# Patient Record
Sex: Male | Born: 1945 | Race: White | Hispanic: No | Smoking: Former smoker
Health system: Southern US, Community
[De-identification: ages and names within clinical notes are randomized; demographics above are authoritative.]

## PROBLEM LIST (undated history)

## (undated) DIAGNOSIS — F419 Anxiety disorder, unspecified: Secondary | ICD-10-CM

## (undated) DIAGNOSIS — E785 Hyperlipidemia, unspecified: Secondary | ICD-10-CM

## (undated) DIAGNOSIS — I739 Peripheral vascular disease, unspecified: Secondary | ICD-10-CM

## (undated) DIAGNOSIS — G629 Polyneuropathy, unspecified: Secondary | ICD-10-CM

## (undated) DIAGNOSIS — F1910 Other psychoactive substance abuse, uncomplicated: Secondary | ICD-10-CM

## (undated) DIAGNOSIS — I10 Essential (primary) hypertension: Secondary | ICD-10-CM

## (undated) DIAGNOSIS — L03115 Cellulitis of right lower limb: Secondary | ICD-10-CM

## (undated) DIAGNOSIS — J449 Chronic obstructive pulmonary disease, unspecified: Secondary | ICD-10-CM

## (undated) DIAGNOSIS — M545 Low back pain: Secondary | ICD-10-CM

## (undated) DIAGNOSIS — M109 Gout, unspecified: Secondary | ICD-10-CM

## (undated) DIAGNOSIS — D492 Neoplasm of unspecified behavior of bone, soft tissue, and skin: Secondary | ICD-10-CM

## (undated) DIAGNOSIS — IMO0001 Reserved for inherently not codable concepts without codable children: Secondary | ICD-10-CM

## (undated) DIAGNOSIS — K219 Gastro-esophageal reflux disease without esophagitis: Secondary | ICD-10-CM

## (undated) DIAGNOSIS — K759 Inflammatory liver disease, unspecified: Secondary | ICD-10-CM

## (undated) DIAGNOSIS — L03116 Cellulitis of left lower limb: Secondary | ICD-10-CM

## (undated) DIAGNOSIS — G8929 Other chronic pain: Secondary | ICD-10-CM

## (undated) DIAGNOSIS — J302 Other seasonal allergic rhinitis: Secondary | ICD-10-CM

## (undated) DIAGNOSIS — E119 Type 2 diabetes mellitus without complications: Secondary | ICD-10-CM

## (undated) DIAGNOSIS — R911 Solitary pulmonary nodule: Secondary | ICD-10-CM

## (undated) HISTORY — DX: Chronic obstructive pulmonary disease, unspecified: J44.9

## (undated) HISTORY — DX: Gastro-esophageal reflux disease without esophagitis: K21.9

## (undated) HISTORY — DX: Cellulitis of right lower limb: L03.115

## (undated) HISTORY — DX: Hyperlipidemia, unspecified: E78.5

## (undated) HISTORY — DX: Essential (primary) hypertension: I10

## (undated) HISTORY — DX: Other psychoactive substance abuse, uncomplicated: F19.10

## (undated) HISTORY — DX: Peripheral vascular disease, unspecified: I73.9

---

## 1965-10-27 DIAGNOSIS — K759 Inflammatory liver disease, unspecified: Secondary | ICD-10-CM

## 1965-10-27 HISTORY — DX: Inflammatory liver disease, unspecified: K75.9

## 1999-11-06 ENCOUNTER — Ambulatory Visit (HOSPITAL_COMMUNITY): Admission: RE | Admit: 1999-11-06 | Discharge: 1999-11-06 | Payer: Self-pay | Admitting: Internal Medicine

## 2000-01-06 ENCOUNTER — Emergency Department (HOSPITAL_COMMUNITY): Admission: EM | Admit: 2000-01-06 | Discharge: 2000-01-06 | Payer: Self-pay | Admitting: Emergency Medicine

## 2000-01-06 ENCOUNTER — Encounter: Payer: Self-pay | Admitting: Emergency Medicine

## 2000-10-27 DIAGNOSIS — E119 Type 2 diabetes mellitus without complications: Secondary | ICD-10-CM

## 2000-10-27 HISTORY — DX: Type 2 diabetes mellitus without complications: E11.9

## 2000-12-16 ENCOUNTER — Encounter: Admission: RE | Admit: 2000-12-16 | Discharge: 2001-01-29 | Payer: Self-pay | Admitting: Family Medicine

## 2001-06-23 ENCOUNTER — Ambulatory Visit (HOSPITAL_COMMUNITY): Admission: RE | Admit: 2001-06-23 | Discharge: 2001-06-23 | Payer: Self-pay | Admitting: Family Medicine

## 2001-09-07 ENCOUNTER — Ambulatory Visit (HOSPITAL_COMMUNITY): Admission: RE | Admit: 2001-09-07 | Discharge: 2001-09-07 | Payer: Self-pay | Admitting: *Deleted

## 2001-09-07 ENCOUNTER — Encounter: Payer: Self-pay | Admitting: *Deleted

## 2001-09-26 ENCOUNTER — Encounter: Payer: Self-pay | Admitting: *Deleted

## 2001-09-26 ENCOUNTER — Ambulatory Visit (HOSPITAL_COMMUNITY): Admission: RE | Admit: 2001-09-26 | Discharge: 2001-09-26 | Payer: Self-pay | Admitting: *Deleted

## 2002-03-22 DIAGNOSIS — E785 Hyperlipidemia, unspecified: Secondary | ICD-10-CM

## 2004-04-09 ENCOUNTER — Ambulatory Visit (HOSPITAL_COMMUNITY): Admission: RE | Admit: 2004-04-09 | Discharge: 2004-04-09 | Payer: Self-pay | Admitting: Internal Medicine

## 2004-07-29 ENCOUNTER — Ambulatory Visit: Payer: Self-pay | Admitting: *Deleted

## 2004-08-27 ENCOUNTER — Ambulatory Visit: Payer: Self-pay | Admitting: Family Medicine

## 2004-09-03 ENCOUNTER — Ambulatory Visit: Payer: Self-pay | Admitting: Family Medicine

## 2004-12-25 ENCOUNTER — Ambulatory Visit: Payer: Self-pay | Admitting: Family Medicine

## 2005-01-01 ENCOUNTER — Ambulatory Visit: Payer: Self-pay | Admitting: Family Medicine

## 2005-01-07 ENCOUNTER — Ambulatory Visit: Payer: Self-pay | Admitting: Family Medicine

## 2005-01-21 ENCOUNTER — Ambulatory Visit: Payer: Self-pay | Admitting: Family Medicine

## 2005-02-10 ENCOUNTER — Ambulatory Visit: Payer: Self-pay | Admitting: Family Medicine

## 2005-06-16 ENCOUNTER — Ambulatory Visit: Payer: Self-pay | Admitting: Family Medicine

## 2005-06-16 DIAGNOSIS — M545 Low back pain: Secondary | ICD-10-CM

## 2005-06-16 DIAGNOSIS — G8929 Other chronic pain: Secondary | ICD-10-CM | POA: Insufficient documentation

## 2005-09-04 ENCOUNTER — Ambulatory Visit: Payer: Self-pay | Admitting: Family Medicine

## 2005-11-05 ENCOUNTER — Ambulatory Visit: Payer: Self-pay | Admitting: Family Medicine

## 2006-02-09 ENCOUNTER — Ambulatory Visit: Payer: Self-pay | Admitting: Family Medicine

## 2006-03-02 ENCOUNTER — Ambulatory Visit: Payer: Self-pay | Admitting: Family Medicine

## 2006-05-20 ENCOUNTER — Ambulatory Visit: Payer: Self-pay | Admitting: Internal Medicine

## 2006-07-09 ENCOUNTER — Ambulatory Visit: Payer: Self-pay | Admitting: Family Medicine

## 2006-07-23 ENCOUNTER — Ambulatory Visit: Payer: Self-pay | Admitting: Family Medicine

## 2006-12-17 ENCOUNTER — Ambulatory Visit: Payer: Self-pay | Admitting: Family Medicine

## 2007-03-15 ENCOUNTER — Ambulatory Visit: Payer: Self-pay | Admitting: Family Medicine

## 2007-05-08 DIAGNOSIS — E118 Type 2 diabetes mellitus with unspecified complications: Secondary | ICD-10-CM

## 2007-05-08 DIAGNOSIS — I10 Essential (primary) hypertension: Secondary | ICD-10-CM

## 2007-05-08 DIAGNOSIS — M48061 Spinal stenosis, lumbar region without neurogenic claudication: Secondary | ICD-10-CM

## 2007-05-08 DIAGNOSIS — Z77090 Contact with and (suspected) exposure to asbestos: Secondary | ICD-10-CM | POA: Insufficient documentation

## 2007-05-08 DIAGNOSIS — E119 Type 2 diabetes mellitus without complications: Secondary | ICD-10-CM | POA: Insufficient documentation

## 2007-05-08 DIAGNOSIS — IMO0002 Reserved for concepts with insufficient information to code with codable children: Secondary | ICD-10-CM | POA: Insufficient documentation

## 2007-05-08 DIAGNOSIS — E669 Obesity, unspecified: Secondary | ICD-10-CM | POA: Insufficient documentation

## 2007-05-18 DIAGNOSIS — M109 Gout, unspecified: Secondary | ICD-10-CM

## 2007-06-03 ENCOUNTER — Ambulatory Visit: Payer: Self-pay | Admitting: Family Medicine

## 2007-08-03 ENCOUNTER — Ambulatory Visit: Payer: Self-pay | Admitting: Nurse Practitioner

## 2007-08-03 DIAGNOSIS — J449 Chronic obstructive pulmonary disease, unspecified: Secondary | ICD-10-CM | POA: Insufficient documentation

## 2007-08-03 LAB — CONVERTED CEMR LAB
Blood Glucose, Fingerstick: 155
Hgb A1c MFr Bld: 5.8 %

## 2007-10-11 ENCOUNTER — Telehealth (INDEPENDENT_AMBULATORY_CARE_PROVIDER_SITE_OTHER): Payer: Self-pay | Admitting: Family Medicine

## 2007-10-13 ENCOUNTER — Telehealth (INDEPENDENT_AMBULATORY_CARE_PROVIDER_SITE_OTHER): Payer: Self-pay | Admitting: Family Medicine

## 2007-10-19 ENCOUNTER — Telehealth (INDEPENDENT_AMBULATORY_CARE_PROVIDER_SITE_OTHER): Payer: Self-pay | Admitting: Family Medicine

## 2008-01-20 ENCOUNTER — Ambulatory Visit: Payer: Self-pay | Admitting: Family Medicine

## 2008-01-20 LAB — CONVERTED CEMR LAB
ALT: 37 units/L (ref 0–53)
AST: 33 units/L (ref 0–37)
BUN: 13 mg/dL (ref 6–23)
Basophils Absolute: 0 10*3/uL (ref 0.0–0.1)
Basophils Relative: 0 % (ref 0–1)
CO2: 29 meq/L (ref 19–32)
Cholesterol: 133 mg/dL (ref 0–200)
Creatinine, Ser: 0.85 mg/dL (ref 0.40–1.50)
Eosinophils Relative: 1 % (ref 0–5)
HCT: 43.3 % (ref 39.0–52.0)
HDL: 43 mg/dL (ref >39)
Hemoglobin: 14.6 g/dL (ref 13.0–17.0)
MCHC: 33.7 g/dL (ref 30.0–36.0)
Monocytes Absolute: 0.9 10*3/uL (ref 0.1–1.0)
RDW: 13.8 % (ref 11.5–15.5)
Total Bilirubin: 0.8 mg/dL (ref 0.3–1.2)
Total CHOL/HDL Ratio: 3.1
VLDL: 19 mg/dL (ref 0–40)

## 2008-01-24 ENCOUNTER — Encounter (INDEPENDENT_AMBULATORY_CARE_PROVIDER_SITE_OTHER): Payer: Self-pay | Admitting: Family Medicine

## 2008-01-31 ENCOUNTER — Ambulatory Visit: Payer: Self-pay | Admitting: Family Medicine

## 2008-01-31 DIAGNOSIS — K219 Gastro-esophageal reflux disease without esophagitis: Secondary | ICD-10-CM

## 2008-01-31 LAB — CONVERTED CEMR LAB: Blood Glucose, Fingerstick: 183

## 2008-03-31 ENCOUNTER — Ambulatory Visit: Payer: Self-pay | Admitting: Family Medicine

## 2008-03-31 DIAGNOSIS — L719 Rosacea, unspecified: Secondary | ICD-10-CM

## 2008-03-31 DIAGNOSIS — E1140 Type 2 diabetes mellitus with diabetic neuropathy, unspecified: Secondary | ICD-10-CM | POA: Insufficient documentation

## 2008-03-31 DIAGNOSIS — F1020 Alcohol dependence, uncomplicated: Secondary | ICD-10-CM | POA: Insufficient documentation

## 2008-04-10 ENCOUNTER — Encounter: Payer: Self-pay | Admitting: Family Medicine

## 2008-05-05 ENCOUNTER — Encounter: Payer: Self-pay | Admitting: Family Medicine

## 2008-05-12 ENCOUNTER — Ambulatory Visit: Payer: Self-pay | Admitting: Family Medicine

## 2008-05-12 ENCOUNTER — Encounter: Payer: Self-pay | Admitting: *Deleted

## 2008-05-15 ENCOUNTER — Ambulatory Visit: Payer: Self-pay | Admitting: Family Medicine

## 2008-07-18 ENCOUNTER — Telehealth: Payer: Self-pay | Admitting: *Deleted

## 2008-07-31 ENCOUNTER — Encounter (INDEPENDENT_AMBULATORY_CARE_PROVIDER_SITE_OTHER): Payer: Self-pay | Admitting: *Deleted

## 2008-07-31 ENCOUNTER — Ambulatory Visit: Payer: Self-pay | Admitting: Family Medicine

## 2008-08-14 ENCOUNTER — Ambulatory Visit: Payer: Self-pay | Admitting: Family Medicine

## 2008-09-12 ENCOUNTER — Telehealth: Payer: Self-pay | Admitting: Family Medicine

## 2008-09-14 ENCOUNTER — Telehealth (INDEPENDENT_AMBULATORY_CARE_PROVIDER_SITE_OTHER): Payer: Self-pay | Admitting: *Deleted

## 2008-10-17 ENCOUNTER — Telehealth: Payer: Self-pay | Admitting: *Deleted

## 2009-01-09 ENCOUNTER — Telehealth: Payer: Self-pay | Admitting: Family Medicine

## 2009-03-05 ENCOUNTER — Encounter: Payer: Self-pay | Admitting: Family Medicine

## 2009-03-29 ENCOUNTER — Ambulatory Visit: Payer: Self-pay | Admitting: Family Medicine

## 2009-05-30 ENCOUNTER — Ambulatory Visit: Payer: Self-pay | Admitting: Family Medicine

## 2009-05-30 ENCOUNTER — Encounter: Payer: Self-pay | Admitting: Family Medicine

## 2009-05-30 LAB — CONVERTED CEMR LAB
ALT: 33 units/L (ref 0–53)
AST: 37 units/L (ref 0–37)
Albumin: 4.5 g/dL (ref 3.5–5.2)
Alkaline Phosphatase: 58 units/L (ref 39–117)
BUN: 7 mg/dL (ref 6–23)
CO2: 24 meq/L (ref 19–32)
Calcium: 9 mg/dL (ref 8.4–10.5)
Chloride: 99 meq/L (ref 96–112)
Cholesterol: 155 mg/dL (ref 0–200)
Creatinine, Ser: 0.74 mg/dL (ref 0.40–1.50)
Glucose, Bld: 154 mg/dL — ABNORMAL HIGH (ref 70–99)
HDL: 41 mg/dL (ref >39)
LDL Cholesterol: 91 mg/dL (ref 0–99)
Potassium: 4.5 meq/L (ref 3.5–5.3)
Sodium: 140 meq/L (ref 135–145)
Total Bilirubin: 0.5 mg/dL (ref 0.3–1.2)
Total CHOL/HDL Ratio: 3.8
Total Protein: 7.1 g/dL (ref 6.0–8.3)
Triglycerides: 115 mg/dL (ref <150)
VLDL: 23 mg/dL (ref 0–40)

## 2009-05-31 ENCOUNTER — Encounter: Payer: Self-pay | Admitting: Family Medicine

## 2009-06-06 ENCOUNTER — Telehealth: Payer: Self-pay | Admitting: Family Medicine

## 2009-07-16 ENCOUNTER — Encounter: Payer: Self-pay | Admitting: Family Medicine

## 2009-09-13 ENCOUNTER — Encounter: Payer: Self-pay | Admitting: Family Medicine

## 2009-09-14 ENCOUNTER — Ambulatory Visit: Payer: Self-pay | Admitting: Family Medicine

## 2009-09-14 ENCOUNTER — Encounter: Payer: Self-pay | Admitting: Family Medicine

## 2009-09-14 LAB — CONVERTED CEMR LAB: PSA: 1.38 ng/mL (ref 0.10–4.00)

## 2009-09-16 ENCOUNTER — Encounter: Payer: Self-pay | Admitting: Family Medicine

## 2009-12-25 ENCOUNTER — Encounter: Payer: Self-pay | Admitting: Family Medicine

## 2010-01-16 ENCOUNTER — Encounter: Payer: Self-pay | Admitting: Family Medicine

## 2010-01-16 ENCOUNTER — Ambulatory Visit: Payer: Self-pay | Admitting: Family Medicine

## 2010-03-18 ENCOUNTER — Telehealth: Payer: Self-pay | Admitting: Family Medicine

## 2010-05-14 ENCOUNTER — Encounter: Payer: Self-pay | Admitting: Family Medicine

## 2010-06-27 ENCOUNTER — Ambulatory Visit: Payer: Self-pay | Admitting: Family Medicine

## 2010-06-27 LAB — CONVERTED CEMR LAB: Hgb A1c MFr Bld: 7.7 %

## 2010-08-19 ENCOUNTER — Telehealth: Payer: Self-pay | Admitting: Family Medicine

## 2010-09-17 ENCOUNTER — Ambulatory Visit: Payer: Self-pay | Admitting: Family Medicine

## 2010-09-17 ENCOUNTER — Encounter: Payer: Self-pay | Admitting: Family Medicine

## 2010-09-17 LAB — CONVERTED CEMR LAB
Albumin/Creatinine Ratio, Urine, POC: ABNORMAL
Creatinine,U: 50 mg/dL
Hgb A1c MFr Bld: 8.4 %
Microalbumin U total vol: 10 mg/L

## 2010-11-21 ENCOUNTER — Encounter (INDEPENDENT_AMBULATORY_CARE_PROVIDER_SITE_OTHER): Payer: Self-pay | Admitting: *Deleted

## 2010-11-26 NOTE — Assessment & Plan Note (Signed)
Summary: f/u,df   Vital Signs:  Patient profile:   65 year old male Height:      65 inches Weight:      229 pounds BMI:     38.25 Temp:     98.0 degrees F oral Pulse rate:   69 / minute BP sitting:   145 / 79  (left arm) Cuff size:   regular  Vitals Entered By: Levert Feinstein LPN (November 22, 624THL 1:42 PM) CC: f/u dm Is Patient Diabetic? Yes Did you bring your meter with you today? No Pain Assessment Patient in pain? no        Primary Couper [place]:  Boykin Nearing MD  CC:  f/u dm.  History of Present Illness: Here to f/u diabetes. Does not check CBGs, has not had a meter for last 2-3 years.  Taking all medications as prescribed.   DM- Taking metformin and glipizide as prescribed.  ROS: polyuria 2/2 diuretic use. Admits to chest pain yesterday, L side under L breast, lasting 20 minutes, sharp, no N/V, dizziness or feeling lightheaded, no sweating.  Denies polydipsia, fatigue, blurred vision, tingling or numbess in hand and feet.   HTN- BPs elevated today. Pt taking medications as prescribed. No pain currently. No CP, HA, SOB, blurred vision.   Obesity- 229 lbs. Weight trending up. Pt rather sedentary. He spends most of his time at the bar. He drinls 8 (12 oz.) beers/day, 5 days/wk. He reports drinking light beer. His only exercise is walking to and from the parking lot. He often skips dinner b/c of lack of appetite. He has been eating cookies quite often lately.    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 1 Two Times A Day 2)  Glipizide 10 Mg  Tabs (Glipizide) .... Take 1 Tablet By Mouth Every Morning 3)  Allopurinol 300 Mg  Tabs (Allopurinol) .... Once Daily 4)  Furosemide 20 Mg  Tabs (Furosemide) .... Take 1 Tablet By Mouth Once A Day 5)  Simvastatin 80 Mg Tabs (Simvastatin) .Marland Kitchen.. 1 Tab By Mouth Daily For Cholesterol 6)  Norvasc 10 Mg Tabs (Amlodipine Besylate) .... One Tab Daily. 7)  Hydrocodone-Acetaminophen 10-500 Mg  Tabs (Hydrocodone-Acetaminophen) .Marland Kitchen.. 1 Tab By Mouth Three Times A Day As Needed For Back Pain. 8)  Ventolin Hfa 108 (90 Base) Mcg/act Aers (Albuterol Sulfate) .... 2 Puffs Every 4 Hours As Needed 9)  Advair Diskus 250-50 Mcg/dose  Misc (Fluticasone-Salmeterol) .Marland Kitchen.. 1 Puff Two Times A Day 10)  Metoprolol Tartrate 100 Mg Tabs (Metoprolol Tartrate) .... Take 2 Tablets By Mouth Two Times A Day 11)  Nexium 40 Mg Cpdr (Esomeprazole Magnesium) .Marland Kitchen.. 1 Tab By Mouth Daily 12)  Losartan Potassium 50 Mg Tabs (Losartan Potassium) .Marland Kitchen.. 1 By Mouth Daily For Blood Pressure.  Take Instead of The Avapro  Allergies: No Known Drug Allergies  Social History: Customer service manager. Widowed. 5-8 years education Rents home, owns a car  >6 beers/day Likes to fish Pets:  4 cockatiels and 2 lovebirds  Review of Systems       As per HPI  Physical Exam  General:  A&Ox3, NAD, obese, pleasant Lungs:  decreased air movement, wheezes throughout.  Heart:  normal rate and regular rhythm.    Diabetes Management Exam:    Foot Exam (with socks and/or shoes not present):       Sensory-Pinprick/Light touch:          Left medial foot (L-4): diminished           Left dorsal foot (L-5): normal          Left lateral foot (S-1): normal          Right medial foot (L-4): diminished          Right dorsal foot (L-5): normal          Right lateral foot (S-1): normal       Sensory-Monofilament:          Left foot: diminished          Right foot: diminished       Inspection:          Left foot: normal          Right foot: normal       Nails:          Left foot: thickened          Right foot: thickened    Impression & Recommendations:  Problem # 1:  DM W/COMPLICATION NOS, TYPE II (ICD-250.90) Assessment Deteriorated A1c elevated and trending up. Discussed the need to start insulin if trend continues. Pt strongly wants to avoid insulin. See pt instructions for plan. Referral to Ophthalmology and Podiatry. His updated medication list for this problem includes:    Metformin Hcl 1000 Mg Tabs (Metformin hcl) .Marland Kitchen... 1 two times a day    Glipizide 10 Mg Tabs (Glipizide) .Marland Kitchen... Take 1 tablet by mouth every morning    Losartan Potassium 50 Mg Tabs (Losartan potassium) .Marland Kitchen... 1 by mouth daily for blood pressure.  take instead of the avapro    Aspirin 81 Mg Tbec (Aspirin) ..... One tab q day  Orders: A1C-[facility] [id number]) UA Microalbumin-[facility] [id number]) Creatinine-[facility] [id number]) TSH-[facility] [id number]) [place]- Est  Level 4 [id number]) Podiatry Referral (Podiatry) Ophthalmology Referral (Ophthalmology)Future Orders: Lipid-[facility] [id number]) ... 08/28/2011  Problem # 2:  HYPERTENSION (ICD-401.9) Assessment: Deteriorated Elevated today compared to previous recent visits.  Do not plan to change medications today. See pt instructions for plan, lifestyle modification with diet and exercise.  His updated medication list for this problem includes:    Furosemide 20 Mg Tabs (Furosemide) .Marland Kitchen... Take 1 tablet by mouth once a day    Norvasc 10 Mg Tabs (Amlodipine besylate) ..... One tab daily.    Metoprolol Tartrate 100 Mg Tabs (Metoprolol tartrate) .Marland Kitchen... Take 2 tablets by mouth  two times a day    Losartan Potassium 50 Mg Tabs (Losartan potassium) .Marland Kitchen... 1 by mouth daily for blood pressure.  take instead of the avapro  Orders: [place]- Est  Level 4 [id number])  Problem # 3:  OBESITY (ICD-278.00) See pt  instructions for plan. Lifestyle modifications (diet changes, increase activity). Also encouraged pt to schedule an appointment with Dr. Jenne Campus.  Orders: [place]- Est  Level 4 [id number])  Complete Medication List: 1)  Metformin Hcl 1000 Mg Tabs (Metformin hcl) .Marland Kitchen.. 1 two times a day 2)  Glipizide 10 Mg Tabs (Glipizide) .... Take 1 tablet by mouth every morning 3)  Allopurinol 300 Mg Tabs (Allopurinol) .... Once daily 4)  Furosemide 20 Mg Tabs (Furosemide) .... Take 1 tablet by mouth once a day 5)  Simvastatin 80 Mg Tabs (Simvastatin) .Marland Kitchen.. 1 tab by mouth daily for cholesterol 6)  Norvasc 10 Mg Tabs (Amlodipine besylate) .... One tab daily. 7)  Hydrocodone-acetaminophen 10-500 Mg Tabs (Hydrocodone-acetaminophen) .Marland Kitchen.. 1 tab by mouth three times a day as needed for back pain. 8)  Ventolin Hfa 108 (90 Base) Mcg/act Aers (Albuterol sulfate) .... 2 puffs every 4 hours as needed 9)  Advair Diskus 250-50 Mcg/dose Misc (Fluticasone-salmeterol) .Marland Kitchen.. 1 puff two times a day 10)  Metoprolol Tartrate 100 Mg Tabs (Metoprolol tartrate) .... Take 2 tablets by mouth two times a day 11)  Nexium 40 Mg Cpdr (Esomeprazole magnesium) .Marland Kitchen.. 1 tab by mouth daily 12)  Losartan Potassium 50 Mg Tabs (Losartan potassium) .Marland Kitchen.. 1 by mouth daily for blood pressure.  take instead of the avapro 13)  Aspirin 81 Mg Tbec (Aspirin) .... One tab q day  Other Orders: Flu Vaccine 89yrs + [id number]) Admin 1st Vaccine [id number])  Patient Instructions: 1)  Mr. Torell. 2)  Thank you for coming in today. 3)  It was good to see you again. 4)  I will refill your medications today. 5)  For yout diabetes control: 6)  1. cut out cookies: 7)  2. Increase activity:  8)  I know you want to avoid insulin. To do that you have to  continue to take your medications as prescribed, eat regular (3-4 meals), increase activity. 9)  I believe you will benefit from seeing the excellent nutritionist here, Freddrick March please schedule an appointment with her when you leave. 10)  Referrals: Ophthalmology to check your eyes. Podiatry to check your feet. You will be called with appointment times.  11)  Please schedule a follow-up appointment in 2 months. 12)  Have a happy Holiday, 13)  Dr. [name] Blackwater    Orders Added: 1)  A1C-[facility] [83036] 2)  Lipid-[facility] [80061-22930] 3)  UA Microalbumin-[facility] [82044] 4)  Creatinine-[facility] [id number] 5)  TSH-[facility] [id number] 6)  Flu Vaccine 68yrs + [id number] 7)  Admin 1st Vaccine [90471] 8)  [place]- Est  Level 4 [id number] 9)  Podiatry Referral [Podiatry] 10)  Ophthalmology Referral [Ophthalmology]   Immunizations Administered:  Influenza Vaccine # 1:    Vaccine Type: Fluvax 3+    Site: left deltoid    Mfr: GlaxoSmithKline    Dose: 0.5 ml    Route: IM    Given by: Levert Feinstein LPN    Exp. Date: 04/23/2011    Lot #: [id number]    VIS given: 05/21/10 version given September 17, 2010.  Flu Vaccine Consent Questions:    Do you have a history of severe allergic reactions to this vaccine? no    Any prior history of allergic reactions to egg and/or gelatin? no    Do you have a sensitivity to the preservative Thimersol? no    Do you have a past history of Guillan-Barre Syndrome? no    Do you currently have an acute febrile illness? no    Have you ever had a  severe reaction to latex? no    Vaccine information given and explained to patient? yes   Immunizations Administered:  Influenza Vaccine # 1:    Vaccine Type: Fluvax 3+    Site: left deltoid    Mfr: GlaxoSmithKline    Dose: 0.5 ml    Route: IM    Given by: Levert Feinstein LPN    Exp. Date: 04/23/2011    Lot #: [id number]    VIS given: 05/21/10 version given September 17, 2010.  Laboratory Results   Urine Tests  Date/Time Received: September 17, 2010 2:46 PM  Date/Time Reported: September 17, 2010 3:22 PM   Microalbumin (urine): 10 mg/L Creatinine: 50mg /dL  A:C Ratio 30-300 Abnormal Comments: ...........test performed by...........Marland KitchenHedy Camara, CMA   Blood Tests   Date/Time Received: September 17, 2010 1:34 PM  Date/Time Reported: September 17, 2010 1:54 PM   HGBA1C: 8.4%   (Normal Range: Non-Diabetic - 3-6%   Control Diabetic - 6-8%)  Comments: ...........test performed by...........Marland KitchenLevert Feinstein, LPN entered by Hedy Camara, CMA

## 2010-11-26 NOTE — Progress Notes (Signed)
Summary: refill  Phone Note Refill Request Call back at Home Phone [phone] Message from:  Patient  Refills Requested: Medication #1:  HYDROCODONE-ACETAMINOPHEN 10-500 MG  TABS 1 tab by mouth three times a day as needed for back pain. Initial call taken by: Audie Clear,  August 19, 2010 10:33 AM    Prescriptions: HYDROCODONE-ACETAMINOPHEN 10-500 MG  TABS (HYDROCODONE-ACETAMINOPHEN) 1 tab by mouth three times a day as needed for back pain.  #90 x 3   Entered by:   Marcell Barlow RN   Authorized by:   Boykin Nearing MD   Signed by:   Marcell Barlow RN on 08/21/2010   Method used:   Telephoned to ...       [facility] (retail)       [place], [place]  [id number]       Ph: [phone]       Fax: [phone]   [id number]  paged Dr. [name] Blackwater and she gave ok to fill this medication for patient . he came to office today to ask about refill. RX called to pharmacy. patient has appointment scheduled 09/17/2010 with Dr. [name] Blackwater. Marcell Barlow RN  August 21, 2010 12:30 PM

## 2010-11-26 NOTE — Assessment & Plan Note (Signed)
Summary: f/u meds/eo   Vital Signs:  Patient profile:   65 year old male Height:      65 inches Weight:      227 pounds BMI:     37.91 BSA:     2.09 Temp:     98.3 degrees F Pulse rate:   69 / minute BP sitting:   115 / 70  Vitals Entered By: Christen Bame CMA (January 16, 2010 8:35 AM) CC: f/u meds Is Patient Diabetic? Yes Did you bring your meter with you today? No Pain Assessment Patient in pain? no        Primary Care Provider:  Graciella Belton MD  CC:  f/u meds.  History of Present Illness: 65 yo male here for f/u of pain management and:  PAIN MANAGEMENT.  Takes Vicodin three times a day chronically.  Pain is well controlled on this regimen, though he does assert that pain is worse in the morning when he wakes up.  Currently 0/10.  DM.  Taking meds as prescribed.  No polydipsia, some polyuria.  Historically well-controlled.  HTN / HLD.  No chest pain, dyspnea, LE edema.  Taking meds as prescribed.  GOUT.  No c/o.  COPD.  Wheezes a little in the morning, otherwise well.  Takes Advair as prescribed.  Stopped using Spiriva.  Habits & Providers  Alcohol-Tobacco-Diet     Tobacco Status: never  Allergies (verified): No Known Drug Allergies  Review of Systems       Per HPI.  Physical Exam  Additional Exam:  VITALS:  Reviewed, normal GEN: Alert & oriented, no acute distress, obese CARDIO: Regular rate and rhythm, no murmurs/rubs/gallops, 2+ bilateral radial pulses RESP: Mild bilateral wheeze, normal work of breathing, no retractions/accessory muscle use EXT: Nontender, no edema    Impression & Recommendations:  Problem # 1:  LOW BACK PAIN (ICD-724.2) Assessment Improved  Meds refilled today.  Pain contract signed today. His updated medication list for this problem includes:    Hydrocodone-acetaminophen 10-500 Mg Tabs (Hydrocodone-acetaminophen) .Marland Kitchen... 1 tab by mouth three times a day as needed for back pain.  Orders: [place]- Est  Level 4  [id number])  Problem # 2:  DM W/COMPLICATION NOS, TYPE II (ICD-250.90) Assessment: Improved Check A1c today. His updated medication list for this problem includes:    Metformin Hcl 1000 Mg Tabs (Metformin hcl) .Marland Kitchen... 1 two times a day    Glipizide 10 Mg Tabs (Glipizide) .Marland Kitchen... Take 1 tablet by mouth every morning    Avapro 150 Mg Tabs (Irbesartan) .Marland Kitchen... Take 1 tablet by mouth every morning  Orders: A1C-[facility] [id number]) [place]- Est  Level 4 [id number])  Labs Reviewed: Creat: 0.74 (05/30/2009)   Microalbumin: 1+ (08/14/2008)  Last Eye Exam: normal (04/10/2008) Reviewed HgBA1c results: 6.6 (09/14/2009)  6.2 (03/29/2009)  Problem # 3:  HYPERLIPIDEMIA (ICD-272.4) Assessment: Unchanged  His updated medication list for this problem includes:    Simvastatin 80 Mg Tabs (Simvastatin) .Marland Kitchen... 1 tab by mouth daily for cholesterol  Problem # 4:  COPD (ICD-496) Assessment: Improved  The following medications were removed from the medication list:    Spiriva Handihaler 18 Mcg Caps (Tiotropium bromide monohydrate) .Marland Kitchen... 1 capsule  once a day His updated medication list for this problem includes:    Ventolin Hfa 108 (90 Base) Mcg/act Aers (Albuterol sulfate) .Marland Kitchen... 2 puffs every 4 hours as needed    Advair Diskus 250-50 Mcg/dose Misc (Fluticasone-salmeterol) .Marland Kitchen... 1 puff two times a day  Problem # 5:  GOUT (ICD-274.9) Assessment:  Improved  No symptoms. His updated medication list for this problem includes:    Allopurinol 300 Mg Tabs (Allopurinol) ..... Once daily  Orders: [place]- Est  Level 4 [id number])  Complete Medication List: 1)  Metformin Hcl 1000 Mg Tabs (Metformin hcl) .Marland Kitchen.. 1 two times a day 2)  Glipizide 10 Mg Tabs (Glipizide) .... Take 1 tablet by mouth every morning 3)  Allopurinol 300 Mg Tabs (Allopurinol) .... Once daily 4)  Furosemide 20 Mg Tabs (Furosemide) .... Take 1 tablet by mouth once a day 5)  Avapro 150 Mg Tabs (Irbesartan) .... Take 1 tablet by mouth every morning 6)  Simvastatin 80 Mg  Tabs (Simvastatin) .Marland Kitchen.. 1 tab by mouth daily for cholesterol 7)  Norvasc 5 Mg Tabs (Amlodipine besylate) .... Take 1 tablet by mouth once a day 8)  Hydrocodone-acetaminophen 10-500 Mg Tabs (Hydrocodone-acetaminophen) .Marland Kitchen.. 1 tab by mouth three times a day as needed for back pain. 9)  Ventolin Hfa 108 (90 Base) Mcg/act Aers (Albuterol sulfate) .... 2 puffs every 4 hours as needed 10)  Advair Diskus 250-50 Mcg/dose Misc (Fluticasone-salmeterol) .Marland Kitchen.. 1 puff two times a day 11)  Metoprolol Tartrate 100 Mg Tabs (Metoprolol tartrate) .... Take 2 tablets by mouth two times a day 12)  Nexium 40 Mg Cpdr (Esomeprazole magnesium) .Marland Kitchen.. 1 tab by mouth daily  Patient Instructions: 1)  Pleasure to see you today. 2)  I've refilled your pain medications today. 3)  Please schedule a follow-up appointment in 3 months .  Prescriptions: HYDROCODONE-ACETAMINOPHEN 10-500 MG  TABS (HYDROCODONE-ACETAMINOPHEN) 1 tab by mouth three times a day as needed for back pain.  #90 x 3   Entered and Authorized by:   Graciella Belton MD   Signed by:   Graciella Belton MD on 01/16/2010   Method used:   Print then Give to Patient   RxID:   [id number] ADVAIR DISKUS 250-50 MCG/DOSE  MISC (FLUTICASONE-SALMETEROL) 1 puff two times a day  #60 x 3   Entered and Authorized by:   Graciella Belton MD   Signed by:   Graciella Belton MD on 01/16/2010   Method used:   Electronically to        [facility] (retail)       [place], [place]  [id number]       Ph: [phone]       Fax: [phone]   [id number] VENTOLIN HFA 108 (90 BASE) MCG/ACT AERS (ALBUTEROL SULFATE) 2 puffs every 4 hours as needed  #1 x 3   Entered and Authorized by:   Graciella Belton MD   Signed by:   Graciella Belton MD on 01/16/2010   Method used:   Electronically to        [facility] (retail)       [address]       [place], [place]  [id number]       Ph: [phone]       Fax:  [phone]   RxID:   [id number]   Appended Document: A1c  7.2%    Lab Visit  Laboratory Results   Blood Tests   Date/Time Received: January 16, 2010 8:31 AM  Date/Time Reported: January 16, 2010 9:17 AM   HGBA1C: 7.2%   (Normal Range: Non-Diabetic - 3-6%   Control Diabetic - 6-8%)  Comments: ............test performed by...........Marland Kitchen Hedy Camara, CMA .............entered by...........Marland KitchenBonnie A. Martinique, MLS (ASCP)cm     Orders  Today:   Appended Document: f/u meds/eo    Prescriptions: METFORMIN HCL 1000 MG  TABS (METFORMIN HCL) 1 two times a day  #60 x 0   Entered by:   Elige Radon RN   Authorized by:   Boykin Nearing MD   Signed by:   Elige Radon RN on 06/12/2010   Method used:   Electronically to        [facility] (retail)       [place], [place]  [id number]       Ph: [phone]       Fax: [phone]   RxID:   [id number] GLIPIZIDE 10 MG  TABS (GLIPIZIDE) Take 1 tablet by mouth every morning  #30 Tablet x 0   Entered by:   Elige Radon RN   Authorized by:   Boykin Nearing MD   Signed by:   Elige Radon RN on 06/12/2010   Method used:   Electronically to        [facility] (retail)       [place], [place]  [id number]       Ph: [phone]       Fax: [phone]   RxID:   [id number] ALLOPURINOL 300 MG  TABS (ALLOPURINOL) once daily  #30 Tablet x 0   Entered by:   Elige Radon RN   Authorized by:   Boykin Nearing MD   Signed by:   Elige Radon RN on 06/12/2010   Method used:   Electronically to        [facility] (retail)       [place], [place]  [id number]       Ph: [phone]       Fax: [phone]   RxID:   [id number]

## 2010-11-26 NOTE — Miscellaneous (Signed)
Summary: [place] Controlled Substance Contract  [place] Controlled Substance Contract   Imported By: Raymond Gurney 01/17/2010 11:08:56  _____________________________________________________________________  External Attachment:    Type:   Image     Comment:   External Document

## 2010-11-26 NOTE — Progress Notes (Signed)
Summary: refill  Phone Note Refill Request Call back at Home Phone [phone] Message from:  Patient  Refills Requested: Medication #1:  NEXIUM 40 MG CPDR 1 tab by mouth daily. Initial call taken by: Audie Clear,  Mar 18, 2010 9:05 AM    Prescriptions: NEXIUM 40 MG CPDR (ESOMEPRAZOLE MAGNESIUM) 1 tab by mouth daily  #30 x 1   Entered and Authorized by:   Graciella Belton MD   Signed by:   Graciella Belton MD on 03/19/2010   Method used:   Electronically to        [facility] (retail)       [place], [place]  [id number]       Ph: [phone]       Fax: [phone]   RxID:   [id number]

## 2010-11-26 NOTE — Letter (Signed)
Summary: Pain Contract Letter  [facility]  [address]   [place], [place] [address]   Phone: [phone]  Fax: [phone]    12/25/2009  Leslie [name] [address] [place], [place]  [address]  Dear Mr. Fauth,  I will need to see you in clinic again before I refill your pain medicine.  I have a policy of having all of my patients who are on chronic narcotic pain medicines to sign a pain contract.  We will need to do this at your next visit.  No more refills until seen in clinic and we sign a pain contract.  Sincerely, Graciella Belton MD  Appended Document: Pain Contract Letter mailed.

## 2010-11-26 NOTE — Miscellaneous (Signed)
Summary: irbesartan changed to losartan  Clinical Lists Changes Changed med from irbesartan to losartan for generic availability.  Sarah Martinique MD  May 14, 2010 12:33 PM  Medications: Removed medication of AVAPRO 150 MG  TABS (IRBESARTAN) Take 1 tablet by mouth every morning Added new medication of LOSARTAN POTASSIUM 50 MG TABS (LOSARTAN POTASSIUM) 1 by mouth daily for blood pressure.  Take instead of the avapro - Signed Rx of LOSARTAN POTASSIUM 50 MG TABS (LOSARTAN POTASSIUM) 1 by mouth daily for blood pressure.  Take instead of the avapro;  #90 x 0;  Signed;  Entered by: Sarah Martinique MD;  Authorized by: Sarah Martinique MD;  Method used: Electronically to [facility] [place], [place], [place]  [id number], Ph: [phone], Fax: [phone]    Prescriptions: LOSARTAN POTASSIUM 50 MG TABS (LOSARTAN POTASSIUM) 1 by mouth daily for blood pressure.  Take instead of the avapro  #90 x 0   Entered and Authorized by:   Sarah Martinique MD   Signed by:   Sarah Martinique MD on 05/14/2010   Method used:   Electronically to        [facility] (retail)       [address]       [place], [place]  [id number]       Ph: [phone]       Fax: [phone]   RxID:   [id number]

## 2010-11-26 NOTE — Assessment & Plan Note (Signed)
Summary: follow up/ls   Vital Signs:  Patient profile:   65 year old male Height:      65 inches Weight:      224 pounds BMI:     37.41 BSA:     2.08 Temp:     98.7 degrees F Pulse rate:   71 / minute BP sitting:   149 / 75  Vitals Entered By: Christen Bame CMA (June 27, 2010 1:37 PM)  Serial Vital Signs/Assessments:  Time      Position  BP       Pulse  Resp  Temp     By                     159/90                         Boykin Nearing MD  CC: Meet NEw MD Is Patient Diabetic? Yes Did you bring your meter with you today? No Pain Assessment Patient in pain? no        Primary Provider:  Graciella Belton MD  CC:  Meet NEw MD.  History of Present Illness: Mr. Hanko is here for his prescription refills and to discuss his diabetes. He has no complaints.   We reviewed his prescriptions. He takes them all as presribed.  Regarding his diabetes, he does not monitor his blood sugar at home. He tries to maintain a well- balance diet. He does not eat fried foods. 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 He walks for exercise, but is limited by the pain in his back and hips. We discussed exercises he can do while sitting. He has neuropathy in his lower extremities. He has decreased sensation in his feet b/l. He trims his toe nails. We discussed him checking his feet over a mirror to look for sores. He denies change in vision. He also denies N/V,  and GI pain.   Regarding his BP. He took his BP medication this AM. He denis HA,  chest pain, dizziness, SOB. He does not monitor BPs at home.   Regarding hyprlipidemia. As previously stated, he has cut out fried foods. We discussed the benefits of exercising and maintaining a low fat diet. He denies visual disturbances and chest pain.  Regarding his chronic pain. He takes Percocet one tab 3x/day. His back and hip pain limits his mobility. He utilizes the Financial trader when shopping at [employer]. We discussed the importance of hip girdle  exercises. I demonstrated some exercises I would like him to do while holding on to a chair.    Habits & Providers  Alcohol-Tobacco-Diet     Alcohol drinks/day: 4+     Alcohol type: beer     Tobacco Status: quit     Year Quit: 2003     Passive Smoke Exposure: no  Exercise-Depression-Behavior     Exercise Counseling: to improve exercise regimen  Allergies: No Known Drug Allergies  Social History: Smoking Status:  quit  Review of Systems       As per HPI.  Physical Exam  General:  Well-developed,well-nourished,in no acute distress; alert,appropriate and cooperative throughout examination Nose:  external deformity.  bulbous lesion on hanging from R nares.  Lungs:  decreased air movement, wheezes throughout.  Heart:  normal rate and regular rhythm.   Abdomen:  abdomen soft and non-tender without masses, organomegaly or hernias noted. Extremities:  small  38mm warts on dorsal surface of b/l  feet. No redness or swelling noted. Nontender. All toes b/l feet with  onychomycosis.  No macertation between toes. No ulcers or sores on soles of feet. 2+ dp pulses.  No edema of foot.  No streaking up foot.     Impression & Recommendations:  Problem # 1:  DM W/COMPLICATION NOS, TYPE II (ICD-250.90)  His updated medication list for this problem includes:    Metformin Hcl 1000 Mg Tabs (Metformin hcl) .Marland Kitchen... 1 two times a day    Glipizide 10 Mg Tabs (Glipizide) .Marland Kitchen... Take 1 tablet by mouth every morning    Losartan Potassium 50 Mg Tabs (Losartan potassium) .Marland Kitchen... 1 by mouth daily for blood pressure.  take instead of the avapro  Orders: A1C-[facility] (83036)Future Orders: Lipid-[facility] [id number]) ... 06/28/2011  Problem # 2:  HYPERLIPIDEMIA (ICD-272.4)  His updated medication list for this problem includes:    Simvastatin 80 Mg Tabs (Simvastatin) .Marland Kitchen... 1 tab by mouth daily for cholesterol  Orders: [place]- Est  Level 4 (99214)Future Orders: Lipid-[facility] [id number]) ... 06/28/2011 Comp Met-[facility]  [id number]) ... 06/28/2011  Problem # 3:  HYPERTENSION (ICD-401.9)  His updated medication list for this problem includes:    Furosemide 20 Mg Tabs (Furosemide) .Marland Kitchen... Take 1 tablet by mouth once a day    Norvasc 5 Mg Tabs (Amlodipine besylate) .Marland Kitchen... Take 1 tablet by mouth once a day    Metoprolol Tartrate 100 Mg Tabs (Metoprolol tartrate) .Marland Kitchen... Take 2 tablets by mouth two times a day    Losartan Potassium 50 Mg Tabs (Losartan potassium) .Marland Kitchen... 1 by mouth daily for blood pressure.  take instead of the avapro  Orders: [facility]- Est  Level 4 (99214)Future Orders: Lipid-[facility] [id number]) ... 06/28/2011  Problem # 4:  SMOKELESS TOBACCO ABUSE (ICD-305.1) Assessment: Improved Pt quit smokeless tobacco 6 mos ago.   Problem # 5:  LOW BACK PAIN (ICD-724.2)  His updated medication list for this problem includes:    Hydrocodone-acetaminophen 10-500 Mg Tabs (Hydrocodone-acetaminophen) .Marland Kitchen... 1 tab by mouth three times a day as needed for back pain.  We will discuss his low back pain in more detail at his next appointment. At that time we will explore alternative pain control options.   Orders: [place]- Est  Level 4 [id number])  Complete Medication List: 1)  Metformin Hcl 1000 Mg Tabs (Metformin hcl) .Marland Kitchen.. 1 two times a day 2)  Glipizide 10 Mg Tabs (Glipizide) .... Take 1 tablet by mouth every morning 3)  Allopurinol 300 Mg Tabs (Allopurinol) .... Once daily 4)  Furosemide 20 Mg Tabs (Furosemide) .... Take 1 tablet by mouth once a day 5)  Simvastatin 80 Mg Tabs (Simvastatin) .Marland Kitchen.. 1 tab by mouth daily for cholesterol 6)  Norvasc 5 Mg Tabs (Amlodipine besylate) .... Take 1 tablet by mouth once a day 7)  Hydrocodone-acetaminophen 10-500 Mg Tabs (Hydrocodone-acetaminophen) .Marland Kitchen.. 1 tab by mouth three times a day as needed for back pain. 8)  Ventolin Hfa 108 (90 Base) Mcg/act Aers (Albuterol sulfate) .... 2 puffs every 4 hours as needed 9)  Advair Diskus 250-50 Mcg/dose Misc (Fluticasone-salmeterol) .Marland Kitchen.. 1 puff  two times a day 10)  Metoprolol Tartrate 100 Mg Tabs (Metoprolol tartrate) .... Take 2 tablets by mouth two times a day 11)  Nexium 40 Mg Cpdr (Esomeprazole magnesium) .Marland Kitchen.. 1 tab by mouth daily 12)  Losartan Potassium 50 Mg Tabs (Losartan potassium) .Marland Kitchen.. 1 by mouth daily for blood pressure.  take instead of the avapro  Patient Instructions: 1)  Leslie [name]. It was a pleasure meeting  you today. 2)  Thank you for coming in. 3)  I will refill your medications today. 4)  Please schedule a follow-up appointment in 2 months. 5)  Please stop the 5 mg Amlodipine and take the 10 mg Amlodipine.  6)  Please return for a FASTING Lipid Profile and CMP in 8 weeks. 7)  Check your Blood Pressure regularly. If it is above:140/90 you should make an appointment. 8)  Check your feet each night for sore areas, calluses or signs of infection. 9)  It is important that you exercise regularly at least 20 minutes 5 times a week. If you develop chest pain, have severe difficulty breathing, or feel very tired , stop exercising immediately and seek medical attention. Prescriptions: LOSARTAN POTASSIUM 50 MG TABS (LOSARTAN POTASSIUM) 1 by mouth daily for blood pressure.  Take instead of the avapro  #90 x 3   Entered and Authorized by:   Boykin Nearing MD   Signed by:   Boykin Nearing MD on 06/27/2010   Method used:   Electronically to        [facility] (retail)       [place], [place]  [id number]       Ph: [phone]       Fax: [phone]   [id number] NEXIUM 40 MG CPDR (ESOMEPRAZOLE MAGNESIUM) 1 tab by mouth daily  #30 Capsule x 3   Entered and Authorized by:   Boykin Nearing MD   Signed by:   Boykin Nearing MD on 06/27/2010   Method used:   Electronically to        [facility] (retail)       [place], [place]  [id number]       Ph: [phone]       Fax: [phone]   [id number] METOPROLOL  TARTRATE 100 MG TABS (METOPROLOL TARTRATE) Take 2 tablets by mouth two times a day  #120 Tablet x 3   Entered and Authorized by:   Boykin Nearing MD   Signed by:   Boykin Nearing MD on 06/27/2010   Method used:   Electronically to        [facility] (retail)       [place], [place]  [id number]       Ph: [phone]       Fax: [phone]   [id number] SIMVASTATIN 80 MG TABS (SIMVASTATIN) 1 tab by mouth daily for cholesterol  #30 x 3   Entered and Authorized by:   Boykin Nearing MD   Signed by:   Boykin Nearing MD on 06/27/2010   Method used:   Electronically to        [facility] (retail)       [place], [place]  [id number]       Ph: [phone]       Fax: [phone]   [id number] FUROSEMIDE 20 MG  TABS (FUROSEMIDE) Take 1 tablet by mouth once a day  #34 Tablet x 3   Entered and Authorized by:   Boykin Nearing MD   Signed by:   Boykin Nearing MD on 06/27/2010   Method used:   Electronically to  [facility] (retail)       [place]       [place], [place]  [id number]       Ph: [phone]       Fax: [phone]   RxID:   [id number] ALLOPURINOL 300 MG  TABS (ALLOPURINOL) once daily  #30 Tablet x 3   Entered and Authorized by:   Boykin Nearing MD   Signed by:   Boykin Nearing MD on 06/27/2010   Method used:   Electronically to        [facility] (retail)       [place], [place]  [id number]       Ph: [phone]       Fax: [phone]   [id number] GLIPIZIDE 10 MG  TABS (GLIPIZIDE) Take 1 tablet by mouth every morning  #30 Tablet x 3   Entered and Authorized by:   Boykin Nearing MD   Signed by:   Boykin Nearing MD on 06/27/2010   Method used:   Electronically to        [facility] (retail)       [place],  [place]  [id number]       Ph: [phone]       Fax: [phone]   [id number] METFORMIN HCL 1000 MG  TABS (METFORMIN HCL) 1 two times a day  #60 x 3   Entered and Authorized by:   Boykin Nearing MD   Signed by:   Boykin Nearing MD on 06/27/2010   Method used:   Electronically to        [facility] (retail)       [place], [place]  [id number]       Ph: [phone]       Fax: [phone]   RxID:   [id number]   Prevention & Chronic Care Immunizations   Influenza vaccine: Fluvax 3+  (09/14/2009)   Influenza vaccine due: 09/04/2006    Tetanus booster: 07/09/2006: Historical   Tetanus booster due: 07/09/2016    Pneumococcal vaccine: Historical  (07/09/2006)   Pneumococcal vaccine due: None    H. zoster vaccine: Not documented  Colorectal Screening   Hemoccult: normal  (08/25/2008)   Hemoccult action/deferral: Not indicated  (06/27/2010)   Hemoccult due: 08/25/2009    Colonoscopy: hemoccult  (08/25/2008)   Colonoscopy action/deferral: Refused  (09/14/2009)   Colonoscopy due: Not Indicated  Other Screening   PSA: 1.38  (09/14/2009)   Smoking status: quit  (06/27/2010)  Diabetes Mellitus   HgbA1C: 7.7  (06/27/2010)   Hemoglobin A1C due: 11/14/2008    Eye exam: normal  (04/10/2008)   Eye exam due: 04/10/2009    Foot exam: Not documented   High risk foot: Not documented   Foot care education: Not documented    Urine microalbumin/creatinine ratio: Not documented   Urine microalbumin/cr due: 08/14/2009  Lipids   Total Cholesterol: 155  (05/30/2009)   LDL: 91  (05/30/2009)   LDL Direct: Not documented   HDL: 41  (05/30/2009)   Triglycerides: 115  (05/30/2009)    SGOT (AST): 37  (05/30/2009)   SGPT (ALT): 33  (05/30/2009) CMP ordered    Alkaline phosphatase: 58  (05/30/2009)   Total bilirubin: 0.5  (05/30/2009)  Hypertension   Last  Blood Pressure: 149 / 75  (06/27/2010)   Serum creatinine: 0.74   (05/30/2009)   Serum potassium 4.5  (05/30/2009) CMP ordered   Self-Management Support :   Personal Goals (by the next clinic visit) :     Personal A1C goal: 7  (09/14/2009)     Personal blood pressure goal: 140/90  (09/14/2009)     Personal LDL goal: 70  (09/14/2009)    Patient will work on the following items until the next clinic visit to reach self-care goals:     Medications and monitoring: take my medicines every day, check my blood pressure, bring all of my medications to every visit, examine my feet every day  (06/27/2010)     Eating: drink diet soda or water instead of juice or soda, eat fruit for snacks and desserts, limit or avoid alcohol  (06/27/2010)     Activity: take a 30 minute walk every day  (06/27/2010)    Diabetes self-management support: Not documented    Hypertension self-management support: Not documented    Lipid self-management support: Not documented     Lipid self-management support not done because: Good outcomes  (09/14/2009)   Nursing Instructions: Diabetic foot exam today    Laboratory Results   Blood Tests   Date/Time Received: June 27, 2010 1:48 PM  Date/Time Reported: June 27, 2010 2:30 PM   HGBA1C: 7.7%   (Normal Range: Non-Diabetic - 3-6%   Control Diabetic - 6-8%)  Comments: ...........test performed by...........Marland KitchenHedy Camara, CMA

## 2010-11-28 NOTE — Letter (Signed)
Summary: Generic Letter  [facility]  [address]   [place], [place] [address]   Phone: [phone]  Fax: [phone]    11/21/2010  [address] [place], [place]  [address]  Dear Mr. Kluttz,  We are happy to let you know that since you are covered under Medicare you are able to have a FREE Welcome to Medicare visit at the [facility] to discuss your HEALTH. There will be no co-payment.  At this visit you will meet with your doctor and Lamont Dowdy an expert in wellness and the health coach at our clinic.  At this visit we will discuss ways to keep you healthy and feeling well.  This visit will not replace your regular doctor visit and we cannot refill medications.     You will need to plan to be here at least one hour to talk about your medical history, your current status, review all of your medications, and discuss your future plans for your health.  This information will be entered into your record for your doctor to have and review.  If you are interested in staying healthy, this type of visit can help.  Please call the office at: [phone], to schedule a "Medicare Wellness Visit".  The day of the visit you should bring in all of your medications, including any vitamins, herbs, over the counter products you take.  Make a list of all the other doctors that you see, so we know who they are. If you have any other health documents please bring them.  We look forward to helping you stay healthy.  Sincerely,   Suzanne Lineberry Tina

## 2010-12-13 ENCOUNTER — Telehealth: Payer: Self-pay | Admitting: Family Medicine

## 2010-12-13 MED ORDER — HYDROCODONE-ACETAMINOPHEN 10-500 MG PO TABS: 1.0000 | ORAL_TABLET | Freq: Three times a day (TID) | ORAL | Status: DC | PRN

## 2010-12-13 NOTE — Telephone Encounter (Signed)
Spoke with pt. States meds are now being mailed to him every 3 months. He got 7 rx. Metoprolol is for 90 days. All the rest are for 60 days. Did not get the

## 2010-12-13 NOTE — Telephone Encounter (Signed)
Simvastatin, losartan  & needs refill on hydrocodone. Wants sent locally to [facility] on [address]. He will call the pharmacy & tell them what he needs. Will ask preceptor about refill on the pain med.

## 2010-12-13 NOTE — Telephone Encounter (Signed)
Per old EMR received 90 tabs with 3 refills 12/2009.  OK to refill.

## 2010-12-17 ENCOUNTER — Encounter: Payer: Self-pay | Admitting: Family Medicine

## 2010-12-17 ENCOUNTER — Ambulatory Visit (INDEPENDENT_AMBULATORY_CARE_PROVIDER_SITE_OTHER): Payer: Medicare HMO | Admitting: Family Medicine

## 2010-12-17 VITALS — BP 128/75 | HR 74 | Temp 98.3°F | Ht 64.0 in | Wt 223.9 lb

## 2010-12-17 DIAGNOSIS — M545 Low back pain: Secondary | ICD-10-CM

## 2010-12-17 DIAGNOSIS — E118 Type 2 diabetes mellitus with unspecified complications: Secondary | ICD-10-CM

## 2010-12-17 DIAGNOSIS — E785 Hyperlipidemia, unspecified: Secondary | ICD-10-CM

## 2010-12-17 LAB — POCT GLYCOSYLATED HEMOGLOBIN (HGB A1C): Hemoglobin A1C: 6.9

## 2010-12-17 MED ORDER — SIMVASTATIN 80 MG PO TABS: 80.0000 mg | ORAL_TABLET | Freq: Every day | ORAL | Status: DC

## 2010-12-17 MED ORDER — HYDROCODONE-ACETAMINOPHEN 10-500 MG PO TABS: 1.0000 | ORAL_TABLET | Freq: Three times a day (TID) | ORAL | Status: DC | PRN

## 2010-12-17 NOTE — Progress Notes (Signed)
  Subjective:    Patient ID: Leslie [name], male    DOB: Mar 13, 1946, 65 y.o.   MRN: [id number]  Hyperlipidemia This is a chronic problem. The current episode started more than 1 year ago. The problem is controlled. Recent lipid tests were reviewed and are normal. Pertinent negatives include no chest pain, focal weakness, leg pain, myalgias or shortness of breath. Current antihyperlipidemic treatment includes statins. There are no compliance problems.   Back Pain This is a chronic problem. The current episode started more than 1 year ago. The problem is unchanged. The pain is moderate. Pertinent negatives include no chest pain or leg pain. He has tried analgesics for the symptoms. The treatment provided moderate relief.      Review of Systems  Respiratory: Negative for shortness of breath.   Cardiovascular: Negative for chest pain.  Musculoskeletal: Positive for back pain. Negative for myalgias.  Neurological: Negative for focal weakness.       Objective:   Physical Exam  Constitutional:       Obese   Cardiovascular: Normal rate and regular rhythm.   Pulmonary/Chest: Effort normal and breath sounds normal.  Musculoskeletal:       Low back: limited ROM 2/2 pain          Assessment & Plan:

## 2010-12-17 NOTE — Assessment & Plan Note (Signed)
Unchanged.  Controlled with current medications.  Continue Lortab.

## 2010-12-17 NOTE — Assessment & Plan Note (Signed)
LDL at goal.  Pt may be able to decrease Simvastatin dose or switch to Lipitor.  Will defer to PCP.

## 2011-01-23 ENCOUNTER — Other Ambulatory Visit: Payer: Self-pay | Admitting: Family Medicine

## 2011-01-23 ENCOUNTER — Other Ambulatory Visit: Payer: Self-pay | Admitting: *Deleted

## 2011-01-23 DIAGNOSIS — E785 Hyperlipidemia, unspecified: Secondary | ICD-10-CM

## 2011-01-23 MED ORDER — ESOMEPRAZOLE MAGNESIUM 40 MG PO CPDR: 40.0000 mg | DELAYED_RELEASE_CAPSULE | Freq: Every day | ORAL | Status: DC

## 2011-01-23 MED ORDER — LOSARTAN POTASSIUM 50 MG PO TABS: 50.0000 mg | ORAL_TABLET | Freq: Every day | ORAL | Status: DC

## 2011-01-23 MED ORDER — SIMVASTATIN 80 MG PO TABS: 80.0000 mg | ORAL_TABLET | Freq: Every day | ORAL | Status: DC

## 2011-01-23 MED ORDER — FUROSEMIDE 20 MG PO TABS: 20.0000 mg | ORAL_TABLET | Freq: Every day | ORAL | Status: DC

## 2011-01-23 MED ORDER — GLIPIZIDE 10 MG PO TABS: 10.0000 mg | ORAL_TABLET | ORAL | Status: DC

## 2011-01-23 MED ORDER — AMLODIPINE BESYLATE 10 MG PO TABS: 10.0000 mg | ORAL_TABLET | Freq: Every day | ORAL | Status: DC

## 2011-01-23 MED ORDER — ALBUTEROL SULFATE HFA 108 (90 BASE) MCG/ACT IN AERS: 2.0000 | INHALATION_SPRAY | RESPIRATORY_TRACT | Status: DC | PRN

## 2011-01-23 MED ORDER — METFORMIN HCL 1000 MG PO TABS: 1000.0000 mg | ORAL_TABLET | Freq: Two times a day (BID) | ORAL | Status: DC

## 2011-01-23 MED ORDER — METOPROLOL TARTRATE 100 MG PO TABS: 200.0000 mg | ORAL_TABLET | Freq: Two times a day (BID) | ORAL | Status: DC

## 2011-01-23 MED ORDER — ALLOPURINOL 300 MG PO TABS: 300.0000 mg | ORAL_TABLET | Freq: Every day | ORAL | Status: DC

## 2011-01-23 NOTE — Telephone Encounter (Signed)
Patient calling because he has not received his mail order rx yet.  Paged Dr. [name] Blackwater and she has refill request in her bag and will send in today. Patient needs meds for a least a week to get him thru until mail order received. rx called to  [facility] for amlodipine, simvastatin, glipizide, furosemide and nexium

## 2011-01-23 NOTE — Telephone Encounter (Signed)
Addended by: Marcell Barlow on: 01/23/2011 12:24 PM   Modules accepted: Orders

## 2011-02-03 ENCOUNTER — Ambulatory Visit (INDEPENDENT_AMBULATORY_CARE_PROVIDER_SITE_OTHER): Payer: Medicare HMO | Admitting: Family Medicine

## 2011-02-03 ENCOUNTER — Encounter: Payer: Self-pay | Admitting: Family Medicine

## 2011-02-03 VITALS — BP 140/80 | HR 68 | Wt 225.3 lb

## 2011-02-03 DIAGNOSIS — M545 Low back pain, unspecified: Secondary | ICD-10-CM

## 2011-02-03 DIAGNOSIS — E118 Type 2 diabetes mellitus with unspecified complications: Secondary | ICD-10-CM

## 2011-02-03 DIAGNOSIS — E785 Hyperlipidemia, unspecified: Secondary | ICD-10-CM

## 2011-02-03 DIAGNOSIS — I10 Essential (primary) hypertension: Secondary | ICD-10-CM

## 2011-02-03 MED ORDER — HYDROCODONE-ACETAMINOPHEN 10-500 MG PO TABS: 1.0000 | ORAL_TABLET | Freq: Three times a day (TID) | ORAL | Status: DC | PRN

## 2011-02-03 NOTE — Assessment & Plan Note (Addendum)
Pt compliant with medications. No myositis, Will check FLP next visit. If pt doing well will decrease simvastatin to 40.

## 2011-02-03 NOTE — Assessment & Plan Note (Signed)
Pt compliant with medications. A A1c trending down at last assessment. Will check in Early may.  Continue current regimen

## 2011-02-03 NOTE — Assessment & Plan Note (Signed)
Well controlled.  Continue to encourage weight loss.

## 2011-02-03 NOTE — Progress Notes (Signed)
  Subjective:    Patient ID: Leslie [name], male    DOB: 1945/10/29, 65 y.o.   MRN: [id number]  HPI 65 yo M here to discuss medications. Needs meds refilled. Ran out of simvistatin last week. All meds except lortab [phone] with a 3 mos supply. Meds have been called in. Pt aware. He will receive his meds this Wednesday.   Regarding obesity- pt walking. Down 3 lbs.  Regarding DM. Pt taking meds. Denies N/V/D, plydipsia and polyuria. He does not check his blood sugars.  Regarding- HTN- pt denies CP, SOB, HA, dizziness. Take his medications.  Regarding chronic back pain-pt has daily low back pain 2/2 to . Does limit mobility at times. Controlled with lortab.    Review of Systems  Constitutional: Negative.   HENT: Negative.   Eyes: Positive for itching.  Respiratory: Negative.   Cardiovascular: Negative.   Gastrointestinal: Negative.   Musculoskeletal: Positive for back pain.       Objective:   Physical Exam  Constitutional: He is oriented to person, place, and time. He appears well-developed and well-nourished.  HENT:  Head: Normocephalic.  Eyes: EOM are normal.  Pulmonary/Chest: Effort normal.  Musculoskeletal: Normal range of motion.  Neurological: He is alert and oriented to person, place, and time.          Assessment & Plan:

## 2011-02-03 NOTE — Assessment & Plan Note (Signed)
Chronic, > 5 yrs. 2/2 to hernaited disc. Controlled. Pt still able to perform ADLs. Plan: analgesia, weight loss and core strengthening.

## 2011-05-01 ENCOUNTER — Inpatient Hospital Stay (HOSPITAL_COMMUNITY)
Admission: AD | Admit: 2011-05-01 | Discharge: 2011-05-06 | DRG: 603 | Disposition: A | Discharge status: Home / Self Care | Payer: Medicare HMO | Source: Ambulatory Visit | Attending: Family Medicine | Admitting: Family Medicine

## 2011-05-01 ENCOUNTER — Ambulatory Visit (INDEPENDENT_AMBULATORY_CARE_PROVIDER_SITE_OTHER): Payer: Medicare HMO | Admitting: Family Medicine

## 2011-05-01 ENCOUNTER — Inpatient Hospital Stay (HOSPITAL_COMMUNITY): Payer: Medicare HMO

## 2011-05-01 ENCOUNTER — Encounter: Payer: Self-pay | Admitting: Family Medicine

## 2011-05-01 VITALS — BP 129/65 | HR 75 | Temp 99.4°F | Wt 225.0 lb

## 2011-05-01 DIAGNOSIS — E1169 Type 2 diabetes mellitus with other specified complication: Secondary | ICD-10-CM

## 2011-05-01 DIAGNOSIS — Z87891 Personal history of nicotine dependence: Secondary | ICD-10-CM

## 2011-05-01 DIAGNOSIS — L02619 Cutaneous abscess of unspecified foot: Secondary | ICD-10-CM

## 2011-05-01 DIAGNOSIS — Z7982 Long term (current) use of aspirin: Secondary | ICD-10-CM

## 2011-05-01 DIAGNOSIS — L03119 Cellulitis of unspecified part of limb: Secondary | ICD-10-CM

## 2011-05-01 DIAGNOSIS — E1149 Type 2 diabetes mellitus with other diabetic neurological complication: Secondary | ICD-10-CM | POA: Diagnosis present

## 2011-05-01 DIAGNOSIS — E118 Type 2 diabetes mellitus with unspecified complications: Secondary | ICD-10-CM

## 2011-05-01 DIAGNOSIS — E1142 Type 2 diabetes mellitus with diabetic polyneuropathy: Secondary | ICD-10-CM | POA: Diagnosis present

## 2011-05-01 DIAGNOSIS — F1020 Alcohol dependence, uncomplicated: Secondary | ICD-10-CM

## 2011-05-01 DIAGNOSIS — I70209 Unspecified atherosclerosis of native arteries of extremities, unspecified extremity: Secondary | ICD-10-CM | POA: Diagnosis present

## 2011-05-01 DIAGNOSIS — E11628 Type 2 diabetes mellitus with other skin complications: Secondary | ICD-10-CM | POA: Insufficient documentation

## 2011-05-01 DIAGNOSIS — E7800 Pure hypercholesterolemia, unspecified: Secondary | ICD-10-CM | POA: Diagnosis present

## 2011-05-01 DIAGNOSIS — M109 Gout, unspecified: Secondary | ICD-10-CM | POA: Insufficient documentation

## 2011-05-01 DIAGNOSIS — I10 Essential (primary) hypertension: Secondary | ICD-10-CM | POA: Diagnosis present

## 2011-05-01 DIAGNOSIS — L02419 Cutaneous abscess of limb, unspecified: Secondary | ICD-10-CM | POA: Diagnosis present

## 2011-05-01 DIAGNOSIS — Z79899 Other long term (current) drug therapy: Secondary | ICD-10-CM

## 2011-05-01 LAB — GLUCOSE, CAPILLARY

## 2011-05-01 LAB — BASIC METABOLIC PANEL
CO2: 26 mEq/L (ref 19–32)
Chloride: 90 mEq/L — ABNORMAL LOW (ref 96–112)
GFR calc non Af Amer: >60 mL/min (ref >60)
Glucose, Bld: 89 mg/dL (ref 70–99)
Potassium: 3.8 mEq/L (ref 3.5–5.1)
Sodium: 127 mEq/L — ABNORMAL LOW (ref 135–145)

## 2011-05-01 LAB — DIFFERENTIAL
Basophils Relative: 0 % (ref 0–1)
Eosinophils Absolute: 0 10*3/uL (ref 0.0–0.7)
Eosinophils Relative: 0 % (ref 0–5)
Lymphs Abs: 1.6 10*3/uL (ref 0.7–4.0)
Monocytes Absolute: 1.8 10*3/uL — ABNORMAL HIGH (ref 0.1–1.0)
Monocytes Relative: 13 % — ABNORMAL HIGH (ref 3–12)
Neutrophils Relative %: 75 % (ref 43–77)

## 2011-05-01 LAB — CBC
HCT: 33 % — ABNORMAL LOW (ref 39.0–52.0)
Hemoglobin: 12 g/dL — ABNORMAL LOW (ref 13.0–17.0)
MCH: 31.7 pg (ref 26.0–34.0)
MCHC: 36.4 g/dL — ABNORMAL HIGH (ref 30.0–36.0)
MCV: 87.1 fL (ref 78.0–100.0)

## 2011-05-01 MED ORDER — CEFTRIAXONE SODIUM 1 G IJ SOLR
1.0000 g | Freq: Once | INTRAMUSCULAR | Status: AC
  Administered 2011-05-01: 1 g via INTRAMUSCULAR

## 2011-05-01 NOTE — Assessment & Plan Note (Addendum)
A: R foot cellulitis in a diabetic. Given pt's long history of diabetes and peripheral neuropathy as well as likely peripheral vascular disease, aggressive treatment with IV antibiotics will be the best course of action to prevent loss of limb. P: Admit to [facility].  Studies: R foot  x-ray x 3 views, LE dopplers, ABI (needs to be added to admit orders), Blood Cx x 2, CBC with Diff. Add on ESR to admit orders.  Meds: IV Vanc and Zosyn. Pt give 1 gm of Rocephin in office.

## 2011-05-01 NOTE — Progress Notes (Signed)
Subjective:    Patient ID: Leslie [name], male    DOB: 17-Aug-1946, 65 y.o.   MRN: [id number]  HPI Pt reports for evaluation of R foot pain, redness and swelling.  R foot redness and pain starting last night at 7:30 PM . The majority of the pain is in the medial plantar surface of the foot.Pt denies trauma to the area.  He is barefoot at night when he goes to bed but denies stepping on a foreign body. There is a possibility that he as bitten on his foot. He has not noticed spiders in his apartment. He reports checking his feet nightly. At baseline he had diabetic neuropathy characterized by poor sensation in both feet. He does report increased activity since the beginning of June. He either swims or walks at the [facility] daily. He denies similar symptoms before. He denies fever. He denies calf pain, recent long car travel or plane travel.    Past Medical History  Diagnosis Date  . Gout   . Asthma   . COPD (chronic obstructive pulmonary disease)   . Diabetes mellitus     Diagnosed in 2002  . GERD (gastroesophageal reflux disease)   . Hyperlipidemia   . Hypertension   . Substance abuse     History reviewed. No pertinent past surgical history. History reviewed. No pertinent family history. History   Social History  . Marital Status: Widowed    Spouse Name: N/A    Number of Children: N/A  . Years of Education: N/A   Occupational History  . disabled    Social History Main Topics  . Smoking status: Former Smoker    Types: Cigarettes    Quit date: 01/13/2002  . Smokeless tobacco: Never Used  . Alcohol Use: 14.0 oz/week    28 drink(s) per week     Drinks 4+ beers per night.   . Drug Use: Not on file  . Sexually Active: Not on file   Other Topics Concern  . Not on file   Social History Narrative   Disabled--former Architectural technologist.Widowed.5-8 years educationRents home, owns a car >4 beers/dayLikes to fishPets:  4 cockatiels and 2 lovebirds     Review of Systems    Constitutional: Negative.   Musculoskeletal: Positive for back pain, arthralgias and gait problem. Negative for myalgias and joint swelling.  Skin: Positive for color change. Negative for pallor, rash and wound.       Objective:   Physical Exam  Constitutional: He appears well-developed and well-nourished. No distress.  Skin: Skin is warm and dry. No erythema.       R lower extremity: Purpura R toe (medial) and overlying medial aspect of proximal metatarsal. No ulceration or puncture wounds. Swelling and surrounding erythema. Erythema up to R knee. Diminished DP pulse (1+). No streaking. Middle on medial dorsal surface. No joint tenderness.           Assessment & Plan:  65 yo M with R foot pain, redness and swelling.  1. Cellulitis in a diabetic (10 + years): A: R foot cellulitis in a diabetic. Given pt's long history of diabetes and peripheral neuropathy as well as likely peripheral vascular disease, aggressive treatment with IV antibiotics will be the best course of action to prevent loss of limb. P: Admit to [facility]. Pt has to go home first to pay rent and arrange care for his birds. Agrees to arrive between 4-5 PM.  Studies: R foot  x-ray x 3 views, LE  dopplers, ABI (needs to be added to admit orders). Labs: Blood Cx x 2, CBC with Diff. ESR (please add to admit orders).  Meds: IV Vanc and Zosyn. Pt give 1 gm of Rocephin in office.   2. Alcohol abuse: Significant alcohol history (4-6 beers per night). A: Stable. Pt at risk for withdrawals. P: CMET (assess LFTs), place on CIWA protocol.  (please add to admit orders)   3. Diabetes: Check A1c (last 6.9 on 12/08/10). Continue oral meds per med rec. May start low dose SSI if CBG elevated.   4. HTN: Stable. Continue meds per med rec.  5. FEN/GI: Diabetic diet.   6. DVT prophylaxis: Lovenox 40 mg [place]  q day.   7. Dispo: pending completion of work-up and response to IV antibiotic therapy.

## 2011-05-01 NOTE — Assessment & Plan Note (Signed)
2. Alcohol abuse: Significant alcohol history (4-6 beers per night). A: Stable. Pt at risk for withdrawals. P: CMET (assess LFTs), place on CIWA protocol.

## 2011-05-01 NOTE — Patient Instructions (Signed)
Leslie [name],  We will admit you to the hospital to treat your R foot cellulitis with IV antibiotics and to get studies. Please return to the hospital after you make arrangements, return to admitting. In the meantime stay off of your foot as much as possible and keep your foot elevated.   -Dr. [name] Blackwater

## 2011-05-02 DIAGNOSIS — I739 Peripheral vascular disease, unspecified: Secondary | ICD-10-CM

## 2011-05-02 DIAGNOSIS — L03119 Cellulitis of unspecified part of limb: Secondary | ICD-10-CM

## 2011-05-02 DIAGNOSIS — L02419 Cutaneous abscess of limb, unspecified: Secondary | ICD-10-CM

## 2011-05-02 DIAGNOSIS — M7989 Other specified soft tissue disorders: Secondary | ICD-10-CM

## 2011-05-02 LAB — HEMOGLOBIN A1C: Hgb A1c MFr Bld: 7.1 % — ABNORMAL HIGH (ref <5.7)

## 2011-05-02 LAB — GLUCOSE, CAPILLARY

## 2011-05-03 DIAGNOSIS — M7989 Other specified soft tissue disorders: Secondary | ICD-10-CM

## 2011-05-03 DIAGNOSIS — L03119 Cellulitis of unspecified part of limb: Secondary | ICD-10-CM

## 2011-05-03 DIAGNOSIS — R609 Edema, unspecified: Secondary | ICD-10-CM

## 2011-05-03 DIAGNOSIS — L02419 Cutaneous abscess of limb, unspecified: Secondary | ICD-10-CM

## 2011-05-03 LAB — CBC
Hemoglobin: 11.3 g/dL — ABNORMAL LOW (ref 13.0–17.0)
MCH: 31.2 pg (ref 26.0–34.0)
RBC: 3.62 MIL/uL — ABNORMAL LOW (ref 4.22–5.81)

## 2011-05-03 LAB — GLUCOSE, CAPILLARY
Glucose-Capillary: 152 mg/dL — ABNORMAL HIGH (ref 70–99)
Glucose-Capillary: 138 mg/dL — ABNORMAL HIGH (ref 70–99)
Glucose-Capillary: 135 mg/dL — ABNORMAL HIGH (ref 70–99)

## 2011-05-04 LAB — GLUCOSE, CAPILLARY
Glucose-Capillary: 137 mg/dL — ABNORMAL HIGH (ref 70–99)
Glucose-Capillary: 232 mg/dL — ABNORMAL HIGH (ref 70–99)

## 2011-05-05 DIAGNOSIS — L02419 Cutaneous abscess of limb, unspecified: Secondary | ICD-10-CM

## 2011-05-05 DIAGNOSIS — L03119 Cellulitis of unspecified part of limb: Secondary | ICD-10-CM

## 2011-05-05 DIAGNOSIS — M7989 Other specified soft tissue disorders: Secondary | ICD-10-CM

## 2011-05-05 LAB — CBC
HCT: 35 % — ABNORMAL LOW (ref 39.0–52.0)
Hemoglobin: 12.9 g/dL — ABNORMAL LOW (ref 13.0–17.0)
MCH: 31.9 pg (ref 26.0–34.0)
MCV: 86.6 fL (ref 78.0–100.0)
RBC: 4.04 MIL/uL — ABNORMAL LOW (ref 4.22–5.81)

## 2011-05-05 LAB — GLUCOSE, CAPILLARY: Glucose-Capillary: 267 mg/dL — ABNORMAL HIGH (ref 70–99)

## 2011-05-06 ENCOUNTER — Inpatient Hospital Stay: Payer: Medicare HMO | Admitting: Family Medicine

## 2011-05-06 LAB — BASIC METABOLIC PANEL
BUN: 11 mg/dL (ref 6–23)
GFR calc Af Amer: >60 mL/min (ref >60)
GFR calc non Af Amer: >60 mL/min (ref >60)
Potassium: 4.4 mEq/L (ref 3.5–5.1)
Sodium: 135 mEq/L (ref 135–145)

## 2011-05-06 LAB — GLUCOSE, CAPILLARY
Glucose-Capillary: 142 mg/dL — ABNORMAL HIGH (ref 70–99)
Glucose-Capillary: 145 mg/dL — ABNORMAL HIGH (ref 70–99)

## 2011-05-06 LAB — CBC
Hemoglobin: 12.2 g/dL — ABNORMAL LOW (ref 13.0–17.0)
MCHC: 36.1 g/dL — ABNORMAL HIGH (ref 30.0–36.0)
RBC: 3.86 MIL/uL — ABNORMAL LOW (ref 4.22–5.81)

## 2011-05-06 NOTE — Consult Note (Signed)
NAMEJEVAN, BERTELSEN NO.:  [id number]  MEDICAL RECORD NO.:  [id number]  LOCATION:  3031                         FACILITY:  [facility]  PHYSICIAN:  Judeth Cornfield. Scot Dock, M.D.DATE OF BIRTH:  December 12, 1945  DATE OF CONSULTATION:  05/02/2011 DATE OF DISCHARGE:                                CONSULTATION   REASON FOR CONSULTATION:  Cellulitis of the right leg with peripheral vascular disease.  HISTORY:  This is a pleasant 65 year old gentleman, who states that he developed the sudden onset of swelling and redness in his right leg less than a week ago.  He was admitted this morning by the [facility] Medicine Teaching Service with cellulitis of the right leg.  He states over the last few days, the swelling has improved significantly and also the redness in the right leg.  Of note, he states that he is ambulatory and does not describe any specific claudication.  In addition, he denies rest pain or previous history of nonhealing ulcers.  He has been trying to lose weight and has been going to the [facility] and walking a tenth of a mile.  He states that he has back pain which limits his ambulation, but no leg pain.  He is unaware of any previous history of DVT or phlebitis.  PAST MEDICAL HISTORY:  Significant for non-insulin-dependent diabetes, hypertension, and hypercholesterolemia.  He denies any history of previous myocardial infarction, history of congestive heart failure, or history of COPD.  FAMILY HISTORY:  There is no history of premature cardiovascular disease.  SOCIAL HISTORY:  He is single.  He does not have any children.  He quit tobacco 10 years ago.  He had smoked two packs per day for 45 years.  REVIEW OF SYSTEMS:  GENERAL:  He has had no weight loss, weight gain, or problems with his appetite.  CARDIOVASCULAR:  He has had no chest pain, chest pressure, palpitations, or arrhythmias.  He has had no history of stroke or TIA.  He has had no history of DVT or  phlebitis.  PULMONARY: He does admit to occasional episodes of bronchitis.  GI, GU, neurologic, psychiatric, ENT, hematologic, and integumentary review of systems are unremarkable.  Musculoskeletal review of systems is significant for gout.  PHYSICAL EXAMINATION:  GENERAL:  This is a pleasant 65 year old gentleman, who appears his stated age. VITAL SIGNS:  His temperature is 98.6, heart rate 74, respiratory rate 18, blood pressure 153/73, saturation 94%. HEENT:  He has a large mass adjacent to his right nares.  Exam is otherwise unremarkable. LUNGS:  Clear bilaterally to auscultation without rales, rhonchi, or wheezing. CARDIOVASCULAR:  I do not detect any carotid bruits.  He has a regular rate and rhythm. EXTREMITIES:  He has a palpable right femoral pulse, somewhat difficult to palpate the left femoral pulse, although, because of his size, it is difficult to examine his femoral pulses.  I cannot palpate popliteal or pedal pulses on either side.  He has patchy areas of cellulitis in the right leg, but no open ulcers that I can detect with moderate right lower extremity swelling. ABDOMEN:  Soft and nontender with normal pitched bowel sounds.  He is obese and it is difficult  to assess. MUSCULOSKELETAL:  There is no major deformities or cyanosis. NEUROLOGIC:  He has no focal weakness or paresthesias. SKIN:  The cellulitis involves the right leg from the knee down and patchy areas. LYMPHATIC:  There is no significant cervical, axillary, or inguinal lymphadenopathy.  I have reviewed his lab studies which show a hemoglobin A1c of 7.1.  His creatinine is 0.81.  His white count is 14 with an H and H of 12 and 33, platelets 125,000.  I have also reviewed his arterial Doppler study, which I have independently interpreted and shows an ABI 57% on the right and 88% on the left.  I have also reviewed his x-rays which x-rays of the right leg show an old healed fifth metatarsal fracture, but  no evidence of acute bony abnormalities and no evidence of osteomyelitis.  IMPRESSION:  This patient presents with cellulitis of the right leg, which could potentially be related to lymphedema and lymphangitis.  He is currently being treated with IV vancomycin and Zosyn, which I think is appropriate and he seems to be responding to this.  Given that he had significant swelling in the right leg, I have ordered a venous duplex scan to rule out a DVT, although, I think this is unlikely.  He does have evidence of infrainguinal arterial occlusive disease; however, this appears to be chronic and I do not think currently has a acute arterial problem and I think he should have adequate circulation to heal his infection with antibiotics.  We will follow closely however.     Judeth Cornfield. Scot Dock, M.D.     CSD/MEDQ  D:  05/02/2011  T:  05/03/2011  Job:  [id number]  cc:   Ashby Dawes. Polite, M.D.  Electronically Signed by Deitra Mayo M.D. on 05/06/2011 02:14:43 PM

## 2011-05-08 LAB — CULTURE, BLOOD (ROUTINE X 2): Culture: NO GROWTH

## 2011-05-13 ENCOUNTER — Ambulatory Visit (INDEPENDENT_AMBULATORY_CARE_PROVIDER_SITE_OTHER): Payer: Medicare HMO | Admitting: Family Medicine

## 2011-05-13 ENCOUNTER — Encounter: Payer: Self-pay | Admitting: Family Medicine

## 2011-05-13 DIAGNOSIS — L02619 Cutaneous abscess of unspecified foot: Secondary | ICD-10-CM

## 2011-05-13 DIAGNOSIS — M545 Low back pain: Secondary | ICD-10-CM

## 2011-05-13 DIAGNOSIS — E118 Type 2 diabetes mellitus with unspecified complications: Secondary | ICD-10-CM

## 2011-05-13 DIAGNOSIS — I10 Essential (primary) hypertension: Secondary | ICD-10-CM

## 2011-05-13 DIAGNOSIS — E11628 Type 2 diabetes mellitus with other skin complications: Secondary | ICD-10-CM

## 2011-05-13 DIAGNOSIS — E785 Hyperlipidemia, unspecified: Secondary | ICD-10-CM

## 2011-05-13 DIAGNOSIS — E1169 Type 2 diabetes mellitus with other specified complication: Secondary | ICD-10-CM

## 2011-05-13 MED ORDER — FUROSEMIDE 20 MG PO TABS: 20.0000 mg | ORAL_TABLET | Freq: Every day | ORAL | Status: DC

## 2011-05-13 MED ORDER — ALLOPURINOL 300 MG PO TABS: 300.0000 mg | ORAL_TABLET | Freq: Every day | ORAL | Status: DC

## 2011-05-13 MED ORDER — HYDROCODONE-ACETAMINOPHEN 10-500 MG PO TABS: 1.0000 | ORAL_TABLET | Freq: Every day | ORAL | Status: DC | PRN

## 2011-05-13 MED ORDER — AMLODIPINE BESYLATE 10 MG PO TABS: 10.0000 mg | ORAL_TABLET | Freq: Every day | ORAL | Status: DC

## 2011-05-13 MED ORDER — GLIPIZIDE 10 MG PO TABS: 10.0000 mg | ORAL_TABLET | ORAL | Status: DC

## 2011-05-13 MED ORDER — METFORMIN HCL 1000 MG PO TABS: 1000.0000 mg | ORAL_TABLET | Freq: Two times a day (BID) | ORAL | Status: DC

## 2011-05-13 MED ORDER — ESOMEPRAZOLE MAGNESIUM 40 MG PO CPDR: 40.0000 mg | DELAYED_RELEASE_CAPSULE | Freq: Every day | ORAL | Status: DC

## 2011-05-13 MED ORDER — METOPROLOL TARTRATE 100 MG PO TABS: 200.0000 mg | ORAL_TABLET | Freq: Two times a day (BID) | ORAL | Status: DC

## 2011-05-13 MED ORDER — LOSARTAN POTASSIUM 50 MG PO TABS: 50.0000 mg | ORAL_TABLET | Freq: Every day | ORAL | Status: DC

## 2011-05-13 MED ORDER — PRAVASTATIN SODIUM 80 MG PO TABS: 80.0000 mg | ORAL_TABLET | Freq: Every evening | ORAL | Status: DC

## 2011-05-13 NOTE — Assessment & Plan Note (Signed)
Assessment: resolving. Plan: Complete course of Doxycycline. Instructions to keep foot elevated. Referral to Podiatry. Instructions to wear shoes at all times.

## 2011-05-13 NOTE — Assessment & Plan Note (Signed)
Changed Simvastatin to Pravastatin to avoid interaction with amlodipine.

## 2011-05-13 NOTE — Progress Notes (Signed)
  Subjective:    Patient ID: Leslie [name], male    DOB: [birth year], 65 y.o.   MRN: [id number]  HPI 1. Here for hosp f/u for R foot cellulitis. Pt taking course of Doxycycline. Still has some soreness that is slowly improving. R toes ulcer is healing well. Has some scaling since he went swimming. Afebrile. Redness slowly improving. Reviewed discharge summary.  2.  Diabetes: some elevated CBGs in hospital. Taking medication. Denies side effects. Exercising and losing weight. Wears diabetic shoes, paid $50-60 for pair, due for another pair. Has not been to the podiatrist.  3. HTN: Well controlled. Denies CP, SOB. Taking medication.   Review of Systems    As per HPI Objective:   Physical Exam  Nursing note and vitals reviewed. Constitutional: He appears well-developed and well-nourished.  Pulmonary/Chest: Effort normal.  Skin: Skin is warm and dry.       R foot with improving redness up to mid calf. Diminished DP pulse, barely palpable. Non tender.           Assessment & Plan:

## 2011-05-13 NOTE — Patient Instructions (Addendum)
Mr. Hierholzer,  Thank yo for coming in today. F/u in 2.5 months. I have changes your simvistatin to pravastatin to avoid interaction with norvasc. Please come fasting to your f/u visit so that I can check your cholesterol.  Regarding diabetes: continue meds, referral to podiatry, diabetic shoes. Great, great job, with the exercise! Keep up the excellent work.   -Dr. [name] Blackwater

## 2011-05-13 NOTE — Assessment & Plan Note (Addendum)
Assessment: Stable. HgA1c 7.1. Plan: Continue current medication regimen. F/u with repeat A1c in 3 mos.  Referral to podiatry. Order diabetic shoes size 9.5 regular without laces.

## 2011-05-13 NOTE — Assessment & Plan Note (Signed)
A: Well controlled. At goal for diabetic. Plan: Continue current anti-HTN regimen. Encourage continued exercise for weight loss.

## 2011-05-16 MED ORDER — METFORMIN HCL 1000 MG PO TABS: 1000.0000 mg | ORAL_TABLET | Freq: Two times a day (BID) | ORAL | Status: DC

## 2011-05-16 NOTE — Discharge Summary (Signed)
NAMETARVIS, MANGELS NO.:  [id number]  MEDICAL RECORD NO.:  [id number]  LOCATION:  3031                         FACILITY:  [facility]  PHYSICIAN:  Blane Ohara Rolland Steinert, M.D.DATE OF BIRTH:  Nov 20, 1945  DATE OF ADMISSION:  05/01/2011 DATE OF DISCHARGE:  05/05/2011                              DISCHARGE SUMMARY   DISCHARGE DIAGNOSES: 1. Cellulitis of right lower extremity. 2. Diabetes mellitus. 3. Hypertension. 4. Hyperlipoproteinemia. 5. Chronic obstructive pulmonary disease. 6. Obesity. 7. Gout.  DISCHARGE MEDICATIONS: 1. Ciprofloxacin 500 mg p.o. b.i.d. 2. Doxycycline 100 mg p.o. b.i.d.; these two medications for 10 days. 3. Acetaminophen 2 tablets p.o. daily p.r.n. 4. Metformin 1000 mg 1 tablet p.o. b.i.d. with meals. 5. Glipizide 10 mg 1 tablet p.o. daily in the morning. 6. Losartan 50 mg 1 tablet p.o. daily. 7. Furosemide 20 mg 1 tablet p.o. daily. 8. Amlodipine 10 mg 1 tablet p.o. daily. 9. Metoprolol 100 mg 2 tablets p.o. b.i.d. 10.Advair 500/5 two puffs daily p.r.n. 11.Albuterol inhaler 90 mcg 2 puffs nightly. 12.Allopurinol 300 mg 1 tablet p.o. daily. 13.Simvastatin 80 mg p.o. nightly.  CONSULTS:  With Vascular Surgery.  LABORATORY DATA:  White blood count 14 on admission, on discharge 7.8. Blood cultures negative.  A1c is 7.1.  PERTINENT STUDIES: 1. ABI on the right 0.57, on the left 0.88. 2. Doppler ultrasound, no DVT.  BRIEF HOSPITAL COURSE: 1. This is a 65 year old male with history of diabetes mellitus,     hypertension, and hyperlipoproteinemia that is admitted for right     lower extremity cellulitis.  The patient with poor vascular lower     extremity condition that had 3 days of IV antibiotic treatment with     vancomycin and Zosyn.  White count was trending down to normal at     discharge.  Low ankle brachial index on the right evaluated by     Vascular Team that wanted to rule out DVT.  No vascular thrombosis     present at this  time and they will like to follow up as outpatient.     Cellulitis improved and antibiotics were changed to oral with     similar spectrum for 10 days' course. 2. Diabetes mellitus.  Initially, blood glucose was high, suspected to     be caused by the infectious process that went to his baseline on     the second hospital day. 3. Hypertension, controlled during this time. 4. COPD.  No shortness of breath.  No O2 requirements needed at this     time. 5. Rest of his medical conditions were stable during this hospitalization.  DISCHARGE INSTRUCTIONS: 1. Return to his daily activities slowly. 2. Diet:  Diabetic, heart-healthy diet. 3. Wound:  Keep area of cellulitis clean. 4. Appointment with his PCP, Dr. [name] Blackwater on Tuesday, 17th at 9 a.m. 5. The patient is discharged home with home health and rolling walker     after being evaluated by PT/OT.    ______________________________ Westley Hummer, MD   ______________________________ Blane Ohara Zuhair Lariccia, M.D.    DP/MEDQ  D:  05/10/2011  T:  05/10/2011  Job:  [id number]  Electronically Signed by Gwendolyn Fill  DE LA PAZ  on 05/13/2011 11:22:06 PM Electronically Signed by Lissa Morales M.D. on 05/16/2011 04:43:38 PM

## 2011-05-16 NOTE — Progress Notes (Signed)
Addended by: Boykin Nearing on: 05/16/2011 01:47 PM   Modules accepted: Orders

## 2011-06-02 ENCOUNTER — Other Ambulatory Visit: Payer: Self-pay | Admitting: Family Medicine

## 2011-06-02 DIAGNOSIS — E118 Type 2 diabetes mellitus with unspecified complications: Secondary | ICD-10-CM

## 2011-06-02 MED ORDER — METFORMIN HCL 1000 MG PO TABS: 1000.0000 mg | ORAL_TABLET | Freq: Two times a day (BID) | ORAL | Status: DC

## 2011-06-21 ENCOUNTER — Other Ambulatory Visit: Payer: Self-pay | Admitting: Family Medicine

## 2011-06-21 NOTE — Telephone Encounter (Signed)
Refill request

## 2011-06-23 ENCOUNTER — Telehealth: Payer: Self-pay | Admitting: *Deleted

## 2011-06-23 NOTE — Telephone Encounter (Signed)
Patient comes to office needing refill on Hydrocodone/ Acetaminophin 10/500. Will forward to MD. Patient is out and needs as soon as possible. Rx can be faxed or called into pharmacy.

## 2011-06-24 NOTE — Telephone Encounter (Signed)
Approved and called in. Attempted to call pt with message, but mailbox for listed telephone number was full.

## 2011-06-24 NOTE — Telephone Encounter (Signed)
Called in script and left voicemail on pharmacy line. Unable to complete phone call to patient. Script should be available later this morning.

## 2011-06-27 ENCOUNTER — Encounter: Payer: Self-pay | Admitting: Family Medicine

## 2011-06-27 ENCOUNTER — Ambulatory Visit (INDEPENDENT_AMBULATORY_CARE_PROVIDER_SITE_OTHER): Payer: Medicare HMO | Admitting: Family Medicine

## 2011-06-27 VITALS — BP 107/69 | HR 70 | Temp 98.5°F | Wt 216.0 lb

## 2011-06-27 DIAGNOSIS — E11628 Type 2 diabetes mellitus with other skin complications: Secondary | ICD-10-CM

## 2011-06-27 DIAGNOSIS — L02619 Cutaneous abscess of unspecified foot: Secondary | ICD-10-CM

## 2011-06-27 DIAGNOSIS — E1169 Type 2 diabetes mellitus with other specified complication: Secondary | ICD-10-CM

## 2011-06-27 MED ORDER — SULFAMETHOXAZOLE-TMP DS 800-160 MG PO TABS: ORAL_TABLET | ORAL | Status: DC

## 2011-06-27 NOTE — Assessment & Plan Note (Signed)
Will try to treat at home with double dose, double strength trim/sulfa. He is to start right away, give it until tomorrow and is getting worse, if developing fever, or not improving will need to go to ER for admission for IV antibiotics.

## 2011-06-27 NOTE — Progress Notes (Signed)
  Subjective:    Patient ID: Leslie [name], male    DOB: [birth year], 65 y.o.   MRN: [id number]  HPI Right leg infection again, was in the hospital in July for the same problem.  Was discharged on doxycycline and improved.  This reddness started early in the week and has progressed.  He thinks he had a fever, but not evident today.  He has not idea when the infection is coming from.  [facility].  Leg is painful, back is painful, he sees Dr. [name] Blackwater for chronic issues.  He has decent glycemic control.   Review of Systems  Constitutional: Positive for fever, chills, activity change and appetite change.  Respiratory: Negative for shortness of breath.   Cardiovascular: Positive for leg swelling.  Musculoskeletal: Positive for back pain.       Objective:   Physical Exam  Constitutional:       Alert, advanced rosacea of nose  Cardiovascular: Normal rate and regular rhythm.   Pulmonary/Chest: Effort normal and breath sounds normal.  Musculoskeletal: He exhibits edema.  Skin:       Right lower extremity with 4+ edema, red and hot, consistent with cellulitis.          Assessment & Plan:

## 2011-06-27 NOTE — Patient Instructions (Addendum)
Begin the Antibiotic take at high doses with 2 tabs twice daily for 2 weeks, start asa[ Drink a ton of water If you do not see your foot getting better in 24 hours or your fever gets high you will need to go to the ER for admission If you develop a rash or sores in your mouth from the antibiotics, stop them immediately and call the doctor on call at [phone] and ask for the family doctor on call

## 2011-07-22 ENCOUNTER — Other Ambulatory Visit: Payer: Self-pay | Admitting: Family Medicine

## 2011-07-22 NOTE — Telephone Encounter (Signed)
Refill request

## 2011-07-23 NOTE — Telephone Encounter (Signed)
Patient given Rx for 90 tablets for Lortab 1 month ago.  Used all 90 tablets in a month. Patient needs appointment with Dr. [name] Blackwater for this. Last seen July 2012

## 2011-07-23 NOTE — Telephone Encounter (Signed)
And asked to follow-up in 2-3 months.  Please ask patient to follow-up in next 2 weeks to re-evaluate his pain and to get approved for getting this medication on regular basis.   Update: patient has known herniated disc. Will give 1 month's worth of Lortab.  Please ask patient follow-up with Dr. [name] Blackwater in 1 month.

## 2011-08-14 ENCOUNTER — Encounter: Payer: Self-pay | Admitting: Family Medicine

## 2011-08-14 ENCOUNTER — Ambulatory Visit (INDEPENDENT_AMBULATORY_CARE_PROVIDER_SITE_OTHER): Payer: Medicare HMO | Admitting: Family Medicine

## 2011-08-14 VITALS — BP 180/80 | HR 71 | Temp 98.0°F | Ht 64.0 in | Wt 214.0 lb

## 2011-08-14 DIAGNOSIS — E11628 Type 2 diabetes mellitus with other skin complications: Secondary | ICD-10-CM

## 2011-08-14 DIAGNOSIS — Z23 Encounter for immunization: Secondary | ICD-10-CM

## 2011-08-14 DIAGNOSIS — F1020 Alcohol dependence, uncomplicated: Secondary | ICD-10-CM

## 2011-08-14 DIAGNOSIS — E1169 Type 2 diabetes mellitus with other specified complication: Secondary | ICD-10-CM

## 2011-08-14 DIAGNOSIS — I10 Essential (primary) hypertension: Secondary | ICD-10-CM

## 2011-08-14 DIAGNOSIS — M25511 Pain in right shoulder: Secondary | ICD-10-CM | POA: Insufficient documentation

## 2011-08-14 DIAGNOSIS — L02619 Cutaneous abscess of unspecified foot: Secondary | ICD-10-CM

## 2011-08-14 DIAGNOSIS — M25519 Pain in unspecified shoulder: Secondary | ICD-10-CM

## 2011-08-14 DIAGNOSIS — M545 Low back pain: Secondary | ICD-10-CM

## 2011-08-14 DIAGNOSIS — L03119 Cellulitis of unspecified part of limb: Secondary | ICD-10-CM

## 2011-08-14 DIAGNOSIS — E118 Type 2 diabetes mellitus with unspecified complications: Secondary | ICD-10-CM

## 2011-08-14 MED ORDER — HYDROCODONE-ACETAMINOPHEN 10-500 MG PO TABS: 1.0000 | ORAL_TABLET | Freq: Three times a day (TID) | ORAL | Status: DC | PRN

## 2011-08-14 MED ORDER — KETOROLAC TROMETHAMINE 60 MG/2ML IM SOLN
60.0000 mg | Freq: Once | INTRAMUSCULAR | Status: DC
  Administered 2011-08-14 (×2): 60 mg via INTRAMUSCULAR

## 2011-08-14 MED ORDER — METFORMIN HCL 1000 MG PO TABS: 500.0000 mg | ORAL_TABLET | Freq: Two times a day (BID) | ORAL | Status: DC

## 2011-08-14 NOTE — Progress Notes (Signed)
  Subjective:    Patient ID: Leslie [name], male    DOB: 1946-10-25, 65 y.o.   MRN: [id number]  HPI 65 yo M presents for f/u to discuss the following:  1. R shoulder and R arm pain x 4 days: acute onset, has occurred before (3x in life), severe, constant. Throbbing pain ("like toothache"). Associated with R 1st-3rd finger numbness. No weakness. No CP/SOB/cough/MSK tenderness. Has not taking anything for the pain. Pain keeps him from sleeping well. This is unrelated to his chronic low back pain which he states is well controlled with his current regimen.   Chronic low back pain: taking his medications as prescribed. Swimming for weight loss, but not recently due to R shoulder pain and recent move to new apt. Able to get around just fine.   2. Diabetes Mellitus Patient presents for follow up of diabetes. Current symptoms include: none. Symptoms have stabilized. Patient denies foot ulcerations, hyperglycemia, nausea, polydipsia, polyuria, visual disturbances and vomiting. Evaluation to date has included: fasting blood sugar, fasting lipid panel and hemoglobin A1C.  Home sugars: patient does not check sugars. Current treatment: increased aerobic exercise which has been effective.  3. Hypertension Patient is here for follow-up of elevated blood pressure. He is exercising and is not adherent to a low-salt diet. Blood pressure unclear if  well controlled at home. Cardiac symptoms: none. Patient denies chest pain and dyspnea. Cardiovascular risk factors: advanced age (older than 33 for men, 36 for women), diabetes mellitus, dyslipidemia, hypertension, male gender, obesity (BMI >= 30 kg/m2) and smoking/ tobacco exposure. Use of agents associated with hypertension: none. History of target organ damage: none.  4. RLE Cellulitis: completed abx. No pain, no redness. Mild edema.   5. ETOH: down to 6 beers/day. Feels good about current drinking. No smoking. No IDU.   Reviewed and updated pertinent past medical  history, medications and social history.   Review of Systems   13 system ROS negative except as per HPI Objective:   Physical Exam  Nursing note and vitals reviewed. Constitutional: He is oriented to person, place, and time. He appears well-developed and well-nourished. No distress.       Obese  Cardiovascular: Normal rate, regular rhythm and normal heart sounds.   Pulmonary/Chest: Effort normal and breath sounds normal.  Musculoskeletal: Normal range of motion.  Neurological: He is alert and oriented to person, place, and time. He has normal strength.       RUE: full ROM in shoulder, 5/5 strength. Nontender to palpation. 2+ radial pulses. 5//5 strength in hand .Sensation intact.   Skin: Skin is warm and dry.          Assessment & Plan:

## 2011-08-14 NOTE — Assessment & Plan Note (Signed)
A: completed abx. Cellulitis resolved.  P: resolved.

## 2011-08-14 NOTE — Assessment & Plan Note (Signed)
A: Nerve impingement in R neck. Considered MI/pulm dz but no other signs or symptoms to support this. No focal neuro deficits except for subjective numbness. P: NSAIDs, heat, exercise.

## 2011-08-14 NOTE — Progress Notes (Signed)
Addended by: Levert Feinstein F on: 08/14/2011 05:07 PM   Modules accepted: Orders

## 2011-08-14 NOTE — Patient Instructions (Signed)
[name],   Thank you for coming in today. For you shoulder: and take Motrin 600 mg  Every 6 hrs. Apply heat to your arm and chest for about 20 minutes 2-3x/day.  Get back to exercise when able.   Decrease metformin to 500 mg (1/2 tabs) twice daily since your A1c is 5.8!   See me in 4 months or sooner if needed. Call me if your pain does not improve.  -Dr. [name] Blackwater

## 2011-08-14 NOTE — Assessment & Plan Note (Signed)
Chronic, > 5 yrs. 2/2 to hernaited disc. Controlled. Pt still able to perform ADLs. Plan: analgesia, weight loss and core strengthening.

## 2011-08-14 NOTE — Assessment & Plan Note (Signed)
A: unchanged. 6 beers/night. No plan to decrease. P: address q visit. Encourage cut back.

## 2011-08-14 NOTE — Assessment & Plan Note (Signed)
A: BP elevated. Lower when rechecked. ? Pain response vs. chornically elevated. Meds: compliant with meds. No side effects. P: continue current regimen. Address pain. Encourage back to exercise. RTC for BP check in 1-2 weeks.

## 2011-08-14 NOTE — Assessment & Plan Note (Signed)
A: Well controlled. A1c low. Meds: compliant with medications and tolerating.  P: 1/2 dose of metformin. Encourage continued exercise. Check A1c in 3 months.

## 2011-09-03 ENCOUNTER — Encounter: Payer: Self-pay | Admitting: Home Health Services

## 2011-11-12 ENCOUNTER — Ambulatory Visit (INDEPENDENT_AMBULATORY_CARE_PROVIDER_SITE_OTHER): Payer: Medicare HMO | Admitting: Family Medicine

## 2011-11-12 ENCOUNTER — Ambulatory Visit (HOSPITAL_COMMUNITY)
Admission: RE | Admit: 2011-11-12 | Discharge: 2011-11-12 | Disposition: A | Discharge status: Home / Self Care | Payer: Medicare HMO | Source: Ambulatory Visit | Attending: Cardiology | Admitting: Cardiology

## 2011-11-12 ENCOUNTER — Other Ambulatory Visit: Payer: Self-pay

## 2011-11-12 DIAGNOSIS — M545 Low back pain: Secondary | ICD-10-CM

## 2011-11-12 DIAGNOSIS — R079 Chest pain, unspecified: Secondary | ICD-10-CM

## 2011-11-12 DIAGNOSIS — E118 Type 2 diabetes mellitus with unspecified complications: Secondary | ICD-10-CM

## 2011-11-12 DIAGNOSIS — E119 Type 2 diabetes mellitus without complications: Secondary | ICD-10-CM

## 2011-11-12 MED ORDER — FLUTICASONE-SALMETEROL 250-50 MCG/DOSE IN AEPB: 1.0000 | INHALATION_SPRAY | Freq: Two times a day (BID) | RESPIRATORY_TRACT | Status: DC

## 2011-11-12 MED ORDER — HYDROCODONE-ACETAMINOPHEN 10-500 MG PO TABS: 1.0000 | ORAL_TABLET | Freq: Three times a day (TID) | ORAL | Status: DC | PRN

## 2011-11-12 MED ORDER — ALBUTEROL SULFATE HFA 108 (90 BASE) MCG/ACT IN AERS: 2.0000 | INHALATION_SPRAY | RESPIRATORY_TRACT | Status: DC | PRN

## 2011-11-12 MED ORDER — NITROGLYCERIN 0.4 MG SL SUBL: 0.4000 mg | SUBLINGUAL_TABLET | SUBLINGUAL | Status: DC | PRN

## 2011-11-12 NOTE — Patient Instructions (Signed)
Mr. Conwill,  Your chest pain does not sound like your heart but to be sure, I will refer you back to the cardiologist. Dr. Jamal Collin has retired, so it will be a different one. Expect a call from my nurses about the referral.  Please pick up and take the new nitro as needed for chest pain up to 3 doses, if the pain does not resolve, call EMS.   See me in 2 months Dr. [name] Blackwater

## 2011-11-13 ENCOUNTER — Telehealth: Payer: Self-pay | Admitting: Family Medicine

## 2011-11-13 DIAGNOSIS — E118 Type 2 diabetes mellitus with unspecified complications: Secondary | ICD-10-CM

## 2011-11-13 LAB — COMPREHENSIVE METABOLIC PANEL
Albumin: 4.5 g/dL (ref 3.5–5.2)
CO2: 22 mEq/L (ref 19–32)
Calcium: 9.4 mg/dL (ref 8.4–10.5)
Glucose, Bld: 167 mg/dL — ABNORMAL HIGH (ref 70–99)
Sodium: 135 mEq/L (ref 135–145)
Total Bilirubin: 0.3 mg/dL (ref 0.3–1.2)
Total Protein: 7.2 g/dL (ref 6.0–8.3)

## 2011-11-13 NOTE — Telephone Encounter (Signed)
Pt calling to say that his rx for the hydrocodone was prescribed incorrectly.  He has been taking med three times daily and the rx he just filled said once daily.  He would like for Dr. [name] Blackwater to correct this so he can take his med the way he use to.

## 2011-11-14 MED ORDER — METFORMIN HCL 1000 MG PO TABS: 1000.0000 mg | ORAL_TABLET | Freq: Two times a day (BID) | ORAL | Status: DC

## 2011-11-14 NOTE — Telephone Encounter (Signed)
Called pt at home. Script was correct. It was for a one month supply. 1 tab q 8, disp 90. He thought it was for a 3 mos supply, but I did not give him a 3 mos supply b/c he had trouble with the prescriptions last time, pharmacy not willing to hold scripts and fill later.  Also told pt about labs. A1c up to 6.8. Asked pt to increase metformin to 1000 mg ( 1 tab) PO BID.

## 2011-11-17 ENCOUNTER — Encounter: Payer: Self-pay | Admitting: Family Medicine

## 2011-11-17 DIAGNOSIS — R079 Chest pain, unspecified: Secondary | ICD-10-CM | POA: Insufficient documentation

## 2011-11-17 NOTE — Assessment & Plan Note (Signed)
A: Chest pain, suspected etiology: GERD but he has many cardiac risk factors including a history of chest pain.  Patient history and exam consistent with non-cardiac cause of chest pain.Precepted with Dr. Dorcas Mcmurray.  P: -Conservative measures indicated. -Cardiology consultation. Nitroglycernin script provided. Continue daily aspirin.

## 2011-11-17 NOTE — Progress Notes (Signed)
Patient ID: Leslie [name], male   DOB: 01-06-1946, 66 y.o.   MRN: [id number] Subjective:    Leslie [name] is a 66 y.o. male who presents for evaluation of chest pain. Onset was 12 hours ago. The pain woke him up from his sleep. Symptoms have resolved since that time. The patient describes the pain as sharp and does not radiate. Patient rates pain as a 5/10 in intensity. Associated symptoms are: none. Aggravating factors are: none. Alleviating factors are: nitroglycerin 20 (66 year old) tablets. Patient's cardiac risk factors are: advanced age (older than 27 for men, 64 for women), diabetes mellitus, dyslipidemia, hypertension, male gender, obesity (BMI >= 30 kg/m2), sedentary lifestyle and previous smoker quit 10 yrs ago.. Patient's risk factors for DVT/PE: none. 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   Review of Systems Pertinent items are noted in HPI.    Objective:   Filed Vitals:   11/12/11 1500  BP: 134/77  Pulse: 80  Temp: 97.8 F (36.6 C)  Resp: 20     General appearance: alert, cooperative and no distress Neck: no adenopathy, no JVD and supple, symmetrical, trachea midline Lungs: clear to auscultation bilaterally Chest wall: no tenderness. Thick well circumscribed plaque along R chest. Erythema over sternum no discrete rash no ulcer, vesicles or other lesions.  Heart: regular rate and rhythm, S1, S2 normal, no murmur, click, rub or gallop Extremities: extremities normal, atraumatic, no cyanosis or edema  Cardiographics ECG: poor baseline (wavy)m NSR with HR 75, normal axis, no ST elevataion or depression. QTc 439.    Assessment:    Chest pain, suspected etiology: GERD but he has many cardiac risk factors including a history of chest pain. Precepted with Dr. Dorcas Mcmurray.    Plan:    Patient history and exam consistent with non-cardiac cause of chest  pain. Conservative measures indicated. Cardiology consultation. Nitroglycernin script provided. Continue daily aspirin.

## 2011-12-02 ENCOUNTER — Ambulatory Visit (INDEPENDENT_AMBULATORY_CARE_PROVIDER_SITE_OTHER): Payer: Medicare HMO | Admitting: Cardiovascular Disease

## 2011-12-02 ENCOUNTER — Encounter: Payer: Self-pay | Admitting: Cardiovascular Disease

## 2011-12-02 DIAGNOSIS — E785 Hyperlipidemia, unspecified: Secondary | ICD-10-CM

## 2011-12-02 DIAGNOSIS — F1020 Alcohol dependence, uncomplicated: Secondary | ICD-10-CM

## 2011-12-02 DIAGNOSIS — R079 Chest pain, unspecified: Secondary | ICD-10-CM

## 2011-12-02 NOTE — Progress Notes (Signed)
HPI:  This is a 66 year old gentleman presenting for evaluation of chest pain.  The patient had an episode of left-sided sharp pain in his chest about 3 weeks ago. The episode was unrelated to physical exertion. It was nonradiating. He specifically denied pain in the substernal region, neck pain, shoulder or arm pain. There was no pressure like sensation present. There is no pain with exertion. He denies dyspnea, edema, orthopnea, or PND. The patient has long-standing diabetes. He had a stress test over several years ago that was reportedly normal. He feels back to his baseline health with no recurrent chest pain and has no complaints at this point.  Outpatient Encounter Prescriptions as of 12/02/2011  Medication Sig Dispense Refill  . albuterol (VENTOLIN HFA) 108 (90 BASE) MCG/ACT inhaler Inhale 2 puffs into the lungs every 4 (four) hours as needed.  2 Inhaler  11  . allopurinol (ZYLOPRIM) 300 MG tablet Take 1 tablet (300 mg total) by mouth daily.  90 tablet  2  . amLODipine (NORVASC) 10 MG tablet Take 1 tablet (10 mg total) by mouth daily.  90 tablet  2  . aspirin 81 MG EC tablet Take 81 mg by mouth daily.        Marland Kitchen esomeprazole (NEXIUM) 40 MG capsule Take 1 capsule (40 mg total) by mouth daily.  90 capsule  2  . Fluticasone-Salmeterol (ADVAIR DISKUS) 250-50 MCG/DOSE AEPB Inhale 1 puff into the lungs 2 (two) times daily.  60 each  11  . furosemide (LASIX) 20 MG tablet Take 1 tablet (20 mg total) by mouth daily.  90 tablet  2  . glipiZIDE (GLUCOTROL) 10 MG tablet Take 1 tablet (10 mg total) by mouth every morning.  90 tablet  2  . HYDROcodone-acetaminophen (LORTAB) 10-500 MG per tablet Take 1 tablet by mouth every 8 (eight) hours as needed for pain.  90 tablet  0  . losartan (COZAAR) 50 MG tablet Take 1 tablet (50 mg total) by mouth daily. Take instead of the avapro.  90 tablet  2  . metFORMIN (GLUCOPHAGE) 1000 MG tablet Take 1 tablet (1,000 mg total) by mouth 2 (two) times daily with a meal.  180 tablet   2  . metoprolol (LOPRESSOR) 100 MG tablet Take 2 tablets (200 mg total) by mouth 2 (two) times daily. Take 2 tabs twice a day  360 tablet  2  . nitroGLYCERIN (NITROSTAT) 0.4 MG SL tablet Place 1 tablet (0.4 mg total) under the tongue every 5 (five) minutes as needed for chest pain.  50 tablet  3  . pravastatin (PRAVACHOL) 80 MG tablet Take 1 tablet (80 mg total) by mouth every evening.  90 tablet  2    Review of patient's allergies indicates no known allergies.  Past Medical History  Diagnosis Date  . Gout   . Asthma   . COPD (chronic obstructive pulmonary disease)   . Diabetes mellitus     Diagnosed in 2002  . GERD (gastroesophageal reflux disease)   . Hyperlipidemia   . Hypertension   . Substance abuse     No past surgical history on file.  History   Social History  . Marital Status: Widowed    Spouse Name: N/A    Number of Children: N/A  . Years of Education: N/A   Occupational History  . disabled    Social History Main Topics  . Smoking status: Former Smoker    Types: Cigarettes    Quit date: 01/13/2002  . Smokeless tobacco:  Never Used  . Alcohol Use: 14.0 oz/week    28 drink(s) per week     Drinks 4+ beers per night.   . Drug Use: Not on file  . Sexually Active: Not on file   Other Topics Concern  . Not on file   Social History Narrative   Disabled--former Architectural technologist.Widowed.5-8 years educationRents home, owns a car >4 beers/dayLikes to fishPets:  4 cockatiels and 2 lovebirds    Family history: There is no premature coronary artery disease or history of myocardial infarction in the family.  ROS: General: no fevers/chills/night sweats Eyes: no blurry vision, diplopia, or amaurosis ENT: no sore throat or hearing loss Resp: no cough, wheezing, or hemoptysis CV: no edema or palpitations GI: no abdominal pain, nausea, vomiting, diarrhea, or constipation GU: no dysuria, frequency, or hematuria Skin: no rash Neuro: no headache, numbness, tingling,  or weakness of extremities Musculoskeletal: no joint pain or swelling. The patient is limited by chronic back pain. Heme: no bleeding, DVT, or easy bruising Endo: no polydipsia or polyuria  BP 128/70  Pulse 68  Ht 5\' 6"  (1.676 m)  Wt 101.152 kg (223 lb)  BMI 35.99 kg/m2  PHYSICAL EXAM: Pt is alert and oriented, WD, WN, in no distress. HEENT: advanced rosacea of the nose Neck: JVP normal. Carotid upstrokes normal without bruits. No thyromegaly. Lungs: equal expansion, clear bilaterally CV: Apex is discrete and nondisplaced, RRR without murmur or gallop Abd: soft, NT, +BS, no bruit, obese Back: no CVA tenderness Ext: no C/C/E        Femoral pulses 2+= without bruits Skin: warm and dry without rash Neuro: CNII-XII intact             Strength intact = bilaterally  EKG:  Reviewed from January 16. This is within normal limits and shows normal sinus rhythm.  ASSESSMENT AND PLAN:

## 2011-12-02 NOTE — Patient Instructions (Signed)
Your physician recommends that you schedule a follow-up appointment as needed.   Your physician recommends that you continue on your current medications as directed. Please refer to the Current Medication list given to you today.  

## 2011-12-02 NOTE — Assessment & Plan Note (Signed)
The patient had an episode of atypical chest pain. While he has multiple cardiovascular risk factors including diabetes, this episode seemed fairly limited and he has had no recurrence. We discussed stress testing but the patient is reluctant to move forward with this because of financial concerns. I recommended continued medical therapy and the patient will contact our office if his chest pain recurs. If he has recurrent chest pain I would be inclined to perform stress testing to rule out ischemic heart disease. His normal twelve-lead EKG is reassuring.

## 2011-12-02 NOTE — Assessment & Plan Note (Signed)
He is on a statin drug and his LDL is less than 100.

## 2011-12-02 NOTE — Assessment & Plan Note (Signed)
The patient drinks alcohol heavily. He reports about 8 beers per day to me. We discussed the importance of reducing his alcohol intake.

## 2011-12-22 ENCOUNTER — Other Ambulatory Visit: Payer: Self-pay | Admitting: Family Medicine

## 2011-12-22 DIAGNOSIS — M545 Low back pain: Secondary | ICD-10-CM

## 2011-12-22 MED ORDER — HYDROCODONE-ACETAMINOPHEN 10-500 MG PO TABS: 1.0000 | ORAL_TABLET | Freq: Three times a day (TID) | ORAL | Status: DC | PRN

## 2011-12-22 NOTE — Telephone Encounter (Signed)
Pt informed. Marks Scalera Dawn  

## 2011-12-22 NOTE — Telephone Encounter (Signed)
Script placed up front and ready for pick up. Please let patient know.

## 2011-12-22 NOTE — Telephone Encounter (Signed)
Patient is calling for a refill on his Hydrocodone.  Please call him when it is ready to be picked up.

## 2012-01-08 ENCOUNTER — Ambulatory Visit (INDEPENDENT_AMBULATORY_CARE_PROVIDER_SITE_OTHER): Payer: Medicare HMO | Admitting: Family Medicine

## 2012-01-08 ENCOUNTER — Encounter: Payer: Self-pay | Admitting: Family Medicine

## 2012-01-08 VITALS — BP 160/80 | HR 68 | Temp 98.0°F | Ht 66.0 in | Wt 227.0 lb

## 2012-01-08 DIAGNOSIS — L089 Local infection of the skin and subcutaneous tissue, unspecified: Secondary | ICD-10-CM

## 2012-01-08 MED ORDER — MUPIROCIN CALCIUM 2 % EX CREA: TOPICAL_CREAM | Freq: Three times a day (TID) | CUTANEOUS | Status: DC

## 2012-01-08 MED ORDER — MINOCYCLINE HCL 100 MG PO TABS: 100.0000 mg | ORAL_TABLET | Freq: Two times a day (BID) | ORAL | Status: AC

## 2012-01-08 NOTE — Patient Instructions (Signed)
Thank you for coming in today. Take the minocycline twice a day. Use and Epsom salt soak on your foot 2-3 times a day Apply antibiotic cream after the soaks. Keep your foot clean. Followup with Dr. [name] Blackwater on Monday. Call if your foot gets worse or you have a high fever

## 2012-01-09 ENCOUNTER — Encounter: Payer: Self-pay | Admitting: Family Medicine

## 2012-01-09 NOTE — Progress Notes (Signed)
Leslie [name] is a 66 y.o. male who presents to [facility] today for left big toe infection.  Patient noted that he has had swelling and some bleeding of the base of the toenail of his left big toe started a few days ago.  He does not feel his toes well so it does not hurt.  He would like it checked out today.  He denies any fevers or chills.   PMH reviewed. Significant for multiple medical problems including diabetes Medications reviewed. Current Outpatient Prescriptions  Medication Sig Dispense Refill  . albuterol (VENTOLIN HFA) 108 (90 BASE) MCG/ACT inhaler Inhale 2 puffs into the lungs every 4 (four) hours as needed.  2 Inhaler  11  . allopurinol (ZYLOPRIM) 300 MG tablet Take 1 tablet (300 mg total) by mouth daily.  90 tablet  2  . amLODipine (NORVASC) 10 MG tablet Take 1 tablet (10 mg total) by mouth daily.  90 tablet  2  . aspirin 81 MG EC tablet Take 81 mg by mouth daily.        Marland Kitchen esomeprazole (NEXIUM) 40 MG capsule Take 1 capsule (40 mg total) by mouth daily.  90 capsule  2  . Fluticasone-Salmeterol (ADVAIR DISKUS) 250-50 MCG/DOSE AEPB Inhale 1 puff into the lungs 2 (two) times daily.  60 each  11  . furosemide (LASIX) 20 MG tablet Take 1 tablet (20 mg total) by mouth daily.  90 tablet  2  . glipiZIDE (GLUCOTROL) 10 MG tablet Take 1 tablet (10 mg total) by mouth every morning.  90 tablet  2  . HYDROcodone-acetaminophen (LORTAB) 10-500 MG per tablet Take 1 tablet by mouth every 8 (eight) hours as needed for pain.  90 tablet  0  . losartan (COZAAR) 50 MG tablet Take 1 tablet (50 mg total) by mouth daily. Take instead of the avapro.  90 tablet  2  . metFORMIN (GLUCOPHAGE) 1000 MG tablet Take 1 tablet (1,000 mg total) by mouth 2 (two) times daily with a meal.  180 tablet  2  . metoprolol (LOPRESSOR) 100 MG tablet Take 2 tablets (200 mg total) by mouth 2 (two) times daily. Take 2 tabs twice a day  360 tablet  2  . mupirocin cream (BACTROBAN) 2 % Apply topically 3 (three) times daily.  15 g  0  .  nitroGLYCERIN (NITROSTAT) 0.4 MG SL tablet Place 1 tablet (0.4 mg total) under the tongue every 5 (five) minutes as needed for chest pain.  50 tablet  3  . pravastatin (PRAVACHOL) 80 MG tablet Take 1 tablet (80 mg total) by mouth every evening.  90 tablet  2  . minocycline (DYNACIN) 100 MG tablet Take 1 tablet (100 mg total) by mouth 2 (two) times daily.  14 tablet  0    Exam:  BP 160/80  Pulse 68  Temp(Src) 98 F (36.7 C) (Oral)  Ht 5\' 6"  (1.676 m)  Wt 227 lb (102.967 kg)  BMI 36.64 kg/m2 Gen: Well NAD Toe: Nail hypertrophy on left foot.  Left great toe base with redness and expressible pus.  Erythema does not extend beyond the interphalangeal joint.  Toe is nontender.

## 2012-01-09 NOTE — Assessment & Plan Note (Signed)
Mild cellulitis of the base of the toe.  His onychomycosis as a risk factor for this, as is his overall nail hypertrophy and poor health.   Plan to prescribe minocycline Orally and topical Bactroban.  Additionally will recommend Epsom salt soaks to allow for drainage. The toe is mildly passively draining therefore I feel that incision and drainage of the small potential collection is not ideal. Additionally patient would have very hard time healing any wounds I make on his feet. Plan to followup Monday or Tuesday with primary care doctor

## 2012-01-16 ENCOUNTER — Encounter: Payer: Self-pay | Admitting: Family Medicine

## 2012-01-16 ENCOUNTER — Ambulatory Visit (INDEPENDENT_AMBULATORY_CARE_PROVIDER_SITE_OTHER): Payer: Medicare HMO | Admitting: Family Medicine

## 2012-01-16 VITALS — BP 133/84 | HR 73 | Temp 97.6°F | Ht 66.0 in | Wt 221.0 lb

## 2012-01-16 DIAGNOSIS — E1149 Type 2 diabetes mellitus with other diabetic neurological complication: Secondary | ICD-10-CM

## 2012-01-16 DIAGNOSIS — L608 Other nail disorders: Secondary | ICD-10-CM | POA: Insufficient documentation

## 2012-01-16 DIAGNOSIS — L609 Nail disorder, unspecified: Secondary | ICD-10-CM

## 2012-01-16 DIAGNOSIS — L089 Local infection of the skin and subcutaneous tissue, unspecified: Secondary | ICD-10-CM

## 2012-01-16 DIAGNOSIS — E1142 Type 2 diabetes mellitus with diabetic polyneuropathy: Secondary | ICD-10-CM

## 2012-01-16 NOTE — Progress Notes (Signed)
Leslie [name] is a 66 y.o. male who presents to [facility] today for followup of his left great toe cellulitis.  Was seen on March 15 for cellulitis of the proximal nail fold of the left great toe.  Was prescribed minocycline, Bactroban cream and foot soaks.  He has been compliant with the prescribed therapy and feels like his foot is improved.  He denies any pain fevers chills worsening swelling or redness.  He is just about to finish his minocycline.    PMH reviewed. Significant for multiple medical problems including diabetes with significant peripheral vascular disease and onychomycosis of the toes ROS as above otherwise neg Medications reviewed. Current Outpatient Prescriptions  Medication Sig Dispense Refill  . albuterol (VENTOLIN HFA) 108 (90 BASE) MCG/ACT inhaler Inhale 2 puffs into the lungs every 4 (four) hours as needed.  2 Inhaler  11  . allopurinol (ZYLOPRIM) 300 MG tablet Take 1 tablet (300 mg total) by mouth daily.  90 tablet  2  . amLODipine (NORVASC) 10 MG tablet Take 1 tablet (10 mg total) by mouth daily.  90 tablet  2  . aspirin 81 MG EC tablet Take 81 mg by mouth daily.        Marland Kitchen esomeprazole (NEXIUM) 40 MG capsule Take 1 capsule (40 mg total) by mouth daily.  90 capsule  2  . Fluticasone-Salmeterol (ADVAIR DISKUS) 250-50 MCG/DOSE AEPB Inhale 1 puff into the lungs 2 (two) times daily.  60 each  11  . furosemide (LASIX) 20 MG tablet Take 1 tablet (20 mg total) by mouth daily.  90 tablet  2  . glipiZIDE (GLUCOTROL) 10 MG tablet Take 1 tablet (10 mg total) by mouth every morning.  90 tablet  2  . HYDROcodone-acetaminophen (LORTAB) 10-500 MG per tablet Take 1 tablet by mouth every 8 (eight) hours as needed for pain.  90 tablet  0  . losartan (COZAAR) 50 MG tablet Take 1 tablet (50 mg total) by mouth daily. Take instead of the avapro.  90 tablet  2  . metFORMIN (GLUCOPHAGE) 1000 MG tablet Take 1 tablet (1,000 mg total) by mouth 2 (two) times daily with a meal.  180 tablet  2  . metoprolol  (LOPRESSOR) 100 MG tablet Take 2 tablets (200 mg total) by mouth 2 (two) times daily. Take 2 tabs twice a day  360 tablet  2  . minocycline (DYNACIN) 100 MG tablet Take 1 tablet (100 mg total) by mouth 2 (two) times daily.  14 tablet  0  . mupirocin cream (BACTROBAN) 2 % Apply topically 3 (three) times daily.      . nitroGLYCERIN (NITROSTAT) 0.4 MG SL tablet Place 1 tablet (0.4 mg total) under the tongue every 5 (five) minutes as needed for chest pain.  50 tablet  3  . pravastatin (PRAVACHOL) 80 MG tablet Take 1 tablet (80 mg total) by mouth every evening.  90 tablet  2    Exam:  BP 133/84  Pulse 73  Temp(Src) 97.6 F (36.4 C) (Oral)  Ht 5\' 6"  (1.676 m)  Wt 221 lb (100.245 kg)  BMI 35.67 kg/m2 Gen: Well NAD Feet: Reduced swelling of the proximal nail fold of the left great toe but some residual erythema.  No exudate expressible.  However with exam dead skin is present in the nail fold.  It is foul-smelling.  He has overgrowth of all nails of the left foot with thickening and curling.   Nail trimming: Consent obtained and timeout performed.  Using a  large nail scissors I debrided the second portion of the nails on the left foot.  Some bleeding of the fourth third and first nail.  The great toe had significant dead nail and most of the nail was removed easily without any blood loss.  Small amount of bleeding that was easily controlled.  The underlying skin was intact under the great toe.  Patient did well

## 2012-01-16 NOTE — Assessment & Plan Note (Signed)
This is likely a toe infection due to nail overgrowth and onychomycosis.  I debrided the nails on the left foot.  I felt the need to do this because the patient has not been able to care for his own feet, nor willing to go to a podiatrist.  I feel that trimming the nails reduces the chance of further nail infection and we'll allow this infection to resolve quicker.  We discussed the risks and benefits, including infection and he expresses a willingness to proceed.    For the infection I encouraged him to followup with his primary care doctor on Monday or Tuesday and continue the Bactroban cream and nail soaks.  Additionally I discussed warning signs or symptoms.  He expresses understanding.

## 2012-01-16 NOTE — Assessment & Plan Note (Signed)
Patient's toenail infection is likely secondary to his significant diabetic neuropathy.

## 2012-01-16 NOTE — Patient Instructions (Signed)
Thank you for coming in today. Continue the soaks and applying the antibiotic cream.  See a doctor on Monday or Tuesday, either Dr. [name] Blackwater or Georgina Snell.  Let us know if you feet worsen or you have a fever or chills.

## 2012-01-16 NOTE — Assessment & Plan Note (Signed)
Overgrowth and onychomycosis. Nails trimmed today

## 2012-01-21 ENCOUNTER — Encounter: Payer: Self-pay | Admitting: Family Medicine

## 2012-01-21 ENCOUNTER — Ambulatory Visit (INDEPENDENT_AMBULATORY_CARE_PROVIDER_SITE_OTHER): Payer: Medicare HMO | Admitting: Family Medicine

## 2012-01-21 VITALS — BP 164/88 | HR 71 | Temp 98.1°F | Ht 66.0 in | Wt 221.0 lb

## 2012-01-21 DIAGNOSIS — L089 Local infection of the skin and subcutaneous tissue, unspecified: Secondary | ICD-10-CM

## 2012-01-21 DIAGNOSIS — M545 Low back pain: Secondary | ICD-10-CM

## 2012-01-21 MED ORDER — HYDROCODONE-ACETAMINOPHEN 10-500 MG PO TABS: 1.0000 | ORAL_TABLET | Freq: Three times a day (TID) | ORAL | Status: DC | PRN

## 2012-01-21 NOTE — Assessment & Plan Note (Signed)
Chronic low back pain managed with hydrocodone. His pain medicines are due for a refill today. As he has been unable to schedule with his primary care doctor I will do a two-week one time only refill of his Vicodin and he can followup with Dr. [name] Blackwater.  He is disappointed but expresses understanding.

## 2012-01-21 NOTE — Patient Instructions (Signed)
Thank you for coming in today. Please follow up with Dr. [name] Blackwater some time in a month or so.  I will give you a refill of your hydrocodone for 2 weeks. You need to make an appointment with your PCP. She is in town and in the clinic this afternoon.  Let me or your doctor know if you get worse.

## 2012-01-21 NOTE — Assessment & Plan Note (Signed)
Much improved to resolved. Plan to continue the antibiotic cream and told runs out and foot soaks as needed. I recommended he followup with his primary care doctor for further surveillance.

## 2012-01-21 NOTE — Progress Notes (Signed)
Leslie [name] is a 66 y.o. male who presents to [facility] today for followup of his left great toe cellulitis.  Was seen on March 22 for cellulitis of the proximal nail fold of the left great toe. His toenails were debrided that time, and Bactroban cream and foot soaks were continued.  He has been compliant with the prescribed therapy and feels like his foot is improved.  He denies any pain fevers chills worsening swelling or redness.    He has an empty bottle of Vicodin and would like that refilled today. He says he has been unable to schedule with Dr. [name] Blackwater anytime recently.  His pain is reasonably well-controlled but he is relying on Vicodin for low back pain. This is a monthly medicine refill.   PMH reviewed. Significant for multiple medical problems including diabetes with significant peripheral vascular disease and onychomycosis of the toes ROS as above otherwise neg Medications reviewed. Current Outpatient Prescriptions  Medication Sig Dispense Refill  . albuterol (VENTOLIN HFA) 108 (90 BASE) MCG/ACT inhaler Inhale 2 puffs into the lungs every 4 (four) hours as needed.  2 Inhaler  11  . allopurinol (ZYLOPRIM) 300 MG tablet Take 1 tablet (300 mg total) by mouth daily.  90 tablet  2  . amLODipine (NORVASC) 10 MG tablet Take 1 tablet (10 mg total) by mouth daily.  90 tablet  2  . aspirin 81 MG EC tablet Take 81 mg by mouth daily.        Marland Kitchen esomeprazole (NEXIUM) 40 MG capsule Take 1 capsule (40 mg total) by mouth daily.  90 capsule  2  . Fluticasone-Salmeterol (ADVAIR DISKUS) 250-50 MCG/DOSE AEPB Inhale 1 puff into the lungs 2 (two) times daily.  60 each  11  . furosemide (LASIX) 20 MG tablet Take 1 tablet (20 mg total) by mouth daily.  90 tablet  2  . glipiZIDE (GLUCOTROL) 10 MG tablet Take 1 tablet (10 mg total) by mouth every morning.  90 tablet  2  . HYDROcodone-acetaminophen (LORTAB) 10-500 MG per tablet Take 1 tablet by mouth every 8 (eight) hours as needed for pain.  45 tablet  0  .  losartan (COZAAR) 50 MG tablet Take 1 tablet (50 mg total) by mouth daily. Take instead of the avapro.  90 tablet  2  . metFORMIN (GLUCOPHAGE) 1000 MG tablet Take 1 tablet (1,000 mg total) by mouth 2 (two) times daily with a meal.  180 tablet  2  . metoprolol (LOPRESSOR) 100 MG tablet Take 2 tablets (200 mg total) by mouth 2 (two) times daily. Take 2 tabs twice a day  360 tablet  2  . minocycline (DYNACIN) 100 MG tablet Take 1 tablet (100 mg total) by mouth 2 (two) times daily.  14 tablet  0  . mupirocin cream (BACTROBAN) 2 % Apply topically 3 (three) times daily.      . nitroGLYCERIN (NITROSTAT) 0.4 MG SL tablet Place 1 tablet (0.4 mg total) under the tongue every 5 (five) minutes as needed for chest pain.  50 tablet  3  . pravastatin (PRAVACHOL) 80 MG tablet Take 1 tablet (80 mg total) by mouth every evening.  90 tablet  2  . DISCONTD: HYDROcodone-acetaminophen (LORTAB) 10-500 MG per tablet Take 1 tablet by mouth every 8 (eight) hours as needed for pain.  90 tablet  0    Exam:  BP 164/88  Pulse 71  Temp(Src) 98.1 F (36.7 C) (Oral)  Ht 5\' 6"  (1.676 m)  Wt 221 lb (  100.245 kg)  BMI 35.67 kg/m2 Gen: Well NAD Feet: Reduced swelling of the proximal nail fold of the left great toe with no residual erythema.  No exudate expressible.   No bleeding.   Gait: Able to get up and down from chair normally and walk with a cane. Spinal midline is nontender.

## 2012-02-02 ENCOUNTER — Other Ambulatory Visit: Payer: Self-pay | Admitting: Family Medicine

## 2012-02-02 DIAGNOSIS — M545 Low back pain: Secondary | ICD-10-CM

## 2012-02-02 MED ORDER — HYDROCODONE-ACETAMINOPHEN 10-500 MG PO TABS: 1.0000 | ORAL_TABLET | Freq: Four times a day (QID) | ORAL | Status: DC | PRN

## 2012-02-02 MED ORDER — HYDROCODONE-ACETAMINOPHEN 10-500 MG PO TABS: 1.0000 | ORAL_TABLET | Freq: Three times a day (TID) | ORAL | Status: DC | PRN

## 2012-02-02 NOTE — Telephone Encounter (Signed)
Called patient at home. Vicodin was filled on 01/21/12 he received a half dose. He has about 20 pills left (enough for one week).  Plan to refill on 02/09/12 with 90 (30 day supply) and have additional refills for q 15th.   Will print scripts with start date for 02/09/12 and place up front to be picked up.

## 2012-02-02 NOTE — Telephone Encounter (Signed)
Patient is calling for a refill on his Hydrocodone to go to [facility] on [address].

## 2012-02-05 ENCOUNTER — Other Ambulatory Visit: Payer: Self-pay | Admitting: Family Medicine

## 2012-05-07 ENCOUNTER — Telehealth: Payer: Self-pay | Admitting: *Deleted

## 2012-05-07 ENCOUNTER — Ambulatory Visit (INDEPENDENT_AMBULATORY_CARE_PROVIDER_SITE_OTHER): Payer: Medicare HMO | Admitting: Family Medicine

## 2012-05-07 ENCOUNTER — Encounter: Payer: Self-pay | Admitting: Family Medicine

## 2012-05-07 VITALS — BP 134/77 | HR 70 | Temp 97.9°F | Ht 66.0 in | Wt 227.8 lb

## 2012-05-07 DIAGNOSIS — B354 Tinea corporis: Secondary | ICD-10-CM

## 2012-05-07 DIAGNOSIS — H6120 Impacted cerumen, unspecified ear: Secondary | ICD-10-CM

## 2012-05-07 DIAGNOSIS — E118 Type 2 diabetes mellitus with unspecified complications: Secondary | ICD-10-CM

## 2012-05-07 DIAGNOSIS — Z79899 Other long term (current) drug therapy: Secondary | ICD-10-CM

## 2012-05-07 DIAGNOSIS — IMO0002 Reserved for concepts with insufficient information to code with codable children: Secondary | ICD-10-CM

## 2012-05-07 DIAGNOSIS — M545 Low back pain: Secondary | ICD-10-CM

## 2012-05-07 LAB — POCT GLYCOSYLATED HEMOGLOBIN (HGB A1C): Hemoglobin A1C: 6.5

## 2012-05-07 MED ORDER — HYDROCODONE-ACETAMINOPHEN 10-500 MG PO TABS: 1.0000 | ORAL_TABLET | Freq: Three times a day (TID) | ORAL | Status: DC | PRN

## 2012-05-07 MED ORDER — KETOCONAZOLE 2 % EX CREA: TOPICAL_CREAM | Freq: Two times a day (BID) | CUTANEOUS | Status: DC

## 2012-05-07 MED ORDER — HYDROCODONE-ACETAMINOPHEN 5-500 MG PO TABS: 1.0000 | ORAL_TABLET | Freq: Three times a day (TID) | ORAL | Status: DC | PRN

## 2012-05-07 NOTE — Patient Instructions (Signed)
[name],  Thank you for coming in today.  Your DM is well controlled. F/u in 3 months for this.  For your rash: nizoral cream twice daily.  For your weight gain, I have scheduled for you to see me next week July 18 at 10 AM in nutrition clinic there will be no co-pay for this day.   See you soon.  Dr. [name] Blackwater

## 2012-05-07 NOTE — Assessment & Plan Note (Signed)
A: improved. A1c down trending. Trouble with weight loss. P: Meds: compliant continue with current. Referral to nutrition clinic with me and Iver Nestle to address diet and weight loss strategies.

## 2012-05-07 NOTE — Assessment & Plan Note (Signed)
nizoral cream 

## 2012-05-07 NOTE — Assessment & Plan Note (Signed)
Bilateral ear irrigation

## 2012-05-07 NOTE — Progress Notes (Signed)
Subjective:     Patient ID: Leslie [name], male   DOB: 12-20-1945, 66 y.o.   MRN: [id number]  HPI 66 yo M presents to discuss the following:  1. Chronic pain in low back: controlled with Vicodin. Not taking more than prescribed. Swimming MWF. Not able to lose weight. No falls. No pain down legs today. No fecal or urinary incontinence. No groin numbness 2. DM: taking meds. Eating 3x per day. No CBG monitoring at home. No symptomatic low CBG.  3. Wax in both ears: x few weeks, tried home rinses without success. No pain. No fever, no drainage. Decreased hearing.  4. Rash on L forearm: pruritic. Worse after swim. Improved with bacitracin ointment.   Review of Systems As per HPI   Objective:   Physical Exam BP 134/77  Pulse 70  Temp 97.9 F (36.6 C) (Oral)  Ht 5\' 6"  (1.676 m)  Wt 227 lb 12.8 oz (103.329 kg)  BMI 36.77 kg/m2 General appearance: alert, cooperative and no distress Ears: abnormal external canal right ear - cerumen removed by irrigation and abnormal external canal left ear - cerumen removed by irrigation Throat: lips, mucosa, and tongue normal; edentulous and  gums normal Lungs: clear to auscultation bilaterally Heart: regular rate and rhythm, S1, S2 normal, no murmur, click, rub or gallop Extremities: extremities normal, atraumatic, no cyanosis or edema Pulses: 2+ and symmetric Neurologic: Grossly normal Skin: papule erythematous rash on L forearm.  Back Exam: Inspection: normal  Motion: full  SLR seated:     -                     SLR lying: - XSLR seated:         -               XSLR lying:- Seated HS Flexibility: full  Palpable tenderness: none  FABER: neg  Sensory change: neg  Reflex change: 1+ patellar   Strength at foot Plantar-flexion: 5 / 5    Dorsi-flexion: 5/ 5    Eversion:  5/ 5   Inversion: 5 / 5 Leg strength Quad: 5 / 5   Hamstring:  5/ 5   Hip flexor: 5 / 5   Hip abductors: 5 / 5 Gait Walking: normal     Assessment and Plan:

## 2012-05-07 NOTE — Assessment & Plan Note (Signed)
A: source of chronic low back pain. Better today than usual. Under pain contract. Not taking more Vicodin than prescribed and working out.  P: Refill vicodin x 3 months. Goal of weight loss.

## 2012-05-07 NOTE — Telephone Encounter (Signed)
Pt calling for refill of pain meds. I looked back to see the last time that he came into the office and it was in January so I offered an appt for him to be seen today with his pcp @ 145 pm pt agreed to this.Maryruth Eve, Lahoma Crocker

## 2012-05-10 ENCOUNTER — Other Ambulatory Visit: Payer: Self-pay | Admitting: Family Medicine

## 2012-05-10 DIAGNOSIS — M545 Low back pain: Secondary | ICD-10-CM

## 2012-05-10 MED ORDER — HYDROCODONE-ACETAMINOPHEN 10-500 MG PO TABS: 1.0000 | ORAL_TABLET | Freq: Three times a day (TID) | ORAL | Status: DC | PRN

## 2012-05-10 NOTE — Telephone Encounter (Signed)
Corrected # of tabs and dose on percocet refills. Placed up front for pick up. Patient called.

## 2012-05-13 ENCOUNTER — Encounter: Payer: Self-pay | Admitting: Family Medicine

## 2012-05-13 ENCOUNTER — Ambulatory Visit: Payer: Medicare HMO | Admitting: Family Medicine

## 2012-05-13 NOTE — Patient Instructions (Addendum)
Leslie [name],  Thank you for coming in to see me today.   Recommendations:  1.  Add at least 1 serving of vegetables to every dinner and supper time (preferably fresh or frozen).  2.  Limit high fat foods by decreasing eating out for breakfast to every other day: Home breakfast ideas hard-boiled egg and english muffin, bowel of cereal and bowel of fruit.  3. Go back to swimming 5 days per week.   F/u in 1 month.    Dr. [name] Blackwater

## 2012-05-13 NOTE — Progress Notes (Signed)
Patient ID: CORDARO RENSBERGER, male   DOB: 1946/06/18, 66 y.o.   MRN: [id number] Medical History:  1. DM2 since 2001. Never had to take insulin.  2. HTN: well controlled on multiple medications.  3. Obesity: weight increasing despite regular exercise and good chronic disease control.   Diet History:  Swim/pedal around in the pool 3x per week.  Eating Pattern: Meals:  3 meals per day  Snacks: Usually none, but does drink 48 oz of beer 5 days per week.  Nighttime Snacks: none   24 hr Recall:  Breakfast (9 AM): Pepsi calorie free (small senior cup). Sausage and egg biscuit.  Lunch/Dinner ( 12: 30 PM): Spaghetti 1.5 cups, mixed in tomato sauce, hamburger. 1 piece of garlic of bread (1/2 of hotdog bun with butter and garlic), water.  Dinner/Supper (7 PM): Raisin bran 2 cups, 2% milk 1 cup.  Snacks: none    $16/ month on food stamps. Other source of income is section [employer].  Everyday foods/drinks:  5 days a week 48 oz of beer, natural light = 440 calories. One drink of whiskey per week Jeanella Cara).  Eat out pattern: eat out for breakfast most days of the week.  Fruits: fruit cocktail, grapes, eat vegetable everyday of some kind (mixed greens, green peas, cabbage, corn)-canned. Pinto beans.  [employer]: fresh and frozen vegetables sometimes  Recommendations:  1.  Add at least 1 serving of vegetables to every dinner and supper time (preferably fresh or frozen).  2.  Limit high fat foods by decreasing eating out for breakfast to every other day: Home breakfast ideas hard-boiled egg and english muffin, bowel of cereal and bowel of fruit.  3. Go back to swimming 5 days per week.

## 2012-06-30 ENCOUNTER — Telehealth: Payer: Self-pay | Admitting: Family Medicine

## 2012-06-30 ENCOUNTER — Encounter: Payer: Self-pay | Admitting: Family Medicine

## 2012-06-30 ENCOUNTER — Ambulatory Visit (INDEPENDENT_AMBULATORY_CARE_PROVIDER_SITE_OTHER): Payer: Medicare HMO | Admitting: Family Medicine

## 2012-06-30 ENCOUNTER — Emergency Department (HOSPITAL_COMMUNITY)
Admission: EM | Admit: 2012-06-30 | Discharge: 2012-06-30 | Disposition: A | Discharge status: Home / Self Care | Payer: Medicare HMO | Attending: Emergency Medicine | Admitting: Emergency Medicine

## 2012-06-30 ENCOUNTER — Inpatient Hospital Stay (HOSPITAL_COMMUNITY)
Admission: AD | Admit: 2012-06-30 | Discharge: 2012-07-04 | DRG: 603 | Disposition: A | Discharge status: Home Health | Payer: Medicare HMO | Source: Ambulatory Visit | Attending: Family Medicine | Admitting: Family Medicine

## 2012-06-30 VITALS — BP 136/60 | HR 83 | Temp 99.7°F | Ht 66.0 in | Wt 221.9 lb

## 2012-06-30 VITALS — BP 136/60 | HR 83 | Temp 99.7°F | Ht 66.0 in | Wt 222.0 lb

## 2012-06-30 DIAGNOSIS — L03119 Cellulitis of unspecified part of limb: Principal | ICD-10-CM | POA: Diagnosis present

## 2012-06-30 DIAGNOSIS — F17200 Nicotine dependence, unspecified, uncomplicated: Secondary | ICD-10-CM | POA: Diagnosis present

## 2012-06-30 DIAGNOSIS — L03115 Cellulitis of right lower limb: Secondary | ICD-10-CM

## 2012-06-30 DIAGNOSIS — Z0389 Encounter for observation for other suspected diseases and conditions ruled out: Secondary | ICD-10-CM | POA: Insufficient documentation

## 2012-06-30 DIAGNOSIS — I10 Essential (primary) hypertension: Secondary | ICD-10-CM | POA: Diagnosis present

## 2012-06-30 DIAGNOSIS — Z6835 Body mass index (BMI) 35.0-35.9, adult: Secondary | ICD-10-CM

## 2012-06-30 DIAGNOSIS — Z792 Long term (current) use of antibiotics: Secondary | ICD-10-CM

## 2012-06-30 DIAGNOSIS — L0291 Cutaneous abscess, unspecified: Secondary | ICD-10-CM

## 2012-06-30 DIAGNOSIS — J4489 Other specified chronic obstructive pulmonary disease: Secondary | ICD-10-CM | POA: Diagnosis present

## 2012-06-30 DIAGNOSIS — F1020 Alcohol dependence, uncomplicated: Secondary | ICD-10-CM | POA: Diagnosis present

## 2012-06-30 DIAGNOSIS — E118 Type 2 diabetes mellitus with unspecified complications: Secondary | ICD-10-CM | POA: Diagnosis present

## 2012-06-30 DIAGNOSIS — E871 Hypo-osmolality and hyponatremia: Secondary | ICD-10-CM | POA: Diagnosis present

## 2012-06-30 DIAGNOSIS — K709 Alcoholic liver disease, unspecified: Secondary | ICD-10-CM | POA: Diagnosis present

## 2012-06-30 DIAGNOSIS — J449 Chronic obstructive pulmonary disease, unspecified: Secondary | ICD-10-CM | POA: Diagnosis present

## 2012-06-30 DIAGNOSIS — L02419 Cutaneous abscess of limb, unspecified: Secondary | ICD-10-CM

## 2012-06-30 DIAGNOSIS — E785 Hyperlipidemia, unspecified: Secondary | ICD-10-CM

## 2012-06-30 DIAGNOSIS — E119 Type 2 diabetes mellitus without complications: Secondary | ICD-10-CM | POA: Diagnosis present

## 2012-06-30 DIAGNOSIS — L988 Other specified disorders of the skin and subcutaneous tissue: Secondary | ICD-10-CM | POA: Diagnosis present

## 2012-06-30 DIAGNOSIS — Z79899 Other long term (current) drug therapy: Secondary | ICD-10-CM

## 2012-06-30 DIAGNOSIS — L02619 Cutaneous abscess of unspecified foot: Secondary | ICD-10-CM

## 2012-06-30 DIAGNOSIS — E669 Obesity, unspecified: Secondary | ICD-10-CM | POA: Diagnosis present

## 2012-06-30 DIAGNOSIS — E878 Other disorders of electrolyte and fluid balance, not elsewhere classified: Secondary | ICD-10-CM | POA: Diagnosis present

## 2012-06-30 DIAGNOSIS — L989 Disorder of the skin and subcutaneous tissue, unspecified: Secondary | ICD-10-CM | POA: Diagnosis present

## 2012-06-30 HISTORY — DX: Inflammatory liver disease, unspecified: K75.9

## 2012-06-30 HISTORY — DX: Low back pain: M54.5

## 2012-06-30 HISTORY — DX: Cellulitis of right lower limb: L03.115

## 2012-06-30 HISTORY — DX: Other chronic pain: G89.29

## 2012-06-30 HISTORY — DX: Polyneuropathy, unspecified: G62.9

## 2012-06-30 HISTORY — DX: Type 2 diabetes mellitus without complications: E11.9

## 2012-06-30 HISTORY — DX: Neoplasm of unspecified behavior of bone, soft tissue, and skin: D49.2

## 2012-06-30 HISTORY — DX: Gout, unspecified: M10.9

## 2012-06-30 LAB — COMPREHENSIVE METABOLIC PANEL
BUN: 15 mg/dL (ref 6–23)
CO2: 25 mEq/L (ref 19–32)
Calcium: 8.8 mg/dL (ref 8.4–10.5)
Creatinine, Ser: 0.99 mg/dL (ref 0.50–1.35)
GFR calc Af Amer: >90 mL/min (ref >90)
GFR calc non Af Amer: 83 mL/min — ABNORMAL LOW (ref >90)
Glucose, Bld: 80 mg/dL (ref 70–99)
Sodium: 127 mEq/L — ABNORMAL LOW (ref 135–145)
Total Protein: 7.4 g/dL (ref 6.0–8.3)

## 2012-06-30 LAB — DIFFERENTIAL
Basophils Absolute: 0 10*3/uL (ref 0.0–0.1)
Eosinophils Relative: 0 % (ref 0–5)
Lymphocytes Relative: 7 % — ABNORMAL LOW (ref 12–46)
Lymphs Abs: 1 10*3/uL (ref 0.7–4.0)
Neutro Abs: 11.4 10*3/uL — ABNORMAL HIGH (ref 1.7–7.7)
Neutrophils Relative %: 82 % — ABNORMAL HIGH (ref 43–77)

## 2012-06-30 LAB — CBC
Hemoglobin: 12.1 g/dL — ABNORMAL LOW (ref 13.0–17.0)
MCH: 32.6 pg (ref 26.0–34.0)
MCHC: 36.8 g/dL — ABNORMAL HIGH (ref 30.0–36.0)
MCV: 88.7 fL (ref 78.0–100.0)
RBC: 3.71 MIL/uL — ABNORMAL LOW (ref 4.22–5.81)

## 2012-06-30 LAB — GLUCOSE, CAPILLARY

## 2012-06-30 MED ORDER — ASPIRIN EC 81 MG PO TBEC
81.0000 mg | DELAYED_RELEASE_TABLET | Freq: Every day | ORAL | Status: DC
  Administered 2012-07-01 – 2012-07-04 (×4): 81 mg via ORAL
  Filled 2012-06-30 (×4): qty 1

## 2012-06-30 MED ORDER — FOLIC ACID 1 MG PO TABS
1.0000 mg | ORAL_TABLET | Freq: Every day | ORAL | Status: DC
  Administered 2012-06-30 – 2012-07-04 (×5): 1 mg via ORAL
  Filled 2012-06-30 (×5): qty 1

## 2012-06-30 MED ORDER — METOPROLOL TARTRATE 100 MG PO TABS
200.0000 mg | ORAL_TABLET | Freq: Two times a day (BID) | ORAL | Status: DC
  Administered 2012-07-01 – 2012-07-04 (×8): 200 mg via ORAL
  Filled 2012-06-30 (×9): qty 2

## 2012-06-30 MED ORDER — PIPERACILLIN-TAZOBACTAM 3.375 G IVPB
3.3750 g | Freq: Three times a day (TID) | INTRAVENOUS | Status: DC
  Administered 2012-07-01 – 2012-07-02 (×4): 3.375 g via INTRAVENOUS
  Filled 2012-06-30 (×6): qty 50

## 2012-06-30 MED ORDER — INSULIN ASPART 100 UNIT/ML ~~LOC~~ SOLN
0.0000 [IU] | Freq: Three times a day (TID) | SUBCUTANEOUS | Status: DC
  Administered 2012-06-30: 2 [IU] via SUBCUTANEOUS
  Administered 2012-07-01: 3 [IU] via SUBCUTANEOUS
  Administered 2012-07-02 (×2): 2 [IU] via SUBCUTANEOUS
  Administered 2012-07-02 – 2012-07-03 (×3): 3 [IU] via SUBCUTANEOUS
  Administered 2012-07-03: 2 [IU] via SUBCUTANEOUS
  Administered 2012-07-04 (×2): 3 [IU] via SUBCUTANEOUS

## 2012-06-30 MED ORDER — HEPARIN SODIUM (PORCINE) 5000 UNIT/ML IJ SOLN
5000.0000 [IU] | Freq: Three times a day (TID) | INTRAMUSCULAR | Status: DC
  Administered 2012-06-30 – 2012-07-04 (×12): 5000 [IU] via SUBCUTANEOUS
  Filled 2012-06-30 (×15): qty 1

## 2012-06-30 MED ORDER — VANCOMYCIN HCL 1000 MG IV SOLR
1750.0000 mg | Freq: Once | INTRAVENOUS | Status: AC
  Administered 2012-06-30: 1750 mg via INTRAVENOUS
  Filled 2012-06-30: qty 1750

## 2012-06-30 MED ORDER — MORPHINE SULFATE 2 MG/ML IJ SOLN: 2.0000 mg | INTRAMUSCULAR | Status: DC | PRN

## 2012-06-30 MED ORDER — LORAZEPAM 2 MG/ML IJ SOLN: 1.0000 mg | Freq: Four times a day (QID) | INTRAMUSCULAR | Status: DC | PRN

## 2012-06-30 MED ORDER — FUROSEMIDE 20 MG PO TABS
20.0000 mg | ORAL_TABLET | Freq: Every day | ORAL | Status: DC
  Administered 2012-07-01 – 2012-07-03 (×3): 20 mg via ORAL
  Filled 2012-06-30 (×3): qty 1

## 2012-06-30 MED ORDER — LORAZEPAM 1 MG PO TABS: 1.0000 mg | ORAL_TABLET | Freq: Four times a day (QID) | ORAL | Status: DC | PRN

## 2012-06-30 MED ORDER — ACETAMINOPHEN 325 MG PO TABS
650.0000 mg | ORAL_TABLET | Freq: Four times a day (QID) | ORAL | Status: DC | PRN
  Administered 2012-06-30 – 2012-07-02 (×3): 650 mg via ORAL
  Filled 2012-06-30 (×3): qty 2

## 2012-06-30 MED ORDER — VITAMIN B-1 100 MG PO TABS
100.0000 mg | ORAL_TABLET | Freq: Every day | ORAL | Status: DC
  Administered 2012-06-30 – 2012-07-04 (×5): 100 mg via ORAL
  Filled 2012-06-30 (×5): qty 1

## 2012-06-30 MED ORDER — ONDANSETRON HCL 4 MG PO TABS: 4.0000 mg | ORAL_TABLET | Freq: Four times a day (QID) | ORAL | Status: DC | PRN

## 2012-06-30 MED ORDER — FLUTICASONE-SALMETEROL 250-50 MCG/DOSE IN AEPB
1.0000 | INHALATION_SPRAY | Freq: Two times a day (BID) | RESPIRATORY_TRACT | Status: DC
  Administered 2012-06-30 – 2012-07-03 (×7): 1 via RESPIRATORY_TRACT
  Filled 2012-06-30: qty 14

## 2012-06-30 MED ORDER — HYDROCODONE-ACETAMINOPHEN 5-325 MG PO TABS
1.0000 | ORAL_TABLET | ORAL | Status: DC | PRN
  Administered 2012-07-01 – 2012-07-02 (×4): 1 via ORAL
  Administered 2012-07-03 – 2012-07-04 (×2): 2 via ORAL
  Filled 2012-06-30 (×3): qty 1
  Filled 2012-06-30 (×2): qty 2
  Filled 2012-06-30: qty 1

## 2012-06-30 MED ORDER — ALBUTEROL SULFATE HFA 108 (90 BASE) MCG/ACT IN AERS: 2.0000 | INHALATION_SPRAY | RESPIRATORY_TRACT | Status: DC | PRN

## 2012-06-30 MED ORDER — ADULT MULTIVITAMIN W/MINERALS CH
1.0000 | ORAL_TABLET | Freq: Every day | ORAL | Status: DC
  Administered 2012-06-30 – 2012-07-04 (×5): 1 via ORAL
  Filled 2012-06-30 (×5): qty 1

## 2012-06-30 MED ORDER — ALLOPURINOL 300 MG PO TABS
300.0000 mg | ORAL_TABLET | Freq: Every day | ORAL | Status: DC
  Administered 2012-07-01 – 2012-07-04 (×4): 300 mg via ORAL
  Filled 2012-06-30 (×4): qty 1

## 2012-06-30 MED ORDER — PIPERACILLIN-TAZOBACTAM 3.375 G IVPB
3.3750 g | Freq: Once | INTRAVENOUS | Status: AC
  Administered 2012-06-30: 3.375 g via INTRAVENOUS
  Filled 2012-06-30: qty 50

## 2012-06-30 MED ORDER — ONDANSETRON HCL 4 MG/2ML IJ SOLN: 4.0000 mg | Freq: Four times a day (QID) | INTRAMUSCULAR | Status: DC | PRN

## 2012-06-30 MED ORDER — SIMVASTATIN 40 MG PO TABS
40.0000 mg | ORAL_TABLET | Freq: Every day | ORAL | Status: DC
  Filled 2012-06-30: qty 1

## 2012-06-30 MED ORDER — SODIUM CHLORIDE 0.9 % IV SOLN
INTRAVENOUS | Status: DC
  Administered 2012-06-30 – 2012-07-02 (×2): 20 mL/h via INTRAVENOUS
  Administered 2012-07-03: 20 mL via INTRAVENOUS

## 2012-06-30 MED ORDER — VANCOMYCIN HCL IN DEXTROSE 1-5 GM/200ML-% IV SOLN
1000.0000 mg | Freq: Two times a day (BID) | INTRAVENOUS | Status: DC
  Administered 2012-07-01 – 2012-07-02 (×3): 1000 mg via INTRAVENOUS
  Filled 2012-06-30 (×4): qty 200

## 2012-06-30 MED ORDER — PANTOPRAZOLE SODIUM 40 MG PO TBEC
40.0000 mg | DELAYED_RELEASE_TABLET | Freq: Every day | ORAL | Status: DC
  Administered 2012-07-01 – 2012-07-03 (×3): 40 mg via ORAL
  Filled 2012-06-30 (×2): qty 1

## 2012-06-30 MED ORDER — LOSARTAN POTASSIUM 50 MG PO TABS
50.0000 mg | ORAL_TABLET | Freq: Every day | ORAL | Status: DC
  Administered 2012-07-01 – 2012-07-04 (×4): 50 mg via ORAL
  Filled 2012-06-30 (×4): qty 1

## 2012-06-30 MED ORDER — THIAMINE HCL 100 MG/ML IJ SOLN
100.0000 mg | Freq: Every day | INTRAMUSCULAR | Status: DC
  Filled 2012-06-30 (×4): qty 1

## 2012-06-30 MED ORDER — AMLODIPINE BESYLATE 10 MG PO TABS
10.0000 mg | ORAL_TABLET | Freq: Every day | ORAL | Status: DC
  Administered 2012-07-01 – 2012-07-04 (×4): 10 mg via ORAL
  Filled 2012-06-30 (×4): qty 1

## 2012-06-30 NOTE — Progress Notes (Signed)
ANTIBIOTIC CONSULT NOTE - INITIAL  Pharmacy Consult for Vancomycin + Zosyn Indication: RLE cellulitis  No Known Allergies  Patient Measurements: Height: 5\' 6"  (167.6 cm) Weight: 222 lb (100.699 kg) IBW/kg (Calculated) : 63.8   Vital Signs: Temp: 101.4 F (38.6 C) (09/04 1640) Temp src: Oral (09/04 1608) BP: 152/64 mmHg (09/04 1640) Pulse Rate: 85  (09/04 1640) Intake/Output from previous day:   Intake/Output from this shift:    Labs: No results found for this basename: WBC:3,HGB:3,PLT:3,LABCREA:3,CREATININE:3 in the last 72 hours Estimated Creatinine Clearance: 80 ml/min (by C-G formula based on Cr of 1.01). No results found for this basename: VANCOTROUGH:2,VANCOPEAK:2,VANCORANDOM:2,GENTTROUGH:2,GENTPEAK:2,GENTRANDOM:2,TOBRATROUGH:2,TOBRAPEAK:2,TOBRARND:2,AMIKACINPEAK:2,AMIKACINTROU:2,AMIKACIN:2, in the last 72 hours   Microbiology: No results found for this or any previous visit (from the past 720 hour(s)).  Medical History: Past Medical History  Diagnosis Date  . Gout   . Asthma   . COPD (chronic obstructive pulmonary disease)   . Diabetes mellitus     Diagnosed in 2002  . GERD (gastroesophageal reflux disease)   . Hyperlipidemia   . Hypertension   . Substance abuse     Assessment: 66 y.o. M admitted due to RLE swelling/cellultis to start IV antibiotics. Pharmacy was consulted to dose Vancomycin + Zosyn for empiric coverage given the patient's history of diabetes. Wt 100.7 kg, SCr 0.99, CrCl~70-80 ml/min.   Goal of Therapy:  Vancomycin trough level 10-15 mcg/ml  Plan:  1. Vancomycin 1750 mg IV x 1 dose to load followed by 1g IV every 12 hours 2. Zosyn 3.375g IV every 8 hours 3. Will continue to follow renal function, culture results, LOT, and antibiotic de-escalation plans   Alycia Rossetti, PharmD, BCPS Clinical Pharmacist Pager: [phone] 06/30/2012 6:34 PM

## 2012-06-30 NOTE — H&P (Signed)
Leslie [name] is an 66 y.o. male.   Chief Complaint: Foot swelling  HPI: 66 yo male with history of diabetes, HTN, COPD and EtOH abuse here with complaint of foot swelling, pain and redness.  States that noticed foot was becoming more swollen and painful on Sunday 06/27/12 and has progressively gotten worse since that time.  He now has pain and redness all the way up to his knee.  He states that this is the third time this has occurred.  He does report having shaking chills on Monday, however he did not check his temperature.  He does have decreased appetite.  He denies any wound on the foot or leg, nausea, chest pain, headache, swelling of other extremities.    Past Medical History  Diagnosis Date  . Gout   . Asthma   . COPD (chronic obstructive pulmonary disease)   . Diabetes mellitus     Diagnosed in 2002  . GERD (gastroesophageal reflux disease)   . Hyperlipidemia   . Hypertension   . Substance abuse     No past surgical history on file.  No family history on file. Social History:  reports that he quit smoking about 10 years ago. His smoking use included Cigarettes. He has never used smokeless tobacco. He reports that he drinks about 14 ounces of alcohol per week. His drug history not on file.  Allergies: No Known Allergies   (Not in a hospital admission)  No results found for this or any previous visit (from the past 48 hour(s)). No results found.  Review of Systems  Constitutional: Positive for chills. Negative for malaise/fatigue.  Respiratory: Negative for shortness of breath.   Cardiovascular: Negative for chest pain.  Gastrointestinal: Negative for nausea.  Neurological: Negative for headaches.    Blood pressure 136/60, pulse 83, temperature 99.7 F (37.6 C), temperature source Oral, height 5\' 6"  (1.676 m), weight 222 lb (100.699 kg). Physical Exam  Constitutional: No distress.       Obese male, nad   HENT:  Head: Normocephalic and atraumatic.       Skin  growth noted on nose.    Eyes: EOM are normal. No scleral icterus.  Neck: Neck supple.  Cardiovascular: Normal rate and regular rhythm.   Respiratory: Effort normal and breath sounds normal. No respiratory distress. He has no wheezes.  GI: Bowel sounds are normal. He exhibits no distension. There is no tenderness.  Musculoskeletal:       RLE with 2+ edema.  There is erythema along the lower leg that extends to just below the knee anteriorly and halfway up the calf posteriorly.  Interdigital spaces are macerated but no obvious sites of infection.  Pulses are 1+ bilaterally.   Neurological: He is alert.     Assessment/Plan 66 yo male with history of DM, HTN, COPD and EtOH abuse here with complaint of foot pain and swelling.   1.  Cellulitis:  Cellulitis of the RLE.  Given his history of diabetes and the extent of the cellulitis, I believe that he needs to be admitted for IV antibiotics.  He has good pulses to the extremity.  No obvious site of infection.  Will start vancomycin and zosyn.  Blood cultures ordered given report of shaking chills.  Area marked and dated prior to sending to hospital, recheck tonight.  Hydrocodone and morphine as needed for pain control.  2. DM:  Will place on SSI while in hospital.  Hold metformin while in hospital in case he were  to need any imaging requiring contrast.  3.  HTN:  Continue home medications, adjust as needed.   4. FEN/GI:  NS @ KVO.  Carb mod medium diet  5.  PPX:  Heparin  6.  Dispo:  Pending improvement.   Leslie [name] 06/30/2012, 5:14 PM

## 2012-06-30 NOTE — Progress Notes (Signed)
Patient ID: Leslie [name], male   DOB: 08-15-1946, 66 y.o.   MRN: [id number] [name] is an 66 y.o. male.   Chief Complaint: Foot swelling  HPI: 66 yo male with history of diabetes, HTN, COPD and EtOH abuse here with complaint of foot swelling, pain and redness.  States that noticed foot was becoming more swollen and painful on Sunday 06/27/12 and has progressively gotten worse since that time.  He now has pain and redness all the way up to his knee.  He states that this is the third time this has occurred.  He does report having shaking chills on Monday, however he did not check his temperature.  He does have decreased appetite.  He denies any wound on the foot or leg, nausea, chest pain, headache, swelling of other extremities.    Past Medical History Diagnosis Date . Gout  . Asthma  . COPD (chronic obstructive pulmonary disease)  . Diabetes mellitus    Diagnosed in 2002 . GERD (gastroesophageal reflux disease)  . Hyperlipidemia  . Hypertension  . Substance abuse    No past surgical history on file.  No family history on file. Social History:  reports that he quit smoking about 10 years ago. His smoking use included Cigarettes. He has never used smokeless tobacco. He reports that he drinks about 14 ounces of alcohol per week. His drug history not on file.  Allergies: No Known Allergies   (Not in a hospital admission)  No results found for this or any previous visit (from the past 48 hour(s)). No results found.  Review of Systems  Constitutional: Positive for chills. Negative for malaise/fatigue.  Respiratory: Negative for shortness of breath.   Cardiovascular: Negative for chest pain.  Gastrointestinal: Negative for nausea.  Neurological: Negative for headaches.   Subjective  Blood pressure 136/60, pulse 83, temperature 99.7 F (37.6 C), temperature source Oral, height 5\' 6"  (1.676 m), weight 222 lb (100.699 kg). Physical Exam  Constitutional: No distress.   Obese male, nad   HENT:  Head: Normocephalic and atraumatic.       Skin growth noted on nose.    Eyes: EOM are normal. No scleral icterus.  Neck: Neck supple.  Cardiovascular: Normal rate and regular rhythm.   Respiratory: Effort normal and breath sounds normal. No respiratory distress. He has no wheezes.  GI: Bowel sounds are normal. He exhibits no distension. There is no tenderness.  Musculoskeletal:       RLE with 2+ edema.  There is erythema along the lower leg that extends to just below the knee anteriorly and halfway up the calf posteriorly.  Interdigital spaces are macerated but no obvious sites of infection.  Pulses are 1+ bilaterally.   Neurological: He is alert.

## 2012-06-30 NOTE — Telephone Encounter (Signed)
Patient is calling wishing to be seen but there is nothing left.  He is Diabetic and his feet are swelling to the point where he can barely walk.

## 2012-06-30 NOTE — Telephone Encounter (Signed)
Returned call to patient and scheduled appt for today on overflow clinic at 1:30pm.  Nolene Ebbs, RN '

## 2012-06-30 NOTE — Assessment & Plan Note (Signed)
Cellulitis: Cellulitis of the RLE. Given his history of diabetes and the extent of the cellulitis, I believe that he needs to be admitted for IV antibiotics. He has good pulses to the extremity. No obvious site of infection. Will start vancomycin and zosyn. Blood cultures ordered given report of shaking chills. Area marked and dated prior to sending to hospital, recheck tonight. Hydrocodone and morphine as needed for pain control.

## 2012-07-01 ENCOUNTER — Encounter (HOSPITAL_COMMUNITY): Payer: Self-pay | Admitting: General Practice

## 2012-07-01 DIAGNOSIS — G629 Polyneuropathy, unspecified: Secondary | ICD-10-CM

## 2012-07-01 DIAGNOSIS — M109 Gout, unspecified: Secondary | ICD-10-CM

## 2012-07-01 DIAGNOSIS — G8929 Other chronic pain: Secondary | ICD-10-CM

## 2012-07-01 DIAGNOSIS — M545 Low back pain, unspecified: Secondary | ICD-10-CM

## 2012-07-01 DIAGNOSIS — D492 Neoplasm of unspecified behavior of bone, soft tissue, and skin: Secondary | ICD-10-CM

## 2012-07-01 HISTORY — DX: Low back pain, unspecified: M54.50

## 2012-07-01 HISTORY — DX: Neoplasm of unspecified behavior of bone, soft tissue, and skin: D49.2

## 2012-07-01 HISTORY — DX: Polyneuropathy, unspecified: G62.9

## 2012-07-01 HISTORY — DX: Other chronic pain: G89.29

## 2012-07-01 HISTORY — DX: Gout, unspecified: M10.9

## 2012-07-01 LAB — BASIC METABOLIC PANEL
BUN: 12 mg/dL (ref 6–23)
CO2: 26 mEq/L (ref 19–32)
Calcium: 8.6 mg/dL (ref 8.4–10.5)
Creatinine, Ser: 0.92 mg/dL (ref 0.50–1.35)
GFR calc non Af Amer: 86 mL/min — ABNORMAL LOW (ref >90)
Glucose, Bld: 112 mg/dL — ABNORMAL HIGH (ref 70–99)
Sodium: 128 mEq/L — ABNORMAL LOW (ref 135–145)

## 2012-07-01 LAB — GLUCOSE, CAPILLARY
Glucose-Capillary: 155 mg/dL — ABNORMAL HIGH (ref 70–99)
Glucose-Capillary: 91 mg/dL (ref 70–99)

## 2012-07-01 MED ORDER — ATORVASTATIN CALCIUM 20 MG PO TABS
20.0000 mg | ORAL_TABLET | Freq: Every day | ORAL | Status: DC
  Administered 2012-07-01 – 2012-07-03 (×3): 20 mg via ORAL
  Filled 2012-07-01 (×4): qty 1

## 2012-07-01 NOTE — Progress Notes (Signed)
Patient ID: Leslie [name], male   DOB: May 12, 1946, 66 y.o.   MRN: [id number] Opened in error

## 2012-07-01 NOTE — Progress Notes (Signed)
I discussed with  Dr Bradshaw.  I agree with their plans documented in their progress note for today.  

## 2012-07-01 NOTE — Progress Notes (Deleted)
And it will not let me close it.

## 2012-07-01 NOTE — Progress Notes (Deleted)
I never saw this patient. How could I have an open chart for him? It even states office appt with me at the top of screen. Einar Pheasant saw him not me.  I am not sure what this means? Is he my patient, [facility] or [facility]? Brysin Towery

## 2012-07-01 NOTE — Progress Notes (Signed)
Utilization review completed. Jon Lall, RN, BSN. 

## 2012-07-01 NOTE — H&P (Signed)
FMTS Attending Admission Note: Leslie Sabal MD Personal pager:  [phone] FPTS Service Pager:  [phone]  I have reviewed this patient and his chart and have discussed with attending who examined patient and admitted to hospital service.  I agree with their findings, assessment, and plan.

## 2012-07-01 NOTE — Progress Notes (Signed)
Family Medicine Teaching Service Daily Progress Note Service Page: [phone]  Subjective:  No acute events overnight. His foot is hurting less today and he can move  It further than he could yesterday. He can wiggle his toes today. States it feels hot and tight but has gotten better. He tells me this is the third time he has had cellulitis on his RLE. Denies Fever, chills, sweats, and nausea. Good appetite and slept well.   Objective: Temp:  [98.2 F (36.8 C)-101.4 F (38.6 C)] 98.2 F (36.8 C) (09/05 0634) Pulse Rate:  [74-85] 74  (09/05 0634) Resp:  [16-18] 18  (09/05 0634) BP: (126-168)/(60-79) 149/67 mmHg (09/05 0634) SpO2:  [95 %-100 %] 100 % (09/05 0634) Weight:  [221 lb 14.4 oz (100.653 kg)-222 lb (100.699 kg)] 222 lb (100.699 kg) (09/04 1640) Exam: Constitutional: No distress. Obese male, nad HEENT: NCAT, Skin growth noted on nose., EOMI No scleral icterus.  Neck: Neck supple.  Cardiovascular: Normal rate and regular rhythm.  Respiratory: CTAB, non labored,   GI: + BS. NT/ND  Musculoskeletal: RLE with 2+ edema. Erythema along the lower leg that extends to just below the knee anteriorly and halfway up the calf posteriorly, Retracting from the line drawn in clinic. Pulses 1 + BL.  Neurological: A&O x4, no gross deficits  I have reviewed the patient's medications, labs, imaging, and diagnostic testing.  Notable results are summarized below.  CBC BMET   Lab 06/30/12 1727  WBC 13.9*  HGB 12.1*  HCT 32.9*  PLT 103*    Lab 07/01/12 0710 06/30/12 1727  NA 128* 127*  K 3.5 3.4*  CL 92* 89*  CO2 26 25  BUN 12 15  CREATININE 0.92 0.99  GLUCOSE 112* 80  CALCIUM 8.6 8.8        . allopurinol  300 mg Oral Daily  . amLODipine  10 mg Oral Daily  . aspirin EC  81 mg Oral Daily  . Fluticasone-Salmeterol  1 puff Inhalation BID  . folic acid  1 mg Oral Daily  . furosemide  20 mg Oral Daily  . heparin  5,000 Units Subcutaneous Q8H  . insulin aspart  0-15 Units Subcutaneous  TID WC  . losartan  50 mg Oral Daily  . metoprolol  200 mg Oral BID  . multivitamin with minerals  1 tablet Oral Daily  . pantoprazole  40 mg Oral Q1200  . piperacillin-tazobactam (ZOSYN)  IV  3.375 g Intravenous Once  . piperacillin-tazobactam (ZOSYN)  IV  3.375 g Intravenous Q8H  . simvastatin  40 mg Oral q1800  . thiamine  100 mg Oral Daily   Or  . thiamine  100 mg Intravenous Daily  . vancomycin  1,750 mg Intravenous Once  . vancomycin  1,000 mg Intravenous Q12H   PRN meds include:  acetaminophen, albuterol, HYDROcodone-acetaminophen, LORazepam, LORazepam, morphine injection, ondansetron (ZOFRAN) IV, ondansetron   Plan: 66 yo male with history of DM, HTN, COPD and EtOH abuse here RLE extremity cellulitis.   1. Cellulitis: Cellulitis of the RLE now improving  - febrile X 1 to 101.4, PRN tylenol for fever - Blood cultures pending.  - Continue IV vanc and zosyn, plan to de-escalate to PO clinda when possible.  - Area marked and dated prior to sending to hospital, has not extended past lines.   - Hydrocodone and morphine PRN for pain control.   2. DM:  - SSI - Hold metformin while in hospital in case he were to need any imaging requiring  contrast.   3. EtOH abuse - CIWA protocol with ativan  4. HTN: 126-68/68-79, mostly [id number] systolic often,  - watch for now, will consider increasing meds if it continues to be high.   5. FEN/GI: NS @ KVO. Carb mod medium diet   6. PPX: Heparin   7. Dispo: Pending improvement.   Kenn File, MD 07/01/2012, 9:51 AM

## 2012-07-02 DIAGNOSIS — M7989 Other specified soft tissue disorders: Secondary | ICD-10-CM

## 2012-07-02 DIAGNOSIS — M79609 Pain in unspecified limb: Secondary | ICD-10-CM

## 2012-07-02 LAB — GLUCOSE, CAPILLARY
Glucose-Capillary: 157 mg/dL — ABNORMAL HIGH (ref 70–99)
Glucose-Capillary: 354 mg/dL — ABNORMAL HIGH (ref 70–99)

## 2012-07-02 LAB — BASIC METABOLIC PANEL
Calcium: 8.2 mg/dL — ABNORMAL LOW (ref 8.4–10.5)
Chloride: 89 mEq/L — ABNORMAL LOW (ref 96–112)
Creatinine, Ser: 0.85 mg/dL (ref 0.50–1.35)
GFR calc Af Amer: >90 mL/min (ref >90)
Sodium: 126 mEq/L — ABNORMAL LOW (ref 135–145)

## 2012-07-02 LAB — CBC
MCV: 88.8 fL (ref 78.0–100.0)
Platelets: 111 10*3/uL — ABNORMAL LOW (ref 150–400)
RBC: 3.47 MIL/uL — ABNORMAL LOW (ref 4.22–5.81)
RDW: 13.4 % (ref 11.5–15.5)
WBC: 8.9 10*3/uL (ref 4.0–10.5)

## 2012-07-02 MED ORDER — CLINDAMYCIN HCL 300 MG PO CAPS
600.0000 mg | ORAL_CAPSULE | Freq: Three times a day (TID) | ORAL | Status: DC
  Administered 2012-07-02 – 2012-07-04 (×5): 600 mg via ORAL
  Filled 2012-07-02 (×9): qty 2

## 2012-07-02 MED ORDER — POTASSIUM CHLORIDE CRYS ER 20 MEQ PO TBCR
40.0000 meq | EXTENDED_RELEASE_TABLET | Freq: Once | ORAL | Status: AC
  Administered 2012-07-02: 40 meq via ORAL
  Filled 2012-07-02: qty 2

## 2012-07-02 MED ORDER — CLINDAMYCIN HCL 300 MG PO CAPS
300.0000 mg | ORAL_CAPSULE | Freq: Three times a day (TID) | ORAL | Status: DC
  Administered 2012-07-02: 300 mg via ORAL
  Filled 2012-07-02 (×3): qty 1

## 2012-07-02 NOTE — Progress Notes (Signed)
VASCULAR LAB PRELIMINARY  PRELIMINARY  PRELIMINARY  PRELIMINARY  Right lower extremity venous duplex completed.    Preliminary report:  Right:  No evidence of DVT, superficial thrombosis, or Baker's cyst.  Willey Due, RVt 07/02/2012, 10:22 AM

## 2012-07-02 NOTE — Progress Notes (Signed)
Family Medicine Teaching Service Daily Progress Note Service Page: [phone]  Subjective:  No acute events overnight. Pain well controlled on current pain regimen but still c/o calf pain on the R. Tightness and ROM improving. Has a very good appetite, and he slept well. Denies  Fever, chills, sweats, and nausea.  and slept well.   Objective: Temp:  [97.7 F (36.5 C)-99.6 F (37.6 C)] 98.8 F (37.1 C) (09/06 0517) Pulse Rate:  [73-74] 73  (09/06 0517) Resp:  [18] 18  (09/06 0517) BP: (131-148)/(60-65) 148/60 mmHg (09/06 0517) SpO2:  [95 %-97 %] 97 % (09/06 0517) Exam: Constitutional: No distress. Obese male, nad HEENT: NCAT, Skin growth noted on nose., EOMI No scleral icterus.  Neck: Neck supple.  Cardiovascular: Normal rate and regular rhythm.  Respiratory: CTAB, non labored,   GI: + BS. NT/ND  Musculoskeletal: RLE with 2+ edema. Erythema Continued Retracting from the line drawn in clinic. Pulses 1 + BL.  Neurological: A&O x4, no gross deficits  I have reviewed the patient's medications, labs, imaging, and diagnostic testing.  Notable results are summarized below.  CBC BMET   Lab 07/02/12 0630 06/30/12 1727  WBC 8.9 13.9*  HGB 11.2* 12.1*  HCT 30.8* 32.9*  PLT 111* 103*    Lab 07/02/12 0630 07/01/12 0710 06/30/12 1727  NA 126* 128* 127*  K 3.1* 3.5 3.4*  CL 89* 92* 89*  CO2 26 26 25   BUN 10 12 15   CREATININE 0.85 0.92 0.99  GLUCOSE 144* 112* 80  CALCIUM 8.2* 8.6 8.8        . allopurinol  300 mg Oral Daily  . amLODipine  10 mg Oral Daily  . aspirin EC  81 mg Oral Daily  . atorvastatin  20 mg Oral q1800  . Fluticasone-Salmeterol  1 puff Inhalation BID  . folic acid  1 mg Oral Daily  . furosemide  20 mg Oral Daily  . heparin  5,000 Units Subcutaneous Q8H  . insulin aspart  0-15 Units Subcutaneous TID WC  . losartan  50 mg Oral Daily  . metoprolol  200 mg Oral BID  . multivitamin with minerals  1 tablet Oral Daily  . pantoprazole  40 mg Oral Q1200  .  piperacillin-tazobactam (ZOSYN)  IV  3.375 g Intravenous Q8H  . thiamine  100 mg Oral Daily   Or  . thiamine  100 mg Intravenous Daily  . vancomycin  1,000 mg Intravenous Q12H  . DISCONTD: simvastatin  40 mg Oral q1800   PRN meds include:  acetaminophen, albuterol, HYDROcodone-acetaminophen, LORazepam, LORazepam, morphine injection, ondansetron (ZOFRAN) IV, ondansetron   Plan: 66 yo male with history of DM, HTN, COPD and EtOH abuse here RLE extremity cellulitis.   1. Cellulitis: Cellulitis of the RLE now improving  - Afebrile, WBC 8.9 from 13.9 on admission - Change to PO clinda to complete 14 days treatment.  - Area marked and dated prior to sending to hospital,    - Hydrocodone and morphine PRN for pain control.   2. Calf pain- rule out DVT with venous U/S  3. Hyponatremia-  - 127 on admission, 126 today - plan to follow up outpatient - daily BMP  4. DM: CBG 76-146 - SSI - Metformin held.   5. EtOH abuse - CIWA protocol with ativan - Scores have been no more than 1  6. HTN: 131-148/60-63,  - watch for now, will consider increasing meds if it continues to be high.   7. FEN/GI: NS @ KVO. Carb  mod medium diet   8. PPX: Heparin   9. Dispo: home today with close follow up Monday or Tuesday pending negative venous duplex, will make appointment.  Kenn File, MD 07/02/2012, 9:29 AM

## 2012-07-02 NOTE — Progress Notes (Signed)
I have seen and examined this patient. I have discussed with Dr Wendi Snipes.  I agree with their findings and plans as documented in their progress note.  Acute Issues 1. Leg Cellulitis, acute, recurrent - Patient complaining of significant pain in right leg with standing to the point that he reports that he cannot ambulate. - [place] RLE 07/02/12 (Preliminary): No evidence of DVT.  - Lab and vitals improved. - Changed to oral Clindamycin 600 mg three times a day to cover Strep and MRSA - opiate analgesia.  - disposition home when able to ambulate sufficiently to care for ADLs with short-term [facility] assistance

## 2012-07-03 LAB — GLUCOSE, CAPILLARY
Glucose-Capillary: 149 mg/dL — ABNORMAL HIGH (ref 70–99)
Glucose-Capillary: 157 mg/dL — ABNORMAL HIGH (ref 70–99)

## 2012-07-03 LAB — BASIC METABOLIC PANEL
GFR calc Af Amer: >90 mL/min (ref >90)
GFR calc non Af Amer: >90 mL/min (ref >90)
Glucose, Bld: 151 mg/dL — ABNORMAL HIGH (ref 70–99)
Potassium: 3.6 mEq/L (ref 3.5–5.1)
Sodium: 129 mEq/L — ABNORMAL LOW (ref 135–145)

## 2012-07-03 LAB — CBC
Hemoglobin: 11.7 g/dL — ABNORMAL LOW (ref 13.0–17.0)
RBC: 3.69 MIL/uL — ABNORMAL LOW (ref 4.22–5.81)

## 2012-07-03 MED ORDER — SODIUM CHLORIDE 0.9 % IV BOLUS (SEPSIS)
500.0000 mL | Freq: Once | INTRAVENOUS | Status: AC
  Administered 2012-07-03: 500 mL via INTRAVENOUS

## 2012-07-03 NOTE — Progress Notes (Signed)
Assisted with ambulation with front wheel walker about 23ft in room, no acute distress. Will continue to monitor and provide nursing care as required.

## 2012-07-03 NOTE — Progress Notes (Addendum)
Subjective: States the foot still hurts and he is unable to walk on it still.  States the swelling is not much better.  Appetite is still down.  Episode of fever yesterday about noon.    Objective: Vital signs in last 24 hours: Filed Vitals:   07/02/12 1100 07/02/12 1300 07/02/12 2113 07/03/12 0719  BP:  144/64 148/63 140/62  Pulse:  72 75 56  Temp:  100.5 F (38.1 C) 98.6 F (37 C) 97.6 F (36.4 C)  TempSrc:      Resp:  20 18 18   Height:      Weight:      SpO2: 96% 95% 96% 99%   Weight change:   Intake/Output Summary (Last 24 hours) at 07/03/12 1211 Last data filed at 07/03/12 0800  Gross per 24 hour  Intake   1138 ml  Output   1000 ml  Net    138 ml   Vitals reviewed. General: resting in bed, NAD HEENT: large skin overgrowth noted on the right side of the nose, PERRL, EOMI, no scleral icterus Cardiac: RRR, no rubs, murmurs or gallops Pulm: clear to auscultation bilaterally, no wheezes, rales, or rhonchi Abd: soft, nontender, nondistended, BS present Ext: right lower leg with 2+ pitting edema to the knee.  There is a large erythematous area over the medial malleolus with petechiae and spreading out.  No annular crusting or flaking noted.  Toe nails are hyperkeratotic with crusting between the toes.   Neuro: alert and oriented X3, cranial nerves II-XII grossly intact, strength and sensation to light touch equal in bilateral upper and lower extremities  Lab Results: Basic Metabolic Panel:  Lab [id number] 07/02/12 0630  NA 129* 126*  K 3.6 3.1*  CL 93* 89*  CO2 28 26  GLUCOSE 151* 144*  BUN 7 10  CREATININE 0.76 0.85  CALCIUM 8.8 8.2*  MG -- --  PHOS -- --   Liver Function Tests:  Lab 06/30/12 1727  AST 31  ALT 23  ALKPHOS 61  BILITOT 1.4*  PROT 7.4  ALBUMIN 3.4*   CBC:  Lab 07/03/12 0620 07/02/12 0630 06/30/12 1727  WBC 7.8 8.9 --  NEUTROABS -- -- 11.4*  HGB 11.7* 11.2* --  HCT 32.5* 30.8* --  MCV 88.1 88.8 --  PLT 140* 111* --   CBG:  Lab  07/03/12 1133 07/03/12 0644 07/02/12 2132 07/02/12 1638 07/02/12 1102 07/02/12 0700  GLUCAP 157* 149* 354* 157* 146* 144*   Micro Results: No results found for this or any previous visit (from the past 240 hour(s)). Studies/Results: No results found. Medications: I have reviewed the patient's current medications. Scheduled Meds:   . allopurinol  300 mg Oral Daily  . amLODipine  10 mg Oral Daily  . aspirin EC  81 mg Oral Daily  . atorvastatin  20 mg Oral q1800  . clindamycin  600 mg Oral Q8H  . Fluticasone-Salmeterol  1 puff Inhalation BID  . folic acid  1 mg Oral Daily  . furosemide  20 mg Oral Daily  . heparin  5,000 Units Subcutaneous Q8H  . insulin aspart  0-15 Units Subcutaneous TID WC  . losartan  50 mg Oral Daily  . metoprolol  200 mg Oral BID  . multivitamin with minerals  1 tablet Oral Daily  . pantoprazole  40 mg Oral Q1200  . thiamine  100 mg Oral Daily   Or  . thiamine  100 mg Intravenous Daily  . DISCONTD: clindamycin  300 mg Oral  Q8H  . DISCONTD: piperacillin-tazobactam (ZOSYN)  IV  3.375 g Intravenous Q8H  . DISCONTD: vancomycin  1,000 mg Intravenous Q12H   Continuous Infusions:   . sodium chloride 20 mL (07/03/12 0844)   PRN Meds:.acetaminophen, albuterol, HYDROcodone-acetaminophen, LORazepam, LORazepam, morphine injection, ondansetron (ZOFRAN) IV, ondansetron Assessment/Plan: 1. Right lower extremity Cellulitis: Cellulitis improving but very slowly.  He had another episode of fever yesterday but this has not recurred today yet.  WBCs are stable and normal at this point.  This is his third episode of cellulitis.  He states that it is still painful and he is unable to walk on it still.  His leg is also significantly swollen.  He has been using his Vicodin for the pain occasionally.  He is on PO clindamycin, today is day 3 of the currently treatment course.  He is improving but slowly.  Pictures were taken with the patients permission today and showed to Dr. 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  This can be related to his baseline alcohol use as well as venous hypertension and localized infection.  DVT was ruled out with venous doppler.   - Continue Clindamycin 600 mg q8   - Consider Terbinafine for treatment of his tinea pedis to help decrease the chance of recurrence of his cellulitis  - Anticipate discharge in a day or so after 24 hours afebrile.  2.  Hyponatremia: Also hypochloremic.  Creatinine is baseline.  He is on Lasix for swelling in his legs which can contribute to hyponatremia.  He also is not eating or drinking very well.  He appears euvolemic on exam with no JVD and no dry mucus membranes.  We will plan on stopping the Lasix today and check urine lytes tomorrow to work up his hyponatremia.  It may be secondary to his liver diease vs volume depletion vs SIADH.  He has a significant smoking and alcohol history.    - D/C lasix  - 500 ml NS bolus  - Check urine Lytes tomorrow  3.  Diabetes mellitus:  Well controlled on SSI  4.  HTN:  Better after the adjustment of his Metoprolol.  Continue to monitor.  5.  ETOH: On CIWA no signs of withdrawal.  6.  Dispo: Likely home tomorrow.    LOS: 3 days   PRIBULA,Leslie [name] 07/03/2012, 12:11 PM   10:33 PM I agree with HPI/GPe and A/P per Dr. 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

## 2012-07-04 LAB — COMPREHENSIVE METABOLIC PANEL
ALT: 56 U/L — ABNORMAL HIGH (ref 0–53)
AST: 50 U/L — ABNORMAL HIGH (ref 0–37)
Calcium: 8.9 mg/dL (ref 8.4–10.5)
GFR calc Af Amer: >90 mL/min (ref >90)
Sodium: 132 mEq/L — ABNORMAL LOW (ref 135–145)
Total Protein: 7 g/dL (ref 6.0–8.3)

## 2012-07-04 LAB — CBC
MCH: 32 pg (ref 26.0–34.0)
MCHC: 36.1 g/dL — ABNORMAL HIGH (ref 30.0–36.0)
Platelets: 172 10*3/uL (ref 150–400)

## 2012-07-04 LAB — GLUCOSE, CAPILLARY
Glucose-Capillary: 161 mg/dL — ABNORMAL HIGH (ref 70–99)
Glucose-Capillary: 160 mg/dL — ABNORMAL HIGH (ref 70–99)

## 2012-07-04 MED ORDER — OXYCODONE HCL 5 MG PO TABS: 5.0000 mg | ORAL_TABLET | ORAL | Status: DC | PRN

## 2012-07-04 MED ORDER — KETOCONAZOLE 2 % EX CREA: TOPICAL_CREAM | Freq: Two times a day (BID) | CUTANEOUS | Status: AC

## 2012-07-04 MED ORDER — CLINDAMYCIN HCL 300 MG PO CAPS: 600.0000 mg | ORAL_CAPSULE | Freq: Three times a day (TID) | ORAL | Status: AC

## 2012-07-04 MED ORDER — WHITE PETROLATUM GEL
Status: AC
  Administered 2012-07-04: 09:00:00
  Filled 2012-07-04: qty 5

## 2012-07-04 MED ORDER — OXYCODONE HCL 5 MG PO TABS
5.0000 mg | ORAL_TABLET | ORAL | Status: DC | PRN
Start: 2012-07-04 — End: 2012-07-04

## 2012-07-04 MED ORDER — OXYCODONE HCL 5 MG PO TABS: 5.0000 mg | ORAL_TABLET | Freq: Three times a day (TID) | ORAL | Status: AC | PRN

## 2012-07-04 MED ORDER — CLINDAMYCIN HCL 300 MG PO CAPS: 600.0000 mg | ORAL_CAPSULE | Freq: Three times a day (TID) | ORAL | Status: DC

## 2012-07-04 NOTE — Discharge Summary (Signed)
Internal [facility] Discharge Note  Name: Leslie [name] MRN: [id number] DOB: 1946/09/14 66 y.o.  Date of Admission: 06/30/2012  4:17 PM Date of Discharge: 07/04/2012 Attending Physician: Blane Ohara McDiarmid, MD  Discharge Diagnosis: 1.  Right lower extremity Cellulitis 2. Hyponatremia 3. Diabetes Mellitus 4. Hypertension 5. Alcohol abuse  Discharge Medications: Medication List  As of 07/04/2012  1:48 PM   STOP taking these medications         furosemide 20 MG tablet      HYDROcodone-acetaminophen 10-500 MG per tablet         TAKE these medications         albuterol 108 (90 BASE) MCG/ACT inhaler   Commonly known as: PROVENTIL HFA;VENTOLIN HFA   Inhale 2 puffs into the lungs every 4 (four) hours as needed.      allopurinol 300 MG tablet   Commonly known as: ZYLOPRIM   Take 300 mg by mouth daily.      amLODipine 10 MG tablet   Commonly known as: NORVASC   Take 10 mg by mouth daily.      aspirin 81 MG EC tablet   Take 81 mg by mouth daily.      clindamycin 300 MG capsule   Commonly known as: CLEOCIN   Take 2 capsules (600 mg total) by mouth every 8 (eight) hours.      esomeprazole 40 MG capsule   Commonly known as: NEXIUM   Take 40 mg by mouth daily before breakfast.      Fluticasone-Salmeterol 250-50 MCG/DOSE Aepb   Commonly known as: ADVAIR   Inhale 1 puff into the lungs 2 (two) times daily.      glipiZIDE 10 MG tablet   Commonly known as: GLUCOTROL   Take 10 mg by mouth every morning.      ketoconazole 2 % cream   Commonly known as: NIZORAL   Apply topically 2 (two) times daily. To bilateral bottom of the feet and toenails.      losartan 50 MG tablet   Commonly known as: COZAAR   Take 50 mg by mouth daily.      metFORMIN 1000 MG tablet   Commonly known as: GLUCOPHAGE   Take 1,000 mg by mouth 2 (two) times daily with a meal.      metoprolol 100 MG tablet   Commonly known as: LOPRESSOR   Take 200 mg by mouth 2 (two) times daily.      nitroGLYCERIN 0.4 MG SL tablet   Commonly known as: NITROSTAT   Place 0.4 mg under the tongue every 5 (five) minutes as needed.      oxyCODONE 5 MG immediate release tablet   Commonly known as: Oxy IR/ROXICODONE   Take 1-2 tablets (5-10 mg total) by mouth every 8 (eight) hours as needed.      pravastatin 80 MG tablet   Commonly known as: PRAVACHOL   Take 80 mg by mouth every evening.           Disposition and follow-up:   [name] was discharged from [facility] -  in Stable condition.  At the hospital follow up visit please address   1. [place]:  Please assess his compliance with his antibiotics.  He may benefit from a dermatology referral to check for the possibility of pigmented purpuric dermatoses.  The pictures of his rash was shown to Dr. Margot Ables who is IM/Derm and she recommended skin biopsy as well as SPEP and UPEP to  work this up.  - Please also recheck a Cmet to follow his liver enzymes as well as his hyponatremia.  We changed his pain medication to avoid tylenol given his alcohol abuse history and mildly increased transaminase levels during this hospitalization.   Follow-up Appointments: Follow-up Information    Follow up with [facility], Levada Dy, MD on 07/06/2012. (10 am)    Contact information:   [place] [phone]         Discharge Orders    Future Appointments: Provider: Department: Dept Phone: Center:   07/06/2012 10:15 AM Carolin Guernsey, MD [facility] [phone] [facility]     Future Orders Please Complete By Expires   Diet - low sodium heart healthy      Increase activity slowly      Discharge instructions      Comments:   1.  Start Clindamycin 300 mg tablets.  Take 2 tablets twice daily until gone for your cellulitis  2.  Stop Hydrocodone/Acetaminophen. You need to limit your intake of Tylenol.       - Start Oxycodone 5 mg tablets.  Take 1-2 tablets every 8 hours for your pain.     3.  Start Ketoconazole cream.  Please put this on the bottom of your feet and over the toenails on both feet.     Consultations: None  Procedures Performed:  No results found.  Lower extremity Venous Doppler: Summary: No evidence of deep vein thrombosis involving the right lower extremity.  Admission HPI: 66 yo male with history of diabetes, HTN, COPD and EtOH abuse here with complaint of foot swelling, pain and redness. States that noticed foot was becoming more swollen and painful on Sunday 06/27/12 and has progressively gotten worse since that time. He now has pain and redness all the way up to his knee. He states that this is the third time this has occurred. He does report having shaking chills on Monday, however he did not check his temperature. He does have decreased appetite. He denies any wound on the foot or leg, nausea, chest pain, headache, swelling of other extremities.   Hospital Course by problem list: 1.  Right lower extremity Cellulitis: The patient was admitted with his 3rd episode of cellulitis in his right lower extremity.  He was initially febrile with an increased WBC count.  He defervesced and his WBCs count decreased to normal. It was outlined in clinic initially and has been retreating daily. His leg is also significantly swollen. He has been using his Vicodin for the pain occasionally. He was initially started on Vanc and Zosyn but transitioned to PO clindamycin, today is day 4 of treatment with a plan to treat for 14 days. Pictures were taken with the patients permission 9/7 and showed to Dr. 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 This can be related to his baseline alcohol use as well as venous hypertension and localized infection. DVT was ruled out with venous doppler.  He is significantly improved on the date of discharge and is able to walk with a walker. With his history of alcohol abuse and liver disease we will need to limit his intake of tylenol so we will switch him to oral oxycodone 5 mg 1-2 tablets for his pain control instead of Vicodin.  He will be continued on Clindamycin 600 mg 3 times daily.    2. Hyponatremia: On admission the patient had hyponatremia as well as hypochloremic. Creatinine is baseline. He is on Lasix for swelling in his legs which can contribute to hyponatremia. He states that he had not been  eating or drinking very well up until admission.  He appears euvolemic on exam with no JVD and no dry mucus membranes. Lasix was stopped on 9/7 and he was given a 500 cc bolus of saline. Today his sodium is improved to 132.  It may be secondary to his liver diease vs volume depletion vs SIADH. He has a significant smoking and alcohol history.  He will need continued monitoring as an outpatient and consider work up for more concerning causes of his hyponatremia.  His lasix was held on discharge.  3. Diabetes Mellitus: his last A1C was 6.5 this July.  As an outpatient he is controlled on metformin and Glipizide.  He was watched on SSI during his stay and had well controlled blood sugars.  We will restart his Metformin and glipizide as an outpatient.   4. Hypertension:  On admission his blood pressure medications had initially been held and his SBP was running high.  He was restarted on his home medications and this helped control his blood pressure relatively well.  He will need continued monitoring.  5. Alcohol abuse: The patient continues to drink at least a 6 pack of beer daily and is not interested in quitting.  He was advised that his continued alcohol abuse is contributing to his continued medical problems.  He will need to continue to be encouraged to cut back his alcohol abuse.  7.  Skin lesion on right nostril: Patient states that this has been  present for many years.  If he is referred to Dermatology They should be asked to evaluate this lesion as well.   Discharge Vitals:  BP 157/62  Pulse 74  Temp 98.4 F (36.9 C) (Oral)  Resp 18  Ht 5\' 6"  (1.676 m)  Wt 222 lb (100.699 kg)  BMI 35.83 kg/m2  SpO2 99%  Discharge Labs:  Results for orders placed during the hospital encounter of 06/30/12 (from the past 24 hour(s))  GLUCOSE, CAPILLARY     Status: Abnormal   Collection Time   07/03/12  4:06 PM      Component Value Range   Glucose-Capillary 182 (*) 70 - 99 mg/dL  GLUCOSE, CAPILLARY     Status: Abnormal   Collection Time   07/03/12  9:43 PM      Component Value Range   Glucose-Capillary 206 (*) 70 - 99 mg/dL  CBC     Status: Abnormal   Collection Time   07/04/12  5:25 AM      Component Value Range   WBC 7.1  4.0 - 10.5 K/uL   RBC 3.72 (*) 4.22 - 5.81 MIL/uL   Hemoglobin 11.9 (*) 13.0 - 17.0 g/dL   HCT 33.0 (*) 39.0 - 52.0 %   MCV 88.7  78.0 - 100.0 fL   MCH 32.0  26.0 - 34.0 pg   MCHC 36.1 (*) 30.0 - 36.0 g/dL   RDW 13.2  11.5 - 15.5 %   Platelets 172  150 - 400 K/uL  COMPREHENSIVE METABOLIC PANEL     Status: Abnormal   Collection Time   07/04/12  5:25 AM      Component Value Range   Sodium 132 (*) 135 - 145 mEq/L  Potassium 3.7  3.5 - 5.1 mEq/L   Chloride 94 (*) 96 - 112 mEq/L   CO2 29  19 - 32 mEq/L   Glucose, Bld 175 (*) 70 - 99 mg/dL   BUN 9  6 - 23 mg/dL   Creatinine, Ser 0.81  0.50 - 1.35 mg/dL   Calcium 8.9  8.4 - 10.5 mg/dL   Total Protein 7.0  6.0 - 8.3 g/dL   Albumin 2.7 (*) 3.5 - 5.2 g/dL   AST 50 (*) 0 - 37 U/L   ALT 56 (*) 0 - 53 U/L   Alkaline Phosphatase 132 (*) 39 - 117 U/L   Total Bilirubin 1.5 (*) 0.3 - 1.2 mg/dL   GFR calc non Af Amer >90  >90 mL/min   GFR calc Af Amer >90  >90 mL/min  GLUCOSE, CAPILLARY     Status: Abnormal   Collection Time   07/04/12  7:29 AM      Component Value Range   Glucose-Capillary 161 (*) 70 - 99 mg/dL  GLUCOSE, CAPILLARY     Status: Abnormal   Collection Time     07/04/12 11:46 AM      Component Value Range   Glucose-Capillary 160 (*) 70 - 99 mg/dL   Signed: Janecia Palau 07/04/2012, 1:48 PM   Time Spent on Discharge: 35 min

## 2012-07-04 NOTE — Progress Notes (Signed)
Subjective: Up in the chair today.  Ambulating with a rear wheel walker.  No fever or chills, eating well.  States his redness is much better and the pain is well controlled on the medications.   Objective: Vital signs in last 24 hours: Filed Vitals:   07/03/12 1400 07/03/12 2051 07/03/12 2141 07/04/12 0657  BP: 179/75  155/62 157/62  Pulse: 75  78 74  Temp: 98.5 F (36.9 C)  98.6 F (37 C) 98.4 F (36.9 C)  TempSrc: Oral     Resp: 18  18 18   Height:      Weight:      SpO2: 95% 93% 99% 99%   Weight change:   Intake/Output Summary (Last 24 hours) at 07/04/12 1305 Last data filed at 07/04/12 1200  Gross per 24 hour  Intake   1260 ml  Output   3650 ml  Net  -2390 ml   Vitals reviewed. General: resting in bed, NAD, skin growth with no breakdown noted on the right nostril.  HEENT: PERRL, EOMI, no scleral icterus Cardiac: RRR, no rubs, murmurs or gallops Pulm: clear to auscultation bilaterally, no wheezes, rales, or rhonchi Abd: soft, nontender, nondistended, BS present Ext: right lower leg with 2+ pitting edema to the knee. The erythematous area has retreated today and is now mostly centered around his medial malleolus.  No annular crusting or flaking noted. Toe nails are hyperkeratotic with crusting between the toes.  Neuro: alert and oriented X3, cranial nerves II-XII grossly intact, strength and sensation to light touch equal in bilateral upper and lower extremities  Lab Results: Basic Metabolic Panel:  Lab [id number] 07/03/12 0620  NA 132* 129*  K 3.7 3.6  CL 94* 93*  CO2 29 28  GLUCOSE 175* 151*  BUN 9 7  CREATININE 0.81 0.76  CALCIUM 8.9 8.8  MG -- --  PHOS -- --   Liver Function Tests:  Lab 07/04/12 0525 06/30/12 1727  AST 50* 31  ALT 56* 23  ALKPHOS 132* 61  BILITOT 1.5* 1.4*  PROT 7.0 7.4  ALBUMIN 2.7* 3.4*   CBC:  Lab 07/04/12 0525 07/03/12 0620 06/30/12 1727  WBC 7.1 7.8 --  NEUTROABS -- -- 11.4*  HGB 11.9* 11.7* --  HCT 33.0* 32.5* --    MCV 88.7 88.1 --  PLT 172 140* --   CBG:  Lab 07/04/12 1146 07/04/12 0729 07/03/12 2143 07/03/12 1606 07/03/12 1133 07/03/12 0644  GLUCAP 160* 161* 206* 182* 157* 149*   Micro Results: No results found for this or any previous visit (from the past 240 hour(s)). Studies/Results: No results found. Medications: I have reviewed the patient's current medications. Scheduled Meds:   . allopurinol  300 mg Oral Daily  . amLODipine  10 mg Oral Daily  . aspirin EC  81 mg Oral Daily  . atorvastatin  20 mg Oral q1800  . clindamycin  600 mg Oral Q8H  . Fluticasone-Salmeterol  1 puff Inhalation BID  . folic acid  1 mg Oral Daily  . heparin  5,000 Units Subcutaneous Q8H  . insulin aspart  0-15 Units Subcutaneous TID WC  . losartan  50 mg Oral Daily  . metoprolol  200 mg Oral BID  . multivitamin with minerals  1 tablet Oral Daily  . sodium chloride  500 mL Intravenous Once  . thiamine  100 mg Oral Daily  . white petrolatum      . DISCONTD: furosemide  20 mg Oral Daily  . DISCONTD: pantoprazole  40 mg Oral Q1200  . DISCONTD: thiamine  100 mg Intravenous Daily   Continuous Infusions:   . sodium chloride 20 mL (07/03/12 0844)   PRN Meds:.albuterol, morphine injection, oxyCODONE, DISCONTD: acetaminophen, DISCONTD: HYDROcodone-acetaminophen, DISCONTD: LORazepam, DISCONTD: LORazepam, DISCONTD: ondansetron (ZOFRAN) IV, DISCONTD: ondansetron, DISCONTD: oxyCODONE  Assessment/Plan: 1. Right lower extremity Cellulitis: The patient was admitted with his 3rd episode of cellulitis in his right lower extremity. He was initially febrile with an increased WBC count. He defervesced and his WBCs count decreased to normal. It was outlined in clinic initially and has been retreating daily. His leg is also significantly swollen. He has been using his Vicodin for the pain occasionally. He was initially started on Vanc and Zosyn but transitioned to PO clindamycin, today is day 4 of treatment with a plan to treat  for 14 days. Pictures were taken with the patients permission 9/7 and showed to Dr. 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 This can be related to his baseline alcohol use as well as venous hypertension and localized infection. DVT was ruled out with venous doppler. He is significantly improved on the date of discharge and is able to walk with a walker. With his history of alcohol abuse and liver disease we will need to limit his intake of tylenol so we will switch him to oral oxycodone 5 mg 1-2 tablets for his pain control instead of Vicodin.     - Continue Clindamycin 600 mg q8   - Consider Terbinafine for treatment of his tinea pedis to help decrease the chance of   recurrence of his cellulitis   - Discharge today.   2. Hyponatremia: Also hypochloremic. Creatinine is baseline. He is on Lasix for swelling in his legs which can contribute to hyponatremia. He also is not eating or drinking very well. He appears euvolemic on exam with no JVD and no dry mucus membranes. Lasix was stopped on 9/7 and he was given a 500 cc bolus of saline.  Today his sodium is improved to 132.  It may be secondary to his liver diease vs volume depletion vs SIADH. He has a significant smoking and alcohol history.   - Consider continued work up as an outpatient.  - Hold his Lasix until his follow up.   3. Diabetes mellitus: Well controlled on SSI.  Will restart his metformin on discharge.   4. HTN: Better after the adjustment of his Metoprolol. Continue to monitor.   5. ETOH: On CIWA no signs of withdrawal.   6. Dispo: Home today to finish off a 14 day course of Clindamycin.  Encouraged him to stop alcohol intake but he is not receptive.    LOS: 4 days   Tymel Conely 07/04/2012, 1:05 PM

## 2012-07-04 NOTE — Evaluation (Signed)
Physical Therapy Evaluation Patient Details Name: Leslie [name] MRN: [id number] DOB: Mar 09, 1946 Today's Date: 07/04/2012 Time: [id number] PT Time Calculation (min): 11 min  PT Assessment / Plan / Recommendation Clinical Impression  Pt currently with cellulitis R foot resulting in painful ambulation.  Pt demo Mod I gait with RW.  He reports his home is ILF, RW accessible and one level.  He is declining any HHPT, stating he can do his own exercising and strengthening.  He has a scooter for mobility outside of the home.  No balance or safety deficits noted.    PT Assessment  Patent does not need any further PT services    Follow Up Recommendations  No PT follow up    Barriers to Discharge        Equipment Recommendations  None recommended by PT    Recommendations for Other Services     Frequency      Precautions / Restrictions Precautions Precautions: None   Pertinent Vitals/Pain 5/10      Mobility  Bed Mobility Bed Mobility: Supine to Sit;Sit to Supine Supine to Sit: 6: Modified independent (Device/Increase time);With rails;HOB flat Sit to Supine: 6: Modified independent (Device/Increase time);With rail;HOB flat Transfers Transfers: Sit to Stand;Stand to Sit Sit to Stand: 6: Modified independent (Device/Increase time);From bed;With upper extremity assist Stand to Sit: 6: Modified independent (Device/Increase time);With armrests;To chair/3-in-1 Ambulation/Gait Ambulation/Gait Assistance: 5: Supervision Ambulation Distance (Feet): 150 Feet Assistive device: Rolling walker Gait Pattern: Antalgic Gait velocity: decreased    Exercises     PT Diagnosis:    PT Problem List:   PT Treatment Interventions:     PT Goals    Visit Information  Last PT Received On: 07/04/12 Assistance Needed: +1    Subjective Data  Subjective: "I feel stronger today." Patient Stated Goal: home   Prior [place] Lives With: Alone Available Help at Discharge: Available  PRN/intermittently;Friend(s) Type of Home: Independent living facility Home Access: Level entry Home Layout: One level Bathroom Accessibility: Yes How Accessible: Accessible via walker Home Adaptive Equipment: Walker - rolling Additional Comments: scooter used for transportation out of apartment Prior Function Level of Independence: Independent Driving: No Communication Communication: No difficulties    Cognition  Overall Cognitive Status: Appears within functional limits for tasks assessed/performed Arousal/Alertness: Awake/alert Orientation Level: Appears intact for tasks assessed Behavior During Session: [facility] for tasks performed    Extremity/Trunk Assessment     Balance    End of Session PT - End of Session Equipment Utilized During Treatment: Gait belt Activity Tolerance: Patient tolerated treatment well Patient left: in chair;with call bell/phone within reach Nurse Communication: Mobility status  GP     Lorriane Shire 07/04/2012, 10:40 AM  Lorrin Goodell, PT  Office # [phone] Pager [phone]

## 2012-07-05 NOTE — Discharge Summary (Signed)
[facility] Discharge Summary  Patient name: Leslie [name] Medical record number: [id number] Date of birth: [birth year] Age: 66 y.o. Gender: male Date of Admission: (Not on file)  Date of Discharge: 05/03/2012 Admitting Physician: No admitting provider for patient encounter.  Primary Care Provider: Boykin Nearing, MD  Indication for Hospitalization: Cellulitis of Right foot Discharge Diagnoses: Cellulitis of Right foot  Consultations: None  Significant Labs and Imaging:  Na 127 (admission), 132 (discharge) K 3.4 (admission), 3.7(discharge CBGs: 76-354 WBC 13.9 (admission), 82% N  7.1 (discharge)  R lower extremety venous duplex 07/02/2012 No evidence of deep vein thrombosis involving the right lower extremity.  Procedures: None  Brief Hospital Course:   1. Right lower extremity Cellulitis: The patient was admitted with his 3rd episode of cellulitis in his right lower extremity. He was initially febrile and had an increased WBC count which both improved with IV vancomycin and zosyn. The area of concern was outlined in clinic initially and began retreating noticeably after the first night of antibiotics. He was pain controlled with Vicodin.  After 2 days of IV vancomycin and zosyn he was transitioned to PO clindamycin. By the day of discharge he was able to ambulate with a walker and the swelling and pain had improved and he was sent home on PO clindamycin to complete a 14 day course.    2. Hyponatremia: He is noted to have a poor diet as well as chronic alcoholism, but It may be secondary to volume depletion vs SIADH vs thyroid dysfunction. He appeared euvolemic on exam with no JVD and no dry mucus membranes. He was given a 500 cc bolus of saline on 9/7 and his sodium improved to 132.   His sodium was as low as 126 during the admission.   3. Diabetes mellitus: Controlled on SSI throughout the admission, metformin was restarted at discharge.   4. HTN:  Treated throughout hospitalization with his home medications.    5. ETOH: He was monitored with CIWA with no signs of withdrawal.    Discharge Medications:  Medication List  As of 07/05/2012  2:23 PM   ASK your doctor about these medications         albuterol 108 (90 BASE) MCG/ACT inhaler   Commonly known as: PROVENTIL HFA;VENTOLIN HFA   Inhale 2 puffs into the lungs every 4 (four) hours as needed.      aspirin 81 MG EC tablet   Take 81 mg by mouth daily.      Fluticasone-Salmeterol 250-50 MCG/DOSE Aepb   Commonly known as: ADVAIR   Inhale 1 puff into the lungs 2 (two) times daily.      HYDROcodone-acetaminophen 10-500 MG per tablet   Commonly known as: LORTAB   Take 1 tablet by mouth every 8 (eight) hours as needed for pain.      ketoconazole 2 % cream   Commonly known as: NIZORAL   Apply topically 2 (two) times daily. To affected are of left arm.   Stop taking on: 05/07/2013      metFORMIN 1000 MG tablet   Commonly known as: GLUCOPHAGE   Take 1 tablet (1,000 mg total) by mouth 2 (two) times daily with a meal.      nitroGLYCERIN 0.4 MG SL tablet   Commonly known as: NITROSTAT   Place 1 tablet (0.4 mg total) under the tongue every 5 (five) minutes as needed for chest pain.   Stop taking on: 11/11/2012  Issues for Follow Up: Hyponatremia  Outstanding Results: None  Discharge Instructions: Please refer to Patient Instructions section of EMR for full details.  Patient was counseled important signs and symptoms that should prompt return to medical care, changes in medications, dietary instructions, activity restrictions, and follow up appointments.  Significant instructions noted below:  Discharge Condition: Stable  Kenn File, MD 07/05/2012, 2:23 PM

## 2012-07-06 ENCOUNTER — Ambulatory Visit (INDEPENDENT_AMBULATORY_CARE_PROVIDER_SITE_OTHER): Payer: Medicare HMO | Admitting: Family Medicine

## 2012-07-06 ENCOUNTER — Encounter: Payer: Self-pay | Admitting: Family Medicine

## 2012-07-06 VITALS — BP 149/79 | HR 87 | Temp 98.1°F

## 2012-07-06 DIAGNOSIS — Z77090 Contact with and (suspected) exposure to asbestos: Secondary | ICD-10-CM

## 2012-07-06 DIAGNOSIS — L03115 Cellulitis of right lower limb: Secondary | ICD-10-CM

## 2012-07-06 DIAGNOSIS — L02619 Cutaneous abscess of unspecified foot: Secondary | ICD-10-CM

## 2012-07-06 DIAGNOSIS — E871 Hypo-osmolality and hyponatremia: Secondary | ICD-10-CM

## 2012-07-06 LAB — COMPREHENSIVE METABOLIC PANEL
ALT: 40 U/L (ref 0–53)
Alkaline Phosphatase: 107 U/L (ref 39–117)
Creat: 0.85 mg/dL (ref 0.50–1.35)
Sodium: 132 mEq/L — ABNORMAL LOW (ref 135–145)
Total Bilirubin: 1.1 mg/dL (ref 0.3–1.2)
Total Protein: 6.9 g/dL (ref 6.0–8.3)

## 2012-07-06 LAB — CBC WITH DIFFERENTIAL/PLATELET
Basophils Absolute: 0 10*3/uL (ref 0.0–0.1)
Eosinophils Absolute: 0.1 10*3/uL (ref 0.0–0.7)
Eosinophils Relative: 1 % (ref 0–5)
HCT: 34.1 % — ABNORMAL LOW (ref 39.0–52.0)
Lymphs Abs: 1.6 10*3/uL (ref 0.7–4.0)
MCH: 32.2 pg (ref 26.0–34.0)
MCV: 91.4 fL (ref 78.0–100.0)
Monocytes Absolute: 1 10*3/uL (ref 0.1–1.0)
Platelets: 317 10*3/uL (ref 150–400)
RDW: 14.2 % (ref 11.5–15.5)

## 2012-07-06 MED ORDER — HYDROCODONE-ACETAMINOPHEN 10-500 MG PO TABS: 1.0000 | ORAL_TABLET | Freq: Three times a day (TID) | ORAL | Status: DC | PRN

## 2012-07-06 NOTE — Assessment & Plan Note (Addendum)
Patient reports persistent right lower extremity pain and worsening of redness on his right foot.  The area of erythema is markedly below the demarcated area made during his hospitalization, although his foot is erythematous and swollen (but non-warmth). He does not show signs of systemic infection.  Patient was precepted with Dr. McDiarmid who saw the patient during his hospitalization.  -Will check CBC and follow-up on CMET (history of alcoholism and mildly LFTs during hospitalization)>>>WBC normal, LFT normal  -Right lower extremity wrapped in Unna boot today -Referred for Home Health SN to check on patient daily for the next few days since he lives alone. PT evaluation during hospitalization did not recommend HH PT, however, patient would probably benefit since he is not ambulating  -Continue Clindamycin -Will increase narcotics back to pre-hospitalization dose: Vicodin 10/500 tid prn (he was on 5/500 qd-bid prn after hospitalization) -Follow-up in 3 days

## 2012-07-06 NOTE — Progress Notes (Signed)
  Subjective:    Patient ID: Leslie [name], male    DOB: Apr 09, 1946, 66 y.o.   MRN: [id number]  HPI # Hospital follow-up for right leg cellulitis The patient was admitted 09/04-09/08 for what he reports to be his third episode of cellulitis in that extremity.  He was discharged to home with clindamycin, which he reports he has been taking.  He lives alone in an assisted living facility. He is accompanied by his niece today. He has not been ambulating since being discharged to home and has had a difficulty time moving around due to the pain. Prior to the infection, he was able to ambulate and would go swimming for exercise.  He denies fevers/chills or nausea.  He complains of worsening rash and swelling on his foot.   Review of Systems Per HPI  Allergies, medication, past medical history reviewed.  Significant for: -Chronic hyponatremia -Alcohol abuse, mildly elevated LFTs during hospitalization--he denies drinking alcohol since has been home -COPD, history of tobacco abuse--last smoked 13 years ago  -Chronic low back pain  -Gout -History of chest pain at rest   Lower extremity Dopplers negative during hospitalization    Objective:   Physical Exam GEN: appears uncomfortable CV: RRR PULM: NI WOB; coarse breath sounds bilaterally without obvious wheezing or rales; no ronchi EXT: both covered with compression stockings   RIGHT LEG: area of cellulitis during hospitalization demarcated with ink; area of redness is significantly below that line superiorily; foot is swollen (1-2+ pitting edema) and erythematous and flaky but non-warm; 2+ pedal pulse; significant tenderness throughout lower extremity especially in popliteal fossa where no nodules were palpated; no cords; calves symmetric    Assessment & Plan:

## 2012-07-06 NOTE — Discharge Summary (Signed)
Reviewed and agree with Dr. Alen Bleacher management and documentation.

## 2012-07-06 NOTE — Patient Instructions (Addendum)
We will try to get a nurse to visit you daily for the next few days ([place])  We will wrap your foot in a Unna boot  You may increase your pain medication. Take hydrocodone 10/500 up to every 8 hours as needed  Follow-up with Dr. Verdie Drown on Friday

## 2012-07-07 ENCOUNTER — Ambulatory Visit: Payer: Medicare HMO | Admitting: Family Medicine

## 2012-07-07 DIAGNOSIS — E871 Hypo-osmolality and hyponatremia: Secondary | ICD-10-CM | POA: Insufficient documentation

## 2012-07-07 NOTE — Assessment & Plan Note (Signed)
Corrected Na (for Glc 215) 134, which is improved

## 2012-07-08 ENCOUNTER — Telehealth: Payer: Self-pay | Admitting: *Deleted

## 2012-07-08 NOTE — Telephone Encounter (Signed)
[facility] had orders to go out and check on his cellulitis.  She states that since he has the unna boot on and is to come back in tomorrow there is really nothing for her to do.  Advised to not go out today and wait until tomorrow when she hears from [place] on wether or not services are needed.  Will forward to MD for orders to go to [facility].  ANGELA, Please reevaluate and see if home nursing is needed and we will call with verbal orders if they are. Leslie [name], Salome Spotted

## 2012-07-09 ENCOUNTER — Ambulatory Visit (INDEPENDENT_AMBULATORY_CARE_PROVIDER_SITE_OTHER): Payer: Medicare HMO | Admitting: Family Medicine

## 2012-07-09 VITALS — BP 148/78 | HR 66 | Temp 98.9°F

## 2012-07-09 DIAGNOSIS — L03115 Cellulitis of right lower limb: Secondary | ICD-10-CM

## 2012-07-09 DIAGNOSIS — L03119 Cellulitis of unspecified part of limb: Secondary | ICD-10-CM

## 2012-07-09 DIAGNOSIS — L02619 Cutaneous abscess of unspecified foot: Secondary | ICD-10-CM

## 2012-07-09 NOTE — Progress Notes (Signed)
  Subjective:    Patient ID: Leslie [name], male    DOB: 24-Jan-1946, 66 y.o.   MRN: [id number]  HPI # Follow-up right leg cellulitis He has been wearing the Unna boot He does not report improvement in pain but his rash appears better His pharmacy was not refilling his new Rx for his narcotics because he was told it was too soon to have it filled ROS: denies difficulty breathing  Review of Systems Per HPI Denies fevers/chills/nausea He is still not ambulating well at home  Allergies, medication, past medical history reviewed.     Objective:   Physical Exam GEN: NAD PSYCH: irritable PULM: NI WOB SKIN: rash on tibia appears improved; erythema on foot appears the same but the swelling is better and a lot of his skin is flaking off EXT: 2+ pedal pulses; calf tenderness on right without palpable cords or swelling NEURO: moves legs equally  Duplex, right leg on 09/06: negative for DVT    Assessment & Plan:

## 2012-07-09 NOTE — Patient Instructions (Addendum)
Your prescription for your medicine is ready at the pharmacy.  You can take 1 tablet of the 10/500 every 8 hours as needed.   Follow-up in 1 week with Dr. [name] Blackwater to re-evaluate your leg infection  We will have a nurse see at home to monitor the skin infection   If you have fevers or worsening rash, please come to the clinic sooner

## 2012-07-09 NOTE — Assessment & Plan Note (Addendum)
Cellulitis appears to be improving, and he continues to be afebrile.  Unna boot removed today. -Will re-start Home Health SN and request PT since he is not ambulating well at home -I called the pharmacy and approved Rx for Vicodin/Norco 10/500 -Follow-up in 1 week

## 2012-07-12 NOTE — Progress Notes (Signed)
Seen and agree with content of Dr. Jill Alexanders note  Verneita Griffes, MD (P) [phone]

## 2012-07-12 NOTE — Discharge Summary (Signed)
I have independently seen and examined this patient and have discussed the POC with Dr. Obie Dredge.  Verneita Griffes, MD (P) [phone]

## 2012-07-14 ENCOUNTER — Telehealth: Payer: Self-pay | Admitting: Family Medicine

## 2012-07-14 MED ORDER — CYCLOBENZAPRINE HCL 10 MG PO TABS: 10.0000 mg | ORAL_TABLET | Freq: Two times a day (BID) | ORAL | Status: DC | PRN

## 2012-07-14 NOTE — Telephone Encounter (Signed)
Called patient back. He has soreness in his leg and trouble sleeping. He states that the swelling and redness continues to improve. He is due to finish antibiotics on 07/16/12.   P:  send in flexeril BID x 15 days  Patient to f/u with me in clinic in 1 weeks per Dr. Candace [place]'s instructions

## 2012-07-14 NOTE — Telephone Encounter (Signed)
Having muscle spasms and is requesting a muscle relaxer.  Please call patient back to inform when rx sent.

## 2012-07-23 ENCOUNTER — Ambulatory Visit (INDEPENDENT_AMBULATORY_CARE_PROVIDER_SITE_OTHER): Payer: Medicare HMO | Admitting: Family Medicine

## 2012-07-23 ENCOUNTER — Encounter: Payer: Self-pay | Admitting: Family Medicine

## 2012-07-23 VITALS — BP 119/75 | HR 77 | Temp 98.1°F | Ht 66.0 in | Wt 204.0 lb

## 2012-07-23 DIAGNOSIS — L02619 Cutaneous abscess of unspecified foot: Secondary | ICD-10-CM

## 2012-07-23 DIAGNOSIS — L03115 Cellulitis of right lower limb: Secondary | ICD-10-CM

## 2012-07-23 DIAGNOSIS — L608 Other nail disorders: Secondary | ICD-10-CM

## 2012-07-23 DIAGNOSIS — L609 Nail disorder, unspecified: Secondary | ICD-10-CM

## 2012-07-23 MED ORDER — TRAMADOL HCL 50 MG PO TABS: 50.0000 mg | ORAL_TABLET | Freq: Three times a day (TID) | ORAL | Status: DC | PRN

## 2012-07-23 NOTE — Assessment & Plan Note (Signed)
Nails clipped today. Nystatin cream to nails BID.

## 2012-07-23 NOTE — Progress Notes (Signed)
Subjective:     Patient ID: Leslie [name], male   DOB: Jul 12, 1946, 66 y.o.   MRN: [id number]  HPI 66 yo M with history significant for DM2 presents for f/u visit to discuss the following:  1. RLE cellulitis: improving. Patient has completed antibiotics, clindamycin x 2 week course. Redness and swelling improving. Still with pain. Keeping leg elevated at rest. Walking with a walker.  Has not returned to swimming or riding scooter. Tried flexeril but it gave him dry mouth and hallucinations. Third episode of LE cellulitis in 2 years. This episode was the most severe.  Review of Systems As per HPI    Objective:   Physical Exam BP 119/75  Pulse 77  Temp 98.1 F (36.7 C) (Oral)  Ht 5\' 6"  (1.676 m)  Wt 204 lb (92.534 kg)  BMI 32.93 kg/m2 General appearance: alert, cooperative and no distress Extremities: RLE swollen with blanching erythema. No skin breakdown.  Pulses: DP and PT pulses 1+ bilaterally.  Skin: Skin in feet thick and scaly callous bilaterally especially on plantar surface. Toenials thick, yellow and long.   Diabetic foot exam and toenail clipping done in office today.  Lab Results  Component Value Date   HGBA1C 6.5 05/07/2012       Assessment and Plan:

## 2012-07-23 NOTE — Assessment & Plan Note (Signed)
A: improving. COmpleted antibiotics. P:  Keep leg elevated Assess blood flow with ABI first. Made need arteriogram.  Patient will to undergo revascularization if indicated based on work up. Goal is preventing/reducing severity of LE cellulitis.

## 2012-07-23 NOTE — Patient Instructions (Addendum)
Leslie [name],  Thank you for coming in today.  For nails; Continue Nizoral cream twice daily.  For healing cellulitis: Take tramadol for pain Keep legs elevated Call if pain or swelling worsens.  I have ordered ABIs to be performed across the street at the cardiologist office.   Dr. [name] Blackwater

## 2012-08-04 ENCOUNTER — Other Ambulatory Visit: Payer: Self-pay | Admitting: Family Medicine

## 2012-08-09 ENCOUNTER — Ambulatory Visit (INDEPENDENT_AMBULATORY_CARE_PROVIDER_SITE_OTHER): Payer: Medicare HMO | Admitting: Family Medicine

## 2012-08-09 ENCOUNTER — Encounter: Payer: Self-pay | Admitting: Family Medicine

## 2012-08-09 VITALS — BP 161/81 | HR 75 | Temp 98.5°F | Ht 66.0 in | Wt 195.0 lb

## 2012-08-09 DIAGNOSIS — I10 Essential (primary) hypertension: Secondary | ICD-10-CM

## 2012-08-09 DIAGNOSIS — M109 Gout, unspecified: Secondary | ICD-10-CM

## 2012-08-09 DIAGNOSIS — L03115 Cellulitis of right lower limb: Secondary | ICD-10-CM

## 2012-08-09 DIAGNOSIS — Z79899 Other long term (current) drug therapy: Secondary | ICD-10-CM

## 2012-08-09 DIAGNOSIS — E871 Hypo-osmolality and hyponatremia: Secondary | ICD-10-CM

## 2012-08-09 DIAGNOSIS — L03119 Cellulitis of unspecified part of limb: Secondary | ICD-10-CM

## 2012-08-09 DIAGNOSIS — IMO0002 Reserved for concepts with insufficient information to code with codable children: Secondary | ICD-10-CM

## 2012-08-09 DIAGNOSIS — L02619 Cutaneous abscess of unspecified foot: Secondary | ICD-10-CM

## 2012-08-09 DIAGNOSIS — E118 Type 2 diabetes mellitus with unspecified complications: Secondary | ICD-10-CM

## 2012-08-09 LAB — BASIC METABOLIC PANEL
BUN: 12 mg/dL (ref 6–23)
Chloride: 98 mEq/L (ref 96–112)
Creat: 0.73 mg/dL (ref 0.50–1.35)
Glucose, Bld: 118 mg/dL — ABNORMAL HIGH (ref 70–99)
Potassium: 4.3 mEq/L (ref 3.5–5.3)

## 2012-08-09 MED ORDER — GLIPIZIDE 10 MG PO TABS: 10.0000 mg | ORAL_TABLET | Freq: Every morning | ORAL | Status: DC

## 2012-08-09 MED ORDER — AMLODIPINE BESYLATE 10 MG PO TABS: 10.0000 mg | ORAL_TABLET | Freq: Every day | ORAL | Status: AC

## 2012-08-09 MED ORDER — METFORMIN HCL 1000 MG PO TABS: 1000.0000 mg | ORAL_TABLET | Freq: Two times a day (BID) | ORAL | Status: DC

## 2012-08-09 MED ORDER — HYDROCODONE-ACETAMINOPHEN 5-500 MG PO TABS: 1.0000 | ORAL_TABLET | Freq: Three times a day (TID) | ORAL | Status: DC | PRN

## 2012-08-09 MED ORDER — PRAVASTATIN SODIUM 80 MG PO TABS: 80.0000 mg | ORAL_TABLET | Freq: Every evening | ORAL | Status: DC

## 2012-08-09 MED ORDER — HYDROCODONE-ACETAMINOPHEN 10-500 MG PO TABS: 1.0000 | ORAL_TABLET | Freq: Three times a day (TID) | ORAL | Status: DC | PRN

## 2012-08-09 MED ORDER — LOSARTAN POTASSIUM 50 MG PO TABS: 50.0000 mg | ORAL_TABLET | Freq: Every day | ORAL | Status: DC

## 2012-08-09 MED ORDER — METOPROLOL TARTRATE 100 MG PO TABS: 200.0000 mg | ORAL_TABLET | Freq: Two times a day (BID) | ORAL | Status: DC

## 2012-08-09 NOTE — Assessment & Plan Note (Signed)
A: greatly improved. Residual medial knee pain.  P: r knee x ray ABI in 2 days

## 2012-08-09 NOTE — Assessment & Plan Note (Signed)
A: improved. A1c in prediabetes range.  Meds: compliant with metformin and glipizide and tolerating well. P: Refill medications.  Continue current management. Encourage exercise as tolerated.

## 2012-08-09 NOTE — Progress Notes (Signed)
Subjective:     Patient ID: Leslie [name], male   DOB: [birth year], 66 y.o.   MRN: [id number]  HPI 66 yo M presents for f/u of the following:  1. RLE cellulitis: improved. Still with small amount of pain mostly in his knee. Taking vicodin for chronic back pain that helps with residual leg pain. Has ABIs scheduled for this Wednesday at [facility] cardiology.   2. Chronic back pain: persistent. Compliant with Vicodin but now taking it every 4 hours. Has not been back to swimming/hot tub which helped his pain due to his recent cellulitis.   3. DM: taking metformin. Denies HA, CP, SOB, GI upset, polyuria and polydipsia.   Review of Systems As per HPI     Objective:   Physical Exam BP 161/81  Pulse 75  Temp 98.5 F (36.9 C) (Oral)  Ht 5\' 6"  (1.676 m)  Wt 195 lb (88.451 kg)  BMI 31.47 kg/m2 General appearance: alert, cooperative and no distress Extremities: RLE with trace edema. decreased DP pulse. Brisk capillary refill. R medial knee pain on palpation.  Full range of motion.  No posterior knee pain on palpation. No joint effusion or erythema.      Assessment and Plan:

## 2012-08-09 NOTE — Patient Instructions (Addendum)
Leslie [name],   Thank you for coming in today. Please get your R knee x-ray at [facility] at your earliest convenience.   Continue to elevate your knee as tolerated.  Keep your appt with vascular.   Dr. [name] Blackwater

## 2012-08-09 NOTE — Assessment & Plan Note (Signed)
A: chronic low back pain from herniated disk.  P: refill vicodin, advised TID.  Pt an alcoholic and resistant to extra tylenol.  Advised patient to take  vicodin as prescribed.  gave 3 month refill.  Will not refill again until 11/09/12.

## 2012-08-10 ENCOUNTER — Encounter: Payer: Self-pay | Admitting: Family Medicine

## 2012-08-11 ENCOUNTER — Encounter (INDEPENDENT_AMBULATORY_CARE_PROVIDER_SITE_OTHER): Payer: Medicare HMO

## 2012-08-11 ENCOUNTER — Other Ambulatory Visit: Payer: Self-pay | Admitting: Cardiology

## 2012-08-11 DIAGNOSIS — I739 Peripheral vascular disease, unspecified: Secondary | ICD-10-CM

## 2012-08-11 DIAGNOSIS — L03115 Cellulitis of right lower limb: Secondary | ICD-10-CM

## 2012-09-03 ENCOUNTER — Telehealth: Payer: Self-pay | Admitting: Cardiovascular Disease

## 2012-09-03 NOTE — Telephone Encounter (Signed)
No answer and no way to leave message, will continue to call.

## 2012-09-03 NOTE — Telephone Encounter (Signed)
New problem:  Test results.  

## 2012-09-03 NOTE — Telephone Encounter (Signed)
Called patient back. He wanted results of leg dopplers. Gave prelim results. Advised him that results were not sent to Dr.Cooper, therefore he did not receive a call with results. He is aware that he will get a call back next week with MD recommendations.

## 2012-09-05 NOTE — Telephone Encounter (Signed)
Sent a note to Lela asking her to set him up with an OV so that he can be evaluated - probably will need an angiogram.

## 2012-09-06 NOTE — Telephone Encounter (Signed)
I spoke with the pt and made him aware of appointment with Dr Burt Knack.

## 2012-09-06 NOTE — Telephone Encounter (Signed)
Pt scheduled to see Dr Burt Knack on 09/08/12.  The pt's voicemail is I was able to enter our office number for a return call.

## 2012-09-08 ENCOUNTER — Ambulatory Visit: Payer: Medicare HMO | Admitting: Cardiovascular Disease

## 2012-09-09 ENCOUNTER — Encounter: Payer: Self-pay | Admitting: Cardiovascular Disease

## 2012-09-09 ENCOUNTER — Ambulatory Visit (INDEPENDENT_AMBULATORY_CARE_PROVIDER_SITE_OTHER): Payer: Medicare HMO | Admitting: Cardiovascular Disease

## 2012-09-09 VITALS — BP 155/77 | HR 87 | Ht 66.0 in | Wt 198.8 lb

## 2012-09-09 DIAGNOSIS — I70219 Atherosclerosis of native arteries of extremities with intermittent claudication, unspecified extremity: Secondary | ICD-10-CM

## 2012-09-09 NOTE — Patient Instructions (Addendum)
Please call the office at [phone] if you would like to schedule an angiogram.  Your physician recommends that you continue on your current medications as directed. Please refer to the Current Medication list given to you today.  Your physician wants you to follow-up in: 1 YEAR.  You will receive a reminder letter in the mail two months in advance. If you don't receive a letter, please call our office to schedule the follow-up appointment.

## 2012-09-09 NOTE — Progress Notes (Signed)
HPI:  66 year old gentleman presenting for followup evaluation. The patient was initially seen in February 2013 for chest pain. His pain was felt to be atypical and no further testing was done. He has developed problems with recurrent cellulitic infections and there was a suspicion for peripheral arterial disease. He underwent ABIs that were markedly abnormal. His ABI on the right is 0.4 to and on the left it is 0.89. There is a suggestion of significant inflow disease on the right.  The patient reports long-standing difficulty walking. He has right leg tiredness and aching in the calf area when going for long distances or walking up hills. This year he has developed recurrent infections of the right leg. He continues to have some swelling and redness in the right lower leg, but tells me this is greatly improved. Right now his most problematic areas the right knee. He feels like he has gallop in the knee and he is working with his primary care physician to get this investigated and treated. He has no complaints related to the left leg. He denies chest pain or dyspnea at present.  Outpatient Encounter Prescriptions as of 09/09/2012  Medication Sig Dispense Refill  . albuterol (PROVENTIL HFA;VENTOLIN HFA) 108 (90 BASE) MCG/ACT inhaler Inhale 2 puffs into the lungs every 4 (four) hours as needed.      Marland Kitchen allopurinol (ZYLOPRIM) 300 MG tablet Take 300 mg by mouth daily.      Marland Kitchen amLODipine (NORVASC) 10 MG tablet Take 1 tablet (10 mg total) by mouth daily.  90 tablet  3  . aspirin 81 MG EC tablet Take 81 mg by mouth daily.        Marland Kitchen esomeprazole (NEXIUM) 40 MG capsule Take 40 mg by mouth daily before breakfast.      . Fluticasone-Salmeterol (ADVAIR) 250-50 MCG/DOSE AEPB Inhale 1 puff into the lungs 2 (two) times daily.      Marland Kitchen glipiZIDE (GLUCOTROL) 10 MG tablet Take 1 tablet (10 mg total) by mouth every morning.  90 tablet  3  . HYDROcodone-acetaminophen (VICODIN) 5-500 MG per tablet Take 1 tablet by mouth  every 8 (eight) hours as needed for pain. Fill 30 days later  90 tablet  0  . ketoconazole (NIZORAL) 2 % cream Apply topically 2 (two) times daily. To bilateral bottom of the feet and toenails.  15 g  0  . losartan (COZAAR) 50 MG tablet Take 1 tablet (50 mg total) by mouth daily.  90 tablet  3  . metFORMIN (GLUCOPHAGE) 1000 MG tablet Take 1 tablet (1,000 mg total) by mouth 2 (two) times daily with a meal.  180 tablet  3  . metoprolol (LOPRESSOR) 100 MG tablet Take 2 tablets (200 mg total) by mouth 2 (two) times daily.  180 tablet  3  . nitroGLYCERIN (NITROSTAT) 0.4 MG SL tablet Place 0.4 mg under the tongue every 5 (five) minutes as needed.      . pravastatin (PRAVACHOL) 80 MG tablet Take 1 tablet (80 mg total) by mouth every evening.  90 tablet  3  . furosemide (LASIX) 20 MG tablet       . [DISCONTINUED] HYDROcodone-acetaminophen (LORTAB) 10-500 MG per tablet Take 1 tablet by mouth every 8 (eight) hours as needed for pain.  90 tablet  0  . [DISCONTINUED] HYDROcodone-acetaminophen (VICODIN) 5-500 MG per tablet Take 1 tablet by mouth every 8 (eight) hours as needed for pain. Fill 60 days later.  90 tablet  0  . [DISCONTINUED] traMADol (ULTRAM) 50  MG tablet Take 1 tablet (50 mg total) by mouth every 8 (eight) hours as needed for pain.  30 tablet  1    Allergies  Allergen Reactions  . Flexeril (Cyclobenzaprine) Other (See Comments)    Dry mouth and hallucinations.     Past Medical History  Diagnosis Date  . Asthma   . COPD (chronic obstructive pulmonary disease)   . GERD (gastroesophageal reflux disease)   . Hyperlipidemia   . Hypertension   . Substance abuse   . Peripheral neuropathy 07/01/2012  . History of bronchitis 07/01/2012    "I've had it all my life"  . Type II diabetes mellitus 2002    Diagnosed in 2002  . Gouty arthritis 07/01/2012  . Chronic lower back pain 07/01/2012  . Skin growth 07/01/2012    right side of nares; "been on there 20 years"  . Hepatitis 1967    "in jail when I  got it; dr said it was pretty bad; quaranteened X 30d"   ROS: Negative except as per HPI  BP 155/77  Pulse 87  Ht 5\' 6"  (1.676 m)  Wt 90.175 kg (198 lb 12.8 oz)  BMI 32.09 kg/m2  PHYSICAL EXAM: Pt is alert and oriented, NAD HEENT: Large cauliflower like growth over the right nares Neck: JVP - normal, carotids 2+= without bruits Lungs: CTA bilaterally CV: RRR without murmur or gallop Abd: soft, NT, Positive BS, no hepatomegaly Ext: 1+ edema right pretibial and ankle areas with mild redness, no edema on the left. Left DP and PT pulses are 2+, right DP and PT pulses are nonpalpable. The right femoral pulse is palpable and 1+. The left femoral pulse is normal. Skin: warm/dry no rash  EKG:  Normal sinus rhythm 84 beats per minute, within normal limits the  ASSESSMENT AND PLAN: Lower extremity peripheral arterial disease with intermittent claudication and recurrent cellulitis. Think the patient has significant arterial disease contributing to his right leg problems. He does have multiple issues and certainly has right knee pain may be related to gout. However, he clearly has symptoms of intermittent claudication. His ABI is approaching the severe range at 0.4. I think an angiogram is indicated and we discussed the risks, indications, and alternatives to angiography and possible PTA and stenting. The patient would like to delay this for now as he prefers to continue workup and treatment of his right knee pain. He will contact me when he decides to pursue an arteriogram. He remains on aspirin and appropriate medical therapy.  Sherren Mocha 09/09/2012 4:05 PM

## 2012-09-10 ENCOUNTER — Telehealth: Payer: Self-pay | Admitting: Family Medicine

## 2012-09-10 MED ORDER — MELOXICAM 15 MG PO TABS: 15.0000 mg | ORAL_TABLET | Freq: Every day | ORAL | Status: DC

## 2012-09-10 NOTE — Telephone Encounter (Signed)
Called patient.  Normal uric acid. He has not yet gotten R knee x-ray done but will next week. Reports that he is still having joint pain with mild swelling. No redness, warmth or fever.  Suspect DJD vs pain related to peripheral arterial disease. Plan short course of mobic. Will look out for and  f/u x-ray.

## 2012-10-07 ENCOUNTER — Other Ambulatory Visit: Payer: Self-pay | Admitting: Family Medicine

## 2012-10-11 ENCOUNTER — Telehealth: Payer: Self-pay | Admitting: Family Medicine

## 2012-10-11 NOTE — Telephone Encounter (Signed)
LMOM advising pt that pharmacy issue has been resolved.

## 2012-10-11 NOTE — Telephone Encounter (Signed)
Will forward message to Dr. [name] [place] .

## 2012-10-11 NOTE — Telephone Encounter (Signed)
Pt has been calling pharmacy for a week for refill on his Mobic.  They still haven't heard from doctor yet.  [facility] [address] [facility]

## 2012-10-11 NOTE — Telephone Encounter (Signed)
Refill sent. Please advise.

## 2012-10-15 ENCOUNTER — Encounter: Payer: Self-pay | Admitting: Family Medicine

## 2012-10-15 ENCOUNTER — Ambulatory Visit (INDEPENDENT_AMBULATORY_CARE_PROVIDER_SITE_OTHER): Payer: Medicare HMO | Admitting: Family Medicine

## 2012-10-15 VITALS — BP 130/63 | HR 66 | Temp 97.9°F | Ht 66.0 in | Wt 208.1 lb

## 2012-10-15 DIAGNOSIS — H9201 Otalgia, right ear: Secondary | ICD-10-CM | POA: Insufficient documentation

## 2012-10-15 DIAGNOSIS — H9209 Otalgia, unspecified ear: Secondary | ICD-10-CM

## 2012-10-15 NOTE — Progress Notes (Signed)
S: Pt comes in today for SDA for R ear pain.  Patient reports soreness for a few days.  Has been taking Advil for it, which helps some with the pain.  Used a wax removal kit but it didn't help.  Had it cleaned out 6-8 months ago because he had a lot of wax in his ear for decreased hearing.  No runny nose, congestion, or sore throat, but + hoarseness for the past few days. No N/V/D.  No fevers/chills.  Right now isn't having much pain, but was 5-6/10 last night- was hurting off and on all night waking him up.  No pain with moving ear.    ROS: Per HPI  History  Smoking status  . Former Smoker -- 2.0 packs/day for 40 years  . Types: Cigarettes  . Quit date: 01/13/2002  Smokeless tobacco  . Current User  . Types: Chew    O:  Filed Vitals:   10/15/12 1010  BP: 130/63  Pulse: 66  Temp: 97.9 F (36.6 C)    Gen: NAD HEENT: MMM, no pharyngeal erythema or exudate, nose with Rhinophyma and large 4cm mass on right nostril, no cervical LAD, L TM normal, R TM initially 100% occluded by cerumen, after cerumen removal, unchanged (umable to get any wax out with irrigation or curette)  CV: RRR, no murmur Pulm: CTA bilat, no wheezes or crackles   A/P: 66 y.o. male p/w R ear pain, likely from cerumen impaction -See problem list -f/u in 5 days

## 2012-10-15 NOTE — Assessment & Plan Note (Signed)
Likely due to cerumen impaction, although unable to clean ears today.  Recommended mineral oil and hydrogen peroxide to soften cerumen and then f/u in 5-7 days to repeat irrigation.  Without fever, unlikely to be infection, although would have low threshold to treat for infection if he has fever and worsening pain since TM could not be visualized.

## 2012-10-15 NOTE — Patient Instructions (Addendum)
It was nice to meet you today.  Unfortunately, we weren't able to get the wax out of your ear.  Use peroxide at home to help soften the ear wax-- use 1/2 peroxide, 1/2 water and put 4-5 drops in your ear 2 times per day for the next 4-5 days.  Then, come back so we can try to get the wax out if you are still having pain.   Cerumen Plug A cerumen plug is having too much wax in your ear canal. The outer ear canal is lined with hairs and glands that secrete wax. This wax is called cerumen. This protects the ear canal. It also helps prevent material from entering the ear. Too much wax can cause a feeling of fullness in the ears, decreased hearing, ringing in the ears, or an earache. Sometimes your caregiver will remove a cerumen plug with an instrument called a curette. Or he/she may flush the ear canal with warm water from a syringe to remove the wax. You may simply be sent home to follow the home care instructions below for wax removal. Generally ear wax does not have to be removed unless it is causing a problem such as one of those listed above. When too much wax is causing a problem, the following are a few home remedies which can be used to help this problem. HOME CARE INSTRUCTIONS   Put a couple drops of glycerin, baby oil, or mineral oil in the ear a couple times of day. Do this every day for several days. After putting the drops in, you will need to lay with the affected ear pointing up for a couple minutes. This allows the drops to remain in the canal and run down to the area of wax blockage. This will soften the wax plug. It may also make your hearing worse as the wax softens and blocks the canal even more.  After a couple days, you may gently flush the ear canal with warm water from a syringe. Do this by pulling your ear up and back with your head tilted slightly forward and towards a pan to catch the water. This is most easily done with a helper. You can also accomplish the same thing by  letting the shower beat into your ear canal to wash the wax out. Sometimes this will not be immediately successful. You will have to return to the first step of using the oil to further soften the wax. Then resume washing the ear canal out with a syringe or shower.  Following removal of the wax, put ten to twenty drops of rubbing alcohol into the outer ears. This will dry the canal and prevent an infection.  Do not irrigate or wash out your ears if you have had a perforated ear drum or mastoid surgery. SEEK IMMEDIATE MEDICAL CARE IF:   You are unsuccessful with the above instructions for home care.  You develop ear pain or drainage from the ear. MAKE SURE YOU:   Understand these instructions.  Will watch your condition.  Will get help right away if you are not doing well or get worse. Document Released: 07/08/2001 Document Revised: 01/05/2012 Document Reviewed: 10/04/2008 [facility] Patient Information 2013 [place].

## 2012-11-02 ENCOUNTER — Other Ambulatory Visit: Payer: Self-pay | Admitting: Family Medicine

## 2012-11-19 ENCOUNTER — Encounter: Payer: Self-pay | Admitting: Family Medicine

## 2012-11-19 ENCOUNTER — Ambulatory Visit (INDEPENDENT_AMBULATORY_CARE_PROVIDER_SITE_OTHER): Payer: Medicare HMO | Admitting: Family Medicine

## 2012-11-19 VITALS — BP 148/76 | HR 69 | Temp 98.7°F | Ht 66.0 in | Wt 212.0 lb

## 2012-11-19 DIAGNOSIS — L711 Rhinophyma: Secondary | ICD-10-CM | POA: Insufficient documentation

## 2012-11-19 DIAGNOSIS — M25569 Pain in unspecified knee: Secondary | ICD-10-CM

## 2012-11-19 DIAGNOSIS — M25561 Pain in right knee: Secondary | ICD-10-CM | POA: Insufficient documentation

## 2012-11-19 DIAGNOSIS — E119 Type 2 diabetes mellitus without complications: Secondary | ICD-10-CM

## 2012-11-19 DIAGNOSIS — L719 Rosacea, unspecified: Secondary | ICD-10-CM

## 2012-11-19 DIAGNOSIS — M545 Low back pain: Secondary | ICD-10-CM

## 2012-11-19 DIAGNOSIS — Z23 Encounter for immunization: Secondary | ICD-10-CM

## 2012-11-19 DIAGNOSIS — IMO0002 Reserved for concepts with insufficient information to code with codable children: Secondary | ICD-10-CM

## 2012-11-19 DIAGNOSIS — E118 Type 2 diabetes mellitus with unspecified complications: Secondary | ICD-10-CM

## 2012-11-19 MED ORDER — HYDROCODONE-ACETAMINOPHEN 10-325 MG PO TABS: 1.0000 | ORAL_TABLET | Freq: Three times a day (TID) | ORAL | Status: DC | PRN

## 2012-11-19 NOTE — Patient Instructions (Addendum)
Leslie [name],  Thank you for coming in to see me today.  1. 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 Dermatology referral placed.   2. For  R knee pain: Stop mobic.  3. Chronic pain: vicodin 10/325 three times daily, 90 day supply.  F/u in 3 months.

## 2012-11-19 NOTE — Assessment & Plan Note (Signed)
A: R knee pain. Improving. Patient never went for x-ray. He declines corticosteroid injection P: Stop mobic-not for chronic use in the setting of HTN, DM.  Increase activity as tolerated.  Back to swimming.

## 2012-11-19 NOTE — Assessment & Plan Note (Signed)
Chronic low back pain managed on vicodin. Refilled vicodin 10/325 TID 90 tab, 3 scripts for 90 day supply.

## 2012-11-19 NOTE — Progress Notes (Signed)
Subjective:     Patient ID: Leslie [name], male   DOB: [birth year], 67 y.o.   MRN: [id number]  HPI 67 yo M presents for f/u visit to discuss the following:  1. Medication issue: vicodin is off. He takes 10/500 TID for chronic low back pain. He got 5/500 with only 30 pills. He has 12 pills left.   2. R nose lesion: present x 20 years. Growing and now has areas of necrosis/open areas. Treating with triple antibiotic ointment. Interested in having lesion removed.   3. DM2: compliant with medications. No LE wounds or redness. Continues to drink alcohol.   4. R knee pain: anterior knee pain. Improving. Minimal swelling. Taking mobic. Has not returned to swimming or riding scooter. Pain with knee flexion. No locking.   Review of Systems As per HPI    Objective:   Physical Exam BP 148/76  Pulse 69  Temp 98.7 F (37.1 C) (Oral)  Ht 5\' 6"  (1.676 m)  Wt 212 lb (96.163 kg)  BMI 34.22 kg/m2 General appearance: alert, cooperative, no distress and moderately obese Extremities:  R knee: able to extend fully. Non TTP. Minimal swelling. No erythema. No joint line tenderness.  Skin: diffuse skin thickening on face with large bulbous mass on L nares. Areas of necrosis superiroly.      Assessment and Plan:

## 2012-11-19 NOTE — Assessment & Plan Note (Signed)
A: worsening rhinophyma x 20 years. Now with new growths and areas of necrosis. P: Derm referral for removal.

## 2012-11-19 NOTE — Assessment & Plan Note (Signed)
A: A1c still at goal of about 7. Meds: compliant P: continue current regimen

## 2013-03-01 ENCOUNTER — Other Ambulatory Visit: Payer: Self-pay | Admitting: Family Medicine

## 2013-03-01 DIAGNOSIS — R0989 Other specified symptoms and signs involving the circulatory and respiratory systems: Secondary | ICD-10-CM

## 2013-03-07 ENCOUNTER — Ambulatory Visit
Admission: RE | Admit: 2013-03-07 | Discharge: 2013-03-07 | Disposition: A | Discharge status: Home / Self Care | Payer: No Typology Code available for payment source | Source: Ambulatory Visit | Attending: Family Medicine | Admitting: Family Medicine

## 2013-03-07 DIAGNOSIS — R0989 Other specified symptoms and signs involving the circulatory and respiratory systems: Secondary | ICD-10-CM

## 2013-03-15 ENCOUNTER — Other Ambulatory Visit: Payer: Self-pay | Admitting: Otolaryngology

## 2013-04-05 ENCOUNTER — Encounter: Payer: Self-pay | Admitting: Physician Assistant

## 2013-04-05 ENCOUNTER — Ambulatory Visit (INDEPENDENT_AMBULATORY_CARE_PROVIDER_SITE_OTHER): Payer: Medicare Other | Admitting: Physician Assistant

## 2013-04-05 ENCOUNTER — Telehealth: Payer: Self-pay | Admitting: *Deleted

## 2013-04-05 ENCOUNTER — Encounter: Payer: Self-pay | Admitting: *Deleted

## 2013-04-05 VITALS — BP 140/58 | HR 69 | Ht 66.0 in | Wt 223.8 lb

## 2013-04-05 DIAGNOSIS — R05 Cough: Secondary | ICD-10-CM

## 2013-04-05 DIAGNOSIS — I10 Essential (primary) hypertension: Secondary | ICD-10-CM

## 2013-04-05 DIAGNOSIS — Z01812 Encounter for preprocedural laboratory examination: Secondary | ICD-10-CM

## 2013-04-05 DIAGNOSIS — I739 Peripheral vascular disease, unspecified: Secondary | ICD-10-CM

## 2013-04-05 DIAGNOSIS — E785 Hyperlipidemia, unspecified: Secondary | ICD-10-CM

## 2013-04-05 LAB — BASIC METABOLIC PANEL
BUN: 20 mg/dL (ref 6–23)
Creatinine, Ser: 1.1 mg/dL (ref 0.4–1.5)
GFR: 73.15 mL/min (ref >60.00)
Glucose, Bld: 166 mg/dL — ABNORMAL HIGH (ref 70–99)
Potassium: 4.3 mEq/L (ref 3.5–5.1)

## 2013-04-05 LAB — CBC WITH DIFFERENTIAL/PLATELET
Basophils Absolute: 0.1 10*3/uL (ref 0.0–0.1)
HCT: 37.8 % — ABNORMAL LOW (ref 39.0–52.0)
Lymphs Abs: 4.7 10*3/uL — ABNORMAL HIGH (ref 0.7–4.0)
MCV: 91.2 fl (ref 78.0–100.0)
Monocytes Absolute: 1 10*3/uL (ref 0.1–1.0)
Monocytes Relative: 7.9 % (ref 3.0–12.0)
Neutrophils Relative %: 52.5 % (ref 43.0–77.0)
Platelets: 162 10*3/uL (ref 150.0–400.0)
RDW: 13.9 % (ref 11.5–14.6)
WBC: 12.5 10*3/uL — ABNORMAL HIGH (ref 4.5–10.5)

## 2013-04-05 LAB — PROTIME-INR
INR: 1 ratio (ref 0.8–1.0)
Prothrombin Time: 10.8 s (ref 10.2–12.4)

## 2013-04-05 NOTE — Telephone Encounter (Signed)
pt notified about lab results and will go tomorrow 04/06/13  to [facility] for CXR dx cough, eleavted WBC. pt denies dysuria, fever, does state he has cough and a chest cold. pt advised to f/u PCP about DM, pt verbalized understanding to all instructions

## 2013-04-05 NOTE — Patient Instructions (Addendum)
YOU HAVE BEEN SCHEDULED FOR A LOWER EXTREMITY PERIPHERAL ANGIOGRAM WITH DR. Burt Knack ON 04/18/13 @ 9 AM; DX PAD, CLAUDICATION  SEE YOUR PROCEDURE LETTER INSTRUCTIONS   NO MEDICATION CHANGES WERE MADE TODAY

## 2013-04-05 NOTE — Progress Notes (Signed)
Leslie [name]. [address], [address] [place], [place]  [address] Phone: [phone] Fax:  [phone]  Date:  04/05/2013   ID:  Leslie [name], DOB 1946/08/27, MRN [id number]  PCP:  Dr. Marvel Plan at [facility] Cardiologist:  Dr. Sherren Mocha     History of Present Illness: Leslie [name] is a 67 y.o. male who returns for f/u on PAD.  He has a hx of peripheral arterial disease, HTN, HL, DM2, COPD, GERD. He was initially seen by Dr. Burt Knack in 11/2011 for chest pain. This was felt to be atypical and no further testing was performed. He's had problems in the past with recurrent cellulitic infections. ABIs last year were abnormal with 0.4 on the right and 0.89 on the left and there was suggestion of significant inflow disease on the right. Last seen by Dr. Burt Knack 08/2012. He was felt to have symptoms of intermittent claudication. Peripheral angiogram with possible PTA and stenting was recommended.  It does not appear the patient ever had this procedure performed.  He was apparently waiting for knee pain to resolve.  Patient returns to the office today and states he thought he was getting his angiogram today.  He continues to have LE pain with ambulation.  He also has significant numbness from his knees down.  He has significant neuropathy.  He develops pain with walking short distances (~ 20 yards).  This is worse going up hill.  No rest pain.  No ulcers or non-healing wounds.  He denies chest pain, dyspnea, syncope, orthopnea, PND.  He has chronic LE edema.   Recent ABIs 02/2013:  R 0.73, R TBI 0.47, L 0.91, L TBI 0.46 (done at [facility]).  Labs (9/13):    Hgb 12  Labs (10/13):  K 4.3, Cr 0.73   Wt Readings from Last 3 Encounters:  04/05/13 223 lb 12.8 oz (101.515 kg)  11/19/12 212 lb (96.163 kg)  10/15/12 208 lb 1.6 oz (94.394 kg)     Past Medical History  Diagnosis Date  . Asthma   . COPD (chronic obstructive pulmonary disease)   . GERD (gastroesophageal reflux disease)   .  Hyperlipidemia   . Hypertension   . Substance abuse   . Peripheral neuropathy 07/01/2012  . History of bronchitis 07/01/2012    "I've had it all my life"  . Type II diabetes mellitus 2002    Diagnosed in 2002  . Gouty arthritis 07/01/2012  . Chronic lower back pain 07/01/2012  . Skin growth 07/01/2012    right side of nares; "been on there 20 years"  . Hepatitis 1967    "in jail when I got it; dr said it was pretty bad; quaranteened X 30d"  . Cellulitis of right foot 06/30/2012    Current Outpatient Prescriptions  Medication Sig Dispense Refill  . albuterol (PROVENTIL HFA;VENTOLIN HFA) 108 (90 BASE) MCG/ACT inhaler Inhale 2 puffs into the lungs every 4 (four) hours as needed.      Marland Kitchen allopurinol (ZYLOPRIM) 300 MG tablet take 1 tablet by mouth once daily  30 tablet  0  . amLODipine (NORVASC) 10 MG tablet Take 1 tablet (10 mg total) by mouth daily.  90 tablet  3  . aspirin 81 MG EC tablet Take 81 mg by mouth daily.        . Fluticasone-Salmeterol (ADVAIR) 250-50 MCG/DOSE AEPB Inhale 1 puff into the lungs 2 (two) times daily.      . furosemide (LASIX) 20 MG tablet take 1 tablet  by mouth once daily  30 tablet  2  . glipiZIDE (GLUCOTROL) 10 MG tablet Take 1 tablet (10 mg total) by mouth every morning.  90 tablet  3  . HYDROcodone-acetaminophen (NORCO) 10-325 MG per tablet Take 1 tablet by mouth every 8 (eight) hours as needed for pain.  90 tablet  0  . HYDROcodone-acetaminophen (NORCO) 10-325 MG per tablet Take 1 tablet by mouth every 8 (eight) hours as needed for pain.  90 tablet  0  . HYDROcodone-acetaminophen (NORCO) 10-325 MG per tablet Take 1 tablet by mouth every 8 (eight) hours as needed for pain.  90 tablet  0  . ketoconazole (NIZORAL) 2 % cream Apply topically 2 (two) times daily. To bilateral bottom of the feet and toenails.  15 g  0  . losartan (COZAAR) 50 MG tablet Take 1 tablet (50 mg total) by mouth daily.  90 tablet  3  . metFORMIN (GLUCOPHAGE) 1000 MG tablet Take 1 tablet (1,000 mg  total) by mouth 2 (two) times daily with a meal.  180 tablet  3  . metoprolol (LOPRESSOR) 100 MG tablet Take 2 tablets (200 mg total) by mouth 2 (two) times daily.  180 tablet  3  . NEXIUM 40 MG capsule take 1 capsule by mouth once daily  30 each  2  . nitroGLYCERIN (NITROSTAT) 0.4 MG SL tablet Place 0.4 mg under the tongue every 5 (five) minutes as needed.      . pravastatin (PRAVACHOL) 80 MG tablet Take 1 tablet (80 mg total) by mouth every evening.  90 tablet  3   No current facility-administered medications for this visit.    Allergies:    Allergies  Allergen Reactions  . Flexeril (Cyclobenzaprine) Other (See Comments)    Dry mouth and hallucinations.     Social History:  The patient  reports that he quit smoking about 11 years ago. His smoking use included Cigarettes. He has a 80 pack-year smoking history. His smokeless tobacco use includes Chew. He reports that he drinks about 18.0 ounces of alcohol per week. He reports that he uses illicit drugs (Marijuana).    ROS:  Please see the history of present illness.   He has had sigificant URI symptoms recently.   He underwent recent nasal skin growth removal.  All other systems reviewed and negative.   PHYSICAL EXAM: VS:  BP 140/58  Pulse 69  Ht 5\' 6"  (1.676 m)  Wt 223 lb 12.8 oz (101.515 kg)  BMI 36.14 kg/m2  SpO2 95% Well nourished, well developed, in no acute distress HEENT: normal Neck: no JVD Cardiac:  normal S1, S2; RRR; no murmur Lungs:  clear to auscultation bilaterally, no wheezing, rhonchi or rales Abd: soft, nontender, no hepatomegaly Ext: trace to 1+ bilateral LE edema Vascular:  DP/PT pulses not palpable bilat Skin: warm and dry Neuro:  CNs 2-12 intact, no focal abnormalities noted  EKG:  NSR, HR 67, no acute changes     ASSESSMENT AND PLAN:  1. PAD:  Patient continues to have symptoms of intermittent claudication with walking short distances that is lifestyle limiting.  He also has significant limitations  from his neuropathy.  In any event, his ABIs have been abnormal (worse on the R).  Recent ABIs with some improvement (? If this is related to different technique).  Given his continued symptoms and abnormal ABIs, I have offered him the option of proceeding with a distal angiogram with an eye towards PTA/stenting.  Risk and benefits have been  reviewed with the patient. Risks include, but are not limited to, bleeding, infection, renal failure and distal embolization.  He understands the risks and would like to proceed.  D/w Dr. Sherren Mocha who agreed.  He will be set up with Dr. Burt Knack in the next few weeks. 2. Hypertension:  Controlled.  Continue current therapy.  3. Hyperlipidemia:  Continue statin. 4. Diabetes:  Managed by PCP. 5. URI:  He will follow up with PCP if no better. 6. Disposition:  Proceed with angiogram with Dr. Burt Knack in the next few weeks.   Signed, Richardson Dopp, PA-C  04/05/2013 11:38 AM

## 2013-04-05 NOTE — Telephone Encounter (Signed)
Message copied by Michae Kava on Tue Apr 05, 2013  5:06 PM ------      Message from: [place], [place] T      Created: Tue Apr 05, 2013  4:55 PM       Potassium and creatinine okay for procedure      Glucose elevated-followup with PCP for diabetes      Hemoglobin stable      White count elevated - as the patient if he is having fevers, cough, dysuria or taking prednisone      Richardson Dopp, PA-C        04/05/2013 4:55 PM ------

## 2013-04-06 ENCOUNTER — Other Ambulatory Visit: Payer: Medicare Other

## 2013-04-08 ENCOUNTER — Telehealth: Payer: Self-pay | Admitting: *Deleted

## 2013-04-08 NOTE — Telephone Encounter (Signed)
lmptcb to find out why he did not get his CXR done as stated in previous conversation the other day I had w/pt.

## 2013-04-08 NOTE — Telephone Encounter (Signed)
Message copied by Michae Kava on Fri Apr 08, 2013  2:18 PM ------      Message from: [place], [place] T      Created: Fri Apr 08, 2013  1:31 PM      Regarding: CXR         Has he done CXR?      Richardson Dopp, PA-C        04/08/2013 1:31 PM             ----- Message -----         From: [facility] Interface         Sent: 04/05/2013   3:35 PM           To: Liliane Shi, PA-C                   ------

## 2013-04-11 NOTE — Telephone Encounter (Signed)
lmptcb x 2 about CXR, looks like pt did not have CXR done @ [address] as instructed at appt with Brynda Rim. PA.

## 2013-04-15 ENCOUNTER — Telehealth: Payer: Self-pay | Admitting: *Deleted

## 2013-04-15 ENCOUNTER — Encounter: Payer: Self-pay | Admitting: *Deleted

## 2013-04-15 NOTE — Telephone Encounter (Signed)
Message copied by Michae Kava on Fri Apr 15, 2013  2:36 PM ------      Message from: [place], [place] T      Created: Fri Apr 08, 2013  1:31 PM      Regarding: CXR         Has he done CXR?      Richardson Dopp, PA-C        04/08/2013 1:31 PM             ----- Message -----         From: [facility] Interface         Sent: 04/05/2013   3:35 PM           To: Liliane Shi, PA-C                   ------

## 2013-04-15 NOTE — Telephone Encounter (Signed)
lmptcb x 3 to see why he did not have his CXR done. I will send out a letter today

## 2013-04-15 NOTE — Telephone Encounter (Signed)
lmptcb x 3 to se why pt did not have cxr. I sent out a letter today to ptcb

## 2013-04-18 ENCOUNTER — Encounter (HOSPITAL_COMMUNITY)
Admission: RE | Disposition: A | Discharge status: Home / Self Care | Payer: Self-pay | Source: Ambulatory Visit | Attending: Cardiovascular Disease

## 2013-04-18 ENCOUNTER — Ambulatory Visit (HOSPITAL_COMMUNITY)
Admission: RE | Admit: 2013-04-18 | Discharge: 2013-04-18 | Disposition: A | Discharge status: Home / Self Care | Payer: Medicare (Managed Care) | Source: Ambulatory Visit | Attending: Cardiovascular Disease | Admitting: Cardiovascular Disease

## 2013-04-18 DIAGNOSIS — E119 Type 2 diabetes mellitus without complications: Secondary | ICD-10-CM | POA: Insufficient documentation

## 2013-04-18 DIAGNOSIS — M109 Gout, unspecified: Secondary | ICD-10-CM | POA: Insufficient documentation

## 2013-04-18 DIAGNOSIS — I10 Essential (primary) hypertension: Secondary | ICD-10-CM | POA: Insufficient documentation

## 2013-04-18 DIAGNOSIS — J449 Chronic obstructive pulmonary disease, unspecified: Secondary | ICD-10-CM | POA: Insufficient documentation

## 2013-04-18 DIAGNOSIS — Z7982 Long term (current) use of aspirin: Secondary | ICD-10-CM | POA: Insufficient documentation

## 2013-04-18 DIAGNOSIS — Z79899 Other long term (current) drug therapy: Secondary | ICD-10-CM | POA: Insufficient documentation

## 2013-04-18 DIAGNOSIS — I739 Peripheral vascular disease, unspecified: Secondary | ICD-10-CM

## 2013-04-18 DIAGNOSIS — K219 Gastro-esophageal reflux disease without esophagitis: Secondary | ICD-10-CM | POA: Insufficient documentation

## 2013-04-18 DIAGNOSIS — G609 Hereditary and idiopathic neuropathy, unspecified: Secondary | ICD-10-CM | POA: Insufficient documentation

## 2013-04-18 DIAGNOSIS — J4489 Other specified chronic obstructive pulmonary disease: Secondary | ICD-10-CM | POA: Insufficient documentation

## 2013-04-18 DIAGNOSIS — I70219 Atherosclerosis of native arteries of extremities with intermittent claudication, unspecified extremity: Secondary | ICD-10-CM

## 2013-04-18 DIAGNOSIS — Z87891 Personal history of nicotine dependence: Secondary | ICD-10-CM | POA: Insufficient documentation

## 2013-04-18 DIAGNOSIS — E785 Hyperlipidemia, unspecified: Secondary | ICD-10-CM | POA: Insufficient documentation

## 2013-04-18 DIAGNOSIS — F1210 Cannabis abuse, uncomplicated: Secondary | ICD-10-CM | POA: Insufficient documentation

## 2013-04-18 DIAGNOSIS — Z8619 Personal history of other infectious and parasitic diseases: Secondary | ICD-10-CM | POA: Insufficient documentation

## 2013-04-18 DIAGNOSIS — IMO0002 Reserved for concepts with insufficient information to code with codable children: Secondary | ICD-10-CM | POA: Insufficient documentation

## 2013-04-18 HISTORY — PX: LOWER EXTREMITY ANGIOGRAM: SHX5508

## 2013-04-18 LAB — GLUCOSE, CAPILLARY
Glucose-Capillary: 195 mg/dL — ABNORMAL HIGH (ref 70–99)
Glucose-Capillary: 146 mg/dL — ABNORMAL HIGH (ref 70–99)

## 2013-04-18 SURGERY — ANGIOGRAM, LOWER EXTREMITY
Anesthesia: LOCAL

## 2013-04-18 MED ORDER — ONDANSETRON HCL 4 MG/2ML IJ SOLN: 4.0000 mg | Freq: Four times a day (QID) | INTRAMUSCULAR | Status: DC | PRN

## 2013-04-18 MED ORDER — SODIUM CHLORIDE 0.9 % IV SOLN: 250.0000 mL | INTRAVENOUS | Status: DC | PRN

## 2013-04-18 MED ORDER — ASPIRIN 81 MG PO CHEW
324.0000 mg | CHEWABLE_TABLET | ORAL | Status: AC
  Administered 2013-04-18: 324 mg via ORAL

## 2013-04-18 MED ORDER — MIDAZOLAM HCL 2 MG/2ML IJ SOLN
INTRAMUSCULAR | Status: AC
  Filled 2013-04-18: qty 2

## 2013-04-18 MED ORDER — ASPIRIN 81 MG PO CHEW
CHEWABLE_TABLET | ORAL | Status: AC
  Filled 2013-04-18: qty 4

## 2013-04-18 MED ORDER — SODIUM CHLORIDE 0.9 % IV SOLN
INTRAVENOUS | Status: DC
  Administered 2013-04-18: 07:00:00 via INTRAVENOUS

## 2013-04-18 MED ORDER — LIDOCAINE HCL (PF) 1 % IJ SOLN
INTRAMUSCULAR | Status: AC
  Filled 2013-04-18: qty 30

## 2013-04-18 MED ORDER — SODIUM CHLORIDE 0.9 % IJ SOLN: 3.0000 mL | Freq: Two times a day (BID) | INTRAMUSCULAR | Status: DC

## 2013-04-18 MED ORDER — SODIUM CHLORIDE 0.9 % IJ SOLN: 3.0000 mL | INTRAMUSCULAR | Status: DC | PRN

## 2013-04-18 MED ORDER — FENTANYL CITRATE 0.05 MG/ML IJ SOLN
INTRAMUSCULAR | Status: AC
  Filled 2013-04-18: qty 2

## 2013-04-18 MED ORDER — SODIUM CHLORIDE 0.9 % IV SOLN: 1.0000 mL/kg/h | INTRAVENOUS | Status: DC

## 2013-04-18 MED ORDER — HEPARIN (PORCINE) IN NACL 2-0.9 UNIT/ML-% IJ SOLN
INTRAMUSCULAR | Status: AC
  Filled 2013-04-18: qty 1000

## 2013-04-18 MED ORDER — ACETAMINOPHEN 325 MG PO TABS: 650.0000 mg | ORAL_TABLET | ORAL | Status: DC | PRN

## 2013-04-18 NOTE — CV Procedure (Signed)
   Cardiac Catheterization Procedure Note  Name: Leslie [name] MRN: [id number] DOB: [birth year]  Procedure: Abdominal aortic angiogram, right external iliac artery angiogram with runoff (contralateral), left external iliac angiogram with runoff (ipsilateral)  Indication: Intermittent claudication, recurrent infections of the right leg, abnormal ABI of 0.4 on the right   Procedural details: Both groins were prepped, draped, and anesthetized with 1% lidocaine. Using modified Seldinger technique, a 5 French sheath was introduced into the left femoral artery. I attempted to use ultrasound but was unable to visualize the artery. I used fluoroscopy for guidance as the patient's femoral artery was heavily calcified. The artery was punctured with the front wall stick. A pigtail catheter was advanced into the suprarenal abdominal aorta for digital subtraction angiography was performed. The tail catheter was pulled back into the distal aorta just above the iliac bifurcation and another subtracted image was obtained. Using an angled Glidewire and a crossover catheter, the right external iliac artery was accessed. Angiography was performed with a runoff to the right foot. The catheter was pulled back and left external iliac angiography was performed with a runoff to the left foot. The patient tolerated the procedure well. There were no immediate procedural complications. The patient was transferred to the post catheterization recovery area for further monitoring.  Procedural Findings: Hemodynamics:  AO 164/75 with a mean of 107   Abdominal aorta: The abdominal aorta is irregular but widely patent. There are single renal arteries bilaterally without significant stenoses.  Right leg: The common iliac artery is moderately diseased with a 50% stenosis. Of note, there was no significant pullback gradient across this lesion. There is moderate associated calcification. The external iliac and common femoral arteries  are patent. The internal iliac artery fills late. The deep femoral artery is patent. The SFA is patent with diffuse disease throughout. There is heavy calcification. There is extensive disease but no areas of high-grade stenosis are noted. The anterior tibial has a 75% ostial stenosis. The tibioperoneal trunk is long and divides into the peroneal and posterior tibial arteries without significant disease in either vessel. There is three-vessel runoff to the right foot.  Left leg: The common, internal, and external iliac arteries are all patent. There is moderate to heavy calcification noted. There is diffuse irregularity but no significant stenoses are noted. The common femoral artery has a mild eccentric plaque. The deep and superficial femoral arteries are patent. The SFA is diffuse marked irregularity and calcification but there are no areas of high-grade stenosis noted. The posterior tibial is occluded and reconstitutes just above the ankle. The anterior tibial and peroneal arteries are patent with diffuse disease noted. There is 2 vessel runoff to the foot. The dorsalis pedis artery is severely diseased.   Final Conclusions:   1. 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  Recommendations: Medical therapy is indicated.  Sherren Mocha 04/18/2013, 9:36 AM

## 2013-04-18 NOTE — Interval H&P Note (Signed)
History and Physical Interval Note:  04/18/2013 8:32 AM  Leslie [name]  has presented today for surgery, with the diagnosis of pad/claudication  The various methods of treatment have been discussed with the patient and family. After consideration of risks, benefits and other options for treatment, the patient has consented to  Procedure(s): LOWER EXTREMITY ANGIOGRAM (N/A) as a surgical intervention .  The patient's history has been reviewed, patient examined, no change in status, stable for surgery.  I have reviewed the patient's chart and labs.  Questions were answered to the patient's satisfaction.    Pt seen and evaluated. He has bilateral leg pain, but ABI's suggest severe disease on the right. Has had numerous infections on the right leg. Plan for angiography via left femoral approach. Risks, indications, and alternatives reviewed with patient who agrees to proceed.  Leslie [name]

## 2013-04-18 NOTE — H&P (View-Only) (Signed)
[facility] [address], [place]  [address] Phone: [phone] Fax:  [phone]  Date:  04/05/2013   ID:  Leslie [name], DOB 1946/01/07, MRN [id number]  PCP:  Dr. Marvel Plan at [facility] Cardiologist:  Dr. Sherren Mocha     History of Present Illness: Leslie [name] is a 67 y.o. male who returns for f/u on PAD.  He has a hx of peripheral arterial disease, HTN, HL, DM2, COPD, GERD. He was initially seen by Dr. Burt Knack in 11/2011 for chest pain. This was felt to be atypical and no further testing was performed. He's had problems in the past with recurrent cellulitic infections. ABIs last year were abnormal with 0.4 on the right and 0.89 on the left and there was suggestion of significant inflow disease on the right. Last seen by Dr. Burt Knack 08/2012. He was felt to have symptoms of intermittent claudication. Peripheral angiogram with possible PTA and stenting was recommended.  It does not appear the patient ever had this procedure performed.  He was apparently waiting for knee pain to resolve.  Patient returns to the office today and states he thought he was getting his angiogram today.  He continues to have LE pain with ambulation.  He also has significant numbness from his knees down.  He has significant neuropathy.  He develops pain with walking short distances (~ 20 yards).  This is worse going up hill.  No rest pain.  No ulcers or non-healing wounds.  He denies chest pain, dyspnea, syncope, orthopnea, PND.  He has chronic LE edema.   Recent ABIs 02/2013:  R 0.73, R TBI 0.47, L 0.91, L TBI 0.46 (done at [facility]).  Labs (9/13):    Hgb 12  Labs (10/13):  K 4.3, Cr 0.73   Wt Readings from Last 3 Encounters:  04/05/13 223 lb 12.8 oz (101.515 kg)  11/19/12 212 lb (96.163 kg)  10/15/12 208 lb 1.6 oz (94.394 kg)     Past Medical History  Diagnosis Date  . Asthma   . COPD (chronic obstructive pulmonary disease)   . GERD (gastroesophageal reflux disease)   .  Hyperlipidemia   . Hypertension   . Substance abuse   . Peripheral neuropathy 07/01/2012  . History of bronchitis 07/01/2012    "I've had it all my life"  . Type II diabetes mellitus 2002    Diagnosed in 2002  . Gouty arthritis 07/01/2012  . Chronic lower back pain 07/01/2012  . Skin growth 07/01/2012    right side of nares; "been on there 20 years"  . Hepatitis 1967    "in jail when I got it; dr said it was pretty bad; quaranteened X 30d"  . Cellulitis of right foot 06/30/2012    Current Outpatient Prescriptions  Medication Sig Dispense Refill  . albuterol (PROVENTIL HFA;VENTOLIN HFA) 108 (90 BASE) MCG/ACT inhaler Inhale 2 puffs into the lungs every 4 (four) hours as needed.      Marland Kitchen allopurinol (ZYLOPRIM) 300 MG tablet take 1 tablet by mouth once daily  30 tablet  0  . amLODipine (NORVASC) 10 MG tablet Take 1 tablet (10 mg total) by mouth daily.  90 tablet  3  . aspirin 81 MG EC tablet Take 81 mg by mouth daily.        . Fluticasone-Salmeterol (ADVAIR) 250-50 MCG/DOSE AEPB Inhale 1 puff into the lungs 2 (two) times daily.      . furosemide (LASIX) 20 MG tablet take 1 tablet  by mouth once daily  30 tablet  2  . glipiZIDE (GLUCOTROL) 10 MG tablet Take 1 tablet (10 mg total) by mouth every morning.  90 tablet  3  . HYDROcodone-acetaminophen (NORCO) 10-325 MG per tablet Take 1 tablet by mouth every 8 (eight) hours as needed for pain.  90 tablet  0  . HYDROcodone-acetaminophen (NORCO) 10-325 MG per tablet Take 1 tablet by mouth every 8 (eight) hours as needed for pain.  90 tablet  0  . HYDROcodone-acetaminophen (NORCO) 10-325 MG per tablet Take 1 tablet by mouth every 8 (eight) hours as needed for pain.  90 tablet  0  . ketoconazole (NIZORAL) 2 % cream Apply topically 2 (two) times daily. To bilateral bottom of the feet and toenails.  15 g  0  . losartan (COZAAR) 50 MG tablet Take 1 tablet (50 mg total) by mouth daily.  90 tablet  3  . metFORMIN (GLUCOPHAGE) 1000 MG tablet Take 1 tablet (1,000 mg  total) by mouth 2 (two) times daily with a meal.  180 tablet  3  . metoprolol (LOPRESSOR) 100 MG tablet Take 2 tablets (200 mg total) by mouth 2 (two) times daily.  180 tablet  3  . NEXIUM 40 MG capsule take 1 capsule by mouth once daily  30 each  2  . nitroGLYCERIN (NITROSTAT) 0.4 MG SL tablet Place 0.4 mg under the tongue every 5 (five) minutes as needed.      . pravastatin (PRAVACHOL) 80 MG tablet Take 1 tablet (80 mg total) by mouth every evening.  90 tablet  3   No current facility-administered medications for this visit.    Allergies:    Allergies  Allergen Reactions  . Flexeril (Cyclobenzaprine) Other (See Comments)    Dry mouth and hallucinations.     Social History:  The patient  reports that he quit smoking about 11 years ago. His smoking use included Cigarettes. He has a 80 pack-year smoking history. His smokeless tobacco use includes Chew. He reports that he drinks about 18.0 ounces of alcohol per week. He reports that he uses illicit drugs (Marijuana).    ROS:  Please see the history of present illness.   He has had sigificant URI symptoms recently.   He underwent recent nasal skin growth removal.  All other systems reviewed and negative.   PHYSICAL EXAM: VS:  BP 140/58  Pulse 69  Ht 5\' 6"  (1.676 m)  Wt 223 lb 12.8 oz (101.515 kg)  BMI 36.14 kg/m2  SpO2 95% Well nourished, well developed, in no acute distress HEENT: normal Neck: no JVD Cardiac:  normal S1, S2; RRR; no murmur Lungs:  clear to auscultation bilaterally, no wheezing, rhonchi or rales Abd: soft, nontender, no hepatomegaly Ext: trace to 1+ bilateral LE edema Vascular:  DP/PT pulses not palpable bilat Skin: warm and dry Neuro:  CNs 2-12 intact, no focal abnormalities noted  EKG:  NSR, HR 67, no acute changes     ASSESSMENT AND PLAN:  1. PAD:  Patient continues to have symptoms of intermittent claudication with walking short distances that is lifestyle limiting.  He also has significant limitations  from his neuropathy.  In any event, his ABIs have been abnormal (worse on the R).  Recent ABIs with some improvement (? If this is related to different technique).  Given his continued symptoms and abnormal ABIs, I have offered him the option of proceeding with a distal angiogram with an eye towards PTA/stenting.  Risk and benefits have been  reviewed with the patient. Risks include, but are not limited to, bleeding, infection, renal failure and distal embolization.  He understands the risks and would like to proceed.  D/w Dr. Sherren Mocha who agreed.  He will be set up with Dr. Burt Knack in the next few weeks. 2. Hypertension:  Controlled.  Continue current therapy.  3. Hyperlipidemia:  Continue statin. 4. Diabetes:  Managed by PCP. 5. URI:  He will follow up with PCP if no better. 6. Disposition:  Proceed with angiogram with Dr. Burt Knack in the next few weeks.   Signed, Richardson Dopp, PA-C  04/05/2013 11:38 AM

## 2013-04-25 ENCOUNTER — Telehealth: Payer: Self-pay | Admitting: Physician Assistant

## 2013-04-25 NOTE — Telephone Encounter (Signed)
s/w pt about CXR that was supposed to be done earlier this month, see 6/10 lab results where I gave pt his results but due to WBC elevated pt agreed to have CXR next day 04/06/13 at [facility]., pt never went for cxr and wanted to know why he got letter about cxr. I re-inerated about cxr for 04/06/13. Asked pt how he was feeling now and states he is feeling better and not coughing much at all since he was put on a" breathing machine " (nebulizer). Pt just had LOWER EXTREMITY ANGIOGRAM with Dr. Burt Knack on 04/18/13. I stated that I will d/w Brynda Rim. PAC to let him know that he is feeling better.

## 2013-04-25 NOTE — Telephone Encounter (Signed)
New Problem:    Patient called in response to the letter he received regarding his missed CXR.  Please call back.

## 2013-04-25 NOTE — Telephone Encounter (Signed)
F/u with PCP Richardson Dopp, PA-C   04/25/2013 5:29 PM

## 2013-04-26 ENCOUNTER — Telehealth: Payer: Self-pay | Admitting: *Deleted

## 2013-04-26 NOTE — Telephone Encounter (Signed)
pt notified to f/u w/PCP if cough comes back or gets worse per Brynda Rim. PAC. pt verbalized understanding to this instruction

## 2013-05-05 ENCOUNTER — Other Ambulatory Visit: Payer: Self-pay

## 2013-07-13 ENCOUNTER — Other Ambulatory Visit: Payer: Self-pay | Admitting: Dermatology

## 2014-02-13 ENCOUNTER — Ambulatory Visit
Admission: RE | Admit: 2014-02-13 | Discharge: 2014-02-13 | Disposition: A | Discharge status: Home / Self Care | Payer: PRIVATE HEALTH INSURANCE | Source: Ambulatory Visit | Attending: *Deleted | Admitting: *Deleted

## 2014-02-13 ENCOUNTER — Other Ambulatory Visit: Payer: Self-pay | Admitting: *Deleted

## 2014-02-13 DIAGNOSIS — M549 Dorsalgia, unspecified: Secondary | ICD-10-CM

## 2014-10-05 ENCOUNTER — Encounter (HOSPITAL_COMMUNITY): Payer: Self-pay | Admitting: Cardiovascular Disease

## 2014-10-27 DIAGNOSIS — L03116 Cellulitis of left lower limb: Secondary | ICD-10-CM

## 2014-10-27 HISTORY — DX: Cellulitis of left lower limb: L03.116

## 2015-02-02 ENCOUNTER — Other Ambulatory Visit: Payer: Self-pay | Admitting: Family Medicine

## 2015-02-02 ENCOUNTER — Ambulatory Visit
Admission: RE | Admit: 2015-02-02 | Discharge: 2015-02-02 | Disposition: A | Discharge status: Home / Self Care | Payer: No Typology Code available for payment source | Source: Ambulatory Visit | Attending: Family Medicine | Admitting: Family Medicine

## 2015-02-02 DIAGNOSIS — E13621 Other specified diabetes mellitus with foot ulcer: Secondary | ICD-10-CM

## 2015-02-02 DIAGNOSIS — L97529 Non-pressure chronic ulcer of other part of left foot with unspecified severity: Principal | ICD-10-CM

## 2015-03-21 ENCOUNTER — Encounter (HOSPITAL_BASED_OUTPATIENT_CLINIC_OR_DEPARTMENT_OTHER): Payer: Medicare (Managed Care) | Attending: Surgery

## 2015-03-21 DIAGNOSIS — K7030 Alcoholic cirrhosis of liver without ascites: Secondary | ICD-10-CM | POA: Diagnosis not present

## 2015-03-21 DIAGNOSIS — L98491 Non-pressure chronic ulcer of skin of other sites limited to breakdown of skin: Secondary | ICD-10-CM | POA: Diagnosis not present

## 2015-03-21 DIAGNOSIS — Z833 Family history of diabetes mellitus: Secondary | ICD-10-CM | POA: Diagnosis not present

## 2015-03-21 DIAGNOSIS — I10 Essential (primary) hypertension: Secondary | ICD-10-CM | POA: Diagnosis not present

## 2015-03-21 DIAGNOSIS — Z85828 Personal history of other malignant neoplasm of skin: Secondary | ICD-10-CM | POA: Diagnosis not present

## 2015-03-21 DIAGNOSIS — J449 Chronic obstructive pulmonary disease, unspecified: Secondary | ICD-10-CM | POA: Diagnosis not present

## 2015-03-21 DIAGNOSIS — L97521 Non-pressure chronic ulcer of other part of left foot limited to breakdown of skin: Secondary | ICD-10-CM | POA: Insufficient documentation

## 2015-03-21 DIAGNOSIS — E11621 Type 2 diabetes mellitus with foot ulcer: Secondary | ICD-10-CM | POA: Insufficient documentation

## 2015-03-21 DIAGNOSIS — I739 Peripheral vascular disease, unspecified: Secondary | ICD-10-CM | POA: Diagnosis not present

## 2015-03-21 DIAGNOSIS — Z8639 Personal history of other endocrine, nutritional and metabolic disease: Secondary | ICD-10-CM | POA: Diagnosis not present

## 2015-03-21 DIAGNOSIS — L84 Corns and callosities: Secondary | ICD-10-CM | POA: Diagnosis not present

## 2015-03-21 DIAGNOSIS — I2510 Atherosclerotic heart disease of native coronary artery without angina pectoris: Secondary | ICD-10-CM | POA: Diagnosis not present

## 2015-03-21 DIAGNOSIS — E1140 Type 2 diabetes mellitus with diabetic neuropathy, unspecified: Secondary | ICD-10-CM | POA: Diagnosis not present

## 2015-03-21 DIAGNOSIS — Z87891 Personal history of nicotine dependence: Secondary | ICD-10-CM | POA: Insufficient documentation

## 2015-03-21 LAB — GLUCOSE, CAPILLARY: GLUCOSE-CAPILLARY: 180 mg/dL — AB (ref 65–99)

## 2015-03-23 ENCOUNTER — Other Ambulatory Visit (HOSPITAL_COMMUNITY): Payer: Self-pay | Admitting: Family Medicine

## 2015-03-23 DIAGNOSIS — I739 Peripheral vascular disease, unspecified: Secondary | ICD-10-CM

## 2015-03-27 ENCOUNTER — Ambulatory Visit (HOSPITAL_COMMUNITY)
Admission: RE | Admit: 2015-03-27 | Discharge: 2015-03-27 | Disposition: A | Discharge status: Home / Self Care | Payer: Medicare (Managed Care) | Source: Ambulatory Visit | Attending: Cardiology | Admitting: Cardiology

## 2015-03-27 DIAGNOSIS — I739 Peripheral vascular disease, unspecified: Secondary | ICD-10-CM | POA: Diagnosis not present

## 2015-03-28 ENCOUNTER — Encounter (HOSPITAL_BASED_OUTPATIENT_CLINIC_OR_DEPARTMENT_OTHER): Payer: Medicare (Managed Care) | Attending: Surgery

## 2015-03-28 DIAGNOSIS — Z85828 Personal history of other malignant neoplasm of skin: Secondary | ICD-10-CM | POA: Insufficient documentation

## 2015-03-28 DIAGNOSIS — I2510 Atherosclerotic heart disease of native coronary artery without angina pectoris: Secondary | ICD-10-CM | POA: Insufficient documentation

## 2015-03-28 DIAGNOSIS — E1151 Type 2 diabetes mellitus with diabetic peripheral angiopathy without gangrene: Secondary | ICD-10-CM | POA: Diagnosis not present

## 2015-03-28 DIAGNOSIS — I10 Essential (primary) hypertension: Secondary | ICD-10-CM | POA: Diagnosis not present

## 2015-03-28 DIAGNOSIS — K219 Gastro-esophageal reflux disease without esophagitis: Secondary | ICD-10-CM | POA: Diagnosis not present

## 2015-03-28 DIAGNOSIS — Z79899 Other long term (current) drug therapy: Secondary | ICD-10-CM | POA: Insufficient documentation

## 2015-03-28 DIAGNOSIS — J449 Chronic obstructive pulmonary disease, unspecified: Secondary | ICD-10-CM | POA: Insufficient documentation

## 2015-03-28 DIAGNOSIS — L97521 Non-pressure chronic ulcer of other part of left foot limited to breakdown of skin: Secondary | ICD-10-CM | POA: Diagnosis not present

## 2015-03-28 DIAGNOSIS — E1140 Type 2 diabetes mellitus with diabetic neuropathy, unspecified: Secondary | ICD-10-CM | POA: Insufficient documentation

## 2015-03-28 DIAGNOSIS — E11621 Type 2 diabetes mellitus with foot ulcer: Secondary | ICD-10-CM | POA: Diagnosis not present

## 2015-03-28 DIAGNOSIS — L84 Corns and callosities: Secondary | ICD-10-CM | POA: Diagnosis not present

## 2015-04-04 DIAGNOSIS — E11621 Type 2 diabetes mellitus with foot ulcer: Secondary | ICD-10-CM | POA: Diagnosis not present

## 2015-04-05 ENCOUNTER — Telehealth: Payer: Self-pay | Admitting: Cardiovascular Disease

## 2015-04-05 NOTE — Telephone Encounter (Signed)
Received records from [facility] for appointment on 04/18/15 with Dr Gwenlyn Found.  Records given to [facility] (medical records) for Dr Kennon Holter schedule on 04/18/15. lp

## 2015-04-11 DIAGNOSIS — E11621 Type 2 diabetes mellitus with foot ulcer: Secondary | ICD-10-CM | POA: Diagnosis not present

## 2015-04-18 ENCOUNTER — Encounter: Payer: Self-pay | Admitting: Cardiovascular Disease

## 2015-04-18 ENCOUNTER — Ambulatory Visit (INDEPENDENT_AMBULATORY_CARE_PROVIDER_SITE_OTHER): Payer: Medicare (Managed Care) | Admitting: Cardiovascular Disease

## 2015-04-18 DIAGNOSIS — I739 Peripheral vascular disease, unspecified: Secondary | ICD-10-CM

## 2015-04-18 NOTE — Assessment & Plan Note (Signed)
Leslie [name] was referred by Dr. Bradd Burner for evaluation of peripheral arterial disease. He is a 69 year old moderately overweight widowed Caucasian male with no children who lives alone. 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 He has 50-80 pack years history of tobacco use having quit 13 years ago. He also has treated diabetes, hypertension and hyperlipidemia. He has peripheral neuropathy. He underwent lower lower extremity angiography by Dr. Burt Knack 04/18/13 revealing noncritical PAD with predominantly tibial vessel disease. On the left the posterior tibial was occluded which reconstituted above the ankle and the anterior tibial and pedal arteries were patent with diffuse disease. On the right the anterior tibial had a 75% ostial stenosis with three-vessel disease. He denies claudication. He has a slowly healing wound on the lateral aspect of his right fifth metatarsal which is being addressed at the [place]. He had recent ABIs performed on 03/27/15 which revealed a right ABI of 0.8 and left 0.78 although these were probably artificially elevated because of calcified noncompressible vessels. I'm going to repeat lower extremity arterial Doppler studies. At this point I do not anticipate that he will require an invasive procedure rather continued aggressive frequent local care. I will see him back when necessary.

## 2015-04-18 NOTE — Patient Instructions (Signed)
  We will see you back in follow up as needed with Dr Gwenlyn Found.   Dr Gwenlyn Found has ordered: Lower extremity arterial doppler- During this test, ultrasound is used to evaluate arterial blood flow in the legs. Allow approximately one hour for this exam.

## 2015-04-18 NOTE — Progress Notes (Signed)
04/18/2015 Leslie [name]   [birth year]  [id number]  Primary Physician Sherian Maroon, MD Primary Cardiologist: Lorretta Harp MD Renae Gloss   HPI:  Leslie [name] was referred by Dr. Bradd Burner for evaluation of peripheral arterial disease. He is a 69 year old moderately overweight widowed Caucasian male with no children who lives alone. 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 He has 50-80 pack years history of tobacco use having quit 13 years ago. He also has treated diabetes, hypertension and hyperlipidemia. He has peripheral neuropathy. He underwent lower lower extremity angiography by Dr. Burt Knack 04/18/13 revealing noncritical PAD with predominantly tibial vessel disease. On the left the posterior tibial was occluded which reconstituted above the ankle and the anterior tibial and pedal arteries were patent with diffuse disease. On the right the anterior tibial had a 75% ostial stenosis with three-vessel disease. He denies claudication. He has a slowly healing wound on the lateral aspect of his right fifth metatarsal which is being addressed at the [facility]. He had recent ABIs performed on 03/27/15 which revealed a right ABI of 0.8 and left 0.78 although these were probably artificially elevated because of calcified noncompressible vessels. I'm going to repeat lower extremity arterial Doppler studies. At this point I do not anticipate that he will require an invasive procedure rather continued aggressive frequent local care. I will see him back when necessary.   Current Outpatient Prescriptions  Medication Sig Dispense Refill  . allopurinol (ZYLOPRIM) 300 MG tablet take 1 tablet by mouth once daily 30 tablet 0  . amLODipine (NORVASC) 10 MG tablet Take 1 tablet (10 mg total) by mouth daily. 90 tablet 3  . aspirin 81 MG tablet Take 81 mg by mouth daily.    . carbamazepine (TEGRETOL) 200 MG tablet Take 200 mg by mouth every  morning.    . cholecalciferol (VITAMIN D) 1000 UNITS tablet Take 1,000 Units by mouth daily.    Marland Kitchen docusate sodium (COLACE) 100 MG capsule Take 100 mg by mouth 2 (two) times daily.    . furosemide (LASIX) 20 MG tablet take 1 tablet by mouth once daily 30 tablet 2  . HYDROcodone-acetaminophen (NORCO) 10-325 MG per tablet Take 1 tablet by mouth every 8 (eight) hours as needed for pain. 90 tablet 0  . losartan (COZAAR) 100 MG tablet Take 100 mg by mouth daily.    . metFORMIN (GLUCOPHAGE) 1000 MG tablet Take 1 tablet (1,000 mg total) by mouth 2 (two) times daily with a meal. 180 tablet 3  . metoprolol (LOPRESSOR) 100 MG tablet Take 2 tablets (200 mg total) by mouth 2 (two) times daily. 180 tablet 3  . Multiple Vitamin (MULTIVITAMIN) tablet Take 1 tablet by mouth daily.    . nitroGLYCERIN (NITROSTAT) 0.4 MG SL tablet Place 0.4 mg under the tongue every 5 (five) minutes as needed.    . pantoprazole (PROTONIX) 40 MG tablet Take 40 mg by mouth daily.    Marland Kitchen senna (SENOKOT) 8.6 MG tablet Take 2 tablets by mouth daily as needed for constipation.    . traZODone (DESYREL) 100 MG tablet Take 150 mg by mouth at bedtime.     No current facility-administered medications for this visit.    Allergies  Allergen Reactions  . Flexeril [Cyclobenzaprine] Other (See Comments)    Dry mouth and hallucinations.     History   Social History  . Marital Status: Widowed    Spouse Name:  N/A  . Number of Children: N/A  . Years of Education: N/A   Occupational History  . disabled    Social History Main Topics  . Smoking status: Former Smoker -- 2.00 packs/day for 40 years    Types: Cigarettes    Quit date: 01/13/2002  . Smokeless tobacco: Current User    Types: Chew  . Alcohol Use: 18.0 oz/week    30 Cans of beer per week     Comment: 07/01/2012 "6 pack/night; maybe 5 nights/wk"  . Drug Use: Yes    Special: Marijuana     Comment: 07/01/2012 "last pot probably 1 month ago"  . Sexual Activity: No   Other Topics  Concern  . Not on file   Social History Narrative   Disabled--former Architectural technologist.   Widowed.   5-8 years education   Rents home, owns a car    >4 beers/day   Likes to fish   Pets:  4 cockatiels and 2 lovebirds     Review of Systems: General: negative for chills, fever, night sweats or weight changes.  Cardiovascular: negative for chest pain, dyspnea on exertion, edema, orthopnea, palpitations, paroxysmal nocturnal dyspnea or shortness of breath Dermatological: negative for rash Respiratory: negative for cough or wheezing Urologic: negative for hematuria Abdominal: negative for nausea, vomiting, diarrhea, bright red blood per rectum, melena, or hematemesis Neurologic: negative for visual changes, syncope, or dizziness All other systems reviewed and are otherwise negative except as noted above.    Blood pressure 130/68, pulse 68, height 5\' 6"  (1.676 m), weight 231 lb (104.781 kg).  General appearance: alert and no distress Neck: no adenopathy, no carotid bruit, no JVD, supple, symmetrical, trachea midline and thyroid not enlarged, symmetric, no tenderness/mass/nodules Lungs: clear to auscultation bilaterally Heart: regular rate and rhythm, S1, S2 normal, no murmur, click, rub or gallop Extremities: extremities normal, atraumatic, no cyanosis or edema and relatively small shallow  ulcer lateral aspect of left fifth metatarsal  EKG not performed today  ASSESSMENT AND PLAN:   Peripheral arterial disease Leslie [name] was referred by Dr. Bradd Burner for evaluation of peripheral arterial disease. He is a 69 year old moderately overweight widowed Caucasian male with no children who lives alone. 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 He has 50-80 pack years history of tobacco use having quit 13 years ago. He also has treated diabetes, hypertension and hyperlipidemia. He has peripheral neuropathy. He underwent lower lower extremity angiography  by Dr. Burt Knack 04/18/13 revealing noncritical PAD with predominantly tibial vessel disease. On the left the posterior tibial was occluded which reconstituted above the ankle and the anterior tibial and pedal arteries were patent with diffuse disease. On the right the anterior tibial had a 75% ostial stenosis with three-vessel disease. He denies claudication. He has a slowly healing wound on the lateral aspect of his right fifth metatarsal which is being addressed at the [facility]. He had recent ABIs performed on 03/27/15 which revealed a right ABI of 0.8 and left 0.78 although these were probably artificially elevated because of calcified noncompressible vessels. I'm going to repeat lower extremity arterial Doppler studies. At this point I do not anticipate that he will require an invasive procedure rather continued aggressive frequent local care. I will see him back when necessary.      Lorretta Harp MD FACP,FACC,FAHA, [facility] 04/18/2015 10:52 AM

## 2015-04-25 DIAGNOSIS — E11621 Type 2 diabetes mellitus with foot ulcer: Secondary | ICD-10-CM | POA: Diagnosis not present

## 2015-05-02 ENCOUNTER — Encounter (HOSPITAL_BASED_OUTPATIENT_CLINIC_OR_DEPARTMENT_OTHER): Payer: Medicare (Managed Care) | Attending: Surgery

## 2015-05-02 DIAGNOSIS — L97522 Non-pressure chronic ulcer of other part of left foot with fat layer exposed: Secondary | ICD-10-CM | POA: Diagnosis not present

## 2015-05-02 DIAGNOSIS — I739 Peripheral vascular disease, unspecified: Secondary | ICD-10-CM | POA: Insufficient documentation

## 2015-05-02 DIAGNOSIS — J449 Chronic obstructive pulmonary disease, unspecified: Secondary | ICD-10-CM | POA: Insufficient documentation

## 2015-05-02 DIAGNOSIS — E11621 Type 2 diabetes mellitus with foot ulcer: Secondary | ICD-10-CM | POA: Insufficient documentation

## 2015-05-02 DIAGNOSIS — I2510 Atherosclerotic heart disease of native coronary artery without angina pectoris: Secondary | ICD-10-CM | POA: Diagnosis not present

## 2015-05-02 DIAGNOSIS — L84 Corns and callosities: Secondary | ICD-10-CM | POA: Insufficient documentation

## 2015-05-02 DIAGNOSIS — I10 Essential (primary) hypertension: Secondary | ICD-10-CM | POA: Insufficient documentation

## 2015-05-02 DIAGNOSIS — J45909 Unspecified asthma, uncomplicated: Secondary | ICD-10-CM | POA: Diagnosis not present

## 2015-05-02 DIAGNOSIS — M109 Gout, unspecified: Secondary | ICD-10-CM | POA: Insufficient documentation

## 2015-05-02 DIAGNOSIS — I70245 Atherosclerosis of native arteries of left leg with ulceration of other part of foot: Secondary | ICD-10-CM | POA: Diagnosis not present

## 2015-05-04 DIAGNOSIS — E11621 Type 2 diabetes mellitus with foot ulcer: Secondary | ICD-10-CM | POA: Diagnosis not present

## 2015-05-04 DIAGNOSIS — I70245 Atherosclerosis of native arteries of left leg with ulceration of other part of foot: Secondary | ICD-10-CM | POA: Diagnosis not present

## 2015-05-04 DIAGNOSIS — L84 Corns and callosities: Secondary | ICD-10-CM | POA: Diagnosis not present

## 2015-05-04 DIAGNOSIS — L97522 Non-pressure chronic ulcer of other part of left foot with fat layer exposed: Secondary | ICD-10-CM | POA: Diagnosis not present

## 2015-05-09 DIAGNOSIS — L97522 Non-pressure chronic ulcer of other part of left foot with fat layer exposed: Secondary | ICD-10-CM | POA: Diagnosis not present

## 2015-05-11 ENCOUNTER — Ambulatory Visit (HOSPITAL_COMMUNITY)
Admission: RE | Admit: 2015-05-11 | Discharge: 2015-05-11 | Disposition: A | Discharge status: Home / Self Care | Payer: Medicare (Managed Care) | Source: Ambulatory Visit | Attending: Cardiovascular Disease | Admitting: Cardiovascular Disease

## 2015-05-11 ENCOUNTER — Other Ambulatory Visit: Payer: Self-pay | Admitting: Cardiovascular Disease

## 2015-05-11 DIAGNOSIS — I739 Peripheral vascular disease, unspecified: Secondary | ICD-10-CM

## 2015-05-11 DIAGNOSIS — L97522 Non-pressure chronic ulcer of other part of left foot with fat layer exposed: Secondary | ICD-10-CM | POA: Diagnosis not present

## 2015-05-11 DIAGNOSIS — E11621 Type 2 diabetes mellitus with foot ulcer: Secondary | ICD-10-CM | POA: Insufficient documentation

## 2015-05-11 DIAGNOSIS — L84 Corns and callosities: Secondary | ICD-10-CM | POA: Diagnosis not present

## 2015-05-11 DIAGNOSIS — I70203 Unspecified atherosclerosis of native arteries of extremities, bilateral legs: Secondary | ICD-10-CM | POA: Insufficient documentation

## 2015-05-11 DIAGNOSIS — L97429 Non-pressure chronic ulcer of left heel and midfoot with unspecified severity: Secondary | ICD-10-CM | POA: Diagnosis not present

## 2015-05-11 DIAGNOSIS — I70245 Atherosclerosis of native arteries of left leg with ulceration of other part of foot: Secondary | ICD-10-CM | POA: Diagnosis not present

## 2015-05-16 DIAGNOSIS — L97522 Non-pressure chronic ulcer of other part of left foot with fat layer exposed: Secondary | ICD-10-CM | POA: Diagnosis not present

## 2015-05-23 DIAGNOSIS — L97522 Non-pressure chronic ulcer of other part of left foot with fat layer exposed: Secondary | ICD-10-CM | POA: Diagnosis not present

## 2015-06-13 ENCOUNTER — Encounter (HOSPITAL_BASED_OUTPATIENT_CLINIC_OR_DEPARTMENT_OTHER): Payer: Medicare (Managed Care) | Attending: Surgery

## 2015-06-13 DIAGNOSIS — K219 Gastro-esophageal reflux disease without esophagitis: Secondary | ICD-10-CM | POA: Insufficient documentation

## 2015-06-13 DIAGNOSIS — E1151 Type 2 diabetes mellitus with diabetic peripheral angiopathy without gangrene: Secondary | ICD-10-CM | POA: Diagnosis not present

## 2015-06-13 DIAGNOSIS — J449 Chronic obstructive pulmonary disease, unspecified: Secondary | ICD-10-CM | POA: Insufficient documentation

## 2015-06-13 DIAGNOSIS — L84 Corns and callosities: Secondary | ICD-10-CM | POA: Insufficient documentation

## 2015-06-13 DIAGNOSIS — E11621 Type 2 diabetes mellitus with foot ulcer: Secondary | ICD-10-CM | POA: Diagnosis present

## 2015-06-13 DIAGNOSIS — K7460 Unspecified cirrhosis of liver: Secondary | ICD-10-CM | POA: Insufficient documentation

## 2015-06-13 DIAGNOSIS — I70244 Atherosclerosis of native arteries of left leg with ulceration of heel and midfoot: Secondary | ICD-10-CM | POA: Diagnosis not present

## 2015-06-13 DIAGNOSIS — I10 Essential (primary) hypertension: Secondary | ICD-10-CM | POA: Diagnosis not present

## 2015-06-13 DIAGNOSIS — L97421 Non-pressure chronic ulcer of left heel and midfoot limited to breakdown of skin: Secondary | ICD-10-CM | POA: Insufficient documentation

## 2015-06-13 DIAGNOSIS — I2510 Atherosclerotic heart disease of native coronary artery without angina pectoris: Secondary | ICD-10-CM | POA: Diagnosis not present

## 2015-06-13 DIAGNOSIS — E1140 Type 2 diabetes mellitus with diabetic neuropathy, unspecified: Secondary | ICD-10-CM | POA: Diagnosis not present

## 2015-06-13 DIAGNOSIS — M109 Gout, unspecified: Secondary | ICD-10-CM | POA: Diagnosis not present

## 2015-06-14 ENCOUNTER — Telehealth: Payer: Self-pay | Admitting: Cardiovascular Disease

## 2015-06-14 NOTE — Telephone Encounter (Signed)
Received records from [facility] for appointment on 06/20/15 with Dr Gwenlyn Found.  Records given to [facility] (medical records) for Dr Kennon Holter schedule on 06/20/15. lp

## 2015-06-14 NOTE — Telephone Encounter (Signed)
Leslie [name] is wanting to know if Leslie [name] needs to have the cast in which he is wearing come off to see Dr. Gwenlyn Found for a non healing wound .Marland Kitchen Please call   Thanks

## 2015-06-14 NOTE — Telephone Encounter (Signed)
Returned call to [employer] with [employer].No answer.[place].

## 2015-06-20 ENCOUNTER — Encounter: Payer: Self-pay | Admitting: Cardiovascular Disease

## 2015-06-20 ENCOUNTER — Ambulatory Visit (INDEPENDENT_AMBULATORY_CARE_PROVIDER_SITE_OTHER): Payer: Medicare (Managed Care) | Admitting: Cardiovascular Disease

## 2015-06-20 VITALS — BP 160/72 | HR 70 | Ht 66.0 in | Wt 223.1 lb

## 2015-06-20 DIAGNOSIS — I739 Peripheral vascular disease, unspecified: Secondary | ICD-10-CM

## 2015-06-20 DIAGNOSIS — I10 Essential (primary) hypertension: Secondary | ICD-10-CM

## 2015-06-20 DIAGNOSIS — E11621 Type 2 diabetes mellitus with foot ulcer: Secondary | ICD-10-CM | POA: Diagnosis not present

## 2015-06-20 NOTE — Assessment & Plan Note (Signed)
History of PAD with critical limb ischemia and a slowly healing wound on the lateral aspect of his fifth toe being addressed at the wound care center. Recent Dopplers performed 05/11/15 revealed a right ABI of 0.81 left appointment 88 with a high frequency signal in his mid left SFA. We will continue to follow this conservatively at the wound care center. I'll see him back in 6 weeks. If he still has no improvement and healing will consider percutaneous revascularization.

## 2015-06-20 NOTE — Progress Notes (Signed)
06/20/2015 Leslie [name]   1946/08/16  [id number]  Primary Physician Sherian Maroon, MD Primary Cardiologist: Lorretta Harp MD Renae Gloss   HPI:  Mr. Lones was referred by Dr. Bradd Burner for evaluation of peripheral arterial disease. I last saw him in the office 04/18/15. He is a 69 year old moderately overweight widowed Caucasian male with no children who lives alone. 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 He has 50-80 pack years history of tobacco use having quit 13 years ago. He also has treated diabetes, hypertension and hyperlipidemia. He has peripheral neuropathy. He underwent lower lower extremity angiography by Dr. Burt Knack 04/18/13 revealing noncritical PAD with predominantly tibial vessel disease. On the left the posterior tibial was occluded which reconstituted above the ankle and the anterior tibial and pedal arteries were patent with diffuse disease. On the right the anterior tibial had a 75% ostial stenosis with three-vessel disease. He denies claudication. He has a slowly healing wound on the lateral aspect of his right fifth metatarsal which is being addressed at the [facility]. He had recent ABIs performed on 03/27/15 which revealed a right ABI of 0.8 and left 0.78 although these were probably artificially elevated because of calcified noncompressible vessels. I'm going to repeat lower extremity arterial Doppler studies. Since I saw him 2 months ago he developed recurrent wound on his left foot which is currently wrapped. Follow-up Dopplers performed in the office 05/11/15 revealed a right ABI 0.81 with high-frequency signal in his proximal right SFA and popliteal artery, left ABI 0.88 with a hypertensive signal in his mid left SFA. We will continue to watch this conservatively and have him treated at the wound care center. Should this not improve over the next 6-8 weeks will consider percutaneous  revascularization.   Current Outpatient Prescriptions  Medication Sig Dispense Refill  . allopurinol (ZYLOPRIM) 300 MG tablet take 1 tablet by mouth once daily 30 tablet 0  . amLODipine (NORVASC) 10 MG tablet Take 1 tablet (10 mg total) by mouth daily. 90 tablet 3  . aspirin 81 MG tablet Take 81 mg by mouth daily.    . carbamazepine (TEGRETOL) 200 MG tablet Take 200 mg by mouth every morning.    . cholecalciferol (VITAMIN D) 1000 UNITS tablet Take 1,000 Units by mouth daily.    Marland Kitchen docusate sodium (COLACE) 100 MG capsule Take 100 mg by mouth 2 (two) times daily.    . furosemide (LASIX) 20 MG tablet take 1 tablet by mouth once daily 30 tablet 2  . HYDROcodone-acetaminophen (NORCO) 10-325 MG per tablet Take 1 tablet by mouth every 8 (eight) hours as needed for pain. 90 tablet 0  . losartan (COZAAR) 100 MG tablet Take 100 mg by mouth daily.    . metFORMIN (GLUCOPHAGE) 1000 MG tablet Take 1 tablet (1,000 mg total) by mouth 2 (two) times daily with a meal. 180 tablet 3  . metoprolol (LOPRESSOR) 100 MG tablet Take 2 tablets (200 mg total) by mouth 2 (two) times daily. 180 tablet 3  . Multiple Vitamin (MULTIVITAMIN) tablet Take 1 tablet by mouth daily.    . nitroGLYCERIN (NITROSTAT) 0.4 MG SL tablet Place 0.4 mg under the tongue every 5 (five) minutes as needed.    . pantoprazole (PROTONIX) 40 MG tablet Take 40 mg by mouth daily.    Marland Kitchen senna (SENOKOT) 8.6 MG tablet Take 2 tablets by mouth daily as needed for constipation.    Marland Kitchen  traZODone (DESYREL) 100 MG tablet Take 150 mg by mouth at bedtime.     No current facility-administered medications for this visit.    Allergies  Allergen Reactions  . Flexeril [Cyclobenzaprine] Other (See Comments)    Dry mouth and hallucinations.     Social History   Social History  . Marital Status: Widowed    Spouse Name: N/A  . Number of Children: N/A  . Years of Education: N/A   Occupational History  . disabled    Social History Main Topics  . Smoking  status: Former Smoker -- 2.00 packs/day for 40 years    Types: Cigarettes    Quit date: 01/13/2002  . Smokeless tobacco: Current User    Types: Chew  . Alcohol Use: 18.0 oz/week    30 Cans of beer per week     Comment: 07/01/2012 "6 pack/night; maybe 5 nights/wk"  . Drug Use: Yes    Special: Marijuana     Comment: 07/01/2012 "last pot probably 1 month ago"  . Sexual Activity: No   Other Topics Concern  . Not on file   Social History Narrative   Disabled--former Architectural technologist.   Widowed.   5-8 years education   Rents home, owns a car    >4 beers/day   Likes to fish   Pets:  4 cockatiels and 2 lovebirds     Review of Systems: General: negative for chills, fever, night sweats or weight changes.  Cardiovascular: negative for chest pain, dyspnea on exertion, edema, orthopnea, palpitations, paroxysmal nocturnal dyspnea or shortness of breath Dermatological: negative for rash Respiratory: negative for cough or wheezing Urologic: negative for hematuria Abdominal: negative for nausea, vomiting, diarrhea, bright red blood per rectum, melena, or hematemesis Neurologic: negative for visual changes, syncope, or dizziness All other systems reviewed and are otherwise negative except as noted above.    Blood pressure 160/72, pulse 70, height 5\' 6"  (1.676 m), weight 223 lb 1.6 oz (101.197 kg).  General appearance: alert and no distress Neck: no adenopathy, no carotid bruit, no JVD, supple, symmetrical, trachea midline and thyroid not enlarged, symmetric, no tenderness/mass/nodules Lungs: clear to auscultation bilaterally Heart: regular rate and rhythm, S1, S2 normal, no murmur, click, rub or gallop Extremities: extremities normal, atraumatic, no cyanosis or edema  EKG O/70 with nonspecific ST and T-wave changes. I personally reviewed this EKG  ASSESSMENT AND PLAN:   Peripheral arterial disease History of PAD with critical limb ischemia and a slowly healing wound on the lateral  aspect of his fifth toe being addressed at the wound care center. Recent Dopplers performed 05/11/15 revealed a right ABI of 0.81 left appointment 88 with a high frequency signal in his mid left SFA. We will continue to follow this conservatively at the wound care center. I'll see him back in 6 weeks. If he still has no improvement and healing will consider percutaneous revascularization.      Lorretta Harp MD FACP,FACC,FAHA, [facility] 06/20/2015 2:59 PM

## 2015-06-20 NOTE — Patient Instructions (Signed)
Your physician wants you to follow-up in: 6 weeks with Dr Berry.  You will receive a reminder letter in the mail two months in advance. If you don't receive a letter, please call our office to schedule the follow-up appointment.  

## 2015-06-21 DIAGNOSIS — E1151 Type 2 diabetes mellitus with diabetic peripheral angiopathy without gangrene: Secondary | ICD-10-CM | POA: Diagnosis not present

## 2015-06-21 DIAGNOSIS — E11621 Type 2 diabetes mellitus with foot ulcer: Secondary | ICD-10-CM | POA: Diagnosis not present

## 2015-06-21 DIAGNOSIS — L97421 Non-pressure chronic ulcer of left heel and midfoot limited to breakdown of skin: Secondary | ICD-10-CM | POA: Diagnosis not present

## 2015-06-21 DIAGNOSIS — E1140 Type 2 diabetes mellitus with diabetic neuropathy, unspecified: Secondary | ICD-10-CM | POA: Diagnosis not present

## 2015-06-22 NOTE — Telephone Encounter (Signed)
Leslie [name] from [employer] never returned call.

## 2015-06-27 DIAGNOSIS — E11621 Type 2 diabetes mellitus with foot ulcer: Secondary | ICD-10-CM | POA: Diagnosis not present

## 2015-07-04 ENCOUNTER — Encounter (HOSPITAL_BASED_OUTPATIENT_CLINIC_OR_DEPARTMENT_OTHER): Payer: Medicare (Managed Care) | Attending: Surgery

## 2015-07-04 DIAGNOSIS — L84 Corns and callosities: Secondary | ICD-10-CM | POA: Insufficient documentation

## 2015-07-04 DIAGNOSIS — I10 Essential (primary) hypertension: Secondary | ICD-10-CM | POA: Insufficient documentation

## 2015-07-04 DIAGNOSIS — I739 Peripheral vascular disease, unspecified: Secondary | ICD-10-CM | POA: Diagnosis not present

## 2015-07-04 DIAGNOSIS — J449 Chronic obstructive pulmonary disease, unspecified: Secondary | ICD-10-CM | POA: Diagnosis not present

## 2015-07-04 DIAGNOSIS — I2510 Atherosclerotic heart disease of native coronary artery without angina pectoris: Secondary | ICD-10-CM | POA: Diagnosis not present

## 2015-07-04 DIAGNOSIS — E11621 Type 2 diabetes mellitus with foot ulcer: Secondary | ICD-10-CM | POA: Insufficient documentation

## 2015-07-04 DIAGNOSIS — I70245 Atherosclerosis of native arteries of left leg with ulceration of other part of foot: Secondary | ICD-10-CM | POA: Insufficient documentation

## 2015-07-04 DIAGNOSIS — L97522 Non-pressure chronic ulcer of other part of left foot with fat layer exposed: Secondary | ICD-10-CM | POA: Diagnosis not present

## 2015-07-04 DIAGNOSIS — K7460 Unspecified cirrhosis of liver: Secondary | ICD-10-CM | POA: Insufficient documentation

## 2015-07-04 DIAGNOSIS — M109 Gout, unspecified: Secondary | ICD-10-CM | POA: Diagnosis not present

## 2015-07-11 DIAGNOSIS — L97522 Non-pressure chronic ulcer of other part of left foot with fat layer exposed: Secondary | ICD-10-CM | POA: Diagnosis not present

## 2015-07-18 DIAGNOSIS — L97522 Non-pressure chronic ulcer of other part of left foot with fat layer exposed: Secondary | ICD-10-CM | POA: Diagnosis not present

## 2015-07-25 DIAGNOSIS — L97522 Non-pressure chronic ulcer of other part of left foot with fat layer exposed: Secondary | ICD-10-CM | POA: Diagnosis not present

## 2015-07-28 DIAGNOSIS — R911 Solitary pulmonary nodule: Secondary | ICD-10-CM

## 2015-07-28 HISTORY — DX: Solitary pulmonary nodule: R91.1

## 2015-08-01 ENCOUNTER — Encounter: Payer: Self-pay | Admitting: Cardiovascular Disease

## 2015-08-01 ENCOUNTER — Ambulatory Visit (INDEPENDENT_AMBULATORY_CARE_PROVIDER_SITE_OTHER): Payer: Medicare (Managed Care) | Admitting: Cardiovascular Disease

## 2015-08-01 VITALS — BP 142/68 | HR 73 | Ht 66.0 in | Wt 225.0 lb

## 2015-08-01 DIAGNOSIS — I739 Peripheral vascular disease, unspecified: Secondary | ICD-10-CM | POA: Diagnosis not present

## 2015-08-01 DIAGNOSIS — Z01812 Encounter for preprocedural laboratory examination: Secondary | ICD-10-CM | POA: Diagnosis not present

## 2015-08-01 NOTE — Assessment & Plan Note (Signed)
Leslie [name] returns today to review his nonhealing wounds. I saw him approximately 6 weeks ago. He has documented peripheral arterial disease by duplex ultrasound 05/11/15 with a right ABI 0.81 and left 88. He did have SFA disease bilaterally. His wounds have since healed. He does complain of lifestyle limiting claudication with which limits him from ambulating long distances without discomfort. We talked about percutaneous vascular sensation which he wishes to pursue.

## 2015-08-01 NOTE — Progress Notes (Signed)
08/01/2015 Leslie [name]   June 17, 1946  [id number]  Primary Physician Sherian Maroon, MD Primary Cardiologist: Lorretta Harp MD Renae Gloss   HPI:  Leslie [name] was referred by Dr. Bradd Burner for evaluation of peripheral arterial disease. I last saw him in the office 06/20/15. He is a 69 year old moderately overweight widowed Caucasian male with no children who lives alone. 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 He has 50-80 pack years history of tobacco use having quit 13 years ago. He also has treated diabetes, hypertension and hyperlipidemia. He has peripheral neuropathy. He underwent lower lower extremity angiography by Dr. Burt Knack 04/18/13 revealing noncritical PAD with predominantly tibial vessel disease. On the left the posterior tibial was occluded which reconstituted above the ankle and the anterior tibial and pedal arteries were patent with diffuse disease. On the right the anterior tibial had a 75% ostial stenosis with three-vessel disease. He denies claudication. He has a slowly healing wound on the lateral aspect of his right fifth metatarsal which is being addressed at the [facility]. He had recent ABIs performed on 03/27/15 which revealed a right ABI of 0.8 and left 0.78 although these were probably artificially elevated because of calcified noncompressible vessels. I'm going to repeat lower extremity arterial Doppler studies. Since I saw him 2 months ago he developed recurrent wound on his left foot which is currently wrapped. Follow-up Dopplers performed in the office 05/11/15 revealed a right ABI 0.81 with high-frequency signal in his proximal right SFA and popliteal artery, left ABI 0.88 with a hypertensive signal in his mid left SFA. Since I saw him in the office 6 weeks ago his lower extremity wounds have healed with aggressive local care by the wound care center. He still complains of lifestyle limiting limiting  claudication however.  Current Outpatient Prescriptions  Medication Sig Dispense Refill  . allopurinol (ZYLOPRIM) 300 MG tablet take 1 tablet by mouth once daily 30 tablet 0  . amLODipine (NORVASC) 10 MG tablet Take 1 tablet (10 mg total) by mouth daily. 90 tablet 3  . aspirin 81 MG tablet Take 81 mg by mouth daily.    . carbamazepine (TEGRETOL) 200 MG tablet Take 200 mg by mouth every morning.    . cholecalciferol (VITAMIN D) 1000 UNITS tablet Take 1,000 Units by mouth daily.    Marland Kitchen docusate sodium (COLACE) 100 MG capsule Take 100 mg by mouth 2 (two) times daily.    . furosemide (LASIX) 20 MG tablet take 1 tablet by mouth once daily 30 tablet 2  . HYDROcodone-acetaminophen (NORCO) 10-325 MG per tablet Take 1 tablet by mouth every 8 (eight) hours as needed for pain. 90 tablet 0  . losartan (COZAAR) 100 MG tablet Take 100 mg by mouth daily.    . metFORMIN (GLUCOPHAGE) 1000 MG tablet Take 1 tablet (1,000 mg total) by mouth 2 (two) times daily with a meal. 180 tablet 3  . metoprolol (LOPRESSOR) 100 MG tablet Take 2 tablets (200 mg total) by mouth 2 (two) times daily. 180 tablet 3  . Multiple Vitamin (MULTIVITAMIN) tablet Take 1 tablet by mouth daily.    . nitroGLYCERIN (NITROSTAT) 0.4 MG SL tablet Place 0.4 mg under the tongue every 5 (five) minutes as needed.    . pantoprazole (PROTONIX) 40 MG tablet Take 40 mg by mouth daily.    Marland Kitchen senna (SENOKOT) 8.6 MG tablet Take 2 tablets by mouth daily as needed for constipation.    Marland Kitchen  traZODone (DESYREL) 100 MG tablet Take 150 mg by mouth at bedtime.     No current facility-administered medications for this visit.    Allergies  Allergen Reactions  . Flexeril [Cyclobenzaprine] Other (See Comments)    Dry mouth and hallucinations.     Social History   Social History  . Marital Status: Widowed    Spouse Name: N/A  . Number of Children: N/A  . Years of Education: N/A   Occupational History  . disabled    Social History Main Topics  . Smoking  status: Former Smoker -- 2.00 packs/day for 40 years    Types: Cigarettes    Quit date: 01/13/2002  . Smokeless tobacco: Current User    Types: Chew  . Alcohol Use: 18.0 oz/week    30 Cans of beer per week     Comment: 07/01/2012 "6 pack/night; maybe 5 nights/wk"  . Drug Use: Yes    Special: Marijuana     Comment: 07/01/2012 "last pot probably 1 month ago"  . Sexual Activity: No   Other Topics Concern  . Not on file   Social History Narrative   Disabled--former Architectural technologist.   Widowed.   5-8 years education   Rents home, owns a car    >4 beers/day   Likes to fish   Pets:  4 cockatiels and 2 lovebirds     Review of Systems: General: negative for chills, fever, night sweats or weight changes.  Cardiovascular: negative for chest pain, dyspnea on exertion, edema, orthopnea, palpitations, paroxysmal nocturnal dyspnea or shortness of breath Dermatological: negative for rash Respiratory: negative for cough or wheezing Urologic: negative for hematuria Abdominal: negative for nausea, vomiting, diarrhea, bright red blood per rectum, melena, or hematemesis Neurologic: negative for visual changes, syncope, or dizziness All other systems reviewed and are otherwise negative except as noted above.    Blood pressure 142/68, pulse 73, height 5\' 6"  (1.676 m), weight 225 lb (102.059 kg).  General appearance: alert and no distress Neck: no adenopathy, no carotid bruit, no JVD, supple, symmetrical, trachea midline and thyroid not enlarged, symmetric, no tenderness/mass/nodules Lungs: clear to auscultation bilaterally Heart: regular rate and rhythm, S1, S2 normal, no murmur, click, rub or gallop Extremities: extremities normal, atraumatic, no cyanosis or edema  EKG not performed today  ASSESSMENT AND PLAN:   Peripheral arterial disease Leslie [name] returns today to review his nonhealing wounds. I saw him approximately 6 weeks ago. He has documented peripheral arterial disease by duplex  ultrasound 05/11/15 with a right ABI 0.81 and left 88. He did have SFA disease bilaterally. His wounds have since healed. He does complain of lifestyle limiting claudication with which limits him from ambulating long distances without discomfort. We talked about percutaneous vascular sensation which he wishes to pursue.      Lorretta Harp MD FACP,FACC,FAHA, [facility] 08/01/2015 11:38 AM

## 2015-08-01 NOTE — Patient Instructions (Signed)
Medication Instructions:  Your physician recommends that you continue on your current medications as directed. Please refer to the Current Medication list given to you today.   Labwork: Your physician recommends that you return for lab work in: 7 day BEFORE schedule proecdure The lab can be found on the FIRST FLOOR of out building in [address]   Testing/Procedures: Dr. Gwenlyn Found has ordered a peripheral angiogram to be done at [facility].  This procedure is going to look at the bloodflow in your lower extremities.  If Dr. Gwenlyn Found is able to open up the arteries, you will have to spend one night in the hospital.  If he is not able to open the arteries, you will be able to go home that same day.  SCHEDULE WEEK OF 10/17  After the procedure, you will not be allowed to drive for 3 days or push, pull, or lift anything greater than 10 lbs for one week.    You will be required to have the following tests prior to the procedure:  1. Blood work-the blood work can be done no more than 7 days prior to the procedure.  It can be done at any [facility] lab.  There is one downstairs on the first floor of this building and one in the [facility] building [phone] [address], [address])  2. Chest Xray-the chest xray order has already been placed at the [facility].     *REPS: SCOTT  Puncture site LEFT GROIN  Any Other Special Instructions Will Be Listed Below (If Applicable).

## 2015-08-08 ENCOUNTER — Other Ambulatory Visit: Payer: Self-pay

## 2015-08-08 DIAGNOSIS — Z01812 Encounter for preprocedural laboratory examination: Secondary | ICD-10-CM

## 2015-08-08 DIAGNOSIS — I739 Peripheral vascular disease, unspecified: Secondary | ICD-10-CM

## 2015-08-08 NOTE — Progress Notes (Signed)
Called pt to remind to get chest-xray and lab work complete before 10/17. Pt verbalized understanding, no questions at this time.

## 2015-08-09 ENCOUNTER — Ambulatory Visit
Admission: RE | Admit: 2015-08-09 | Discharge: 2015-08-09 | Disposition: A | Discharge status: Home / Self Care | Payer: Medicare (Managed Care) | Source: Ambulatory Visit | Attending: Cardiovascular Disease | Admitting: Cardiovascular Disease

## 2015-08-09 ENCOUNTER — Telehealth: Payer: Self-pay

## 2015-08-09 DIAGNOSIS — I739 Peripheral vascular disease, unspecified: Secondary | ICD-10-CM

## 2015-08-09 DIAGNOSIS — Z01812 Encounter for preprocedural laboratory examination: Secondary | ICD-10-CM

## 2015-08-09 NOTE — Telephone Encounter (Signed)
Spoke with pt to remind he needs to get blood work and chest x-ray before procedure scheduled for Monday.  Pt verbalized understanding no questions at this time. Pt stated he would call [facility] as they said they would work with him getting these things done.

## 2015-08-13 ENCOUNTER — Ambulatory Visit (HOSPITAL_COMMUNITY)
Admission: RE | Admit: 2015-08-13 | Discharge: 2015-08-14 | Disposition: A | Discharge status: Home / Self Care | Payer: Medicare (Managed Care) | Source: Ambulatory Visit | Attending: Cardiovascular Disease | Admitting: Cardiovascular Disease

## 2015-08-13 ENCOUNTER — Encounter (HOSPITAL_COMMUNITY)
Admission: RE | Disposition: A | Discharge status: Home / Self Care | Payer: Self-pay | Source: Ambulatory Visit | Attending: Cardiovascular Disease

## 2015-08-13 ENCOUNTER — Encounter (HOSPITAL_COMMUNITY): Payer: Self-pay

## 2015-08-13 DIAGNOSIS — R911 Solitary pulmonary nodule: Secondary | ICD-10-CM

## 2015-08-13 DIAGNOSIS — Z87891 Personal history of nicotine dependence: Secondary | ICD-10-CM | POA: Diagnosis not present

## 2015-08-13 DIAGNOSIS — E785 Hyperlipidemia, unspecified: Secondary | ICD-10-CM | POA: Insufficient documentation

## 2015-08-13 DIAGNOSIS — I739 Peripheral vascular disease, unspecified: Secondary | ICD-10-CM

## 2015-08-13 DIAGNOSIS — Z7982 Long term (current) use of aspirin: Secondary | ICD-10-CM | POA: Diagnosis not present

## 2015-08-13 DIAGNOSIS — M109 Gout, unspecified: Secondary | ICD-10-CM | POA: Diagnosis present

## 2015-08-13 DIAGNOSIS — I70213 Atherosclerosis of native arteries of extremities with intermittent claudication, bilateral legs: Secondary | ICD-10-CM | POA: Insufficient documentation

## 2015-08-13 DIAGNOSIS — Z7984 Long term (current) use of oral hypoglycemic drugs: Secondary | ICD-10-CM | POA: Insufficient documentation

## 2015-08-13 DIAGNOSIS — E669 Obesity, unspecified: Secondary | ICD-10-CM | POA: Diagnosis present

## 2015-08-13 DIAGNOSIS — E1140 Type 2 diabetes mellitus with diabetic neuropathy, unspecified: Secondary | ICD-10-CM | POA: Diagnosis present

## 2015-08-13 DIAGNOSIS — J449 Chronic obstructive pulmonary disease, unspecified: Secondary | ICD-10-CM | POA: Diagnosis present

## 2015-08-13 DIAGNOSIS — E119 Type 2 diabetes mellitus without complications: Secondary | ICD-10-CM | POA: Insufficient documentation

## 2015-08-13 DIAGNOSIS — K219 Gastro-esophageal reflux disease without esophagitis: Secondary | ICD-10-CM | POA: Diagnosis present

## 2015-08-13 DIAGNOSIS — Z01812 Encounter for preprocedural laboratory examination: Secondary | ICD-10-CM

## 2015-08-13 DIAGNOSIS — I10 Essential (primary) hypertension: Secondary | ICD-10-CM | POA: Diagnosis not present

## 2015-08-13 DIAGNOSIS — R072 Precordial pain: Secondary | ICD-10-CM | POA: Insufficient documentation

## 2015-08-13 HISTORY — PX: LOWER EXTREMITY ANGIOGRAM: SHX5955

## 2015-08-13 HISTORY — PX: PERIPHERAL VASCULAR CATHETERIZATION: SHX172C

## 2015-08-13 LAB — GLUCOSE, CAPILLARY
Glucose-Capillary: 131 mg/dL — ABNORMAL HIGH (ref 65–99)
Glucose-Capillary: 167 mg/dL — ABNORMAL HIGH (ref 65–99)
Glucose-Capillary: 137 mg/dL — ABNORMAL HIGH (ref 65–99)

## 2015-08-13 LAB — CBC
HEMATOCRIT: 36.6 % — AB (ref 39.0–52.0)
HEMOGLOBIN: 13.1 g/dL (ref 13.0–17.0)
MCH: 32.9 pg (ref 26.0–34.0)
MCHC: 35.8 g/dL (ref 30.0–36.0)
MCV: 92 fL (ref 78.0–100.0)
Platelets: 125 10*3/uL — ABNORMAL LOW (ref 150–400)
RBC: 3.98 MIL/uL — ABNORMAL LOW (ref 4.22–5.81)
RDW: 13.4 % (ref 11.5–15.5)
WBC: 5.6 10*3/uL (ref 4.0–10.5)

## 2015-08-13 LAB — BASIC METABOLIC PANEL
Anion gap: 13 (ref 5–15)
BUN: 6 mg/dL (ref 6–20)
CHLORIDE: 96 mmol/L — AB (ref 101–111)
CO2: 26 mmol/L (ref 22–32)
CREATININE: 0.76 mg/dL (ref 0.61–1.24)
Calcium: 8.7 mg/dL — ABNORMAL LOW (ref 8.9–10.3)
GFR calc Af Amer: >60 mL/min (ref >60)
GFR calc non Af Amer: >60 mL/min (ref >60)
GLUCOSE: 141 mg/dL — AB (ref 65–99)
Potassium: 4 mmol/L (ref 3.5–5.1)
SODIUM: 135 mmol/L (ref 135–145)

## 2015-08-13 LAB — PROTIME-INR
INR: 1.11 (ref 0.00–1.49)
Prothrombin Time: 14.5 seconds (ref 11.6–15.2)

## 2015-08-13 LAB — TSH: TSH: 1.345 u[IU]/mL (ref 0.350–4.500)

## 2015-08-13 SURGERY — LOWER EXTREMITY ANGIOGRAPHY

## 2015-08-13 MED ORDER — HYDROCODONE-ACETAMINOPHEN 10-325 MG PO TABS: 1.0000 | ORAL_TABLET | Freq: Three times a day (TID) | ORAL | Status: DC | PRN

## 2015-08-13 MED ORDER — METOPROLOL TARTRATE 100 MG PO TABS
200.0000 mg | ORAL_TABLET | Freq: Two times a day (BID) | ORAL | Status: DC
  Administered 2015-08-13 – 2015-08-14 (×2): 200 mg via ORAL
  Filled 2015-08-13: qty 8
  Filled 2015-08-13 (×3): qty 2

## 2015-08-13 MED ORDER — ACETAMINOPHEN 325 MG PO TABS: 650.0000 mg | ORAL_TABLET | ORAL | Status: DC | PRN

## 2015-08-13 MED ORDER — DOCUSATE SODIUM 100 MG PO CAPS
100.0000 mg | ORAL_CAPSULE | Freq: Two times a day (BID) | ORAL | Status: DC
  Administered 2015-08-13 – 2015-08-14 (×2): 100 mg via ORAL
  Filled 2015-08-13 (×2): qty 1

## 2015-08-13 MED ORDER — HYDRALAZINE HCL 20 MG/ML IJ SOLN
10.0000 mg | INTRAMUSCULAR | Status: DC | PRN
  Administered 2015-08-14: 07:00:00 10 mg via INTRAVENOUS

## 2015-08-13 MED ORDER — SENNOSIDES 8.6 MG PO TABS: 2.0000 | ORAL_TABLET | Freq: Every day | ORAL | Status: DC

## 2015-08-13 MED ORDER — LOSARTAN POTASSIUM 50 MG PO TABS: 100.0000 mg | ORAL_TABLET | Freq: Every day | ORAL | Status: DC

## 2015-08-13 MED ORDER — ONDANSETRON HCL 4 MG/2ML IJ SOLN: 4.0000 mg | Freq: Four times a day (QID) | INTRAMUSCULAR | Status: DC | PRN

## 2015-08-13 MED ORDER — SODIUM CHLORIDE 0.9 % WEIGHT BASED INFUSION
3.0000 mL/kg/h | INTRAVENOUS | Status: DC
  Administered 2015-08-13: 3 mL/kg/h via INTRAVENOUS

## 2015-08-13 MED ORDER — ENOXAPARIN SODIUM 40 MG/0.4ML ~~LOC~~ SOLN
40.0000 mg | SUBCUTANEOUS | Status: DC
  Administered 2015-08-14: 40 mg via SUBCUTANEOUS
  Filled 2015-08-13: qty 0.4

## 2015-08-13 MED ORDER — ASPIRIN 81 MG PO TABS: 81.0000 mg | ORAL_TABLET | Freq: Every day | ORAL | Status: DC

## 2015-08-13 MED ORDER — SODIUM CHLORIDE 0.9 % WEIGHT BASED INFUSION: 1.0000 mL/kg/h | INTRAVENOUS | Status: DC

## 2015-08-13 MED ORDER — LIDOCAINE HCL (PF) 1 % IJ SOLN
INTRAMUSCULAR | Status: AC
  Filled 2015-08-13: qty 30

## 2015-08-13 MED ORDER — SODIUM CHLORIDE 0.9 % IV SOLN: 250.0000 mL | INTRAVENOUS | Status: DC | PRN

## 2015-08-13 MED ORDER — MIDAZOLAM HCL 2 MG/2ML IJ SOLN
INTRAMUSCULAR | Status: AC
  Filled 2015-08-13: qty 4

## 2015-08-13 MED ORDER — ASPIRIN EC 81 MG PO TBEC
81.0000 mg | DELAYED_RELEASE_TABLET | Freq: Every day | ORAL | Status: DC
  Administered 2015-08-14: 81 mg via ORAL
  Filled 2015-08-13: qty 1

## 2015-08-13 MED ORDER — ASPIRIN 81 MG PO CHEW
81.0000 mg | CHEWABLE_TABLET | ORAL | Status: AC
  Administered 2015-08-13: 81 mg via ORAL

## 2015-08-13 MED ORDER — FENTANYL CITRATE (PF) 100 MCG/2ML IJ SOLN
INTRAMUSCULAR | Status: AC
Start: 2015-08-13 — End: 2015-08-13
  Filled 2015-08-13: qty 4

## 2015-08-13 MED ORDER — ZOLPIDEM TARTRATE 5 MG PO TABS: 5.0000 mg | ORAL_TABLET | Freq: Every evening | ORAL | Status: DC | PRN

## 2015-08-13 MED ORDER — MIDAZOLAM HCL 2 MG/2ML IJ SOLN
INTRAMUSCULAR | Status: DC | PRN
  Administered 2015-08-13: 1 mg via INTRAVENOUS

## 2015-08-13 MED ORDER — LIDOCAINE HCL (PF) 1 % IJ SOLN
INTRAMUSCULAR | Status: DC | PRN
  Administered 2015-08-13: 30 mL

## 2015-08-13 MED ORDER — SODIUM CHLORIDE 0.9 % IJ SOLN
3.0000 mL | Freq: Two times a day (BID) | INTRAMUSCULAR | Status: DC
  Administered 2015-08-14: 11:00:00 3 mL via INTRAVENOUS

## 2015-08-13 MED ORDER — SODIUM CHLORIDE 0.9 % IV SOLN
INTRAVENOUS | Status: AC
  Administered 2015-08-13 (×2): via INTRAVENOUS

## 2015-08-13 MED ORDER — TRAZODONE HCL 150 MG PO TABS
150.0000 mg | ORAL_TABLET | Freq: Every day | ORAL | Status: DC
  Administered 2015-08-13: 20:00:00 150 mg via ORAL
  Filled 2015-08-13 (×2): qty 1

## 2015-08-13 MED ORDER — VITAMIN D3 25 MCG (1000 UNIT) PO TABS
1000.0000 [IU] | ORAL_TABLET | Freq: Every day | ORAL | Status: DC
  Administered 2015-08-14: 09:00:00 1000 [IU] via ORAL
  Filled 2015-08-13 (×2): qty 1

## 2015-08-13 MED ORDER — SODIUM CHLORIDE 0.9 % IJ SOLN: 3.0000 mL | INTRAMUSCULAR | Status: DC | PRN

## 2015-08-13 MED ORDER — INSULIN ASPART 100 UNIT/ML ~~LOC~~ SOLN
0.0000 [IU] | Freq: Three times a day (TID) | SUBCUTANEOUS | Status: DC
  Administered 2015-08-13: 19:00:00 3 [IU] via SUBCUTANEOUS
  Administered 2015-08-13 – 2015-08-14 (×3): 2 [IU] via SUBCUTANEOUS

## 2015-08-13 MED ORDER — ALLOPURINOL 300 MG PO TABS
300.0000 mg | ORAL_TABLET | Freq: Every day | ORAL | Status: DC
  Administered 2015-08-14: 300 mg via ORAL
  Filled 2015-08-13: qty 1

## 2015-08-13 MED ORDER — ALPRAZOLAM 0.25 MG PO TABS: 0.2500 mg | ORAL_TABLET | Freq: Two times a day (BID) | ORAL | Status: DC | PRN

## 2015-08-13 MED ORDER — ASPIRIN 81 MG PO CHEW
CHEWABLE_TABLET | ORAL | Status: AC
  Administered 2015-08-13: 81 mg via ORAL
  Filled 2015-08-13: qty 1

## 2015-08-13 MED ORDER — SENNA 8.6 MG PO TABS
2.0000 | ORAL_TABLET | Freq: Every day | ORAL | Status: DC
  Administered 2015-08-13: 20:00:00 17.2 mg via ORAL
  Filled 2015-08-13 (×2): qty 2

## 2015-08-13 MED ORDER — CARBAMAZEPINE 200 MG PO TABS
200.0000 mg | ORAL_TABLET | Freq: Every morning | ORAL | Status: DC
  Administered 2015-08-14: 11:00:00 200 mg via ORAL
  Filled 2015-08-13: qty 1

## 2015-08-13 MED ORDER — NITROGLYCERIN 0.4 MG SL SUBL
0.4000 mg | SUBLINGUAL_TABLET | SUBLINGUAL | Status: DC | PRN
Start: 2015-08-13 — End: 2015-08-14

## 2015-08-13 MED ORDER — IODIXANOL 320 MG/ML IV SOLN
INTRAVENOUS | Status: DC | PRN
  Administered 2015-08-13: 125 mL via INTRAVENOUS

## 2015-08-13 MED ORDER — INSULIN ASPART 100 UNIT/ML ~~LOC~~ SOLN: 0.0000 [IU] | Freq: Every day | SUBCUTANEOUS | Status: DC

## 2015-08-13 MED ORDER — AMLODIPINE BESYLATE 10 MG PO TABS
10.0000 mg | ORAL_TABLET | Freq: Every day | ORAL | Status: DC
  Administered 2015-08-14: 07:00:00 10 mg via ORAL
  Filled 2015-08-13: qty 1

## 2015-08-13 MED ORDER — HEPARIN (PORCINE) IN NACL 2-0.9 UNIT/ML-% IJ SOLN
INTRAMUSCULAR | Status: AC
  Filled 2015-08-13: qty 1000

## 2015-08-13 MED ORDER — LOSARTAN POTASSIUM 50 MG PO TABS
100.0000 mg | ORAL_TABLET | Freq: Every day | ORAL | Status: DC
  Administered 2015-08-14: 100 mg via ORAL
  Filled 2015-08-13: qty 2

## 2015-08-13 MED ORDER — FENTANYL CITRATE (PF) 100 MCG/2ML IJ SOLN
INTRAMUSCULAR | Status: DC | PRN
  Administered 2015-08-13: 25 ug via INTRAVENOUS

## 2015-08-13 MED ORDER — MORPHINE SULFATE (PF) 2 MG/ML IV SOLN: 2.0000 mg | INTRAVENOUS | Status: DC | PRN

## 2015-08-13 MED ORDER — PANTOPRAZOLE SODIUM 40 MG PO TBEC
40.0000 mg | DELAYED_RELEASE_TABLET | Freq: Every day | ORAL | Status: DC
  Administered 2015-08-14: 40 mg via ORAL
  Filled 2015-08-13: qty 1

## 2015-08-13 MED ORDER — HYDRALAZINE HCL 20 MG/ML IJ SOLN
INTRAMUSCULAR | Status: DC | PRN
  Administered 2015-08-13: 10 mg via INTRAVENOUS

## 2015-08-13 MED ORDER — CARBAMAZEPINE 200 MG PO TABS
300.0000 mg | ORAL_TABLET | Freq: Every day | ORAL | Status: DC
  Administered 2015-08-13: 300 mg via ORAL
  Filled 2015-08-13 (×2): qty 1.5

## 2015-08-13 SURGICAL SUPPLY — 9 items
CATH ANGIO 5F PIGTAIL 65CM (CATHETERS) ×4 IMPLANT
KIT PV (KITS) ×4 IMPLANT
SHEATH PINNACLE 5F 10CM (SHEATH) ×4 IMPLANT
STOPCOCK MORSE 400PSI 3WAY (MISCELLANEOUS) ×4 IMPLANT
SYR MEDRAD MARK V 150ML (SYRINGE) ×4 IMPLANT
TRANSDUCER W/STOPCOCK (MISCELLANEOUS) ×4 IMPLANT
TRAY PV CATH (CUSTOM PROCEDURE TRAY) ×4 IMPLANT
TUBING CIL FLEX 10 FLL-RA (TUBING) ×4 IMPLANT
WIRE HITORQ VERSACORE ST 145CM (WIRE) ×4 IMPLANT

## 2015-08-13 NOTE — Progress Notes (Signed)
Paged Dr. Gwenlyn Found to inform that pt has no one to stay overnight with him. Order received to admit pt overnight. Pt informed and bed requested.

## 2015-08-13 NOTE — Progress Notes (Signed)
Site area: right groin  Site Prior to Removal:  Level 0  Pressure Applied For 20 MINUTES    Minutes Beginning at 1510  Manual:   Yes.    Patient Status During Pull:  stable  Post Pull Groin Site:  Level 0  Post Pull Instructions Given:  Yes.    Post Pull Pulses Present:  Yes.    Dressing Applied:  Yes.     Bedrest Begins: 1530  Comments:  Pt tolerated right femoral sheath pull well. VSS

## 2015-08-13 NOTE — Interval H&P Note (Signed)
History and Physical Interval Note:  08/13/2015 2:00 PM  Leslie [name]  has presented today for surgery, with the diagnosis of pad  The various methods of treatment have been discussed with the patient and family. After consideration of risks, benefits and other options for treatment, the patient has consented to  Procedure(s): Lower Extremity Angiography (N/A) as a surgical intervention .  The patient's history has been reviewed, patient examined, no change in status, stable for surgery.  I have reviewed the patient's chart and labs.  Questions were answered to the patient's satisfaction.     Quay Burow

## 2015-08-13 NOTE — H&P (View-Only) (Signed)
08/01/2015 Leslie [name]   03-31-1946  [id number]  Primary Physician Sherian Maroon, MD Primary Cardiologist: Lorretta Harp MD Renae Gloss   HPI:  Mr. Osterkamp was referred by Dr. Bradd Burner for evaluation of peripheral arterial disease. I last saw him in the office 06/20/15. He is a 69 year old moderately overweight widowed Caucasian male with no children who lives alone. 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 He has 50-80 pack years history of tobacco use having quit 13 years ago. He also has treated diabetes, hypertension and hyperlipidemia. He has peripheral neuropathy. He underwent lower lower extremity angiography by Dr. Burt Knack 04/18/13 revealing noncritical PAD with predominantly tibial vessel disease. On the left the posterior tibial was occluded which reconstituted above the ankle and the anterior tibial and pedal arteries were patent with diffuse disease. On the right the anterior tibial had a 75% ostial stenosis with three-vessel disease. He denies claudication. He has a slowly healing wound on the lateral aspect of his right fifth metatarsal which is being addressed at the [facility]. He had recent ABIs performed on 03/27/15 which revealed a right ABI of 0.8 and left 0.78 although these were probably artificially elevated because of calcified noncompressible vessels. I'm going to repeat lower extremity arterial Doppler studies. Since I saw him 2 months ago he developed recurrent wound on his left foot which is currently wrapped. Follow-up Dopplers performed in the office 05/11/15 revealed a right ABI 0.81 with high-frequency signal in his proximal right SFA and popliteal artery, left ABI 0.88 with a hypertensive signal in his mid left SFA. Since I saw him in the office 6 weeks ago his lower extremity wounds have healed with aggressive local care by the wound care center. He still complains of lifestyle limiting limiting  claudication however.  Current Outpatient Prescriptions  Medication Sig Dispense Refill  . allopurinol (ZYLOPRIM) 300 MG tablet take 1 tablet by mouth once daily 30 tablet 0  . amLODipine (NORVASC) 10 MG tablet Take 1 tablet (10 mg total) by mouth daily. 90 tablet 3  . aspirin 81 MG tablet Take 81 mg by mouth daily.    . carbamazepine (TEGRETOL) 200 MG tablet Take 200 mg by mouth every morning.    . cholecalciferol (VITAMIN D) 1000 UNITS tablet Take 1,000 Units by mouth daily.    Marland Kitchen docusate sodium (COLACE) 100 MG capsule Take 100 mg by mouth 2 (two) times daily.    . furosemide (LASIX) 20 MG tablet take 1 tablet by mouth once daily 30 tablet 2  . HYDROcodone-acetaminophen (NORCO) 10-325 MG per tablet Take 1 tablet by mouth every 8 (eight) hours as needed for pain. 90 tablet 0  . losartan (COZAAR) 100 MG tablet Take 100 mg by mouth daily.    . metFORMIN (GLUCOPHAGE) 1000 MG tablet Take 1 tablet (1,000 mg total) by mouth 2 (two) times daily with a meal. 180 tablet 3  . metoprolol (LOPRESSOR) 100 MG tablet Take 2 tablets (200 mg total) by mouth 2 (two) times daily. 180 tablet 3  . Multiple Vitamin (MULTIVITAMIN) tablet Take 1 tablet by mouth daily.    . nitroGLYCERIN (NITROSTAT) 0.4 MG SL tablet Place 0.4 mg under the tongue every 5 (five) minutes as needed.    . pantoprazole (PROTONIX) 40 MG tablet Take 40 mg by mouth daily.    Marland Kitchen senna (SENOKOT) 8.6 MG tablet Take 2 tablets by mouth daily as needed for constipation.    Marland Kitchen  traZODone (DESYREL) 100 MG tablet Take 150 mg by mouth at bedtime.     No current facility-administered medications for this visit.    Allergies  Allergen Reactions  . Flexeril [Cyclobenzaprine] Other (See Comments)    Dry mouth and hallucinations.     Social History   Social History  . Marital Status: Widowed    Spouse Name: N/A  . Number of Children: N/A  . Years of Education: N/A   Occupational History  . disabled    Social History Main Topics  . Smoking  status: Former Smoker -- 2.00 packs/day for 40 years    Types: Cigarettes    Quit date: 01/13/2002  . Smokeless tobacco: Current User    Types: Chew  . Alcohol Use: 18.0 oz/week    30 Cans of beer per week     Comment: 07/01/2012 "6 pack/night; maybe 5 nights/wk"  . Drug Use: Yes    Special: Marijuana     Comment: 07/01/2012 "last pot probably 1 month ago"  . Sexual Activity: No   Other Topics Concern  . Not on file   Social History Narrative   Disabled--former Architectural technologist.   Widowed.   5-8 years education   Rents home, owns a car    >4 beers/day   Likes to fish   Pets:  4 cockatiels and 2 lovebirds     Review of Systems: General: negative for chills, fever, night sweats or weight changes.  Cardiovascular: negative for chest pain, dyspnea on exertion, edema, orthopnea, palpitations, paroxysmal nocturnal dyspnea or shortness of breath Dermatological: negative for rash Respiratory: negative for cough or wheezing Urologic: negative for hematuria Abdominal: negative for nausea, vomiting, diarrhea, bright red blood per rectum, melena, or hematemesis Neurologic: negative for visual changes, syncope, or dizziness All other systems reviewed and are otherwise negative except as noted above.    Blood pressure 142/68, pulse 73, height 5\' 6"  (1.676 m), weight 225 lb (102.059 kg).  General appearance: alert and no distress Neck: no adenopathy, no carotid bruit, no JVD, supple, symmetrical, trachea midline and thyroid not enlarged, symmetric, no tenderness/mass/nodules Lungs: clear to auscultation bilaterally Heart: regular rate and rhythm, S1, S2 normal, no murmur, click, rub or gallop Extremities: extremities normal, atraumatic, no cyanosis or edema  EKG not performed today  ASSESSMENT AND PLAN:   Peripheral arterial disease Mr. Parady returns today to review his nonhealing wounds. I saw him approximately 6 weeks ago. He has documented peripheral arterial disease by duplex  ultrasound 05/11/15 with a right ABI 0.81 and left 88. He did have SFA disease bilaterally. His wounds have since healed. He does complain of lifestyle limiting claudication with which limits him from ambulating long distances without discomfort. We talked about percutaneous vascular sensation which he wishes to pursue.      Lorretta Harp MD FACP,FACC,FAHA, [facility] 08/01/2015 11:38 AM

## 2015-08-14 ENCOUNTER — Ambulatory Visit (HOSPITAL_COMMUNITY): Payer: Medicare (Managed Care)

## 2015-08-14 ENCOUNTER — Encounter (HOSPITAL_COMMUNITY): Payer: Self-pay | Admitting: Cardiovascular Disease

## 2015-08-14 DIAGNOSIS — I70213 Atherosclerosis of native arteries of extremities with intermittent claudication, bilateral legs: Secondary | ICD-10-CM | POA: Diagnosis not present

## 2015-08-14 DIAGNOSIS — R911 Solitary pulmonary nodule: Secondary | ICD-10-CM | POA: Insufficient documentation

## 2015-08-14 DIAGNOSIS — I739 Peripheral vascular disease, unspecified: Secondary | ICD-10-CM | POA: Diagnosis not present

## 2015-08-14 LAB — BASIC METABOLIC PANEL
Anion gap: 9 (ref 5–15)
BUN: 7 mg/dL (ref 6–20)
CHLORIDE: 98 mmol/L — AB (ref 101–111)
CO2: 30 mmol/L (ref 22–32)
Calcium: 8.8 mg/dL — ABNORMAL LOW (ref 8.9–10.3)
Creatinine, Ser: 0.88 mg/dL (ref 0.61–1.24)
GFR calc non Af Amer: >60 mL/min (ref >60)
Glucose, Bld: 145 mg/dL — ABNORMAL HIGH (ref 65–99)
POTASSIUM: 4 mmol/L (ref 3.5–5.1)
SODIUM: 137 mmol/L (ref 135–145)

## 2015-08-14 LAB — GLUCOSE, CAPILLARY
GLUCOSE-CAPILLARY: 148 mg/dL — AB (ref 65–99)
GLUCOSE-CAPILLARY: 144 mg/dL — AB (ref 65–99)

## 2015-08-14 MED ORDER — IOHEXOL 300 MG/ML  SOLN
75.0000 mL | Freq: Once | INTRAMUSCULAR | Status: AC | PRN
  Administered 2015-08-14: 12:00:00 75 mL via INTRAVENOUS

## 2015-08-14 MED ORDER — METFORMIN HCL 1000 MG PO TABS: 1000.0000 mg | ORAL_TABLET | Freq: Two times a day (BID) | ORAL | Status: DC

## 2015-08-14 NOTE — Discharge Summary (Signed)
Discharge Summary   [name] ID: Leslie [name] MRN: [id number], DOB/AGE: Dec 31, 1945 69 y.o. Admit date: 08/13/2015 D/C date:     08/14/2015  Primary Cardiologist: Dr. Gwenlyn Found   Principal Problem:   Peripheral arterial disease ([place]) Active Problems:   Diabetic neuropathy ([place])   HLD (hyperlipidemia)   OBESITY   HTN (hypertension)   COPD (chronic obstructive pulmonary disease) ([place])   GERD   Gout    Admission Dates: 08/13/15-08/14/15 Discharge Diagnosis: PAD with lifestyle limiting claudication s/p PV angiogram with no intervention and continued on medical therapy.   HPI: Leslie [name] is a 69 y.o. 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 He has 50-80 pack years history of tobacco use having quit 13 years ago.  He underwent lower lower extremity angiography by Dr. Burt Knack 04/18/13 revealing noncritical PAD with predominantly tibial vessel disease. On Leslie left Leslie posterior tibial was occluded which reconstituted above Leslie ankle and Leslie anterior tibial and pedal arteries were patent with diffuse disease. On Leslie right Leslie anterior tibial had a 75% ostial stenosis with three-vessel disease. He denies claudication. He has a slowly healing wound on Leslie lateral aspect of his right fifth metatarsal which is being addressed at Leslie [place]. He had recent ABIs performed on 03/27/15 which revealed a right ABI of 0.8 and left 0.78 although these were probably artificially elevated because of calcified noncompressible vessels. Follow-up Dopplers performed in Leslie office 05/11/15 revealed a right ABI 0.81 with high-frequency signal in his proximal right SFA and popliteal artery, left ABI 0.88 with a hypertensive signal in his mid left  SFA.   He was seen in Leslie office by Dr. Gwenlyn Found on 08/01/15 to review his nonhealing wounds. His wounds had healed at that time. He did complain of lifestyle limiting claudication with which limits him from ambulating long distances without discomfort so he was set up for peripheral arterial angiography on 08/13/15.    Hospital Course  Peripheral arterial disease -- He presented to [facility] on 08/13/15 for PV angiography which revealed  1: Abdominal aortogram-Leslie renal arteries were widely patent. Leslie infrarenal dominant aorta was free of  significant atherosclerotic changes  2: Left lower extremity-Leslie entire left SFA was diffusely calcified and stenotic in Leslie 90% range. There was  one-vessel runoff via Leslie peroneal  3: Right lower extremity-Leslie entire right SFA was diffusely calcified And 90% plus stenosis throughout its  entirety. There was three-vessel runoff -- IMPRESSION: Leslie [name] has high-grade severely calcified, diffusely diseased SFAs bilaterally with one-vessel runoff on Leslie left and 3 on Leslie right. Dr. Gwenlyn Found did not believe his SFAs was percutaneously revascularizable. Given Leslie fact that his symptoms are claudication and not critical and ischemia he recommended continuing medical therapy. -- He will continue ASA.   DM-  He will hold his metformin 48 hours post contrast dye exposure.   Pulmonary nodule- 11 mm pulmonary nodule incidentally noted on CXR on 08/09/15. He reports exposure to asbestos year ago. We got a CT chest which reported: "Leslie radiographic abnormality corresponds to a calcified pleural plaque. Other calcified and noncalcified pleural plaques are present. This suggests asbestos exposure, prior inflammatory process, or prior granulomatous disease." It recommended repeat CT chest in  1 year if at high risk for bronchogenic carcinoma. I have sent a staff message to his PCP about this. ( CT report below)  HTN- BP elevated this admission. Continue amlodipine 10mg , losartan 100mg ,  lopressor 200mg  BID and lasix 40mg . We will defer medication changes to provider at outpatient appointment.   Leslie [name] has had an uncomplicated hospital course and is recovering well. Leslie femoral catheter site is stable. He has been seen by Dr. Ron Parker today and deemed ready for discharge home. All follow-up appointments have been scheduled. Discharge medications are listed below.   Discharge Vitals: Blood pressure 138/86, pulse 69, temperature 98 F (36.7 C), temperature source Oral, resp. rate 18, height 5\' 6"  (1.676 m), weight 221 lb 1.9 oz (100.3 kg), SpO2 98 %.  Physical Exam  General appearance: alert and no distress Neck: no adenopathy, no carotid bruit, no JVD, supple, symmetrical, trachea midline and thyroid not enlarged, symmetric, no tenderness/mass/nodules Lungs: clear to auscultation bilaterally Heart: regular rate and rhythm, S1, S2 normal, no murmur, click, rub or gallop Extremities: extremities normal, atraumatic, no cyanosis or edema. Femoral cath site stable.    Labs: Lab Results  Component Value Date   WBC 5.6 08/13/2015   HGB 13.1 08/13/2015   HCT 36.6* 08/13/2015   MCV 92.0 08/13/2015   PLT 125* 08/13/2015     Recent Labs Lab 08/14/15 0507  NA 137  K 4.0  CL 98*  CO2 30  BUN 7  CREATININE 0.88  CALCIUM 8.8*  GLUCOSE 145*   No results for input(s): CKTOTAL, CKMB, TROPONINI in Leslie last 72 hours. Lab Results  Component Value Date   CHOL 155 05/30/2009   HDL 41 05/30/2009   LDLCALC 91 05/30/2009   TRIG 115 05/30/2009   No results found for: DDIMER  Diagnostic Studies/Procedures   Dg Chest 2 View  08/09/2015  CLINICAL DATA:  Preop stent placement in Leslie leg. EXAM: CHEST  2 VIEW COMPARISON:  04/09/2004 FINDINGS: There is a 11 mm pulmonary nodule in Leslie right midlung. There is no focal parenchymal opacity. There is no pleural effusion or pneumothorax. Leslie heart and mediastinal contours are unremarkable. Leslie osseous structures are unremarkable.  IMPRESSION: No active cardiopulmonary disease. 11 mm pulmonary nodule in Leslie right midlung. Recommend further evaluation with a nonemergent CT of Leslie chest. Electronically Signed   By: Kathreen Devoid   On: 08/09/2015 10:18   Ct Chest W Contrast  08/14/2015  CLINICAL DATA:  Pulmonary nodule EXAM: CT CHEST WITH CONTRAST TECHNIQUE: Multidetector CT imaging of Leslie chest was performed during intravenous contrast administration. CONTRAST:  83mL OMNIPAQUE IOHEXOL 300 MG/ML  SOLN COMPARISON:  None. FINDINGS: Normal thyroid No abnormal mediastinal adenopathy Three vessel moderate coronary artery calcification. Atherosclerotic calcifications of Leslie aortic arch and great vessels. There is some smooth plaque within Leslie descending thoracic aorta. 3 mm right upper lobe pulmonary nodule on image 25. 3 mm right middle lobe nodule on image 37. Several bilateral calcified and noncalcified pleural plaques are present. One of these corresponds to Leslie radiographic abnormality. Healing bilateral rib deformities are noted. No definite acute rib fracture. No vertebral compression deformity. Calcified granuloma in Leslie liver. IMPRESSION: Leslie radiographic abnormality corresponds to a calcified pleural plaque. Other calcified and noncalcified pleural plaques are present. This suggests asbestos exposure, prior inflammatory process, or prior granulomatous disease. 3 mm right lung nodules. If Leslie [name] is at high risk for bronchogenic carcinoma, follow-up chest CT at 1 year is recommended. If Leslie [name] is at low  risk, no follow-up is needed. This recommendation follows Leslie consensus statement: Guidelines for Management of Small Pulmonary Nodules Detected on CT Scans: A Statement from Leslie [place] as published in Radiology 2005; 237:395-400. Electronically Signed   By: Marybelle Killings M.D.   On: 08/14/2015 12:25    DATE OF PROCEDURE: 08/13/2015  DATE OF DISCHARGE:      PV Angiogram/Intervention History obtained from chart  review.Leslie [name] was referred by Dr. Bradd Burner for evaluation of peripheral arterial disease. I last saw him in Leslie office 06/20/15. He is a 69 year old moderately overweight widowed Caucasian male with no children who lives alone. 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 He has 50-80 pack years history of tobacco use having quit 13 years ago. He also has treated diabetes, hypertension and hyperlipidemia. He has peripheral neuropathy. He underwent lower lower extremity angiography by Dr. Burt Knack 04/18/13 revealing noncritical PAD with predominantly tibial vessel disease. On Leslie left Leslie posterior tibial was occluded which reconstituted above Leslie ankle and Leslie anterior tibial and pedal arteries were patent with diffuse disease. On Leslie right Leslie anterior tibial had a 75% ostial stenosis with three-vessel disease. He denies claudication. He has a slowly healing wound on Leslie lateral aspect of his right fifth metatarsal which is being addressed at [facility] [place]. He had recent ABIs performed on 03/27/15 which revealed a right ABI of 0.8 and left 0.78 although these were probably artificially elevated because of calcified noncompressible vessels. I'm going to repeat lower extremity arterial Doppler studies. Since I saw him 2 months ago he developed recurrent wound on his left foot which is currently wrapped. Follow-up Dopplers performed in Leslie office 05/11/15 revealed a right ABI 0.81 with high-frequency signal in his proximal right SFA and popliteal artery, left ABI 0.88 with a hypertensive signal in his mid left SFA. Since I saw him in Leslie office 6 weeks ago his lower extremity wounds have healed with aggressive local care by Leslie wound care center. He still complains of lifestyle limiting limiting claudication however. He presents today for lower extremity angiography and potential intervention.  Pre Procedure Diagnosis: peripheral arterial disease  Post Procedure  Diagnosis: peripheral arterial disease  Operators: Dr. Quay Burow  Procedures Performed: 1. Abdominal aortogram 2. Bilateral iliac angiogram 3. Bifemoral runoff  Angiographic Data:   1: Abdominal aortogram-Leslie renal arteries were widely patent. Leslie infrarenal dominant aorta was free of significant atherosclerotic changes 2: Left lower extremity-Leslie entire left SFA was diffusely calcified and stenotic in Leslie 90% range. There was one-vessel runoff via Leslie peroneal 3: Right lower extremity-Leslie entire right SFA was diffusely calcified And 90% plus stenosis throughout its entirety. There was three-vessel runoff  IMPRESSION: Leslie [name] has high-grade severely calcified, diffusely diseased SFAs bilaterally with one-vessel runoff on Leslie left and 3 on Leslie right. I do not believe his SFAs are percutaneously revascularizable. Given Leslie fact that his symptoms are claudication and not critical and ischemia I recommend continuing medical therapy. Leslie sheath was removed and pressure held on Leslie groin to achieve hemostasis. Leslie [name] left Leslie lab in stable condition. He will be discharged home and follow-up in Leslie office in several weeks.   Discharge Medications     Medication List    TAKE these medications        allopurinol 300 MG tablet  Commonly known as:  ZYLOPRIM  take 1 tablet by mouth once daily     amLODipine 10  MG tablet  Commonly known as:  NORVASC  Take 1 tablet (10 mg total) by mouth daily.     aspirin 81 MG tablet  Take 81 mg by mouth daily.     carbamazepine 200 MG tablet  Commonly known as:  TEGRETOL  Take 200-300 mg by mouth 2 (two) times daily. Take 1 tablet in Leslie morning and 1 1/2 tabs in Leslie evening     cholecalciferol 1000 UNITS tablet  Commonly known as:  VITAMIN D  Take 1,000 Units by mouth daily.     cyanocobalamin 1000 MCG/ML injection  Commonly known as:  (VITAMIN B-12)  Inject 1,000 mcg into Leslie muscle  every 30 (thirty) days.     docusate sodium 100 MG capsule  Commonly known as:  COLACE  Take 100 mg by mouth 2 (two) times daily.     furosemide 20 MG tablet  Commonly known as:  LASIX  take 1 tablet by mouth once daily     HYDROcodone-acetaminophen 10-325 MG tablet  Commonly known as:  NORCO  Take 1 tablet by mouth every 8 (eight) hours as needed for pain.     losartan 100 MG tablet  Commonly known as:  COZAAR  Take 100 mg by mouth daily.     metFORMIN 1000 MG tablet  Commonly known as:  GLUCOPHAGE  Take 1 tablet (1,000 mg total) by mouth 2 (two) times daily with a meal.  Start taking on:  08/16/2015     metoprolol 100 MG tablet  Commonly known as:  LOPRESSOR  Take 2 tablets (200 mg total) by mouth 2 (two) times daily.     nitroGLYCERIN 0.4 MG SL tablet  Commonly known as:  NITROSTAT  Place 0.4 mg under Leslie tongue every 5 (five) minutes as needed.     pantoprazole 40 MG tablet  Commonly known as:  PROTONIX  Take 40 mg by mouth daily.     senna 8.6 MG tablet  Commonly known as:  SENOKOT  Take 2 tablets by mouth at bedtime.     traZODone 100 MG tablet  Commonly known as:  DESYREL  Take 150 mg by mouth at bedtime.        Disposition   Leslie [name] will be discharged in stable condition to home.  Follow-up Information    Follow up with HAGER, BRYAN, PA-C On 08/29/2015.   Specialties:  Physician Assistant, Radiology, Interventional Cardiology   Why:  @ 8:30am on a day that Dr. Gwenlyn Found is in Leslie office.    Contact information:   [address] [place] [address] [phone]         Duration of Discharge Encounter: Greater than 30 minutes including physician and PA time.  SignedAngelena Form R PA-C 08/14/2015, 2:15 PM [name] seen and examined. I agree with Leslie assessment and plan as detailed above. See also my additional thoughts below.   Leslie [name]'s peripheral vascular status was stable. Decision was made to proceed with chest CT to  fully evaluate Leslie findings on a recent chest x-ray. Leslie report raises Leslie question of possible asbestosis. There is recommendation for follow-up scan in one year. Information is being sent to Leslie [name]'s primary physician.  Dola Argyle, MD, [facility] 08/15/2015 7:33 AM

## 2015-08-14 NOTE — Discharge Instructions (Signed)

## 2015-08-29 ENCOUNTER — Encounter: Payer: Self-pay | Admitting: Physician Assistant

## 2015-08-29 ENCOUNTER — Ambulatory Visit (INDEPENDENT_AMBULATORY_CARE_PROVIDER_SITE_OTHER): Payer: Medicare (Managed Care) | Admitting: Physician Assistant

## 2015-08-29 VITALS — BP 148/80 | HR 70 | Ht 66.0 in | Wt 224.5 lb

## 2015-08-29 DIAGNOSIS — E785 Hyperlipidemia, unspecified: Secondary | ICD-10-CM | POA: Diagnosis not present

## 2015-08-29 DIAGNOSIS — I739 Peripheral vascular disease, unspecified: Secondary | ICD-10-CM

## 2015-08-29 DIAGNOSIS — I10 Essential (primary) hypertension: Secondary | ICD-10-CM | POA: Diagnosis not present

## 2015-08-29 NOTE — Patient Instructions (Signed)
Your physician recommends that you schedule a follow-up appointment in:6 months with Dr Berry 

## 2015-08-29 NOTE — Progress Notes (Signed)
Patient ID: Leslie [name], male   DOB: 05-25-1946, 69 y.o.   MRN: [id number]    Date:  08/29/2015   ID:  Leslie [name], DOB October 18, 1946, MRN [id number]  PCP:  Sherian Maroon, MD  Primary Cardiologist:  Gwenlyn Found  Chief Complaint  Patient presents with  . Follow-up    2 wk, post PV//pt c/o chest pain this morning, did not last long//no other Sx.     History of Present Illness: Leslie [name] is a 69 y.o. 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 He underwent lower lower extremity angiography by Dr. Burt Knack 04/18/13 revealing noncritical PAD with predominantly tibial vessel disease. On the left the posterior tibial was occluded which reconstituted above the ankle and the anterior tibial and pedal arteries were patent with diffuse disease. On the right the anterior tibial had a 75% ostial stenosis with three-vessel disease. He denied claudication. He has a slowly healing wound on the lateral aspect of his right fifth metatarsal which is being addressed at the [place]. He had recent ABIs performed on 03/27/15 which revealed a right ABI of 0.8 and left 0.78 although these were probably artificially elevated because of calcified noncompressible vessels. Follow-up Dopplers performed in the office 05/11/15 revealed a right ABI 0.81 with high-frequency signal in his proximal right SFA and popliteal artery, left ABI 0.88 with a hypertensive signal in his mid left SFA.   He was seen in the office by Dr. Gwenlyn Found on 08/01/15 to review his nonhealing wounds. His wounds had healed at that time. He did complain of lifestyle limiting claudication with which limits him from  ambulating long distances without discomfort so he was set up for peripheral arterial angiography on 08/13/15 which revealed 1: Abdominal aortogram-the renal arteries were widely patent. The infrarenal dominant aorta was free of significant atherosclerotic changes 2: Left lower extremity-the entire left SFA was diffusely calcified and stenotic in the 90% range. There was one-vessel runoff via the peroneal 3: Right lower extremity-the entire right SFA was diffusely calcified And 90% plus stenosis throughout its entirety. There was three-vessel runoff  IMPRESSION: [name] has high-grade severely calcified, diffusely diseased SFAs bilaterally with one-vessel runoff on the left and 3 on the right. Dr. Gwenlyn Found did not believe his SFAs were percutaneously revascularizable. Given the fact that his symptoms are claudication and not critical and ischemia he recommended continuing medical therapy. -- He will continue ASA.   The patient presents for posthospital follow-up. No change in claudication symptoms. States he can get for very far without his calves getting tight. The ulcer on his left lower extremity has been healed up for some time now but it took about 7 months to do it.  He reports having chest pain which was sharp this morning while he was sitting on the couch. It lasted about 3 minutes. Last time his knee use nitroglycerin was about 2 months ago. He is not having any chest pain with ambulation today.  The patient currently denies nausea, vomiting, fever, shortness of breath, orthopnea, dizziness, PND, cough, congestion, abdominal pain, hematochezia, melena, lower extremity edema.  Wt Readings from Last 3 Encounters:  08/29/15 224 lb 8 oz (101.833 kg)  08/14/15 221 lb 1.9 oz (100.3 kg)  08/01/15 225 lb (102.059 kg)     Past Medical History  Diagnosis Date  . Asthma   . COPD (chronic obstructive pulmonary disease) ([place])   . GERD (gastroesophageal  reflux disease)   . Hyperlipidemia   . Hypertension   . Substance abuse   . Peripheral neuropathy ([place]) 07/01/2012  . History of bronchitis 07/01/2012    "I've had it all my life"  . Type II diabetes mellitus ([place]) 2002    Diagnosed in 2002  . Gouty arthritis 07/01/2012  . Chronic lower back pain 07/01/2012  . Skin growth 07/01/2012    right side of nares; "been on there 20 years"  . Hepatitis 1967    "in jail when I got it; dr said it was pretty bad; quaranteened X 30d"  . Cellulitis of right foot 06/30/2012  . Peripheral arterial disease ([place])     critical limb ischemia    Current Outpatient Prescriptions  Medication Sig Dispense Refill  . allopurinol (ZYLOPRIM) 300 MG tablet take 1 tablet by mouth once daily 30 tablet 0  . amLODipine (NORVASC) 10 MG tablet Take 1 tablet (10 mg total) by mouth daily. 90 tablet 3  . aspirin 81 MG tablet Take 81 mg by mouth daily.    . carbamazepine (TEGRETOL) 200 MG tablet Take 200-300 mg by mouth 2 (two) times daily. Take 1 tablet in the morning and 1 1/2 tabs in the evening    . cholecalciferol (VITAMIN D) 1000 UNITS tablet Take 1,000 Units by mouth daily.    . cyanocobalamin (,VITAMIN B-12,) 1000 MCG/ML injection Inject 1,000 mcg into the muscle every 30 (thirty) days.    Marland Kitchen docusate sodium (COLACE) 100 MG capsule Take 100 mg by mouth 2 (two) times daily.    . furosemide (LASIX) 20 MG tablet take 1 tablet by mouth once daily 30 tablet 2  . HYDROcodone-acetaminophen (NORCO) 10-325 MG per tablet Take 1 tablet by mouth every 8 (eight) hours as needed for pain. 90 tablet 0  . losartan (COZAAR) 100 MG tablet Take 100 mg by mouth daily.    . metFORMIN (GLUCOPHAGE) 1000 MG tablet Take 1 tablet (1,000 mg total) by mouth 2 (two) times daily with a meal. 180 tablet 3  . metoprolol (LOPRESSOR) 100 MG tablet Take 2 tablets (200 mg total) by mouth 2 (two) times daily. 180 tablet 3  . nitroGLYCERIN (NITROSTAT) 0.4 MG SL tablet Place 0.4 mg under the tongue every 5 (five)  minutes as needed.    . pantoprazole (PROTONIX) 40 MG tablet Take 40 mg by mouth daily.    Marland Kitchen senna (SENOKOT) 8.6 MG tablet Take 2 tablets by mouth at bedtime.     . traZODone (DESYREL) 100 MG tablet Take 150 mg by mouth at bedtime.     No current facility-administered medications for this visit.    Allergies:    Allergies  Allergen Reactions  . Flexeril [Cyclobenzaprine] Other (See Comments)    Dry mouth and hallucinations.     Social History:  The patient  reports that he quit smoking about 13 years ago. His smoking use included Cigarettes. He has a 80 pack-year smoking history. His smokeless tobacco use includes Chew. He reports that he drinks about 18.0 oz of alcohol per week. He reports that he uses illicit drugs (Marijuana).   Family history:  No family history on file.  ROS:  Please see the history  of present illness.  All other systems reviewed and negative.   PHYSICAL EXAM: VS:  BP 148/80 mmHg  Pulse 70  Ht 5\' 6"  (1.676 m)  Wt 224 lb 8 oz (101.833 kg)  BMI 36.25 kg/m2 Obese, well developed, in no acute distress HEENT: Pupils are equal round react to light accommodation extraocular movements are intact.  Neck: no JVDNo cervical lymphadenopathy. Cardiac: Regular rate and rhythm without murmurs rubs or gallops. Lungs:  clear to auscultation bilaterally, no wheezing, rhonchi or rales Ext: no lower extremity edema.  2+ radial and right dorsalis pedis pulses/posterior tibialis. 0 on the left Skin: warm and dry.  Right groin cath site is well-healed no hematoma or ecchymosis. Neuro:  Grossly normal  EKG: Normal sinus rhythm rate 70 bpm  .lipid ASSESSMENT AND PLAN:  Problem List Items Addressed This Visit    Peripheral arterial disease (HCC)   HTN (hypertension)   HLD (hyperlipidemia) - Primary     Peripheral arterial disease high-grade severely calcified, diffusely diseased SFAs bilaterally with one-vessel runoff on the left and 3 on the right coincide with his  palpable pulses.. Dr. Gwenlyn Found did not believe his SFAs were percutaneously revascularizable. Given the fact that his symptoms are claudication and not critical and ischemia, he recommended continuing medical therapy.  He will continue ASA.   Essential hypertension: Blood pressure mildly elevated no changes in current therapy. Patient is on max doses of metoprolol, losartan, Norvasc. Renal arteries were patent.

## 2016-03-26 ENCOUNTER — Encounter (HOSPITAL_BASED_OUTPATIENT_CLINIC_OR_DEPARTMENT_OTHER): Payer: Medicare (Managed Care) | Attending: Surgery

## 2016-03-26 DIAGNOSIS — T8130XA Disruption of wound, unspecified, initial encounter: Secondary | ICD-10-CM | POA: Diagnosis not present

## 2016-03-26 DIAGNOSIS — I2510 Atherosclerotic heart disease of native coronary artery without angina pectoris: Secondary | ICD-10-CM | POA: Diagnosis not present

## 2016-03-26 DIAGNOSIS — E1140 Type 2 diabetes mellitus with diabetic neuropathy, unspecified: Secondary | ICD-10-CM | POA: Insufficient documentation

## 2016-03-26 DIAGNOSIS — Z955 Presence of coronary angioplasty implant and graft: Secondary | ICD-10-CM | POA: Diagnosis not present

## 2016-03-26 DIAGNOSIS — I10 Essential (primary) hypertension: Secondary | ICD-10-CM | POA: Insufficient documentation

## 2016-03-26 DIAGNOSIS — I252 Old myocardial infarction: Secondary | ICD-10-CM | POA: Diagnosis not present

## 2016-03-26 DIAGNOSIS — L97421 Non-pressure chronic ulcer of left heel and midfoot limited to breakdown of skin: Secondary | ICD-10-CM | POA: Diagnosis not present

## 2016-03-26 DIAGNOSIS — E11621 Type 2 diabetes mellitus with foot ulcer: Secondary | ICD-10-CM | POA: Diagnosis not present

## 2016-03-26 DIAGNOSIS — L97521 Non-pressure chronic ulcer of other part of left foot limited to breakdown of skin: Secondary | ICD-10-CM | POA: Insufficient documentation

## 2016-03-26 DIAGNOSIS — G4730 Sleep apnea, unspecified: Secondary | ICD-10-CM | POA: Insufficient documentation

## 2016-03-27 ENCOUNTER — Inpatient Hospital Stay (HOSPITAL_COMMUNITY): Payer: Medicare (Managed Care)

## 2016-03-27 ENCOUNTER — Inpatient Hospital Stay (HOSPITAL_COMMUNITY)
Admission: EM | Admit: 2016-03-27 | Discharge: 2016-03-31 | DRG: 256 | Disposition: A | Discharge status: Home / Self Care | Payer: Medicare (Managed Care) | Attending: Oncology | Admitting: Oncology

## 2016-03-27 ENCOUNTER — Emergency Department (HOSPITAL_COMMUNITY): Payer: Medicare (Managed Care)

## 2016-03-27 ENCOUNTER — Encounter (HOSPITAL_COMMUNITY): Payer: Self-pay

## 2016-03-27 DIAGNOSIS — Z79899 Other long term (current) drug therapy: Secondary | ICD-10-CM | POA: Diagnosis not present

## 2016-03-27 DIAGNOSIS — Z7982 Long term (current) use of aspirin: Secondary | ICD-10-CM | POA: Diagnosis not present

## 2016-03-27 DIAGNOSIS — E11621 Type 2 diabetes mellitus with foot ulcer: Secondary | ICD-10-CM | POA: Diagnosis present

## 2016-03-27 DIAGNOSIS — E1142 Type 2 diabetes mellitus with diabetic polyneuropathy: Secondary | ICD-10-CM | POA: Diagnosis present

## 2016-03-27 DIAGNOSIS — E1140 Type 2 diabetes mellitus with diabetic neuropathy, unspecified: Secondary | ICD-10-CM | POA: Diagnosis not present

## 2016-03-27 DIAGNOSIS — G8929 Other chronic pain: Secondary | ICD-10-CM | POA: Diagnosis present

## 2016-03-27 DIAGNOSIS — L03032 Cellulitis of left toe: Secondary | ICD-10-CM | POA: Diagnosis present

## 2016-03-27 DIAGNOSIS — Z6836 Body mass index (BMI) 36.0-36.9, adult: Secondary | ICD-10-CM | POA: Diagnosis not present

## 2016-03-27 DIAGNOSIS — I10 Essential (primary) hypertension: Secondary | ICD-10-CM | POA: Diagnosis present

## 2016-03-27 DIAGNOSIS — M545 Low back pain: Secondary | ICD-10-CM | POA: Diagnosis present

## 2016-03-27 DIAGNOSIS — E669 Obesity, unspecified: Secondary | ICD-10-CM | POA: Diagnosis present

## 2016-03-27 DIAGNOSIS — Z66 Do not resuscitate: Secondary | ICD-10-CM | POA: Diagnosis present

## 2016-03-27 DIAGNOSIS — E1151 Type 2 diabetes mellitus with diabetic peripheral angiopathy without gangrene: Principal | ICD-10-CM | POA: Diagnosis present

## 2016-03-27 DIAGNOSIS — E1169 Type 2 diabetes mellitus with other specified complication: Secondary | ICD-10-CM | POA: Diagnosis present

## 2016-03-27 DIAGNOSIS — Z888 Allergy status to other drugs, medicaments and biological substances status: Secondary | ICD-10-CM | POA: Diagnosis not present

## 2016-03-27 DIAGNOSIS — Z87891 Personal history of nicotine dependence: Secondary | ICD-10-CM

## 2016-03-27 DIAGNOSIS — L089 Local infection of the skin and subcutaneous tissue, unspecified: Secondary | ICD-10-CM | POA: Diagnosis present

## 2016-03-27 DIAGNOSIS — M869 Osteomyelitis, unspecified: Secondary | ICD-10-CM | POA: Insufficient documentation

## 2016-03-27 DIAGNOSIS — M86172 Other acute osteomyelitis, left ankle and foot: Secondary | ICD-10-CM | POA: Diagnosis not present

## 2016-03-27 DIAGNOSIS — J449 Chronic obstructive pulmonary disease, unspecified: Secondary | ICD-10-CM | POA: Diagnosis present

## 2016-03-27 DIAGNOSIS — D649 Anemia, unspecified: Secondary | ICD-10-CM | POA: Diagnosis not present

## 2016-03-27 DIAGNOSIS — K219 Gastro-esophageal reflux disease without esophagitis: Secondary | ICD-10-CM | POA: Diagnosis present

## 2016-03-27 DIAGNOSIS — E11628 Type 2 diabetes mellitus with other skin complications: Secondary | ICD-10-CM | POA: Diagnosis present

## 2016-03-27 DIAGNOSIS — B9689 Other specified bacterial agents as the cause of diseases classified elsewhere: Secondary | ICD-10-CM | POA: Diagnosis not present

## 2016-03-27 DIAGNOSIS — M009 Pyogenic arthritis, unspecified: Secondary | ICD-10-CM | POA: Diagnosis present

## 2016-03-27 DIAGNOSIS — M1A9XX0 Chronic gout, unspecified, without tophus (tophi): Secondary | ICD-10-CM | POA: Diagnosis present

## 2016-03-27 DIAGNOSIS — L0291 Cutaneous abscess, unspecified: Secondary | ICD-10-CM

## 2016-03-27 DIAGNOSIS — Z89422 Acquired absence of other left toe(s): Secondary | ICD-10-CM | POA: Diagnosis not present

## 2016-03-27 DIAGNOSIS — E785 Hyperlipidemia, unspecified: Secondary | ICD-10-CM | POA: Diagnosis present

## 2016-03-27 DIAGNOSIS — Z7984 Long term (current) use of oral hypoglycemic drugs: Secondary | ICD-10-CM

## 2016-03-27 DIAGNOSIS — M00872 Arthritis due to other bacteria, left ankle and foot: Secondary | ICD-10-CM | POA: Diagnosis not present

## 2016-03-27 DIAGNOSIS — I739 Peripheral vascular disease, unspecified: Secondary | ICD-10-CM | POA: Diagnosis present

## 2016-03-27 HISTORY — DX: Cellulitis of left lower limb: L03.116

## 2016-03-27 HISTORY — DX: Solitary pulmonary nodule: R91.1

## 2016-03-27 LAB — CBC WITH DIFFERENTIAL/PLATELET
Basophils Absolute: 0 10*3/uL (ref 0.0–0.1)
Basophils Relative: 0 %
EOS PCT: 0 %
Eosinophils Absolute: 0 10*3/uL (ref 0.0–0.7)
HCT: 30.8 % — ABNORMAL LOW (ref 39.0–52.0)
HEMOGLOBIN: 10.4 g/dL — AB (ref 13.0–17.0)
LYMPHS ABS: 2.3 10*3/uL (ref 0.7–4.0)
Lymphocytes Relative: 18 %
MCH: 30.3 pg (ref 26.0–34.0)
MCHC: 33.8 g/dL (ref 30.0–36.0)
MCV: 89.8 fL (ref 78.0–100.0)
MONOS PCT: 13 %
Monocytes Absolute: 1.7 10*3/uL — ABNORMAL HIGH (ref 0.1–1.0)
Neutro Abs: 9 10*3/uL — ABNORMAL HIGH (ref 1.7–7.7)
Neutrophils Relative %: 69 %
PLATELETS: 187 10*3/uL (ref 150–400)
RBC: 3.43 MIL/uL — AB (ref 4.22–5.81)
RDW: 13 % (ref 11.5–15.5)
WBC: 13.1 10*3/uL — AB (ref 4.0–10.5)

## 2016-03-27 LAB — I-STAT CHEM 8, ED
BUN: 16 mg/dL (ref 6–20)
CALCIUM ION: 1.07 mmol/L — AB (ref 1.13–1.30)
CHLORIDE: 95 mmol/L — AB (ref 101–111)
Creatinine, Ser: 0.9 mg/dL (ref 0.61–1.24)
GLUCOSE: 169 mg/dL — AB (ref 65–99)
HCT: 31 % — ABNORMAL LOW (ref 39.0–52.0)
Hemoglobin: 10.5 g/dL — ABNORMAL LOW (ref 13.0–17.0)
Potassium: 4 mmol/L (ref 3.5–5.1)
Sodium: 135 mmol/L (ref 135–145)
TCO2: 27 mmol/L (ref 0–100)

## 2016-03-27 LAB — GLUCOSE, CAPILLARY: Glucose-Capillary: 180 mg/dL — ABNORMAL HIGH (ref 65–99)

## 2016-03-27 LAB — C-REACTIVE PROTEIN: CRP: 23.4 mg/dL — AB (ref <1.0)

## 2016-03-27 LAB — SEDIMENTATION RATE: Sed Rate: 90 mm/hr — ABNORMAL HIGH (ref 0–16)

## 2016-03-27 MED ORDER — ASPIRIN EC 81 MG PO TBEC
81.0000 mg | DELAYED_RELEASE_TABLET | Freq: Every day | ORAL | Status: DC
  Administered 2016-03-27 – 2016-03-31 (×5): 81 mg via ORAL
  Filled 2016-03-27 (×5): qty 1

## 2016-03-27 MED ORDER — VANCOMYCIN HCL IN DEXTROSE 750-5 MG/150ML-% IV SOLN
750.0000 mg | Freq: Two times a day (BID) | INTRAVENOUS | Status: AC
  Administered 2016-03-28 – 2016-03-29 (×4): 750 mg via INTRAVENOUS
  Filled 2016-03-27 (×4): qty 150

## 2016-03-27 MED ORDER — VANCOMYCIN HCL 10 G IV SOLR
1500.0000 mg | Freq: Once | INTRAVENOUS | Status: AC
  Administered 2016-03-27: 1500 mg via INTRAVENOUS
  Filled 2016-03-27: qty 1500

## 2016-03-27 MED ORDER — INSULIN ASPART 100 UNIT/ML ~~LOC~~ SOLN
0.0000 [IU] | Freq: Three times a day (TID) | SUBCUTANEOUS | Status: DC
  Administered 2016-03-28: 2 [IU] via SUBCUTANEOUS
  Administered 2016-03-29 – 2016-03-30 (×5): 3 [IU] via SUBCUTANEOUS

## 2016-03-27 MED ORDER — GADOBENATE DIMEGLUMINE 529 MG/ML IV SOLN
20.0000 mL | Freq: Once | INTRAVENOUS | Status: AC
  Administered 2016-03-27: 20 mL via INTRAVENOUS

## 2016-03-27 MED ORDER — ALLOPURINOL 300 MG PO TABS
300.0000 mg | ORAL_TABLET | Freq: Every day | ORAL | Status: DC
  Administered 2016-03-28 – 2016-03-30 (×3): 300 mg via ORAL
  Filled 2016-03-27 (×4): qty 1

## 2016-03-27 MED ORDER — AMLODIPINE BESYLATE 10 MG PO TABS
10.0000 mg | ORAL_TABLET | Freq: Every day | ORAL | Status: DC
Start: 2016-03-28 — End: 2016-03-31
  Administered 2016-03-28 – 2016-03-31 (×4): 10 mg via ORAL
  Filled 2016-03-27 (×4): qty 1

## 2016-03-27 MED ORDER — CARBAMAZEPINE 200 MG PO TABS
200.0000 mg | ORAL_TABLET | Freq: Every day | ORAL | Status: DC
  Administered 2016-03-28 – 2016-03-31 (×4): 200 mg via ORAL
  Filled 2016-03-27 (×4): qty 1

## 2016-03-27 MED ORDER — TRAZODONE HCL 50 MG PO TABS
150.0000 mg | ORAL_TABLET | Freq: Every evening | ORAL | Status: DC | PRN
  Administered 2016-03-27: 150 mg via ORAL
  Filled 2016-03-27: qty 1

## 2016-03-27 MED ORDER — SENNOSIDES-DOCUSATE SODIUM 8.6-50 MG PO TABS
1.0000 | ORAL_TABLET | Freq: Every day | ORAL | Status: DC
  Administered 2016-03-27 – 2016-03-30 (×4): 1 via ORAL
  Filled 2016-03-27 (×4): qty 1

## 2016-03-27 MED ORDER — LOSARTAN POTASSIUM 50 MG PO TABS
100.0000 mg | ORAL_TABLET | Freq: Every day | ORAL | Status: DC
  Administered 2016-03-28 – 2016-03-31 (×4): 100 mg via ORAL
  Filled 2016-03-27 (×4): qty 2

## 2016-03-27 MED ORDER — PANTOPRAZOLE SODIUM 40 MG PO TBEC
40.0000 mg | DELAYED_RELEASE_TABLET | Freq: Every day | ORAL | Status: DC
  Administered 2016-03-28 – 2016-03-30 (×3): 40 mg via ORAL
  Filled 2016-03-27 (×4): qty 1

## 2016-03-27 MED ORDER — METRONIDAZOLE IN NACL 5-0.79 MG/ML-% IV SOLN
500.0000 mg | Freq: Three times a day (TID) | INTRAVENOUS | Status: DC
  Administered 2016-03-28 – 2016-03-29 (×6): 500 mg via INTRAVENOUS
  Filled 2016-03-27 (×7): qty 100

## 2016-03-27 MED ORDER — CARBAMAZEPINE 200 MG PO TABS
300.0000 mg | ORAL_TABLET | Freq: Every day | ORAL | Status: DC
  Administered 2016-03-27 – 2016-03-30 (×4): 300 mg via ORAL
  Filled 2016-03-27 (×4): qty 2

## 2016-03-27 MED ORDER — ENOXAPARIN SODIUM 60 MG/0.6ML ~~LOC~~ SOLN
0.5000 mg/kg | SUBCUTANEOUS | Status: DC
  Administered 2016-03-27 – 2016-03-30 (×4): 50 mg via SUBCUTANEOUS
  Filled 2016-03-27 (×4): qty 0.6

## 2016-03-27 MED ORDER — ALBUTEROL SULFATE (2.5 MG/3ML) 0.083% IN NEBU: 2.5000 mg | INHALATION_SOLUTION | Freq: Four times a day (QID) | RESPIRATORY_TRACT | Status: DC | PRN

## 2016-03-27 MED ORDER — HYDROCODONE-ACETAMINOPHEN 10-325 MG PO TABS
1.0000 | ORAL_TABLET | Freq: Four times a day (QID) | ORAL | Status: DC | PRN
  Administered 2016-03-27 – 2016-03-30 (×8): 1 via ORAL
  Filled 2016-03-27 (×8): qty 1

## 2016-03-27 MED ORDER — DEXTROSE 5 % IV SOLN
2.0000 g | INTRAVENOUS | Status: DC
  Administered 2016-03-28 – 2016-03-29 (×2): 2 g via INTRAVENOUS
  Filled 2016-03-27 (×3): qty 2

## 2016-03-27 NOTE — Progress Notes (Signed)
Called [phone] to report arrival to floor or

## 2016-03-27 NOTE — ED Notes (Signed)
Attempted to call report

## 2016-03-27 NOTE — Consult Note (Signed)
Pharmacy Antibiotic Note  Leslie [name] is a 70 y.o. male admitted on 03/27/2016 with erythematous/edematous left foot with an ulcer. 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  Pharmacy has been consulted for vancomycin dosing. Renal function wnl.  Goal: Vancomycin trough 15-20  Plan: 1) Vancomycin 1500mg  IV x 1 then 750mg  IV q12 2) Check trough at steady state 3) Follow renal function, cultures, LOT  Height: 5' 6.14" (168 cm) Weight: 224 lb 6.9 oz (101.8 kg) IBW/kg (Calculated) : 64.13  Temp (24hrs), Avg:99 F (37.2 C), Min:98.7 F (37.1 C), Max:99.2 F (37.3 C)   Recent Labs Lab 03/27/16 1100 03/27/16 1113  WBC 13.1*  --   CREATININE  --  0.90    Estimated Creatinine Clearance: 85.6 mL/min (by C-G formula based on Cr of 0.9).    Allergies  Allergen Reactions  . Flexeril [Cyclobenzaprine] Other (See Comments)    Dry mouth and hallucinations.     Antimicrobials this admission: 6/1 Vancomycin >> 6/1 Ceftriaxone >> 6/1 Metronidazole >>  Dose adjustments this admission: n/a  Microbiology results: n/a  Thank you for allowing pharmacy to be a part of this patient's care.  Deboraha Sprang 03/27/2016 6:42 PM

## 2016-03-27 NOTE — ED Notes (Signed)
Patient here with ongoing left foot infection for weeks. Seen by MD and restarted on antibiotics with no improvement. Left foot red with blistering and open wound noted to 5th posterior toe and foot. Complains of aching with same

## 2016-03-27 NOTE — ED Notes (Signed)
Phlebotomist at bedside.

## 2016-03-27 NOTE — H&P (Signed)
Date: 03/27/2016               Patient Name:  Leslie [name] MRN: [id number]  DOB: 1946-03-30 Age / Sex: 70 y.o., male   PCP: [name], MD              Medical Service: Internal Medicine Teaching Service              Attending Physician: Dr. Annia Belt, MD    First Contact: Leeroy Cha, MS 4 Pager: [phone]  Second Contact: Dr. Charlott Rakes Pager: [phone]       After Hours (After 5p/  First Contact Pager: [phone]  weekends / holidays): Second Contact Pager: [phone]   Chief Complaint: L foot wound  History of Present Illness: [name] is a 12 yoM [facility] patient with a past medical history significant for severe PAD, non-insulin-dependent DM with peripheral neuropathy, HTN, gout, and COPD who presents with a poorly healing L foot wound. Wound began 6 weeks ago. He was initially managed at his [facility]ACE clinic. He was trialed on 7-10 day courses doxycycline, bactrim, and Keflex each without resolution. He was seen at the wound clinic yesterday with Dr. Sharol Given. His providers were concerned for chronic osteomyelitis and directed the pt to go to the ED for IV antibiotics and an MRI. Has a history of diabetic foot infections on both feet with slow healing (per Epic). Last DFI on L foot was ~1 yr ago and successfully healed.   The wound is not painful as the patient cannot feel his foot. He notes increasing redness 6 days ago that initially improved and worsened x3 days. Dry skin on dorsal aspect of foot (blister-like in appearance) is unchanged. Has been walking on it. PACE clinic has been managing foot dressings. Endorses some nausea and weakness yesterday. Denies fevers, chills, abdominal pain, emesis, new diarrhea (chronic episodic diarrhea that is diet-dependent), and worsening of chronic LE swelling.    Regarding his PAD, he has undergone LE angiography that shows severe b/l calcification. Has never undergone a bypass or stenting and the stenosis is not consider revascularizable.  His disease is managed on ASA. Denies claudication but is limited in walking because his legs "won't hold." Has not had gout attack in ~5 years and is on daily allopurinol.  Meds: Current Facility-Administered Medications  Medication Dose Route Frequency Provider Last Rate Last Dose  . [START ON 03/28/2016] allopurinol (ZYLOPRIM) tablet 300 mg  300 mg Oral Daily Rushil Patel V, MD      . HYDROcodone-acetaminophen (NORCO) 10-325 MG per tablet 1 tablet  1 tablet Oral Q6H PRN Rushil Sherrye Payor, MD      . senna-docusate (Senokot-S) tablet 1 tablet  1 tablet Oral QHS Rushil Patel V, MD      . traZODone (DESYREL) tablet 150 mg  150 mg Oral QHS PRN Rushil Sherrye Payor, MD         Allergies: Allergies as of 03/27/2016 - Review Complete 03/27/2016  Allergen Reaction Noted  . Flexeril [cyclobenzaprine] Other (See Comments) 07/23/2012   Past Medical History  Diagnosis Date  . Asthma   . COPD (chronic obstructive pulmonary disease) (HCC)     Uses nebulizers at home. Former smoker, current chewing tobacco. 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  Marland Kitchen GERD (gastroesophageal reflux disease)   . Hyperlipidemia   . Hypertension   . Substance abuse   . Peripheral neuropathy ([place]) 07/01/2012  . Type II diabetes mellitus ([place]) 2002    Diagnosed in 2002  .  Gouty arthritis 07/01/2012  . Chronic lower back pain 07/01/2012  . Skin growth 07/01/2012    right side of nares; "been on there 20 years"  . Hepatitis 1967    "in jail when I got it; dr said it was pretty bad; quaranteened X 30d"  . Cellulitis of right foot 06/30/2012  . Peripheral arterial disease ([place])     critical limb ischemia  . Lung nodule, solitary 07/2015    Due for 1-yr recheck in 07/2017  . Cellulitis of left foot 2016    Healed and recurred 01/2016   Past Surgical History  Procedure Laterality Date  . Lower extremity angiogram N/A 04/18/2013    Procedure: LOWER EXTREMITY ANGIOGRAM;  Surgeon: Sherren Mocha, MD;  Location: [facility] CATH LAB;  Service: Cardiovascular;   Laterality: N/A;  . Lower extremity angiogram  08/13/2015    bilateral iliac   . Peripheral vascular catheterization Bilateral 08/13/2015    Procedure: Lower Extremity Angiography;  Surgeon: Lorretta Harp, MD;  Location: [facility] CV LAB;  Service: Cardiovascular;  Laterality: Bilateral;  . Peripheral vascular catheterization N/A 08/13/2015    Procedure: Abdominal Aortogram;  Surgeon: Lorretta Harp, MD;  Location: [facility];  Service: Cardiovascular;  Laterality: N/A;   Family History  Problem Relation Age of Onset  . Diabetes Sister   . Hypertension Mother    Social History   Social History  . Marital Status: Widowed    Spouse Name: N/A  . Number of Children: N/A  . Years of Education: N/A   Occupational History  . disabled    Social History Main Topics  . Smoking status: Former Smoker -- 2.00 packs/day for 40 years    Types: Cigarettes    Quit date: 01/13/2002  . Smokeless tobacco: Current User    Types: Chew  . Alcohol Use: 5.4 oz/week    9 Cans of beer per week     Comment: 3 beers/night on some nights. Previously 6-pack/night.  . Drug Use: No     Comment: 03/27/2016 "last marijuana was maybe in 2013"  . Sexual Activity: No   Other Topics Concern  . 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   Lives in assisted living. Performs ADLs and IADLs, including cooking and managing his medications. Friend drives him to the store. Uses rollator walker at home and motorized scooter in store.   [facility] patient   Widowed   5-8 years education   Likes to fish   Pets:  4 cockatiels and 2 lovebirds    Review of Systems: Pertinent items are noted in HPI.  Physical Exam: Blood pressure 184/65, pulse 74, temperature 99.2 F (37.3 C), temperature source Oral, resp. rate 20, SpO2 100 %. BP 184/65 mmHg  Pulse 74  Temp(Src) 99.2 F (37.3 C) (Oral)  Resp 20  SpO2 100%  General  Appearance:    Alert, cooperative, no distress, appears stated age  Lungs:     Mild intermittent wheezing in upper posterior lung fields. Crackles in R lower posterior field. No increased work of breathing.  Heart:    Regular rate and rhythm, S1 and S2 normal, no murmur, rub   or gallop  Abdomen:     Soft, obese, non-tender, normoactive bowel sounds   Extremities:   L foot - Warmer than R foot. Circular (1.5 cm in diameter), red dry-appearing lesion lateral side of foot near distal 5th metatarsal.  Surrounding erythema over distal lateral dorsum of foot stretching to mid dorsum. 0.5 cm round areas of dry skin on dorsum of foot (look like blisters but dry skin on closer examination). Drying peeling skin on planar surface of foot. No lesions between toes. Trace pretibial pitting edema. R foot normal appearing without lesions or cyanosis. Trace pretibial pitting edema.   Pulses:   2+ dorsalis pedis bilaterally  Skin:   See above  Neurologic:   Able to move toes    Lab results: Basic Metabolic Panel:  Recent Labs  03/27/16 1113  NA 135  K 4.0  CL 95*  GLUCOSE 169*  BUN 16  CREATININE 0.90   CBC:  Recent Labs  03/27/16 1100 03/27/16 1113  WBC 13.1*  --   NEUTROABS 9.0*  --   HGB 10.4* 10.5*  HCT 30.8* 31.0*  MCV 89.8  --   PLT 187  --     Imaging results:  Mr Foot Left W Wo Contrast  03/27/2016  CLINICAL DATA:  Left foot redness with a open wound over the fifth digit EXAM: MRI OF THE LEFT FOREFOOT WITHOUT AND WITH CONTRAST TECHNIQUE: Multiplanar, multisequence MR imaging was performed both before and after administration of intravenous contrast. CONTRAST:  47mL MULTIHANCE GADOBENATE DIMEGLUMINE 529 MG/ML IV SOLN COMPARISON:  None. FINDINGS: Bones/Joint/Cartilage Soft tissue wound over the lateral aspect of the fifth metatarsal head. Surrounding soft tissue edema and enhancement. Soft tissue abnormality extends down to the cortex of the fifth metatarsal head. Severe marrow edema in the  fifth metatarsal head with cortical destruction along the plantar aspect most consistent with osteomyelitis. Moderate joint effusion of the fifth MTP joint with marrow edema in the adjacent fifth proximal phalanx most concerning for septic arthritis. No other marrow signal abnormality. No fracture or dislocation. Normal alignment. Collaterals Collateral ligaments are intact. Tendons Flexor and extensor compartment tendons are intact. Muscles Mild increased T2 signal throughout the plantar musculature likely neurogenic given the patient's history of diabetes. Soft tissue No soft tissue mass. No other fluid collection or hematoma. Generalized soft tissue swelling of the left foot. IMPRESSION: 1. Soft tissue wound over the lateral aspect of the fifth metatarsal with surrounding cellulitis and cellulitis involving the dorsal aspect of the left foot. Osteomyelitis of the fifth metatarsal head and septic arthritis of the fifth MTP joint. Electronically Signed   By: Kathreen Devoid   On: 03/27/2016 14:21   Dg Foot 2 Views Left  03/27/2016  CLINICAL DATA:  Ulceration at level of proximal fifth metatarsal EXAM: LEFT FOOT - 2 VIEW COMPARISON:  January 29, 2015 FINDINGS: Frontal and lateral views were obtained. There is diffuse edema throughout the foot, most notably lateral to the distal fifth metatarsal. There is focal air in the soft tissues lateral to the distal fifth metatarsal laterally. There is no fracture or dislocation. There is no erosive change or bony destruction evident. The joint spaces appear unremarkable. A small focus of vascular calcification is noted between the proximal first and second metatarsals. There is calcification in the distal Achilles tendon. IMPRESSION: Marked soft tissue swelling. Soft tissue air focally is noted lateral to the distal fifth metatarsal, concerning for soft tissue abscess development in this area. No erosive change or bony destruction evident. No fracture or dislocation. No  appreciable erosion or joint space narrowing. From an imaging standpoint, MR pre and post-contrast would be the imaging study of choice to further evaluate if further imaging is felt to be warranted. Electronically Signed  By: Lowella Grip III M.D.   On: 03/27/2016 10:48    Other results: EKG: No performed  Assessment & Plan by Problem: Active Problems:   Diabetic foot infection ([place])   Diabetic osteomyelitis [facility])  Mr. Joya is a 70yo male and PACE patient with a PMH significant for severe PAD, non-insulin-dependent DM with peripheral neuropathy, gout, HTN, COPD, and chronic pain who was found to have osteomyelitis on MRI after failing 6 weeks of antibiotic therapy for a diabetic foot infection.  # Osteomyelitis in 5th metatarsal head, septic arthritis in 5th MTP joint, surrounding cellulitis Shown on MRI. Differential also includes gout. Less likely given prophylactic allopurinol and no recent gout attacks. Mild systemic sx including nausea and weakness yesterday. Afebrile and vitals stable. While debridement would ideally occur before starting antibiotics to obtain cultures as patient is stable, ortho has not yet had a chance to see and infection may also involve septic arthritis. As such, will begin IV antibiotics tonight to avoid delay in treatment and risk worsening of infection or joint destruction. Given bone involvement and no systemic evidence of infection, this is a Moderate Diabetic Foot Infection.  - Ortho consult - To consider for debridement. Patient may also need joint aspiration. - Start IV antibiotics - Will start ceftriaxone + metronidazole + vancomycin. MRSA coverage as patient recently received wound care in last 30 days, failed other antibiotics, and has history of slowly healing wound infection (though likely 2/2 PAD). Nasal swab negative for MRSA in past. - Wound care consult  # Non-insulin dependent DM, neuropathy A1c 6.9% in 2014. - SSI - Hold home metformin  1000 mg BID - Continue home tegretol 200mg  qdaily and 300 mg qHS - Recheck A1c  #Peripheral arterial disease - Hold home ASA 81mg  as patient may go to OR soon for debridement  #Hypertension Took meds this morning. Not currently wearing a catapres patch. BP elevated to 180s.  - Continue home amlodipine 10mg  qdaily - Continue losartan 100mg  qdaily - Hold catapres patch qweekly - Hold metoprolol 100 mg BID - Hold Lasix 20mg  qdaily. Re-evaluate volume status.  Stable Chronic Conditions # Chronic pain  - Continue home Norco 325-10mg  q8h prn.  - Start [place] #HLD - Hold home Zetia 10mg  qdaily #GERD - Continue home pantoprazole 40 mg qdaily  #COPD/asthma - Continue home albutero neb q6hr prn. Per pt, he doesn't use any inhalers  #VTE Prophylaxis - SCDs only as patient may go to OR soon for debridement  #FEN/GI - No IVF at this time. - Carb modified diet. NPO at midnight for possible debridement  #Dispo - Keep PACE MD in the loop - Lives independently  This is a Careers information officer Note.  The care of the patient was discussed with Dr. Charlott Rakes and the assessment and plan was formulated with their assistance.  Please see their note for official documentation of the patient encounter.   Signed: Kateri Plummer, Med Student 03/27/2016, 5:55 PM

## 2016-03-27 NOTE — H&P (Signed)
Date: 03/27/2016               Patient Name:  Leslie [name] MRN: [id number]  DOB: December 11, 1945 Age / Sex: 70 y.o., male   PCP: [name], MD         Medical Service: Internal Medicine Teaching Service         Attending Physician: Dr. Annia Belt, MD    First Contact: Leeroy Cha, MS4 Pager: [phone]  Second Contact: Dr. Charlott Rakes  Pager: [phone]        After Hours (After 5p/  First Contact Pager: [phone]  weekends / holidays): Second Contact Pager: [phone]   Chief Complaint: Left foot ulcer  History of Present Illness: Mr. Reep is a 70 year old male with severe peripheral arterial disease, non-insulin-dependent type 2 diabetes obligated by peripheral neuropathy, hypertension, chronic gout, COPD who presented to the ED today with a poorly healing left foot wound. He first noticed it about 6 weeks ago and has been associated with some bloody drainage. [facility] and was tried on seven-day courses of antibiotics which include doxycycline, Bactrim, Keflex. He was then scheduled to be seen by Dr. Sharol Given yesterday who recommended he be admitted today given the concern for osteomyelitis and his prior history of diabetic foot infections of both feet with slow healing in the setting of peripheral arterial disease, most recently about one year ago. Aside from mild nausea yesterday, he denies any vomiting, fever, chills, abdominal pain. He is able to perform most of his ADLs independently at home and using all drinking 3-4 beers at one time every so often. Otherwise, he denies any ongoing tobacco use or illicit drug use.  In the ED, he was found to have elevated ESR 90, CRP 23.4. Left foot MRI was notable for osteomyelitis of the fifth metatarsal head and septic arthritis of the fifth MTP joint.  Meds:  Home medications from [facility] include Albuterol nebulizer every 6 hours as needed for wheezing Allopurinol 300 mg tablets once daily Amlodipine 10 mg tablets once  daily Aspirin 81 mg once daily Biotene mouthwash as needed from dry mouth Clonidine 0.1 mg patch to be applied weekly for hypertension Centrum multivitamin 1 tablet daily Claritin 10 mg tablet once daily as needed for allergies Colace 100 mg capsules twice daily Vitamin B12 IM monthly Eucerin cream to be applied topically twice daily Lasix 20 mg once daily Losartan 100 mg once daily Metformin 1000 mg twice daily Metoprolol 200 mg twice daily Nitroglycerin 0.4 mg every 5 minutes as needed for angina Norco 10/325 to be taken 1 tablet every 8 hours as needed for chronic back pain Pantoprazole 40 mg tablets to be taken once daily Senna S2 tablets to be taken once daily for constipation Tegretol 200 mg in the morning, 300 mg at night for neuropathy Trazodone 150 mg at bedtime for insomnia Vitamin D3 1000 units once daily Zetia 10 mg tablets once daily  Current Facility-Administered Medications  Medication Dose Route Frequency Provider Last Rate Last Dose  . albuterol (PROVENTIL) (2.5 MG/3ML) 0.083% nebulizer solution 2.5 mg  2.5 mg Nebulization Q6H PRN Riccardo Dubin, MD      . Derrill Memo ON 03/28/2016] allopurinol (ZYLOPRIM) tablet 300 mg  300 mg Oral Daily Rubie Ficco Sherrye Payor, MD      . Derrill Memo ON 03/28/2016] amLODipine (NORVASC) tablet 10 mg  10 mg Oral Daily Mintie Witherington Sherrye Payor, MD      . Derrill Memo ON 03/28/2016] carbamazepine (TEGRETOL) tablet 200  mg  200 mg Oral Daily Dantonio Justen Sherrye Payor, MD      . carbamazepine (TEGRETOL) tablet 300 mg  300 mg Oral QHS Elmore Hyslop V, MD      . cefTRIAXone (ROCEPHIN) 2 g in dextrose 5 % 50 mL IVPB  2 g Intravenous Q24H Melora Menon Sherrye Payor, MD       And  . metroNIDAZOLE (FLAGYL) IVPB 500 mg  500 mg Intravenous Q8H Orrin Yurkovich Sherrye Payor, MD      . HYDROcodone-acetaminophen (NORCO) 10-325 MG per tablet 1 tablet  1 tablet Oral Q6H PRN Riccardo Dubin, MD      . Derrill Memo ON 03/28/2016] insulin aspart (novoLOG) injection 0-9 Units  0-9 Units Subcutaneous TID WC Tesha Archambeau Sherrye Payor, MD      . Derrill Memo ON  03/28/2016] losartan (COZAAR) tablet 100 mg  100 mg Oral Daily Shakeia Krus Sherrye Payor, MD      . Derrill Memo ON 03/28/2016] pantoprazole (PROTONIX) EC tablet 40 mg  40 mg Oral Daily Takiera Mayo Sherrye Payor, MD      . senna-docusate (Senokot-S) tablet 1 tablet  1 tablet Oral QHS Lawayne Hartig V, MD      . traZODone (DESYREL) tablet 150 mg  150 mg Oral QHS PRN Riccardo Dubin, MD      . vancomycin (VANCOCIN) 1,500 mg in sodium chloride 0.9 % 500 mL IVPB  1,500 mg Intravenous Once Otilio Miu, [facility]      . [START ON 03/28/2016] vancomycin (VANCOCIN) IVPB 750 mg/150 ml premix  750 mg Intravenous Q12H Otilio Miu, [facility]        Allergies: Allergies as of 03/27/2016 - Review Complete 03/27/2016  Allergen Reaction Noted  . Flexeril [cyclobenzaprine] Other (See Comments) 07/23/2012   Past Medical History  Diagnosis Date  . Asthma   . COPD (chronic obstructive pulmonary disease) (HCC)     Uses nebulizers at home. Former smoker, current chewing tobacco. 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  Marland Kitchen GERD (gastroesophageal reflux disease)   . Hyperlipidemia   . Hypertension   . Substance abuse   . Peripheral neuropathy ([place]) 07/01/2012  . Type II diabetes mellitus ([place]) 2002    Diagnosed in 2002  . Gouty arthritis 07/01/2012    Not attacks in ~5 yrs (03/2016)  . Chronic lower back pain 07/01/2012  . Skin growth 07/01/2012    right side of nares; "been on there 20 years"  . Hepatitis 1967    "in jail when I got it; dr said it was pretty bad; quaranteened X 30d"  . Cellulitis of right foot 06/30/2012  . Peripheral arterial disease ([place])     critical limb ischemia  . Lung nodule, solitary 07/2015    Due for 1-yr recheck in 07/2017  . Cellulitis of left foot 2016    Healed and recurred 01/2016   Past Surgical History  Procedure Laterality Date  . Lower extremity angiogram N/A 04/18/2013    Procedure: LOWER EXTREMITY ANGIOGRAM;  Surgeon: Sherren Mocha, MD;  Location: [facility] CATH LAB;  Service: Cardiovascular;  Laterality: N/A;  . Lower extremity  angiogram  08/13/2015    bilateral iliac   . Peripheral vascular catheterization Bilateral 08/13/2015    Procedure: Lower Extremity Angiography;  Surgeon: Lorretta Harp, MD;  Location: [place] CV LAB;  Service: Cardiovascular;  Laterality: Bilateral;  . Peripheral vascular catheterization N/A 08/13/2015    Procedure: Abdominal Aortogram;  Surgeon: Lorretta Harp, MD;  Location: [place] CV LAB;  Service: Cardiovascular;  Laterality: N/A;   Family  History  Problem Relation Age of Onset  . Diabetes Sister   . Hypertension Mother    Social History   Social History  . Marital Status: Widowed    Spouse Name: N/A  . Number of Children: N/A  . Years of Education: N/A   Occupational History  . disabled    Social History Main Topics  . Smoking status: Former Smoker -- 2.00 packs/day for 40 years    Types: Cigarettes    Quit date: 01/13/2002  . Smokeless tobacco: Current User    Types: Chew  . Alcohol Use: 5.4 oz/week    9 Cans of beer per week     Comment: 3 beers/night on some nights. Previously 6-pack/night.  . Drug Use: No     Comment: 03/27/2016 "last marijuana was maybe in 2013"  . Sexual Activity: No   Other Topics Concern  . 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   Lives in assisted living. Performs ADLs and IADLs, including cooking and managing his medications. Friend drives him to the store. Uses rollator walker at home and motorized scooter in store.   [facility] patient   Widowed   5-8 years education   Likes to fish   Pets:  4 cockatiels and 2 lovebirds    Review of Systems: Pertinent items noted in HPI and remainder of comprehensive ROS otherwise negative.  Physical Exam: Blood pressure 184/65, pulse 74, temperature 99.2 F (37.3 C), temperature source Oral, resp. rate 20, height 5' 6.14" (1.68 m), weight 224 lb 6.9 oz (101.8 kg), SpO2 100 %.  General: obese Caucasian  male, resting in bed, NAD HEENT: PERRL, EOMI, no scleral icterus, oropharynx clear Cardiac: RRR, no rubs, murmurs or gallops Pulm: clear to auscultation bilaterally, no wheezes, rales, or rhonchi Abd: soft, nontender, nondistended, BS present Ext: warm and well perfused, L foot with increased warmth as compared to the R foot as well as erythema of the dorsum. Dry skin noted bilaterally. 1-2 cm lesion noted on the lateral aspect of the distal 5th metatarsal with dried blood. Neuro: responds to questions appropriately; moving all extremities freely   Lab results: Basic Metabolic Panel:  Recent Labs  03/27/16 1113  NA 135  K 4.0  CL 95*  GLUCOSE 169*  BUN 16  CREATININE 0.90   CBC:  Recent Labs  03/27/16 1100 03/27/16 1113  WBC 13.1*  --   NEUTROABS 9.0*  --   HGB 10.4* 10.5*  HCT 30.8* 31.0*  MCV 89.8  --   PLT 187  --     Imaging results:  Mr Foot Left W Wo Contrast  03/27/2016  CLINICAL DATA:  Left foot redness with a open wound over the fifth digit EXAM: MRI OF THE LEFT FOREFOOT WITHOUT AND WITH CONTRAST TECHNIQUE: Multiplanar, multisequence MR imaging was performed both before and after administration of intravenous contrast. CONTRAST:  59m MULTIHANCE GADOBENATE DIMEGLUMINE 529 MG/ML IV SOLN COMPARISON:  None. FINDINGS: Bones/Joint/Cartilage Soft tissue wound over the lateral aspect of the fifth metatarsal head. Surrounding soft tissue edema and enhancement. Soft tissue abnormality extends down to the cortex of the fifth metatarsal head. Severe marrow edema in the fifth metatarsal head with cortical destruction along the plantar aspect most consistent with osteomyelitis. Moderate joint effusion of the fifth MTP joint with marrow edema in the adjacent fifth proximal phalanx most concerning for septic arthritis. No other marrow signal abnormality. No fracture or dislocation.  Normal alignment. Collaterals Collateral ligaments are intact. Tendons Flexor and extensor compartment  tendons are intact. Muscles Mild increased T2 signal throughout the plantar musculature likely neurogenic given the patient's history of diabetes. Soft tissue No soft tissue mass. No other fluid collection or hematoma. Generalized soft tissue swelling of the left foot. IMPRESSION: 1. Soft tissue wound over the lateral aspect of the fifth metatarsal with surrounding cellulitis and cellulitis involving the dorsal aspect of the left foot. Osteomyelitis of the fifth metatarsal head and septic arthritis of the fifth MTP joint. Electronically Signed   By: Kathreen Devoid   On: 03/27/2016 14:21   Dg Foot 2 Views Left  03/27/2016  CLINICAL DATA:  Ulceration at level of proximal fifth metatarsal EXAM: LEFT FOOT - 2 VIEW COMPARISON:  January 29, 2015 FINDINGS: Frontal and lateral views were obtained. There is diffuse edema throughout the foot, most notably lateral to the distal fifth metatarsal. There is focal air in the soft tissues lateral to the distal fifth metatarsal laterally. There is no fracture or dislocation. There is no erosive change or bony destruction evident. The joint spaces appear unremarkable. A small focus of vascular calcification is noted between the proximal first and second metatarsals. There is calcification in the distal Achilles tendon. IMPRESSION: Marked soft tissue swelling. Soft tissue air focally is noted lateral to the distal fifth metatarsal, concerning for soft tissue abscess development in this area. No erosive change or bony destruction evident. No fracture or dislocation. No appreciable erosion or joint space narrowing. From an imaging standpoint, MR pre and post-contrast would be the imaging study of choice to further evaluate if further imaging is felt to be warranted. Electronically Signed   By: Lowella Grip III M.D.   On: 03/27/2016 10:48    Assessment & Plan by Problem: Principal Problem:   Septic arthritis of left foot (HCC) Active Problems:   Diabetic neuropathy (HCC)   HLD  (hyperlipidemia)   OBESITY   HTN (hypertension)   COPD (chronic obstructive pulmonary disease) (HCC)   GERD   Chronic lower back pain on narcotics    Peripheral arterial disease (HCC)   Diabetic osteomyelitis [facility])  Mr. Hennick is a 70 year old male with severe peripheral arterial disease, non-insulin-dependent type 2 diabetes obligated by peripheral neuropathy, hypertension, chronic gout, COPD hospitalized for left foot osteomyelitis complicated by septic arthritis.  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 Vital signs reassuring for no sepsis he does have leukocytosis 13 -Consult orthopedics for debridement -Start ceftriaxone, metronidazole, vancomycin IV per foot ulcer order set -Consult Wound Care  Non-insulin-dependent type 2 diabetes complicated by neuropathy: Last A1c in system 6.9, 2014. -Start sensitive sliding scale insulin -Recheck A1c -Continue home Tegretol 200 mg daily and 300 mg at bedtime for neuropathy  Peripheral arterial disease: Continue aspirin 81 mg. Holding Zetia for now.  Chronic lower back pain on narcotics: Continue Norco 10/325 every 6 hours as needed for low back pain.  Hypertension: Blood pressure elevated 160s-180s on admission. Unclear if he is supposed to be on metoprolol 200 mg twice daily or Lasix 20 mg daily.  -Continue amlodipine 10 mg daily -Continue losartan 100 mg daily -Holding Lasix, metoprolol, clonidine  GERD: Continue pantoprazole 40 mg daily  COPD: Continue albuterol nebulizers every 6 hours as needed for wheezing  Gout: Continue allopurinol 371m daily.  #FEN:  -Diet: Carb Modified  #DVT prophylaxis: Lovenox  #CODE STATUS: DNR/DNI -Defer to VCarmelina Peal[[phone]] if patients lacks decision-making capacity -Confirmed with  patient on admission  Dispo: Disposition is deferred at this time, awaiting improvement of current medical problems.   The patient does have a current PCP  [name], MD) and does not need an [facility] hospital follow-up appointment after discharge.  The patient does not know have transportation limitations that hinder transportation to clinic appointments.  Signed: Riccardo Dubin, MD 03/27/2016, 8:23 PM

## 2016-03-27 NOTE — ED Provider Notes (Signed)
CSN: [id number]     Arrival date & time 03/27/16  [id number] History   First MD Initiated Contact with Patient 03/27/16 0935     Chief Complaint  Patient presents with  . foort infection      (Consider location/radiation/quality/duration/timing/severity/associated sxs/prior Treatment) HPI Comments: 70 y.o. male with a history of obesity, 50-80 pack hx of tobacco abuse (now quit), DM w/ peripheral neuropathy, HLD, HTN, and severe PAD who presents to ER with cc of foot inf. Pt stated noticing some redness to the foot about 5 days ago. Over the weekend the foot looked better, but he went to see his wound doctor yday, who advised him to go to the ER and get admitted for ivab and r/o osteomyelitis. Pt denies n/v/f/c at the moment. + ache to the L foot.   ROS 10 Systems reviewed and are negative for acute change except as noted in the HPI.      Past Medical History  Diagnosis Date  . Asthma   . COPD (chronic obstructive pulmonary disease) ([place])   . GERD (gastroesophageal reflux disease)   . Hyperlipidemia   . Hypertension   . Substance abuse   . Peripheral neuropathy ([place]) 07/01/2012  . History of bronchitis 07/01/2012    "I've had it all my life"  . Type II diabetes mellitus ([place]) 2002    Diagnosed in 2002  . Gouty arthritis 07/01/2012  . Chronic lower back pain 07/01/2012  . Skin growth 07/01/2012    right side of nares; "been on there 20 years"  . Hepatitis 1967    "in jail when I got it; dr said it was pretty bad; quaranteened X 30d"  . Cellulitis of right foot 06/30/2012  . Peripheral arterial disease ([place])     critical limb ischemia   Past Surgical History  Procedure Laterality Date  . Lower extremity angiogram N/A 04/18/2013    Procedure: LOWER EXTREMITY ANGIOGRAM;  Surgeon: Sherren Mocha, MD;  Location: [facility] CATH LAB;  Service: Cardiovascular;  Laterality: N/A;  . Lower extremity angiogram  08/13/2015    bilateral iliac   . Peripheral vascular catheterization Bilateral 08/13/2015     Procedure: Lower Extremity Angiography;  Surgeon: Lorretta Harp, MD;  Location: [place] CV LAB;  Service: Cardiovascular;  Laterality: Bilateral;  . Peripheral vascular catheterization N/A 08/13/2015    Procedure: Abdominal Aortogram;  Surgeon: Lorretta Harp, MD;  Location: [place] CV LAB;  Service: Cardiovascular;  Laterality: N/A;   No family history on file. Social History  Substance Use Topics  . Smoking status: Former Smoker -- 2.00 packs/day for 40 years    Types: Cigarettes    Quit date: 01/13/2002  . Smokeless tobacco: Current User    Types: Chew  . Alcohol Use: 18.0 oz/week    30 Cans of beer per week     Comment: 07/01/2012 "6 pack/night; maybe 5 nights/wk"    occasional    Review of Systems    Allergies  Flexeril  Home Medications   Prior to Admission medications   Medication Sig Start Date End Date Taking? Authorizing Provider  allopurinol (ZYLOPRIM) 300 MG tablet take 1 tablet by mouth once daily 11/02/12   Josalyn Funches, MD  amLODipine (NORVASC) 10 MG tablet Take 1 tablet (10 mg total) by mouth daily. 08/09/12   Boykin Nearing, MD  aspirin 81 MG tablet Take 81 mg by mouth daily.    Historical Provider, MD  carbamazepine (TEGRETOL) 200 MG tablet Take 200-300 mg by mouth  2 (two) times daily. Take 1 tablet in the morning and 1 1/2 tabs in the evening    Historical Provider, MD  cholecalciferol (VITAMIN D) 1000 UNITS tablet Take 1,000 Units by mouth daily.    Historical Provider, MD  cyanocobalamin (,VITAMIN B-12,) 1000 MCG/ML injection Inject 1,000 mcg into the muscle every 30 (thirty) days.    Historical Provider, MD  docusate sodium (COLACE) 100 MG capsule Take 100 mg by mouth 2 (two) times daily.    Historical Provider, MD  furosemide (LASIX) 20 MG tablet take 1 tablet by mouth once daily 11/02/12   Boykin Nearing, MD  HYDROcodone-acetaminophen (NORCO) 10-325 MG per tablet Take 1 tablet by mouth every 8 (eight) hours as needed for pain. 11/19/12   Josalyn  Funches, MD  losartan (COZAAR) 100 MG tablet Take 100 mg by mouth daily.    Historical Provider, MD  metFORMIN (GLUCOPHAGE) 1000 MG tablet Take 1 tablet (1,000 mg total) by mouth 2 (two) times daily with a meal. 08/16/15   Eileen Stanford, PA-C  metoprolol (LOPRESSOR) 100 MG tablet Take 2 tablets (200 mg total) by mouth 2 (two) times daily. 08/09/12   Josalyn Funches, MD  nitroGLYCERIN (NITROSTAT) 0.4 MG SL tablet Place 0.4 mg under the tongue every 5 (five) minutes as needed. 11/12/11   Josalyn Funches, MD  pantoprazole (PROTONIX) 40 MG tablet Take 40 mg by mouth daily.    Historical Provider, MD  senna (SENOKOT) 8.6 MG tablet Take 2 tablets by mouth at bedtime.     Historical Provider, MD  traZODone (DESYREL) 100 MG tablet Take 150 mg by mouth at bedtime.    Historical Provider, MD   BP 139/56 mmHg  Pulse 66  Temp(Src) 98.9 F (37.2 C) (Oral)  Resp 20  SpO2 97% Physical Exam  Constitutional: He is oriented to person, place, and time. He appears well-developed.  HENT:  Head: Normocephalic and atraumatic.  Eyes: Conjunctivae and EOM are normal. Pupils are equal, round, and reactive to light.  Neck: Normal range of motion. Neck supple.  Cardiovascular: Normal rate, regular rhythm and normal heart sounds.   Pulmonary/Chest: Effort normal and breath sounds normal. No respiratory distress. He has no wheezes.  Abdominal: Soft. Bowel sounds are normal. He exhibits no distension. There is no tenderness. There is no rebound and no guarding.  Musculoskeletal:  L foot is edematous and erythematous. There is pitting. Pt has a 2 cm ulcer at the base of the 5th metatarsal plantar side with no foul smell and serosanguinous drainage.  Neurological: He is alert and oriented to person, place, and time.  Skin: Skin is warm. Rash noted.  Nursing note and vitals reviewed.   ED Course  Procedures (including critical care time) Labs Review Labs Reviewed  CBC WITH DIFFERENTIAL/PLATELET - Abnormal;  Notable for the following:    WBC 13.1 (*)    RBC 3.43 (*)    Hemoglobin 10.4 (*)    HCT 30.8 (*)    Neutro Abs 9.0 (*)    Monocytes Absolute 1.7 (*)    All other components within normal limits  C-REACTIVE PROTEIN - Abnormal; Notable for the following:    CRP 23.4 (*)    All other components within normal limits  I-STAT CHEM 8, ED - Abnormal; Notable for the following:    Chloride 95 (*)    Glucose, Bld 169 (*)    Calcium, Ion 1.07 (*)    Hemoglobin 10.5 (*)    HCT 31.0 (*)  All other components within normal limits  SEDIMENTATION RATE    Imaging Review Dg Foot 2 Views Left  03/27/2016  CLINICAL DATA:  Ulceration at level of proximal fifth metatarsal EXAM: LEFT FOOT - 2 VIEW COMPARISON:  January 29, 2015 FINDINGS: Frontal and lateral views were obtained. There is diffuse edema throughout the foot, most notably lateral to the distal fifth metatarsal. There is focal air in the soft tissues lateral to the distal fifth metatarsal laterally. There is no fracture or dislocation. There is no erosive change or bony destruction evident. The joint spaces appear unremarkable. A small focus of vascular calcification is noted between the proximal first and second metatarsals. There is calcification in the distal Achilles tendon. IMPRESSION: Marked soft tissue swelling. Soft tissue air focally is noted lateral to the distal fifth metatarsal, concerning for soft tissue abscess development in this area. No erosive change or bony destruction evident. No fracture or dislocation. No appreciable erosion or joint space narrowing. From an imaging standpoint, MR pre and post-contrast would be the imaging study of choice to further evaluate if further imaging is felt to be warranted. Electronically Signed   By: Lowella Grip III M.D.   On: 03/27/2016 10:48   I have personally reviewed and evaluated these images and lab results as part of my medical decision-making.   EKG Interpretation None      MDM    Final diagnoses:  Left foot infection    I personally performed the services described in this documentation, which was scribed in my presence. The recorded information has been reviewed and is accurate.  PT comes in with cc of foot infection. He has diabetic foot infection, based on the elevated WC and an erythematous/edematous foot with an ulcer. Osteo is in the consideration. Pt has no systemic signs. Plan is to get MRI, and then defer antibiotics based on the MRI results- i.e. If osteomyelitis, then consult ortho and id before starting antibiotics.    Varney Biles, MD 03/27/16 1215

## 2016-03-28 ENCOUNTER — Encounter (HOSPITAL_COMMUNITY)
Admission: EM | Disposition: A | Discharge status: Home / Self Care | Payer: Self-pay | Source: Home / Self Care | Attending: Oncology

## 2016-03-28 ENCOUNTER — Inpatient Hospital Stay (HOSPITAL_COMMUNITY): Payer: Medicare (Managed Care) | Admitting: Anesthesiology

## 2016-03-28 DIAGNOSIS — E1151 Type 2 diabetes mellitus with diabetic peripheral angiopathy without gangrene: Principal | ICD-10-CM

## 2016-03-28 DIAGNOSIS — M00872 Arthritis due to other bacteria, left ankle and foot: Secondary | ICD-10-CM

## 2016-03-28 DIAGNOSIS — Z79899 Other long term (current) drug therapy: Secondary | ICD-10-CM

## 2016-03-28 DIAGNOSIS — L97529 Non-pressure chronic ulcer of other part of left foot with unspecified severity: Secondary | ICD-10-CM

## 2016-03-28 DIAGNOSIS — Z79891 Long term (current) use of opiate analgesic: Secondary | ICD-10-CM

## 2016-03-28 DIAGNOSIS — G8929 Other chronic pain: Secondary | ICD-10-CM

## 2016-03-28 DIAGNOSIS — B9689 Other specified bacterial agents as the cause of diseases classified elsewhere: Secondary | ICD-10-CM

## 2016-03-28 DIAGNOSIS — E11621 Type 2 diabetes mellitus with foot ulcer: Secondary | ICD-10-CM

## 2016-03-28 DIAGNOSIS — M86172 Other acute osteomyelitis, left ankle and foot: Secondary | ICD-10-CM

## 2016-03-28 DIAGNOSIS — D649 Anemia, unspecified: Secondary | ICD-10-CM

## 2016-03-28 DIAGNOSIS — M109 Gout, unspecified: Secondary | ICD-10-CM

## 2016-03-28 DIAGNOSIS — E1169 Type 2 diabetes mellitus with other specified complication: Secondary | ICD-10-CM

## 2016-03-28 DIAGNOSIS — M545 Low back pain: Secondary | ICD-10-CM

## 2016-03-28 DIAGNOSIS — E1140 Type 2 diabetes mellitus with diabetic neuropathy, unspecified: Secondary | ICD-10-CM

## 2016-03-28 DIAGNOSIS — I10 Essential (primary) hypertension: Secondary | ICD-10-CM

## 2016-03-28 DIAGNOSIS — J449 Chronic obstructive pulmonary disease, unspecified: Secondary | ICD-10-CM

## 2016-03-28 DIAGNOSIS — K219 Gastro-esophageal reflux disease without esophagitis: Secondary | ICD-10-CM

## 2016-03-28 DIAGNOSIS — Z7982 Long term (current) use of aspirin: Secondary | ICD-10-CM

## 2016-03-28 HISTORY — PX: AMPUTATION: SHX166

## 2016-03-28 LAB — BASIC METABOLIC PANEL
ANION GAP: 10 (ref 5–15)
BUN: 13 mg/dL (ref 6–20)
CHLORIDE: 97 mmol/L — AB (ref 101–111)
CO2: 25 mmol/L (ref 22–32)
CREATININE: 1.02 mg/dL (ref 0.61–1.24)
Calcium: 8.1 mg/dL — ABNORMAL LOW (ref 8.9–10.3)
GFR calc non Af Amer: >60 mL/min (ref >60)
Glucose, Bld: 139 mg/dL — ABNORMAL HIGH (ref 65–99)
POTASSIUM: 3.7 mmol/L (ref 3.5–5.1)
SODIUM: 132 mmol/L — AB (ref 135–145)

## 2016-03-28 LAB — CBC
HCT: 30.5 % — ABNORMAL LOW (ref 39.0–52.0)
Hemoglobin: 10.2 g/dL — ABNORMAL LOW (ref 13.0–17.0)
MCH: 30.6 pg (ref 26.0–34.0)
MCHC: 33.4 g/dL (ref 30.0–36.0)
MCV: 91.6 fL (ref 78.0–100.0)
PLATELETS: 178 10*3/uL (ref 150–400)
RBC: 3.33 MIL/uL — AB (ref 4.22–5.81)
RDW: 12.9 % (ref 11.5–15.5)
WBC: 11.9 10*3/uL — AB (ref 4.0–10.5)

## 2016-03-28 LAB — PREALBUMIN: Prealbumin: 13.8 mg/dL — ABNORMAL LOW (ref 18–38)

## 2016-03-28 LAB — GLUCOSE, CAPILLARY
GLUCOSE-CAPILLARY: 112 mg/dL — AB (ref 65–99)
Glucose-Capillary: 165 mg/dL — ABNORMAL HIGH (ref 65–99)
Glucose-Capillary: 136 mg/dL — ABNORMAL HIGH (ref 65–99)
Glucose-Capillary: 186 mg/dL — ABNORMAL HIGH (ref 65–99)

## 2016-03-28 SURGERY — AMPUTATION DIGIT
Anesthesia: General | Laterality: Left

## 2016-03-28 MED ORDER — FENTANYL CITRATE (PF) 100 MCG/2ML IJ SOLN
INTRAMUSCULAR | Status: AC
  Filled 2016-03-28: qty 2

## 2016-03-28 MED ORDER — SUCCINYLCHOLINE CHLORIDE 200 MG/10ML IV SOSY
PREFILLED_SYRINGE | INTRAVENOUS | Status: AC
  Filled 2016-03-28: qty 10

## 2016-03-28 MED ORDER — ONDANSETRON HCL 4 MG/2ML IJ SOLN
INTRAMUSCULAR | Status: DC | PRN
  Administered 2016-03-28: 4 mg via INTRAVENOUS

## 2016-03-28 MED ORDER — PROPOFOL 10 MG/ML IV BOLUS
INTRAVENOUS | Status: DC | PRN
  Administered 2016-03-28: 200 mg via INTRAVENOUS

## 2016-03-28 MED ORDER — SODIUM CHLORIDE 0.9 % IV SOLN
INTRAVENOUS | Status: DC
  Administered 2016-03-28: 21:00:00 via INTRAVENOUS

## 2016-03-28 MED ORDER — FENTANYL CITRATE (PF) 100 MCG/2ML IJ SOLN
25.0000 ug | INTRAMUSCULAR | Status: DC | PRN
  Administered 2016-03-28: 50 ug via INTRAVENOUS
  Filled 2016-03-28: qty 2

## 2016-03-28 MED ORDER — METHOCARBAMOL 1000 MG/10ML IJ SOLN
500.0000 mg | Freq: Four times a day (QID) | INTRAVENOUS | Status: DC | PRN
  Filled 2016-03-28: qty 5

## 2016-03-28 MED ORDER — METOCLOPRAMIDE HCL 5 MG/ML IJ SOLN: 5.0000 mg | Freq: Three times a day (TID) | INTRAMUSCULAR | Status: DC | PRN

## 2016-03-28 MED ORDER — METOCLOPRAMIDE HCL 5 MG PO TABS: 5.0000 mg | ORAL_TABLET | Freq: Three times a day (TID) | ORAL | Status: DC | PRN

## 2016-03-28 MED ORDER — PROPOFOL 10 MG/ML IV BOLUS
INTRAVENOUS | Status: AC
  Filled 2016-03-28: qty 40

## 2016-03-28 MED ORDER — POVIDONE-IODINE 10 % EX SWAB: 2.0000 "application " | Freq: Once | CUTANEOUS | Status: DC

## 2016-03-28 MED ORDER — FENTANYL CITRATE (PF) 250 MCG/5ML IJ SOLN
INTRAMUSCULAR | Status: DC | PRN
  Administered 2016-03-28 (×2): 50 ug via INTRAVENOUS

## 2016-03-28 MED ORDER — ACETAMINOPHEN 325 MG PO TABS: 650.0000 mg | ORAL_TABLET | Freq: Four times a day (QID) | ORAL | Status: DC | PRN

## 2016-03-28 MED ORDER — OXYCODONE HCL 5 MG PO TABS: 5.0000 mg | ORAL_TABLET | ORAL | Status: DC | PRN

## 2016-03-28 MED ORDER — ONDANSETRON HCL 4 MG PO TABS: 4.0000 mg | ORAL_TABLET | Freq: Four times a day (QID) | ORAL | Status: DC | PRN

## 2016-03-28 MED ORDER — ACETAMINOPHEN 650 MG RE SUPP: 650.0000 mg | Freq: Four times a day (QID) | RECTAL | Status: DC | PRN

## 2016-03-28 MED ORDER — METOPROLOL TARTRATE 100 MG PO TABS: 100.0000 mg | ORAL_TABLET | Freq: Two times a day (BID) | ORAL | Status: DC

## 2016-03-28 MED ORDER — FENTANYL CITRATE (PF) 250 MCG/5ML IJ SOLN
INTRAMUSCULAR | Status: AC
  Filled 2016-03-28: qty 5

## 2016-03-28 MED ORDER — ONDANSETRON HCL 4 MG/2ML IJ SOLN
INTRAMUSCULAR | Status: AC
  Filled 2016-03-28: qty 4

## 2016-03-28 MED ORDER — PROMETHAZINE HCL 25 MG/ML IJ SOLN: 6.2500 mg | INTRAMUSCULAR | Status: DC | PRN

## 2016-03-28 MED ORDER — ONDANSETRON HCL 4 MG/2ML IJ SOLN: 4.0000 mg | Freq: Four times a day (QID) | INTRAMUSCULAR | Status: DC | PRN

## 2016-03-28 MED ORDER — METHOCARBAMOL 500 MG PO TABS: 500.0000 mg | ORAL_TABLET | Freq: Four times a day (QID) | ORAL | Status: DC | PRN

## 2016-03-28 MED ORDER — MIDAZOLAM HCL 2 MG/2ML IJ SOLN
INTRAMUSCULAR | Status: AC
  Filled 2016-03-28: qty 2

## 2016-03-28 MED ORDER — HYDROMORPHONE HCL 1 MG/ML IJ SOLN
1.0000 mg | INTRAMUSCULAR | Status: DC | PRN
  Filled 2016-03-28: qty 1

## 2016-03-28 MED ORDER — CLONIDINE HCL 0.1 MG/24HR TD PTWK: 0.1000 mg | MEDICATED_PATCH | TRANSDERMAL | Status: DC

## 2016-03-28 MED ORDER — CHLORHEXIDINE GLUCONATE 4 % EX LIQD
60.0000 mL | Freq: Once | CUTANEOUS | Status: DC
  Filled 2016-03-28: qty 60

## 2016-03-28 MED ORDER — LACTATED RINGERS IV SOLN
INTRAVENOUS | Status: DC
  Administered 2016-03-28: 15:00:00 via INTRAVENOUS

## 2016-03-28 MED ORDER — CLONIDINE HCL 0.1 MG PO TABS
0.1000 mg | ORAL_TABLET | Freq: Once | ORAL | Status: AC
  Administered 2016-03-28: 0.1 mg via ORAL
  Filled 2016-03-28: qty 1

## 2016-03-28 MED ORDER — LIDOCAINE 2% (20 MG/ML) 5 ML SYRINGE
INTRAMUSCULAR | Status: DC | PRN
  Administered 2016-03-28: 60 mg via INTRAVENOUS

## 2016-03-28 MED ORDER — 0.9 % SODIUM CHLORIDE (POUR BTL) OPTIME
TOPICAL | Status: DC | PRN
  Administered 2016-03-28: 1000 mL

## 2016-03-28 MED ORDER — LIDOCAINE 2% (20 MG/ML) 5 ML SYRINGE
INTRAMUSCULAR | Status: AC
  Filled 2016-03-28: qty 5

## 2016-03-28 SURGICAL SUPPLY — 32 items
BLADE SURG 21 STRL SS (BLADE) ×3 IMPLANT
BNDG COHESIVE 4X5 TAN STRL (GAUZE/BANDAGES/DRESSINGS) ×3 IMPLANT
BNDG ESMARK 4X9 LF (GAUZE/BANDAGES/DRESSINGS) IMPLANT
BNDG GAUZE ELAST 4 BULKY (GAUZE/BANDAGES/DRESSINGS) IMPLANT
COVER SURGICAL LIGHT HANDLE (MISCELLANEOUS) ×3 IMPLANT
DRAPE INCISE IOBAN 66X45 STRL (DRAPES) ×3 IMPLANT
DRAPE U-SHAPE 47X51 STRL (DRAPES) ×3 IMPLANT
DRSG ADAPTIC 3X8 NADH LF (GAUZE/BANDAGES/DRESSINGS) IMPLANT
DRSG PAD ABDOMINAL 8X10 ST (GAUZE/BANDAGES/DRESSINGS) ×3 IMPLANT
DRSG VAC ATS MED SENSATRAC (GAUZE/BANDAGES/DRESSINGS) ×3 IMPLANT
DURAPREP 26ML APPLICATOR (WOUND CARE) ×3 IMPLANT
ELECT REM PT RETURN 9FT ADLT (ELECTROSURGICAL) ×3
ELECTRODE REM PT RTRN 9FT ADLT (ELECTROSURGICAL) ×1 IMPLANT
GAUZE SPONGE 4X4 12PLY STRL (GAUZE/BANDAGES/DRESSINGS) IMPLANT
GLOVE BIOGEL PI IND STRL 9 (GLOVE) ×1 IMPLANT
GLOVE BIOGEL PI INDICATOR 9 (GLOVE) ×2
GLOVE SURG ORTHO 9.0 STRL STRW (GLOVE) ×3 IMPLANT
GOWN STRL REUS W/ TWL XL LVL3 (GOWN DISPOSABLE) ×2 IMPLANT
GOWN STRL REUS W/TWL XL LVL3 (GOWN DISPOSABLE) ×6
KIT BASIN OR (CUSTOM PROCEDURE TRAY) ×3 IMPLANT
KIT ROOM TURNOVER OR (KITS) ×3 IMPLANT
MANIFOLD NEPTUNE II (INSTRUMENTS) ×3 IMPLANT
NEEDLE 22X1 1/2 (OR ONLY) (NEEDLE) IMPLANT
NS IRRIG 1000ML POUR BTL (IV SOLUTION) ×3 IMPLANT
PACK ORTHO EXTREMITY (CUSTOM PROCEDURE TRAY) ×3 IMPLANT
PAD ARMBOARD 7.5X6 YLW CONV (MISCELLANEOUS) ×3 IMPLANT
SUCTION FRAZIER HANDLE 10FR (MISCELLANEOUS)
SUCTION TUBE FRAZIER 10FR DISP (MISCELLANEOUS) IMPLANT
SUT ETHILON 2 0 PSLX (SUTURE) ×6 IMPLANT
SYR CONTROL 10ML LL (SYRINGE) IMPLANT
TOWEL OR 17X24 6PK STRL BLUE (TOWEL DISPOSABLE) ×3 IMPLANT
TOWEL OR 17X26 10 PK STRL BLUE (TOWEL DISPOSABLE) ×3 IMPLANT

## 2016-03-28 NOTE — Progress Notes (Signed)
Subjective: Leslie [name]. Denies fevers, chills, nausea, loss of appetite, and SOB. Had a headache this morning that improved with Tylenol.  Ortho saw pt last night and planned for going to the OR today for an amputation of the 4th and 5th metatarsals.  Pt expressed refusal to go to SNF to social work today. Per discussion with PCP Dr. Bradd Burner, Leslie [name] is very independent, strongly prioritizes being at home, and reliably follows up with [facility]. Pt reports going to [facility] every other day for foot care. Dr. Bradd Burner thinks [facility] could help set up home infusion, possibly as early as Monday, if IV antibiotics are needed. Dr. Bradd Burner also noted that Leslie [name] is known to walk on foot wounds when he should not. In the past, putting a cast on the affected foot has helped limit further injury.  Objective: Vital signs in last 24 hours: Filed Vitals:   03/28/16 0828 03/28/16 0939 03/28/16 1054 03/28/16 1215  BP: 194/79 186/71 172/67 165/81  Pulse: 79 76  75  Temp:  98.5 F (36.9 C)    TempSrc:  Oral    Resp: 20 18    Height:      Weight:      SpO2:  100%       Intake/Output Summary (Last 24 hours) at 03/28/16 1316 Last data filed at 03/28/16 0900  Gross per 24 hour  Intake      0 ml  Output   1550 ml  Net  -1550 ml   BP 165/81 mmHg  Pulse 75  Temp(Src) 98.5 F (36.9 C) (Oral)  Resp 18  Ht 5' 6.14" (1.68 m)  Wt 101.8 kg (224 lb 6.9 oz)  BMI 36.07 kg/m2  SpO2 100%  General Appearance:   Alert, cooperative, no distress  Lungs:   Diffuse wheezing. No crackles or rhonchi. No increased work of breathing.  Heart:   Regular rate and rhythm, S1 and S2 normal, no murmur, rub or gallop  Extremities: L foot - Quarter sized lesion on lateral/plantar side of foot near distal 5th metatarsal. Dried blood at site of lesion. Surrounding erythema over distal lateral dorsum of foot stretching to mid dorsum. 0.5 cm round areas of dry skin on dorsum of foot (look like blisters but dry skin on closer  examination). Erythema does not appear to have extended. Drying peeling skin on planar surface of foot. R foot normal appearing without lesions or cyanosis.   Skin:  See above  Neurologic:  Able to move toes           Lab Results:  Recent Labs Lab 03/27/16 1100 03/27/16 1113 03/28/16 0251  WBC 13.1*  --  11.9*  HGB 10.4* 10.5* 10.2*  HCT 30.8* 31.0* 30.5*  PLT 187  --  178    Recent Labs Lab 03/27/16 1113 03/28/16 0251  NA 135 132*  K 4.0 3.7  CL 95* 97*  CO2  --  25  BUN 16 13  CREATININE 0.90 1.02  CALCIUM  --  8.1*  GLUCOSE 169* 139*    Medications: I have reviewed the patient's current medications. Scheduled Meds: . allopurinol  300 mg Oral Daily  . amLODipine  10 mg Oral Daily  . aspirin EC  81 mg Oral Daily  . carbamazepine  200 mg Oral Daily  . carbamazepine  300 mg Oral QHS  . cefTRIAXone (ROCEPHIN)  IV  2 g Intravenous Q24H   And  . metronidazole  500 mg Intravenous Q8H  . enoxaparin (LOVENOX) injection  0.5 mg/kg Subcutaneous Q24H  . insulin aspart  0-9 Units Subcutaneous TID WC  . losartan  100 mg Oral Daily  . pantoprazole  40 mg Oral Daily  . senna-docusate  1 tablet Oral QHS  . 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  # Osteomyelitis in 5th metatarsal head, septic arthritis in 5th MTP joint, surrounding cellulitis Shown on MRI. Differential also includes gout. Less likely given prophylactic allopurinol and no recent gout attacks. Mild systemic sx including nausea and weakness on day prior to admission. Elevated WBC to 13.9, ESR, and CRP. Afebrile and vitals stable. Given bone involvement and no systemic evidence of infection, this is a Moderate Diabetic Foot Infection. Antibiotic therapy (vanc, ceftriaxone, and Flagyl) started 03/27/16 prior to amputation given concern for worsening infection and ortho had not yet seen patient. At risk for MRSA given wound care in last 30 days and failed other antibiotics (though may be 2/2 to bone involvement). Nasal swab negative for MRSA in past.  - Ortho to amputate 4th and 5th metatarsals today - Continue IV antibiotics (ceftriaxone + metronidazole + vancomycin). Started 03/27/16. Continue parenteral broad coverage for 2-5 days following amputation if no remaining infected bone. If persistent infected or necrotic bone, continue antibiotics for 4 weeks. Narrow coverage based on bone culture. Bone cultures may be not grow any bacteria due to starting antibiotics prior to surgery. IV antibiotics may be needed to provide adequate MSSA, enterobacter, and anaerobe coverage (ceftriaxone or Unasyn). Fluoroquinolones, such as levo and moxi, may also be considered and can be dosed po. For MRSA coverage, vancomycin is the preferred agent. Patient was recently on failed course of Bactrim, doxy, and Keflex. - Wound care consulted  # Non-insulin dependent DM, neuropathy A1c 6.9% in 2014. - SSI - Hold home metformin 1000 mg BID - Continue home tegretol 270m qdaily and 300 mg qHS - Recheck A1c  #Peripheral arterial disease - Hold home ASA 879mas patient may go to OR soon for debridement  #Hypertension SBP up to 180s-190s overnight and this AM. Likely 2/2 rebound to clonidine as  he has not been wearing his clonidine patch for a few days. - Continue home amlodipine 1051mdaily - Continue losartan 100m36maily - One-time dose of clonidine 0.1mg 52may to control BP for surgery. Restart weekly clonidine patch after surgery or tomorrow morning. Will avoid clonidine patch during surgery in case patient were to become hypotensive and staff may not realize pt wearing patch. - Hold metoprolol 100 mg BID - Hold Lasix 20mg 68mly. Re-evaluate volume status.  Stable Chronic Conditions # Chronic pain  - Continue home Norco 325-10mg q21mrn.  - Start Sennakot #HLD - Hold home Zetia 10mg qd58m #GERD - Continue home pantoprazole 40 mg qdaily  #Gout - Continue allopurinol 300mg qda62m#COPD/asthma -  Continue home albuterol neb q6hr prn. Per pt, he doesn't use any inhalers  #VTE Prophylaxis - Started on Lovenox   #FEN/GI - No IVF at this time. - Carb modified diet. Restart after going to OR.  #Dispo - PCP (Dr. Bradd Burner) available at [phone] - office # - Pt refuses d/c to SNF. If IV antibiotics needed, work with [facility] to set up home infusions. Regularly foot care by [facility] clinic and is strong outpatient support. - Lives independently - Defer to [name] [[phone]], patient's friend, if patients lacks decision-making capacity  This is a Careers information officer Note.  The care of the patient was discussed with Dr. Charlott Rakes and the assessment and plan formulated with their assistance.  Please see their attached note for official documentation of the daily encounter.   LOS: 1 day   Kateri Plummer, Med Student 03/28/2016, 1:16 PM

## 2016-03-28 NOTE — Consult Note (Signed)
ORTHOPAEDIC CONSULTATION  REQUESTING PHYSICIAN: Annia Belt, MD  Chief complaint: Abscess osteomyelitis left foot fifth ray  HPI: Leslie [name] is a 70 y.o. male who presents with cellulitis with a draining ulcer left foot fifth metatarsal head. Patient states that he has had several infections in the left foot with recurrence. Patient complains of painful cellulitis and draining abscess. Patient has peripheral vascular disease diabetic insensate neuropathy.  Past Medical History  Diagnosis Date  . Asthma   . COPD (chronic obstructive pulmonary disease) (HCC)     Uses nebulizers at home. Former smoker, current chewing tobacco. 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  Marland Kitchen GERD (gastroesophageal reflux disease)   . Hyperlipidemia   . Hypertension   . Substance abuse   . Peripheral neuropathy ([place]) 07/01/2012  . Type II diabetes mellitus ([place]) 2002    Diagnosed in 2002  . Gouty arthritis 07/01/2012    Not attacks in ~5 yrs (03/2016)  . Chronic lower back pain 07/01/2012  . Skin growth 07/01/2012    right side of nares; "been on there 20 years"  . Hepatitis 1967    "in jail when I got it; dr said it was pretty bad; quaranteened X 30d"  . Cellulitis of right foot 06/30/2012  . Peripheral arterial disease ([place])     critical limb ischemia  . Lung nodule, solitary 07/2015    Due for 1-yr recheck in 07/2017  . Cellulitis of left foot 2016    Healed and recurred 01/2016   Past Surgical History  Procedure Laterality Date  . Lower extremity angiogram N/A 04/18/2013    Procedure: LOWER EXTREMITY ANGIOGRAM;  Surgeon: Sherren Mocha, MD;  Location: [facility] CATH LAB;  Service: Cardiovascular;  Laterality: N/A;  . Lower extremity angiogram  08/13/2015    bilateral iliac   . Peripheral vascular catheterization Bilateral 08/13/2015    Procedure: Lower Extremity Angiography;  Surgeon: Lorretta Harp, MD;  Location: [place] CV LAB;  Service: Cardiovascular;  Laterality: Bilateral;  . Peripheral vascular  catheterization N/A 08/13/2015    Procedure: Abdominal Aortogram;  Surgeon: Lorretta Harp, MD;  Location: [place] CV LAB;  Service: Cardiovascular;  Laterality: N/A;   Social History   Social History  . Marital Status: Widowed    Spouse Name: N/A  . Number of Children: N/A  . Years of Education: N/A   Occupational History  . disabled    Social History Main Topics  . Smoking status: Former Smoker -- 2.00 packs/day for 40 years    Types: Cigarettes    Quit date: 01/13/2002  . Smokeless tobacco: Current User    Types: Chew  . Alcohol Use: 5.4 oz/week    9 Cans of beer per week     Comment: 3 beers/night on some nights. Previously 6-pack/night.  . Drug Use: No     Comment: 03/27/2016 "last marijuana was maybe in 2013"  . Sexual Activity: No   Other Topics Concern  . 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   Lives in assisted living. Performs ADLs and IADLs, including cooking and managing his medications. Friend drives him to the store. Uses rollator walker at home and motorized scooter in store.   [facility] patient   Widowed   5-8 years education   Likes to fish   Pets:  4 cockatiels and 2 lovebirds   Family History  Problem Relation Age of Onset  . Diabetes Sister   .  Hypertension Mother    - negative except otherwise stated in the family history section Allergies  Allergen Reactions  . Flexeril [Cyclobenzaprine] Other (See Comments)    Dry mouth and hallucinations.    Prior to Admission medications   Medication Sig Start Date End Date Taking? Authorizing Provider  allopurinol (ZYLOPRIM) 300 MG tablet take 1 tablet by mouth once daily 11/02/12   Josalyn Funches, MD  amLODipine (NORVASC) 10 MG tablet Take 1 tablet (10 mg total) by mouth daily. 08/09/12   Boykin Nearing, MD  aspirin 81 MG tablet Take 81 mg by mouth daily.    Historical Provider, MD  carbamazepine (TEGRETOL) 200 MG tablet Take  200-300 mg by mouth 2 (two) times daily. Take 1 tablet in the morning and 1 1/2 tabs in the evening    Historical Provider, MD  cholecalciferol (VITAMIN D) 1000 UNITS tablet Take 1,000 Units by mouth daily.    Historical Provider, MD  cyanocobalamin (,VITAMIN B-12,) 1000 MCG/ML injection Inject 1,000 mcg into the muscle every 30 (thirty) days.    Historical Provider, MD  docusate sodium (COLACE) 100 MG capsule Take 100 mg by mouth 2 (two) times daily.    Historical Provider, MD  furosemide (LASIX) 20 MG tablet take 1 tablet by mouth once daily 11/02/12   Boykin Nearing, MD  HYDROcodone-acetaminophen (NORCO) 10-325 MG per tablet Take 1 tablet by mouth every 8 (eight) hours as needed for pain. 11/19/12   Josalyn Funches, MD  losartan (COZAAR) 100 MG tablet Take 100 mg by mouth daily.    Historical Provider, MD  metFORMIN (GLUCOPHAGE) 1000 MG tablet Take 1 tablet (1,000 mg total) by mouth 2 (two) times daily with a meal. 08/16/15   Eileen Stanford, PA-C  metoprolol (LOPRESSOR) 100 MG tablet Take 2 tablets (200 mg total) by mouth 2 (two) times daily. 08/09/12   Josalyn Funches, MD  nitroGLYCERIN (NITROSTAT) 0.4 MG SL tablet Place 0.4 mg under the tongue every 5 (five) minutes as needed. 11/12/11   Josalyn Funches, MD  pantoprazole (PROTONIX) 40 MG tablet Take 40 mg by mouth daily.    Historical Provider, MD  senna (SENOKOT) 8.6 MG tablet Take 2 tablets by mouth at bedtime.     Historical Provider, MD  traZODone (DESYREL) 100 MG tablet Take 150 mg by mouth at bedtime.    Historical Provider, MD   Mr Foot Left W Wo Contrast  03/27/2016  CLINICAL DATA:  Left foot redness with a open wound over the fifth digit EXAM: MRI OF THE LEFT FOREFOOT WITHOUT AND WITH CONTRAST TECHNIQUE: Multiplanar, multisequence MR imaging was performed both before and after administration of intravenous contrast. CONTRAST:  52mL MULTIHANCE GADOBENATE DIMEGLUMINE 529 MG/ML IV SOLN COMPARISON:  None. FINDINGS: Bones/Joint/Cartilage Soft  tissue wound over the lateral aspect of the fifth metatarsal head. Surrounding soft tissue edema and enhancement. Soft tissue abnormality extends down to the cortex of the fifth metatarsal head. Severe marrow edema in the fifth metatarsal head with cortical destruction along the plantar aspect most consistent with osteomyelitis. Moderate joint effusion of the fifth MTP joint with marrow edema in the adjacent fifth proximal phalanx most concerning for septic arthritis. No other marrow signal abnormality. No fracture or dislocation. Normal alignment. Collaterals Collateral ligaments are intact. Tendons Flexor and extensor compartment tendons are intact. Muscles Mild increased T2 signal throughout the plantar musculature likely neurogenic given the patient's history of diabetes. Soft tissue No soft tissue mass. No other fluid collection or hematoma. Generalized soft tissue swelling of  the left foot. IMPRESSION: 1. Soft tissue wound over the lateral aspect of the fifth metatarsal with surrounding cellulitis and cellulitis involving the dorsal aspect of the left foot. Osteomyelitis of the fifth metatarsal head and septic arthritis of the fifth MTP joint. Electronically Signed   By: Kathreen Devoid   On: 03/27/2016 14:21   Dg Foot 2 Views Left  03/27/2016  CLINICAL DATA:  Ulceration at level of proximal fifth metatarsal EXAM: LEFT FOOT - 2 VIEW COMPARISON:  January 29, 2015 FINDINGS: Frontal and lateral views were obtained. There is diffuse edema throughout the foot, most notably lateral to the distal fifth metatarsal. There is focal air in the soft tissues lateral to the distal fifth metatarsal laterally. There is no fracture or dislocation. There is no erosive change or bony destruction evident. The joint spaces appear unremarkable. A small focus of vascular calcification is noted between the proximal first and second metatarsals. There is calcification in the distal Achilles tendon. IMPRESSION: Marked soft tissue swelling.  Soft tissue air focally is noted lateral to the distal fifth metatarsal, concerning for soft tissue abscess development in this area. No erosive change or bony destruction evident. No fracture or dislocation. No appreciable erosion or joint space narrowing. From an imaging standpoint, MR pre and post-contrast would be the imaging study of choice to further evaluate if further imaging is felt to be warranted. Electronically Signed   By: Lowella Grip III M.D.   On: 03/27/2016 10:48   - pertinent xrays, CT, MRI studies were reviewed and independently interpreted  Positive ROS: All other systems have been reviewed and were otherwise negative with the exception of those mentioned in the HPI and as above.  Physical Exam: General: Alert, no acute distress Cardiovascular: No pedal edema Respiratory: No cyanosis, no use of accessory musculature GI: No organomegaly, abdomen is soft and non-tender Skin: Cellulitis involving the left forefoot and midfoot with a draining ulcer beneath the fifth metatarsal head. Neurologic: Patient does not have protective sensation. Psychiatric: Patient is competent for consent with normal mood and affect Lymphatic: No axillary or cervical lymphadenopathy  MUSCULOSKELETAL:  On examination patient has a palpable dorsalis pedis pulse the MRI scan is reviewed which shows abscess involving the fifth metatarsal extending to the fourth metatarsal radiographs shows no destructive bony changes.  Assessment: Assessment: Osteomyelitis ulceration left foot fifth and fourth ray.  Plan: Plan: We'll plan for left foot fifth ray and possible fourth ray amputation. Risk and benefits were discussed including recurrent infections potential for higher level amputation. Risk of the wound not healing. Patient states he understands wishes to proceed at this time.  Thank you for the consult and the opportunity to see Mr. Augusta Propson, [place] [phone] 6:31  AM

## 2016-03-28 NOTE — Progress Notes (Signed)
Report given to RN in short stay. Awaiting transport to get patient.

## 2016-03-28 NOTE — Progress Notes (Signed)
Subjective: This morning, he denies complaints, like fever, nausea, vomiting, though is hungry as he is awaiting surgical intervention today.   Objective: Vital signs in last 24 hours: Filed Vitals:   03/28/16 0429 03/28/16 0828 03/28/16 0939 03/28/16 1054  BP: 183/67 194/79 186/71 172/67  Pulse: 77 79 76   Temp: 98.3 F (36.8 C)  98.5 F (36.9 C)   TempSrc: Oral  Oral   Resp: 19 20 18    Height:      Weight:      SpO2: 97%  100%    Weight change:   Intake/Output Summary (Last 24 hours) at 03/28/16 1213 Last data filed at 03/28/16 0900  Gross per 24 hour  Intake      0 ml  Output   1550 ml  Net  -1550 ml   General: obese Caucasian male, resting in bed, NAD HEENT: PERRL, EOMI, no scleral icterus  Cardiac: RRR, no rubs, murmurs or gallops Pulm: bilateral wheezing on inspiration, no wheezes, rales, or rhonchi Abd: soft, nontender, nondistended, BS present Ext: warm and well perfused, L foot with bandage covering 1-2 cm lesion noted on the lateral aspect of the distal 5th metatarsal though clean. Neuro: responds to questions appropriately; moving all extremities freely  Lab Results: Basic Metabolic Panel:  Recent Labs Lab 03/27/16 1113 03/28/16 0251  NA 135 132*  K 4.0 3.7  CL 95* 97*  CO2  --  25  GLUCOSE 169* 139*  BUN 16 13  CREATININE 0.90 1.02  CALCIUM  --  8.1*   CBC:  Recent Labs Lab 03/27/16 1100 03/27/16 1113 03/28/16 0251  WBC 13.1*  --  11.9*  NEUTROABS 9.0*  --   --   HGB 10.4* 10.5* 10.2*  HCT 30.8* 31.0* 30.5*  MCV 89.8  --  91.6  PLT 187  --  178   CBG:  Recent Labs Lab 03/27/16 2226 03/28/16 0717 03/28/16 1200  GLUCAP 180* 165* 136*   Studies/Results: Mr Foot Left W Wo Contrast  03/27/2016  CLINICAL DATA:  Left foot redness with a open wound over the fifth digit EXAM: MRI OF THE LEFT FOREFOOT WITHOUT AND WITH CONTRAST TECHNIQUE: Multiplanar, multisequence MR imaging was performed both before and after administration of  intravenous contrast. CONTRAST:  20mL MULTIHANCE GADOBENATE DIMEGLUMINE 529 MG/ML IV SOLN COMPARISON:  None. FINDINGS: Bones/Joint/Cartilage Soft tissue wound over the lateral aspect of the fifth metatarsal head. Surrounding soft tissue edema and enhancement. Soft tissue abnormality extends down to the cortex of the fifth metatarsal head. Severe marrow edema in the fifth metatarsal head with cortical destruction along the plantar aspect most consistent with osteomyelitis. Moderate joint effusion of the fifth MTP joint with marrow edema in the adjacent fifth proximal phalanx most concerning for septic arthritis. No other marrow signal abnormality. No fracture or dislocation. Normal alignment. Collaterals Collateral ligaments are intact. Tendons Flexor and extensor compartment tendons are intact. Muscles Mild increased T2 signal throughout the plantar musculature likely neurogenic given the patient's history of diabetes. Soft tissue No soft tissue mass. No other fluid collection or hematoma. Generalized soft tissue swelling of the left foot. IMPRESSION: 1. Soft tissue wound over the lateral aspect of the fifth metatarsal with surrounding cellulitis and cellulitis involving the dorsal aspect of the left foot. Osteomyelitis of the fifth metatarsal head and septic arthritis of the fifth MTP joint. Electronically Signed   By: Kathreen Devoid   On: 03/27/2016 14:21   Dg Foot 2 Views Left  03/27/2016  CLINICAL  DATA:  Ulceration at level of proximal fifth metatarsal EXAM: LEFT FOOT - 2 VIEW COMPARISON:  January 29, 2015 FINDINGS: Frontal and lateral views were obtained. There is diffuse edema throughout the foot, most notably lateral to the distal fifth metatarsal. There is focal air in the soft tissues lateral to the distal fifth metatarsal laterally. There is no fracture or dislocation. There is no erosive change or bony destruction evident. The joint spaces appear unremarkable. A small focus of vascular calcification is noted  between the proximal first and second metatarsals. There is calcification in the distal Achilles tendon. IMPRESSION: Marked soft tissue swelling. Soft tissue air focally is noted lateral to the distal fifth metatarsal, concerning for soft tissue abscess development in this area. No erosive change or bony destruction evident. No fracture or dislocation. No appreciable erosion or joint space narrowing. From an imaging standpoint, MR pre and post-contrast would be the imaging study of choice to further evaluate if further imaging is felt to be warranted. Electronically Signed   By: Lowella Grip III M.D.   On: 03/27/2016 10:48   Medications: I have reviewed the patient's current medications. Scheduled Meds: . allopurinol  300 mg Oral Daily  . amLODipine  10 mg Oral Daily  . aspirin EC  81 mg Oral Daily  . carbamazepine  200 mg Oral Daily  . carbamazepine  300 mg Oral QHS  . cefTRIAXone (ROCEPHIN)  IV  2 g Intravenous Q24H   And  . metronidazole  500 mg Intravenous Q8H  . enoxaparin (LOVENOX) injection  0.5 mg/kg Subcutaneous Q24H  . insulin aspart  0-9 Units Subcutaneous TID WC  . losartan  100 mg Oral Daily  . pantoprazole  40 mg Oral Daily  . senna-docusate  1 tablet Oral QHS  . 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 Yoon is a 70 year old male with severe peripheral arterial disease, non-insulin-dependent type 2 diabetes obligated by peripheral neuropathy, hypertension, chronic gout, COPD hospitalized for left foot osteomyelitis complicated by septic  arthritis.  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 Vital signs stable overnight. Operative management today.  -Continue ceftriaxone, metronidazole, vancomycin IV per foot ulcer order set -Wound Care & Ortho following, appreciate recommendations  Non-insulin-dependent type 2 diabetes complicated by neuropathy: Last A1c in system 6.9, 2014. -Continue sensitive sliding scale insulin -Recheck A1c -Continue home Tegretol 200 mg daily and 300 mg at bedtime for neuropathy  Peripheral arterial disease: Continue aspirin 81 mg. Holding Zetia for now.  Chronic lower back pain on narcotics: Continue Norco 10/325 every 8 hours as needed for low back pain.  Hypertension: Blood pressure elevated 160s-180s on admission. Unclear if he is supposed to be on metoprolol 200 mg twice daily or Lasix 20 mg daily.  Also has clonidine 0.1mg  patch/week at home which we will give oral now to avoid dropping his BP. -Continue amlodipine 10 mg daily -Continue losartan 100 mg daily -Give clonidine 0.1mg  x 1 now with plan to resume patch -Holding Lasix, metoprolol  GERD: Continue pantoprazole 40 mg daily  COPD: Continue albuterol nebulizers every  6 hours as needed for wheezing  Gout: Continue allopurinol 300mg  daily.  #FEN:  -Diet: Carb Modified  #DVT prophylaxis: Lovenox  #CODE STATUS: DNR/DNI -Defer to [name] [phone]] if patients lacks decision-making capacity -Confirmed with patient on admission  Dispo: Disposition is deferred at this time, awaiting improvement of current medical problems.    The patient does have a current PCP [name], MD) and does not need an [facility] hospital follow-up appointment after discharge.  The patient does not know have transportation limitations that hinder transportation to clinic appointments.  .Services Needed at time of discharge: Y = Yes, Blank = No PT:   OT:   RN:   Equipment:   Other:     LOS: 1  day   Riccardo Dubin, MD 03/28/2016, 12:13 PM

## 2016-03-28 NOTE — Progress Notes (Signed)
Patient's blood pressure was elevated this morning (194/79)- morning blood pressure medication given. On reassessment, b/p is 186/71. MD paged for further interventions.

## 2016-03-28 NOTE — Op Note (Signed)
03/27/2016 - 03/28/2016  4:40 PM  PATIENT:  [name]    PRE-OPERATIVE DIAGNOSIS:  osteomylitis  POST-OPERATIVE DIAGNOSIS:  Same  PROCEDURE:  AMPUTATION left foot 4th and 5 th ray Local tissue rearrangement for wound closure 4 x 8 cm. Application of Prevena wound VAC  SURGEON:  Suheyb Raucci V, MD  PHYSICIAN ASSISTANT:None ANESTHESIA:   General  PREOPERATIVE INDICATIONS:  TOCHUKWU BUTRON is a  70 y.o. male with a diagnosis of osteomylitis who failed conservative measures and elected for surgical management.    The risks benefits and alternatives were discussed with the patient preoperatively including but not limited to the risks of infection, bleeding, nerve injury, cardiopulmonary complications, the need for revision surgery, among others, and the patient was willing to proceed.  OPERATIVE IMPLANTS: Prevena wound VAC  OPERATIVE FINDINGS: no deep abscess good petechial bleeding  OPERATIVE PROCEDURE: patient brought the operating room and underwent a general anesthetic. After adequate levels anesthesia obtained patient's left lower extremity was prepped using DuraPrep draped into a sterile field a timeout was called. An elliptical incision was made around the ulcerative tissue as well as around the fourth and fifth rays. The necrotic tissue abscess and fourth and fifth rays were resected in 1 block of tissue. The base of the metatarsals showed no signs of infection. The soft tissue remaining no sign of infection. The wound was irrigated with normal saline. Local tissue rearrangement was used to perform wound closure  Which was 4 x 8 cm. A Prevena wound VAC was applied patient was extubated taken to the PACU in stable condition.

## 2016-03-28 NOTE — Anesthesia Postprocedure Evaluation (Signed)
Anesthesia Post Note  Patient: Leslie [name]  Procedure(s) Performed: Procedure(s) (LRB): AMPUTATION left foot 4th and 5 th ray (Left)  Patient location during evaluation: PACU Anesthesia Type: General Level of consciousness: awake and alert Pain management: pain level controlled Vital Signs Assessment: post-procedure vital signs reviewed and stable Respiratory status: spontaneous breathing, nonlabored ventilation, respiratory function stable and patient connected to nasal cannula oxygen Cardiovascular status: blood pressure returned to baseline and stable Postop Assessment: no signs of nausea or vomiting Anesthetic complications: no    Last Vitals:  Filed Vitals:   03/28/16 1054 03/28/16 1215  BP: 172/67 165/81  Pulse:  75  Temp:    Resp:      Last Pain:  Filed Vitals:   03/28/16 1638  PainSc: 4                  Rmani Kapusta S

## 2016-03-28 NOTE — Transfer of Care (Signed)
Immediate Anesthesia Transfer of Care Note  Patient: Leslie [name]  Procedure(s) Performed: Procedure(s): AMPUTATION left foot 4th and 5 th ray (Left)  Patient Location: PACU  Anesthesia Type:General  Level of Consciousness: awake, alert , oriented and patient cooperative  Airway & Oxygen Therapy: Patient Spontanous Breathing and Patient connected to face mask oxygen  Post-op Assessment: Report given to RN, Post -op Vital signs reviewed and stable and Patient moving all extremities X 4  Post vital signs: Reviewed and stable  Last Vitals:  Filed Vitals:   03/28/16 1054 03/28/16 1215  BP: 172/67 165/81  Pulse:  75  Temp:    Resp:      Last Pain:  Filed Vitals:   03/28/16 1638  PainSc: 4       Patients Stated Pain Goal: 0 ([id number])  Complications: No apparent anesthesia complications

## 2016-03-28 NOTE — Anesthesia Procedure Notes (Signed)
Procedure Name: LMA Insertion Date/Time: 03/28/2016 4:03 PM Performed by: Layla Maw Pre-anesthesia Checklist: Patient identified, Patient being monitored, Timeout performed, Emergency Drugs available and Suction available Patient Re-evaluated:Patient Re-evaluated prior to inductionOxygen Delivery Method: Circle System Utilized Preoxygenation: Pre-oxygenation with 100% oxygen Intubation Type: IV induction Ventilation: Mask ventilation without difficulty LMA: LMA inserted LMA Size: 5.0 Number of attempts: 1 Placement Confirmation: positive ETCO2 and breath sounds checked- equal and bilateral Tube secured with: Tape Dental Injury: Teeth and Oropharynx as per pre-operative assessment

## 2016-03-28 NOTE — Clinical Social Work Note (Signed)
LCSW spoke with [facility] CSW regarding patient's discharge disposition. Per [facility] CS[facility], patient is refusing SNF placement at time of discharge. Patient requesting to be discharged home once medically stable.  LCSW signing off.  Lubertha Sayres, [facility]: [phone] Surgical: [phone]

## 2016-03-28 NOTE — Anesthesia Preprocedure Evaluation (Addendum)
Anesthesia Evaluation  Patient identified by MRN, date of birth, ID band Patient awake    Reviewed: Allergy & Precautions, H&P , NPO status , Patient's Chart, lab work & pertinent test results  History of Anesthesia Complications Negative for: history of anesthetic complications  Airway Mallampati: II  TM Distance: >3 FB Neck ROM: full    Dental no notable dental hx.    Pulmonary asthma , COPD, former smoker,    Pulmonary exam normal breath sounds clear to auscultation       Cardiovascular hypertension, Pt. on medications Normal cardiovascular exam Rhythm:regular Rate:Normal     Neuro/Psych negative neurological ROS  negative psych ROS   GI/Hepatic Neg liver ROS, GERD  ,  Endo/Other  diabetes, Poorly Controlled  Renal/GU negative Renal ROS  negative genitourinary   Musculoskeletal negative musculoskeletal ROS (+) Arthritis ,   Abdominal Normal abdominal exam  (+)   Peds negative pediatric ROS (+)  Hematology negative hematology ROS (+)   Anesthesia Other Findings   Reproductive/Obstetrics negative OB ROS                            Anesthesia Physical Anesthesia Plan  ASA: III  Anesthesia Plan: General   Post-op Pain Management:    Induction: Intravenous  Airway Management Planned: LMA  Additional Equipment:   Intra-op Plan:   Post-operative Plan:   Informed Consent: I have reviewed the patients History and Physical, chart, labs and discussed the procedure including the risks, benefits and alternatives for the proposed anesthesia with the patient or authorized representative who has indicated his/her understanding and acceptance.   Dental Advisory Given  Plan Discussed with: Anesthesiologist, CRNA and Surgeon  Anesthesia Plan Comments:         Anesthesia Quick Evaluation

## 2016-03-28 NOTE — Consult Note (Addendum)
WOC consult requested for foot wound.  Dr Sharol Given in earlier to assess site and plans to perform surgery, according to the EMR. Please refer to ortho service for further questions regarding this location. Please re-consult if further assistance is needed.  Thank-you,  Julien Girt MSN, [place], [place], [place], [place]

## 2016-03-29 LAB — GLUCOSE, CAPILLARY
Glucose-Capillary: 161 mg/dL — ABNORMAL HIGH (ref 65–99)
Glucose-Capillary: 229 mg/dL — ABNORMAL HIGH (ref 65–99)
Glucose-Capillary: 248 mg/dL — ABNORMAL HIGH (ref 65–99)

## 2016-03-29 LAB — CBC
HCT: 30.9 % — ABNORMAL LOW (ref 39.0–52.0)
Hemoglobin: 10.5 g/dL — ABNORMAL LOW (ref 13.0–17.0)
MCH: 30.5 pg (ref 26.0–34.0)
MCHC: 34 g/dL (ref 30.0–36.0)
MCV: 89.8 fL (ref 78.0–100.0)
PLATELETS: 202 10*3/uL (ref 150–400)
RBC: 3.44 MIL/uL — AB (ref 4.22–5.81)
RDW: 12.9 % (ref 11.5–15.5)
WBC: 7.1 10*3/uL (ref 4.0–10.5)

## 2016-03-29 LAB — HEMOGLOBIN A1C
HEMOGLOBIN A1C: 7.2 % — AB (ref 4.8–5.6)
MEAN PLASMA GLUCOSE: 160 mg/dL

## 2016-03-29 LAB — BASIC METABOLIC PANEL
Anion gap: 11 (ref 5–15)
BUN: 10 mg/dL (ref 6–20)
CALCIUM: 8.3 mg/dL — AB (ref 8.9–10.3)
CHLORIDE: 95 mmol/L — AB (ref 101–111)
CO2: 28 mmol/L (ref 22–32)
CREATININE: 0.96 mg/dL (ref 0.61–1.24)
GFR calc non Af Amer: >60 mL/min (ref >60)
GLUCOSE: 159 mg/dL — AB (ref 65–99)
Potassium: 4 mmol/L (ref 3.5–5.1)
Sodium: 134 mmol/L — ABNORMAL LOW (ref 135–145)

## 2016-03-29 MED ORDER — TERBINAFINE HCL 1 % EX CREA
TOPICAL_CREAM | Freq: Two times a day (BID) | CUTANEOUS | Status: DC
  Filled 2016-03-29 (×2): qty 12

## 2016-03-29 MED ORDER — LACTATED RINGERS IV SOLN
INTRAVENOUS | Status: AC
Start: 2016-03-28 — End: 2016-03-28

## 2016-03-29 NOTE — Progress Notes (Addendum)
Subjective: This morning, he is requesting to go home. He reports tolerating the procedure well yesterday and has had minimal pain. I observed him ambulate from the bathroom back to the bed. I explained to him that is. not in agreement with the recommendations provided to him by his orthopedist and could lead to further complications of his left lower extremity to which he expressed understanding. He reports he has a follow-up appointment with [facility] on Monday.  Objective: Vital signs in last 24 hours: Filed Vitals:   03/28/16 1645 03/28/16 2037 03/29/16 0000 03/29/16 0515  BP:  161/57 178/73 155/49  Pulse:  81 79 76  Temp:  98.1 F (36.7 C) 98.2 F (36.8 C) 98 F (36.7 C)  TempSrc:  Oral Oral Oral  Resp:  18 18 18   Height:      Weight:      SpO2: 95% 96% 98% 98%   Weight change:   Intake/Output Summary (Last 24 hours) at 03/29/16 1107 Last data filed at 03/29/16 0833  Gross per 24 hour  Intake    540 ml  Output    550 ml  Net    -10 ml   General: obese Caucasian male, resting in bed, NAD HEENT: PERRL, EOMI, no scleral icterus  Cardiac: RRR, no rubs, murmurs or gallops Pulm: bilateral wheezing on inspiration, no wheezes, rales, or rhonchi Abd: soft, nontender, nondistended, BS present Ext: warm and well perfused, L foot affixed to wound vac that is suctioning blood.  Neuro: responds to questions appropriately; moving all extremities freely  Lab Results: Basic Metabolic Panel:  Recent Labs Lab 03/28/16 0251 03/29/16 0631  NA 132* 134*  K 3.7 4.0  CL 97* 95*  CO2 25 28  GLUCOSE 139* 159*  BUN 13 10  CREATININE 1.02 0.96  CALCIUM 8.1* 8.3*   CBC:  Recent Labs Lab 03/27/16 1100  03/28/16 0251 03/29/16 0631  WBC 13.1*  --  11.9* 7.1  NEUTROABS 9.0*  --   --   --   HGB 10.4*  < > 10.2* 10.5*  HCT 30.8*  < > 30.5* 30.9*  MCV 89.8  --  91.6 89.8  PLT 187  --  178 202  < > = values in this interval not displayed. CBG:  Recent Labs Lab 03/27/16 2226  03/28/16 0717 03/28/16 1200 03/28/16 1451 03/28/16 2054 03/29/16 0635  GLUCAP 180* 165* 136* 112* 186* 161*   Studies/Results: Mr Foot Left W Wo Contrast  03/27/2016  CLINICAL DATA:  Left foot redness with a open wound over the fifth digit EXAM: MRI OF THE LEFT FOREFOOT WITHOUT AND WITH CONTRAST TECHNIQUE: Multiplanar, multisequence MR imaging was performed both before and after administration of intravenous contrast. CONTRAST:  40mL MULTIHANCE GADOBENATE DIMEGLUMINE 529 MG/ML IV SOLN COMPARISON:  None. FINDINGS: Bones/Joint/Cartilage Soft tissue wound over the lateral aspect of the fifth metatarsal head. Surrounding soft tissue edema and enhancement. Soft tissue abnormality extends down to the cortex of the fifth metatarsal head. Severe marrow edema in the fifth metatarsal head with cortical destruction along the plantar aspect most consistent with osteomyelitis. Moderate joint effusion of the fifth MTP joint with marrow edema in the adjacent fifth proximal phalanx most concerning for septic arthritis. No other marrow signal abnormality. No fracture or dislocation. Normal alignment. Collaterals Collateral ligaments are intact. Tendons Flexor and extensor compartment tendons are intact. Muscles Mild increased T2 signal throughout the plantar musculature likely neurogenic given the patient's history of diabetes. Soft tissue No soft tissue mass. No  other fluid collection or hematoma. Generalized soft tissue swelling of the left foot. IMPRESSION: 1. Soft tissue wound over the lateral aspect of the fifth metatarsal with surrounding cellulitis and cellulitis involving the dorsal aspect of the left foot. Osteomyelitis of the fifth metatarsal head and septic arthritis of the fifth MTP joint. Electronically Signed   By: Kathreen Devoid   On: 03/27/2016 14:21   Medications: I have reviewed the patient's current medications. Scheduled Meds: . allopurinol  300 mg Oral Daily  . amLODipine  10 mg Oral Daily  .  aspirin EC  81 mg Oral Daily  . carbamazepine  200 mg Oral Daily  . carbamazepine  300 mg Oral QHS  . cefTRIAXone (ROCEPHIN)  IV  2 g Intravenous Q24H   And  . metronidazole  500 mg Intravenous Q8H  . [START ON 03/31/2016] cloNIDine  0.1 mg Transdermal Weekly  . enoxaparin (LOVENOX) injection  0.5 mg/kg Subcutaneous Q24H  . insulin aspart  0-9 Units Subcutaneous TID WC  . losartan  100 mg Oral Daily  . pantoprazole  40 mg Oral Daily  . senna-docusate  1 tablet Oral QHS  . terbinafine   Topical BID  . vancomycin  750 mg Intravenous Q12H   Continuous Infusions: . sodium chloride 10 mL/hr at 03/28/16 2105  . lactated ringers 50 mL/hr at 03/28/16 1453   PRN Meds:.acetaminophen **OR** acetaminophen, albuterol, fentaNYL (SUBLIMAZE) injection, HYDROcodone-acetaminophen, HYDROmorphone (DILAUDID) injection, methocarbamol **OR** methocarbamol (ROBAXIN)  IV, metoCLOPramide **OR** metoCLOPramide (REGLAN) injection, ondansetron **OR** ondansetron (ZOFRAN) IV, oxyCODONE, promethazine, traZODone Assessment/Plan: Principal Problem:   Septic arthritis of left foot (HCC) Active Problems:   Diabetic neuropathy (HCC)   HLD (hyperlipidemia)   OBESITY   HTN (hypertension)   COPD (chronic obstructive pulmonary disease) (HCC)   GERD   Chronic lower back pain on narcotics    Peripheral arterial disease (HCC)   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 is a 70 year old male with severe peripheral arterial disease, non-insulin-dependent type 2 diabetes obligated by peripheral neuropathy, hypertension, chronic gout, COPD hospitalized for left foot osteomyelitis complicated by septic arthritis s/p transmetatarsal amputation now post operative day one.  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 He will require another 24 hours of IV antibiotics in addition to source control.  -Continue  ceftriaxone, metronidazole, vancomycin IV for today -Reiterated nonweightbearing status of the left lower extremity. Expressed concern about his ability to take care of himself as he lives independently though he says his girlfriend will come to provide assistance. -Follow-up PT/OT recommendations -Ortho following, appreciate recommendations  Non-insulin-dependent type 2 diabetes complicated by neuropathy: A1c 7.2 on this admission. -Continue sensitive sliding scale insulin -Continue home Tegretol 200 mg daily and 300 mg at bedtime for neuropathy  Peripheral arterial disease: Continue aspirin 81 mg. Holding Zetia for now.  Chronic lower back pain on narcotics: Continue Norco 10/325 every 6 hours as needed for low back pain.  Hypertension: Blood pressure elevated 160s-180s on admission. Unclear if he is supposed to be on metoprolol 200 mg twice daily or Lasix 20 mg daily.   -Continue amlodipine 10 mg daily -Continue losartan 100 mg daily -Continue clonidine 0.1mg  patch/week -Holding Lasix, metoprolol  GERD: Continue pantoprazole 40 mg daily  COPD: Continue albuterol nebulizers every 6 hours as needed for wheezing  Gout: Continue allopurinol 300mg  daily.  #FEN:  -Diet: Carb Modified  #DVT prophylaxis: Lovenox  #CODE STATUS: DNR/DNI -Defer to [name] [  [phone]] if patients lacks decision-making capacity -Confirmed with patient on admission  Dispo: Disposition is deferred at this time, awaiting improvement of current medical problems.    The patient does have a current PCP [name], MD) and does not need an [facility] hospital follow-up appointment after discharge.  The patient does not know have transportation limitations that hinder transportation to clinic appointments.  .Services Needed at time of discharge: Y = Yes, Blank = No PT:   OT:   RN:   Equipment:   Other:     LOS: 2 days   Riccardo Dubin, MD 03/29/2016, 11:07 AM

## 2016-03-29 NOTE — Progress Notes (Signed)
Patient ID: Leslie [name], male   DOB: [birth year], 70 y.o.   MRN: [id number] Postoperative day 1 status post fourth and fifth ray amputations left foot. The wound VAC is functioning well. Patient is safe for discharge to home he needs to be nonweightbearing on the left foot I will follow-up in the office in 1 week. I discussed with the patient if the wound VAC begins beeping that means that it is no longer working and the dressing and wound VAC needs to be removed and changed with a dry dressing. I will follow-up in the office in 1 week.

## 2016-03-29 NOTE — Evaluation (Signed)
Physical Therapy Evaluation Patient Details Name: Leslie [name] MRN: [id number] DOB: 1946/08/25 Today's Date: 03/29/2016   History of Present Illness  70 y.o. 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  PMH significant for DM, COPD, gout, PVD and peripheral neuropathy  Clinical Impression  Patient modified independent with all mobility, using DARCO shoe and NWB forefoot.  Feel patient safe for d/c home from PT standpoint when medically ready.      Follow Up Recommendations No PT follow up    Equipment Recommendations  None recommended by PT    Recommendations for Other Services       Precautions / Restrictions Restrictions Weight Bearing Restrictions: Yes LLE Weight Bearing: Non weight bearing Other Position/Activity Restrictions: patient has DARCO shoe for weight bearing through heel      Mobility  Bed Mobility Overal bed mobility: Independent                Transfers Overall transfer level: Modified independent Equipment used: Rolling walker (2 wheeled)                Ambulation/Gait Ambulation/Gait assistance: Modified independent (Device/Increase time) Ambulation Distance (Feet): 100 Feet Assistive device: Rolling walker (2 wheeled) Gait Pattern/deviations: Step-to pattern;Decreased stance time - left;Decreased weight shift to left;Antalgic Gait velocity: decreased   General Gait Details: overall safe with ambulation using DARCO shoe and NWB forefoot  Stairs            Wheelchair Mobility    Modified Rankin (Stroke Patients Only)       Balance Overall balance assessment: Needs assistance Sitting-balance support: No upper extremity supported Sitting balance-Leahy Scale: Normal     Standing balance support: Bilateral upper extremity supported Standing balance-Leahy Scale: Poor Standing balance comment: used RW for balance                              Pertinent Vitals/Pain Pain Assessment: No/denies pain    Home Living Family/patient expects to be discharged to:: Other (Comment) (independent living center) Living Arrangements: Alone Available Help at Discharge: Friend(s);Available PRN/intermittently Type of Home: Independent living facility Home Access: Level entry     Home Layout: One level Home Equipment: Walker - 4 wheels Additional Comments: scooter used for transportation out of apt    Prior Function Level of Independence: Independent               Hand Dominance        Extremity/Trunk Assessment               Lower Extremity Assessment: Overall WFL for tasks assessed         Communication   Communication: No difficulties  Cognition Arousal/Alertness: Awake/alert Behavior During Therapy: Impulsive Overall Cognitive Status: Within Functional Limits for tasks assessed                      General Comments      Exercises        Assessment/Plan    PT Assessment All further PT needs can be met in the next venue of care;Patent does not need any further PT services  PT Diagnosis Difficulty walking   PT Problem List    PT Treatment Interventions     PT Goals (Current goals can be found in the Care Plan section) Acute Rehab PT Goals PT Goal Formulation: All assessment  and education complete, DC therapy    Frequency     Barriers to discharge        Co-evaluation               End of Session Equipment Utilized During Treatment: Gait belt Activity Tolerance: Patient tolerated treatment well Patient left: in chair;with call bell/phone within reach           Time: 1155-1210 PT Time Calculation (min) (ACUTE ONLY): 15 min   Charges:   PT Evaluation $PT Eval Low Complexity: 1 Procedure     PT G CodesShanna [name] 03/29/2016, 12:21 PM  03/29/2016 Leslie [name], PT [phone]

## 2016-03-29 NOTE — Progress Notes (Deleted)
Patient ID: Leslie [name], male   DOB: [birth year], 69 y.o.   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 Patient's cultures were sensitive to  kefzol. Thank you for infectious disease consultation. Interoperative cultures are pending. Plan for discharge to home on Sunday if safe with therapy, arrangements made for home health IV antibiotics for 6 weeks, and home health therapy.

## 2016-03-29 NOTE — Clinical Social Work Note (Signed)
CSW met with Pt and Pt indicated that he is involved with [facility], and plans to DC home at time of discharge. Pt wants [facility] to provide services at home. Please re-consult CSW if CSW assistance needed. Pt expressed feelings of worry and being anxious about pets at home, but shared that friend is helping him care for pets while he is in the hospital.   Gracy Bruins MSW, [place]

## 2016-03-29 NOTE — Progress Notes (Addendum)
Patient's Prevena wound vac noted malfunctioned during PT. RN and charge RN unable to maneuver and fix. RN called [employer] and spoke to Page and tech unable to assist at this time. She verbalized for RN to switch from [place] wound vac therapy to regular [facility] Wound Vac Therapy at this time. No other issues noted from current vac. Will continue to monitor.   Ave Filter, RN

## 2016-03-29 NOTE — Progress Notes (Signed)
Orthopedic Tech Progress Note Patient Details:  Leslie [name] 10-Apr-1946 [id number]  Ortho Devices Type of Ortho Device: Postop shoe/boot Ortho Device/Splint Interventions: Application   [place] Pink 03/29/2016, 4:01 PM

## 2016-03-30 DIAGNOSIS — Z89422 Acquired absence of other left toe(s): Secondary | ICD-10-CM

## 2016-03-30 LAB — CBC
HEMATOCRIT: 30.3 % — AB (ref 39.0–52.0)
Hemoglobin: 10.3 g/dL — ABNORMAL LOW (ref 13.0–17.0)
MCH: 30.2 pg (ref 26.0–34.0)
MCHC: 34 g/dL (ref 30.0–36.0)
MCV: 88.9 fL (ref 78.0–100.0)
PLATELETS: 194 10*3/uL (ref 150–400)
RBC: 3.41 MIL/uL — AB (ref 4.22–5.81)
RDW: 12.7 % (ref 11.5–15.5)
WBC: 7.3 10*3/uL (ref 4.0–10.5)

## 2016-03-30 LAB — GLUCOSE, CAPILLARY
GLUCOSE-CAPILLARY: 202 mg/dL — AB (ref 65–99)
GLUCOSE-CAPILLARY: 238 mg/dL — AB (ref 65–99)
Glucose-Capillary: 180 mg/dL — ABNORMAL HIGH (ref 65–99)
Glucose-Capillary: 228 mg/dL — ABNORMAL HIGH (ref 65–99)

## 2016-03-30 MED ORDER — CLONIDINE HCL 0.1 MG/24HR TD PTWK
0.1000 mg | MEDICATED_PATCH | TRANSDERMAL | Status: DC
  Administered 2016-03-30: 0.1 mg via TRANSDERMAL
  Filled 2016-03-30: qty 1

## 2016-03-30 NOTE — Progress Notes (Signed)
Subjective: This morning, he is requesting to go home today. He provided [place] with the phone number for his nurse from [place] , Darrelyn Hillock. I spoke with her who told me she would need at least 24 hours to ensure home health services could be established.  He is otherwise doing well without pain or signs of active infection, like fever, chills, nausea, vomiting.  Objective: Vital signs in last 24 hours: Filed Vitals:   03/29/16 0515 03/29/16 1112 03/29/16 1945 03/30/16 0545  BP: 155/49 161/65 179/69 204/79  Pulse: 76 80 80 87  Temp: 98 F (36.7 C) 98.4 F (36.9 C) 98.1 F (36.7 C) 98.1 F (36.7 C)  TempSrc: Oral Oral Oral Oral  Resp: 18 20 18 16   Height:      Weight:      SpO2: 98% 98% 96% 96%   Weight change:   Intake/Output Summary (Last 24 hours) at 03/30/16 1040 Last data filed at 03/30/16 1025  Gross per 24 hour  Intake    840 ml  Output   1300 ml  Net   -460 ml   General: obese Caucasian male, resting in bed, NAD HEENT: PERRL, EOMI, no scleral icterus  Cardiac: RRR, no rubs, murmurs or gallops Pulm: bilateral wheezing on inspiration, no wheezes, rales, or rhonchi Abd: soft, nontender, nondistended, BS present Ext: warm and well perfused, L foot affixed to wound vac with active suctioning  Neuro: responds to questions appropriately; moving all extremities freely  Lab Results: Basic Metabolic Panel:  Recent Labs Lab 03/28/16 0251 03/29/16 0631  NA 132* 134*  K 3.7 4.0  CL 97* 95*  CO2 25 28  GLUCOSE 139* 159*  BUN 13 10  CREATININE 1.02 0.96  CALCIUM 8.1* 8.3*   CBC:  Recent Labs Lab 03/27/16 1100  03/29/16 0631 03/30/16 0527  WBC 13.1*  < > 7.1 7.3  NEUTROABS 9.0*  --   --   --   HGB 10.4*  < > 10.5* 10.3*  HCT 30.8*  < > 30.9* 30.3*  MCV 89.8  < > 89.8 88.9  PLT 187  < > 202 194  < > = values in this interval not displayed. CBG:  Recent Labs Lab 03/28/16 1451 03/28/16 2054 03/29/16 0635 03/29/16 1127 03/29/16 1624 03/30/16 0911    GLUCAP 112* 186* 161* 229* 248* 228*   Studies/Results: No results found. Medications: I have reviewed the patient's current medications. Scheduled Meds: . allopurinol  300 mg Oral Daily  . amLODipine  10 mg Oral Daily  . aspirin EC  81 mg Oral Daily  . carbamazepine  200 mg Oral Daily  . carbamazepine  300 mg Oral QHS  . [START ON 03/31/2016] cloNIDine  0.1 mg Transdermal Weekly  . enoxaparin (LOVENOX) injection  0.5 mg/kg Subcutaneous Q24H  . insulin aspart  0-9 Units Subcutaneous TID WC  . losartan  100 mg Oral Daily  . pantoprazole  40 mg Oral Daily  . senna-docusate  1 tablet Oral QHS  . terbinafine   Topical BID   Continuous Infusions: . 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 He has completed all antibiotic therapy. PT not recommending follow-up. -Awaiting establishment of home health services per [facility]. Per orthopedist, ambulation on  LLE could result in subsequent complications.  Non-insulin-dependent type 2 diabetes complicated by neuropathy: A1c 7.2 on this admission. -Continue sensitive sliding scale insulin -Continue home Tegretol 200 mg daily and 300 mg at bedtime for neuropathy  Peripheral arterial disease: Continue aspirin 81 mg. Holding Zetia for now.  Chronic lower back pain on narcotics: Continue Norco 10/325 every 6 hours as needed for low back pain.  Hypertension: Blood pressure trending 160s to 200s/60s to 70s. Unclear if he is supposed to be on metoprolol 200 mg twice daily or Lasix 20 mg daily.   -Continue amlodipine 10 mg daily -Continue losartan 100 mg daily -Start clonidine 0.1mg  patch/week today -Holding Lasix, metoprolol  GERD: Continue pantoprazole 40 mg daily  COPD: Continue albuterol nebulizers every 6 hours as needed for wheezing  Gout: Continue allopurinol 300mg  daily.  #FEN:  -Diet: Carb Modified  #DVT prophylaxis: Lovenox  #CODE STATUS: DNR/DNI -Defer to [name] [phone]] if patients lacks decision-making capacity -Confirmed with patient on admission  Dispo: Disposition is deferred at this time, awaiting improvement of current medical problems.    The patient does have a current PCP [name], MD) and does not need an [facility] hospital follow-up appointment after discharge.  The patient does not know have transportation limitations that hinder transportation to clinic appointments.  .Services Needed at time of discharge: Y = Yes, Blank = No PT:   OT:   RN:   Equipment:   Other:     LOS: 3 days   Leslie Dubin, MD 03/30/2016, 10:40 AM

## 2016-03-30 NOTE — Progress Notes (Signed)
Patient ID: Leslie [name], male   DOB: 1946/04/05, 70 y.o.   MRN: [id number] Medicine attending discharge note: I personally examined this patient on the day of discharge and I attest to the accuracy of the discharge evaluation and plan as recorded in the progress note written by resident physician Dr. Charlott Rakes.  Clinical summary: 70 year old man with type 2 diabetes controlled with diet, obstructive airway disease, essential hypertension, gout, and peripheral artery disease. He presented on 03/27/2016 with a 6 week history of a nonhealing ulcer at the base of the fifth metatarsal on the left foot. No fever or chills. Progressive changes and development of pain and cellulitis despite close attention as an outpatient and multiple courses of oral antibiotics. He was referred to the wound clinic. He was evaluated by orthopedic surgery. Concern re-possibility of osteomyelitis. Admission recommended for further evaluation.  Hospital course: X-rays of the left foot showed marked soft tissue swelling. Focal air noted in the soft tissues lateral to the distal fifth metatarsal concerning for abscess formation. No bony changes seen on this study but an MRI scan was consistent with osteomyelitis of the fifth metatarsal head and septic arthritis of the fifth MTP joint. White blood count moderately elevated at 13,000 with 69% neutrophils. He was started on parenteral antibiotics. He was seen in consultation by orthopedic surgery. He was taken to surgery on June 2 for a fourth and fifth ray amputation of the left foot. Application of a wound VAC. He tolerated the procedure well. He remained afebrile for the entire hospital course. With antibiotics and surgery, white count normalized and was 7100 on June 3. Preop hemoglobin 10.4 with MCV 90. CBC on day prior to discharge June 3 with hemoglobin 10.5.  No other acute issues identified. He had a minor localized fungal rash thenar eminence area right hand. Treated with  antifungal.  Disposition: Condition stable at time of discharge He adamantly refused to go to a rehabilitation facility. Home nursing visits will be arranged. [facility] will [facility] the [facility]. There were no complications.

## 2016-03-31 ENCOUNTER — Encounter (HOSPITAL_COMMUNITY): Payer: Self-pay | Admitting: Orthopedic Surgery

## 2016-03-31 DIAGNOSIS — Z87891 Personal history of nicotine dependence: Secondary | ICD-10-CM | POA: Insufficient documentation

## 2016-03-31 DIAGNOSIS — M869 Osteomyelitis, unspecified: Secondary | ICD-10-CM | POA: Insufficient documentation

## 2016-03-31 LAB — GLUCOSE, CAPILLARY: Glucose-Capillary: 174 mg/dL — ABNORMAL HIGH (ref 65–99)

## 2016-03-31 MED ORDER — METOPROLOL TARTRATE 100 MG PO TABS: 100.0000 mg | ORAL_TABLET | Freq: Two times a day (BID) | ORAL | Status: DC

## 2016-03-31 MED ORDER — METOPROLOL TARTRATE 100 MG PO TABS
200.0000 mg | ORAL_TABLET | Freq: Two times a day (BID) | ORAL | Status: DC
  Administered 2016-03-31: 200 mg via ORAL
  Filled 2016-03-31: qty 2

## 2016-03-31 MED ORDER — POLYETHYLENE GLYCOL 3350 17 G PO PACK: 17.0000 g | PACK | Freq: Every day | ORAL | Status: DC

## 2016-03-31 NOTE — Progress Notes (Signed)
Occupational Therapy Evaluation   03/29/16 1500  OT Visit Information  Last OT Received On 03/29/16  Assistance Needed +1  History of Present Illness 70 y.o. 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  PMH significant for DM, COPD, gout, PVD and peripheral neuropathy  Precautions  Precaution Comments cued for WB status during mobility  Restrictions  Weight Bearing Restrictions Yes  LLE Weight Bearing NWB  Other Position/Activity Restrictions patient has DARCO shoe for weight bearing through heel  Home Living  Family/patient expects to be discharged to: Other (Comment) ([place])  Living Arrangements Alone  Available Help at Discharge Friend(s);Available PRN/intermittently;Other (Comment) (Pt mentioned [place] nurse.)  Type of Home Independent living facility  Home Access Level entry  [place] One level  [place] - 4 wheels;Shower seat  Additional Comments scooter used for transportation out of apt. tub shower.  Prior Function  Level of Independence Independent  Communication  Communication No difficulties  Pain Assessment  Pain Assessment 0-10  Pain Score 4  Pain Location L foot  Pain Descriptors / Indicators Sore  Pain Intervention(s) Limited activity within patient's tolerance;Monitored during session;Repositioned  Cognition  Arousal/Alertness Awake/alert  Behavior During Therapy Impulsive  Overall Cognitive Status Within Functional Limits for tasks assessed  Upper Extremity Assessment  Upper Extremity Assessment Overall WFL for tasks assessed  Lower Extremity Assessment  Lower Extremity Assessment Defer to PT evaluation  ADL  Overall ADL's  Needs assistance/impaired  Eating/Feeding Set up;Sitting  Grooming Set up;Sitting  Upper Body Bathing Set up;Sitting  Lower Body Bathing Min guard;Sit to/from stand  Upper Body Dressing  Set up;Sitting   Lower Body Dressing Min guard;Sit to/from Arboriculturist;Ambulation;Comfort height toilet;Grab bars;RW  Toileting- Clothing Manipulation and Hygiene Set up;Sitting/lateral lean  Tub/ Shower Transfer Tub transfer;Minimal assistance;Ambulation;Shower Development worker, community Details (indicate cue type and reason) Advised to have someone with him when attempting showers at home. Pt states he will have a nurse with him for showers at home.   Functional mobility during ADLs Supervision/safety;Rolling walker  General ADL Comments Pt completed in-room functional mobility. Declined toilet transfer. Cue needed initially for WB status during ambulation. Discussed LB dressing technique.   Bed Mobility  General bed mobility comments in recliner  Transfers  Overall transfer level Modified independent  Equipment used Rolling walker (2 wheeled)  Balance  Overall balance assessment Needs assistance  Sitting-balance support No upper extremity supported  Sitting balance-Leahy Scale Normal  Standing balance support Bilateral upper extremity supported  Standing balance-Leahy Scale Poor  Standing balance comment rw for balance  OT - End of Session  Equipment Utilized During Treatment Gait belt;Rolling walker  Activity Tolerance Patient tolerated treatment well  Patient left in chair;with call bell/phone within reach  OT Assessment  OT Therapy Diagnosis  Acute pain  OT Recommendation/Assessment Patient needs continued OT Services  OT Problem List Impaired balance (sitting and/or standing);Decreased knowledge of use of DME or AE;Decreased knowledge of precautions;Pain  Barriers to Discharge Comments Home alone with intermittent assist  OT Plan  OT Frequency (ACUTE ONLY) Min 2X/week  OT Treatment/Interventions (ACUTE ONLY) Self-care/ADL training;DME and/or AE instruction;Therapeutic activities;Patient/family education;Balance training  OT Recommendation  Follow Up  Recommendations No OT follow up  OT Equipment None recommended by OT  Individuals Consulted  Consulted and Agree with Results and Recommendations Patient  Acute Rehab OT Goals  Patient Stated Goal  not stated  OT Goal Formulation With patient  Time For Goal Achievement 04/05/16  Potential to Achieve Goals Good  OT Time Calculation  OT Start Time (ACUTE ONLY) 1347  OT Stop Time (ACUTE ONLY) 1356  OT Time Calculation (min) 9 min  OT General Charges  $OT Visit 1 Procedure  OT Evaluation  $OT Eval Low Complexity 1 Procedure   Late entry for Tyrone Schimke OTR/L on 03/29/2016 by Cullars Majestic [phone]

## 2016-03-31 NOTE — Progress Notes (Signed)
Occupational Therapy Treatment Patient Details Name: Leslie [name] MRN: [id number] DOB: [birth year] Today's Date: 03/31/2016    History of present illness 70 y.o. 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  PMH significant for DM, COPD, gout, PVD and peripheral neuropathy   OT comments  Pt. Able to complete bed mobility and pivot transfers this session.  States pain is well managed and is eager for d/c home later today.  Reports he will have assistance from [facility] for all of his d/c needs.  Follow Up Recommendations  No OT follow up    Equipment Recommendations  None recommended by OT    Recommendations for Other Services      Precautions / Restrictions         Mobility Bed Mobility Overal bed mobility: Independent                Transfers Overall transfer level: Modified independent Equipment used: Rolling walker (2 wheeled)             General transfer comment: intermittent cues for maintaining NWB status    Balance                                   ADL Overall ADL's : Needs assistance/impaired                         Toilet Transfer: Supervision/safety;RW;Stand-pivot Armed forces technical officer Details (indicate cue type and reason): requires cues for maintaining NWB with inital sit/stand Toileting- Clothing Manipulation and Hygiene: Set up;Sitting/lateral lean Toileting - Clothing Manipulation Details (indicate cue type and reason): simulated during pivot in room   Tub/Shower Transfer Details (indicate cue type and reason): pt. reports [facility] is providing caregivers and nurses to assist with his adls Functional mobility during ADLs: Supervision/safety;Rolling walker        Vision                     Perception     Praxis      Cognition   Behavior During Therapy: Impulsive Overall Cognitive Status: Within Functional Limits for tasks assessed                        Extremity/Trunk Assessment               Exercises     Shoulder Instructions       General Comments      Pertinent Vitals/ Pain       Pain Assessment: 0-10 Pain Score: 4  Pain Location: L foot Pain Descriptors / Indicators: Sore;Throbbing Pain Intervention(s): Limited activity within patient's tolerance;Repositioned (pt. requesting meds for a headache)  Home Living                                          Prior Functioning/Environment              Frequency Min 2X/week     Progress Toward Goals  OT Goals(current goals can now be found in the care plan section)  Progress towards OT goals: Progressing toward goals     Plan Discharge plan remains appropriate    Co-evaluation  End of Session Equipment Utilized During Treatment: Gait belt;Rolling walker   Activity Tolerance Patient tolerated treatment well   Patient Left in chair;with call bell/phone within reach   Nurse Communication          Time: 1000-1010 OT Time Calculation (min): 10 min  Charges: OT General Charges $OT Visit: 1 Procedure OT Treatments $Self Care/Home Management : 8-22 mins  Janice Coffin, COTA/L 03/31/2016, 10:21 AM

## 2016-03-31 NOTE — Progress Notes (Signed)
Subjective: Post-op day 3. Leslie [name]. Reports feeling well and having a good appetite. Some throat pain which pt attributes to the breathing tube during surgery. Denies fever, nausea, vomiting, diarrhea, headache, SOB, pain, and increased limb swelling. Despite denying fever, has a cold washcloth on his forehead because he "feels warm." Required no pain medication beyond is regular q8h Norco used for chronic pain (used 2x yesterday). Required 9u of SSI yesterday.  Per discussion with [facility] nurse Darrelyn Hillock [phone]), [facility] transportation can be called at any time today to pick-up patient. Home health services are ready.  Objective: Vital signs in last 24 hours: Filed Vitals:   03/30/16 0545 03/30/16 1439 03/30/16 2032 03/31/16 0430  BP: 204/79 183/76 154/86 196/82  Pulse: 87 83 82 87  Temp: 98.1 F (36.7 C) 98.5 F (36.9 C) 98.5 F (36.9 C) 97.7 F (36.5 C)  TempSrc: Oral Oral Oral Oral  Resp: 16 17 16 17   Height:      Weight:      SpO2: 96% 97% 97% 90%   Weight change:   Intake/Output Summary (Last 24 hours) at 03/31/16 [id number] Last data filed at 03/30/16 1600  Gross per 24 hour  Intake    720 ml  Output   1500 ml  Net   -780 ml   General Appearance:  Alert, cooperative, no distress. Sitting up eating breakfast  Lungs:   Diffuse inspiratory wheezing. Reduced breath sounds over R lower posterior lung field. Slightly increased work of breathing.  Heart:   Regular rate and rhythm, S1 and S2 normal, no murmur, rub or gallop  Extremities: Wound vac on L foot. Feet warm and acyanotic. Trace pretibial pitting edema in RLE. 1+ pitting edema to knee in LLE. 1+ peripheral pulses bilaterally.              Lab Results:  Recent Labs Lab 03/28/16 0251 03/29/16 0631 03/30/16 0527  WBC 11.9* 7.1 7.3  HGB 10.2* 10.5* 10.3*  HCT 30.5* 30.9* 30.3*  PLT 178 202 194    Recent Labs Lab 03/27/16 1113 03/28/16 0251 03/29/16 0631  NA 135 132* 134*  K 4.0  3.7 4.0  CL 95* 97* 95*  CO2  --  25 28  BUN 16 13 10   CREATININE 0.90 1.02 0.96  CALCIUM  --  8.1* 8.3*  GLUCOSE 169* 139* 159*   Medications: I have reviewed the patient's current medications. Scheduled Meds: . allopurinol  300 mg Oral Daily  . amLODipine  10 mg Oral Daily  . aspirin EC  81 mg Oral Daily  . carbamazepine  200 mg Oral Daily  . carbamazepine  300 mg Oral QHS  . cloNIDine  0.1 mg Transdermal Weekly  . enoxaparin (LOVENOX) injection  0.5 mg/kg Subcutaneous Q24H  . insulin aspart  0-9 Units Subcutaneous TID WC  . losartan  100 mg Oral Daily  . metoprolol  200 mg Oral BID  . pantoprazole  40 mg Oral Daily  . senna-docusate  1 tablet Oral QHS   Continuous Infusions: . sodium chloride 10 mL/hr at 03/28/16 2105   PRN Meds:.acetaminophen **OR** acetaminophen, albuterol, HYDROcodone-acetaminophen, ondansetron **OR** ondansetron (ZOFRAN) IV, traZODone Assessment/Plan: Principal Problem:   Septic arthritis of left foot (HCC) Active Problems:   Diabetic neuropathy (HCC)   HLD (hyperlipidemia)   OBESITY   HTN (hypertension)   COPD (chronic obstructive pulmonary disease) (HCC)   GERD   Chronic lower back pain on narcotics    Peripheral arterial disease (HCC)   Diabetic osteomyelitis (  [facility])    Leslie [name] is a 70yo male and [facility] patient with a PMH significant for severe PAD, non-insulin-dependent DM with peripheral neuropathy, gout, HTN, COPD, and chronic pain who was found to have osteomyelitis and septic arthritis on MRI after failing outpatient antibiotic therapy. Post-op day 3 after transmetatarsal amputation.  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 Pt remains afebrile, has no systemic symptoms, and WBC has downtrended from 13.9 to 7.3. Source control achieved 03/28/16 with amputation performed by orthopedic surgery. Received IV antibiotics 03/27/16-03/29/16 with  vanc, ceftriaxone, and Flagyl. LLE swelling likely 2/2 to postoperative edema. - Wound vac in place until follow-up with orthopedic surgery in ~1 wk.  # Non-insulin dependent DM, neuropathy A1c 7.2% - SSI (required 9u aspart in last day) - Hold home metformin 1000 mg BID - Continue home tegretol 200mg  qdaily and 300 mg qHS  #Peripheral arterial disease - Continue home ASA 81mg  (initially held for amputation)  #Hypertension SBP up to 180s-190s intermittently throughout hospitalization. Initially held clonidine patch as patient was not wearing one at admission. Restarted patch 6/4. Restarted metoprolol 200mg  BID 6/5 given persistently elevated BPs. Initially thought to be 2/2 rebound to not wearing clonidine patch. May be primary HTN as we have been holding some of his anti-hypertensives. - Continue home amlodipine 10mg  qdaily - Continue home losartan 100mg  qdaily - Continue home clonidine patch 0.1mg  qweekly - Start home metoprolol 200mg  BID - Hold Lasix 20mg  qdaily. Given pt does not appear hypervolemic.  Stable Chronic Conditions # Chronic pain  - Continue home Norco 325-10mg  q8h prn.  - Start [place] #HLD - Hold home Zetia 10mg  qdaily #GERD - Continue home pantoprazole 40 mg qdaily  #Gout - Continue allopurinol 300mg  qdaily #COPD/asthma - Continue home albuterol neb q6hr prn. Per pt, he doesn't use any inhalers  #VTE Prophylaxis - Started on Lovenox   #FEN/GI - No IVF at this time. - Carb modified diet. Restart after going to OR.  #Dispo - PCP (Dr. Bradd Burner) available at [phone] - office # - [facility] Nurse Darrelyn Hillock - [phone] - D/c to home. [facility] assisted in setting up home health services. - Lives independently - Defer to [name] [phone]], patient's friend, if patients lacks decision-making capacity  This is a Careers information officer Note.  The care of the patient was discussed with Dr. Charlott Rakes and the assessment and plan formulated with their  assistance.  Please see their attached note for official documentation of the daily encounter.   LOS: 4 days   Kateri Plummer, Med Student 03/31/2016, 8:12 AM

## 2016-03-31 NOTE — Progress Notes (Signed)
Spoke with [facility] to advise that this pt would be discharging from hospital today and would need follow up home care with their services. Advised that pt also has a follow up appt with Dr. Sharol Given on 04/08/16 at 3:45 PM  As well as a wound care appt on 04-02-16 at 8:30 am

## 2016-03-31 NOTE — Care Management Important Message (Signed)
Important Message  Patient Details  Name: Leslie [name] MRN: [id number] Date of Birth: 01-09-1946   Medicare Important Message Given:  Yes    Loann Quill 03/31/2016, 12:02 PM

## 2016-03-31 NOTE — Progress Notes (Signed)
Subjective: This morning, he is requesting to go home. He does feel a bit constipated and acknowledges that he has medication at home. He does not report any fever or chills.  Objective: Vital signs in last 24 hours: Filed Vitals:   03/30/16 0545 03/30/16 1439 03/30/16 2032 03/31/16 0430  BP: 204/79 183/76 154/86 196/82  Pulse: 87 83 82 87  Temp: 98.1 F (36.7 C) 98.5 F (36.9 C) 98.5 F (36.9 C) 97.7 F (36.5 C)  TempSrc: Oral Oral Oral Oral  Resp: 16 17 16 17   Height:      Weight:      SpO2: 96% 97% 97% 90%   Weight change:   Intake/Output Summary (Last 24 hours) at 03/31/16 0838 Last data filed at 03/30/16 1600  Gross per 24 hour  Intake    720 ml  Output   1500 ml  Net   -780 ml   General: obese Caucasian male, resting in bed, NAD HEENT: PERRL, EOMI, no scleral icterus  Cardiac: RRR, no rubs, murmurs or gallops Pulm: bilateral wheezing on inspiration R>L but no rales, or rhonchi Abd: soft, nontender, nondistended, BS present Ext: warm and well perfused, L foot affixed to wound vac with active suctioning [minimal bloody output] Neuro: responds to questions appropriately; moving all extremities freely  Lab Results: Basic Metabolic Panel:  Recent Labs Lab 03/28/16 0251 03/29/16 0631  NA 132* 134*  K 3.7 4.0  CL 97* 95*  CO2 25 28  GLUCOSE 139* 159*  BUN 13 10  CREATININE 1.02 0.96  CALCIUM 8.1* 8.3*   CBC:  Recent Labs Lab 03/27/16 1100  03/29/16 0631 03/30/16 0527  WBC 13.1*  < > 7.1 7.3  NEUTROABS 9.0*  --   --   --   HGB 10.4*  < > 10.5* 10.3*  HCT 30.8*  < > 30.9* 30.3*  MCV 89.8  < > 89.8 88.9  PLT 187  < > 202 194  < > = values in this interval not displayed. CBG:  Recent Labs Lab 03/29/16 1624 03/30/16 0911 03/30/16 1143 03/30/16 1635 03/30/16 2352 03/31/16 0704  GLUCAP 248* 228* 202* 238* 180* 174*   Studies/Results: No results found. Medications: I have reviewed the patient's current medications. Scheduled Meds: .  allopurinol  300 mg Oral Daily  . amLODipine  10 mg Oral Daily  . aspirin EC  81 mg Oral Daily  . carbamazepine  200 mg Oral Daily  . carbamazepine  300 mg Oral QHS  . cloNIDine  0.1 mg Transdermal Weekly  . enoxaparin (LOVENOX) injection  0.5 mg/kg Subcutaneous Q24H  . insulin aspart  0-9 Units Subcutaneous TID WC  . losartan  100 mg Oral Daily  . metoprolol  200 mg Oral BID  . pantoprazole  40 mg Oral Daily  . senna-docusate  1 tablet Oral QHS   Continuous Infusions: . sodium chloride 10 mL/hr at 03/28/16 2105   PRN Meds:.acetaminophen **OR** acetaminophen, albuterol, HYDROcodone-acetaminophen, ondansetron **OR** ondansetron (ZOFRAN) IV, traZODone Assessment/Plan: Principal Problem:   Septic arthritis of left foot (HCC) Active Problems:   Diabetic neuropathy (HCC)   HLD (hyperlipidemia)   OBESITY   HTN (hypertension)   COPD (chronic obstructive pulmonary disease) (HCC)   GERD   Chronic lower back pain on narcotics    Peripheral arterial disease (HCC)   Diabetic osteomyelitis [facility])  Mr. Putnam is a 70 year old male with severe peripheral arterial disease, non-insulin-dependent type 2 diabetes obligated by peripheral neuropathy, hypertension, chronic gout, COPD hospitalized for left  foot osteomyelitis complicated by septic arthritis s/p transmetatarsal amputation now post operative day three.  Diabetic osteomyelitis complicated by septic arthritis s/p transmetarsal amputation POD 3: Stable for discharge today. -Recommended he remain nonweightbearing on LLE -[facility] awaiting call for discharge  Non-insulin-dependent type 2 diabetes complicated by neuropathy: A1c 7.2 on this admission. -Continue sensitive sliding scale insulin -Continue home Tegretol 200 mg daily and 300 mg at bedtime for neuropathy  Peripheral arterial disease: Continue aspirin 81 mg. Holding Zetia for now.  Chronic lower back pain on narcotics: Continue Norco 10/325 every 6 hours as needed for low  back pain.  Hypertension: Blood pressure trending 150s to 190s/80s.  -Continue amlodipine 10 mg daily -Continue losartan 100 mg daily -Start clonidine 0.1mg  patch/week today -Continue metoprolol 200mg  twice daily  GERD: Continue pantoprazole 40 mg daily  COPD: Continue albuterol nebulizers every 6 hours as needed for wheezing  Gout: Continue allopurinol 300mg  daily.  #FEN:  -Diet: Carb Modified  #DVT prophylaxis: Lovenox  #CODE STATUS: DNR/DNI -Defer to [name] [[phone]] if patients lacks decision-making capacity -Confirmed with patient on admission  Dispo: Disposition is deferred at this time, awaiting improvement of current medical problems.    The patient does have a current PCP [name], MD) and does not need an [facility] hospital follow-up appointment after discharge.  The patient does not know have transportation limitations that hinder transportation to clinic appointments.  .Services Needed at time of discharge: Y = Yes, Blank = No PT:   OT:   RN:   Equipment:   Other:     LOS: 4 days   Riccardo Dubin, MD 03/31/2016, 8:38 AM

## 2016-03-31 NOTE — Discharge Summary (Signed)
Name: Leslie [name] MRN: [id number] DOB: 12/16/1945 70 y.o. PCP: [name], MD  Date of Admission: 03/27/2016  9:05 AM Date of Discharge: 03/31/2016 Attending Physician: Annia Belt, MD  Discharge Diagnosis: Principal Problem:   Septic arthritis of left foot ([place]) Active Problems:   Diabetic neuropathy ([place])   HLD (hyperlipidemia)   OBESITY   HTN (hypertension)   COPD (chronic obstructive pulmonary disease) (HCC)   GERD   Chronic lower back pain on narcotics    Peripheral arterial disease ([place])   Diabetic osteomyelitis ([place])  Discharge Medications:   Medication List    TAKE these medications        allopurinol 300 MG tablet  Commonly known as:  ZYLOPRIM  take 1 tablet by mouth once daily     amLODipine 10 MG tablet  Commonly known as:  NORVASC  Take 1 tablet (10 mg total) by mouth daily.     aspirin 81 MG tablet  Take 81 mg by mouth daily.     carbamazepine 200 MG tablet  Commonly known as:  TEGRETOL  Take 200-300 mg by mouth 2 (two) times daily. Take 1 tablet in the morning and 1 1/2 tabs in the evening     cholecalciferol 1000 units tablet  Commonly known as:  VITAMIN D  Take 1,000 Units by mouth daily.     CLONIDINE HCL TD  Place 1 patch onto the skin once a week.     cyanocobalamin 1000 MCG/ML injection  Commonly known as:  (VITAMIN B-12)  Inject 1,000 mcg into the muscle every 30 (thirty) days.     docusate sodium 100 MG capsule  Commonly known as:  COLACE  Take 100 mg by mouth 2 (two) times daily.     furosemide 20 MG tablet  Commonly known as:  LASIX  take 1 tablet by mouth once daily     HYDROcodone-acetaminophen 10-325 MG tablet  Commonly known as:  NORCO  Take 1 tablet by mouth every 8 (eight) hours as needed for pain.     losartan 100 MG tablet  Commonly known as:  COZAAR  Take 100 mg by mouth daily.     metFORMIN 1000 MG tablet  Commonly known as:  GLUCOPHAGE  Take 1 tablet (1,000 mg total) by mouth 2 (two) times  daily with a meal.     metoprolol 100 MG tablet  Commonly known as:  LOPRESSOR  Take 2 tablets (200 mg total) by mouth 2 (two) times daily.     nitroGLYCERIN 0.4 MG SL tablet  Commonly known as:  NITROSTAT  Place 0.4 mg under the tongue every 5 (five) minutes as needed.     pantoprazole 40 MG tablet  Commonly known as:  PROTONIX  Take 40 mg by mouth daily.     senna 8.6 MG tablet  Commonly known as:  SENOKOT  Take 2 tablets by mouth at bedtime.     traZODone 100 MG tablet  Commonly known as:  DESYREL  Take 150 mg by mouth at bedtime.        Disposition and follow-up:   Leslie [name] was discharged from [facility] in Stable condition.     Follow-up Appointments:     Follow-up Information    Follow up with Newt Minion, MD On 04/08/2016.   Specialty:  Orthopedic Surgery   Why:  at 3:45 PM for follow-up with your orthopedic surgeon   Contact information:   [place]  [place] [address] [phone]       Discharge Instructions: Discharge Instructions    Diet - low sodium heart healthy    Complete by:  As directed      Discharge instructions    Complete by:  As directed   Please don't put weight on your left leg.   Please keep wound vac on until you follow-up on June 13th. If your wound vac starts beeping, please call [facility]. It may need to be replaced.     Increase activity slowly    Complete by:  As directed      Neg Press Wound Therapy / Incisional    Complete by:  As directed   If the machine begins beeping then remove the dressing and applied dry dressing     Non weight bearing    Complete by:  As directed   Laterality:  left  Extremity:  Lower     Non weight bearing    Complete by:  As directed   Laterality:  left  Extremity:  Lower     Post-op shoe    Complete by:  As directed            Consultations:    Procedures Performed:  Mr Foot Left W Wo Contrast  03/27/2016  CLINICAL DATA:  Left foot redness with a  open wound over the fifth digit EXAM: MRI OF THE LEFT FOREFOOT WITHOUT AND WITH CONTRAST TECHNIQUE: Multiplanar, multisequence MR imaging was performed both before and after administration of intravenous contrast. CONTRAST:  76m MULTIHANCE GADOBENATE DIMEGLUMINE 529 MG/ML IV SOLN COMPARISON:  None. FINDINGS: Bones/Joint/Cartilage Soft tissue wound over the lateral aspect of the fifth metatarsal head. Surrounding soft tissue edema and enhancement. Soft tissue abnormality extends down to the cortex of the fifth metatarsal head. Severe marrow edema in the fifth metatarsal head with cortical destruction along the plantar aspect most consistent with osteomyelitis. Moderate joint effusion of the fifth MTP joint with marrow edema in the adjacent fifth proximal phalanx most concerning for septic arthritis. No other marrow signal abnormality. No fracture or dislocation. Normal alignment. Collaterals Collateral ligaments are intact. Tendons Flexor and extensor compartment tendons are intact. Muscles Mild increased T2 signal throughout the plantar musculature likely neurogenic given the patient's history of diabetes. Soft tissue No soft tissue mass. No other fluid collection or hematoma. Generalized soft tissue swelling of the left foot. IMPRESSION: 1. Soft tissue wound over the lateral aspect of the fifth metatarsal with surrounding cellulitis and cellulitis involving the dorsal aspect of the left foot. Osteomyelitis of the fifth metatarsal head and septic arthritis of the fifth MTP joint. Electronically Signed   By: HKathreen Devoid  On: 03/27/2016 14:21   Dg Foot 2 Views Left  03/27/2016  CLINICAL DATA:  Ulceration at level of proximal fifth metatarsal EXAM: LEFT FOOT - 2 VIEW COMPARISON:  January 29, 2015 FINDINGS: Frontal and lateral views were obtained. There is diffuse edema throughout the foot, most notably lateral to the distal fifth metatarsal. There is focal air in the soft tissues lateral to the distal fifth  metatarsal laterally. There is no fracture or dislocation. There is no erosive change or bony destruction evident. The joint spaces appear unremarkable. A small focus of vascular calcification is noted between the proximal first and second metatarsals. There is calcification in the distal Achilles tendon. IMPRESSION: Marked soft tissue swelling. Soft tissue air focally is noted lateral to the distal fifth metatarsal, concerning for soft tissue abscess development in this area.  No erosive change or bony destruction evident. No fracture or dislocation. No appreciable erosion or joint space narrowing. From an imaging standpoint, MR pre and post-contrast would be the imaging study of choice to further evaluate if further imaging is felt to be warranted. Electronically Signed   By: Lowella Grip III M.D.   On: 03/27/2016 10:48   Admission HPI: Mr. Chiaramonte is a 70 year old male with severe peripheral arterial disease, non-insulin-dependent type 2 diabetes complicated by peripheral neuropathy, hypertension, chronic gout, COPD who presented to the ED today with a poorly healing left foot wound. He first noticed it about 6 weeks ago and has been associated with some bloody drainage. [facility] to [facility] and was tried on seven-day courses of antibiotics which include doxycycline, Bactrim, Keflex. He was then scheduled to be seen by Dr. Sharol Given yesterday who recommended he be admitted today given the concern for osteomyelitis and his prior history of diabetic foot infections of both feet with slow healing in the setting of peripheral arterial disease, most recently about one year ago. Aside from mild nausea yesterday, he denies any vomiting, fever, chills, abdominal pain. He is able to perform most of his ADLs independently at home and using all drinking 3-4 beers at one time every so often. Otherwise, he denies any ongoing tobacco use or illicit drug use.  In the ED, he was found to have elevated ESR 90, CRP 23.4. Left foot  MRI was notable for osteomyelitis of the fifth metatarsal head and septic arthritis of the fifth MTP joint.  Hospital Course by problem list: Principal Problem:   Septic arthritis of left foot ([place]) Active Problems:   Diabetic neuropathy ([place])   HLD (hyperlipidemia)   OBESITY   HTN (hypertension)   COPD (chronic obstructive pulmonary disease) (HCC)   GERD   Chronic lower back pain on narcotics    Peripheral arterial disease (HCC)   Diabetic osteomyelitis (HCC)   Diabetic osteomyelitis complicated by septic arthritis s/p transmetatarsal amputation: On admission, he started on IV antibiotics. Orthopedic surgery was consulted who then subsequently performed transmetatarsal amputation of L 4th and 5th metatarsals on 03/28/16. He received IV antibiotics 24 hours postoperatively.His post-operative course was uncomplicated and he remained afebrile without systemic signs of infection. Pain was well controlled on home medication. Wound vac on LLE will remain on until his follow-up appointment with orthopedic surgery [see info above]. He was instructed to remain non-weightbearing on his L foot.   Non-insulin dependent type 2 diabetes complicated by peripheral neuropathy: A1c on admission was 7.2%. He was on sensitive SSI-S with good glycemic control.  Hypertension: Initially held clonidine patch as patient was not wearing one at admission but resumed once systolic blood pressures trended 180s to 200s. Restarted metoprolol 261m BID on 03/31/16 given persistently elevated BPs. Lasix was held throughout his hospital course as he was euvolemic. At discharge, blood pressure remain elevated at 196/82. Please consider titrating blood pressure regimen at follow-up.  Normocytic anemia: Suspect anemia of chronic disease given poorly healing wounds. Hb remained stable at 10, MCV upper 80s-low 90s but down from Hb 13.1 in 07/2015, even post-operatively. Consider checking ferritin and iron profile to  confirm.   Discharge Vitals:   BP 196/82 mmHg  Pulse 87  Temp(Src) 97.7 F (36.5 C) (Oral)  Resp 17  Ht 5' 6.14" (1.68 m)  Wt 224 lb 6.9 oz (101.8 kg)  BMI 36.07 kg/m2  SpO2 90%  Discharge Labs:  Results for orders placed or performed during the hospital  encounter of 03/27/16 (from the past 24 hour(s))  Glucose, capillary     Status: Abnormal   Collection Time: 03/30/16 11:43 AM  Result Value Ref Range   Glucose-Capillary 202 (H) 65 - 99 mg/dL  Glucose, capillary     Status: Abnormal   Collection Time: 03/30/16  4:35 PM  Result Value Ref Range   Glucose-Capillary 238 (H) 65 - 99 mg/dL  Glucose, capillary     Status: Abnormal   Collection Time: 03/30/16 11:52 PM  Result Value Ref Range   Glucose-Capillary 180 (H) 65 - 99 mg/dL  Glucose, capillary     Status: Abnormal   Collection Time: 03/31/16  7:04 AM  Result Value Ref Range   Glucose-Capillary 174 (H) 65 - 99 mg/dL    Signed: Riccardo Dubin, MD 03/31/2016, 11:30 AM    Services Ordered on Discharge: None Equipment Ordered on Discharge: None

## 2016-04-02 ENCOUNTER — Encounter (HOSPITAL_BASED_OUTPATIENT_CLINIC_OR_DEPARTMENT_OTHER): Payer: Medicare (Managed Care)

## 2016-04-07 ENCOUNTER — Other Ambulatory Visit (HOSPITAL_COMMUNITY): Payer: Self-pay | Admitting: Family

## 2016-04-10 ENCOUNTER — Encounter (HOSPITAL_COMMUNITY): Payer: Self-pay | Admitting: *Deleted

## 2016-04-10 MED ORDER — CEFAZOLIN SODIUM-DEXTROSE 2-4 GM/100ML-% IV SOLN
2.0000 g | INTRAVENOUS | Status: AC
  Administered 2016-04-11: 2 g via INTRAVENOUS
  Filled 2016-04-10: qty 100

## 2016-04-10 NOTE — Progress Notes (Signed)
   04/10/16 1041  OBSTRUCTIVE SLEEP APNEA  Have you ever been diagnosed with sleep apnea through a sleep study? No  Do you snore loudly (loud enough to be heard through closed doors)?  1  Do you often feel tired, fatigued, or sleepy during the daytime (such as falling asleep during driving or talking to someone)? 1  Has anyone observed you stop breathing during your sleep? 0  Do you have, or are you being treated for high blood pressure? 1  BMI more than 35 kg/m2? 1  Age > 25 (1-yes) 1  Male Gender (Yes=1) 1  Obstructive Sleep Apnea Score 6  Score 5 or greater  Results sent to PCP

## 2016-04-10 NOTE — Progress Notes (Addendum)
[name] reports having shortness of breath at it times- "they gave me an inhaler and it is some better."  "It may be from panic attacks- I've got a lot on my mind."  I spoke with patients nurse Leslie [name] at [facility], Leslie [name] reports that this is not a new problem.  Patient saw NP at [facility] last week and Leslie [name] will fax  The office visit to hospital.  Mr Leslie [name] report that runs  170 or above.   Patient will not take Metformin ion am. Patient does not have glucose gel or tabs and does not have any juice, just Hawain Punch. I gave patient Pre- Op number and asked him to call the number if CBG less than 70.  "It will not be less than 70."  Mr Leslie [name] denies chest pain.   Mr Leslie [name] reports that he plans to go to [facility] when he is discharged for the hospital.

## 2016-04-11 ENCOUNTER — Encounter (HOSPITAL_COMMUNITY): Payer: Self-pay | Admitting: *Deleted

## 2016-04-11 ENCOUNTER — Inpatient Hospital Stay (HOSPITAL_COMMUNITY): Payer: Medicare (Managed Care)

## 2016-04-11 ENCOUNTER — Inpatient Hospital Stay (HOSPITAL_COMMUNITY)
Admission: RE | Admit: 2016-04-11 | Discharge: 2016-04-14 | DRG: 240 | Disposition: A | Discharge status: Skilled Nursing Facility | Payer: Medicare (Managed Care) | Source: Ambulatory Visit | Attending: Orthopedic Surgery | Admitting: Orthopedic Surgery

## 2016-04-11 ENCOUNTER — Encounter (HOSPITAL_COMMUNITY)
Admission: RE | Disposition: A | Discharge status: Skilled Nursing Facility | Payer: Self-pay | Source: Ambulatory Visit | Attending: Orthopedic Surgery

## 2016-04-11 ENCOUNTER — Inpatient Hospital Stay (HOSPITAL_COMMUNITY): Payer: Medicare (Managed Care) | Admitting: Certified Registered Nurse Anesthetist

## 2016-04-11 DIAGNOSIS — E1152 Type 2 diabetes mellitus with diabetic peripheral angiopathy with gangrene: Principal | ICD-10-CM | POA: Diagnosis present

## 2016-04-11 DIAGNOSIS — K219 Gastro-esophageal reflux disease without esophagitis: Secondary | ICD-10-CM | POA: Diagnosis present

## 2016-04-11 DIAGNOSIS — I10 Essential (primary) hypertension: Secondary | ICD-10-CM | POA: Diagnosis present

## 2016-04-11 DIAGNOSIS — Z833 Family history of diabetes mellitus: Secondary | ICD-10-CM

## 2016-04-11 DIAGNOSIS — E785 Hyperlipidemia, unspecified: Secondary | ICD-10-CM | POA: Diagnosis present

## 2016-04-11 DIAGNOSIS — J449 Chronic obstructive pulmonary disease, unspecified: Secondary | ICD-10-CM | POA: Diagnosis present

## 2016-04-11 DIAGNOSIS — E1142 Type 2 diabetes mellitus with diabetic polyneuropathy: Secondary | ICD-10-CM | POA: Diagnosis present

## 2016-04-11 DIAGNOSIS — E1169 Type 2 diabetes mellitus with other specified complication: Secondary | ICD-10-CM | POA: Diagnosis present

## 2016-04-11 DIAGNOSIS — R0602 Shortness of breath: Secondary | ICD-10-CM

## 2016-04-11 DIAGNOSIS — M869 Osteomyelitis, unspecified: Secondary | ICD-10-CM | POA: Diagnosis present

## 2016-04-11 DIAGNOSIS — M545 Low back pain: Secondary | ICD-10-CM | POA: Diagnosis present

## 2016-04-11 DIAGNOSIS — Z89432 Acquired absence of left foot: Secondary | ICD-10-CM | POA: Diagnosis not present

## 2016-04-11 DIAGNOSIS — Z89511 Acquired absence of right leg below knee: Secondary | ICD-10-CM

## 2016-04-11 DIAGNOSIS — Z888 Allergy status to other drugs, medicaments and biological substances status: Secondary | ICD-10-CM | POA: Diagnosis not present

## 2016-04-11 DIAGNOSIS — G8929 Other chronic pain: Secondary | ICD-10-CM | POA: Diagnosis present

## 2016-04-11 DIAGNOSIS — F17220 Nicotine dependence, chewing tobacco, uncomplicated: Secondary | ICD-10-CM | POA: Diagnosis present

## 2016-04-11 DIAGNOSIS — Z89512 Acquired absence of left leg below knee: Secondary | ICD-10-CM

## 2016-04-11 HISTORY — PX: AMPUTATION: SHX166

## 2016-04-11 HISTORY — DX: Reserved for inherently not codable concepts without codable children: IMO0001

## 2016-04-11 HISTORY — DX: Other seasonal allergic rhinitis: J30.2

## 2016-04-11 HISTORY — DX: Anxiety disorder, unspecified: F41.9

## 2016-04-11 HISTORY — PX: LEG AMPUTATION BELOW KNEE: SHX694

## 2016-04-11 LAB — CBC
HEMATOCRIT: 31.2 % — AB (ref 39.0–52.0)
Hemoglobin: 10.3 g/dL — ABNORMAL LOW (ref 13.0–17.0)
MCH: 29.9 pg (ref 26.0–34.0)
MCHC: 33 g/dL (ref 30.0–36.0)
MCV: 90.7 fL (ref 78.0–100.0)
Platelets: 229 10*3/uL (ref 150–400)
RBC: 3.44 MIL/uL — AB (ref 4.22–5.81)
RDW: 13.8 % (ref 11.5–15.5)
WBC: 10.1 10*3/uL (ref 4.0–10.5)

## 2016-04-11 LAB — BASIC METABOLIC PANEL
ANION GAP: 9 (ref 5–15)
BUN: 8 mg/dL (ref 6–20)
CHLORIDE: 99 mmol/L — AB (ref 101–111)
CO2: 26 mmol/L (ref 22–32)
CREATININE: 0.91 mg/dL (ref 0.61–1.24)
Calcium: 8.9 mg/dL (ref 8.9–10.3)
GFR calc non Af Amer: >60 mL/min (ref >60)
Glucose, Bld: 144 mg/dL — ABNORMAL HIGH (ref 65–99)
POTASSIUM: 3.8 mmol/L (ref 3.5–5.1)
SODIUM: 134 mmol/L — AB (ref 135–145)

## 2016-04-11 LAB — GLUCOSE, CAPILLARY
GLUCOSE-CAPILLARY: 132 mg/dL — AB (ref 65–99)
GLUCOSE-CAPILLARY: 133 mg/dL — AB (ref 65–99)
Glucose-Capillary: 139 mg/dL — ABNORMAL HIGH (ref 65–99)
Glucose-Capillary: 147 mg/dL — ABNORMAL HIGH (ref 65–99)

## 2016-04-11 SURGERY — AMPUTATION BELOW KNEE
Anesthesia: General | Laterality: Left

## 2016-04-11 MED ORDER — ACETAMINOPHEN 325 MG PO TABS
650.0000 mg | ORAL_TABLET | Freq: Four times a day (QID) | ORAL | Status: DC | PRN
  Administered 2016-04-11 – 2016-04-14 (×5): 650 mg via ORAL
  Filled 2016-04-11 (×4): qty 2

## 2016-04-11 MED ORDER — METOCLOPRAMIDE HCL 5 MG/ML IJ SOLN: 5.0000 mg | Freq: Three times a day (TID) | INTRAMUSCULAR | Status: DC | PRN

## 2016-04-11 MED ORDER — MEPERIDINE HCL 25 MG/ML IJ SOLN
6.2500 mg | INTRAMUSCULAR | Status: DC | PRN
  Administered 2016-04-11: 6.25 mg via INTRAVENOUS

## 2016-04-11 MED ORDER — OXYCODONE HCL 5 MG PO TABS
5.0000 mg | ORAL_TABLET | ORAL | Status: DC | PRN
Start: 2016-04-11 — End: 2016-04-14
  Administered 2016-04-11 – 2016-04-14 (×8): 10 mg via ORAL
  Filled 2016-04-11 (×7): qty 2

## 2016-04-11 MED ORDER — ASPIRIN EC 81 MG PO TBEC
81.0000 mg | DELAYED_RELEASE_TABLET | Freq: Every day | ORAL | Status: DC
  Administered 2016-04-11 – 2016-04-14 (×4): 81 mg via ORAL
  Filled 2016-04-11 (×4): qty 1

## 2016-04-11 MED ORDER — DEXTROSE 5 % IV SOLN: 500.0000 mg | Freq: Four times a day (QID) | INTRAVENOUS | Status: DC | PRN

## 2016-04-11 MED ORDER — ACETAMINOPHEN 650 MG RE SUPP: 650.0000 mg | Freq: Four times a day (QID) | RECTAL | Status: DC | PRN

## 2016-04-11 MED ORDER — PROPOFOL 10 MG/ML IV BOLUS
INTRAVENOUS | Status: DC | PRN
  Administered 2016-04-11: 150 mg via INTRAVENOUS
  Administered 2016-04-11: 50 mg via INTRAVENOUS

## 2016-04-11 MED ORDER — METHOCARBAMOL 500 MG PO TABS
500.0000 mg | ORAL_TABLET | Freq: Four times a day (QID) | ORAL | Status: DC | PRN
  Administered 2016-04-11 – 2016-04-14 (×4): 500 mg via ORAL
  Filled 2016-04-11 (×3): qty 1

## 2016-04-11 MED ORDER — PANTOPRAZOLE SODIUM 40 MG PO TBEC
40.0000 mg | DELAYED_RELEASE_TABLET | Freq: Every day | ORAL | Status: DC
Start: 2016-04-11 — End: 2016-04-14
  Administered 2016-04-11 – 2016-04-14 (×4): 40 mg via ORAL
  Filled 2016-04-11 (×4): qty 1

## 2016-04-11 MED ORDER — OXYCODONE HCL 5 MG PO TABS
ORAL_TABLET | ORAL | Status: AC
  Administered 2016-04-11: 10 mg via ORAL
  Filled 2016-04-11: qty 2

## 2016-04-11 MED ORDER — ONDANSETRON HCL 4 MG/2ML IJ SOLN
INTRAMUSCULAR | Status: AC
  Filled 2016-04-11: qty 2

## 2016-04-11 MED ORDER — LIDOCAINE HCL (CARDIAC) 20 MG/ML IV SOLN
INTRAVENOUS | Status: DC | PRN
  Administered 2016-04-11: 100 mg via INTRAVENOUS

## 2016-04-11 MED ORDER — METOPROLOL TARTRATE 50 MG PO TABS: 200.0000 mg | ORAL_TABLET | Freq: Once | ORAL | Status: DC

## 2016-04-11 MED ORDER — NITROGLYCERIN 0.4 MG SL SUBL: 0.4000 mg | SUBLINGUAL_TABLET | SUBLINGUAL | Status: DC | PRN

## 2016-04-11 MED ORDER — FENTANYL CITRATE (PF) 100 MCG/2ML IJ SOLN
INTRAMUSCULAR | Status: AC
  Administered 2016-04-11: 50 ug via INTRAVENOUS
  Filled 2016-04-11: qty 2

## 2016-04-11 MED ORDER — METOPROLOL TARTRATE 5 MG/5ML IV SOLN: 5.0000 mg | Freq: Once | INTRAVENOUS | Status: DC

## 2016-04-11 MED ORDER — METFORMIN HCL 500 MG PO TABS
1000.0000 mg | ORAL_TABLET | Freq: Two times a day (BID) | ORAL | Status: DC
  Administered 2016-04-11 – 2016-04-14 (×6): 1000 mg via ORAL
  Filled 2016-04-11 (×6): qty 2

## 2016-04-11 MED ORDER — LOSARTAN POTASSIUM 50 MG PO TABS
100.0000 mg | ORAL_TABLET | Freq: Every day | ORAL | Status: DC
  Administered 2016-04-11 – 2016-04-14 (×4): 100 mg via ORAL
  Filled 2016-04-11 (×4): qty 2

## 2016-04-11 MED ORDER — HYDROMORPHONE HCL 1 MG/ML IJ SOLN
INTRAMUSCULAR | Status: AC
  Filled 2016-04-11: qty 1

## 2016-04-11 MED ORDER — MEPERIDINE HCL 25 MG/ML IJ SOLN
INTRAMUSCULAR | Status: AC
  Filled 2016-04-11: qty 1

## 2016-04-11 MED ORDER — CARBAMAZEPINE 200 MG PO TABS
200.0000 mg | ORAL_TABLET | Freq: Every day | ORAL | Status: DC
  Administered 2016-04-12 – 2016-04-14 (×3): 200 mg via ORAL
  Filled 2016-04-11 (×3): qty 1

## 2016-04-11 MED ORDER — CARBAMAZEPINE 200 MG PO TABS: 200.0000 mg | ORAL_TABLET | Freq: Two times a day (BID) | ORAL | Status: DC

## 2016-04-11 MED ORDER — ALBUTEROL SULFATE (2.5 MG/3ML) 0.083% IN NEBU: 1.5000 mL | INHALATION_SOLUTION | Freq: Four times a day (QID) | RESPIRATORY_TRACT | Status: DC | PRN

## 2016-04-11 MED ORDER — ALLOPURINOL 300 MG PO TABS
300.0000 mg | ORAL_TABLET | Freq: Every day | ORAL | Status: DC
  Administered 2016-04-11 – 2016-04-14 (×4): 300 mg via ORAL
  Filled 2016-04-11 (×4): qty 1

## 2016-04-11 MED ORDER — AMLODIPINE BESYLATE 10 MG PO TABS
10.0000 mg | ORAL_TABLET | Freq: Every day | ORAL | Status: DC
  Administered 2016-04-11 – 2016-04-14 (×4): 10 mg via ORAL
  Filled 2016-04-11 (×4): qty 1

## 2016-04-11 MED ORDER — FENTANYL CITRATE (PF) 250 MCG/5ML IJ SOLN
INTRAMUSCULAR | Status: AC
  Filled 2016-04-11: qty 5

## 2016-04-11 MED ORDER — ACETAMINOPHEN 325 MG PO TABS
ORAL_TABLET | ORAL | Status: AC
  Administered 2016-04-11: 650 mg via ORAL
  Filled 2016-04-11: qty 2

## 2016-04-11 MED ORDER — SODIUM CHLORIDE 0.9 % IV SOLN
INTRAVENOUS | Status: DC
Start: 2016-04-11 — End: 2016-04-14
  Administered 2016-04-11: 17:00:00 via INTRAVENOUS

## 2016-04-11 MED ORDER — FENTANYL CITRATE (PF) 100 MCG/2ML IJ SOLN
25.0000 ug | INTRAMUSCULAR | Status: DC | PRN
  Administered 2016-04-11 (×3): 50 ug via INTRAVENOUS

## 2016-04-11 MED ORDER — METOPROLOL TARTRATE 100 MG PO TABS
200.0000 mg | ORAL_TABLET | Freq: Two times a day (BID) | ORAL | Status: DC
  Administered 2016-04-11 – 2016-04-14 (×6): 200 mg via ORAL
  Filled 2016-04-11 (×6): qty 2

## 2016-04-11 MED ORDER — LACTATED RINGERS IV SOLN
INTRAVENOUS | Status: DC
  Administered 2016-04-11: 13:00:00 via INTRAVENOUS
  Administered 2016-04-11: 50 mL/h via INTRAVENOUS

## 2016-04-11 MED ORDER — MORPHINE SULFATE (PF) 2 MG/ML IV SOLN
2.0000 mg | INTRAVENOUS | Status: DC | PRN
  Administered 2016-04-11 – 2016-04-12 (×5): 2 mg via INTRAVENOUS
  Filled 2016-04-11 (×5): qty 1

## 2016-04-11 MED ORDER — TRAZODONE HCL 150 MG PO TABS
150.0000 mg | ORAL_TABLET | Freq: Every day | ORAL | Status: DC
  Administered 2016-04-11 – 2016-04-13 (×3): 150 mg via ORAL
  Filled 2016-04-11 (×4): qty 1

## 2016-04-11 MED ORDER — CARBAMAZEPINE 200 MG PO TABS
300.0000 mg | ORAL_TABLET | Freq: Every day | ORAL | Status: DC
  Administered 2016-04-11 – 2016-04-13 (×3): 300 mg via ORAL
  Filled 2016-04-11 (×3): qty 2

## 2016-04-11 MED ORDER — ONDANSETRON HCL 4 MG/2ML IJ SOLN
INTRAMUSCULAR | Status: DC | PRN
  Administered 2016-04-11: 4 mg via INTRAVENOUS

## 2016-04-11 MED ORDER — CHLORHEXIDINE GLUCONATE 4 % EX LIQD
60.0000 mL | Freq: Once | CUTANEOUS | Status: DC
Start: 2016-04-11 — End: 2016-04-11

## 2016-04-11 MED ORDER — METOCLOPRAMIDE HCL 5 MG/ML IJ SOLN: 10.0000 mg | Freq: Once | INTRAMUSCULAR | Status: DC | PRN

## 2016-04-11 MED ORDER — METHOCARBAMOL 500 MG PO TABS
ORAL_TABLET | ORAL | Status: AC
  Filled 2016-04-11: qty 1

## 2016-04-11 MED ORDER — MIDAZOLAM HCL 2 MG/2ML IJ SOLN
INTRAMUSCULAR | Status: AC
  Filled 2016-04-11: qty 2

## 2016-04-11 MED ORDER — CEFAZOLIN SODIUM 1-5 GM-% IV SOLN
1.0000 g | Freq: Four times a day (QID) | INTRAVENOUS | Status: AC
  Administered 2016-04-11 – 2016-04-12 (×3): 1 g via INTRAVENOUS
  Filled 2016-04-11 (×3): qty 50

## 2016-04-11 MED ORDER — SENNA 8.6 MG PO TABS
2.0000 | ORAL_TABLET | Freq: Every day | ORAL | Status: DC
  Administered 2016-04-11 – 2016-04-13 (×3): 17.2 mg via ORAL
  Filled 2016-04-11 (×6): qty 2

## 2016-04-11 MED ORDER — ONDANSETRON HCL 4 MG/2ML IJ SOLN: 4.0000 mg | Freq: Four times a day (QID) | INTRAMUSCULAR | Status: DC | PRN

## 2016-04-11 MED ORDER — ONDANSETRON HCL 4 MG PO TABS: 4.0000 mg | ORAL_TABLET | Freq: Four times a day (QID) | ORAL | Status: DC | PRN

## 2016-04-11 MED ORDER — METOCLOPRAMIDE HCL 5 MG PO TABS: 5.0000 mg | ORAL_TABLET | Freq: Three times a day (TID) | ORAL | Status: DC | PRN

## 2016-04-11 MED ORDER — FENTANYL CITRATE (PF) 100 MCG/2ML IJ SOLN
INTRAMUSCULAR | Status: DC | PRN
  Administered 2016-04-11 (×2): 50 ug via INTRAVENOUS

## 2016-04-11 MED ORDER — INSULIN ASPART 100 UNIT/ML ~~LOC~~ SOLN
0.0000 [IU] | Freq: Three times a day (TID) | SUBCUTANEOUS | Status: DC
  Administered 2016-04-12: 2 [IU] via SUBCUTANEOUS
  Administered 2016-04-12: 3 [IU] via SUBCUTANEOUS
  Administered 2016-04-12: 2 [IU] via SUBCUTANEOUS
  Administered 2016-04-13: 3 [IU] via SUBCUTANEOUS
  Administered 2016-04-13 – 2016-04-14 (×3): 2 [IU] via SUBCUTANEOUS

## 2016-04-11 MED ORDER — HYDROMORPHONE HCL 1 MG/ML IJ SOLN
1.0000 mg | INTRAMUSCULAR | Status: DC | PRN
  Administered 2016-04-11 – 2016-04-13 (×4): 1 mg via INTRAVENOUS
  Filled 2016-04-11 (×3): qty 1

## 2016-04-11 MED ORDER — FUROSEMIDE 20 MG PO TABS
20.0000 mg | ORAL_TABLET | Freq: Every day | ORAL | Status: DC
  Administered 2016-04-11 – 2016-04-14 (×4): 20 mg via ORAL
  Filled 2016-04-11 (×4): qty 1

## 2016-04-11 MED ORDER — DOCUSATE SODIUM 100 MG PO CAPS
100.0000 mg | ORAL_CAPSULE | Freq: Two times a day (BID) | ORAL | Status: DC
  Administered 2016-04-11 – 2016-04-14 (×6): 100 mg via ORAL
  Filled 2016-04-11 (×6): qty 1

## 2016-04-11 SURGICAL SUPPLY — 37 items
BLADE SAW RECIP 87.9 MT (BLADE) ×3 IMPLANT
BLADE SURG 21 STRL SS (BLADE) ×3 IMPLANT
BNDG COHESIVE 6X5 TAN STRL LF (GAUZE/BANDAGES/DRESSINGS) ×6 IMPLANT
BNDG GAUZE ELAST 4 BULKY (GAUZE/BANDAGES/DRESSINGS) ×3 IMPLANT
COVER SURGICAL LIGHT HANDLE (MISCELLANEOUS) ×6 IMPLANT
CUFF TOURNIQUET SINGLE 34IN LL (TOURNIQUET CUFF) ×3 IMPLANT
CUFF TOURNIQUET SINGLE 44IN (TOURNIQUET CUFF) IMPLANT
DRAPE EXTREMITY T 121X128X90 (DRAPE) ×3 IMPLANT
DRAPE INCISE IOBAN 66X45 STRL (DRAPES) IMPLANT
DRAPE PROXIMA HALF (DRAPES) ×3 IMPLANT
DRAPE U-SHAPE 47X51 STRL (DRAPES) ×3 IMPLANT
DRSG ADAPTIC 3X8 NADH LF (GAUZE/BANDAGES/DRESSINGS) ×3 IMPLANT
DRSG MEPILEX BORDER 4X12 (GAUZE/BANDAGES/DRESSINGS) ×3 IMPLANT
DRSG MEPILEX BORDER 4X4 (GAUZE/BANDAGES/DRESSINGS) ×3 IMPLANT
DRSG PAD ABDOMINAL 8X10 ST (GAUZE/BANDAGES/DRESSINGS) ×3 IMPLANT
DURAPREP 26ML APPLICATOR (WOUND CARE) ×3 IMPLANT
ELECT REM PT RETURN 9FT ADLT (ELECTROSURGICAL) ×3
ELECTRODE REM PT RTRN 9FT ADLT (ELECTROSURGICAL) ×1 IMPLANT
GAUZE SPONGE 4X4 12PLY STRL (GAUZE/BANDAGES/DRESSINGS) ×3 IMPLANT
GLOVE BIOGEL PI IND STRL 9 (GLOVE) ×1 IMPLANT
GLOVE BIOGEL PI INDICATOR 9 (GLOVE) ×2
GLOVE SURG ORTHO 9.0 STRL STRW (GLOVE) ×3 IMPLANT
GOWN STRL REUS W/ TWL XL LVL3 (GOWN DISPOSABLE) ×2 IMPLANT
GOWN STRL REUS W/TWL XL LVL3 (GOWN DISPOSABLE) ×6
KIT BASIN OR (CUSTOM PROCEDURE TRAY) ×3 IMPLANT
KIT ROOM TURNOVER OR (KITS) ×3 IMPLANT
MANIFOLD NEPTUNE II (INSTRUMENTS) ×3 IMPLANT
NS IRRIG 1000ML POUR BTL (IV SOLUTION) ×3 IMPLANT
PACK GENERAL/GYN (CUSTOM PROCEDURE TRAY) ×3 IMPLANT
PAD ARMBOARD 7.5X6 YLW CONV (MISCELLANEOUS) ×3 IMPLANT
SPONGE LAP 18X18 X RAY DECT (DISPOSABLE) IMPLANT
STAPLER VISISTAT 35W (STAPLE) IMPLANT
STOCKINETTE IMPERVIOUS LG (DRAPES) ×3 IMPLANT
SUT SILK 2 0 (SUTURE) ×3
SUT SILK 2-0 18XBRD TIE 12 (SUTURE) ×1 IMPLANT
SUT VIC AB 1 CTX 27 (SUTURE) IMPLANT
TOWEL OR 17X26 10 PK STRL BLUE (TOWEL DISPOSABLE) ×3 IMPLANT

## 2016-04-11 NOTE — Transfer of Care (Signed)
Immediate Anesthesia Transfer of Care Note  Patient: Leslie [name]  Procedure(s) Performed: Procedure(s): LEFT BELOW KNEE AMPUTATION (Left)  Patient Location: PACU  Anesthesia Type:General  Level of Consciousness: awake and alert   Airway & Oxygen Therapy: Patient Spontanous Breathing and Patient connected to nasal cannula oxygen  Post-op Assessment: Report given to RN and Post -op Vital signs reviewed and stable  Post vital signs: Reviewed and stable  Last Vitals:  Filed Vitals:   04/11/16 1054 04/11/16 1319  BP: 201/75 139/78  Pulse: 88 92  Temp: 36.9 C 36.7 C  Resp: 18 18    Last Pain: There were no vitals filed for this visit.    Patients Stated Pain Goal: 1 ([id number])  Complications: No apparent anesthesia complications

## 2016-04-11 NOTE — Progress Notes (Signed)
I attempted to take several blood pressure readings with different cuffs.  Initial bp was 201/75 Spoke with Dr. Marcell Barlow, order given for IV Lopressor.  However, after his return from x-ray, I obtained a bp of 159/102 with HR of 82.  I called Dr. Marcell Barlow for clarification on po & iv meds.  Both are to be held at this moment.

## 2016-04-11 NOTE — Anesthesia Procedure Notes (Signed)
Procedure Name: LMA Insertion Date/Time: 04/11/2016 1:33 PM Performed by: Ollen Bowl Pre-anesthesia Checklist: Patient identified, Emergency Drugs available, Suction available, Patient being monitored and Timeout performed Patient Re-evaluated:Patient Re-evaluated prior to inductionOxygen Delivery Method: Circle system utilized and Simple face mask Preoxygenation: Pre-oxygenation with 100% oxygen Intubation Type: IV induction Ventilation: Mask ventilation without difficulty LMA: LMA inserted LMA Size: 5.0 Number of attempts: 1 Airway Equipment and Method: Patient positioned with wedge pillow Placement Confirmation: positive ETCO2 and breath sounds checked- equal and bilateral Tube secured with: Tape Dental Injury: Teeth and Oropharynx as per pre-operative assessment

## 2016-04-11 NOTE — Anesthesia Preprocedure Evaluation (Addendum)
Anesthesia Evaluation  Patient identified by MRN, date of birth, ID band Patient awake    Reviewed: Allergy & Precautions, H&P , NPO status , Patient's Chart, lab work & pertinent test results  History of Anesthesia Complications Negative for: history of anesthetic complications  Airway Mallampati: II  TM Distance: >3 FB Neck ROM: full    Dental no notable dental hx.    Pulmonary asthma , COPD, former smoker,    Pulmonary exam normal breath sounds clear to auscultation       Cardiovascular hypertension, Pt. on medications Normal cardiovascular exam Rhythm:regular Rate:Normal     Neuro/Psych negative neurological ROS  negative psych ROS   GI/Hepatic Neg liver ROS, GERD  ,  Endo/Other  diabetes, Poorly Controlled  Renal/GU negative Renal ROS  negative genitourinary   Musculoskeletal negative musculoskeletal ROS (+) Arthritis ,   Abdominal Normal abdominal exam  (+)   Peds negative pediatric ROS (+)  Hematology negative hematology ROS (+)   Anesthesia Other Findings   Reproductive/Obstetrics negative OB ROS                             Anesthesia Physical  Anesthesia Plan  ASA: III  Anesthesia Plan: General   Post-op Pain Management:    Induction: Intravenous  Airway Management Planned: LMA  Additional Equipment:   Intra-op Plan:   Post-operative Plan:   Informed Consent: I have reviewed the patients History and Physical, chart, labs and discussed the procedure including the risks, benefits and alternatives for the proposed anesthesia with the patient or authorized representative who has indicated his/her understanding and acceptance.   Dental Advisory Given  Plan Discussed with: Anesthesiologist, CRNA and Surgeon  Anesthesia Plan Comments:         Anesthesia Quick Evaluation

## 2016-04-11 NOTE — H&P (Signed)
Leslie [name] is an 70 y.o. male.   Chief Complaint: Necrotic ulceration left forefoot status post foot salvage intervention HPI: Patient is a 70 year old gentleman status post vascular catheterization for limb salvage intervention for chronic limb ischemia to the left lower extremity. Patient attempted foot salvage intervention with ray amputations of the left foot. Patient has progressive gangrenous changes of the left foot despite aggressive treatment for limb salvage.  Past Medical History  Diagnosis Date  . Asthma   . GERD (gastroesophageal reflux disease)   . Hyperlipidemia   . Hypertension   . Substance abuse   . Peripheral neuropathy ([place]) 07/01/2012  . Type II diabetes mellitus ([place]) 2002    Diagnosed in 2002  . Gouty arthritis 07/01/2012    Not attacks in ~5 yrs (03/2016)  . Chronic lower back pain 07/01/2012  . Skin growth 07/01/2012    right side of nares; "been on there 20 years"  . Hepatitis 1967    "in jail when I got it; dr said it was pretty bad; quaranteened X 30d"  . Cellulitis of right foot 06/30/2012  . Peripheral arterial disease ([place])     critical limb ischemia  . Lung nodule, solitary 07/2015    Due for 1-yr recheck in 07/2017  . Cellulitis of left foot 2016    Healed and recurred 01/2016  . Shortness of breath dyspnea   . Anxiety     Panic attack - 10  years aago- ? panic   . Seasonal allergies   . COPD (chronic obstructive pulmonary disease) ([facility])     Uses nebulizers at home. Former smoker, current chewing tobacco. 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    Past Surgical History  Procedure Laterality Date  . Lower extremity angiogram N/A 04/18/2013    Procedure: LOWER EXTREMITY ANGIOGRAM;  Surgeon: Sherren Mocha, MD;  Location[facility] CATH LAB;  Service: Cardiovascular;  Laterality: N/A;  . Lower extremity angiogram  08/13/2015    bilateral iliac   . Peripheral vascular catheterization Bilateral 08/13/2015    Procedure: Lower Extremity Angiography;  Surgeon: Lorretta Harp, MD;   Location: [facility] CV LAB;  Service: Cardiovascular;  Laterality: Bilateral;  . Peripheral vascular catheterization N/A 08/13/2015    Procedure: Abdominal Aortogram;  Surgeon: Lorretta Harp, MD;  Location: [place] CV LAB;  Service: Cardiovascular;  Laterality: N/A;  . Amputation Left 03/28/2016    Procedure: AMPUTATION left foot 4th and 5 th ray;  Surgeon: Newt Minion, MD;  Location: [place];  Service: Orthopedics;  Laterality: Left;    Family History  Problem Relation Age of Onset  . Diabetes Sister   . Hypertension Mother    Social History:  reports that he quit smoking about 14 years ago. His smoking use included Cigarettes. He has a 80 pack-year smoking history. His smokeless tobacco use includes Chew. He reports that he drinks about 0.6 oz of alcohol per week. He reports that he does not use illicit drugs.  Allergies:  Allergies  Allergen Reactions  . Flexeril [Cyclobenzaprine] Other (See Comments)    Dry mouth and hallucinations.     No prescriptions prior to admission    No results found for this or any previous visit (from the past 48 hour(s)). No results found.  Review of Systems  Psychiatric/Behavioral: Substance abuse:  There are no palpable pulses. There is no purulence no abscess.  All other systems reviewed and are negative.   There were no vitals taken for this visit. Physical Exam  On examination  patient has ischemic necrotic ulceration status post fourth and fifth ray amputations. There is no ascending cellulitis. Assessment/Plan Assessment progressive gangrenous changes left forefoot status post limb salvage intervention.  Plan: We'll plan for left transtibial amputation. Risk and benefits were discussed including risk of the wound not healing. Patient states he understands wishes to proceed at this time.  Newt Minion, MD 04/11/2016, 6:50 AM

## 2016-04-11 NOTE — Anesthesia Postprocedure Evaluation (Signed)
Anesthesia Post Note  Patient: Leslie [name]  Procedure(s) Performed: Procedure(s) (LRB): LEFT BELOW KNEE AMPUTATION (Left)  Patient location during evaluation: PACU Anesthesia Type: General Level of consciousness: awake and alert Pain management: pain level controlled Vital Signs Assessment: post-procedure vital signs reviewed and stable Respiratory status: spontaneous breathing, nonlabored ventilation, respiratory function stable and patient connected to nasal cannula oxygen Cardiovascular status: blood pressure returned to baseline and stable Postop Assessment: no signs of nausea or vomiting Anesthetic complications: no    Last Vitals:  Filed Vitals:   04/11/16 1500 04/11/16 1515  BP: 175/72 181/72  Pulse:    Temp:    Resp: 18 16    Last Pain: There were no vitals filed for this visit.               Montez Hageman

## 2016-04-11 NOTE — Op Note (Signed)
   Date of Surgery: 04/11/2016  INDICATIONS: Leslie [name] is a 70 y.o.-year-old male who is status post salvage intervention.  PREOPERATIVE DIAGNOSIS: Gangrene left foot  POSTOPERATIVE DIAGNOSIS: Same.  PROCEDURE: Transtibial amputation Application of  prosthetic shrinker  SURGEON: Sharol Given, M.D.  ANESTHESIA:  general  IV FLUIDS AND URINE: See anesthesia.  ESTIMATED BLOOD LOSS: Minimal mL.  COMPLICATIONS: None.  DESCRIPTION OF PROCEDURE: The patient was brought to the operating room and underwent a general anesthetic. After adequate levels of anesthesia were obtained patient's lower extremity was prepped using DuraPrep draped into a sterile field. A timeout was called. The foot was draped out of the sterile field with impervious stockinette. A transverse incision was made 11 cm distal to the tibial tubercle. This curved proximally and a large posterior flap was created. The tibia was transected 1 cm proximal to the skin incision. The fibula was transected just proximal to the tibial incision. The tibia was beveled anteriorly. A large posterior flap was created. The sciatic nerve was pulled cut and allowed to retract. The vascular bundles were suture ligated with 2-0 silk. The deep and superficial fascial layers were closed using #1 Vicryl. The skin was closed using staples and 2-0 nylon. The wound was covered with Mepilex dressing and prosthetic shrinker. . Patient was extubated taken to the PACU in stable condition.  Leslie Score, MD [place] 2:08 PM

## 2016-04-12 LAB — GLUCOSE, CAPILLARY
GLUCOSE-CAPILLARY: 131 mg/dL — AB (ref 65–99)
GLUCOSE-CAPILLARY: 166 mg/dL — AB (ref 65–99)
Glucose-Capillary: 148 mg/dL — ABNORMAL HIGH (ref 65–99)
Glucose-Capillary: 145 mg/dL — ABNORMAL HIGH (ref 65–99)

## 2016-04-12 NOTE — Clinical Social Work Note (Signed)
Clinical Social Work Assessment  Patient Details  Name: Leslie [name] MRN: [id number] Date of Birth: [birth year]  Date of referral:  04/12/16               Reason for consult:  Discharge Planning                Permission sought to share information with:  Case Manager, Facility Sport and exercise psychologist, Other Permission granted to share information::  Yes, Verbal Permission Granted  Name::        Agency::   (SNF)  Relationship::     Contact Information:     Housing/Transportation Living arrangements for the past 2 months:  Apartment Source of Information:  Patient, Medical Team Patient Interpreter Needed:  None Criminal Activity/Legal Involvement Pertinent to Current Situation/Hospitalization:  No - Comment as needed Significant Relationships:  Friend, Warehouse manager ([facility]) Lives with:    Do you feel safe going back to the place where you live?  No Need for family participation in patient care:  Yes (Comment)  Care giving concerns: Pt did not express any care giving concerns at this time. Pt open to SNF for higher level of care.    Social Worker assessment / plan: Holiday representative met with pt and discussed CSW role with discharge planning. Pt shares that he was admitted from home. Pt agreeable to SNF for higher level of care. Pt requested to be discharged to [facility] for SNF. Pt stated his family and friends live close to [place]. CSW provided pt with SNF list. Pt stated he is agreeable to other SNF if [facility] does not have any beds available. Pt also informed CSW that he receives services from [facility] and that [facility] will help him in choosing SNF.   CSW will follow up with bed offers once available.   Employment status:  Retired Forensic scientist:  Other (Comment Required) ([facility]) PT Recommendations:  [place] / Referral to community resources:  [place]  Patient/Family's Response to care: Pt sitting up in bet. Pt  understands SNF reccomendation and Pt appears happy with care he is receiving at [facility].   Patient/Family's Understanding of and Emotional Response to Diagnosis, Current Treatment, and Prognosis: Pt appears to have good understanding of reason for admission into hospital, and with pt care plan.  Emotional Assessment Appearance:  Appears stated age Attitude/Demeanor/Rapport:   (Pleasant) Affect (typically observed):  Accepting, Blunt, Pleasant Orientation:  Oriented to Self, Oriented to Place, Oriented to  Time, Oriented to Situation Alcohol / Substance use:  Not Applicable Psych involvement (Current and /or in the community):  No (Comment)  Discharge Needs  Concerns to be addressed:  Discharge Planning Concerns Readmission within the last 30 days:  Yes Current discharge risk:  Lives alone Barriers to Discharge:  Continued Medical Work up   Leslie Spencer, LCSW 04/12/2016, 2:32 PM

## 2016-04-12 NOTE — Clinical Social Work Placement (Signed)
   CLINICAL SOCIAL WORK PLACEMENT  NOTE  Date:  04/12/2016  Patient Details  Name: Leslie [name] MRN: [id number] Date of Birth: [birth year]  Clinical Social Work is seeking post-discharge placement for this patient at the [place] level of care (*CSW will initial, date and re-position this form in  chart as items are completed):  Yes   Patient/family provided with [facility] Work Department's list of facilities offering this level of care within the geographic area requested by the patient (or if unable, by the patient's family).  Yes   Patient/family informed of their freedom to choose among providers that offer the needed level of care, that participate in Medicare, Medicaid or managed care program needed by the patient, have an available bed and are willing to accept the patient.  Yes   Patient/family informed of [facility]'s ownership interest in [facility] and [facility], as well as of the fact that they are under no obligation to receive care at these facilities.  PASRR submitted to EDS on 04/12/16     PASRR number received on 04/12/16     Existing PASRR number confirmed on       FL2 transmitted to all facilities in geographic area requested by pt/family on 04/12/16     FL2 transmitted to all facilities within larger geographic area on 04/12/16     Patient informed that his/her managed care company has contracts with or will negotiate with certain facilities, including the following:            Patient/family informed of bed offers received.  Patient chooses bed at       Physician recommends and patient chooses bed at      Patient to be transferred to   on  .  Patient to be transferred to facility by       Patient family notified on   of transfer.  Name of family member notified:        PHYSICIAN Please prepare priority discharge summary, including medications, Please prepare prescriptions, Please sign FL2      Additional Comment:    _______________________________________________ Junie Spencer, LCSW 04/12/2016, 3:15 PM

## 2016-04-12 NOTE — Progress Notes (Signed)
Subjective: Patient stable pain okay   Objective: Vital signs in last 24 hours: Temp:  [98 F (36.7 C)-99.3 F (37.4 C)] 99.3 F (37.4 C) (06/17 0504) Pulse Rate:  [66-92] 91 (06/17 0504) Resp:  [13-19] 19 (06/17 0504) BP: (139-193)/(71-85) 166/71 mmHg (06/17 0504) SpO2:  [91 %-100 %] 91 % (06/17 0504)  Intake/Output from previous day: 06/16 0701 - 06/17 0700 In: 600 [I.V.:600] Out: 500 [Urine:400; Blood:100] Intake/Output this shift:    Exam:  Stump dressing in place on the left BKA stump  Labs:  Recent Labs  04/11/16 1058  HGB 10.3*    Recent Labs  04/11/16 1058  WBC 10.1  RBC 3.44*  HCT 31.2*  PLT 229    Recent Labs  04/11/16 1058  NA 134*  K 3.8  CL 99*  CO2 26  BUN 8  CREATININE 0.91  GLUCOSE 144*  CALCIUM 8.9   No results for input(s): LABPT, INR in the last 72 hours.  Assessment/Plan: Plan to mobilize as much as possible keep dressing in place   [facility] 04/12/2016, 11:45 AM

## 2016-04-12 NOTE — NC FL2 (Signed)
[place] LEVEL OF CARE SCREENING TOOL     IDENTIFICATION  Patient Name: Leslie [name] Birthdate: 10/14/1946 Sex: male Admission Date (Current Location): 04/11/2016  [facility] and [place] Number:  Herbalist and Address:  [facility], [place] [address] [place], [place] [address]      Provider Number: [id number]  Attending Physician Name and Address:  Newt Minion, MD  Relative Name and Phone Number:       Current Level of Care: Hospital Recommended Level of Care: [place] Prior Approval Number:    Date Approved/Denied:   PASRR Number:   [id number]  Discharge Plan: SNF    Current Diagnoses: Patient Active Problem List   Diagnosis Date Noted  . Below knee amputation status ([place]) 04/11/2016  . History of tobacco abuse 03/31/2016  . Osteomyelitis of left foot ([place])   . 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 Septic arthritis of left foot ([place]) 03/27/2016  . Solitary pulmonary nodule 08/14/2015  . Precordial chest pain 08/13/2015  . Peripheral arterial disease ([place]) 04/18/2015  . 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 Right anterior knee pain 11/19/2012  . Toenail deformity 01/16/2012  . Gout   . Diabetic neuropathy ([place]) 03/31/2008  . ALCOHOLISM 03/31/2008  . Rosacea 03/31/2008  . GERD 01/31/2008  . COPD (chronic obstructive pulmonary disease) ([place]) 08/03/2007  . DM W/COMPLICATION NOS, TYPE II 05/08/2007  . OBESITY 05/08/2007  . HTN (hypertension) 05/08/2007  . 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 HISTORY OF ASBESTOS EXPOSURE 05/08/2007  . Chronic lower back pain on narcotics  06/16/2005  . HLD (hyperlipidemia) 03/22/2002    Orientation RESPIRATION BLADDER Height & Weight     Self, Time, Situation, Place  Normal Continent Weight: 224 lb (101.606 kg) Height:     BEHAVIORAL SYMPTOMS/MOOD NEUROLOGICAL BOWEL NUTRITION STATUS   (None)   Continent Diet (Carb modified)  AMBULATORY STATUS COMMUNICATION OF NEEDS Skin    Extensive Assist Verbally Surgical wounds                       Personal Care Assistance Level of Assistance  Bathing, Feeding, Dressing Bathing Assistance: Limited assistance Feeding assistance: Independent Dressing Assistance: Limited assistance     Functional Limitations Info  Sight, Hearing, Speech Sight Info: Adequate Hearing Info: Adequate Speech Info: Adequate    SPECIAL CARE FACTORS FREQUENCY  PT (By licensed PT), OT (By licensed OT)     PT Frequency:  (5x/week) OT Frequency:  (5x/week)            Contractures Contractures Info: Not present    Additional Factors Info  Code Status, Allergies Code Status Info:  (Full ) Allergies Info:  (Flexeril)           Current Medications (04/12/2016):  This is the current hospital active medication list Current Facility-Administered Medications  Medication Dose Route Frequency Provider Last Rate Last Dose  . 0.9 %  sodium chloride infusion   Intravenous Continuous Newt Minion, MD 10 mL/hr at 04/11/16 1700    . acetaminophen (TYLENOL) tablet 650 mg  650 mg Oral Q6H PRN Newt Minion, MD   650 mg at 04/11/16 1429   Or  . acetaminophen (TYLENOL) suppository 650 mg  650 mg Rectal Q6H PRN Newt Minion, MD      . albuterol (PROVENTIL) (2.5 MG/3ML) 0.083% nebulizer solution 1.5 mL  1.5 mL Nebulization Q6H PRN Newt Minion, MD      . allopurinol (  ZYLOPRIM) tablet 300 mg  300 mg Oral Daily Newt Minion, MD   300 mg at 04/12/16 1029  . amLODipine (NORVASC) tablet 10 mg  10 mg Oral Daily Newt Minion, MD   10 mg at 04/12/16 1029  . aspirin EC tablet 81 mg  81 mg Oral Daily Newt Minion, MD   81 mg at 04/12/16 1029  . carbamazepine (TEGRETOL) tablet 200 mg  200 mg Oral Daily Gaynell Face Hiatt, RPH   200 mg at 04/12/16 1029  . carbamazepine (TEGRETOL) tablet 300 mg  300 mg Oral QHS Gaynell Face Hiatt, RPH   300 mg at 04/11/16 2125  . docusate sodium (COLACE) capsule 100 mg  100 mg Oral BID Newt Minion, MD   100 mg at 04/12/16  1029  . furosemide (LASIX) tablet 20 mg  20 mg Oral Daily Newt Minion, MD   20 mg at 04/12/16 1029  . HYDROmorphone (DILAUDID) injection 1 mg  1 mg Intravenous Q2H PRN Newt Minion, MD   1 mg at 04/11/16 1942  . insulin aspart (novoLOG) injection 0-15 Units  0-15 Units Subcutaneous TID WC Newt Minion, MD   2 Units at 04/12/16 1226  . losartan (COZAAR) tablet 100 mg  100 mg Oral Daily Newt Minion, MD   100 mg at 04/12/16 1029  . metFORMIN (GLUCOPHAGE) tablet 1,000 mg  1,000 mg Oral BID WC Newt Minion, MD   1,000 mg at 04/12/16 0758  . methocarbamol (ROBAXIN) tablet 500 mg  500 mg Oral Q6H PRN Newt Minion, MD   500 mg at 04/12/16 0758   Or  . methocarbamol (ROBAXIN) 500 mg in dextrose 5 % 50 mL IVPB  500 mg Intravenous Q6H PRN Newt Minion, MD      . metoCLOPramide (REGLAN) tablet 5-10 mg  5-10 mg Oral Q8H PRN Newt Minion, MD       Or  . metoCLOPramide (REGLAN) injection 5-10 mg  5-10 mg Intravenous Q8H PRN Newt Minion, MD      . metoprolol (LOPRESSOR) tablet 200 mg  200 mg Oral BID Newt Minion, MD   200 mg at 04/12/16 1028  . morphine 2 MG/ML injection 2 mg  2 mg Intravenous Q2H PRN Newt Minion, MD   2 mg at 04/12/16 1027  . nitroGLYCERIN (NITROSTAT) SL tablet 0.4 mg  0.4 mg Sublingual Q5 min PRN Newt Minion, MD      . ondansetron [facility]) tablet 4 mg  4 mg Oral Q6H PRN Newt Minion, MD       Or  . ondansetron [facility]) injection 4 mg  4 mg Intravenous Q6H PRN Newt Minion, MD      . oxyCODONE (Oxy IR/ROXICODONE) immediate release tablet 5-10 mg  5-10 mg Oral Q3H PRN Newt Minion, MD   10 mg at 04/12/16 0758  . pantoprazole (PROTONIX) EC tablet 40 mg  40 mg Oral Daily Newt Minion, MD   40 mg at 04/12/16 1029  . senna (SENOKOT) tablet 17.2 mg  2 tablet Oral QHS Newt Minion, MD   17.2 mg at 04/11/16 2125  . traZODone (DESYREL) tablet 150 mg  150 mg Oral QHS Newt Minion, MD   150 mg at 04/11/16 2125     Discharge Medications: Please see discharge summary for a list  of discharge medications.  Relevant Imaging Results:  Relevant Lab Results:   Additional Information SS#:[id number]  [phone]  Eulis Manly, LCSW

## 2016-04-12 NOTE — Progress Notes (Signed)
Occupational Therapy Evaluation Patient Details Name: Leslie [name] MRN: [id number] DOB: 09-Dec-1945 Today's Date: 04/12/2016    History of Present Illness Patient is a 70 year old gentleman status post vascular catheterization for limb salvage intervention for chronic limb ischemia to the left lower extremity. Patient attempted foot salvage intervention with ray amputations of the left foot. 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 Pt will benefit form rehab at SNF to facilitate safe return home @ mod I level. Will follow acutely to address established goals and maximize functional level of independence.  Bloody drainage noted on pillow. Nsg notified.     Follow Up Recommendations  SNF;Supervision/Assistance - 24 hour    Equipment Recommendations  Other (comment) (TBA at SNF)    Recommendations for Other Services       Precautions / Restrictions Precautions Precautions: Fall Precaution Comments: no pillow under R knee Restrictions LLE Weight Bearing: Non weight bearing      Mobility Bed Mobility Overal bed mobility: Modified Independent             General bed mobility comments: sitting at EOB on arrival (HOB elevated, holding rail)  Transfers Overall transfer level: Needs assistance Equipment used: None Transfers: Squat Pivot Transfers     Squat pivot transfers: Min guard     General transfer comment: Pt up in chair. Able to push to squat position without physical assist    Balance Overall balance assessment: Needs assistance Sitting-balance support: Single extremity supported;Feet unsupported Sitting balance-Leahy Scale: Fair                                      ADL       Grooming: Set up;Sitting   Upper Body Bathing: Set up;Sitting   Lower Body Bathing: Minimal assistance;Sitting/lateral  leans   Upper Body Dressing : Set up;Sitting   Lower Body Dressing: Minimal assistance;Sitting/lateral leans   Toilet Transfer: Minimal assistance   Toileting- Clothing Manipulation and Hygiene:  (able to manipulate urinal independently)       Functional mobility during ADLs: Minimal assistance (squat pivot) General ADL Comments: Decreased safety judgemtn. PT reports pt trying to transfer into chair independently with IV pulling on arm and chair not locked.     Vision     Perception     Praxis      Pertinent Vitals/Pain Pain Assessment: Faces Faces Pain Scale: Hurts little more Pain Location: L BLA Pain Descriptors / Indicators: Discomfort;Guarding;Sore Pain Intervention(s): Limited activity within patient's tolerance;Repositioned     Hand Dominance     Extremity/Trunk Assessment Upper Extremity Assessment Upper Extremity Assessment: Overall WFL for tasks assessed   Lower Extremity Assessment Lower Extremity Assessment: Defer to PT evaluation RLE Sensation: history of peripheral neuropathy LLE Deficits / Details: full knee extension despite pillow under his knee. Repositioned pillow distal to his knee and he reported no incr pain. Educated re: importance of maintaining knee extension ROM for prosthetic (his stated goal)   Cervical / Trunk Assessment Cervical / Trunk Assessment: Normal Cervical / Trunk Exceptions: obese   Communication Communication Communication: HOH   Cognition Arousal/Alertness: Awake/alert Behavior During Therapy: Impulsive Overall Cognitive Status: No family/caregiver present to determine baseline cognitive functioning (decreased safety/judgement)  General Comments       Exercises Exercises: Other exercises Other Exercises Other Exercises: educated pt on importance of not placing pillow under R knee Other Exercises: Began education on desnsitization of R residual limb Other Exercises: chair push ups x 5    Shoulder Instructions      Home Living Family/patient expects to be discharged to:: Skilled nursing facility Living Arrangements: Alone                                      Prior Functioning/Environment Level of Independence: Needs assistance  Gait / Transfers Assistance Needed: mostly using wheelchair, but also walking with RW (shorter and shorter distances) ADL's / Homemaking Assistance Needed: friend assisted with groceries, laundry; was independent with ADL        OT Diagnosis: Generalized weakness;Acute pain   OT Problem List: Decreased strength;Decreased range of motion;Decreased activity tolerance;Impaired balance (sitting and/or standing);Decreased safety awareness;Decreased knowledge of use of DME or AE;Decreased knowledge of precautions;Obesity;Pain   OT Treatment/Interventions: Self-care/ADL training;Therapeutic exercise;Energy conservation;DME and/or AE instruction;Therapeutic activities;Patient/family education;Balance training    OT Goals(Current goals can be found in the care plan section) Acute Rehab OT Goals Patient Stated Goal: to be independent again OT Goal Formulation: With patient Time For Goal Achievement: 04/26/16 Potential to Achieve Goals: Good ADL Goals Pt Will Perform Lower Body Bathing: with supervision;with set-up;sitting/lateral leans Pt Will Perform Lower Body Dressing: with set-up;with supervision;sitting/lateral leans Pt Will Transfer to Toilet: with supervision;bedside commode;squat pivot transfer Pt Will Perform Toileting - Clothing Manipulation and hygiene: with supervision;sitting/lateral leans Additional ADL Goal #1: Pt will independently verbalize importance of desensitization of R residaul limb to reduce pain  OT Frequency: Min 2X/week   Barriers to D/C: Decreased caregiver support          Co-evaluation              End of Session Equipment Utilized During Treatment: Oxygen Nurse Communication: Mobility  status;Other (comment) (drainage from residual limb)  Activity Tolerance: Patient tolerated treatment well Patient left: in chair;with call bell/phone within reach;with chair alarm set   Time: 1425-1436 OT Time Calculation (min): 11 min Charges:  OT General Charges $OT Visit: 1 Procedure OT Evaluation $OT Eval Low Complexity: 1 Procedure G-Codes:    Zanayah Shadowens,HILLARY 05/12/2016, 3:09 PM   [facility], OTR/L  [phone] 05/12/2016

## 2016-04-12 NOTE — NC FL2 (Signed)
[place] LEVEL OF CARE SCREENING TOOL     IDENTIFICATION  Patient Name: Leslie [name] Birthdate: 1946-06-04 Sex: male Admission Date (Current Location): 04/11/2016  [facility] and [place] Number:  Herbalist and Address:  [facility] [place] [address], [place], [place] [address]      Provider Number: [id number]  Attending Physician Name and Address:  Newt Minion, MD  Relative Name and Phone Number:       Current Level of Care: Hospital Recommended Level of Care: [place] Prior Approval Number:    Date Approved/Denied:   PASRR Number:    Discharge Plan: SNF    Current Diagnoses: Patient Active Problem List   Diagnosis Date Noted  . Below knee amputation status ([place]) 04/11/2016  . History of tobacco abuse 03/31/2016  . Osteomyelitis of left foot ([place])   . 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 Septic arthritis of left foot ([place]) 03/27/2016  . Solitary pulmonary nodule 08/14/2015  . Precordial chest pain 08/13/2015  . Peripheral arterial disease ([place]) 04/18/2015  . 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 Right anterior knee pain 11/19/2012  . Toenail deformity 01/16/2012  . Gout   . Diabetic neuropathy ([place]) 03/31/2008  . ALCOHOLISM 03/31/2008  . Rosacea 03/31/2008  . GERD 01/31/2008  . COPD (chronic obstructive pulmonary disease) ([place]) 08/03/2007  . DM W/COMPLICATION NOS, TYPE II 05/08/2007  . OBESITY 05/08/2007  . HTN (hypertension) 05/08/2007  . 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 HISTORY OF ASBESTOS EXPOSURE 05/08/2007  . Chronic lower back pain on narcotics  06/16/2005  . HLD (hyperlipidemia) 03/22/2002    Orientation RESPIRATION BLADDER Height & Weight     Self, Time, Situation, Place  Normal Continent Weight: 224 lb (101.606 kg) Height:     BEHAVIORAL SYMPTOMS/MOOD NEUROLOGICAL BOWEL NUTRITION STATUS   (None)   Continent Diet (Carb modified)  AMBULATORY STATUS COMMUNICATION OF NEEDS Skin   Extensive  Assist Verbally Surgical wounds                       Personal Care Assistance Level of Assistance  Bathing, Feeding, Dressing Bathing Assistance: Limited assistance Feeding assistance: Independent Dressing Assistance: Limited assistance     Functional Limitations Info  Sight, Hearing, Speech Sight Info: Adequate Hearing Info: Adequate Speech Info: Adequate    SPECIAL CARE FACTORS FREQUENCY  PT (By licensed PT), OT (By licensed OT)     PT Frequency:  (5x/week) OT Frequency:  (5x/week)            Contractures Contractures Info: Not present    Additional Factors Info  Code Status, Allergies Code Status Info:  (Full ) Allergies Info:  (Flexeril)           Current Medications (04/12/2016):  This is the current hospital active medication list Current Facility-Administered Medications  Medication Dose Route Frequency Provider Last Rate Last Dose  . 0.9 %  sodium chloride infusion   Intravenous Continuous Newt Minion, MD 10 mL/hr at 04/11/16 1700    . acetaminophen (TYLENOL) tablet 650 mg  650 mg Oral Q6H PRN Newt Minion, MD   650 mg at 04/11/16 1429   Or  . acetaminophen (TYLENOL) suppository 650 mg  650 mg Rectal Q6H PRN Newt Minion, MD      . albuterol (PROVENTIL) (2.5 MG/3ML) 0.083% nebulizer solution 1.5 mL  1.5 mL Nebulization Q6H PRN Newt Minion, MD      . allopurinol (ZYLOPRIM)  tablet 300 mg  300 mg Oral Daily Newt Minion, MD   300 mg at 04/12/16 1029  . amLODipine (NORVASC) tablet 10 mg  10 mg Oral Daily Newt Minion, MD   10 mg at 04/12/16 1029  . aspirin EC tablet 81 mg  81 mg Oral Daily Newt Minion, MD   81 mg at 04/12/16 1029  . carbamazepine (TEGRETOL) tablet 200 mg  200 mg Oral Daily Gaynell Face Hiatt, RPH   200 mg at 04/12/16 1029  . carbamazepine (TEGRETOL) tablet 300 mg  300 mg Oral QHS Gaynell Face Hiatt, RPH   300 mg at 04/11/16 2125  . docusate sodium (COLACE) capsule 100 mg  100 mg Oral BID Newt Minion, MD   100 mg at 04/12/16 1029  .  furosemide (LASIX) tablet 20 mg  20 mg Oral Daily Newt Minion, MD   20 mg at 04/12/16 1029  . HYDROmorphone (DILAUDID) injection 1 mg  1 mg Intravenous Q2H PRN Newt Minion, MD   1 mg at 04/11/16 1942  . insulin aspart (novoLOG) injection 0-15 Units  0-15 Units Subcutaneous TID WC Newt Minion, MD   2 Units at 04/12/16 1226  . losartan (COZAAR) tablet 100 mg  100 mg Oral Daily Newt Minion, MD   100 mg at 04/12/16 1029  . metFORMIN (GLUCOPHAGE) tablet 1,000 mg  1,000 mg Oral BID WC Newt Minion, MD   1,000 mg at 04/12/16 0758  . methocarbamol (ROBAXIN) tablet 500 mg  500 mg Oral Q6H PRN Newt Minion, MD   500 mg at 04/12/16 0758   Or  . methocarbamol (ROBAXIN) 500 mg in dextrose 5 % 50 mL IVPB  500 mg Intravenous Q6H PRN Newt Minion, MD      . metoCLOPramide (REGLAN) tablet 5-10 mg  5-10 mg Oral Q8H PRN Newt Minion, MD       Or  . metoCLOPramide (REGLAN) injection 5-10 mg  5-10 mg Intravenous Q8H PRN Newt Minion, MD      . metoprolol (LOPRESSOR) tablet 200 mg  200 mg Oral BID Newt Minion, MD   200 mg at 04/12/16 1028  . morphine 2 MG/ML injection 2 mg  2 mg Intravenous Q2H PRN Newt Minion, MD   2 mg at 04/12/16 1027  . nitroGLYCERIN (NITROSTAT) SL tablet 0.4 mg  0.4 mg Sublingual Q5 min PRN Newt Minion, MD      . ondansetron [facility]) tablet 4 mg  4 mg Oral Q6H PRN Newt Minion, MD       Or  . ondansetron [facility]) injection 4 mg  4 mg Intravenous Q6H PRN Newt Minion, MD      . oxyCODONE (Oxy IR/ROXICODONE) immediate release tablet 5-10 mg  5-10 mg Oral Q3H PRN Newt Minion, MD   10 mg at 04/12/16 0758  . pantoprazole (PROTONIX) EC tablet 40 mg  40 mg Oral Daily Newt Minion, MD   40 mg at 04/12/16 1029  . senna (SENOKOT) tablet 17.2 mg  2 tablet Oral QHS Newt Minion, MD   17.2 mg at 04/11/16 2125  . traZODone (DESYREL) tablet 150 mg  150 mg Oral QHS Newt Minion, MD   150 mg at 04/11/16 2125     Discharge Medications: Please see discharge summary for a list of  discharge medications.  Relevant Imaging Results:  Relevant Lab Results:   Additional Information SS#:[id number]  Rexene Alberts  Truman Hayward, LCSW

## 2016-04-12 NOTE — Evaluation (Signed)
Physical Therapy Evaluation Patient Details Name: Leslie [name] MRN: [id number] DOB: 1945-11-23 Today's Date: 04/12/2016   History of Present Illness  Patient is a 70 year old gentleman status post vascular catheterization for limb salvage intervention for chronic limb ischemia to the left lower extremity. Patient attempted foot salvage intervention with ray amputations of the left foot. 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 Patient very impulsive with decr safety awareness. Patient will benefit from skilled PT to increase their independence and safety with mobility to allow discharge to the venue listed below.       Follow Up Recommendations SNF (pt has decided this is best option for his rehab)    Equipment Recommendations  None recommended by PT    Recommendations for Other Services       Precautions / Restrictions Precautions Precautions: Fall Restrictions LLE Weight Bearing: Non weight bearing      Mobility  Bed Mobility Overal bed mobility: Modified Independent             General bed mobility comments: sitting at EOB on arrival (HOB elevated, holding rail)  Transfers Overall transfer level: Needs assistance Equipment used: None Transfers: Squat Pivot Transfers     Squat pivot transfers: Min guard     General transfer comment: pt transferred to his Rt with no physical assist; minguard for safety and equipment (IV, O2, locking recliner  Ambulation/Gait                Stairs            Wheelchair Mobility    Modified Rankin (Stroke Patients Only)       Balance Overall balance assessment: Needs assistance Sitting-balance support: Single extremity supported;Feet unsupported Sitting balance-Leahy Scale: Fair                                       Pertinent  Vitals/Pain Pain Assessment: Faces Faces Pain Scale: Hurts little more Pain Location: Lt BKA incision Pain Descriptors / Indicators: Operative site guarding Pain Intervention(s): Limited activity within patient's tolerance;Repositioned    Home Living Family/patient expects to be discharged to:: Skilled nursing facility Living Arrangements: Alone                    Prior Function Level of Independence: Needs assistance   Gait / Transfers Assistance Needed: mostly using wheelchair, but also walking with RW (shorter and shorter distances)  ADL's / Homemaking Assistance Needed: friend assisted with groceries, laundry        Hand Dominance        Extremity/Trunk Assessment   Upper Extremity Assessment: Overall WFL for tasks assessed;Defer to OT evaluation           Lower Extremity Assessment: RLE deficits/detail;LLE deficits/detail   LLE Deficits / Details: full knee extension despite pillow under his knee. Repositioned pillow distal to his knee and he reported no incr pain. Educated re: importance of maintaining knee extension ROM for prosthetic (his stated goal)  Cervical / Trunk Assessment: Other exceptions  Communication   Communication: HOH  Cognition Arousal/Alertness: Awake/alert Behavior During Therapy: Impulsive Overall Cognitive Status: Within Functional Limits for tasks assessed  General Comments      Exercises        Assessment/Plan    PT Assessment Patient needs continued PT services  PT Diagnosis Acute pain;Difficulty walking   PT Problem List Decreased strength;Decreased activity tolerance;Decreased balance;Decreased mobility;Decreased knowledge of use of DME;Decreased safety awareness;Decreased knowledge of precautions;Impaired sensation;Obesity;Pain;Decreased skin integrity  PT Treatment Interventions DME instruction;Gait training;Functional mobility training;Therapeutic activities;Therapeutic exercise;Balance  training;Cognitive remediation;Patient/family education   PT Goals (Current goals can be found in the Care Plan section) Acute Rehab PT Goals Patient Stated Goal: to get a prosthetic leg one day PT Goal Formulation: With patient Time For Goal Achievement: 04/19/16 Potential to Achieve Goals: Good    Frequency Min 3X/week   Barriers to discharge Decreased caregiver support      Co-evaluation               End of Session Equipment Utilized During Treatment: Oxygen Activity Tolerance: Patient tolerated treatment well Patient left: in chair;with call bell/phone within reach;with chair alarm set;Other (comment) (OT arriving for OT eval) Nurse Communication: Mobility status (attempting OOB on his own )         Time: [id number] PT Time Calculation (min) (ACUTE ONLY): 10 min   Charges:   PT Evaluation $PT Eval Low Complexity: 1 Procedure     PT G Codes:        Leslie [name] Apr 16, 2016, 2:52 PM Pager [phone]

## 2016-04-13 LAB — GLUCOSE, CAPILLARY
GLUCOSE-CAPILLARY: 138 mg/dL — AB (ref 65–99)
GLUCOSE-CAPILLARY: 152 mg/dL — AB (ref 65–99)
Glucose-Capillary: 139 mg/dL — ABNORMAL HIGH (ref 65–99)
Glucose-Capillary: 144 mg/dL — ABNORMAL HIGH (ref 65–99)

## 2016-04-13 MED ORDER — FLEET ENEMA 7-19 GM/118ML RE ENEM: 1.0000 | ENEMA | Freq: Every day | RECTAL | Status: DC | PRN

## 2016-04-13 MED ORDER — MAGNESIUM CITRATE PO SOLN
1.0000 | Freq: Once | ORAL | Status: AC
  Administered 2016-04-13: 1 via ORAL
  Filled 2016-04-13: qty 296

## 2016-04-13 MED ORDER — BISACODYL 5 MG PO TBEC: 5.0000 mg | DELAYED_RELEASE_TABLET | Freq: Every day | ORAL | Status: DC | PRN

## 2016-04-13 NOTE — Progress Notes (Signed)
Patient stable sitting in wheelchair he was better and more comfortable today than yesterday  Stump dressing dry he is encouraged to wear his black stump shrinker  Anticipate discharge this week

## 2016-04-14 ENCOUNTER — Encounter (HOSPITAL_COMMUNITY): Payer: Self-pay | Admitting: Orthopedic Surgery

## 2016-04-14 LAB — GLUCOSE, CAPILLARY
GLUCOSE-CAPILLARY: 132 mg/dL — AB (ref 65–99)
GLUCOSE-CAPILLARY: 117 mg/dL — AB (ref 65–99)

## 2016-04-14 MED ORDER — OXYCODONE-ACETAMINOPHEN 5-325 MG PO TABS: 1.0000 | ORAL_TABLET | ORAL | Status: DC | PRN

## 2016-04-14 NOTE — Clinical Social Work Note (Signed)
Patient will discharge today per MD order. Patient will discharge to [facility] RN to call report prior to transportation to: [phone] Transportation: PTAR to be arranged at 1pm per SNF  CSW sent discharge summary to SNF for review.    Nonnie Done, LCSW [phone]  5N1-9, 2S 15-16 and Psychiatric Service Line  Licensed Clinical Social Worker

## 2016-04-14 NOTE — Care Management Important Message (Signed)
Important Message  Patient Details  Name: Leslie [name] MRN: [id number] Date of Birth: 1946-01-05   Medicare Important Message Given:  Yes    Loann Quill 04/14/2016, 10:25 AM

## 2016-04-14 NOTE — Progress Notes (Signed)
Went to retrieve pt money and key from security

## 2016-04-14 NOTE — Progress Notes (Signed)
Called to give report to [facility] for Mr Leslie [name] spoke with Rollene Fare

## 2016-04-14 NOTE — Discharge Summary (Signed)
Physician Discharge Summary  Patient ID: Leslie [name] MRN: [id number] DOB/AGE: [birth year] 70 y.o.  Admit date: 04/11/2016 Discharge date: 04/14/2016  Admission Diagnoses:Gangrene left foot with osteomyelitis  Discharge Diagnoses:  Active Problems:   Below knee amputation status [facility])   Discharged Condition: stable  Hospital Course: Patient's hospital course was essentially unremarkable. He underwent a transtibial amputation. Postoperatively patient progressed slowly with therapy and was discharged to skilled nursing.  Consults: None  Significant Diagnostic Studies: labs: Routine labs  Treatments: surgery: See operative note  Discharge Exam: Blood pressure 176/76, pulse 71, temperature 97.6 F (36.4 C), temperature source Oral, resp. rate 16, weight 101.606 kg (224 lb), SpO2 96 %. Incision/Wound: Dressing clean and dry  Disposition: 01-Home or Self Care     Medication List    ASK your doctor about these medications        albuterol 108 (90 Base) MCG/ACT inhaler  Commonly known as:  PROVENTIL HFA;VENTOLIN HFA  Inhale 2 puffs into the lungs every 4 (four) hours as needed for shortness of breath.     albuterol 1.25 MG/3ML nebulizer solution  Commonly known as:  ACCUNEB  Take 1 ampule by nebulization every 6 (six) hours as needed for wheezing.     allopurinol 300 MG tablet  Commonly known as:  ZYLOPRIM  take 1 tablet by mouth once daily     amLODipine 10 MG tablet  Commonly known as:  NORVASC  Take 1 tablet (10 mg total) by mouth daily.     aspirin 81 MG tablet  Take 81 mg by mouth daily.     carbamazepine 200 MG tablet  Commonly known as:  TEGRETOL  Take 200-300 mg by mouth 2 (two) times daily. Take one tablet (200mg ) in the morning and one and a half tablets (300mg ) in the evening.     cholecalciferol 1000 units tablet  Commonly known as:  VITAMIN D  Take 1,000 Units by mouth daily.     CLONIDINE HCL TD  Place 1 patch onto the skin every Monday.     cyanocobalamin 1000 MCG/ML injection  Commonly known as:  (VITAMIN B-12)  Inject 1,000 mcg into the muscle every 30 (thirty) days.     docusate sodium 100 MG capsule  Commonly known as:  COLACE  Take 100 mg by mouth 2 (two) times daily.     furosemide 20 MG tablet  Commonly known as:  LASIX  take 1 tablet by mouth once daily     HYDROcodone-acetaminophen 10-325 MG tablet  Commonly known as:  NORCO  Take 1 tablet by mouth every 8 (eight) hours as needed for pain.     losartan 100 MG tablet  Commonly known as:  COZAAR  Take 100 mg by mouth daily.     metFORMIN 1000 MG tablet  Commonly known as:  GLUCOPHAGE  Take 1 tablet (1,000 mg total) by mouth 2 (two) times daily with a meal.     metoprolol 100 MG tablet  Commonly known as:  LOPRESSOR  Take 2 tablets (200 mg total) by mouth 2 (two) times daily.     nitroGLYCERIN 0.4 MG SL tablet  Commonly known as:  NITROSTAT  Place 0.4 mg under the tongue every 5 (five) minutes as needed for chest pain.     pantoprazole 40 MG tablet  Commonly known as:  PROTONIX  Take 40 mg by mouth daily.     senna 8.6 MG tablet  Commonly known as:  SENOKOT  Take 2 tablets by mouth  at bedtime.     traZODone 100 MG tablet  Commonly known as:  DESYREL  Take 150 mg by mouth at bedtime.           Follow-up Information    Follow up with English Craighead V, MD In 1 week.   Specialty:  Orthopedic Surgery   Contact information:   [place] [address] [phone]       Signed: Newt Minion 04/14/2016, 6:38 AM

## 2016-04-14 NOTE — Clinical Social Work Placement (Signed)
   CLINICAL SOCIAL WORK PLACEMENT  NOTE  Date:  04/14/2016  Patient Details  Name: Leslie [name] MRN: [id number] Date of Birth: [birth year]  Clinical Social Work is seeking post-discharge placement for this patient at the [place] level of care (*CSW will initial, date and re-position this form in  chart as items are completed):  Yes   Patient/family provided with [facility] Work Department's list of facilities offering this level of care within the geographic area requested by the patient (or if unable, by the patient's family).  Yes   Patient/family informed of their freedom to choose among providers that offer the needed level of care, that participate in Medicare, Medicaid or managed care program needed by the patient, have an available bed and are willing to accept the patient.  Yes   Patient/family informed of [facility]'s ownership interest in [facility] and [facility], as well as of the fact that they are under no obligation to receive care at these facilities.  PASRR submitted to EDS on 04/12/16     PASRR number received on 04/12/16     Existing PASRR number confirmed on       FL2 transmitted to all facilities in geographic area requested by pt/family on 04/12/16     FL2 transmitted to all facilities within larger geographic area on 04/12/16     Patient informed that his/her managed care company has contracts with or will negotiate with certain facilities, including the following:        Yes   Patient/family informed of bed offers received.  Patient chooses bed at [facility] recommends and patient chooses bed at [facility]    Patient to be transferred to [facility] on 04/14/16.  Patient to be transferred to facility by PTAR     Patient family notified on 04/14/16 of transfer.  Name of family member notified:  P[facility]- Raquel Sarna      PHYSICIAN Please prepare  priority discharge summary, including medications, Please prepare prescriptions, Please sign FL2     Additional Comment:    _______________________________________________ Dulcy Fanny, LCSW 04/14/2016, 11:44 AM

## 2016-04-14 NOTE — Progress Notes (Addendum)
Physical Therapy Treatment Patient Details Name: Leslie [name] MRN: [id number] DOB: 1946/01/31 Today's Date: 04/14/2016    History of Present Illness Patient is a 70 year old gentleman status post vascular catheterization for limb salvage intervention for chronic limb ischemia to the left lower extremity. Patient attempted foot salvage intervention with ray amputations of the left foot. 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  Pt tolerated 3 standing trials ranging one to 2 min in preparation to advance to gait training.  Pt slated for d/c today to SNF for continued rehab.     Follow Up Recommendations  SNF     Equipment Recommendations  None recommended by PT    Recommendations for Other Services       Precautions / Restrictions Precautions Precautions: Fall Precaution Comments: no pillow under R knee Restrictions Weight Bearing Restrictions: Yes LLE Weight Bearing: Non weight bearing    Mobility  Bed Mobility Overal bed mobility: Modified Independent             General bed mobility comments: Pt performed with good technique without assistance and heavy use of rails.    Transfers Overall transfer level: Needs assistance Equipment used: Rolling walker (2 wheeled) Transfers: Sit to/from Stand Sit to Stand: Min guard         General transfer comment: Pt performed 3 standing trials ranging from 1 min and 30 sec to 2 min and 48 sec.  Pt required cues for hand placement to and from seated surface.    Ambulation/Gait Ambulation/Gait assistance:  (did not perform.  )               Stairs            Wheelchair Mobility    Modified Rankin (Stroke Patients Only)       Balance                                    Cognition Arousal/Alertness: Awake/alert Behavior During Therapy: Impulsive;WFL for tasks  assessed/performed Overall Cognitive Status: Within Functional Limits for tasks assessed                      Exercises General Exercises - Lower Extremity Ankle Circles/Pumps: AROM;Right;10 reps;Supine Quad Sets: AROM;Both;10 reps;Supine Heel Slides: AROM;Both;10 reps;Supine Straight Leg Raises: AROM;Both;10 reps;Supine Amputee Exercises Towel Squeeze: AROM;Both;10 reps;Supine    General Comments        Pertinent Vitals/Pain Pain Assessment: 0-10 Pain Score: 3  Pain Location: L BKA Pain Descriptors / Indicators: Grimacing;Discomfort Pain Intervention(s): Monitored during session;Repositioned    Home Living                      Prior Function            PT Goals (current goals can now be found in the care plan section) Acute Rehab PT Goals Patient Stated Goal: to be independent again Potential to Achieve Goals: Good Progress towards PT goals: Progressing toward goals    Frequency  Min 3X/week    PT Plan Current plan remains appropriate    Co-evaluation             End of Session Equipment Utilized During Treatment: Oxygen;Gait belt Activity Tolerance: Patient tolerated treatment well Patient left: in bed;with bed alarm set;with call  bell/phone within reach     Time: 0915-0936 PT Time Calculation (min) (ACUTE ONLY): 21 min  Charges:  $Therapeutic Activity: 8-22 mins                    G Codes:      Cristela Blue 2016-05-12, 9:47 AM  Governor Rooks, PTA pager [phone]

## 2016-04-14 NOTE — Progress Notes (Signed)
Called to give report to [facility] spoke with regina

## 2016-12-17 ENCOUNTER — Other Ambulatory Visit: Payer: Self-pay | Admitting: Family Medicine

## 2016-12-17 DIAGNOSIS — R911 Solitary pulmonary nodule: Secondary | ICD-10-CM

## 2016-12-19 ENCOUNTER — Ambulatory Visit
Admission: RE | Admit: 2016-12-19 | Discharge: 2016-12-19 | Disposition: A | Discharge status: Home / Self Care | Payer: Medicare (Managed Care) | Source: Ambulatory Visit | Attending: Family Medicine | Admitting: Family Medicine

## 2016-12-19 DIAGNOSIS — R911 Solitary pulmonary nodule: Secondary | ICD-10-CM

## 2016-12-19 MED ORDER — IOPAMIDOL (ISOVUE-300) INJECTION 61%
75.0000 mL | Freq: Once | INTRAVENOUS | Status: AC | PRN
  Administered 2016-12-19: 75 mL via INTRAVENOUS

## 2017-02-23 ENCOUNTER — Ambulatory Visit
Admission: RE | Admit: 2017-02-23 | Discharge: 2017-02-23 | Disposition: A | Discharge status: Home / Self Care | Payer: Medicare (Managed Care) | Source: Ambulatory Visit | Attending: Nurse Practitioner | Admitting: Nurse Practitioner

## 2017-02-23 ENCOUNTER — Other Ambulatory Visit: Payer: Self-pay | Admitting: Nurse Practitioner

## 2017-02-23 DIAGNOSIS — R05 Cough: Secondary | ICD-10-CM

## 2017-02-23 DIAGNOSIS — R059 Cough, unspecified: Secondary | ICD-10-CM

## 2017-04-16 ENCOUNTER — Encounter (HOSPITAL_BASED_OUTPATIENT_CLINIC_OR_DEPARTMENT_OTHER): Payer: Medicare (Managed Care) | Attending: Internal Medicine

## 2017-04-16 DIAGNOSIS — E11621 Type 2 diabetes mellitus with foot ulcer: Secondary | ICD-10-CM | POA: Diagnosis not present

## 2017-04-16 DIAGNOSIS — I2510 Atherosclerotic heart disease of native coronary artery without angina pectoris: Secondary | ICD-10-CM | POA: Insufficient documentation

## 2017-04-16 DIAGNOSIS — E1142 Type 2 diabetes mellitus with diabetic polyneuropathy: Secondary | ICD-10-CM | POA: Diagnosis not present

## 2017-04-16 DIAGNOSIS — J449 Chronic obstructive pulmonary disease, unspecified: Secondary | ICD-10-CM | POA: Insufficient documentation

## 2017-04-16 DIAGNOSIS — E1151 Type 2 diabetes mellitus with diabetic peripheral angiopathy without gangrene: Secondary | ICD-10-CM | POA: Insufficient documentation

## 2017-04-16 DIAGNOSIS — Z89512 Acquired absence of left leg below knee: Secondary | ICD-10-CM | POA: Insufficient documentation

## 2017-04-16 DIAGNOSIS — I10 Essential (primary) hypertension: Secondary | ICD-10-CM | POA: Insufficient documentation

## 2017-04-16 DIAGNOSIS — L97411 Non-pressure chronic ulcer of right heel and midfoot limited to breakdown of skin: Secondary | ICD-10-CM | POA: Insufficient documentation

## 2017-04-16 DIAGNOSIS — Z87891 Personal history of nicotine dependence: Secondary | ICD-10-CM | POA: Diagnosis not present

## 2017-04-23 ENCOUNTER — Other Ambulatory Visit: Payer: Self-pay | Admitting: Cardiovascular Disease

## 2017-04-23 DIAGNOSIS — E11621 Type 2 diabetes mellitus with foot ulcer: Secondary | ICD-10-CM | POA: Diagnosis not present

## 2017-04-23 DIAGNOSIS — I739 Peripheral vascular disease, unspecified: Secondary | ICD-10-CM

## 2017-04-27 ENCOUNTER — Ambulatory Visit (HOSPITAL_COMMUNITY)
Admission: RE | Admit: 2017-04-27 | Discharge: 2017-04-27 | Disposition: A | Discharge status: Home / Self Care | Payer: Medicare (Managed Care) | Source: Ambulatory Visit | Attending: Cardiology | Admitting: Cardiology

## 2017-04-27 DIAGNOSIS — I739 Peripheral vascular disease, unspecified: Secondary | ICD-10-CM | POA: Diagnosis not present

## 2017-04-30 ENCOUNTER — Telehealth: Payer: Self-pay | Admitting: Cardiovascular Disease

## 2017-04-30 NOTE — Telephone Encounter (Signed)
Pt living at [facility]. Spoke to pt to let him know he had not been seen since 2016. Dr. Gwenlyn Found reviewed his doppler results. Asked pt if he would like to schedule an appt. Pt told me to set it up with [facility].  Called [facility], scheduled pt for Tuesday, 7/10 at 8 am with Dr. Gwenlyn Found.  Routing to Dr. Gwenlyn Found as Juluis Rainier.

## 2017-05-05 ENCOUNTER — Ambulatory Visit: Payer: Medicare (Managed Care) | Admitting: Cardiovascular Disease

## 2017-05-05 ENCOUNTER — Encounter: Payer: Self-pay | Admitting: *Deleted

## 2017-05-07 ENCOUNTER — Encounter (HOSPITAL_BASED_OUTPATIENT_CLINIC_OR_DEPARTMENT_OTHER): Payer: Medicare (Managed Care) | Attending: Internal Medicine

## 2017-05-07 DIAGNOSIS — I10 Essential (primary) hypertension: Secondary | ICD-10-CM | POA: Diagnosis not present

## 2017-05-07 DIAGNOSIS — E11621 Type 2 diabetes mellitus with foot ulcer: Secondary | ICD-10-CM | POA: Diagnosis not present

## 2017-05-07 DIAGNOSIS — L97419 Non-pressure chronic ulcer of right heel and midfoot with unspecified severity: Secondary | ICD-10-CM | POA: Diagnosis not present

## 2017-05-07 DIAGNOSIS — I2510 Atherosclerotic heart disease of native coronary artery without angina pectoris: Secondary | ICD-10-CM | POA: Insufficient documentation

## 2017-05-07 DIAGNOSIS — J449 Chronic obstructive pulmonary disease, unspecified: Secondary | ICD-10-CM | POA: Insufficient documentation

## 2017-05-11 ENCOUNTER — Telehealth (INDEPENDENT_AMBULATORY_CARE_PROVIDER_SITE_OTHER): Payer: Self-pay | Admitting: Orthopedic Surgery

## 2017-05-11 NOTE — Telephone Encounter (Signed)
[name] called requesting notes within last 30 days. I advised that we haven't seen patient since 05/2016.  She said she will call back if she needs anything else.

## 2017-05-27 ENCOUNTER — Encounter: Payer: Self-pay | Admitting: Cardiovascular Disease

## 2017-05-27 ENCOUNTER — Ambulatory Visit (INDEPENDENT_AMBULATORY_CARE_PROVIDER_SITE_OTHER): Payer: Medicare (Managed Care) | Admitting: Cardiovascular Disease

## 2017-05-27 VITALS — BP 162/68 | HR 66 | Ht 66.0 in | Wt 233.2 lb

## 2017-05-27 DIAGNOSIS — E7800 Pure hypercholesterolemia, unspecified: Secondary | ICD-10-CM

## 2017-05-27 DIAGNOSIS — I739 Peripheral vascular disease, unspecified: Secondary | ICD-10-CM | POA: Diagnosis not present

## 2017-05-27 DIAGNOSIS — I10 Essential (primary) hypertension: Secondary | ICD-10-CM

## 2017-05-27 NOTE — Assessment & Plan Note (Signed)
History of peripheral arterial disease status post left below the knee amputation by Dr. due to 04/11/16 because of a nonhealing wound. He lives in independent living. He walks with the aid of to a walker. He did have lower extremity peripheral Doppler studies that showed a right ABI of 0.85 and an occluded proximal right SFA. He does not have an open wound on the right side nor do I think he ambulate enough to experience lifestyle limiting claudication. At this point, we'll continue to follow him conservatively.

## 2017-05-27 NOTE — Patient Instructions (Signed)

## 2017-05-27 NOTE — Assessment & Plan Note (Signed)
History of essential hypertension blood pressure measured at 162/68. He is on amlodipine, losartan and metoprolol. Continue current meds at current dosing

## 2017-05-27 NOTE — Assessment & Plan Note (Signed)
History of hyperlipidemia not on statin therapy. 

## 2017-05-27 NOTE — Progress Notes (Signed)
05/27/2017 Leslie [name]   Jun 21, 1946  [id number]  Primary Physician [name], MD Primary Cardiologist: Lorretta Harp MD FACP, [place], [place], [place]  HPI:  Leslie [name] is a 71 y.o. male  moderately overweight widowed Caucasian male with no children who lives alone in an independent living environment and was referred by Dr. Jake Bathe for peripheral vascular evaluation. 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 He has 50-80 pack years history of tobacco use having quit 13 years ago. He also has treated diabetes, hypertension and hyperlipidemia. He has peripheral neuropathy. He underwent lower lower extremity angiography by Dr. Burt Knack 04/18/13 revealing noncritical PAD with predominantly tibial vessel disease. On the left the posterior tibial was occluded which reconstituted above the ankle and the anterior tibial and pedal arteries were patent with diffuse disease. On the right the anterior tibial had a 75% ostial stenosis with three-vessel disease. He denies claudication. He has a slowly healing wound on the lateral aspect of his right fifth metatarsal which is being addressed at the [facility]. He had recent ABIs performed on 03/27/15 which revealed a right ABI of 0.8 and left 0.78 although these were probably artificially elevated because of calcified noncompressible vessels. I'm going to repeat lower extremity arterial Doppler studies. Since I saw him 2 months ago he developed recurrent wound on his left foot which is currently wrapped. Follow-up Dopplers performed in the office 05/11/15 revealed a right ABI 0.81 with high-frequency signal in his proximal right SFA and popliteal artery, left ABI 0.88 with a hypertensive signal in his mid left SFA. Since I saw him in the office 2 years ago he has undergone left BKA by Dr. Sharol Given  04/11/16 for what appears to be a nonhealing ulcer/critical limb ischemia. He  wears a prosthesis and walks with the aid of a walker. Recent lower extremity arterial Doppler studies performed 04/27/17 revealed a right ABI of 0.85 with an occluded proximal right SFA  Current Meds  Medication Sig  . albuterol (ACCUNEB) 1.25 MG/3ML nebulizer solution Take 1 ampule by nebulization every 6 (six) hours as needed for wheezing.  Marland Kitchen albuterol (PROVENTIL HFA;VENTOLIN HFA) 108 (90 Base) MCG/ACT inhaler Inhale 2 puffs into the lungs every 4 (four) hours as needed for shortness of breath.  . allopurinol (ZYLOPRIM) 300 MG tablet take 1 tablet by mouth once daily  . amLODipine (NORVASC) 10 MG tablet Take 1 tablet (10 mg total) by mouth daily.  Marland Kitchen aspirin 81 MG tablet Take 81 mg by mouth daily.  . carbamazepine (TEGRETOL) 200 MG tablet Take 200-300 mg by mouth 2 (two) times daily. Take one tablet (200mg ) in the morning and one and a half tablets (300mg ) in the evening.  . cholecalciferol (VITAMIN D) 1000 UNITS tablet Take 1,000 Units by mouth daily.  Marland Kitchen CLONIDINE HCL TD Place 1 patch onto the skin every Monday.   . cyanocobalamin (,VITAMIN B-12,) 1000 MCG/ML injection Inject 1,000 mcg into the muscle every 30 (thirty) days.  Marland Kitchen docusate sodium (COLACE) 100 MG capsule Take 100 mg by mouth 2 (two) times daily.  . furosemide (LASIX) 20 MG tablet take 1 tablet by mouth once daily  . HYDROcodone-acetaminophen (NORCO) 10-325 MG per tablet Take 1 tablet by mouth every 8 (eight) hours as needed for pain.  Marland Kitchen losartan (COZAAR) 100 MG tablet Take 100 mg by mouth daily.  . metFORMIN (GLUCOPHAGE)  1000 MG tablet Take 1 tablet (1,000 mg total) by mouth 2 (two) times daily with a meal.  . metoprolol (LOPRESSOR) 100 MG tablet Take 2 tablets (200 mg total) by mouth 2 (two) times daily.  . nitroGLYCERIN (NITROSTAT) 0.4 MG SL tablet Place 0.4 mg under the tongue every 5 (five) minutes as needed for chest pain.   Marland Kitchen oxyCODONE-acetaminophen (ROXICET) 5-325 MG tablet Take 1 tablet by mouth every 4 (four) hours as  needed for severe pain.  . pantoprazole (PROTONIX) 40 MG tablet Take 40 mg by mouth daily.  Marland Kitchen senna (SENOKOT) 8.6 MG tablet Take 2 tablets by mouth at bedtime.   . traZODone (DESYREL) 100 MG tablet Take 150 mg by mouth at bedtime.     Allergies  Allergen Reactions  . Flexeril [Cyclobenzaprine] Other (See Comments)    Dry mouth and hallucinations.     Social History   Social History  . Marital status: Widowed    Spouse name: N/A  . Number of children: N/A  . Years of education: N/A   Occupational History  . disabled    Social History Main Topics  . Smoking status: Former Smoker    Packs/day: 2.00    Years: 40.00    Types: Cigarettes    Quit date: 01/13/2002  . Smokeless tobacco: Current User    Types: Chew  . Alcohol use 0.6 oz/week    1 Cans of beer per week     Comment: 3 beers/night on some nights. Previously 6-pack/night.  . Drug use: No     Comment: 03/27/2016 "last marijuana was maybe in 2013"  . Sexual activity: No   Other Topics Concern  . 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   Lives in assisted living. Performs ADLs and IADLs, including cooking and managing his medications. Friend drives him to the store. Uses rollator walker at home and motorized scooter in store.   [facility] patient   Widowed   5-8 years education   Likes to fish   Pets:  4 cockatiels and 2 lovebirds     Review of Systems: General: negative for chills, fever, night sweats or weight changes.  Cardiovascular: negative for chest pain, dyspnea on exertion, edema, orthopnea, palpitations, paroxysmal nocturnal dyspnea or shortness of breath Dermatological: negative for rash Respiratory: negative for cough or wheezing Urologic: negative for hematuria Abdominal: negative for nausea, vomiting, diarrhea, bright red blood per rectum, melena, or hematemesis Neurologic: negative for visual changes, syncope, or  dizziness All other systems reviewed and are otherwise negative except as noted above.    Blood pressure (!) 162/68, pulse 66, height 5\' 6"  (1.676 m), weight 233 lb 3.2 oz (105.8 kg).  General appearance: alert and no distress Neck: no adenopathy, no carotid bruit, no JVD, supple, symmetrical, trachea midline and thyroid not enlarged, symmetric, no tenderness/mass/nodules Lungs: clear to auscultation bilaterally Heart: regular rate and rhythm, S1, S2 normal, no murmur, click, rub or gallop Extremities: Left BKA  EKG sinus rhythm at 66 without ST or T-wave changes. I personally reviewed this EKG  ASSESSMENT AND PLAN:   HLD (hyperlipidemia) History of hyperlipidemia not on statin therapy.  HTN (hypertension) History of essential hypertension blood pressure measured at 162/68. He is on amlodipine, losartan and metoprolol. Continue current meds at current dosing  Peripheral arterial disease (HCC) History of peripheral arterial disease status post left below the knee amputation by Dr. due to 04/11/16 because  of a nonhealing wound. He lives in independent living. He walks with the aid of to a walker. He did have lower extremity peripheral Doppler studies that showed a right ABI of 0.85 and an occluded proximal right SFA. He does not have an open wound on the right side nor do I think he ambulate enough to experience lifestyle limiting claudication. At this point, we'll continue to follow him conservatively.      Lorretta Harp MD FACP,FACC,FAHA, [facility] 05/27/2017 12:08 PM

## 2017-06-04 ENCOUNTER — Encounter: Payer: Self-pay | Admitting: Gastroenterology

## 2017-06-10 ENCOUNTER — Other Ambulatory Visit (HOSPITAL_COMMUNITY): Payer: Self-pay | Admitting: *Deleted

## 2017-06-11 ENCOUNTER — Encounter (HOSPITAL_COMMUNITY)
Admission: RE | Admit: 2017-06-11 | Discharge: 2017-06-11 | Disposition: A | Discharge status: Home / Self Care | Payer: Medicare (Managed Care) | Source: Ambulatory Visit | Attending: Family Medicine | Admitting: Family Medicine

## 2017-06-11 DIAGNOSIS — D649 Anemia, unspecified: Secondary | ICD-10-CM | POA: Diagnosis present

## 2017-06-11 MED ORDER — SODIUM CHLORIDE 0.9 % IV SOLN
510.0000 mg | INTRAVENOUS | Status: DC
  Administered 2017-06-11: 510 mg via INTRAVENOUS
  Filled 2017-06-11: qty 17

## 2017-06-17 ENCOUNTER — Other Ambulatory Visit (HOSPITAL_COMMUNITY): Payer: Self-pay | Admitting: *Deleted

## 2017-06-18 ENCOUNTER — Encounter (HOSPITAL_COMMUNITY)
Admission: RE | Admit: 2017-06-18 | Discharge: 2017-06-18 | Disposition: A | Discharge status: Home / Self Care | Payer: Medicare (Managed Care) | Source: Ambulatory Visit | Attending: Family Medicine | Admitting: Family Medicine

## 2017-06-18 DIAGNOSIS — D649 Anemia, unspecified: Secondary | ICD-10-CM | POA: Diagnosis not present

## 2017-06-18 MED ORDER — SODIUM CHLORIDE 0.9 % IV SOLN
510.0000 mg | Freq: Once | INTRAVENOUS | Status: AC
  Administered 2017-06-18: 11:00:00 510 mg via INTRAVENOUS
  Filled 2017-06-18: qty 17

## 2017-07-22 ENCOUNTER — Telehealth: Payer: Self-pay | Admitting: Gastroenterology

## 2017-07-22 ENCOUNTER — Telehealth: Payer: Self-pay | Admitting: *Deleted

## 2017-07-22 ENCOUNTER — Ambulatory Visit (AMBULATORY_SURGERY_CENTER): Payer: Self-pay | Admitting: *Deleted

## 2017-07-22 VITALS — Ht 66.0 in | Wt 233.0 lb

## 2017-07-22 DIAGNOSIS — D508 Other iron deficiency anemias: Secondary | ICD-10-CM

## 2017-07-22 MED ORDER — NA SULFATE-K SULFATE-MG SULF 17.5-3.13-1.6 GM/177ML PO SOLN: ORAL | 0 refills | Status: DC

## 2017-07-22 NOTE — Progress Notes (Signed)
No allergies to eggs or soy. No problems with anesthesia.  Pt given Emmi instructions for colonoscopy  No oxygen use  No diet drug use  

## 2017-07-22 NOTE — Telephone Encounter (Signed)
Dr Havery Moros: I saw this pt in [place] today.  Several issues with this patient: he lives alone and says he does not have anyone to come with him the day of the procedure.  He is wheelchair bound but can stand with assistance and has multiple health issues. He is scheduled for direct colonoscopy for iron deficiency anemia.  Should pt be scheduled for office visit?  Thanks, Juliann Pulse in [facility]

## 2017-07-22 NOTE — Telephone Encounter (Signed)
Pt had PV today- Leslie Dash RN gave pt sample in PV today   Leslie [name] PV

## 2017-07-23 NOTE — Telephone Encounter (Signed)
He should be seen in the office for a visit for iron deficiency anemia. Direct colonoscopy is not appropriate for anemia, he may also need EGD. We can discuss how he wishes to proceed. Can you book him in first available with me or one of the PAs? Thanks

## 2017-07-27 NOTE — Telephone Encounter (Addendum)
Talked with pt; he says that all appointments need to be scheduled through [facility].  I will call today and schedule appointment.  Colonoscopy scheduled for 08/05/17 cancelled

## 2017-07-27 NOTE — Telephone Encounter (Signed)
Talked with Kennyth Lose from [facility].  Pt scheduled for new pt appt with Alonza Bogus on 07/31/17.

## 2017-07-31 ENCOUNTER — Ambulatory Visit: Payer: Medicare (Managed Care) | Admitting: Gastroenterology

## 2017-08-05 ENCOUNTER — Encounter: Payer: Medicare (Managed Care) | Admitting: Gastroenterology

## 2017-10-01 ENCOUNTER — Other Ambulatory Visit: Payer: Self-pay | Admitting: Family Medicine

## 2017-10-01 ENCOUNTER — Ambulatory Visit
Admission: RE | Admit: 2017-10-01 | Discharge: 2017-10-01 | Disposition: A | Discharge status: Home / Self Care | Payer: Medicare (Managed Care) | Source: Ambulatory Visit | Attending: Family Medicine | Admitting: Family Medicine

## 2017-10-01 DIAGNOSIS — R059 Cough, unspecified: Secondary | ICD-10-CM

## 2017-10-01 DIAGNOSIS — R05 Cough: Secondary | ICD-10-CM

## 2018-01-08 ENCOUNTER — Telehealth: Payer: Self-pay | Admitting: Cardiovascular Disease

## 2018-01-08 NOTE — Telephone Encounter (Signed)
Left message to call back  

## 2018-01-08 NOTE — Telephone Encounter (Signed)
New Message  [name] says she is calling to schedule a revascularization of the right leg. Please call

## 2018-01-08 NOTE — Telephone Encounter (Signed)
Follow up    [place] with [facility] returning your call

## 2018-01-08 NOTE — Telephone Encounter (Signed)
Returned call to [facility], she is requesting patient be seen by Dr. Gwenlyn Found next week due to wound on right toe.      Patient scheduled with Dr. Gwenlyn Found 3/20 at 1:45 pm. Davy Pique is aware and verbalized understanding.

## 2018-01-13 ENCOUNTER — Encounter: Payer: Self-pay | Admitting: Cardiovascular Disease

## 2018-01-13 ENCOUNTER — Ambulatory Visit (INDEPENDENT_AMBULATORY_CARE_PROVIDER_SITE_OTHER): Payer: Medicare (Managed Care) | Admitting: Cardiovascular Disease

## 2018-01-13 ENCOUNTER — Telehealth: Payer: Self-pay | Admitting: Cardiovascular Disease

## 2018-01-13 DIAGNOSIS — E7800 Pure hypercholesterolemia, unspecified: Secondary | ICD-10-CM

## 2018-01-13 DIAGNOSIS — I10 Essential (primary) hypertension: Secondary | ICD-10-CM | POA: Diagnosis not present

## 2018-01-13 DIAGNOSIS — I739 Peripheral vascular disease, unspecified: Secondary | ICD-10-CM | POA: Diagnosis not present

## 2018-01-13 NOTE — Assessment & Plan Note (Signed)
History of essential hypertension blood pressure measured 162/72. He is on amlodipine hydralazine, metoprolol, and losartan. Continue current meds at current dosing.

## 2018-01-13 NOTE — Assessment & Plan Note (Signed)
History of hyperlipidemia on Repatha followed by his PCP. °

## 2018-01-13 NOTE — Patient Instructions (Signed)

## 2018-01-13 NOTE — Assessment & Plan Note (Signed)
History of left BKA by Dr. Sharol Given on  04/11/16 for critical ischemia. His most recent Dopplers performed 04/27/17 revealed a right ABI 0.85 with what appears to be an occluded right SFA. He is essentially wheelchair bound. He has chronic right lower extremity edema which is probably dependent

## 2018-01-13 NOTE — Progress Notes (Signed)
01/13/2018 Leslie [name]   04-07-1946  [id number]  Primary Physician [name], MD Primary Cardiologist: Lorretta Harp MD FACP, [place], [place], [place]  HPI:  Leslie [name] is a 72 y.o.  moderately overweight widowed Caucasian male with no children who lives alone in an independent living environment and was referred by Dr. Jake Bathe for peripheral vascular evaluation. 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 He has 50-80 pack years history of tobacco use having quit 13 years ago. He also has treated diabetes, hypertension and hyperlipidemia. He has peripheral neuropathy. He underwent lower lower extremity angiography by Dr. Burt Knack 04/18/13 revealing noncritical PAD with predominantly tibial vessel disease. On the left the posterior tibial was occluded which reconstituted above the ankle and the anterior tibial and pedal arteries were patent with diffuse disease. On the right the anterior tibial had a 75% ostial stenosis with three-vessel disease. He denies claudication. He has a slowly healing wound on the lateral aspect of his right fifth metatarsal which is being addressed at the [facility]. He had recent ABIs performed on 03/27/15 which revealed a right ABI of 0.8 and left 0.78 although these were probably artificially elevated because of calcified noncompressible vessels. I'm going to repeat lower extremity arterial Doppler studies. Since I saw him 2 months ago he developed recurrent wound on his left foot which is currently wrapped. Follow-up Dopplers performed in the office 05/11/15 revealed a right ABI 0.81 with high-frequency signal in his proximal right SFA and popliteal artery, left ABI 0.88 with a hypertensive signal in his mid left SFA. Since I saw him in the office 2 years ago he has undergone left BKA by Dr. Sharol Given  04/11/16 for what appears to be a nonhealing ulcer/critical limb ischemia. He wears a  prosthesis and walks with the aid of a walker. Recent lower extremity arterial Doppler studies performed 04/27/17 revealed a right ABI of 0.85 with an occluded proximal right SFA.  Since I saw him 8 months ago he's remained stable. He is wheelchair-bound and the most part not ambulatory. He denies chest pain or shortness of breath.     Current Meds  Medication Sig  . albuterol (ACCUNEB) 1.25 MG/3ML nebulizer solution Take 1 ampule by nebulization every 6 (six) hours as needed for wheezing.  Marland Kitchen albuterol (PROVENTIL HFA;VENTOLIN HFA) 108 (90 Base) MCG/ACT inhaler Inhale 2 puffs into the lungs every 4 (four) hours as needed for shortness of breath.  . allopurinol (ZYLOPRIM) 300 MG tablet take 1 tablet by mouth once daily  . amLODipine (NORVASC) 10 MG tablet Take 1 tablet (10 mg total) by mouth daily.  Marland Kitchen aspirin 81 MG tablet Take 81 mg by mouth daily.  . carbamazepine (TEGRETOL) 200 MG tablet Take 200-300 mg by mouth 2 (two) times daily. Take one tablet (200mg ) in the morning and one and a half tablets (300mg ) in the evening.  . cholecalciferol (VITAMIN D) 1000 UNITS tablet Take 1,000 Units by mouth daily.  . clonazePAM (KLONOPIN) 1 MG tablet Take 1 mg by mouth daily.  Marland Kitchen doxycycline (DORYX) 100 MG EC tablet Take 100 mg by mouth as directed.  . DULoxetine (CYMBALTA) 60 MG capsule Take 60 mg by mouth daily.  . Evolocumab (REPATHA SURECLICK) 329 MG/ML SOAJ Inject into the skin.  . furosemide (LASIX) 20 MG tablet Take 20 mg by mouth daily.  Marland Kitchen glimepiride (AMARYL) 4  MG tablet Take 4 mg by mouth daily with breakfast.  . hydrALAZINE (APRESOLINE) 50 MG tablet Take 50 mg by mouth daily.  . insulin glargine (LANTUS) 100 UNIT/ML injection Inject 15 Units into the skin at bedtime.  Marland Kitchen losartan (COZAAR) 100 MG tablet Take 100 mg by mouth daily.  . metFORMIN (GLUCOPHAGE) 1000 MG tablet Take 1 tablet (1,000 mg total) by mouth 2 (two) times daily with a meal.  . metoprolol (LOPRESSOR) 100 MG tablet Take 2 tablets  (200 mg total) by mouth 2 (two) times daily.  . mirtazapine (REMERON) 7.5 MG tablet Take 7.5 mg by mouth at bedtime.  . nitroGLYCERIN (NITROSTAT) 0.4 MG SL tablet Place 0.4 mg under the tongue every 5 (five) minutes as needed for chest pain.   . pregabalin (LYRICA) 150 MG capsule Take 150 mg by mouth 2 (two) times daily.  . ranitidine (ZANTAC) 300 MG capsule Take 300 mg by mouth every evening.  . senna (SENOKOT) 8.6 MG tablet Take 2 tablets by mouth at bedtime.      Allergies  Allergen Reactions  . Flexeril [Cyclobenzaprine] Other (See Comments)    Dry mouth and hallucinations.     Social History   Socioeconomic History  . Marital status: Widowed    Spouse name: Not on file  . Number of children: Not on file  . Years of education: Not on file  . Highest education level: Not on file  Social Needs  . Financial resource strain: Not on file  . Food insecurity - worry: Not on file  . Food insecurity - inability: Not on file  . Transportation needs - medical: Not on file  . Transportation needs - non-medical: Not on file  Occupational History  . Occupation: disabled  Tobacco Use  . Smoking status: Former Smoker    Packs/day: 2.00    Years: 40.00    Pack years: 80.00    Types: Cigarettes    Last attempt to quit: 01/13/2002    Years since quitting: 16.0  . Smokeless tobacco: Current User    Types: Chew  Substance and Sexual Activity  . Alcohol use: Yes    Alcohol/week: 0.6 oz    Types: 1 Cans of beer per week  . Drug use: No    Comment: 03/27/2016 "last marijuana was maybe in 2013"  . Sexual activity: No  Other Topics Concern  . 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   Lives in assisted living. Performs ADLs and IADLs, including cooking and managing his medications. Friend drives him to the store. Uses rollator walker at home and motorized scooter in store.   [facility] patient   Widowed   5-8  years education   Likes to fish   Pets:  4 cockatiels and 2 lovebirds     Review of Systems: General: negative for chills, fever, night sweats or weight changes.  Cardiovascular: negative for chest pain, dyspnea on exertion, edema, orthopnea, palpitations, paroxysmal nocturnal dyspnea or shortness of breath Dermatological: negative for rash Respiratory: negative for cough or wheezing Urologic: negative for hematuria Abdominal: negative for nausea, vomiting, diarrhea, bright red blood per rectum, melena, or hematemesis Neurologic: negative for visual changes, syncope, or dizziness All other systems reviewed and are otherwise negative except as noted above.    Blood pressure (!) 162/72, pulse 77, height 5\' 6"  (1.676 m), weight 220 lb (99.8 kg).  General appearance: alert and no distress Neck: no  adenopathy, no carotid bruit, no JVD, supple, symmetrical, trachea midline and thyroid not enlarged, symmetric, no tenderness/mass/nodules Lungs: clear to auscultation bilaterally Heart: regular rate and rhythm, S1, S2 normal, no murmur, click, rub or gallop Extremities: left BKA wearing a prosthesis, 2-3+ pitting edema on the right Pulses: left BKA, absent right pedal pulses Skin: Skin color, texture, turgor normal. No rashes or lesions Neurologic: Alert and oriented X 3, normal strength and tone. Normal symmetric reflexes. Normal coordination and gait  EKG sinus rhythm at 77 without ST or T-wave changes. I personally reviewed this EKG.  ASSESSMENT AND PLAN:   HLD (hyperlipidemia) History of hyperlipidemia on Repatha followed by his PCP  HTN (hypertension) History of essential hypertension blood pressure measured 162/72. He is on amlodipine hydralazine, metoprolol, and losartan. Continue current meds at current dosing.  Peripheral arterial disease ([place]) History of left BKA by Dr. Sharol Given on  04/11/16 for critical ischemia. His most recent Dopplers performed 04/27/17 revealed a right ABI 0.85  with what appears to be an occluded right SFA. He is essentially wheelchair bound. He has chronic right lower extremity edema which is probably dependent      Lorretta Harp MD [facility], [facility] 01/13/2018 2:23 PM

## 2018-01-14 ENCOUNTER — Ambulatory Visit (INDEPENDENT_AMBULATORY_CARE_PROVIDER_SITE_OTHER): Payer: Medicare (Managed Care)

## 2018-01-14 ENCOUNTER — Ambulatory Visit (INDEPENDENT_AMBULATORY_CARE_PROVIDER_SITE_OTHER): Payer: Medicare (Managed Care) | Admitting: Orthopedic Surgery

## 2018-01-14 ENCOUNTER — Encounter (INDEPENDENT_AMBULATORY_CARE_PROVIDER_SITE_OTHER): Payer: Self-pay | Admitting: Orthopedic Surgery

## 2018-01-14 VITALS — Ht 64.0 in | Wt 232.0 lb

## 2018-01-14 DIAGNOSIS — M79671 Pain in right foot: Secondary | ICD-10-CM

## 2018-01-14 DIAGNOSIS — I87331 Chronic venous hypertension (idiopathic) with ulcer and inflammation of right lower extremity: Secondary | ICD-10-CM | POA: Diagnosis not present

## 2018-01-14 DIAGNOSIS — I70261 Atherosclerosis of native arteries of extremities with gangrene, right leg: Secondary | ICD-10-CM | POA: Insufficient documentation

## 2018-01-14 DIAGNOSIS — L97919 Non-pressure chronic ulcer of unspecified part of right lower leg with unspecified severity: Secondary | ICD-10-CM | POA: Insufficient documentation

## 2018-01-14 NOTE — Progress Notes (Signed)
Office Visit Note   Patient: Leslie [name]           Date of Birth: 09-20-1946           MRN: [id number] Visit Date: 01/14/2018              Requested by: [name], MD [address] [place], [place] [address] PCP: [name], MD  Chief Complaint  Patient presents with  . Right Foot - Open Wound, Pain    3rd toe right foot is dark, with yellowish drainage x 3 weeks      HPI: Patient is a 72 year old gentleman with peripheral vascular disease who was seen for dry gangrene right foot third toe.  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  Patient's leg is dependent and has chronic venous insufficiency he states that his leg is much smaller than it normally is.  Patient has recently been seen in the office with Dr. Donnella Bi and I have reconsulted Dr. Alvester Chou regarding circulation in the right lower extremity.  Assessment & Plan: Visit Diagnoses:  1. Pain in right foot   2. 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 Alvester Chou we will have patient follow-up with Dr. Alvester Chou for angiogram of the right lower extremity for possibility of stenting or angioplasty to try to improve the circulation.  Discussed with the patient if we can improve his circulation a little we should be able to proceed with a third ray amputation and not have to amputate his foot.  Patient was given a [facility] to [facility] to get a 15-20 mm compression stockings knee-high to be worn on the right lower extremity to help with the venous insufficiency this should help with the arterial microcirculation.  Patient will continue with his doxycycline.  Follow-Up Instructions: Return in about 2 weeks (around 01/28/2018).   Ortho Exam  Patient is alert, oriented, no adenopathy, well-dressed, normal affect, normal respiratory effort. On examination patient has weeping edema in the right leg  with venous insufficiency with dermatitis.  He has a venous ulcer posteriorly over the calf but no infection.  He does not have palpable pulses but he does have a dopplerable dorsalis pedis pulse.  Looking at his records he does have an ABI of 0.85.  Imaging: Xr Foot Complete Right  Result Date: 01/14/2018 3 view radiographs of the right foot shows no destructive bony changes of the third toe.  Patient does have calcification of arteries to the MTP joints.  There is swelling of the third toe.  No air in the soft tissue.  No images are attached to the encounter.  Labs: Lab Results  Component Value Date   HGBA1C 7.2 (H) 03/27/2016   HGBA1C 6.9 11/19/2012   HGBA1C 6.0 08/09/2012   ESRSEDRATE 90 (H) 03/27/2016   CRP 23.4 (H) 03/27/2016   LABURIC 4.8 08/09/2012   REPTSTATUS 05/08/2011 FINAL 05/01/2011   CULT NO GROWTH 5 DAYS 05/01/2011    @LABSALLVALUES (HGBA1)@  Body mass index is 39.82 kg/m.  Orders:  Orders Placed This Encounter  Procedures  . XR Foot Complete Right   No orders of the defined types were placed in this encounter.    Procedures: No procedures performed  Clinical Data: No additional findings.  ROS:  All other systems negative, except as noted in the HPI. Review of Systems  Objective: Vital Signs: Ht  5\' 4"  (1.626 m)   Wt 232 lb (105.2 kg)   BMI 39.82 kg/m   Specialty Comments:  No specialty comments available.  PMFS History: Patient Active Problem List   Diagnosis Date Noted  . 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 Below knee amputation status ([place]) 04/11/2016  . History of tobacco abuse 03/31/2016  . Osteomyelitis of left foot ([place])   . 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 Septic arthritis of left foot ([place]) 03/27/2016  . Solitary pulmonary nodule 08/14/2015  . Precordial chest pain 08/13/2015  . Peripheral arterial disease ([place]) 04/18/2015  . 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 Right  anterior knee pain 11/19/2012  . Toenail deformity 01/16/2012  . Gout   . Diabetic neuropathy ([place]) 03/31/2008  . ALCOHOLISM 03/31/2008  . Rosacea 03/31/2008  . GERD 01/31/2008  . COPD (chronic obstructive pulmonary disease) ([place]) 08/03/2007  . DM W/COMPLICATION NOS, TYPE II 05/08/2007  . OBESITY 05/08/2007  . HTN (hypertension) 05/08/2007  . 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 HISTORY OF ASBESTOS EXPOSURE 05/08/2007  . Chronic lower back pain on narcotics  06/16/2005  . HLD (hyperlipidemia) 03/22/2002   Past Medical History:  Diagnosis Date  . Anxiety    Panic attack - 10  years aago- ? panic   . Asthma   . Cellulitis of left foot 2016   Healed and recurred 01/2016  . Cellulitis of right foot 06/30/2012  . Chronic lower back pain 07/01/2012  . COPD (chronic obstructive pulmonary disease) (H[facility])    Uses nebulizers at home. Former smoker, current chewing tobacco. 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  Marland Kitchen GERD (gastroesophageal reflux disease)   . Gouty arthritis 07/01/2012   Not attacks in ~5 yrs (03/2016)  . Hepatitis 1967   "in jail when I got it; dr said it was pretty bad; quaranteened X 30d"  . Hyperlipidemia   . Hypertension   . Lung nodule, solitary 07/2015   Due for 1-yr recheck in 07/2017  . Peripheral arterial disease ([place])    critical limb ischemia  . Peripheral neuropathy 07/01/2012  . Seasonal allergies   . Shortness of breath dyspnea   . Skin growth 07/01/2012   right side of nares; "been on there 20 years"  . Substance abuse ([place])   . Type II diabetes mellitus ([place]) 2002   Diagnosed in 2002    Family History  Problem Relation Age of Onset  . Diabetes Sister   . Hypertension Mother     Past Surgical History:  Procedure Laterality Date  . AMPUTATION Left 03/28/2016   Procedure: AMPUTATION left foot 4th and 5 th ray;  Surgeon: Newt Minion, MD;  Location: [facility];  Service: Orthopedics;  Laterality: Left;  . AMPUTATION Left 04/11/2016   Procedure: LEFT BELOW KNEE AMPUTATION;  Surgeon: Newt Minion, MD;  Location: [facility];  Service: Orthopedics;  Laterality: Left;  . LEG AMPUTATION BELOW KNEE Left 04/11/2016  . LOWER EXTREMITY ANGIOGRAM N/A 04/18/2013   Procedure: LOWER EXTREMITY ANGIOGRAM;  Surgeon: Sherren Mocha, MD;  [facility] CATH LAB;  Service: Cardiovascular;  Laterality: N/A;  . LOWER EXTREMITY ANGIOGRAM  08/13/2015   bilateral iliac   . PERIPHERAL VASCULAR CATHETERIZATION Bilateral 08/13/2015   Procedure: Lower Extremity Angiography;  Surgeon: Lorretta Harp, MD;  Location: [facility] CV LAB;  Service: Cardiovascular;  Laterality: Bilateral;  . PERIPHERAL VASCULAR CATHETERIZATION N/A 08/13/2015   Procedure: Abdominal Aortogram;  Surgeon: Lorretta Harp, MD;  Location: [facility] CV LAB;  Service: Cardiovascular;  Laterality: N/A;   Social History   Occupational History  . Occupation: disabled  Tobacco Use  . Smoking status: Former Smoker    Packs/day: 2.00    Years: 40.00    Pack years: 80.00    Types: Cigarettes    Last attempt to quit: 01/13/2002    Years since quitting: 16.0  . Smokeless tobacco: Current User    Types: Chew  Substance and Sexual Activity  . Alcohol use: Yes    Alcohol/week: 0.6 oz    Types: 1 Cans of beer per week  . Drug use: No    Types: Marijuana    Comment: 03/27/2016 "last marijuana was maybe in 2013"  . Sexual activity: Never

## 2018-01-15 ENCOUNTER — Encounter: Payer: Self-pay | Admitting: Cardiovascular Disease

## 2018-01-15 ENCOUNTER — Ambulatory Visit (INDEPENDENT_AMBULATORY_CARE_PROVIDER_SITE_OTHER): Payer: Medicare (Managed Care) | Admitting: Cardiovascular Disease

## 2018-01-15 ENCOUNTER — Telehealth (INDEPENDENT_AMBULATORY_CARE_PROVIDER_SITE_OTHER): Payer: Self-pay

## 2018-01-15 VITALS — BP 122/62 | HR 79 | Ht 64.0 in | Wt 233.0 lb

## 2018-01-15 DIAGNOSIS — I998 Other disorder of circulatory system: Secondary | ICD-10-CM | POA: Diagnosis not present

## 2018-01-15 DIAGNOSIS — I70261 Atherosclerosis of native arteries of extremities with gangrene, right leg: Secondary | ICD-10-CM | POA: Diagnosis not present

## 2018-01-15 DIAGNOSIS — I739 Peripheral vascular disease, unspecified: Secondary | ICD-10-CM

## 2018-01-15 DIAGNOSIS — I70229 Atherosclerosis of native arteries of extremities with rest pain, unspecified extremity: Secondary | ICD-10-CM

## 2018-01-15 NOTE — Patient Instructions (Signed)

## 2018-01-15 NOTE — Telephone Encounter (Signed)
Dr. Sharol Given spoke with Dr. Gwenlyn Found about the pt and his circulation right foot. The pt is occluded and there is no revacs surgery that would help the pt. Per Dr. Sharol Given will add on for surgery next Friday for right foot 3rd ray amputation. Will call pt to advise surgery details.

## 2018-01-15 NOTE — Assessment & Plan Note (Signed)
Mr. Luhmann returns today for further evaluation of his right third toe which appears gangrenous. He did see Dr. Sharol Given  yesterday for potential amputation. I have reviewed the angiogram which I performed on him 08/13/15 revealing high-grade diffusely calcified right SFA from the origin down to the adductor canal with three-vessel runoff. Recent Dopplers performed 04/27/17 revealed a right ABI 0.85 with an occluded right SFA. I do not think he has percutaneous options for revascularization nor do I think he is a candidate for femoropopliteal bypass grafting. I have spoken with Dr. Sharol Given  and I recommended toe amputation with aggressive wound care.

## 2018-01-15 NOTE — Progress Notes (Signed)
Mr. Karman returns today for further evaluation of his right third toe which appears gangrenous. He did see Dr. Sharol Given  yesterday for potential amputation. I have reviewed the angiogram which I performed on him 08/13/15 revealing high-grade diffusely calcified right SFA from the origin down to the adductor canal with three-vessel runoff. Recent Dopplers performed 04/27/17 revealed a right ABI 0.85 with an occluded right SFA. I do not think he has percutaneous options for revascularization nor do I think he is a candidate for femoropopliteal bypass grafting. I have spoken with Dr. Sharol Given  and I recommended toe amputation with aggressive wound care  Lorretta Harp, M.D., [place], [facility], [place] [place]. [place], [place]  [id number]  [phone] 01/15/2018 11:53 AM

## 2018-01-15 NOTE — Addendum Note (Signed)
Addended by: Zebedee Iba on: 01/15/2018 08:46 AM   Modules accepted: Orders

## 2018-01-15 NOTE — Telephone Encounter (Signed)
Coming to see you about this

## 2018-01-19 ENCOUNTER — Ambulatory Visit: Payer: Medicare (Managed Care) | Admitting: Cardiovascular Disease

## 2018-01-20 ENCOUNTER — Other Ambulatory Visit (INDEPENDENT_AMBULATORY_CARE_PROVIDER_SITE_OTHER): Payer: Self-pay | Admitting: Orthopedic Surgery

## 2018-01-20 DIAGNOSIS — I96 Gangrene, not elsewhere classified: Secondary | ICD-10-CM

## 2018-01-21 ENCOUNTER — Other Ambulatory Visit: Payer: Self-pay

## 2018-01-21 ENCOUNTER — Encounter (HOSPITAL_COMMUNITY): Payer: Self-pay | Admitting: *Deleted

## 2018-01-21 NOTE — Progress Notes (Signed)
Mr Norkus reports that his CBG runs "good", patient has a blood sugar reader - patient said he can not read it.  I instructed patient to take 7 units of Lanuts Insulin tonight.  Patient reports that all of his medications are in AM/PM package- I instructed patient to not take any medication in the am.  I called [facility] and asked for his last office visit and Labs.  I told patient that if he feels like his CBG is low to drink 1/2  Cup of apple or cranberry juice, patient said he does not have any juice. I told him if he feels like it is dropping low to call -pre- op des

## 2018-01-22 ENCOUNTER — Other Ambulatory Visit: Payer: Self-pay

## 2018-01-22 ENCOUNTER — Encounter (HOSPITAL_COMMUNITY)
Admission: RE | Disposition: A | Discharge status: Home / Self Care | Payer: Self-pay | Source: Ambulatory Visit | Attending: Orthopedic Surgery

## 2018-01-22 ENCOUNTER — Ambulatory Visit (HOSPITAL_COMMUNITY): Payer: Medicare (Managed Care) | Admitting: Anesthesiology

## 2018-01-22 ENCOUNTER — Encounter (HOSPITAL_COMMUNITY): Payer: Self-pay | Admitting: *Deleted

## 2018-01-22 ENCOUNTER — Ambulatory Visit (HOSPITAL_COMMUNITY)
Admission: RE | Admit: 2018-01-22 | Discharge: 2018-01-23 | Disposition: A | Discharge status: Home / Self Care | Payer: Medicare (Managed Care) | Source: Ambulatory Visit | Attending: Orthopedic Surgery | Admitting: Orthopedic Surgery

## 2018-01-22 DIAGNOSIS — Z89512 Acquired absence of left leg below knee: Secondary | ICD-10-CM | POA: Insufficient documentation

## 2018-01-22 DIAGNOSIS — F17220 Nicotine dependence, chewing tobacco, uncomplicated: Secondary | ICD-10-CM | POA: Insufficient documentation

## 2018-01-22 DIAGNOSIS — M109 Gout, unspecified: Secondary | ICD-10-CM | POA: Diagnosis not present

## 2018-01-22 DIAGNOSIS — J449 Chronic obstructive pulmonary disease, unspecified: Secondary | ICD-10-CM | POA: Diagnosis not present

## 2018-01-22 DIAGNOSIS — E1169 Type 2 diabetes mellitus with other specified complication: Secondary | ICD-10-CM | POA: Diagnosis not present

## 2018-01-22 DIAGNOSIS — I96 Gangrene, not elsewhere classified: Secondary | ICD-10-CM

## 2018-01-22 DIAGNOSIS — Z888 Allergy status to other drugs, medicaments and biological substances status: Secondary | ICD-10-CM | POA: Insufficient documentation

## 2018-01-22 DIAGNOSIS — Z794 Long term (current) use of insulin: Secondary | ICD-10-CM | POA: Diagnosis not present

## 2018-01-22 DIAGNOSIS — E785 Hyperlipidemia, unspecified: Secondary | ICD-10-CM | POA: Insufficient documentation

## 2018-01-22 DIAGNOSIS — E1152 Type 2 diabetes mellitus with diabetic peripheral angiopathy with gangrene: Secondary | ICD-10-CM | POA: Diagnosis not present

## 2018-01-22 DIAGNOSIS — I10 Essential (primary) hypertension: Secondary | ICD-10-CM | POA: Insufficient documentation

## 2018-01-22 DIAGNOSIS — Z7982 Long term (current) use of aspirin: Secondary | ICD-10-CM | POA: Diagnosis not present

## 2018-01-22 DIAGNOSIS — I872 Venous insufficiency (chronic) (peripheral): Secondary | ICD-10-CM | POA: Insufficient documentation

## 2018-01-22 DIAGNOSIS — S98131A Complete traumatic amputation of one right lesser toe, initial encounter: Secondary | ICD-10-CM

## 2018-01-22 DIAGNOSIS — E1142 Type 2 diabetes mellitus with diabetic polyneuropathy: Secondary | ICD-10-CM | POA: Diagnosis not present

## 2018-01-22 DIAGNOSIS — Z79899 Other long term (current) drug therapy: Secondary | ICD-10-CM | POA: Diagnosis not present

## 2018-01-22 HISTORY — PX: TOE AMPUTATION: SHX809

## 2018-01-22 HISTORY — PX: AMPUTATION: SHX166

## 2018-01-22 LAB — GLUCOSE, CAPILLARY
GLUCOSE-CAPILLARY: 148 mg/dL — AB (ref 65–99)
Glucose-Capillary: 136 mg/dL — ABNORMAL HIGH (ref 65–99)
Glucose-Capillary: 133 mg/dL — ABNORMAL HIGH (ref 65–99)
Glucose-Capillary: 243 mg/dL — ABNORMAL HIGH (ref 65–99)

## 2018-01-22 LAB — COMPREHENSIVE METABOLIC PANEL
ALBUMIN: 2.4 g/dL — AB (ref 3.5–5.0)
ALT: 34 U/L (ref 17–63)
AST: 35 U/L (ref 15–41)
Alkaline Phosphatase: 95 U/L (ref 38–126)
Anion gap: 14 (ref 5–15)
BILIRUBIN TOTAL: 0.7 mg/dL (ref 0.3–1.2)
BUN: 10 mg/dL (ref 6–20)
CALCIUM: 8.5 mg/dL — AB (ref 8.9–10.3)
CO2: 23 mmol/L (ref 22–32)
CREATININE: 0.98 mg/dL (ref 0.61–1.24)
Chloride: 94 mmol/L — ABNORMAL LOW (ref 101–111)
GFR calc Af Amer: >60 mL/min (ref >60)
GFR calc non Af Amer: >60 mL/min (ref >60)
Glucose, Bld: 134 mg/dL — ABNORMAL HIGH (ref 65–99)
Potassium: 3.9 mmol/L (ref 3.5–5.1)
SODIUM: 131 mmol/L — AB (ref 135–145)
TOTAL PROTEIN: 7.4 g/dL (ref 6.5–8.1)

## 2018-01-22 LAB — CBC
HEMATOCRIT: 28.9 % — AB (ref 39.0–52.0)
HEMOGLOBIN: 9.6 g/dL — AB (ref 13.0–17.0)
MCH: 29.3 pg (ref 26.0–34.0)
MCHC: 33.2 g/dL (ref 30.0–36.0)
MCV: 88.1 fL (ref 78.0–100.0)
Platelets: 278 10*3/uL (ref 150–400)
RBC: 3.28 MIL/uL — AB (ref 4.22–5.81)
RDW: 13.4 % (ref 11.5–15.5)
WBC: 11.6 10*3/uL — ABNORMAL HIGH (ref 4.0–10.5)

## 2018-01-22 SURGERY — AMPUTATION, FOOT, RAY
Anesthesia: General | Site: Foot | Laterality: Right

## 2018-01-22 MED ORDER — FUROSEMIDE 20 MG PO TABS
20.0000 mg | ORAL_TABLET | Freq: Every day | ORAL | Status: DC
  Administered 2018-01-22 – 2018-01-23 (×2): 20 mg via ORAL
  Filled 2018-01-22 (×2): qty 1

## 2018-01-22 MED ORDER — METFORMIN HCL 500 MG PO TABS
1000.0000 mg | ORAL_TABLET | Freq: Two times a day (BID) | ORAL | Status: DC
  Administered 2018-01-22 – 2018-01-23 (×2): 1000 mg via ORAL
  Filled 2018-01-22 (×2): qty 2

## 2018-01-22 MED ORDER — METHOCARBAMOL 1000 MG/10ML IJ SOLN
500.0000 mg | Freq: Four times a day (QID) | INTRAVENOUS | Status: DC | PRN
  Filled 2018-01-22: qty 5

## 2018-01-22 MED ORDER — OXYCODONE HCL 5 MG PO TABS: 5.0000 mg | ORAL_TABLET | Freq: Once | ORAL | Status: DC | PRN

## 2018-01-22 MED ORDER — DOCUSATE SODIUM 100 MG PO CAPS
100.0000 mg | ORAL_CAPSULE | Freq: Two times a day (BID) | ORAL | Status: DC
  Administered 2018-01-22: 100 mg via ORAL
  Filled 2018-01-22 (×2): qty 1

## 2018-01-22 MED ORDER — MORPHINE SULFATE (PF) 4 MG/ML IV SOLN: 0.5000 mg | INTRAVENOUS | Status: DC | PRN

## 2018-01-22 MED ORDER — ROPIVACAINE HCL 5 MG/ML IJ SOLN
INTRAMUSCULAR | Status: DC | PRN
  Administered 2018-01-22: 30 mL via PERINEURAL

## 2018-01-22 MED ORDER — DULOXETINE HCL 60 MG PO CPEP
60.0000 mg | ORAL_CAPSULE | Freq: Every day | ORAL | Status: DC
  Administered 2018-01-22 – 2018-01-23 (×2): 60 mg via ORAL
  Filled 2018-01-22 (×2): qty 1

## 2018-01-22 MED ORDER — SODIUM CHLORIDE 0.9 % IV SOLN
INTRAVENOUS | Status: DC
  Administered 2018-01-22: 16:00:00 via INTRAVENOUS

## 2018-01-22 MED ORDER — LOSARTAN POTASSIUM 50 MG PO TABS
100.0000 mg | ORAL_TABLET | Freq: Every day | ORAL | Status: DC
  Administered 2018-01-22 – 2018-01-23 (×2): 100 mg via ORAL
  Filled 2018-01-22 (×2): qty 2

## 2018-01-22 MED ORDER — MIRTAZAPINE 7.5 MG PO TABS
7.5000 mg | ORAL_TABLET | Freq: Every day | ORAL | Status: DC
  Administered 2018-01-22: 7.5 mg via ORAL
  Filled 2018-01-22: qty 1

## 2018-01-22 MED ORDER — METOCLOPRAMIDE HCL 5 MG PO TABS: 5.0000 mg | ORAL_TABLET | Freq: Three times a day (TID) | ORAL | Status: DC | PRN

## 2018-01-22 MED ORDER — MAGNESIUM CITRATE PO SOLN: 1.0000 | Freq: Once | ORAL | Status: DC | PRN

## 2018-01-22 MED ORDER — MIDAZOLAM HCL 2 MG/2ML IJ SOLN
1.0000 mg | INTRAMUSCULAR | Status: DC | PRN
  Administered 2018-01-22: 1 mg via INTRAVENOUS

## 2018-01-22 MED ORDER — METHOCARBAMOL 500 MG PO TABS: 500.0000 mg | ORAL_TABLET | Freq: Four times a day (QID) | ORAL | Status: DC | PRN

## 2018-01-22 MED ORDER — NITROGLYCERIN 0.4 MG SL SUBL: 0.4000 mg | SUBLINGUAL_TABLET | SUBLINGUAL | Status: DC | PRN

## 2018-01-22 MED ORDER — METOCLOPRAMIDE HCL 5 MG/ML IJ SOLN: 5.0000 mg | Freq: Three times a day (TID) | INTRAMUSCULAR | Status: DC | PRN

## 2018-01-22 MED ORDER — LACTATED RINGERS IV SOLN
INTRAVENOUS | Status: DC
  Administered 2018-01-22: 08:00:00 via INTRAVENOUS

## 2018-01-22 MED ORDER — 0.9 % SODIUM CHLORIDE (POUR BTL) OPTIME
TOPICAL | Status: DC | PRN
  Administered 2018-01-22: 1000 mL

## 2018-01-22 MED ORDER — CEFAZOLIN SODIUM-DEXTROSE 1-4 GM/50ML-% IV SOLN
1.0000 g | Freq: Four times a day (QID) | INTRAVENOUS | Status: AC
  Administered 2018-01-22 – 2018-01-23 (×3): 1 g via INTRAVENOUS
  Filled 2018-01-22 (×3): qty 50

## 2018-01-22 MED ORDER — CLONAZEPAM 1 MG PO TABS
1.0000 mg | ORAL_TABLET | Freq: Every day | ORAL | Status: DC
  Administered 2018-01-22 – 2018-01-23 (×2): 1 mg via ORAL
  Filled 2018-01-22 (×2): qty 1

## 2018-01-22 MED ORDER — INSULIN GLARGINE 100 UNIT/ML ~~LOC~~ SOLN
15.0000 [IU] | Freq: Every day | SUBCUTANEOUS | Status: DC
  Administered 2018-01-22: 15 [IU] via SUBCUTANEOUS
  Filled 2018-01-22: qty 0.15

## 2018-01-22 MED ORDER — INSULIN ASPART 100 UNIT/ML ~~LOC~~ SOLN
0.0000 [IU] | Freq: Three times a day (TID) | SUBCUTANEOUS | Status: DC
  Administered 2018-01-22: 5 [IU] via SUBCUTANEOUS
  Administered 2018-01-23: 2 [IU] via SUBCUTANEOUS

## 2018-01-22 MED ORDER — ONDANSETRON HCL 4 MG/2ML IJ SOLN
INTRAMUSCULAR | Status: DC | PRN
  Administered 2018-01-22: 4 mg via INTRAVENOUS

## 2018-01-22 MED ORDER — METOPROLOL TARTRATE 100 MG PO TABS
200.0000 mg | ORAL_TABLET | ORAL | Status: AC
  Administered 2018-01-22: 200 mg via ORAL
  Filled 2018-01-22: qty 4

## 2018-01-22 MED ORDER — PROPOFOL 500 MG/50ML IV EMUL
INTRAVENOUS | Status: DC | PRN
  Administered 2018-01-22: 100 ug/kg/min via INTRAVENOUS

## 2018-01-22 MED ORDER — LIDOCAINE HCL (CARDIAC) 20 MG/ML IV SOLN
INTRAVENOUS | Status: AC
  Filled 2018-01-22: qty 15

## 2018-01-22 MED ORDER — ONDANSETRON HCL 4 MG/2ML IJ SOLN
INTRAMUSCULAR | Status: AC
  Filled 2018-01-22: qty 4

## 2018-01-22 MED ORDER — HYDROMORPHONE HCL 1 MG/ML IJ SOLN: 0.2500 mg | INTRAMUSCULAR | Status: DC | PRN

## 2018-01-22 MED ORDER — CHLORHEXIDINE GLUCONATE 4 % EX LIQD: 60.0000 mL | Freq: Once | CUTANEOUS | Status: DC

## 2018-01-22 MED ORDER — PROMETHAZINE HCL 25 MG/ML IJ SOLN: 6.2500 mg | INTRAMUSCULAR | Status: DC | PRN

## 2018-01-22 MED ORDER — HYDROCODONE-ACETAMINOPHEN 5-325 MG PO TABS: 1.0000 | ORAL_TABLET | ORAL | Status: DC | PRN

## 2018-01-22 MED ORDER — CARBAMAZEPINE 200 MG PO TABS
300.0000 mg | ORAL_TABLET | Freq: Every day | ORAL | Status: DC
  Administered 2018-01-22: 300 mg via ORAL
  Filled 2018-01-22: qty 1.5

## 2018-01-22 MED ORDER — FENTANYL CITRATE (PF) 100 MCG/2ML IJ SOLN
INTRAMUSCULAR | Status: AC
  Administered 2018-01-22: 50 ug via INTRAVENOUS
  Filled 2018-01-22: qty 2

## 2018-01-22 MED ORDER — INSULIN ASPART 100 UNIT/ML ~~LOC~~ SOLN
4.0000 [IU] | Freq: Three times a day (TID) | SUBCUTANEOUS | Status: DC
  Administered 2018-01-22 – 2018-01-23 (×2): 4 [IU] via SUBCUTANEOUS

## 2018-01-22 MED ORDER — ONDANSETRON HCL 4 MG/2ML IJ SOLN
4.0000 mg | Freq: Four times a day (QID) | INTRAMUSCULAR | Status: DC | PRN
Start: 2018-01-22 — End: 2018-01-23

## 2018-01-22 MED ORDER — ALBUTEROL SULFATE (2.5 MG/3ML) 0.083% IN NEBU: 3.0000 mL | INHALATION_SOLUTION | RESPIRATORY_TRACT | Status: DC | PRN

## 2018-01-22 MED ORDER — HYDRALAZINE HCL 50 MG PO TABS
50.0000 mg | ORAL_TABLET | Freq: Every day | ORAL | Status: DC
  Administered 2018-01-22 – 2018-01-23 (×2): 50 mg via ORAL
  Filled 2018-01-22 (×2): qty 1

## 2018-01-22 MED ORDER — HYDROCODONE-ACETAMINOPHEN 7.5-325 MG PO TABS: 1.0000 | ORAL_TABLET | ORAL | Status: DC | PRN

## 2018-01-22 MED ORDER — FENTANYL CITRATE (PF) 100 MCG/2ML IJ SOLN
50.0000 ug | INTRAMUSCULAR | Status: DC | PRN
  Administered 2018-01-22: 50 ug via INTRAVENOUS

## 2018-01-22 MED ORDER — DEXAMETHASONE SODIUM PHOSPHATE 10 MG/ML IJ SOLN
INTRAMUSCULAR | Status: DC | PRN
  Administered 2018-01-22: 5 mg via INTRAVENOUS

## 2018-01-22 MED ORDER — MIDAZOLAM HCL 2 MG/2ML IJ SOLN
INTRAMUSCULAR | Status: AC
  Administered 2018-01-22: 1 mg via INTRAVENOUS
  Filled 2018-01-22: qty 2

## 2018-01-22 MED ORDER — PROPOFOL 10 MG/ML IV BOLUS
INTRAVENOUS | Status: AC
  Filled 2018-01-22: qty 60

## 2018-01-22 MED ORDER — PROPOFOL 10 MG/ML IV BOLUS
INTRAVENOUS | Status: AC
  Filled 2018-01-22: qty 20

## 2018-01-22 MED ORDER — METOPROLOL TARTRATE 100 MG PO TABS
200.0000 mg | ORAL_TABLET | Freq: Two times a day (BID) | ORAL | Status: DC
  Administered 2018-01-22 – 2018-01-23 (×2): 200 mg via ORAL
  Filled 2018-01-22 (×2): qty 2

## 2018-01-22 MED ORDER — CARBAMAZEPINE 200 MG PO TABS
200.0000 mg | ORAL_TABLET | Freq: Every day | ORAL | Status: DC
  Administered 2018-01-22 – 2018-01-23 (×2): 200 mg via ORAL
  Filled 2018-01-22 (×2): qty 1

## 2018-01-22 MED ORDER — AMLODIPINE BESYLATE 10 MG PO TABS
10.0000 mg | ORAL_TABLET | Freq: Every day | ORAL | Status: DC
  Filled 2018-01-22: qty 1

## 2018-01-22 MED ORDER — GLIMEPIRIDE 4 MG PO TABS
4.0000 mg | ORAL_TABLET | Freq: Every day | ORAL | Status: DC
  Administered 2018-01-22 – 2018-01-23 (×2): 4 mg via ORAL
  Filled 2018-01-22 (×2): qty 1

## 2018-01-22 MED ORDER — CEFAZOLIN SODIUM-DEXTROSE 2-4 GM/100ML-% IV SOLN
2.0000 g | INTRAVENOUS | Status: AC
  Administered 2018-01-22: 2 g via INTRAVENOUS
  Filled 2018-01-22: qty 100

## 2018-01-22 MED ORDER — OXYCODONE HCL 5 MG/5ML PO SOLN: 5.0000 mg | Freq: Once | ORAL | Status: DC | PRN

## 2018-01-22 MED ORDER — BISACODYL 10 MG RE SUPP: 10.0000 mg | Freq: Every day | RECTAL | Status: DC | PRN

## 2018-01-22 MED ORDER — POLYETHYLENE GLYCOL 3350 17 G PO PACK: 17.0000 g | PACK | Freq: Every day | ORAL | Status: DC | PRN

## 2018-01-22 MED ORDER — ACETAMINOPHEN 325 MG PO TABS: 325.0000 mg | ORAL_TABLET | Freq: Four times a day (QID) | ORAL | Status: DC | PRN

## 2018-01-22 MED ORDER — PHENYLEPHRINE 40 MCG/ML (10ML) SYRINGE FOR IV PUSH (FOR BLOOD PRESSURE SUPPORT)
PREFILLED_SYRINGE | INTRAVENOUS | Status: AC
  Filled 2018-01-22: qty 10

## 2018-01-22 MED ORDER — PREGABALIN 75 MG PO CAPS
150.0000 mg | ORAL_CAPSULE | Freq: Three times a day (TID) | ORAL | Status: DC
  Administered 2018-01-22 – 2018-01-23 (×3): 150 mg via ORAL
  Filled 2018-01-22 (×3): qty 2

## 2018-01-22 MED ORDER — ONDANSETRON HCL 4 MG PO TABS: 4.0000 mg | ORAL_TABLET | Freq: Four times a day (QID) | ORAL | Status: DC | PRN

## 2018-01-22 MED ORDER — ASPIRIN 81 MG PO CHEW
81.0000 mg | CHEWABLE_TABLET | Freq: Every day | ORAL | Status: DC
  Administered 2018-01-23: 81 mg via ORAL
  Filled 2018-01-22: qty 1

## 2018-01-22 MED ORDER — ALBUTEROL SULFATE 1.25 MG/3ML IN NEBU: 1.0000 | INHALATION_SOLUTION | Freq: Four times a day (QID) | RESPIRATORY_TRACT | Status: DC | PRN

## 2018-01-22 MED ORDER — DEXAMETHASONE SODIUM PHOSPHATE 10 MG/ML IJ SOLN
INTRAMUSCULAR | Status: AC
  Filled 2018-01-22: qty 2

## 2018-01-22 MED ORDER — FENTANYL CITRATE (PF) 250 MCG/5ML IJ SOLN
INTRAMUSCULAR | Status: AC
Start: 2018-01-22 — End: ?
  Filled 2018-01-22: qty 5

## 2018-01-22 SURGICAL SUPPLY — 32 items
BLADE SAW SGTL MED 73X18.5 STR (BLADE) IMPLANT
BLADE SURG 21 STRL SS (BLADE) ×3 IMPLANT
BNDG COHESIVE 4X5 TAN STRL (GAUZE/BANDAGES/DRESSINGS) ×3 IMPLANT
BNDG COHESIVE 6X5 TAN STRL LF (GAUZE/BANDAGES/DRESSINGS) ×3 IMPLANT
BNDG GAUZE ELAST 4 BULKY (GAUZE/BANDAGES/DRESSINGS) ×3 IMPLANT
COVER SURGICAL LIGHT HANDLE (MISCELLANEOUS) ×6 IMPLANT
DRAPE U-SHAPE 47X51 STRL (DRAPES) ×6 IMPLANT
DRESSING ADAPTIC 1/2  N-ADH (PACKING) ×3 IMPLANT
DRSG ADAPTIC 3X8 NADH LF (GAUZE/BANDAGES/DRESSINGS) ×3 IMPLANT
DRSG PAD ABDOMINAL 8X10 ST (GAUZE/BANDAGES/DRESSINGS) ×6 IMPLANT
DURAPREP 26ML APPLICATOR (WOUND CARE) ×3 IMPLANT
ELECT REM PT RETURN 9FT ADLT (ELECTROSURGICAL) ×3
ELECTRODE REM PT RTRN 9FT ADLT (ELECTROSURGICAL) ×1 IMPLANT
GAUZE SPONGE 4X4 12PLY STRL (GAUZE/BANDAGES/DRESSINGS) ×3 IMPLANT
GAUZE SPONGE 4X4 12PLY STRL LF (GAUZE/BANDAGES/DRESSINGS) ×3 IMPLANT
GLOVE BIOGEL PI IND STRL 9 (GLOVE) ×1 IMPLANT
GLOVE BIOGEL PI INDICATOR 9 (GLOVE) ×2
GLOVE SURG ORTHO 9.0 STRL STRW (GLOVE) ×3 IMPLANT
GOWN STRL REUS W/ TWL XL LVL3 (GOWN DISPOSABLE) ×2 IMPLANT
GOWN STRL REUS W/TWL XL LVL3 (GOWN DISPOSABLE) ×6
KIT BASIN OR (CUSTOM PROCEDURE TRAY) ×3 IMPLANT
KIT TURNOVER KIT B (KITS) ×3 IMPLANT
NS IRRIG 1000ML POUR BTL (IV SOLUTION) ×3 IMPLANT
PACK ORTHO EXTREMITY (CUSTOM PROCEDURE TRAY) ×3 IMPLANT
PAD ABD 8X10 STRL (GAUZE/BANDAGES/DRESSINGS) ×3 IMPLANT
PAD ARMBOARD 7.5X6 YLW CONV (MISCELLANEOUS) ×6 IMPLANT
STOCKINETTE IMPERVIOUS LG (DRAPES) IMPLANT
SUT ETHILON 2 0 PSLX (SUTURE) ×6 IMPLANT
TOWEL OR 17X26 10 PK STRL BLUE (TOWEL DISPOSABLE) ×3 IMPLANT
TUBE CONNECTING 12'X1/4 (SUCTIONS) ×1
TUBE CONNECTING 12X1/4 (SUCTIONS) ×2 IMPLANT
YANKAUER SUCT BULB TIP NO VENT (SUCTIONS) ×3 IMPLANT

## 2018-01-22 NOTE — Anesthesia Preprocedure Evaluation (Addendum)
Anesthesia Evaluation  Patient identified by MRN, date of birth, ID band Patient awake    Reviewed: Allergy & Precautions, H&P , NPO status , Patient's Chart, lab work & pertinent test results  History of Anesthesia Complications Negative for: history of anesthetic complications  Airway Mallampati: II  TM Distance: >3 FB Neck ROM: full    Dental no notable dental hx. (+) Edentulous Upper, Edentulous Lower   Pulmonary asthma , COPD, former smoker,    Pulmonary exam normal breath sounds clear to auscultation       Cardiovascular hypertension, Pt. on medications Normal cardiovascular exam Rhythm:regular Rate:Normal     Neuro/Psych negative neurological ROS  negative psych ROS   GI/Hepatic Neg liver ROS, GERD  ,  Endo/Other  diabetes, Poorly Controlled  Renal/GU negative Renal ROS  negative genitourinary   Musculoskeletal negative musculoskeletal ROS (+) Arthritis ,   Abdominal Normal abdominal exam  (+)   Peds negative pediatric ROS (+)  Hematology negative hematology ROS (+)   Anesthesia Other Findings   Reproductive/Obstetrics negative OB ROS                            Anesthesia Physical  Anesthesia Plan  ASA: III  Anesthesia Plan: MAC   Post-op Pain Management:    Induction: Intravenous  PONV Risk Score and Plan: 1 and Ondansetron, Midazolam and Treatment may vary due to age or medical condition  Airway Management Planned: Simple Face Mask  Additional Equipment:   Intra-op Plan:   Post-operative Plan:   Informed Consent: I have reviewed the patients History and Physical, chart, labs and discussed the procedure including the risks, benefits and alternatives for the proposed anesthesia with the patient or authorized representative who has indicated his/her understanding and acceptance.   Dental Advisory Given  Plan Discussed with: Anesthesiologist, CRNA and  Surgeon  Anesthesia Plan Comments:        Anesthesia Quick Evaluation

## 2018-01-22 NOTE — H&P (Signed)
Leslie [name] is an 72 y.o. male.   Chief Complaint: Gangrene third toe right foot. HPI: Patient is a 72 year old gentleman with peripheral vascular disease who was seen for dry gangrene right foot third toe.  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  Patient's leg is dependent and has chronic venous insufficiency he states that his leg is much smaller than it normally is.  Patient has recently been seen in the office with Dr. Donnella Bi and I have reconsulted Dr. Gwenlyn Found regarding circulation in the right lower extremity. Per Dr. Gwenlyn Found patient has a non-reconstructable completely occluded SFA.  Will attempt a ray amputation however patient is at high risk of the wound not healing.    Past Medical History:  Diagnosis Date  . Anxiety    Panic attack - 10  years aago- ? panic   . Asthma   . Cellulitis of left foot 2016   Healed and recurred 01/2016  . Cellulitis of right foot 06/30/2012  . Chronic lower back pain 07/01/2012  . COPD (chronic obstructive pulmonary disease) (HCC)    Uses nebulizers at home. Former smoker, current chewing tobacco. 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  Marland Kitchen GERD (gastroesophageal reflux disease)   . Gouty arthritis 07/01/2012   Not attacks in ~5 yrs (03/2016)  . Hepatitis 1967   "in jail when I got it; dr said it was pretty bad; quaranteened X 30d"  . Hyperlipidemia   . Hypertension   . Lung nodule, solitary 07/2015   Due for 1-yr recheck in 07/2017  . Peripheral arterial disease ([place])    critical limb ischemia  . Peripheral neuropathy 07/01/2012  . Seasonal allergies   . Shortness of breath dyspnea   . Skin growth 07/01/2012   right side of nares; "been on there 20 years"  . Substance abuse ([place])   . Type II diabetes mellitus ([place]) 2002   Diagnosed in 2002    Past Surgical History:  Procedure Laterality Date  . AMPUTATION Left 03/28/2016   Procedure: AMPUTATION left foot 4th and 5 th ray;  Surgeon: Newt Minion, MD;  Location: [facility];   Service: Orthopedics;  Laterality: Left;  . AMPUTATION Left 04/11/2016   Procedure: LEFT BELOW KNEE AMPUTATION;  Surgeon: Newt Minion, MD;  Location: [facility];  Service: Orthopedics;  Laterality: Left;  . LEG AMPUTATION BELOW KNEE Left 04/11/2016  . LOWER EXTREMITY ANGIOGRAM N/A 04/18/2013   Procedure: LOWER EXTREMITY ANGIOGRAM;  Surgeon: Sherren Mocha, MD;  Location: [facility] CATH LAB;  Service: Cardiovascular;  Laterality: N/A;  . LOWER EXTREMITY ANGIOGRAM  08/13/2015   bilateral iliac   . PERIPHERAL VASCULAR CATHETERIZATION Bilateral 08/13/2015   Procedure: Lower Extremity Angiography;  Surgeon: Lorretta Harp, MD;  Location: [facility] CV LAB;  Service: Cardiovascular;  Laterality: Bilateral;  . PERIPHERAL VASCULAR CATHETERIZATION N/A 08/13/2015   Procedure: Abdominal Aortogram;  Surgeon: Lorretta Harp, MD;  Location: [facility] CV LAB;  Service: Cardiovascular;  Laterality: N/A;    Family History  Problem Relation Age of Onset  . Diabetes Sister   . Hypertension Mother    Social History:  reports that he quit smoking about 16 years ago. His smoking use included cigarettes. He has a 80.00 pack-year smoking history. His smokeless tobacco use includes chew. He reports that he drinks about 0.6 oz of alcohol per week. He reports that he does not use drugs.  Allergies:  Allergies  Allergen Reactions  . Flexeril [Cyclobenzaprine] Other (See Comments)  Dry mouth and hallucinations.     Medications Prior to Admission  Medication Sig Dispense Refill  . albuterol (ACCUNEB) 1.25 MG/3ML nebulizer solution Take 1 ampule by nebulization every 6 (six) hours as needed for wheezing.    Marland Kitchen albuterol (PROVENTIL HFA;VENTOLIN HFA) 108 (90 Base) MCG/ACT inhaler Inhale 2 puffs into the lungs every 4 (four) hours as needed for shortness of breath.    . allopurinol (ZYLOPRIM) 300 MG tablet take 1 tablet by mouth once daily 30 tablet 0  . amLODipine (NORVASC) 10 MG tablet Take 1 tablet (10 mg total) by  mouth daily. 90 tablet 3  . aspirin 81 MG tablet Take 81 mg by mouth daily.    . carbamazepine (TEGRETOL) 200 MG tablet Take 200-300 mg by mouth 2 (two) times daily. Take one tablet (200mg ) in the morning and one and a half tablets (300mg ) in the evening.    . cholecalciferol (VITAMIN D) 1000 UNITS tablet Take 1,000 Units by mouth daily.    . clonazePAM (KLONOPIN) 1 MG tablet Take 1 mg by mouth daily.    Marland Kitchen doxycycline (DORYX) 100 MG EC tablet Take 100 mg by mouth as directed.    . DULoxetine (CYMBALTA) 60 MG capsule Take 60 mg by mouth daily.    . Evolocumab (REPATHA SURECLICK) 381 MG/ML SOAJ Inject into the skin.    . furosemide (LASIX) 20 MG tablet Take 20 mg by mouth daily.    Marland Kitchen glimepiride (AMARYL) 4 MG tablet Take 4 mg by mouth daily with breakfast.    . guaiFENesin (SILTUSSIN DAS) 100 MG/5ML liquid Take 200 mg by mouth every 4 (four) hours.    . hydrALAZINE (APRESOLINE) 50 MG tablet Take 50 mg by mouth daily.    Marland Kitchen HYDROcodone-acetaminophen (NORCO) 10-325 MG tablet Take 1 tablet by mouth 3 (three) times daily.    . insulin glargine (LANTUS) 100 UNIT/ML injection Inject 15 Units into the skin at bedtime.    Marland Kitchen losartan (COZAAR) 100 MG tablet Take 100 mg by mouth daily.    . Menthol, Topical Analgesic, (BIOFREEZE) 4 % GEL Apply 1 application topically 2 (two) times daily.    . metFORMIN (GLUCOPHAGE) 1000 MG tablet Take 1 tablet (1,000 mg total) by mouth 2 (two) times daily with a meal. 180 tablet 3  . metoprolol (LOPRESSOR) 100 MG tablet Take 2 tablets (200 mg total) by mouth 2 (two) times daily. 180 tablet 3  . mirtazapine (REMERON) 7.5 MG tablet Take 7.5 mg by mouth at bedtime.    . Multiple Vitamin (MULTIVITAMIN WITH MINERALS) TABS tablet Take 1 tablet by mouth daily.    . nitroGLYCERIN (NITROSTAT) 0.4 MG SL tablet Place 0.4 mg under the tongue every 5 (five) minutes as needed for chest pain.     Marland Kitchen nystatin (NYSTATIN) powder Apply 1 Bottle topically 4 (four) times daily.    . pregabalin  (LYRICA) 150 MG capsule Take 150 mg by mouth 2 (two) times daily.    . ranitidine (ZANTAC) 300 MG capsule Take 300 mg by mouth every evening.    . senna (SENOKOT) 8.6 MG tablet Take 2 tablets by mouth at bedtime.       No results found for this or any previous visit (from the past 48 hour(s)). No results found.  Review of Systems  All other systems reviewed and are negative.   There were no vitals taken for this visit. Physical Exam  Patient is alert, oriented, no adenopathy, well-dressed, normal affect, normal respiratory effort. On examination  patient has weeping edema in the right leg with venous insufficiency with dermatitis.  He has a venous ulcer posteriorly over the calf but no infection.  He does not have palpable pulses but he does have a dopplerable dorsalis pedis pulse.   Assessment/Plan Assessment: Severe peripheral vascular disease with gangrene of the right third toe with completely occluded SFA.  Plan: We will plan for third ray amputation right foot risk and benefits were discussed including high potential needing a higher level amputation.  Patient states he understands wished to proceed at this time.  Newt Minion, MD 01/22/2018, 6:34 AM

## 2018-01-22 NOTE — Social Work (Signed)
CSW acknowledging SNF consult, await PT/OT recommendations. Pt is a participant with [facility].   Alexander Mt, [place] Work [phone]

## 2018-01-22 NOTE — Op Note (Signed)
01/22/2018  9:19 AM  PATIENT:  Leslie [name]    PRE-OPERATIVE DIAGNOSIS:  Right Foot 3rd Toe Gangrene  POST-OPERATIVE DIAGNOSIS:  Same  PROCEDURE:  RIGHT FOOT 3RD RAY AMPUTATION  SURGEON:  Newt Minion, MD  PHYSICIAN ASSISTANT:None ANESTHESIA:   General  PREOPERATIVE INDICATIONS:  [name] is a  72 y.o. male with a diagnosis of Right Foot 3rd Toe Gangrene who failed conservative measures and elected for surgical management.    The risks benefits and alternatives were discussed with the patient preoperatively including but not limited to the risks of infection, bleeding, nerve injury, cardiopulmonary complications, the need for revision surgery, among others, and the patient was willing to proceed.  OPERATIVE IMPLANTS: None  @ENCIMAGES @  OPERATIVE FINDINGS: Ischemic changes minimal petechial bleeding  OPERATIVE PROCEDURE: Patient was brought the operating room and underwent a regional block.  After adequate levels of anesthesia were obtained patient's right lower extremity was prepped using DuraPrep draped in the sterile field a timeout was called.  The incision was made around the third ray the ray was resected through the base of the metatarsal.  Patient has ischemic soft tissue changes and this was debrided with a rondure.  The wound was irrigated with normal saline there was minimal petechial bleeding.  The incision was closed with 2-0 nylon.  A sterile compressive dressing was applied patient was taken the PACU in stable condition.   DISCHARGE PLANNING:  Antibiotic duration: Perioperative  Weightbearing: Minimize weightbearing the right patient is a left transtibial amputee  Pain medication: Patient has Vicodin at home  Dressing care/ Wound VAC: Dry dressing until follow-up  Ambulatory devices: Walker  Discharge to: Home  Follow-up: In the office 1 week post operative.

## 2018-01-22 NOTE — Anesthesia Postprocedure Evaluation (Signed)
Anesthesia Post Note  Patient: Leslie [name]  Procedure(s) Performed: RIGHT FOOT 3RD RAY AMPUTATION (Right Foot)     Patient location during evaluation: PACU Anesthesia Type: MAC Level of consciousness: awake and alert Pain management: pain level controlled Vital Signs Assessment: post-procedure vital signs reviewed and stable Respiratory status: spontaneous breathing, nonlabored ventilation and respiratory function stable Cardiovascular status: stable and blood pressure returned to baseline Postop Assessment: no apparent nausea or vomiting Anesthetic complications: no    Last Vitals:  Vitals:   01/22/18 1000 01/22/18 1015  BP: (!) 146/68 (!) 168/76  Pulse: 69 73  Resp:    Temp:    SpO2: 97% 94%    Last Pain:  Vitals:   01/22/18 0954  TempSrc:   PainSc: 0-No pain                 Lynda Rainwater

## 2018-01-22 NOTE — Anesthesia Procedure Notes (Signed)
Anesthesia Regional Block: Popliteal block   Pre-Anesthetic Checklist: ,, timeout performed, Correct Patient, Correct Site, Correct Laterality, Correct Procedure, Correct Position, site marked, Risks and benefits discussed,  Surgical consent,  Pre-op evaluation,  At surgeon's request and post-op pain management  Laterality: Right  Prep: chloraprep       Needles:  Injection technique: Single-shot  Needle Type: Stimiplex     Needle Length: 9cm  Needle Gauge: 21     Additional Needles:   Procedures:,,,, ultrasound used (permanent image in chart),,,,  Narrative:  Start time: 01/22/2018 8:00 AM End time: 01/22/2018 8:05 AM Injection made incrementally with aspirations every 5 mL.  Performed by: Personally  Anesthesiologist: Lynda Rainwater, MD

## 2018-01-22 NOTE — Discharge Instructions (Signed)

## 2018-01-22 NOTE — Transfer of Care (Signed)
Immediate Anesthesia Transfer of Care Note  Patient: Leslie [name]  Procedure(s) Performed: RIGHT FOOT 3RD RAY AMPUTATION (Right Foot)  Patient Location: PACU  Anesthesia Type:MAC combined with regional for post-op pain  Level of Consciousness: awake, alert , oriented and patient cooperative  Airway & Oxygen Therapy: Patient Spontanous Breathing and Patient connected to nasal cannula oxygen  Post-op Assessment: Report given to RN, Post -op Vital signs reviewed and stable and Patient moving all extremities  Post vital signs: Reviewed and stable  Last Vitals:  Vitals Value Taken Time  BP    Temp    Pulse    Resp    SpO2      Last Pain:  Vitals:   01/22/18 0725  TempSrc:   PainSc: 9       Patients Stated Pain Goal: 8 (18/40/37 5436)  Complications: No apparent anesthesia complications

## 2018-01-23 ENCOUNTER — Encounter (HOSPITAL_COMMUNITY): Payer: Self-pay | Admitting: Orthopedic Surgery

## 2018-01-23 DIAGNOSIS — E1152 Type 2 diabetes mellitus with diabetic peripheral angiopathy with gangrene: Secondary | ICD-10-CM | POA: Diagnosis not present

## 2018-01-23 LAB — GLUCOSE, CAPILLARY: GLUCOSE-CAPILLARY: 147 mg/dL — AB (ref 65–99)

## 2018-01-23 NOTE — Progress Notes (Signed)
Pt stable Dressing dry Plan dc to home - no rx needed

## 2018-01-23 NOTE — Progress Notes (Signed)
Discharge instructions reviewed with the Pt and 11 AM.  Discharge instructions given to patient.  Pt taken to front via his w/c by student nurse.

## 2018-01-25 ENCOUNTER — Telehealth (INDEPENDENT_AMBULATORY_CARE_PROVIDER_SITE_OTHER): Payer: Self-pay

## 2018-01-25 NOTE — Telephone Encounter (Signed)
Dr. Sharol Given wrote rx for / compression socks for the Leslie [name] and he will get rx when he comes in for his appt Wednesday.  [facility] is aware.

## 2018-01-25 NOTE — Telephone Encounter (Signed)
Called and sw beverly and she states that the rx was for compression socks. This was given in error. Pt also had multiple appts on the sch for the office this week. Advised that we would cancel the appt for tomorrow and keep the nurse only visit as he still has his surgical dressing intact and we would just remove and eval incision and give orders for daily dressing changes.

## 2018-01-25 NOTE — Telephone Encounter (Signed)
Received call from nurse @ [facility] stating she was given a rx that was supposed to be for this patient but it was for a different patient. I advised her to shred incorrect rx and she would like to know what is needed for this patient.

## 2018-01-26 ENCOUNTER — Ambulatory Visit (INDEPENDENT_AMBULATORY_CARE_PROVIDER_SITE_OTHER): Payer: Medicare (Managed Care) | Admitting: Orthopedic Surgery

## 2018-01-27 ENCOUNTER — Ambulatory Visit (INDEPENDENT_AMBULATORY_CARE_PROVIDER_SITE_OTHER): Payer: Medicare (Managed Care)

## 2018-01-27 ENCOUNTER — Encounter (INDEPENDENT_AMBULATORY_CARE_PROVIDER_SITE_OTHER): Payer: Self-pay

## 2018-01-27 VITALS — Ht 64.0 in | Wt 233.0 lb

## 2018-01-27 DIAGNOSIS — Z89421 Acquired absence of other right toe(s): Secondary | ICD-10-CM

## 2018-01-27 NOTE — Progress Notes (Signed)
Patient is in the office today 5 days s/p a right foot 3rd ray amputation 01/22/18. He ambulates in a wheelchair.  Dressing removed and the pt's incision is open between the toes. There is a small amount of dark bloody drainage. There is a some swelling. There is no odor or redness. He is currently taking Doxycycline as listed on his med list from [facility]. A dry dressing is applied and an ace bandage applied from the toes up to below the knee. Orders written for facility to wash the area with dial soap and water and change dry dressing daily. Advised to elevate foot higher than his heart and remain non weight bearing. The pt was given an rx written fo a compression sock by Dr. Sharol Given yesterday. Advised in orders pt is to obtain this but to continue with this dry dressing change until his appt next week with Dr. Sharol Given. To call with any questions or concerns.    Jashua Knaak, [place], [employer]

## 2018-01-28 ENCOUNTER — Ambulatory Visit (INDEPENDENT_AMBULATORY_CARE_PROVIDER_SITE_OTHER): Payer: Medicare (Managed Care) | Admitting: Orthopedic Surgery

## 2018-02-01 ENCOUNTER — Telehealth (INDEPENDENT_AMBULATORY_CARE_PROVIDER_SITE_OTHER): Payer: Self-pay | Admitting: Orthopedic Surgery

## 2018-02-01 NOTE — Telephone Encounter (Signed)
Called and lm on vm to advise that the pt is just 2 weeks out from surgery and should have been non wtb and elevating foot. Not sure how the incision could come open but if he needs to be seen sooner than his appt on Thursday to call the office and we could move it up to tomorrow afternoon. Or they can continue with dry dressing changes daily and keep the appt for this week if they do not think he looks infected or needs to be sen emergently.

## 2018-02-01 NOTE — Telephone Encounter (Signed)
Kennyth Lose left a voicemail wanting to let Dr. Sharol Given know that the patients post surgical wound has broken open and is no longer being held closed by the stiches. CB # [phone]

## 2018-02-04 ENCOUNTER — Encounter (INDEPENDENT_AMBULATORY_CARE_PROVIDER_SITE_OTHER): Payer: Self-pay | Admitting: Orthopedic Surgery

## 2018-02-04 ENCOUNTER — Ambulatory Visit (INDEPENDENT_AMBULATORY_CARE_PROVIDER_SITE_OTHER): Payer: Medicare (Managed Care) | Admitting: Orthopedic Surgery

## 2018-02-04 DIAGNOSIS — Z89421 Acquired absence of other right toe(s): Secondary | ICD-10-CM

## 2018-02-04 MED ORDER — PENTOXIFYLLINE ER 400 MG PO TBCR: 400.0000 mg | EXTENDED_RELEASE_TABLET | Freq: Three times a day (TID) | ORAL | 3 refills | Status: DC

## 2018-02-04 MED ORDER — NITROGLYCERIN 0.2 MG/HR TD PT24: 0.2000 mg | MEDICATED_PATCH | Freq: Every day | TRANSDERMAL | 12 refills | Status: DC

## 2018-02-04 NOTE — Progress Notes (Signed)
Office Visit Note   Patient: Leslie [name]           Date of Birth: 03-30-1946           MRN: [id number] Visit Date: 02/04/2018              Requested by: [name], MD [address] [place], [place] [address] PCP: [name], MD  Chief Complaint  Patient presents with  . Right Foot - Pain      HPI: Patient is a 72 year old gentleman left below-knee amputation status post third ray amputation on the right who has [facility].  Patient states he is essentially nonweightbearing.  Patient states that he used to be a heavy smoker but does not smoke now.  Assessment & Plan: Visit Diagnoses:  1. 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  We will start him on a nitroglycerin patch to be applied dorsally over the foot and ankle to be changed daily Dial soap cleansing daily and Trental to be taken 3 times a day to help with microcirculation repeat evaluation and 1 week.  Discussed the importance of nonweightbearing on the right foot.  Patient states he gets up to go the bathroom and states that she cannot keep the postoperative shoe on his foot.  Follow-Up Instructions: Return in about 1 week (around 02/11/2018).   Ortho Exam  Patient is alert, oriented, no adenopathy, well-dressed, normal affect, normal respiratory effort. Examination patient has venous stasis swelling and brawny skin color changes.  Patient has a strong dorsalis pedis biphasic pulse with no microcirculatory insufficiency.  Patient does have wound dehiscence on the plantar aspect of the dorsum is well approximated.  There is a large amount of hematoma which was removed.  The wound is packed open.  Imaging: No results found. No images are attached to the encounter.  Labs: Lab  Results  Component Value Date   HGBA1C 7.2 (H) 03/27/2016   HGBA1C 6.9 11/19/2012   HGBA1C 6.0 08/09/2012   ESRSEDRATE 90 (H) 03/27/2016   CRP 23.4 (H) 03/27/2016   LABURIC 4.8 08/09/2012   REPTSTATUS 05/08/2011 FINAL 05/01/2011   CULT NO GROWTH 5 DAYS 05/01/2011    @LABSALLVALUES (HGBA1)@  There is no height or weight on file to calculate BMI.  Orders:  No orders of the defined types were placed in this encounter.  No orders of the defined types were placed in this encounter.    Procedures: No procedures performed  Clinical Data: No additional findings.  ROS:  All other systems negative, except as noted in the HPI. Review of Systems  Objective: Vital Signs: There were no vitals taken for this visit.  Specialty Comments:  No specialty comments available.  PMFS History: Patient Active Problem List   Diagnosis Date Noted  . Amputated toe of right foot ([place]) 01/22/2018  . Gangrene of toe of right foot ([place])   . 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 Below knee amputation status ([place]) 04/11/2016  . History of tobacco abuse 03/31/2016  . Osteomyelitis of left foot ([place])   . 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 Septic  arthritis of left foot ([place]) 03/27/2016  . Solitary pulmonary nodule 08/14/2015  . Precordial chest pain 08/13/2015  . Peripheral arterial disease ([place]) 04/18/2015  . 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 Right anterior knee pain 11/19/2012  . Toenail deformity 01/16/2012  . Gout   . Diabetic neuropathy ([place]) 03/31/2008  . ALCOHOLISM 03/31/2008  . Rosacea 03/31/2008  . GERD 01/31/2008  . COPD (chronic obstructive pulmonary disease) ([place]) 08/03/2007  . DM W/COMPLICATION NOS, TYPE II 05/08/2007  . OBESITY 05/08/2007  . HTN (hypertension) 05/08/2007  . 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 HISTORY OF  ASBESTOS EXPOSURE 05/08/2007  . Chronic lower back pain on narcotics  06/16/2005  . HLD (hyperlipidemia) 03/22/2002   Past Medical History:  Diagnosis Date  . Anxiety    Panic attack - 10  years aago- ? panic   . Asthma   . Cellulitis of left foot 2016   Healed and recurred 01/2016  . Cellulitis of right foot 06/30/2012  . Chronic lower back pain 07/01/2012  . COPD (chronic obstructive pulmonary disease) (HCC)    Uses nebulizers at home. Former smoker, current chewing tobacco. 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  Marland Kitchen GERD (gastroesophageal reflux disease)   . Gouty arthritis 07/01/2012   Not attacks in ~5 yrs (03/2016)  . Hepatitis 1967   "in jail when I got it; dr said it was pretty bad; quaranteened X 30d"  . Hyperlipidemia   . Hypertension   . Lung nodule, solitary 07/2015   Due for 1-yr recheck in 07/2017  . Peripheral arterial disease ([place])    critical limb ischemia  . Peripheral neuropathy 07/01/2012  . Seasonal allergies   . Shortness of breath dyspnea   . Skin growth 07/01/2012   right side of nares; "been on there 20 years"  . Substance abuse ([place])   . Type II diabetes mellitus ([place]) 2002   Diagnosed in 2002    Family History  Problem Relation Age of Onset  . Diabetes Sister   . Hypertension Mother     Past Surgical History:  Procedure Laterality Date  . AMPUTATION Left 03/28/2016   Procedure: AMPUTATION left foot 4th and 5 th ray;  Surgeon: Newt Minion, MD;  Location: [facility];  Service: Orthopedics;  Laterality: Left;  . AMPUTATION Left 04/11/2016   Procedure: LEFT BELOW KNEE AMPUTATION;  Surgeon: Newt Minion, MD;  Location: [facility];  Service: Orthopedics;  Laterality: Left;  . AMPUTATION Right 01/22/2018   Procedure: RIGHT FOOT 3RD RAY AMPUTATION;  Surgeon: Newt Minion, MD;  Location: [facility];  Service: Orthopedics;  Laterality: Right;  . LEG AMPUTATION BELOW KNEE Left 04/11/2016  . LOWER EXTREMITY ANGIOGRAM N/A 04/18/2013   Procedure: LOWER EXTREMITY ANGIOGRAM;  Surgeon: Sherren Mocha, MD;   Location: [facility] CATH LAB;  Service: Cardiovascular;  Laterality: N/A;  . LOWER EXTREMITY ANGIOGRAM  08/13/2015   bilateral iliac   . PERIPHERAL VASCULAR CATHETERIZATION Bilateral 08/13/2015   Procedure: Lower Extremity Angiography;  Surgeon: Lorretta Harp, MD;  Location: [facility] CV LAB;  Service: Cardiovascular;  Laterality: Bilateral;  . PERIPHERAL VASCULAR CATHETERIZATION N/A 08/13/2015   Procedure: Abdominal Aortogram;  Surgeon: Lorretta Harp, MD;  Location: [facility] CV LAB;  Service: Cardiovascular;  Laterality: N/A;  . TOE AMPUTATION Right 01/22/2018   3RD TOE RIGHT FOOT   Social History   Occupational History  . Occupation: disabled  Tobacco Use  . Smoking status: Former Smoker    Packs/day: 2.00    Years: 40.00    Pack years:  80.00    Types: Cigarettes    Last attempt to quit: 01/13/2002    Years since quitting: 16.0  . Smokeless tobacco: Current User    Types: Chew  . Tobacco comment: chews a little tobacco  Substance and Sexual Activity  . Alcohol use: Yes    Alcohol/week: 0.6 oz    Types: 1 Cans of beer per week    Comment: rarely - "If I want one I'll drink 2-3  . Drug use: No    Types: Marijuana    Comment: 03/27/2016 "last marijuana was maybe in 2013"  . Sexual activity: Never

## 2018-02-11 ENCOUNTER — Encounter (INDEPENDENT_AMBULATORY_CARE_PROVIDER_SITE_OTHER): Payer: Self-pay | Admitting: Orthopedic Surgery

## 2018-02-11 ENCOUNTER — Ambulatory Visit (INDEPENDENT_AMBULATORY_CARE_PROVIDER_SITE_OTHER): Payer: Medicare (Managed Care) | Admitting: Orthopedic Surgery

## 2018-02-11 ENCOUNTER — Telehealth (INDEPENDENT_AMBULATORY_CARE_PROVIDER_SITE_OTHER): Payer: Self-pay | Admitting: Orthopedic Surgery

## 2018-02-11 DIAGNOSIS — I96 Gangrene, not elsewhere classified: Secondary | ICD-10-CM

## 2018-02-11 DIAGNOSIS — T8781 Dehiscence of amputation stump: Secondary | ICD-10-CM

## 2018-02-11 NOTE — Telephone Encounter (Signed)
[facility]  Alleen Borne  [phone]    Please call Alleen Borne  to discuss surgery

## 2018-02-11 NOTE — Progress Notes (Signed)
Office Visit Note   Patient: Leslie [name]           Date of Birth: November 20, 1945           MRN: [id number] Visit Date: 02/11/2018              Requested by: [name], MD [address] [place], [place] [address] PCP: [name], MD  Chief Complaint  Patient presents with  . Right Foot - Routine Post Op, Follow-up, Edema, Open Wound      HPI: Patient is a 72 year old gentleman with peripheral vascular disease status post right foot third ray amputation.  Patient has had progressive dehiscence now with black gangrenous changes of the fourth toe.  Patient states he has not been obtaining wound care without washing or dressing changes.  He complains of drainage and odor and swelling and redness.  He is 3 weeks out from the third ray amputation.  Assessment & Plan: Visit Diagnoses:  1. Dehiscence of amputation stump (HCC)   2. Gangrene of toe of right foot ([place])     Plan: Discussed with patient with his progressive gangrenous changes of the forefoot his only option for foot salvage would be a transmetatarsal amputation.  Discussed that this has a high risk of not healing approximately 50%.  Discussed that his best option would be to proceed with a transtibial amputation like he has on the left.  Patient states he is not ready to consider a transtibial amputation and would like to proceed with a transmetatarsal amputation.  We will set this up for him for next Friday.  Patient will continue with wound care until that time.  Follow-Up Instructions: Return in about 2 weeks (around 02/25/2018).   Ortho Exam  Patient is alert, oriented, no adenopathy, well-dressed, normal affect, normal respiratory effort. Examination patient relates in a wheelchair.  There is dehiscence of the right knee amputation that is black gangrenous changes of the fourth toe.  A Doppler was used and patient has a strong biphasic dorsalis pedis and posterior tibial pulse.  The pulses are palpable but he  does have swelling.  His problem seems to be more microcirculatory with good pulses down to the ankle.  Imaging: No results found. No images are attached to the encounter.  Labs: Lab Results  Component Value Date   HGBA1C 7.2 (H) 03/27/2016   HGBA1C 6.9 11/19/2012   HGBA1C 6.0 08/09/2012   ESRSEDRATE 90 (H) 03/27/2016   CRP 23.4 (H) 03/27/2016   LABURIC 4.8 08/09/2012   REPTSTATUS 05/08/2011 FINAL 05/01/2011   CULT NO GROWTH 5 DAYS 05/01/2011    @LABSALLVALUES (HGBA1)@  There is no height or weight on file to calculate BMI.  Orders:  No orders of the defined types were placed in this encounter.  No orders of the defined types were placed in this encounter.    Procedures: No procedures performed  Clinical Data: No additional findings.  ROS:  All other systems negative, except as noted in the HPI. Review of Systems  Objective: Vital Signs: There were no vitals taken for this visit.  Specialty Comments:  No specialty comments available.  PMFS History: Patient Active Problem List   Diagnosis Date Noted  . Amputated toe of right foot ([place]) 01/22/2018  . Gangrene of toe of right foot ([place])   . 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 Below knee amputation status ([place]) 04/11/2016  . History of tobacco abuse 03/31/2016  . Osteomyelitis of left foot ([place])   . 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 Septic arthritis of left foot ([place]) 03/27/2016  . Solitary pulmonary nodule 08/14/2015  . Precordial chest pain 08/13/2015  . Peripheral arterial disease ([place]) 04/18/2015  . 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 Right anterior knee pain 11/19/2012  . Toenail deformity 01/16/2012  . Gout   . Diabetic neuropathy ([place]) 03/31/2008  . ALCOHOLISM 03/31/2008  . Rosacea 03/31/2008  . GERD 01/31/2008  . COPD (chronic  obstructive pulmonary disease) ([place]) 08/03/2007  . DM W/COMPLICATION NOS, TYPE II 05/08/2007  . OBESITY 05/08/2007  . HTN (hypertension) 05/08/2007  . 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 HISTORY OF ASBESTOS EXPOSURE 05/08/2007  . Chronic lower back pain on narcotics  06/16/2005  . HLD (hyperlipidemia) 03/22/2002   Past Medical History:  Diagnosis Date  . Anxiety    Panic attack - 10  years aago- ? panic   . Asthma   . Cellulitis of left foot 2016   Healed and recurred 01/2016  . Cellulitis of right foot 06/30/2012  . Chronic lower back pain 07/01/2012  . COPD (chronic obstructive pulmonary disease) (HCC)    Uses nebulizers at home. Former smoker, current chewing tobacco. 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  Marland Kitchen GERD (gastroesophageal reflux disease)   . Gouty arthritis 07/01/2012   Not attacks in ~5 yrs (03/2016)  . Hepatitis 1967   "in jail when I got it; dr said it was pretty bad; quaranteened X 30d"  . Hyperlipidemia   . Hypertension   . Lung nodule, solitary 07/2015   Due for 1-yr recheck in 07/2017  . Peripheral arterial disease ([place])    critical limb ischemia  . Peripheral neuropathy 07/01/2012  . Seasonal allergies   . Shortness of breath dyspnea   . Skin growth 07/01/2012   right side of nares; "been on there 20 years"  . Substance abuse ([place])   . Type II diabetes mellitus ([place]) 2002   Diagnosed in 2002    Family History  Problem Relation Age of Onset  . Diabetes Sister   . Hypertension Mother     Past Surgical History:  Procedure Laterality Date  . AMPUTATION Left 03/28/2016   Procedure: AMPUTATION left foot 4th and 5 th ray;  Surgeon: Newt Minion, MD;  Location: [facility];  Service: Orthopedics;  Laterality: Left;  . AMPUTATION Left 04/11/2016   Procedure: LEFT BELOW KNEE AMPUTATION;  Surgeon: Newt Minion, MD;  Location: [facility];  Service: Orthopedics;  Laterality: Left;  . AMPUTATION Right 01/22/2018   Procedure: RIGHT FOOT 3RD RAY AMPUTATION;  Surgeon: Newt Minion, MD;  Location: [facility];   Service: Orthopedics;  Laterality: Right;  . LEG AMPUTATION BELOW KNEE Left 04/11/2016  . LOWER EXTREMITY ANGIOGRAM N/A 04/18/2013   Procedure: LOWER EXTREMITY ANGIOGRAM;  Surgeon: Sherren Mocha, MD;  Location: [facility] CATH LAB;  Service: Cardiovascular;  Laterality: N/A;  . LOWER EXTREMITY ANGIOGRAM  08/13/2015   bilateral iliac   . PERIPHERAL VASCULAR CATHETERIZATION Bilateral 08/13/2015   Procedure: Lower Extremity Angiography;  Surgeon: Lorretta Harp, MD;  Location: [facility] CV LAB;  Service: Cardiovascular;  Laterality: Bilateral;  . PERIPHERAL VASCULAR CATHETERIZATION N/A 08/13/2015   Procedure: Abdominal Aortogram;  Surgeon: Lorretta Harp, MD;  Location: [facility] CV LAB;  Service: Cardiovascular;  Laterality: N/A;  . TOE AMPUTATION Right 01/22/2018   3RD TOE RIGHT FOOT   Social  History   Occupational History  . Occupation: disabled  Tobacco Use  . Smoking status: Former Smoker    Packs/day: 2.00    Years: 40.00    Pack years: 80.00    Types: Cigarettes    Last attempt to quit: 01/13/2002    Years since quitting: 16.0  . Smokeless tobacco: Current User    Types: Chew  . Tobacco comment: chews a little tobacco  Substance and Sexual Activity  . Alcohol use: Yes    Alcohol/week: 0.6 oz    Types: 1 Cans of beer per week    Comment: rarely - "If I want one I'll drink 2-3  . Drug use: No    Types: Marijuana    Comment: 03/27/2016 "last marijuana was maybe in 2013"  . Sexual activity: Never

## 2018-02-16 NOTE — Telephone Encounter (Signed)
This was in my box. Sending just incase you need to talk with them about time?

## 2018-02-18 ENCOUNTER — Other Ambulatory Visit (INDEPENDENT_AMBULATORY_CARE_PROVIDER_SITE_OTHER): Payer: Self-pay | Admitting: Orthopedic Surgery

## 2018-02-18 ENCOUNTER — Encounter (HOSPITAL_COMMUNITY): Payer: Self-pay | Admitting: *Deleted

## 2018-02-18 ENCOUNTER — Other Ambulatory Visit: Payer: Self-pay

## 2018-02-18 DIAGNOSIS — I96 Gangrene, not elsewhere classified: Secondary | ICD-10-CM

## 2018-02-18 NOTE — Progress Notes (Signed)
I spoke to Mr Leslie [name].  Mr Leslie [name] reports that his medication are packaged by [facility] and he can not identify medications.patient also does not check CBG's, he reported that the last time Leslie [name] checked it , it was 105.  I instructed patient to not take any medication or Insulin in am. Mr Leslie [name] reports that his arrival time has been changed 3 times and someone has to notify [place] transportation.  I called and notified Leslie [name] of the new arrival time of 7:00 am, I also called the OR desk and informed them that patient has a transportation compamy bring him and if possible to not move him again.

## 2018-02-19 ENCOUNTER — Inpatient Hospital Stay (HOSPITAL_COMMUNITY): Payer: Medicare (Managed Care) | Admitting: Anesthesiology

## 2018-02-19 ENCOUNTER — Encounter (HOSPITAL_COMMUNITY)
Admission: RE | Disposition: A | Discharge status: Skilled Nursing Facility | Payer: Self-pay | Source: Ambulatory Visit | Attending: Orthopedic Surgery

## 2018-02-19 ENCOUNTER — Inpatient Hospital Stay (HOSPITAL_COMMUNITY)
Admission: RE | Admit: 2018-02-19 | Discharge: 2018-02-22 | DRG: 240 | Disposition: A | Discharge status: Skilled Nursing Facility | Payer: Medicare (Managed Care) | Source: Ambulatory Visit | Attending: Orthopedic Surgery | Admitting: Orthopedic Surgery

## 2018-02-19 DIAGNOSIS — M1990 Unspecified osteoarthritis, unspecified site: Secondary | ICD-10-CM | POA: Diagnosis present

## 2018-02-19 DIAGNOSIS — M109 Gout, unspecified: Secondary | ICD-10-CM | POA: Diagnosis not present

## 2018-02-19 DIAGNOSIS — I96 Gangrene, not elsewhere classified: Secondary | ICD-10-CM

## 2018-02-19 DIAGNOSIS — J449 Chronic obstructive pulmonary disease, unspecified: Secondary | ICD-10-CM | POA: Diagnosis present

## 2018-02-19 DIAGNOSIS — F419 Anxiety disorder, unspecified: Secondary | ICD-10-CM | POA: Diagnosis present

## 2018-02-19 DIAGNOSIS — M545 Low back pain: Secondary | ICD-10-CM | POA: Diagnosis present

## 2018-02-19 DIAGNOSIS — G8929 Other chronic pain: Secondary | ICD-10-CM | POA: Diagnosis not present

## 2018-02-19 DIAGNOSIS — Z89431 Acquired absence of right foot: Secondary | ICD-10-CM | POA: Diagnosis present

## 2018-02-19 DIAGNOSIS — E1169 Type 2 diabetes mellitus with other specified complication: Secondary | ICD-10-CM | POA: Diagnosis present

## 2018-02-19 DIAGNOSIS — Z89422 Acquired absence of other left toe(s): Secondary | ICD-10-CM

## 2018-02-19 DIAGNOSIS — E1142 Type 2 diabetes mellitus with diabetic polyneuropathy: Secondary | ICD-10-CM | POA: Diagnosis present

## 2018-02-19 DIAGNOSIS — F17220 Nicotine dependence, chewing tobacco, uncomplicated: Secondary | ICD-10-CM | POA: Diagnosis present

## 2018-02-19 DIAGNOSIS — Z888 Allergy status to other drugs, medicaments and biological substances status: Secondary | ICD-10-CM | POA: Diagnosis not present

## 2018-02-19 DIAGNOSIS — M869 Osteomyelitis, unspecified: Secondary | ICD-10-CM | POA: Diagnosis present

## 2018-02-19 DIAGNOSIS — E1165 Type 2 diabetes mellitus with hyperglycemia: Secondary | ICD-10-CM | POA: Diagnosis present

## 2018-02-19 DIAGNOSIS — E785 Hyperlipidemia, unspecified: Secondary | ICD-10-CM | POA: Diagnosis present

## 2018-02-19 DIAGNOSIS — I10 Essential (primary) hypertension: Secondary | ICD-10-CM | POA: Diagnosis present

## 2018-02-19 DIAGNOSIS — Z833 Family history of diabetes mellitus: Secondary | ICD-10-CM

## 2018-02-19 DIAGNOSIS — Z8249 Family history of ischemic heart disease and other diseases of the circulatory system: Secondary | ICD-10-CM

## 2018-02-19 DIAGNOSIS — Z89421 Acquired absence of other right toe(s): Secondary | ICD-10-CM | POA: Diagnosis not present

## 2018-02-19 DIAGNOSIS — K219 Gastro-esophageal reflux disease without esophagitis: Secondary | ICD-10-CM | POA: Diagnosis present

## 2018-02-19 DIAGNOSIS — Y835 Amputation of limb(s) as the cause of abnormal reaction of the patient, or of later complication, without mention of misadventure at the time of the procedure: Secondary | ICD-10-CM | POA: Diagnosis present

## 2018-02-19 DIAGNOSIS — E1152 Type 2 diabetes mellitus with diabetic peripheral angiopathy with gangrene: Principal | ICD-10-CM | POA: Diagnosis present

## 2018-02-19 DIAGNOSIS — T8781 Dehiscence of amputation stump: Secondary | ICD-10-CM | POA: Diagnosis present

## 2018-02-19 HISTORY — PX: AMPUTATION: SHX166

## 2018-02-19 LAB — GLUCOSE, CAPILLARY
GLUCOSE-CAPILLARY: 123 mg/dL — AB (ref 65–99)
GLUCOSE-CAPILLARY: 99 mg/dL (ref 65–99)
GLUCOSE-CAPILLARY: 101 mg/dL — AB (ref 65–99)
GLUCOSE-CAPILLARY: 214 mg/dL — AB (ref 65–99)
GLUCOSE-CAPILLARY: 180 mg/dL — AB (ref 65–99)
GLUCOSE-CAPILLARY: 153 mg/dL — AB (ref 65–99)

## 2018-02-19 LAB — COMPREHENSIVE METABOLIC PANEL
ALT: 32 U/L (ref 17–63)
ANION GAP: 12 (ref 5–15)
AST: 66 U/L — ABNORMAL HIGH (ref 15–41)
Albumin: 2.6 g/dL — ABNORMAL LOW (ref 3.5–5.0)
Alkaline Phosphatase: 92 U/L (ref 38–126)
BUN: 15 mg/dL (ref 6–20)
CHLORIDE: 101 mmol/L (ref 101–111)
CO2: 19 mmol/L — ABNORMAL LOW (ref 22–32)
Calcium: 8.1 mg/dL — ABNORMAL LOW (ref 8.9–10.3)
Creatinine, Ser: 1.02 mg/dL (ref 0.61–1.24)
Glucose, Bld: 119 mg/dL — ABNORMAL HIGH (ref 65–99)
POTASSIUM: 6 mmol/L — AB (ref 3.5–5.1)
Sodium: 132 mmol/L — ABNORMAL LOW (ref 135–145)
Total Bilirubin: 2.2 mg/dL — ABNORMAL HIGH (ref 0.3–1.2)
Total Protein: 6.9 g/dL (ref 6.5–8.1)

## 2018-02-19 LAB — CBC
HCT: 26.5 % — ABNORMAL LOW (ref 39.0–52.0)
Hemoglobin: 8.6 g/dL — ABNORMAL LOW (ref 13.0–17.0)
MCH: 27.5 pg (ref 26.0–34.0)
MCHC: 32.5 g/dL (ref 30.0–36.0)
MCV: 84.7 fL (ref 78.0–100.0)
PLATELETS: 282 10*3/uL (ref 150–400)
RBC: 3.13 MIL/uL — ABNORMAL LOW (ref 4.22–5.81)
RDW: 14.7 % (ref 11.5–15.5)
WBC: 11.8 10*3/uL — ABNORMAL HIGH (ref 4.0–10.5)

## 2018-02-19 LAB — HEMOGLOBIN A1C
HEMOGLOBIN A1C: 7.1 % — AB (ref 4.8–5.6)
Mean Plasma Glucose: 157.07 mg/dL

## 2018-02-19 SURGERY — AMPUTATION, FOOT, PARTIAL
Anesthesia: Monitor Anesthesia Care | Laterality: Right

## 2018-02-19 MED ORDER — CEFAZOLIN SODIUM-DEXTROSE 1-4 GM/50ML-% IV SOLN
1.0000 g | Freq: Four times a day (QID) | INTRAVENOUS | Status: AC
  Administered 2018-02-19 – 2018-02-20 (×3): 1 g via INTRAVENOUS
  Filled 2018-02-19 (×3): qty 50

## 2018-02-19 MED ORDER — ACETAMINOPHEN 325 MG PO TABS
325.0000 mg | ORAL_TABLET | Freq: Four times a day (QID) | ORAL | Status: DC | PRN
  Administered 2018-02-20 (×2): 650 mg via ORAL
  Filled 2018-02-19 (×3): qty 2

## 2018-02-19 MED ORDER — ONDANSETRON HCL 4 MG PO TABS
4.0000 mg | ORAL_TABLET | Freq: Four times a day (QID) | ORAL | Status: DC | PRN
  Administered 2018-02-22: 4 mg via ORAL
  Filled 2018-02-19: qty 1

## 2018-02-19 MED ORDER — PROPOFOL 10 MG/ML IV BOLUS
INTRAVENOUS | Status: AC
  Filled 2018-02-19: qty 20

## 2018-02-19 MED ORDER — FENTANYL CITRATE (PF) 100 MCG/2ML IJ SOLN
INTRAMUSCULAR | Status: AC
  Filled 2018-02-19: qty 2

## 2018-02-19 MED ORDER — FENTANYL CITRATE (PF) 250 MCG/5ML IJ SOLN
INTRAMUSCULAR | Status: AC
  Filled 2018-02-19: qty 5

## 2018-02-19 MED ORDER — FENTANYL CITRATE (PF) 100 MCG/2ML IJ SOLN
50.0000 ug | Freq: Once | INTRAMUSCULAR | Status: AC
  Administered 2018-02-19: 50 ug via INTRAVENOUS

## 2018-02-19 MED ORDER — METOCLOPRAMIDE HCL 5 MG PO TABS: 5.0000 mg | ORAL_TABLET | Freq: Three times a day (TID) | ORAL | Status: DC | PRN

## 2018-02-19 MED ORDER — MORPHINE SULFATE (PF) 2 MG/ML IV SOLN: 0.5000 mg | INTRAVENOUS | Status: DC | PRN

## 2018-02-19 MED ORDER — ASPIRIN EC 325 MG PO TBEC
325.0000 mg | DELAYED_RELEASE_TABLET | Freq: Every day | ORAL | Status: DC
  Administered 2018-02-19 – 2018-02-22 (×4): 325 mg via ORAL
  Filled 2018-02-19 (×4): qty 1

## 2018-02-19 MED ORDER — ONDANSETRON HCL 4 MG/2ML IJ SOLN: 4.0000 mg | Freq: Four times a day (QID) | INTRAMUSCULAR | Status: DC | PRN

## 2018-02-19 MED ORDER — MIDAZOLAM HCL 2 MG/2ML IJ SOLN
1.0000 mg | Freq: Once | INTRAMUSCULAR | Status: AC
  Administered 2018-02-19: 1 mg via INTRAVENOUS

## 2018-02-19 MED ORDER — DOCUSATE SODIUM 100 MG PO CAPS
100.0000 mg | ORAL_CAPSULE | Freq: Two times a day (BID) | ORAL | Status: DC
  Administered 2018-02-19 – 2018-02-22 (×6): 100 mg via ORAL
  Filled 2018-02-19 (×6): qty 1

## 2018-02-19 MED ORDER — METHOCARBAMOL 500 MG PO TABS
500.0000 mg | ORAL_TABLET | Freq: Four times a day (QID) | ORAL | Status: DC | PRN
  Administered 2018-02-19 – 2018-02-22 (×5): 500 mg via ORAL
  Filled 2018-02-19 (×5): qty 1

## 2018-02-19 MED ORDER — HYDROMORPHONE HCL 2 MG/ML IJ SOLN: 0.2500 mg | INTRAMUSCULAR | Status: DC | PRN

## 2018-02-19 MED ORDER — 0.9 % SODIUM CHLORIDE (POUR BTL) OPTIME
TOPICAL | Status: DC | PRN
  Administered 2018-02-19: 1000 mL

## 2018-02-19 MED ORDER — METHOCARBAMOL 1000 MG/10ML IJ SOLN: 500.0000 mg | Freq: Four times a day (QID) | INTRAVENOUS | Status: DC | PRN

## 2018-02-19 MED ORDER — INSULIN GLARGINE 100 UNIT/ML ~~LOC~~ SOLN
20.0000 [IU] | Freq: Every day | SUBCUTANEOUS | Status: DC
  Administered 2018-02-19 – 2018-02-21 (×3): 20 [IU] via SUBCUTANEOUS
  Filled 2018-02-19 (×3): qty 0.2

## 2018-02-19 MED ORDER — HYDROCODONE-ACETAMINOPHEN 5-325 MG PO TABS
1.0000 | ORAL_TABLET | ORAL | Status: DC | PRN
  Administered 2018-02-19 – 2018-02-21 (×5): 2 via ORAL
  Administered 2018-02-21: 1 via ORAL
  Filled 2018-02-19 (×6): qty 2

## 2018-02-19 MED ORDER — INSULIN ASPART 100 UNIT/ML ~~LOC~~ SOLN
0.0000 [IU] | Freq: Three times a day (TID) | SUBCUTANEOUS | Status: DC
  Administered 2018-02-19 – 2018-02-21 (×5): 5 [IU] via SUBCUTANEOUS
  Administered 2018-02-22: 8 [IU] via SUBCUTANEOUS
  Administered 2018-02-22: 3 [IU] via SUBCUTANEOUS

## 2018-02-19 MED ORDER — OXYCODONE HCL 5 MG PO TABS: 5.0000 mg | ORAL_TABLET | Freq: Once | ORAL | Status: DC | PRN

## 2018-02-19 MED ORDER — CEFAZOLIN SODIUM-DEXTROSE 2-4 GM/100ML-% IV SOLN
2.0000 g | INTRAVENOUS | Status: AC
  Administered 2018-02-19: 2 g via INTRAVENOUS

## 2018-02-19 MED ORDER — DEXAMETHASONE SODIUM PHOSPHATE 10 MG/ML IJ SOLN
INTRAMUSCULAR | Status: AC
  Filled 2018-02-19: qty 1

## 2018-02-19 MED ORDER — ONDANSETRON HCL 4 MG/2ML IJ SOLN
INTRAMUSCULAR | Status: AC
  Filled 2018-02-19: qty 2

## 2018-02-19 MED ORDER — METOCLOPRAMIDE HCL 5 MG/ML IJ SOLN: 5.0000 mg | Freq: Three times a day (TID) | INTRAMUSCULAR | Status: DC | PRN

## 2018-02-19 MED ORDER — HYDROCODONE-ACETAMINOPHEN 7.5-325 MG PO TABS
1.0000 | ORAL_TABLET | ORAL | Status: DC | PRN
  Administered 2018-02-21 – 2018-02-22 (×4): 2 via ORAL
  Filled 2018-02-19 (×4): qty 2

## 2018-02-19 MED ORDER — METOPROLOL TARTRATE 100 MG PO TABS
200.0000 mg | ORAL_TABLET | Freq: Once | ORAL | Status: AC
  Administered 2018-02-19: 200 mg via ORAL
  Filled 2018-02-19: qty 4

## 2018-02-19 MED ORDER — INSULIN ASPART 100 UNIT/ML ~~LOC~~ SOLN
4.0000 [IU] | Freq: Three times a day (TID) | SUBCUTANEOUS | Status: DC
  Administered 2018-02-19 – 2018-02-22 (×6): 4 [IU] via SUBCUTANEOUS

## 2018-02-19 MED ORDER — CEFAZOLIN SODIUM-DEXTROSE 2-4 GM/100ML-% IV SOLN
INTRAVENOUS | Status: AC
  Filled 2018-02-19: qty 100

## 2018-02-19 MED ORDER — MAGNESIUM CITRATE PO SOLN: 1.0000 | Freq: Once | ORAL | Status: DC | PRN

## 2018-02-19 MED ORDER — LACTATED RINGERS IV SOLN
INTRAVENOUS | Status: DC
  Administered 2018-02-19: 10 mL/h via INTRAVENOUS
  Administered 2018-02-19: 09:00:00 via INTRAVENOUS

## 2018-02-19 MED ORDER — SODIUM CHLORIDE 0.9 % IV SOLN
INTRAVENOUS | Status: DC
  Administered 2018-02-19: 15:00:00 via INTRAVENOUS

## 2018-02-19 MED ORDER — BISACODYL 10 MG RE SUPP: 10.0000 mg | Freq: Every day | RECTAL | Status: DC | PRN

## 2018-02-19 MED ORDER — CHLORHEXIDINE GLUCONATE 4 % EX LIQD: 60.0000 mL | Freq: Once | CUTANEOUS | Status: DC

## 2018-02-19 MED ORDER — ROPIVACAINE HCL 5 MG/ML IJ SOLN
INTRAMUSCULAR | Status: DC | PRN
  Administered 2018-02-19: 30 mL via PERINEURAL

## 2018-02-19 MED ORDER — MIDAZOLAM HCL 2 MG/2ML IJ SOLN
INTRAMUSCULAR | Status: AC
  Administered 2018-02-19: 1 mg via INTRAVENOUS
  Filled 2018-02-19: qty 2

## 2018-02-19 MED ORDER — OXYCODONE HCL 5 MG/5ML PO SOLN: 5.0000 mg | Freq: Once | ORAL | Status: DC | PRN

## 2018-02-19 MED ORDER — PROPOFOL 500 MG/50ML IV EMUL
INTRAVENOUS | Status: DC | PRN
  Administered 2018-02-19: 25 ug/kg/min via INTRAVENOUS

## 2018-02-19 MED ORDER — PROMETHAZINE HCL 25 MG/ML IJ SOLN: 6.2500 mg | INTRAMUSCULAR | Status: DC | PRN

## 2018-02-19 MED ORDER — POLYETHYLENE GLYCOL 3350 17 G PO PACK: 17.0000 g | PACK | Freq: Every day | ORAL | Status: DC | PRN

## 2018-02-19 SURGICAL SUPPLY — 39 items
APL SKNCLS STERI-STRIP NONHPOA (GAUZE/BANDAGES/DRESSINGS) ×1
BENZOIN TINCTURE PRP APPL 2/3 (GAUZE/BANDAGES/DRESSINGS) ×3 IMPLANT
BLADE SAW SGTL HD 18.5X60.5X1. (BLADE) ×3 IMPLANT
BLADE SURG 21 STRL SS (BLADE) ×3 IMPLANT
BNDG COHESIVE 4X5 TAN STRL (GAUZE/BANDAGES/DRESSINGS) IMPLANT
BNDG GAUZE ELAST 4 BULKY (GAUZE/BANDAGES/DRESSINGS) IMPLANT
COVER SURGICAL LIGHT HANDLE (MISCELLANEOUS) ×3 IMPLANT
DRAPE INCISE IOBAN 66X45 STRL (DRAPES) ×3 IMPLANT
DRAPE INCISE IOBAN 85X60 (DRAPES) ×3 IMPLANT
DRAPE U-SHAPE 47X51 STRL (DRAPES) ×3 IMPLANT
DRESSING PREVENA PLUS CUSTOM (GAUZE/BANDAGES/DRESSINGS) ×1 IMPLANT
DRSG ADAPTIC 3X8 NADH LF (GAUZE/BANDAGES/DRESSINGS) IMPLANT
DRSG PAD ABDOMINAL 8X10 ST (GAUZE/BANDAGES/DRESSINGS) IMPLANT
DRSG PREVENA PLUS CUSTOM (GAUZE/BANDAGES/DRESSINGS) ×3
DURAPREP 26ML APPLICATOR (WOUND CARE) ×3 IMPLANT
ELECT REM PT RETURN 9FT ADLT (ELECTROSURGICAL) ×3
ELECTRODE REM PT RTRN 9FT ADLT (ELECTROSURGICAL) ×1 IMPLANT
GAUZE SPONGE 4X4 12PLY STRL (GAUZE/BANDAGES/DRESSINGS) IMPLANT
GLOVE BIOGEL PI IND STRL 9 (GLOVE) ×1 IMPLANT
GLOVE BIOGEL PI INDICATOR 9 (GLOVE) ×2
GLOVE SURG ORTHO 9.0 STRL STRW (GLOVE) ×3 IMPLANT
GOWN STRL REUS W/ TWL XL LVL3 (GOWN DISPOSABLE) ×3 IMPLANT
GOWN STRL REUS W/TWL XL LVL3 (GOWN DISPOSABLE) ×9
KIT BASIN OR (CUSTOM PROCEDURE TRAY) ×3 IMPLANT
KIT PREVENA INCISION MGT 13 (CANNISTER) ×3 IMPLANT
KIT TURNOVER KIT B (KITS) ×3 IMPLANT
NS IRRIG 1000ML POUR BTL (IV SOLUTION) ×3 IMPLANT
PACK ORTHO EXTREMITY (CUSTOM PROCEDURE TRAY) ×3 IMPLANT
PAD ARMBOARD 7.5X6 YLW CONV (MISCELLANEOUS) ×6 IMPLANT
SPONGE LAP 18X18 X RAY DECT (DISPOSABLE) IMPLANT
SUT ETHILON 2 0 PSLX (SUTURE) ×6 IMPLANT
SUT VIC AB 2-0 CTB1 (SUTURE) IMPLANT
TOWEL OR 17X24 6PK STRL BLUE (TOWEL DISPOSABLE) ×3 IMPLANT
TOWEL OR 17X26 10 PK STRL BLUE (TOWEL DISPOSABLE) ×3 IMPLANT
TUBE CONNECTING 12'X1/4 (SUCTIONS) ×1
TUBE CONNECTING 12X1/4 (SUCTIONS) ×2 IMPLANT
WATER STERILE IRR 1000ML POUR (IV SOLUTION) ×3 IMPLANT
WND VAC CANISTER 500ML (MISCELLANEOUS) ×3 IMPLANT
YANKAUER SUCT BULB TIP NO VENT (SUCTIONS) ×3 IMPLANT

## 2018-02-19 NOTE — Plan of Care (Signed)

## 2018-02-19 NOTE — Progress Notes (Signed)
Orthopedic Tech Progress Note Patient Details:  Leslie [name] 1946/04/27 [id number]  Ortho Devices Type of Ortho Device: Postop shoe/boot   Post Interventions Patient Tolerated: Refused intervention   Leslie [name] 02/19/2018, 5:49 PM

## 2018-02-19 NOTE — H&P (Signed)
Leslie [name] is an 72 y.o. male.   Chief Complaint: Abscess osteomyelitis right forefoot. HPI: Patient is a 72 year old gentleman with peripheral vascular disease status post right foot third ray amputation.  Patient has had progressive dehiscence now with black gangrenous changes of the fourth toe.  Patient states he has not been obtaining wound care without washing or dressing changes.  He complains of drainage and odor and swelling and redness.  He is 3 weeks out from the third ray amputation.    Past Medical History:  Diagnosis Date  . Anxiety    Panic attack - 10  years aago- ? panic   . Asthma   . Cellulitis of left foot 2016   Healed and recurred 01/2016  . Cellulitis of right foot 06/30/2012  . Chronic lower back pain 07/01/2012  . COPD (chronic obstructive pulmonary disease) (HCC)    Uses nebulizers at home. Former smoker, current chewing tobacco. 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  Marland Kitchen GERD (gastroesophageal reflux disease)   . Gouty arthritis 07/01/2012   Not attacks in ~5 yrs (03/2016)  . Hepatitis 1967   "in jail when I got it; dr said it was pretty bad; quaranteened X 30d"  . Hyperlipidemia   . Hypertension   . Lung nodule, solitary 07/2015   Due for 1-yr recheck in 07/2017  . Peripheral arterial disease ([place])    critical limb ischemia  . Peripheral neuropathy 07/01/2012  . Seasonal allergies   . Shortness of breath dyspnea   . Skin growth 07/01/2012   right side of nares; "been on there 20 years"  . Substance abuse ([place])   . Type II diabetes mellitus ([place]) 2002   Diagnosed in 2002    Past Surgical History:  Procedure Laterality Date  . AMPUTATION Left 03/28/2016   Procedure: AMPUTATION left foot 4th and 5 th ray;  Surgeon: Newt Minion, MD;  Location: [facility];  Service: Orthopedics;  Laterality: Left;  . AMPUTATION Left 04/11/2016   Procedure: LEFT BELOW KNEE AMPUTATION;  Surgeon: Newt Minion, MD;  Location: [facility];  Service: Orthopedics;  Laterality: Left;  . AMPUTATION Right 01/22/2018    Procedure: RIGHT FOOT 3RD RAY AMPUTATION;  Surgeon: Newt Minion, MD;  Location: [facility];  Service: Orthopedics;  Laterality: Right;  . LEG AMPUTATION BELOW KNEE Left 04/11/2016  . LOWER EXTREMITY ANGIOGRAM N/A 04/18/2013   Procedure: LOWER EXTREMITY ANGIOGRAM;  Surgeon: Sherren Mocha, MD;  Location: [facility] CATH LAB;  Service: Cardiovascular;  Laterality: N/A;  . LOWER EXTREMITY ANGIOGRAM  08/13/2015   bilateral iliac   . PERIPHERAL VASCULAR CATHETERIZATION Bilateral 08/13/2015   Procedure: Lower Extremity Angiography;  Surgeon: Lorretta Harp, MD;  Location: [facility] CV LAB;  Service: Cardiovascular;  Laterality: Bilateral;  . PERIPHERAL VASCULAR CATHETERIZATION N/A 08/13/2015   Procedure: Abdominal Aortogram;  Surgeon: Lorretta Harp, MD;  Location: [facility] CV LAB;  Service: Cardiovascular;  Laterality: N/A;  . TOE AMPUTATION Right 01/22/2018   3RD TOE RIGHT FOOT    Family History  Problem Relation Age of Onset  . Diabetes Sister   . Hypertension Mother    Social History:  reports that he quit smoking about 16 years ago. His smoking use included cigarettes. He has a 80.00 pack-year smoking history. His smokeless tobacco use includes chew. He reports that he drank alcohol. He reports that he does not use drugs.  Allergies:  Allergies  Allergen Reactions  . Flexeril [Cyclobenzaprine] Other (See Comments)    Dry mouth and hallucinations.  No medications prior to admission.    No results found for this or any previous visit (from the past 48 hour(s)). No results found.  Review of Systems  All other systems reviewed and are negative.   There were no vitals taken for this visit. Physical Exam  Patient is alert, oriented, no adenopathy, well-dressed, normal affect, normal respiratory effort. Examination patient relates in a wheelchair.  There is dehiscence of the right knee amputation that is black gangrenous changes of the fourth toe.  A Doppler was used and patient  has a strong biphasic dorsalis pedis and posterior tibial pulse.  The pulses are palpable but he does have swelling.  His problem seems to be more microcirculatory with good pulses down to the ankle.   Assessment/Plan 1. Dehiscence of amputation stump (HCC)   2. Gangrene of toe of right foot ([place])     Plan: Discussed with patient with his progressive gangrenous changes of the forefoot his only option for foot salvage would be a transmetatarsal amputation.  Discussed that this has a high risk of not healing approximately 50%.  Discussed that his best option would be to proceed with a transtibial amputation like he has on the left.  Patient states he is not ready to consider a transtibial amputation and would like to proceed with a transmetatarsal amputation.     Newt Minion, MD 02/19/2018, 6:32 AM

## 2018-02-19 NOTE — Transfer of Care (Signed)
Immediate Anesthesia Transfer of Care Note  Patient: Leslie [name]  Procedure(s) Performed: RIGHT TRANSMETATARSAL AMPUTATION (Right )  Patient Location: PACU  Anesthesia Type:MAC and Regional  Level of Consciousness: awake, alert  and oriented  Airway & Oxygen Therapy: Patient Spontanous Breathing and Patient connected to nasal cannula oxygen  Post-op Assessment: Report given to RN, Post -op Vital signs reviewed and stable and Patient moving all extremities  Post vital signs: Reviewed and stable  Last Vitals:  Vitals Value Taken Time  BP    Temp    Pulse 71 02/19/2018 10:00 AM  Resp    SpO2 98 % 02/19/2018 10:00 AM  Vitals shown include unvalidated device data.  Last Pain:  Vitals:   02/19/18 0743  PainSc: 5       Patients Stated Pain Goal: 3 (35/46/56 8127)  Complications: No apparent anesthesia complications

## 2018-02-19 NOTE — Anesthesia Preprocedure Evaluation (Signed)
Anesthesia Evaluation  Patient identified by MRN, date of birth, ID band Patient awake    Reviewed: Allergy & Precautions, H&P , NPO status , Patient's Chart, lab work & pertinent test results  History of Anesthesia Complications Negative for: history of anesthetic complications  Airway Mallampati: II  TM Distance: >3 FB Neck ROM: full    Dental no notable dental hx. (+) Edentulous Upper, Edentulous Lower   Pulmonary asthma , COPD, former smoker,    Pulmonary exam normal breath sounds clear to auscultation       Cardiovascular hypertension, Pt. on medications Normal cardiovascular exam Rhythm:regular Rate:Normal     Neuro/Psych negative neurological ROS  negative psych ROS   GI/Hepatic Neg liver ROS, GERD  ,  Endo/Other  diabetes, Poorly Controlled  Renal/GU negative Renal ROS  negative genitourinary   Musculoskeletal negative musculoskeletal ROS (+) Arthritis ,   Abdominal Normal abdominal exam  (+)   Peds negative pediatric ROS (+)  Hematology negative hematology ROS (+)   Anesthesia Other Findings   Reproductive/Obstetrics negative OB ROS                             Anesthesia Physical  Anesthesia Plan  ASA: III  Anesthesia Plan: MAC   Post-op Pain Management:  Regional for Post-op pain   Induction: Intravenous  PONV Risk Score and Plan: 1 and Ondansetron, Midazolam and Treatment may vary due to age or medical condition  Airway Management Planned: Simple Face Mask  Additional Equipment:   Intra-op Plan:   Post-operative Plan:   Informed Consent: I have reviewed the patients History and Physical, chart, labs and discussed the procedure including the risks, benefits and alternatives for the proposed anesthesia with the patient or authorized representative who has indicated his/her understanding and acceptance.   Dental Advisory Given  Plan Discussed with:  Anesthesiologist, CRNA and Surgeon  Anesthesia Plan Comments:         Anesthesia Quick Evaluation

## 2018-02-19 NOTE — Evaluation (Signed)
Physical Therapy Evaluation Patient Details Name: Leslie [name] MRN: [id number] DOB: [birth year] Today's Date: 02/19/2018   History of Present Illness  Patient is a 72 year old gentleman with peripheral vascular disease status post right foot third ray amputation.  Patient has had progressive dehiscence now with black gangrenous changes of the fourth toe.  Patient states he has not been obtaining wound care without washing or dressing changes.  He complains of drainage and odor and swelling and redness.  He is 3 weeks out from the third ray amputation.`    Clinical Impression  Pt admitted with above diagnosis. Pt currently with functional limitations due to the deficits listed below (see PT Problem List). PTA, pt living at home, with hx of frequent falls. Utilizing  prosthetic leg for transfers and moblizng with w/c.Upon eval pt presents as high fall risk, requiring mod A to stabilize transfers from bed to chair. Unable to ambulate today in SLS and RW.  Pt will benefit from skilled PT to increase their independence and safety with mobility to allow discharge to the venue listed below.       Follow Up Recommendations SNF;Supervision/Assistance - 24 hour    Equipment Recommendations  None recommended by PT    Recommendations for Other Services       Precautions / Restrictions Precautions Precautions: Fall Restrictions Weight Bearing Restrictions: Yes RLE Weight Bearing: Non weight bearing      Mobility  Bed Mobility Overal bed mobility: Modified Independent                Transfers Overall transfer level: Needs assistance Equipment used: Rolling walker (2 wheeled);None Transfers: Sit to/from [employer] Sit to Stand: Mod assist   Squat pivot transfers: Mod assist     General transfer comment: Mod A for safe transfers into bedside chair, patient with prosethic LLE, able to adhere to NWB.   Ambulation/Gait             General Gait Details:  unable  Stairs            Wheelchair Mobility    Modified Rankin (Stroke Patients Only)       Balance Overall balance assessment: History of Falls                                           Pertinent Vitals/Pain Pain Assessment: Faces Faces Pain Scale: Hurts little more Pain Location: R foot Pain Descriptors / Indicators: Discomfort Pain Intervention(s): Limited activity within patient's tolerance;Monitored during session;Premedicated before session;Repositioned    Home Living Family/patient expects to be discharged to:: Skilled nursing facility Living Arrangements: Alone                    Prior Function Level of Independence: Needs assistance         Comments: patient living alone with support from aide and gf, reports aroudn 25 falls in last year.     Hand Dominance        Extremity/Trunk Assessment   Upper Extremity Assessment Upper Extremity Assessment: Overall WFL for tasks assessed    Lower Extremity Assessment Lower Extremity Assessment: Generalized weakness       Communication   Communication: No difficulties  Cognition Arousal/Alertness: Awake/alert Behavior During Therapy: WFL for tasks assessed/performed Overall Cognitive Status: Within Functional Limits for tasks assessed  General Comments      Exercises     Assessment/Plan    PT Assessment Patient needs continued PT services  PT Problem List Decreased strength;Decreased range of motion;Decreased balance;Decreased activity tolerance;Decreased coordination;Decreased mobility       PT Treatment Interventions DME instruction;Gait training;Stair training;Functional mobility training;Therapeutic activities;Therapeutic exercise    PT Goals (Current goals can be found in the Care Plan section)  Acute Rehab PT Goals Patient Stated Goal: go to [facility] for rehab PT Goal Formulation: With patient Time  For Goal Achievement: 02/26/18 Potential to Achieve Goals: Good    Frequency Min 3X/week   Barriers to discharge Decreased caregiver support lives alone    Co-evaluation               AM-PAC PT "6 Clicks" Daily Activity  Outcome Measure Difficulty turning over in bed (including adjusting bedclothes, sheets and blankets)?: A Little Difficulty moving from lying on back to sitting on the side of the bed? : A Little Difficulty sitting down on and standing up from a chair with arms (e.g., wheelchair, bedside commode, etc,.)?: A Little Help needed moving to and from a bed to chair (including a wheelchair)?: A Lot Help needed walking in hospital room?: A Lot Help needed climbing 3-5 steps with a railing? : Total 6 Click Score: 14    End of Session Equipment Utilized During Treatment: Gait belt Activity Tolerance: Patient tolerated treatment well Patient left: in chair;with call bell/phone within reach Nurse Communication: Mobility status PT Visit Diagnosis: Unsteadiness on feet (R26.81)    Time: 1500-1530 PT Time Calculation (min) (ACUTE ONLY): 30 min   Charges:   PT Evaluation $PT Eval Moderate Complexity: 1 Mod PT Treatments $Therapeutic Activity: 8-22 mins   PT G Codes:        Reinaldo Berber, PT, DPT Acute Rehab Services Pager: [phone]    Reinaldo Berber 02/19/2018, 4:16 PM

## 2018-02-19 NOTE — Progress Notes (Signed)
Patient is very red in sacral area.  Skin looks like it is broke in some areas.

## 2018-02-19 NOTE — Progress Notes (Signed)
Patient is very red in sacral area.  Skin looks like it is broke in some areas.  Patient was soiled, site cleaned with warm soapy water, dried and sacral dressing applied.  (patient did have some dry skin flake off and tiny amount of blood.)

## 2018-02-19 NOTE — Op Note (Signed)
     Date of Surgery: 02/19/2018  INDICATIONS: Mr. Mahler is a 72 y.o.-year-old male who has dehiscence of ray amputation right foot.  PREOPERATIVE DIAGNOSIS: gangrene right forefoot  POSTOPERATIVE DIAGNOSIS: Same.  PROCEDURE: Transmetatarsal amputation Application of Prevena wound VAC  SURGEON: Sharol Given, M.D.  ANESTHESIA:  general  IV FLUIDS AND URINE: See anesthesia.  ESTIMATED BLOOD LOSS: min mL.  COMPLICATIONS: None.  DESCRIPTION OF PROCEDURE: The patient was brought to the operating room and underwent a general anesthetic. After adequate levels of anesthesia were obtained patient's lower extremity was prepped using DuraPrep draped into a sterile field. A timeout was called.  A fishmouth incision was made just proximal to the ulcerative nonviable tissue. This was carried sharply down to bone. A oscillating saw was used to perform a transmetatarsal amputation with a gentle cascade of the metatarsals and beveled plantarly. Electrocautery was used for hemostasis. The wound was irrigated with normal saline. The incision was closed using 2-0 nylon. A Prevena wound VAC was applied. This had a good suction fit. Patient was taken to the PACU in stable condition.   DISCHARGE PLANNING:  Antibiotic duration:24 hours  Weightbearing: NWB right  Pain medication: ordered  Dressing care/ Wound VAC:VAC for 1 week  Discharge to: SNF possibly monday  Follow-up: In the office 1 week post operative.  Meridee Score, MD [place] 10:03 AM

## 2018-02-19 NOTE — Anesthesia Postprocedure Evaluation (Signed)
Anesthesia Post Note  Patient: Leslie [name]  Procedure(s) Performed: RIGHT TRANSMETATARSAL AMPUTATION (Right )     Patient location during evaluation: PACU Anesthesia Type: MAC Level of consciousness: awake and alert Pain management: pain level controlled Vital Signs Assessment: post-procedure vital signs reviewed and stable Respiratory status: spontaneous breathing, nonlabored ventilation and respiratory function stable Cardiovascular status: stable and blood pressure returned to baseline Postop Assessment: no apparent nausea or vomiting Anesthetic complications: no    Last Vitals:  Vitals:   02/19/18 1100 02/19/18 1115  BP: (!) 149/59 (!) 157/50  Pulse: 70 69  Resp: (!) 21 (!) 26  Temp:    SpO2: 95% 96%    Last Pain:  Vitals:   02/19/18 1115  PainSc: 0-No pain                 Lynda Rainwater

## 2018-02-19 NOTE — Anesthesia Procedure Notes (Signed)
Procedure Name: MAC Date/Time: 02/19/2018 9:33 AM Performed by: Izora Gala, CRNA Pre-anesthesia Checklist: Patient identified, Emergency Drugs available, Suction available and Patient being monitored Patient Re-evaluated:Patient Re-evaluated prior to induction Oxygen Delivery Method: Simple face mask Induction Type: IV induction Placement Confirmation: positive ETCO2

## 2018-02-19 NOTE — Anesthesia Procedure Notes (Signed)
Anesthesia Regional Block: Popliteal block   Pre-Anesthetic Checklist: ,, timeout performed, Correct Patient, Correct Site, Correct Laterality, Correct Procedure, Correct Position, site marked, Risks and benefits discussed,  Surgical consent,  Pre-op evaluation,  At surgeon's request and post-op pain management  Laterality: Right  Prep: chloraprep       Needles:  Injection technique: Single-shot  Needle Type: Stimiplex     Needle Length: 9cm  Needle Gauge: 21     Additional Needles:   Procedures:,,,, ultrasound used (permanent image in chart),,,,  Narrative:  Start time: 02/19/2018 8:43 AM End time: 02/19/2018 8:48 AM Injection made incrementally with aspirations every 5 mL.  Performed by: Personally  Anesthesiologist: Lynda Rainwater, MD

## 2018-02-20 ENCOUNTER — Encounter (HOSPITAL_COMMUNITY): Payer: Self-pay | Admitting: Orthopedic Surgery

## 2018-02-20 LAB — GLUCOSE, CAPILLARY
GLUCOSE-CAPILLARY: 235 mg/dL — AB (ref 65–99)
GLUCOSE-CAPILLARY: 195 mg/dL — AB (ref 65–99)
Glucose-Capillary: 119 mg/dL — ABNORMAL HIGH (ref 65–99)
Glucose-Capillary: 240 mg/dL — ABNORMAL HIGH (ref 65–99)

## 2018-02-20 NOTE — Plan of Care (Signed)

## 2018-02-20 NOTE — Clinical Social Work Note (Signed)
Pt claims he is d/c to [place] today however, [facility] is full and CSW will have to get in touch with his Forensic psychologist before pt can d/c to SNF.   [place], [place]

## 2018-02-20 NOTE — Care Management Note (Signed)
Case Management Note  Patient Details  Name: Leslie [name] MRN: [id number] Date of Birth: 1945/12/30  Subjective/Objective:                 Spoke w patient, he states he though he was going to [place] on DC. CSW following for SNF options through [facility].   Action/Plan:   Expected Discharge Date:                  Expected Discharge Plan:  Skilled Nursing Facility  In-House Referral:  Clinical Social Work  Discharge planning Services  CM Consult  Post Acute Care Choice:    Choice offered to:     DME Arranged:    DME Agency:     HH Arranged:    [place] Agency:     Status of Service:  In process, will continue to follow  If discussed at Long Length of Stay Meetings, dates discussed:    Additional Comments:  Carles Collet, RN 02/20/2018, 2:14 PM

## 2018-02-21 LAB — GLUCOSE, CAPILLARY
GLUCOSE-CAPILLARY: 241 mg/dL — AB (ref 65–99)
Glucose-Capillary: 281 mg/dL — ABNORMAL HIGH (ref 65–99)
Glucose-Capillary: 249 mg/dL — ABNORMAL HIGH (ref 65–99)
Glucose-Capillary: 201 mg/dL — ABNORMAL HIGH (ref 65–99)
Glucose-Capillary: 193 mg/dL — ABNORMAL HIGH (ref 65–99)

## 2018-02-21 NOTE — Clinical Social Work Note (Addendum)
Clinical Social Work Assessment  Patient Details  Name: Leslie [name] MRN: [id number] Date of Birth: 1946/07/15  Date of referral:  02/21/18               Reason for consult:  Facility Placement                Permission sought to share information with:  Family Supports Permission granted to share information::     Name::     Manufacturing engineer::  [facility]  Relationship::  friend  Contact Information:  [phone]  Housing/Transportation Living arrangements for the past 2 months:  Single Family Home Source of Information:  Patient Patient Interpreter Needed:  None Criminal Activity/Legal Involvement Pertinent to Current Situation/Hospitalization:  No - Comment as needed Significant Relationships:  Friend Lives with:  Self Do you feel safe going back to the place where you live?  No Need for family participation in patient care:  No (Coment)  Care giving concerns:  Pt is alert and oriented.    Social Worker assessment / plan:  CSW spoke with pt at bedside. Pt states he is going to [place]. CSW explained to pt that there was no d/c in and CSW would have to get in touch with Forensic psychologist. Pt does not know who his worker is. CSW to follow up with [facility] on Monday.   Employment status:  Retired Forensic scientist:  Other (Comment Required)([facility]) PT Recommendations:  [place] / Referral to community resources:  [place]  Patient/Family's Response to care:  Pt verbalized understanding of CSW role. Pt states he is "ready to get out of here." Pt states "I'm going to [place] today."   Patient/Family's Understanding of and Emotional Response to Diagnosis, Current Treatment, and Prognosis: Pt very unhappy that he has not left the hospital yet. Pt states someone told him Friday he would d/c today to [place]. No indication that this conversation was had. Pt states he needs to get out of here. Pt very anxious. CSW to follow up  with [place] and [place].   Emotional Assessment Appearance:  Appears stated age Attitude/Demeanor/Rapport:  Complaining, Hostile Affect (typically observed):  Agitated, Anxious, Explosive Orientation:  Oriented to Self, Oriented to Place, Oriented to  Time, Oriented to Situation Alcohol / Substance use:  Not Applicable Psych involvement (Current and /or in the community):  No (Comment)  Discharge Needs  Concerns to be addressed:  Basic Needs, Care Coordination Readmission within the last 30 days:  No Current discharge risk:  Dependent with Mobility Barriers to Discharge:  Continued Medical Work up   [employer], LCSW 02/21/2018, 1:56 PM

## 2018-02-21 NOTE — NC FL2 (Addendum)
[place] LEVEL OF CARE SCREENING TOOL     IDENTIFICATION  Patient Name: Leslie [name] Birthdate: 1946/02/09 Sex: male Admission Date (Current Location): 02/19/2018  [facility] and [place] Number:  Herbalist and Address:  [facility] [place]. [facility], [place] [address], [place], [place] [address]      Provider Number: [id number]  Attending Physician Name and Address:  Newt Minion, MD  Relative Name and Phone Number:       Current Level of Care: Hospital Recommended Level of Care: [place] Prior Approval Number:    Date Approved/Denied:   PASRR Number: [id number]  Discharge Plan: SNF    Current Diagnoses: Patient Active Problem List   Diagnosis Date Noted  . History of transmetatarsal amputation of right foot ([place]) 02/19/2018  . Amputated toe of right foot ([place]) 01/22/2018  . Gangrene of right foot ([place])   . 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 Below knee amputation status ([place]) 04/11/2016  . History of tobacco abuse 03/31/2016  . Osteomyelitis of left foot ([place])   . 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 Septic arthritis of left foot ([place]) 03/27/2016  . Solitary pulmonary nodule 08/14/2015  . Precordial chest pain 08/13/2015  . Peripheral arterial disease ([place]) 04/18/2015  . 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 Right anterior knee pain 11/19/2012  . Toenail deformity 01/16/2012  . Gout   . Diabetic neuropathy ([place]) 03/31/2008  . ALCOHOLISM 03/31/2008  . Rosacea 03/31/2008  . GERD 01/31/2008  . COPD (chronic obstructive pulmonary disease) ([place]) 08/03/2007  . DM W/COMPLICATION NOS, TYPE II 05/08/2007  . OBESITY 05/08/2007  . HTN (hypertension) 05/08/2007  . 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 HISTORY OF ASBESTOS EXPOSURE 05/08/2007  . 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 HLD (hyperlipidemia) 03/22/2002    Orientation RESPIRATION BLADDER Height & Weight     Self, Time, Situation, Place  Normal Incontinent Weight:   Height:     BEHAVIORAL SYMPTOMS/MOOD NEUROLOGICAL BOWEL NUTRITION STATUS      Continent Diet(Carb modified, thin liquids)  AMBULATORY STATUS COMMUNICATION OF NEEDS Skin   Extensive Assist Verbally Wound Vac(Leg, 172mmHg)                       Personal Care Assistance Level of Assistance  Bathing, Feeding, Dressing Bathing Assistance: Maximum assistance Feeding assistance: Independent Dressing Assistance: Maximum assistance     Functional Limitations Info  Sight, Hearing, Speech Sight Info: Adequate Hearing Info: Adequate Speech Info: Adequate    SPECIAL CARE FACTORS FREQUENCY  OT (By licensed OT), PT (By licensed PT)     PT Frequency: 3x OT Frequency: 3x            Contractures Contractures Info: Not present    Additional Factors Info  Code Status, Allergies Code Status Info: Full Code Allergies Info: Flexeril (Cyclobenzaprine)           Current Medications (02/21/2018):  This is the current hospital active medication list Current Facility-Administered Medications  Medication Dose Route Frequency Provider Last Rate Last Dose  . 0.9 %  sodium chloride infusion   Intravenous Continuous Newt Minion, MD 10 mL/hr at 02/19/18 1451    . acetaminophen (TYLENOL) tablet 325-650 mg  325-650 mg Oral Q6H PRN Newt Minion, MD   650 mg at 02/20/18  2227  . aspirin EC tablet 325 mg  325 mg Oral Daily Newt Minion, MD   325 mg at 02/21/18 0820  . bisacodyl (DULCOLAX) suppository 10 mg  10 mg Rectal Daily PRN Newt Minion, MD      . docusate sodium (COLACE) capsule 100 mg  100 mg Oral BID Newt Minion, MD   100 mg at 02/21/18 8466  . HYDROcodone-acetaminophen (NORCO) 7.5-325 MG per tablet 1-2 tablet  1-2 tablet Oral Q4H PRN Newt Minion, MD      . HYDROcodone-acetaminophen (NORCO/VICODIN)  5-325 MG per tablet 1-2 tablet  1-2 tablet Oral Q4H PRN Newt Minion, MD   2 tablet at 02/21/18 1020  . insulin aspart (novoLOG) injection 0-15 Units  0-15 Units Subcutaneous TID WC Newt Minion, MD   5 Units at 02/21/18 1202  . insulin aspart (novoLOG) injection 4 Units  4 Units Subcutaneous TID WC Newt Minion, MD   4 Units at 02/21/18 1203  . insulin glargine (LANTUS) injection 20 Units  20 Units Subcutaneous QHS Newt Minion, MD   20 Units at 02/20/18 2227  . magnesium citrate solution 1 Bottle  1 Bottle Oral Once PRN Newt Minion, MD      . methocarbamol (ROBAXIN) tablet 500 mg  500 mg Oral Q6H PRN Newt Minion, MD   500 mg at 02/20/18 1654   Or  . methocarbamol (ROBAXIN) 500 mg in dextrose 5 % 50 mL IVPB  500 mg Intravenous Q6H PRN Newt Minion, MD      . metoCLOPramide (REGLAN) tablet 5-10 mg  5-10 mg Oral Q8H PRN Newt Minion, MD       Or  . metoCLOPramide (REGLAN) injection 5-10 mg  5-10 mg Intravenous Q8H PRN Newt Minion, MD      . morphine 2 MG/ML injection 0.5-1 mg  0.5-1 mg Intravenous Q2H PRN Newt Minion, MD      . ondansetron [facility]) tablet 4 mg  4 mg Oral Q6H PRN Newt Minion, MD       Or  . ondansetron [facility]) injection 4 mg  4 mg Intravenous Q6H PRN Newt Minion, MD      . polyethylene glycol (MIRALAX / GLYCOLAX) packet 17 g  17 g Oral Daily PRN Newt Minion, MD         Discharge Medications: Please see discharge summary for a list of discharge medications.  Relevant Imaging Results:  Relevant Lab Results:   Additional Information SSN: [id number]  Eileen Stanford, LCSW

## 2018-02-21 NOTE — Progress Notes (Signed)
   Subjective: 2 Days Post-Op Procedure(s) (LRB): RIGHT TRANSMETATARSAL AMPUTATION (Right) Patient reports pain as mild and moderate.    Objective: Vital signs in last 24 hours: Temp:  [97.7 F (36.5 C)-98.6 F (37 C)] 97.7 F (36.5 C) (04/28 0511) Pulse Rate:  [94-97] 97 (04/28 0511) Resp:  [17] 17 (04/28 0511) BP: (183-195)/(77-78) 183/78 (04/28 0511) SpO2:  [99 %-100 %] 99 % (04/28 0511)  Intake/Output from previous day: 04/27 0701 - 04/28 0700 In: -  Out: 1320 [Urine:1310; Drains:10] Intake/Output this shift: No intake/output data recorded.  Recent Labs    02/19/18 0733  HGB 8.6*   Recent Labs    02/19/18 0733  WBC 11.8*  RBC 3.13*  HCT 26.5*  PLT 282   Recent Labs    02/19/18 0733  NA 132*  K 6.0*  CL 101  CO2 19*  BUN 15  CREATININE 1.02  GLUCOSE 119*  CALCIUM 8.1*   No results for input(s): LABPT, INR in the last 72 hours.  VAC good seal No results found.  Assessment/Plan: 2 Days Post-Op Procedure(s) (LRB): RIGHT TRANSMETATARSAL AMPUTATION (Right) Discharge to SNF likely Monday  Marybelle Killings 02/21/2018, 12:39 PM

## 2018-02-21 NOTE — Plan of Care (Signed)
Pt able to verbalize plan for amputation including rehab. Pt states he is looking forward to rehab.

## 2018-02-21 NOTE — Clinical Social Work Note (Signed)
CSW spoke with Leslie [name] at bedside. Per Leslie [name] he spoke with [facility] management today and they will review his case in the AM. Per Leslie [name] [facility] prior to Leslie [name] coming to the hospital. CSW to follow up with [facility] in the AM. Leslie [name] very anxious and hopeful to d/c 4/29.  [place], [place]

## 2018-02-22 LAB — GLUCOSE, CAPILLARY
GLUCOSE-CAPILLARY: 176 mg/dL — AB (ref 65–99)
Glucose-Capillary: 255 mg/dL — ABNORMAL HIGH (ref 65–99)

## 2018-02-22 MED ORDER — HYDROCODONE-ACETAMINOPHEN 10-325 MG PO TABS: 1.0000 | ORAL_TABLET | ORAL | 0 refills | Status: DC | PRN

## 2018-02-22 NOTE — Clinical Social Work Placement (Signed)
   CLINICAL SOCIAL WORK PLACEMENT  NOTE  Date:  02/22/2018  Patient Details  Name: Leslie [name] MRN: [id number] Date of Birth: [birth year]  Clinical Social Work is seeking post-discharge placement for this patient at the [place] level of care (*CSW will initial, date and re-position this form in  chart as items are completed):  Yes   Patient/family provided with [place] Work Department's list of facilities offering this level of care within the geographic area requested by the patient (or if unable, by the patient's family).  Yes   Patient/family informed of their freedom to choose among providers that offer the needed level of care, that participate in Medicare, Medicaid or managed care program needed by the patient, have an available bed and are willing to accept the patient.  Yes   Patient/family informed of [facility]'s ownership interest in [facility] and  [facility], as well as of the fact that they are under no obligation to receive care at these facilities.  PASRR submitted to EDS on       PASRR number received on       Existing PASRR number confirmed on       FL2 transmitted to all facilities in geographic area requested by pt/family on       FL2 transmitted to all facilities within larger geographic area on       Patient informed that his/her managed care company has contracts with or will negotiate with certain facilities, including the following:        Yes   Patient/family informed of bed offers received.  Patient chooses bed at [place] recommends and patient chooses bed at      Patient [facility] on  .  Patient to be transferred to facility by [facility]     Patient family notified on 02/22/18 of transfer.  Name of family member notified:  pt repsonsible for self, [facility] involved     PHYSICIAN       Additional Comment:     _______________________________________________ Normajean Baxter, LCSW 02/22/2018, 10:31 AM

## 2018-02-22 NOTE — Plan of Care (Signed)
  Problem: Clinical Measurements: Goal: Ability to maintain clinical measurements within normal limits will improve Outcome: Progressing   Problem: Pain Managment: Goal: General experience of comfort will improve Outcome: Progressing   

## 2018-02-22 NOTE — Social Work (Addendum)
Clinical Social Worker facilitated patient discharge including contacting patient family and facility to confirm patient discharge plans.  Clinical information faxed to facility and family agreeable with plan.    [facility] will transport patient to [place].    RN to call [phone] to give report prior to discharge. Pt going to Room 320.  Clinical Social Worker will sign off for now as social work intervention is no longer needed. Please consult [place] again if new need arises.  Elissa Hefty, LCSW Clinical Social Worker [phone]

## 2018-02-22 NOTE — Social Work (Signed)
CSW spoke to [place], Leslie [name], [phone] and discussed pt transition to SNF[facility]. CSW awaiting SNF to confirm receipt of clinicals. In addition, [facility] may transport patient to SNF.  CSW confirmed with patient at bedside.  Elissa Hefty, LCSW Clinical Social Worker [phone]

## 2018-02-22 NOTE — Discharge Summary (Signed)
Discharge Diagnoses:  Active Problems:   Gangrene of right foot ([place])   History of transmetatarsal amputation of right foot [facility])   Surgeries: Procedure(s): RIGHT TRANSMETATARSAL AMPUTATION on 02/19/2018    Consultants:   Discharged Condition: Improved  Hospital Course: Leslie [name] is an 72 y.o. male who was admitted 02/19/2018 with a chief complaint of dehiscence right foot third ray amputation with gangrene, with a final diagnosis of Dehiscence Right Foot 3rd Ray Amputation with Gangrene.  Patient was brought to the operating room on 02/19/2018 and underwent Procedure(s): RIGHT TRANSMETATARSAL AMPUTATION.    Patient was given perioperative antibiotics:  Anti-infectives (From admission, onward)   Start     Dose/Rate Route Frequency Ordered Stop   02/19/18 1500  ceFAZolin (ANCEF) IVPB 1 g/50 mL premix     1 g 100 mL/hr over 30 Minutes Intravenous Every 6 hours 02/19/18 1240 02/20/18 0303   02/19/18 0730  ceFAZolin (ANCEF) IVPB 2g/100 mL premix     2 g 200 mL/hr over 30 Minutes Intravenous On call to O.R. 02/19/18 0719 02/19/18 0935   02/19/18 0722  ceFAZolin (ANCEF) 2-4 GM/100ML-% IVPB    Note to Pharmacy:  Leandrew Koyanagi   : cabinet override      02/19/18 0722 02/19/18 0920    .  Patient was given sequential compression devices, early ambulation, and aspirin for DVT prophylaxis.  Recent vital signs:  Patient Vitals for the past 24 hrs:  BP Temp Temp src Pulse Resp SpO2  02/22/18 0635 (!) 147/71 98.2 F (36.8 C) Oral 96 18 100 %  02/21/18 2231 (!) 176/79 98.8 F (37.1 C) Oral 88 18 100 %  02/21/18 1255 (!) 144/69 98.8 F (37.1 C) Oral (!) 102 18 100 %  .  Recent laboratory studies: No results found.  Discharge Medications:   Allergies as of 02/22/2018      Reactions   Flexeril [cyclobenzaprine] Other (See Comments)   Dry mouth and hallucinations.       Medication List    TAKE these medications   albuterol 108 (90 Base) MCG/ACT inhaler Commonly known as:   PROVENTIL HFA;VENTOLIN HFA Inhale 2 puffs into the lungs every 4 (four) hours as needed for shortness of breath.   albuterol 1.25 MG/3ML nebulizer solution Commonly known as:  ACCUNEB Take 1 ampule by nebulization every 6 (six) hours as needed for wheezing.   allopurinol 100 MG tablet Commonly known as:  ZYLOPRIM Take 100 mg by mouth daily.   amLODipine 10 MG tablet Commonly known as:  NORVASC Take 1 tablet (10 mg total) by mouth daily.   aspirin 81 MG tablet Take 81 mg by mouth daily.   BIOFREEZE 4 % Gel Generic drug:  Menthol (Topical Analgesic) Apply 1 application topically 2 (two) times daily.   carbamazepine 200 MG tablet Commonly known as:  TEGRETOL Take 200-300 mg by mouth 2 (two) times daily. Take one tablet (200mg ) in the morning and one and a half tablets (300mg ) in the evening.   cholecalciferol 1000 units tablet Commonly known as:  VITAMIN D Take 1,000 Units by mouth daily.   clonazePAM 1 MG tablet Commonly known as:  KLONOPIN Take 1 mg by mouth daily.   DULoxetine 60 MG capsule Commonly known as:  CYMBALTA Take 60 mg by mouth daily.   furosemide 20 MG tablet Commonly known as:  LASIX Take 20 mg by mouth daily.   glimepiride 4 MG tablet Commonly known as:  AMARYL Take 4 mg by mouth daily with breakfast.  hydrALAZINE 50 MG tablet Commonly known as:  APRESOLINE Take 50 mg by mouth daily.   HYDROcodone-acetaminophen 10-325 MG tablet Commonly known as:  NORCO Take 1 tablet by mouth every 4 (four) hours as needed for moderate pain. What changed:    when to take this  reasons to take this   insulin glargine 100 UNIT/ML injection Commonly known as:  LANTUS Inject 25 Units into the skin daily.   losartan 100 MG tablet Commonly known as:  COZAAR Take 100 mg by mouth daily.   metFORMIN 1000 MG tablet Commonly known as:  GLUCOPHAGE Take 1 tablet (1,000 mg total) by mouth 2 (two) times daily with a meal.   metoprolol tartrate 100 MG  tablet Commonly known as:  LOPRESSOR Take 2 tablets (200 mg total) by mouth 2 (two) times daily.   mirtazapine 7.5 MG tablet Commonly known as:  REMERON Take 7.5 mg by mouth at bedtime.   multivitamin with minerals Tabs tablet Take 1 tablet by mouth daily.   nitroGLYCERIN 0.4 MG SL tablet Commonly known as:  NITROSTAT Place 0.4 mg under the tongue every 5 (five) minutes as needed for chest pain.   nitroGLYCERIN 0.2 mg/hr patch Commonly known as:  NITRODUR - Dosed in mg/24 hr Place 1 patch (0.2 mg total) onto the skin daily.   nystatin powder Generic drug:  nystatin Apply 1 Bottle topically 4 (four) times daily.   pentoxifylline 400 MG CR tablet Commonly known as:  TRENTAL Take 1 tablet (400 mg total) by mouth 3 (three) times daily with meals.   pregabalin 150 MG capsule Commonly known as:  LYRICA Take 150 mg by mouth 3 (three) times daily.   ranitidine 300 MG capsule Commonly known as:  ZANTAC Take 300 mg by mouth every evening.   REPATHA SURECLICK 175 MG/ML So[place] Generic drug:  Evolocumab Inject 140 mg into the skin every 14 (fourteen) days.   senna 8.6 MG tablet Commonly known as:  SENOKOT Take 2 tablets by mouth 2 (two) times daily.   SILTUSSIN DAS 100 MG/5ML liquid Generic drug:  guaiFENesin Take 200 mg by mouth every 4 (four) hours.       Diagnostic Studies: No results found.  Patient benefited maximally from their hospital stay and there were no complications.     Disposition: Discharge disposition: 03-Skilled Nursing Facility      Discharge Instructions    Call MD / Call 911   Complete by:  As directed    If you experience chest pain or shortness of breath, CALL 911 and be transported to the hospital emergency room.  If you develope a fever above 101 F, pus (white drainage) or increased drainage or redness at the wound, or calf pain, call your surgeon's office.   Call MD / Call 911   Complete by:  As directed    If you experience chest pain or  shortness of breath, CALL 911 and be transported to the hospital emergency room.  If you develope a fever above 101 F, pus (white drainage) or increased drainage or redness at the wound, or calf pain, call your surgeon's office.   Constipation Prevention   Complete by:  As directed    Drink plenty of fluids.  Prune juice may be helpful.  You may use a stool softener, such as Colace (over the counter) 100 mg twice a day.  Use MiraLax (over the counter) for constipation as needed.   Constipation Prevention   Complete by:  As directed  Drink plenty of fluids.  Prune juice may be helpful.  You may use a stool softener, such as Colace (over the counter) 100 mg twice a day.  Use MiraLax (over the counter) for constipation as needed.   Diet - low sodium heart healthy   Complete by:  As directed    Diet - low sodium heart healthy   Complete by:  As directed    Increase activity slowly as tolerated   Complete by:  As directed    Increase activity slowly as tolerated   Complete by:  As directed    Negative Pressure Wound Therapy - Incisional   Complete by:  As directed    Obtained Praveena plus wound VAC pump from the operating room.  Discontinue the hospital VAC and attached to the portable Praveena plus wound VAC pump.     Follow-up Information    Newt Minion, MD Follow up in 1 week(s).   Specialty:  Orthopedic Surgery Contact information: [address] [place] [address] [phone]            Signed: Newt Minion 02/22/2018, 7:05 AM

## 2018-02-22 NOTE — Progress Notes (Signed)
AVS and printed prescription given to [facility] from [place]. This RN changed hospital wound vac to Praveena wound vac. Pt escorted off the unit via personal wheelchair by [facility] from [facility]. Report called to [place], Therapist, sports at [facility].

## 2018-02-26 ENCOUNTER — Ambulatory Visit (INDEPENDENT_AMBULATORY_CARE_PROVIDER_SITE_OTHER): Payer: Medicare (Managed Care)

## 2018-02-26 ENCOUNTER — Encounter (INDEPENDENT_AMBULATORY_CARE_PROVIDER_SITE_OTHER): Payer: Self-pay

## 2018-02-26 VITALS — Ht 64.0 in | Wt 233.0 lb

## 2018-02-26 DIAGNOSIS — I96 Gangrene, not elsewhere classified: Secondary | ICD-10-CM

## 2018-02-26 NOTE — Progress Notes (Signed)
Patient is s/p a right transmet amputation on 02/19/18 with prevena wound vac placement.  Patient is non weight bearing in a wheelchair with vac in place but not turned on. The canister is 3/4 of the way full and there is drainage the length of the tubing. The foot is macerated and swollen. Stitches are intact and the incision is pulling slightly. ABD, kerlix and ace applied. Order written for [facility] to wash the foot with dial soap and water and apply a dry dressing daily. To keep the foot elevated higher than heart and to continue to remain non weight bearing. To follow up with Dr. Sharol Given in the office next week.   Leslie [name], [place], [employer]

## 2018-03-01 ENCOUNTER — Telehealth (INDEPENDENT_AMBULATORY_CARE_PROVIDER_SITE_OTHER): Payer: Self-pay | Admitting: Orthopedic Surgery

## 2018-03-01 ENCOUNTER — Other Ambulatory Visit (INDEPENDENT_AMBULATORY_CARE_PROVIDER_SITE_OTHER): Payer: Self-pay

## 2018-03-01 NOTE — Telephone Encounter (Signed)
Order faxed as requested. These orders were sent back with the pt on the day of his appt last week. Dial soap and water to the incision daily, dry dressing change and follow up in the office with Dr. Sharol Given.

## 2018-03-01 NOTE — Telephone Encounter (Signed)
Jackie-nurse with [facility] called needing wound care orders for the patient faxed to her. The fax# is [phone]  The ph# is [phone]

## 2018-03-03 ENCOUNTER — Inpatient Hospital Stay (HOSPITAL_COMMUNITY)
Admission: EM | Admit: 2018-03-03 | Discharge: 2018-03-05 | DRG: 564 | Disposition: A | Discharge status: Skilled Nursing Facility | Payer: Medicare (Managed Care) | Attending: Internal Medicine | Admitting: Internal Medicine

## 2018-03-03 ENCOUNTER — Emergency Department (HOSPITAL_COMMUNITY): Payer: Medicare (Managed Care)

## 2018-03-03 ENCOUNTER — Encounter (HOSPITAL_COMMUNITY): Payer: Self-pay | Admitting: Internal Medicine

## 2018-03-03 DIAGNOSIS — L309 Dermatitis, unspecified: Secondary | ICD-10-CM | POA: Diagnosis present

## 2018-03-03 DIAGNOSIS — Z89421 Acquired absence of other right toe(s): Secondary | ICD-10-CM | POA: Diagnosis not present

## 2018-03-03 DIAGNOSIS — Z794 Long term (current) use of insulin: Secondary | ICD-10-CM

## 2018-03-03 DIAGNOSIS — Z720 Tobacco use: Secondary | ICD-10-CM | POA: Diagnosis not present

## 2018-03-03 DIAGNOSIS — E11628 Type 2 diabetes mellitus with other skin complications: Secondary | ICD-10-CM

## 2018-03-03 DIAGNOSIS — Z89431 Acquired absence of right foot: Secondary | ICD-10-CM | POA: Diagnosis present

## 2018-03-03 DIAGNOSIS — Z79899 Other long term (current) drug therapy: Secondary | ICD-10-CM

## 2018-03-03 DIAGNOSIS — L089 Local infection of the skin and subcutaneous tissue, unspecified: Secondary | ICD-10-CM

## 2018-03-03 DIAGNOSIS — Z79891 Long term (current) use of opiate analgesic: Secondary | ICD-10-CM | POA: Diagnosis not present

## 2018-03-03 DIAGNOSIS — M109 Gout, unspecified: Secondary | ICD-10-CM | POA: Diagnosis present

## 2018-03-03 DIAGNOSIS — G9341 Metabolic encephalopathy: Secondary | ICD-10-CM | POA: Diagnosis present

## 2018-03-03 DIAGNOSIS — E1140 Type 2 diabetes mellitus with diabetic neuropathy, unspecified: Secondary | ICD-10-CM | POA: Diagnosis present

## 2018-03-03 DIAGNOSIS — Y835 Amputation of limb(s) as the cause of abnormal reaction of the patient, or of later complication, without mention of misadventure at the time of the procedure: Secondary | ICD-10-CM | POA: Diagnosis present

## 2018-03-03 DIAGNOSIS — K219 Gastro-esophageal reflux disease without esophagitis: Secondary | ICD-10-CM | POA: Diagnosis present

## 2018-03-03 DIAGNOSIS — L03115 Cellulitis of right lower limb: Secondary | ICD-10-CM | POA: Diagnosis not present

## 2018-03-03 DIAGNOSIS — F419 Anxiety disorder, unspecified: Secondary | ICD-10-CM | POA: Diagnosis present

## 2018-03-03 DIAGNOSIS — J449 Chronic obstructive pulmonary disease, unspecified: Secondary | ICD-10-CM | POA: Diagnosis present

## 2018-03-03 DIAGNOSIS — R509 Fever, unspecified: Secondary | ICD-10-CM

## 2018-03-03 DIAGNOSIS — T8743 Infection of amputation stump, right lower extremity: Secondary | ICD-10-CM | POA: Diagnosis present

## 2018-03-03 DIAGNOSIS — I10 Essential (primary) hypertension: Secondary | ICD-10-CM | POA: Diagnosis not present

## 2018-03-03 DIAGNOSIS — Z7951 Long term (current) use of inhaled steroids: Secondary | ICD-10-CM

## 2018-03-03 DIAGNOSIS — I739 Peripheral vascular disease, unspecified: Secondary | ICD-10-CM | POA: Diagnosis present

## 2018-03-03 DIAGNOSIS — E1151 Type 2 diabetes mellitus with diabetic peripheral angiopathy without gangrene: Secondary | ICD-10-CM | POA: Diagnosis present

## 2018-03-03 DIAGNOSIS — Z89512 Acquired absence of left leg below knee: Secondary | ICD-10-CM | POA: Diagnosis not present

## 2018-03-03 DIAGNOSIS — E785 Hyperlipidemia, unspecified: Secondary | ICD-10-CM | POA: Diagnosis present

## 2018-03-03 DIAGNOSIS — Z89411 Acquired absence of right great toe: Secondary | ICD-10-CM | POA: Diagnosis not present

## 2018-03-03 DIAGNOSIS — G8929 Other chronic pain: Secondary | ICD-10-CM | POA: Diagnosis present

## 2018-03-03 DIAGNOSIS — Z7982 Long term (current) use of aspirin: Secondary | ICD-10-CM | POA: Diagnosis not present

## 2018-03-03 DIAGNOSIS — I743 Embolism and thrombosis of arteries of the lower extremities: Secondary | ICD-10-CM | POA: Diagnosis not present

## 2018-03-03 LAB — URINALYSIS, ROUTINE W REFLEX MICROSCOPIC
BACTERIA UA: NONE SEEN
Bilirubin Urine: NEGATIVE
Glucose, UA: NEGATIVE mg/dL
Hgb urine dipstick: NEGATIVE
Ketones, ur: NEGATIVE mg/dL
LEUKOCYTES UA: NEGATIVE
Nitrite: NEGATIVE
PROTEIN: 30 mg/dL — AB
Specific Gravity, Urine: 1.012 (ref 1.005–1.030)
pH: 7 (ref 5.0–8.0)

## 2018-03-03 LAB — CBC WITH DIFFERENTIAL/PLATELET
Basophils Absolute: 0 10*3/uL (ref 0.0–0.1)
Basophils Relative: 0 %
EOS ABS: 0.1 10*3/uL (ref 0.0–0.7)
Eosinophils Relative: 0 %
HCT: 27.9 % — ABNORMAL LOW (ref 39.0–52.0)
HEMOGLOBIN: 8.8 g/dL — AB (ref 13.0–17.0)
LYMPHS ABS: 1.7 10*3/uL (ref 0.7–4.0)
Lymphocytes Relative: 10 %
MCH: 27.5 pg (ref 26.0–34.0)
MCHC: 31.5 g/dL (ref 30.0–36.0)
MCV: 87.2 fL (ref 78.0–100.0)
MONOS PCT: 11 %
Monocytes Absolute: 1.9 10*3/uL — ABNORMAL HIGH (ref 0.1–1.0)
NEUTROS PCT: 79 %
Neutro Abs: 13 10*3/uL — ABNORMAL HIGH (ref 1.7–7.7)
Platelets: 249 10*3/uL (ref 150–400)
RBC: 3.2 MIL/uL — ABNORMAL LOW (ref 4.22–5.81)
RDW: 16.4 % — ABNORMAL HIGH (ref 11.5–15.5)
WBC: 16.6 10*3/uL — ABNORMAL HIGH (ref 4.0–10.5)

## 2018-03-03 LAB — COMPREHENSIVE METABOLIC PANEL
ALBUMIN: 2.9 g/dL — AB (ref 3.5–5.0)
ALK PHOS: 80 U/L (ref 38–126)
ALT: 16 U/L — ABNORMAL LOW (ref 17–63)
AST: 23 U/L (ref 15–41)
Anion gap: 10 (ref 5–15)
BUN: 12 mg/dL (ref 6–20)
CO2: 27 mmol/L (ref 22–32)
Calcium: 8.7 mg/dL — ABNORMAL LOW (ref 8.9–10.3)
Chloride: 96 mmol/L — ABNORMAL LOW (ref 101–111)
Creatinine, Ser: 1.2 mg/dL (ref 0.61–1.24)
GFR calc Af Amer: >60 mL/min (ref >60)
GFR calc non Af Amer: 59 mL/min — ABNORMAL LOW (ref >60)
GLUCOSE: 204 mg/dL — AB (ref 65–99)
POTASSIUM: 4.7 mmol/L (ref 3.5–5.1)
SODIUM: 133 mmol/L — AB (ref 135–145)
TOTAL PROTEIN: 7.4 g/dL (ref 6.5–8.1)
Total Bilirubin: 0.7 mg/dL (ref 0.3–1.2)

## 2018-03-03 LAB — PROTIME-INR
INR: 1.19
PROTHROMBIN TIME: 15 s (ref 11.4–15.2)

## 2018-03-03 LAB — I-STAT CG4 LACTIC ACID, ED
Lactic Acid, Venous: 2.36 mmol/L (ref 0.5–1.9)
Lactic Acid, Venous: 1.39 mmol/L (ref 0.5–1.9)

## 2018-03-03 LAB — CBG MONITORING, ED: GLUCOSE-CAPILLARY: 205 mg/dL — AB (ref 65–99)

## 2018-03-03 LAB — GLUCOSE, CAPILLARY: Glucose-Capillary: 172 mg/dL — ABNORMAL HIGH (ref 65–99)

## 2018-03-03 MED ORDER — CARBAMAZEPINE 200 MG PO TABS
200.0000 mg | ORAL_TABLET | Freq: Every day | ORAL | Status: DC
  Administered 2018-03-04 – 2018-03-05 (×2): 200 mg via ORAL
  Filled 2018-03-03 (×2): qty 1

## 2018-03-03 MED ORDER — SODIUM CHLORIDE 0.9 % IV SOLN
2000.0000 mg | Freq: Once | INTRAVENOUS | Status: AC
  Administered 2018-03-03: 2000 mg via INTRAVENOUS
  Filled 2018-03-03: qty 2000

## 2018-03-03 MED ORDER — PIPERACILLIN-TAZOBACTAM 3.375 G IVPB 30 MIN
3.3750 g | Freq: Once | INTRAVENOUS | Status: AC
  Administered 2018-03-03: 3.375 g via INTRAVENOUS
  Filled 2018-03-03: qty 50

## 2018-03-03 MED ORDER — FAMOTIDINE 20 MG PO TABS
40.0000 mg | ORAL_TABLET | Freq: Every day | ORAL | Status: DC
  Administered 2018-03-03 – 2018-03-04 (×2): 40 mg via ORAL
  Filled 2018-03-03 (×2): qty 2

## 2018-03-03 MED ORDER — CARBAMAZEPINE 200 MG PO TABS: 200.0000 mg | ORAL_TABLET | Freq: Every day | ORAL | Status: DC

## 2018-03-03 MED ORDER — AMLODIPINE BESYLATE 10 MG PO TABS
10.0000 mg | ORAL_TABLET | Freq: Every day | ORAL | Status: DC
  Administered 2018-03-04 – 2018-03-05 (×2): 10 mg via ORAL
  Filled 2018-03-03 (×2): qty 1

## 2018-03-03 MED ORDER — SENNOSIDES-DOCUSATE SODIUM 8.6-50 MG PO TABS: 1.0000 | ORAL_TABLET | Freq: Every evening | ORAL | Status: DC | PRN

## 2018-03-03 MED ORDER — ACETAMINOPHEN 650 MG RE SUPP: 650.0000 mg | Freq: Four times a day (QID) | RECTAL | Status: DC | PRN

## 2018-03-03 MED ORDER — HYDROCODONE-ACETAMINOPHEN 10-325 MG PO TABS
1.0000 | ORAL_TABLET | ORAL | Status: DC | PRN
  Administered 2018-03-05: 1 via ORAL
  Filled 2018-03-03: qty 1

## 2018-03-03 MED ORDER — SODIUM CHLORIDE 0.9 % IV BOLUS
500.0000 mL | Freq: Once | INTRAVENOUS | Status: AC
  Administered 2018-03-03: 500 mL via INTRAVENOUS

## 2018-03-03 MED ORDER — VANCOMYCIN HCL 10 G IV SOLR: 1250.0000 mg | INTRAVENOUS | Status: DC

## 2018-03-03 MED ORDER — ALLOPURINOL 100 MG PO TABS
100.0000 mg | ORAL_TABLET | Freq: Every day | ORAL | Status: DC
  Administered 2018-03-05: 100 mg via ORAL
  Filled 2018-03-03 (×2): qty 1

## 2018-03-03 MED ORDER — ACETAMINOPHEN 325 MG PO TABS
650.0000 mg | ORAL_TABLET | Freq: Once | ORAL | Status: AC
  Administered 2018-03-03: 650 mg via ORAL
  Filled 2018-03-03: qty 2

## 2018-03-03 MED ORDER — ENOXAPARIN SODIUM 40 MG/0.4ML ~~LOC~~ SOLN
40.0000 mg | SUBCUTANEOUS | Status: DC
  Administered 2018-03-04: 40 mg via SUBCUTANEOUS
  Filled 2018-03-03: qty 0.4

## 2018-03-03 MED ORDER — CARBAMAZEPINE 200 MG PO TABS: 200.0000 mg | ORAL_TABLET | Freq: Two times a day (BID) | ORAL | Status: DC

## 2018-03-03 MED ORDER — PREGABALIN 75 MG PO CAPS
150.0000 mg | ORAL_CAPSULE | Freq: Three times a day (TID) | ORAL | Status: DC
  Administered 2018-03-03 – 2018-03-05 (×6): 150 mg via ORAL
  Filled 2018-03-03 (×6): qty 2

## 2018-03-03 MED ORDER — ALBUTEROL SULFATE (2.5 MG/3ML) 0.083% IN NEBU: 1.5000 mL | INHALATION_SOLUTION | Freq: Four times a day (QID) | RESPIRATORY_TRACT | Status: DC | PRN

## 2018-03-03 MED ORDER — INSULIN GLARGINE 100 UNIT/ML ~~LOC~~ SOLN
10.0000 [IU] | Freq: Every day | SUBCUTANEOUS | Status: DC
  Administered 2018-03-04 (×2): 10 [IU] via SUBCUTANEOUS
  Filled 2018-03-03 (×4): qty 0.1

## 2018-03-03 MED ORDER — CLONAZEPAM 1 MG PO TABS
1.0000 mg | ORAL_TABLET | Freq: Every day | ORAL | Status: DC
  Administered 2018-03-03 – 2018-03-04 (×2): 1 mg via ORAL
  Filled 2018-03-03 (×2): qty 1

## 2018-03-03 MED ORDER — DULOXETINE HCL 60 MG PO CPEP
60.0000 mg | ORAL_CAPSULE | Freq: Every day | ORAL | Status: DC
  Administered 2018-03-04 – 2018-03-05 (×2): 60 mg via ORAL
  Filled 2018-03-03 (×3): qty 1

## 2018-03-03 MED ORDER — INSULIN ASPART 100 UNIT/ML ~~LOC~~ SOLN
0.0000 [IU] | Freq: Three times a day (TID) | SUBCUTANEOUS | Status: DC
  Administered 2018-03-04 (×3): 1 [IU] via SUBCUTANEOUS
  Administered 2018-03-05: 2 [IU] via SUBCUTANEOUS
  Administered 2018-03-05: 3 [IU] via SUBCUTANEOUS
  Administered 2018-03-05: 5 [IU] via SUBCUTANEOUS

## 2018-03-03 MED ORDER — ACETAMINOPHEN 325 MG PO TABS
650.0000 mg | ORAL_TABLET | Freq: Four times a day (QID) | ORAL | Status: DC | PRN
  Administered 2018-03-03: 650 mg via ORAL
  Filled 2018-03-03: qty 2

## 2018-03-03 MED ORDER — CARBAMAZEPINE 200 MG PO TABS
300.0000 mg | ORAL_TABLET | Freq: Every day | ORAL | Status: DC
Start: 2018-03-03 — End: 2018-03-05
  Administered 2018-03-03 – 2018-03-04 (×2): 300 mg via ORAL
  Filled 2018-03-03 (×4): qty 1.5

## 2018-03-03 NOTE — ED Notes (Signed)
Attempted to call report at [phone].  Floor requested I wait a few minutes and call back.

## 2018-03-03 NOTE — ED Provider Notes (Signed)
[facility] EMERGENCY DEPARTMENT Provider Note   CSN: [id number] Arrival date & time: 03/03/18  1523     History   Chief Complaint Chief Complaint  Patient presents with  . Altered Mental Status    HPI Leslie [name] is a 72 y.o. male.  72 year old male with prior history of COPD, DMII, HTN, hyperlipidemia, PVD, and recent right foot transmetatarsal amputation (with Dr. Sharol Given).  Patient arrives by EMS from [facility] for reported fever.  Temperature upon arrival is 103.1 rectally.  Patient's fever reportedly started today.  Patient is without specific complaint.  He appears to be mildly confused at baseline.  He denies associated chest pain, shortness of breath, nausea, vomiting, abdominal pain, or other acute complaint.  Of note, patient status post recent right transmetatarsal foot amputation on 02/19/2018.  This was performed by Dr. Sharol Given.   The history is provided by the patient and medical records.  Fever   This is a new problem. The current episode started 6 to 12 hours ago. The problem occurs rarely. The problem has not changed since onset.The maximum temperature noted was 102 to 102.9 F. Pertinent negatives include no chest pain, no vomiting and no headaches. He has tried nothing for the symptoms.    Past Medical History:  Diagnosis Date  . Anxiety    Panic attack - 10  years aago- ? panic   . Asthma   . Cellulitis of left foot 2016   Healed and recurred 01/2016  . Cellulitis of right foot 06/30/2012  . Chronic lower back pain 07/01/2012  . COPD (chronic obstructive pulmonary disease) (HCC)    Uses nebulizers at home. Former smoker, current chewing tobacco. 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  Marland Kitchen GERD (gastroesophageal reflux disease)   . Gouty arthritis 07/01/2012   Not attacks in ~5 yrs (03/2016)  . Hepatitis 1967   "in jail when I got it; dr said it was pretty bad; quaranteened X 30d"  . Hyperlipidemia   . Hypertension   . Lung nodule, solitary 07/2015   Due for  1-yr recheck in 07/2017  . Peripheral arterial disease ([place])    critical limb ischemia  . Peripheral neuropathy 07/01/2012  . Seasonal allergies   . Shortness of breath dyspnea   . Skin growth 07/01/2012   right side of nares; "been on there 20 years"  . Substance abuse ([place])   . Type II diabetes mellitus ([place]) 2002   Diagnosed in 2002    Patient Active Problem List   Diagnosis Date Noted  . History of transmetatarsal amputation of right foot ([place]) 02/19/2018  . Amputated toe of right foot ([place]) 01/22/2018  . Gangrene of right foot ([place])   . 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 Below knee amputation status ([place]) 04/11/2016  . History of tobacco abuse 03/31/2016  . Osteomyelitis of left foot ([place])   . 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 Septic arthritis of left foot ([place]) 03/27/2016  . Solitary pulmonary nodule 08/14/2015  . Precordial chest pain 08/13/2015  . Peripheral arterial disease ([place]) 04/18/2015  . 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 Right anterior knee pain 11/19/2012  . Toenail deformity 01/16/2012  . Gout   . Diabetic neuropathy ([place]) 03/31/2008  . ALCOHOLISM 03/31/2008  . Rosacea 03/31/2008  . GERD 01/31/2008  . COPD (chronic obstructive pulmonary disease) ([place]) 08/03/2007  . DM W/COMPLICATION NOS,  TYPE II 05/08/2007  . OBESITY 05/08/2007  . HTN (hypertension) 05/08/2007  . 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 HISTORY OF ASBESTOS EXPOSURE 05/08/2007  . Chronic lower back pain on narcotics  06/16/2005  . HLD (hyperlipidemia) 03/22/2002    Past Surgical History:  Procedure Laterality Date  . AMPUTATION Left 03/28/2016   Procedure: AMPUTATION left foot 4th and 5 th ray;  Surgeon: Newt Minion, MD;  Location: [facility];  Service: Orthopedics;  Laterality: Left;  . AMPUTATION Left 04/11/2016   Procedure: LEFT BELOW  KNEE AMPUTATION;  Surgeon: Newt Minion, MD;  Location: [facility];  Service: Orthopedics;  Laterality: Left;  . AMPUTATION Right 01/22/2018   Procedure: RIGHT FOOT 3RD RAY AMPUTATION;  Surgeon: Newt Minion, MD;  Location: [facility];  Service: Orthopedics;  Laterality: Right;  . AMPUTATION Right 02/19/2018   Procedure: RIGHT TRANSMETATARSAL AMPUTATION;  Surgeon: Newt Minion, MD;  Location: [facility];  Service: Orthopedics;  Laterality: Right;  . LEG AMPUTATION BELOW KNEE Left 04/11/2016  . LOWER EXTREMITY ANGIOGRAM N/A 04/18/2013   Procedure: LOWER EXTREMITY ANGIOGRAM;  Surgeon: Sherren Mocha, MD;  Location: [facility] CATH LAB;  Service: Cardiovascular;  Laterality: N/A;  . LOWER EXTREMITY ANGIOGRAM  08/13/2015   bilateral iliac   . PERIPHERAL VASCULAR CATHETERIZATION Bilateral 08/13/2015   Procedure: Lower Extremity Angiography;  Surgeon: Lorretta Harp, MD;  Location: [facility] CV LAB;  Service: Cardiovascular;  Laterality: Bilateral;  . PERIPHERAL VASCULAR CATHETERIZATION N/A 08/13/2015   Procedure: Abdominal Aortogram;  Surgeon: Lorretta Harp, MD;  Location: [facility] CV LAB;  Service: Cardiovascular;  Laterality: N/A;  . TOE AMPUTATION Right 01/22/2018   3RD TOE RIGHT FOOT        Home Medications    Prior to Admission medications   Medication Sig Start Date End Date Taking? Authorizing Provider  albuterol (ACCUNEB) 1.25 MG/3ML nebulizer solution Take 1 ampule by nebulization every 6 (six) hours as needed for wheezing.    [provider]  albuterol (PROVENTIL HFA;VENTOLIN HFA) 108 (90 Base) MCG/ACT inhaler Inhale 2 puffs into the lungs every 4 (four) hours as needed for shortness of breath.    [provider]  allopurinol (ZYLOPRIM) 100 MG tablet Take 100 mg by mouth daily.    [provider]  amLODipine (NORVASC) 10 MG tablet Take 1 tablet (10 mg total) by mouth daily. 08/09/12   Funches, Adriana Mccallum, MD  aspirin 81 MG tablet Take 81 mg by mouth daily.    [provider]  carbamazepine (TEGRETOL) 200 MG tablet Take 200-300 mg by mouth 2 (two) times daily. Take one tablet (200mg ) in the morning and one and a half tablets (300mg ) in the evening.    [provider]  cholecalciferol (VITAMIN D) 1000 UNITS tablet Take 1,000 Units by mouth daily.    [provider]  clonazePAM (KLONOPIN) 1 MG tablet Take 1 mg by mouth daily.    [provider]  DULoxetine (CYMBALTA) 60 MG capsule Take 60 mg by mouth daily.    [provider]  Evolocumab (REPATHA SURECLICK) 151 MG/ML SOAJ Inject 140 mg into the skin every 14 (fourteen) days.     [provider]  furosemide (LASIX) 20 MG tablet Take 20 mg by mouth daily.    [provider]  glimepiride (AMARYL) 4 MG tablet Take 4 mg by mouth daily with breakfast.    [provider]  guaiFENesin (SILTUSSIN DAS) 100 MG/5ML liquid Take 200 mg by mouth every 4 (four)  hours.    [provider]  hydrALAZINE (APRESOLINE) 50 MG tablet Take 50 mg by mouth daily.    [provider]  HYDROcodone-acetaminophen (NORCO) 10-325 MG tablet Take 1 tablet by mouth every 4 (four) hours as needed for moderate pain. 02/22/18   Newt Minion, MD  insulin glargine (LANTUS) 100 UNIT/ML injection Inject 25 Units into the skin daily.     [provider]  losartan (COZAAR) 100 MG tablet Take 100 mg by mouth daily.    [provider]  Menthol, Topical Analgesic, (BIOFREEZE) 4 % GEL Apply 1 application topically 2 (two) times daily.    [provider]  metFORMIN (GLUCOPHAGE) 1000 MG tablet Take 1 tablet (1,000 mg total) by mouth 2 (two) times daily with a meal. 08/16/15   Eileen Stanford, PA-C  metoprolol (LOPRESSOR) 100 MG tablet Take 2 tablets (200 mg total) by mouth 2 (two) times daily. 08/09/12   Funches, Adriana Mccallum, MD  mirtazapine (REMERON) 7.5 MG tablet Take 7.5 mg by mouth at bedtime.    [provider]  Multiple Vitamin  (MULTIVITAMIN WITH MINERALS) TABS tablet Take 1 tablet by mouth daily.    [provider]  nitroGLYCERIN (NITRODUR - DOSED IN MG/24 HR) 0.2 mg/hr patch Place 1 patch (0.2 mg total) onto the skin daily. 02/04/18   Newt Minion, MD  nitroGLYCERIN (NITROSTAT) 0.4 MG SL tablet Place 0.4 mg under the tongue every 5 (five) minutes as needed for chest pain.  11/12/11   Funches, Adriana Mccallum, MD  nystatin (NYSTATIN) powder Apply 1 Bottle topically 4 (four) times daily.    [provider]  pentoxifylline (TRENTAL) 400 MG CR tablet Take 1 tablet (400 mg total) by mouth 3 (three) times daily with meals. 02/04/18   Newt Minion, MD  pregabalin (LYRICA) 150 MG capsule Take 150 mg by mouth 3 (three) times daily.     [provider]  ranitidine (ZANTAC) 300 MG capsule Take 300 mg by mouth every evening.    [provider]  senna (SENOKOT) 8.6 MG tablet Take 2 tablets by mouth 2 (two) times daily.     [provider]    Family History Family History  Problem Relation Age of Onset  . Diabetes Sister   . Hypertension Mother     Social History Social History   Tobacco Use  . Smoking status: Former Smoker    Packs/day: 2.00    Years: 40.00    Pack years: 80.00    Types: Cigarettes    Last attempt to quit: 01/13/2002    Years since quitting: 16.1  . Smokeless tobacco: Current User    Types: Chew  . Tobacco comment: chews a little tobacco  Substance Use Topics  . Alcohol use: Not Currently  . Drug use: No    Types: Marijuana    Comment: 03/27/2016 "last marijuana was maybe in 2013"     Allergies   Flexeril [cyclobenzaprine]   Review of Systems Review of Systems  Constitutional: Positive for fever.  Cardiovascular: Negative for chest pain.  Gastrointestinal: Negative for vomiting.  Neurological: Negative for headaches.  All other systems reviewed and are negative.    Physical Exam Updated Vital Signs BP (!) 160/72 (BP Location: Left Arm)   Pulse  93   Temp (!) 102.7 F (39.3 C) (Oral)   Resp 18   SpO2 95%   Physical Exam  Constitutional: He is oriented to person, place, and time. He appears well-developed and well-nourished. No  distress.  HENT:  Head: Normocephalic and atraumatic.  Mouth/Throat: Oropharynx is clear and moist.  Eyes: Pupils are equal, round, and reactive to light. Conjunctivae and EOM are normal.  Neck: Normal range of motion. Neck supple.  Cardiovascular: Normal rate, regular rhythm and normal heart sounds.  Pulmonary/Chest: Effort normal and breath sounds normal. No respiratory distress.  Abdominal: Soft. He exhibits no distension. There is no tenderness.  Musculoskeletal: Normal range of motion. He exhibits no edema or deformity.  Right foot is status post recent transmetatarsal amputation.  There is moderate serosanguineous drainage from the wound.  There is erythema surrounding the incision. See photos below.   Neurological: He is alert and oriented to person, place, and time.  Skin: Skin is warm and dry.  Psychiatric: He has a normal mood and affect.  Nursing note and vitals reviewed.          ED Treatments / Results  Labs (all labs ordered are listed, but only abnormal results are displayed) Labs Reviewed  COMPREHENSIVE METABOLIC PANEL - Abnormal; Notable for the following components:      Result Value   Sodium 133 (*)    Chloride 96 (*)    Glucose, Bld 204 (*)    Calcium 8.7 (*)    Albumin 2.9 (*)    ALT 16 (*)    GFR calc non Af Amer 59 (*)    All other components within normal limits  CBC WITH DIFFERENTIAL/PLATELET - Abnormal; Notable for the following components:   WBC 16.6 (*)    RBC 3.20 (*)    Hemoglobin 8.8 (*)    HCT 27.9 (*)    RDW 16.4 (*)    Neutro Abs 13.0 (*)    Monocytes Absolute 1.9 (*)    All other components within normal limits  URINALYSIS, ROUTINE W REFLEX MICROSCOPIC - Abnormal; Notable for the following components:   Protein, ur 30 (*)    All other components  within normal limits  CBG MONITORING, ED - Abnormal; Notable for the following components:   Glucose-Capillary 205 (*)    All other components within normal limits  I-STAT CG4 LACTIC ACID, ED - Abnormal; Notable for the following components:   Lactic Acid, Venous 2.36 (*)    All other components within normal limits  CULTURE, BLOOD (ROUTINE X 2)  CULTURE, BLOOD (ROUTINE X 2)  AEROBIC CULTURE (SUPERFICIAL SPECIMEN)  PROTIME-INR  I-STAT CG4 LACTIC ACID, ED    EKG EKG Interpretation  Date/Time:  Wednesday Mar 03 2018 15:36:18 EDT Ventricular Rate:  94 PR Interval:    QRS Duration: 88 QT Interval:  348 QTC Calculation: 436 R Axis:   57 Text Interpretation:  Sinus rhythm Abnormal T, consider ischemia, diffuse leads Confirmed by Dene Gentry [phone]) on 03/03/2018 3:48:55 PM   Radiology No results found.  Procedures Procedures (including critical care time)  Medications Ordered in ED Medications  acetaminophen (TYLENOL) tablet 650 mg (has no administration in time range)  piperacillin-tazobactam (ZOSYN) IVPB 3.375 g (has no administration in time range)     Initial Impression / Assessment and Plan / ED Course  I have reviewed the triage vital signs and the nursing notes.  Pertinent labs & imaging results that were available during my care of the patient were reviewed by me and considered in my medical decision making (see chart for details).    1600 Dr. Sharol Given paged re consult.  [place] in [place] staff to inform him of patient's presence in ED. Awaiting  call back.  1830 Case discussed with Dr. Erlinda Hong - ortho will consult - he requests admission to hospitalist service.    MDM  Screen complete Patient is presenting with fever likely secondary to an infected right foot.  Patient status post recent trans-metatarsal amputation of the right foot.  Given his exam I suspect that this is the source for his infection.  Patient was given broad coverage antibiotics upon arrival.   Screening labs reveal mildly elevated lactic acid with concurrent leukocytosis.   Patient will require admission to the hospital for further IV antibiotics and, I suspect, possible debridement and/or additional amputation.   Final Clinical Impressions(s) / ED Diagnoses   Final diagnoses:  Fever, unspecified fever cause  Diabetic foot infection [facility])    ED Discharge Orders    None       Valarie Merino, MD 03/03/18 [phone]

## 2018-03-03 NOTE — ED Notes (Signed)
Patient returned from X-ray 

## 2018-03-03 NOTE — Progress Notes (Signed)
Pharmacy Antibiotic Note  Leslie [name] is a 72 y.o. male presenting from [facility] with AMS and fever s/p recent partial R foot amputation. Pharmacy has been consulted for vancomycin dosing. Patient also to receive Zosyn x1 in the ED. WBC 16.6, temp 102.7. Scr 1.2 (BL ~1), estimated normalized CrCl ~57 mL/min.  Plan: Vancomycin 2g IV x1 Then vancomycin 1250mg  IV q24h. Goal trough 15-20 mcg/mL. F/u C&S, clinical status, renal function, de-escalation, LOT, vancomycin levels as indicated  Temp (24hrs), Avg:102.7 F (39.3 C), Min:102.7 F (39.3 C), Max:102.7 F (39.3 C)  Recent Labs  Lab 03/03/18 1616 03/03/18 1624  WBC 16.6*  --   CREATININE 1.20  --   LATICACIDVEN  --  2.36*    Estimated Creatinine Clearance: 61.2 mL/min (by C-G formula based on SCr of 1.2 mg/dL).    Allergies  Allergen Reactions  . Flexeril [Cyclobenzaprine] Other (See Comments)    Dry mouth and hallucinations.    Antimicrobials this admission: Vanc 5/8 >> Zosyn 5/8 x1  Dose adjustments this admission: None  Microbiology results: 5/8 BCx: pending  Thank you for allowing pharmacy to be a part of this patient's care.  Mila Merry Gerarda Fraction, PharmD PGY1 Pharmacy Resident Pager: [phone] 03/03/2018 5:05 PM

## 2018-03-03 NOTE — H&P (Signed)
Date: 03/03/2018               Patient Name:  Leslie [name] MRN: [id number]  DOB: 1945/11/11 Age / Sex: 72 y.o., male   PCP: [name], MD         Medical Service: Internal Medicine Teaching Service         Attending Physician: Dr. Bartholomew Crews, MD    First Contact: Dr. Berneice Gandy Pager: [phone]  Second Contact: Dr. Reesa Chew Pager: [phone]       After Hours (After 5p/  First Contact Pager: [phone]  weekends / holidays): Second Contact Pager: [phone]   Chief Complaint: Altered Mental Status, Fever  History of Present Illness: Leslie [name] is a 72 yo M with Hx of COPD, DM, PAD, S/P L BKA and R TMA(On 02/22/18), Asthma, and Anxiety who presented from [facility] rehab with altered mental status and fever. Patient A&Ox2 (not to time) and is A&O x3 at baseline per facility RN according to EMS report. He states that he has felt in his usual state of health other than noting increased pain in his right leg. He says he has not been doing any rehab yet at the facility as they were waiting for his wound to heal. He denies subjective fevers, chills, chest pain, dyspnea, cough, nausea, constipation, diarrhea, urinary symptoms, or light headedness. Patient underwent R TMA on 02/19/2018 after dehiscence of wound from R 3rd ray amputation on 01/22/2018.  In the ED, Patient noted to have fever to 102.7; vitals otherwise stable. CMP was without significant abnormality; CBC showed WBC 16 (11 on discharge), Hgb 8.8 (baseline 9-10); Lactate 2.36 > 1.39 (after IVF); UA was WNL; and Blood cultures were obtained. Patient received 543mL NS Bolus, IV Vancomycin, IV Zosyn, and tylenol. Orthopedics was consulted in the ED, who stated they would see the patient. Patient to be admitted for further workup and care.   Meds:  No current facility-administered medications on file prior to encounter.    Current Outpatient Medications on File Prior to Encounter  Medication Sig  . albuterol (ACCUNEB) 1.25 MG/3ML  nebulizer solution Take 1 ampule by nebulization every 6 (six) hours as needed for wheezing.  Marland Kitchen albuterol (PROVENTIL HFA;VENTOLIN HFA) 108 (90 Base) MCG/ACT inhaler Inhale 2 puffs into the lungs every 4 (four) hours as needed for shortness of breath.  . allopurinol (ZYLOPRIM) 100 MG tablet Take 100 mg by mouth daily.  Marland Kitchen amLODipine (NORVASC) 10 MG tablet Take 1 tablet (10 mg total) by mouth daily.  Marland Kitchen aspirin 81 MG tablet Take 81 mg by mouth daily.  . carbamazepine (TEGRETOL) 200 MG tablet Take 200-300 mg by mouth 2 (two) times daily. Take one tablet (200mg ) in the morning and one and a half tablets (300mg ) in the evening.  . cholecalciferol (VITAMIN D) 1000 UNITS tablet Take 1,000 Units by mouth daily.  . clonazePAM (KLONOPIN) 1 MG tablet Take 1 mg by mouth daily.  . DULoxetine (CYMBALTA) 60 MG capsule Take 60 mg by mouth daily.  . Evolocumab (REPATHA SURECLICK) 034 MG/ML SOAJ Inject 140 mg into the skin every 14 (fourteen) days.   . furosemide (LASIX) 20 MG tablet Take 20 mg by mouth daily.  Marland Kitchen glimepiride (AMARYL) 4 MG tablet Take 4 mg by mouth daily with breakfast.  . guaiFENesin (SILTUSSIN DAS) 100 MG/5ML liquid Take 200 mg by mouth every 4 (four) hours.  . hydrALAZINE (APRESOLINE) 50 MG tablet Take 50 mg by mouth daily.  Marland Kitchen HYDROcodone-acetaminophen (  NORCO) 10-325 MG tablet Take 1 tablet by mouth every 4 (four) hours as needed for moderate pain.  Marland Kitchen insulin glargine (LANTUS) 100 UNIT/ML injection Inject 25 Units into the skin daily.   Marland Kitchen losartan (COZAAR) 100 MG tablet Take 100 mg by mouth daily.  . Menthol, Topical Analgesic, (BIOFREEZE) 4 % GEL Apply 1 application topically 2 (two) times daily.  . metFORMIN (GLUCOPHAGE) 1000 MG tablet Take 1 tablet (1,000 mg total) by mouth 2 (two) times daily with a meal.  . metoprolol (LOPRESSOR) 100 MG tablet Take 2 tablets (200 mg total) by mouth 2 (two) times daily.  . mirtazapine (REMERON) 7.5 MG tablet Take 7.5 mg by mouth at bedtime.  . Multiple  Vitamin (MULTIVITAMIN WITH MINERALS) TABS tablet Take 1 tablet by mouth daily.  . nitroGLYCERIN (NITRODUR - DOSED IN MG/24 HR) 0.2 mg/hr patch Place 1 patch (0.2 mg total) onto the skin daily.  . nitroGLYCERIN (NITROSTAT) 0.4 MG SL tablet Place 0.4 mg under the tongue every 5 (five) minutes as needed for chest pain.   Marland Kitchen nystatin (NYSTATIN) powder Apply 1 Bottle topically 4 (four) times daily.  . pentoxifylline (TRENTAL) 400 MG CR tablet Take 1 tablet (400 mg total) by mouth 3 (three) times daily with meals.  . pregabalin (LYRICA) 150 MG capsule Take 150 mg by mouth 3 (three) times daily.   . ranitidine (ZANTAC) 300 MG capsule Take 300 mg by mouth every evening.  . senna (SENOKOT) 8.6 MG tablet Take 2 tablets by mouth 2 (two) times daily.     Allergies: Allergies as of 03/03/2018 - Review Complete 03/03/2018  Allergen Reaction Noted  . Flexeril [cyclobenzaprine] Other (See Comments) 07/23/2012   Past Medical History:  Diagnosis Date  . Anxiety    Panic attack - 10  years aago- ? panic   . Asthma   . Cellulitis of left foot 2016   Healed and recurred 01/2016  . Cellulitis of right foot 06/30/2012  . Chronic lower back pain 07/01/2012  . COPD (chronic obstructive pulmonary disease) (HCC)    Uses nebulizers at home. Former smoker, current chewing tobacco. 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  Marland Kitchen GERD (gastroesophageal reflux disease)   . Gouty arthritis 07/01/2012   Not attacks in ~5 yrs (03/2016)  . Hepatitis 1967   "in jail when I got it; dr said it was pretty bad; quaranteened X 30d"  . Hyperlipidemia   . Hypertension   . Lung nodule, solitary 07/2015   Due for 1-yr recheck in 07/2017  . Peripheral arterial disease ([place])    critical limb ischemia  . Peripheral neuropathy 07/01/2012  . Seasonal allergies   . Shortness of breath dyspnea   . Skin growth 07/01/2012   right side of nares; "been on there 20 years"  . Substance abuse ([place])   . Type II diabetes mellitus ([place]) 2002   Diagnosed in 2002    Family  History:  Family History  Problem Relation Age of Onset  . Diabetes Sister   . Hypertension Mother   - Reviewed on admision  Social History:  Social History   Tobacco Use  . Smoking status: Former Smoker    Packs/day: 2.00    Years: 40.00    Pack years: 80.00    Types: Cigarettes    Last attempt to quit: 01/13/2002    Years since quitting: 16.1  . Smokeless tobacco: Current User    Types: Chew  . Tobacco comment: chews a little tobacco  Substance Use Topics  . Alcohol  use: Not Currently    Comment: 03/03/18: "Used to drink alot. No alcohol in a long time."  . Drug use: No    Types: Marijuana    Comment: 03/27/2016 "last marijuana was maybe in 2013"  - Reviewed on Admission  Review of Systems: A complete ROS was negative except as per HPI.  Physical Exam: Blood pressure (!) 138/57, pulse 83, temperature (!) 102.7 F (39.3 C), temperature source Oral, resp. rate 16, SpO2 97 %. Physical Exam  Constitutional: He appears well-developed and well-nourished. No distress.  Lethargic, Obese Male  HENT:  Head: Normocephalic and atraumatic.  Eyes: EOM are normal. Right eye exhibits no discharge. Left eye exhibits no discharge.  Cardiovascular: Normal rate and regular rhythm.  Systolic Murmur  Pulmonary/Chest: Effort normal and breath sounds normal. No respiratory distress.  Abdominal: Soft. Bowel sounds are normal. There is no tenderness.  Obese abdomen  Musculoskeletal:  S/P R TMA with serosanguinous drainage from wound and erythema surrounding incision site. S/P Left BKA  Neurological: He is alert.  Mildly drowsy, Oriented to person and place but not time. Delayed response when answering questions, but does answer appropriately  Skin: Skin is warm and dry.   EKG: personally reviewed my interpretation is sinus rhythm at 94 BPM, T-wave inversions diffusely, more prominently laterally appear to be new from previous.  CXR: personally reviewed: good rotation slightly under  penetrated, fairly poor inspiration, no acute abnormalities.  Right Foot 2 View: IMPRESSION: - RIGHT midfoot amputation. - No acute abnormalities.  Assessment & Plan by Problem: 72 yo M with Hx of COPD, DM, PAD, S/P L BKA and R TMA(On 02/22/18), Asthma, and Anxiety who presented from [facility] rehab with altered mental status and fever  Fever, AMS History of Transmetatarsal amputation of R foot: Patient presenting for rehab facility with AMS and fever to ~103. Patient discharged on 02/22/2018 following R TMA that was performed due to progressive dehiscence of R 3rd Ray amputation wound with gangrenous changes of 4th digit. He has high risk for poor healing 2/2 PAD which includes severe calcification of R SFA not amenable to intervention. Patients AMS is suspected to be 2/2 to reinfection of TMA wound (not alternate source of infection or cause of AMS identified at this time based on current labs/imaging). Lactate elevated initially in ED, which improved with IVF and Blood cultures obtained in ED. Orthopedics consulted in the ED. > On 02/19/18 prior to TMA, Dr Sharol Given discussed with patient his high risk of this not healing and that the patient's best option for success would be R BKA, but patient was not ready to consider that option and chose to have R TMA. - Appreciate Orthopedics recommendations - Started on Vancomycin and Zosyn in ED, Will hold these pending operative cultures provided patient remains stable  - Continue Home Norco 10-325 q4h PRN - AM CBC and BMP  HTN: Normotensive to mildly hypertensive in ED. On Metoprolol, Amlodipine, Hydralazine, Losartan, and Lasix per EMR. Will need to verify with [facility]. - Continue Amlodipine 10mg  Daily, Will hold other medications for now.  HLD PAD: On Evolocumab 140mg  SQ q14d (PSK9-I), Pentoxifylline 400mg  TID, ASA 81mg  Daily > S/P L BKA by Dr.Duda6/16/17 for nonhealing ulcer / critical limb ischemia. S/P R TMA 02/22/18 as above. > Dopplers 04/27/17 showed  R ABI 0.85 with apparant occluded right SFA. - Holding home medications for now  DM with Neuropathy: On Metformin 1000mg  BID, Glimepiride 4mg  Daily, and Lantus 25U Daily > Will work to maintain CBGs <  200 to promote wound healing - Hold home oral mediations - Lantus 10U qhs - SSI-S  Asthma/COPD: On Albuterol PRN at home - Albuterol Neb q6h PRN  Gout: - Continue home Allopurinol 100mg  Daily  GERD: - Famotidine qhs  Chronic Low Back Pain: 2/2 Disc Herniation - Continue Home Norco 10-325 q4h PRN  Anxiety:  - Continue home Carbamazepine 200am/300pm, Clonazepam 1mg  Daily, Duloxetine 60mg  Daily - Holding home Remeron  FEN: CM, NPO at MN VTE ppx: Lovenox Code Status: Partiall: DNI, Compressions and Shock okay, will need further discussions as he had difficulty deciding, will also attempt to obtain records from [facility].   Dispo: Admit patient to Inpatient with expected length of stay greater than 2 midnights.  Signed: Neva Seat, MD 03/03/2018, 9:25 PM  Pager: [phone]

## 2018-03-03 NOTE — ED Notes (Signed)
Attempted to call report at [phone].  RN said he would call me back in within 5 mins

## 2018-03-03 NOTE — ED Triage Notes (Addendum)
Patient BIB [facility] from [place] for altered mental status and fever since today. Patient A&O x 3 at baseline. Recent partial RIGHT foot amputation. Fever 103.1 rectal.

## 2018-03-03 NOTE — ED Notes (Signed)
Patient undressed, leads off, and RIGHT AC IV removed.

## 2018-03-03 NOTE — ED Notes (Signed)
Spoke with Verne Spurr, RN with [facility] who is faxing over current med list and reports patient was d/c 4/29 for gangrene of RIGHT foot.

## 2018-03-03 NOTE — ED Notes (Signed)
Patient transported to X-ray 

## 2018-03-04 DIAGNOSIS — I10 Essential (primary) hypertension: Secondary | ICD-10-CM

## 2018-03-04 DIAGNOSIS — Z89411 Acquired absence of right great toe: Secondary | ICD-10-CM

## 2018-03-04 DIAGNOSIS — L03115 Cellulitis of right lower limb: Secondary | ICD-10-CM

## 2018-03-04 DIAGNOSIS — E1151 Type 2 diabetes mellitus with diabetic peripheral angiopathy without gangrene: Secondary | ICD-10-CM

## 2018-03-04 DIAGNOSIS — Z79899 Other long term (current) drug therapy: Secondary | ICD-10-CM

## 2018-03-04 DIAGNOSIS — Z7982 Long term (current) use of aspirin: Secondary | ICD-10-CM

## 2018-03-04 DIAGNOSIS — Z794 Long term (current) use of insulin: Secondary | ICD-10-CM

## 2018-03-04 DIAGNOSIS — I743 Embolism and thrombosis of arteries of the lower extremities: Secondary | ICD-10-CM

## 2018-03-04 DIAGNOSIS — E1140 Type 2 diabetes mellitus with diabetic neuropathy, unspecified: Secondary | ICD-10-CM

## 2018-03-04 DIAGNOSIS — Z89421 Acquired absence of other right toe(s): Secondary | ICD-10-CM

## 2018-03-04 DIAGNOSIS — Z79891 Long term (current) use of opiate analgesic: Secondary | ICD-10-CM

## 2018-03-04 DIAGNOSIS — Z89512 Acquired absence of left leg below knee: Secondary | ICD-10-CM

## 2018-03-04 DIAGNOSIS — F419 Anxiety disorder, unspecified: Secondary | ICD-10-CM

## 2018-03-04 DIAGNOSIS — J449 Chronic obstructive pulmonary disease, unspecified: Secondary | ICD-10-CM

## 2018-03-04 LAB — BASIC METABOLIC PANEL
Anion gap: 9 (ref 5–15)
BUN: 14 mg/dL (ref 6–20)
CHLORIDE: 99 mmol/L — AB (ref 101–111)
CO2: 27 mmol/L (ref 22–32)
CREATININE: 1.06 mg/dL (ref 0.61–1.24)
Calcium: 8.6 mg/dL — ABNORMAL LOW (ref 8.9–10.3)
GFR calc Af Amer: >60 mL/min (ref >60)
GFR calc non Af Amer: >60 mL/min (ref >60)
Glucose, Bld: 160 mg/dL — ABNORMAL HIGH (ref 65–99)
Potassium: 3.7 mmol/L (ref 3.5–5.1)
Sodium: 135 mmol/L (ref 135–145)

## 2018-03-04 LAB — CBC
HCT: 26.6 % — ABNORMAL LOW (ref 39.0–52.0)
Hemoglobin: 8.3 g/dL — ABNORMAL LOW (ref 13.0–17.0)
MCH: 27.1 pg (ref 26.0–34.0)
MCHC: 31.2 g/dL (ref 30.0–36.0)
MCV: 86.9 fL (ref 78.0–100.0)
PLATELETS: 212 10*3/uL (ref 150–400)
RBC: 3.06 MIL/uL — ABNORMAL LOW (ref 4.22–5.81)
RDW: 16.2 % — AB (ref 11.5–15.5)
WBC: 13.8 10*3/uL — ABNORMAL HIGH (ref 4.0–10.5)

## 2018-03-04 LAB — GLUCOSE, CAPILLARY
GLUCOSE-CAPILLARY: 150 mg/dL — AB (ref 65–99)
GLUCOSE-CAPILLARY: 142 mg/dL — AB (ref 65–99)
Glucose-Capillary: 149 mg/dL — ABNORMAL HIGH (ref 65–99)

## 2018-03-04 MED ORDER — CEFAZOLIN SODIUM-DEXTROSE 1-4 GM/50ML-% IV SOLN
1.0000 g | Freq: Three times a day (TID) | INTRAVENOUS | Status: DC
  Administered 2018-03-04 – 2018-03-05 (×4): 1 g via INTRAVENOUS
  Filled 2018-03-04 (×6): qty 50

## 2018-03-04 NOTE — Consult Note (Signed)
[facility] Nurse wound consult note Reason for Consult:Moist suture line from right transmetatarsal amputation on 02/19/18.  Will add topical therapy.  Orthopedics notes no dehiscence.  Wound type:Surgical Pressure Injury POA: NA Measurement: 0.3 cm x 14 cm suture line, weeping Wound AUE:[id number] and some pale pink nongranulating tissue noted.  Drainage (amount, consistency, odor) minimal serosanguinous  No odor Periwound: Dry, cracked skin. Chronic skin changes.  Dressing procedure/placement/frequency:Cleanse right foot TMA site with with NS.  Apply Xeroform to suture line. COver with 4x4 and kerilx/tape.  Change daily.  Will not follow at this time.  Please re-consult if needed.  Domenic Moras RN BSN [place] Pager [phone]

## 2018-03-04 NOTE — Progress Notes (Signed)
Patient ID: Leslie [name], male   DOB: Oct 10, 1946, 72 y.o.   MRN: [id number] Patient is seen in follow-up status post transmetatarsal amputation.  Patient does have some dermatitis he is sleeping with his leg hanging dependent off the bed.  There is no drainage there is some dermatitis but his foot actually looks better than it did in the emergency room photos last night.  Would continue IV antibiotics continue elevation strict nonweightbearing on the foot anticipate this should progress well without needing additional surgery.  Patient has no wound dehiscence.  There is no ascending cellulitis.

## 2018-03-04 NOTE — Progress Notes (Signed)
Pt heard yelling for help;found sitting on the floor;alert and oriented;multiple staff help to put pt back to bed;dressing placed on right TMA ;physician notified;safety zone portal and post fall huddle form completed

## 2018-03-04 NOTE — Progress Notes (Signed)
  Date: 03/04/2018  Patient name: Leslie [name]  Medical record number: [id number]  Date of birth: February 01, 1946   I have seen and evaluated Leslie [name] and discussed their care with the Residency Team. Leslie [name] is a 72 yo man with PAD and a L BKA. He had a R 3rd ray on 01/22/2018 which did not heal and then had a R TMA on 02/19/2018. He had AMS and a fever at his SNF and was sent to the ED. He was started on Vanc and Zosyn in ED but admitting team held anticipating surgery and need for bone bx. Dr Sharol Given has eval pt and wrote that pt did not have a wound dehis or ascending cellulitis. He did rec cont the IV ABX. Pt has been afebrile since admission.   PMHx, Fam Hx, and/or Soc Hx : Lives in SNF currently, [facility] pt  Vitals:   03/04/18 0340 03/04/18 1411  BP: (!) 155/88 (!) 165/68  Pulse: 87 (!) 110  Resp: 16 18  Temp: 97.9 F (36.6 C) 98.8 F (37.1 C)  SpO2: 98% 100%  T max 102.7 HR 110 HRRR no Murmur LCTAB with decent air flow Ext R TMA : well approximated surgical incision. About 2 cm erythema from anterior incision, more extensive erythema on plantar surface, dried blood and discharge. No odor.  WBC 16.6 - 13.8 HgB 8.8 - 8.3 Blood cxs no growth at 24 hrs  I personally viewed the CXR images and confirmed my reading with the official read. AP and lateral. Scoliosis laterally. No infiltrate  I personally viewed the EKG and confirmed my reading with the official read. Sinus, nl axis, inf lat TWI  ABI 7/18 occluded proximal R SFA with reconstitution   Assessment and Plan: I have seen and evaluated the patient as outlined above. I agree with the formulated Assessment and Plan as detailed in the residents' note, with the following changes: Leslie [name] is a 73 yo man with PAD, a L BKA, and a recent R TMA. He was admitted with AMS felt to be 2/2 R surgical wound infxn. Pt's foot looks better than from the ED. Will resume IV ABX and anticipate D/C in AM on PO ABX. I was concerned about ABX  penetration due to vascular dx but Dr Sharol Given feels wound will heal well.  Bartholomew Crews, MD 5/9/20193:20 PM

## 2018-03-04 NOTE — Progress Notes (Addendum)
Subjective:  Patient seen laying comfortably in bed this AM in no acute distress. Patient very sleepy this AM and didn't really want to be bothered by examiners but did state that he feels improved since admission. No acute complaints.   Objective:  Vital signs in last 24 hours: Vitals:   03/03/18 2215 03/03/18 2313 03/03/18 2351 03/04/18 0340  BP: (!) 124/48 (!) 154/96 (!) 179/93 (!) 155/88  Pulse: 85 79 97 87  Resp:  16  16  Temp:  98.7 F (37.1 C) 98 F (36.7 C) 97.9 F (36.6 C)  TempSrc:  Oral Oral Oral  SpO2: 97% 98% 100% 98%   Physical Exam  Constitutional: He is oriented to person, place, and time. No distress.  Appears older than stated age  HENT:  Mouth/Throat: Oropharynx is clear and moist. No oropharyngeal exudate.  Cardiovascular: Normal rate and regular rhythm. Exam reveals no friction rub.  No murmur heard. 2+ radial pulses  Respiratory: Effort normal. No respiratory distress. He has no wheezes. He has no rales.  GI: Soft. He exhibits no distension. There is no tenderness. There is no rebound.  Musculoskeletal: He exhibits no edema (trace, bilateral lower extremities) or tenderness (of bilateral lower extremities).  S/p L BKA and R TMA  Neurological: He is alert and oriented to person, place, and time.  Face strength and sensation intact bilaterally. Tongue midline. Gross motor and sensation to light touch of upper and lower extremities intact bilaterally.   Skin: He is not diaphoretic.  RLE extremity warm and well profused. R TMA incision with stitches and dried blood. Small opening of surgical incision, no active bleeding or purulent discharge. Wound appears to be in various stages of healing. Minimal erythema round wound site or purulent discharge.    Assessment/Plan:  Principal Problem:   Dehiscence of surgical wound Active Problems:   Diabetic neuropathy (HCC)   HTN (hypertension)   COPD (chronic obstructive pulmonary disease) (HCC)   Peripheral  arterial disease (HCC)   History of transmetatarsal amputation of right foot ([place])  Assessment & Plan by Problem: 72 yo M with Hx of COPD/asthma, insulin dependent DM, PAD s/p L BKA and recent R TMA on 02/22/18 and anxiety who presented from [facility] rehab with altered mental status and fever. Patient admitted to internal medicine teaching service with orthopedic surgery consulting. The specific problems addressed during admission are as follows:   R transmetatarsal amputation complicated by cellulitis: Patient febrile on admission, with elevated lactate (resolved with IVF) and leukocytosis. Overnight patient treated with vancomycin and zosyn. He has remained afebrile and hemodynamically stable since admission. Leukocytosis down trending this AM with IV antibiotics initiated on admission and clinical exam appears improved, with interval decrease in erythema around surgical incision site. Dr. Sharol Given examined patient this AM and did not suggest additional surgical intervention or wound dehiscence at this time. Patient will need to continue on antibiotics to cover cellulitis, but this can be done at rehabilitation facility if patient has close follow up for wound monitoring.  -Orthopedic surgery consulted, will touch base regarding need to remain inpatient for further monitoring -Wound care consulted, recommendations appreciated -IV ancef x7 days -Continue home Norco 10-325 q4h PRN and pregabalin 150 mg TID for pain  HTN: Patient mildly hypertensive today. Per chart review patient on hydralazine 50 mg, metoprolol 200 mg BID, losartan 100 mg, lasix 20 mg, and amlodipine 10 mg daily. -Will continue to monitor on amlodipine 10 mg and add back home medications as needed.  Hx  of HLD, PAD: Patient's extremities warm and well perfused this AM. Distal RLE pulse difficult to palpate 2/2 known SFA occlusion. Per chart review patient intolerant of statins, so on PSK9 inhibitor as outpatient, pentoxifylline 400mg   TID and aspirin 81 mg. -Resume home medications on discharge  Insulin dependent DM c/b neuropathy: Patient on metformin 1000mg  BID, glimepiride 4mg , and Lantus 30 units daily with SSI at meals as an outpatient. Recent CBGs within hospital goal of <180 on current regimen. -CBG monitoring TID with meals, qHS -Continue Lantus 10 units, qHS and SSI with meals  Asthma/COPD: No current signs or symptoms of exacerbation on exam. Will continue patient's home regimen of PRN albuterol while inpatient.  -Albuterol q6 hours PRN  Anxiety: Patient continued on home carbamazepine 200 mg qAM and 300 mg qPM, Klonopin 1mg  qHS, and duloxetine 60mg  daily while inpatient and home Remeron held on admission. Will continue this regimen and clinically monitor for signs/symptoms of acute anxiety.  FEN/GI: -HH, CM diet -No IVF, replace electrolytes as needed -Famotidine qHS (home zantac on to formulary)  VTE Prophylaxis: Lovenox daily Code Status: Partial, do not intubate  Dispo: Anticipated discharge in approximately 0-1 day.   Thomasene Ripple, MD 03/04/2018, 9:02 AM Pager: [phone]

## 2018-03-04 NOTE — Plan of Care (Signed)

## 2018-03-04 NOTE — Discharge Summary (Signed)
Name: Leslie [name] MRN: [id number] DOB: 12/26/1945 72 y.o. PCP: Leslie Adie, MD  Date of Admission: 03/03/2018  3:23 PM Date of Discharge: 03/05/2018 Attending Physician: Leslie Crews, MD  Discharge Diagnosis:  Principal Problem:   Cellulitis of foot without toes, right Active Problems:   Diabetic neuropathy ([place])   HTN (hypertension)   COPD (chronic obstructive pulmonary disease) ([place])   Peripheral arterial disease ([place])   History of transmetatarsal amputation of right foot [facility])  Discharge Medications: Allergies as of 03/05/2018      Reactions   Statins Other (See Comments)   "Allergic," per MAR   Flexeril [cyclobenzaprine] Other (See Comments)   Dry mouth and hallucinations      Medication List    STOP taking these medications   GERI-TUSSIN PO   hydrALAZINE 50 MG tablet Commonly known as:  APRESOLINE   HYDROcodone-acetaminophen 10-325 MG tablet Commonly known as:  NORCO   metoprolol tartrate 100 MG tablet Commonly known as:  LOPRESSOR   SILTUSSIN SA PO     TAKE these medications   albuterol 108 (90 Base) MCG/ACT inhaler Commonly known as:  PROVENTIL HFA;VENTOLIN HFA Inhale 2 puffs into the lungs 4 (four) times daily as needed for wheezing or shortness of breath.   albuterol (2.5 MG/3ML) 0.083% nebulizer solution Commonly known as:  PROVENTIL Take 2.5 mg by nebulization every 6 (six) hours as needed for wheezing or shortness of breath.   allopurinol 100 MG tablet Commonly known as:  ZYLOPRIM Take 100 mg by mouth daily.   amLODipine 10 MG tablet Commonly known as:  NORVASC Take 1 tablet (10 mg total) by mouth daily.   antiseptic oral rinse Liqd 10 mLs by Mouth Rinse route daily.   aspirin 81 MG tablet Take 81 mg by mouth daily.   BIOFREEZE 4 % Gel Generic drug:  Menthol (Topical Analgesic) Apply 1 application topically 2 (two) times daily.   bisacodyl 10 MG suppository Commonly known as:  DULCOLAX Place 10 mg rectally once as  needed (for constipation not relieved by Milk of Magnesia).   carbamazepine 200 MG tablet Commonly known as:  TEGRETOL Take 200-300 mg by mouth See admin instructions. Take 200 mg by mouth in the morning and 300 mg in the evening   cephALEXin 500 MG capsule Commonly known as:  KEFLEX Take 1 capsule (500 mg total) by mouth 4 (four) times daily for 5 days.   CERTAVITE SENIOR/ANTIOXIDANT Tabs Take 1 tablet by mouth daily.   clonazePAM 1 MG tablet Commonly known as:  KLONOPIN Take 1 mg by mouth at bedtime.   DULoxetine 60 MG capsule Commonly known as:  CYMBALTA Take 60 mg by mouth daily.   furosemide 20 MG tablet Commonly known as:  LASIX Take 20 mg by mouth daily.   glimepiride 4 MG tablet Commonly known as:  AMARYL Take 4 mg by mouth daily.   insulin glargine 100 UNIT/ML injection Commonly known as:  LANTUS Inject 30 Units into the skin daily.   losartan 100 MG tablet Commonly known as:  COZAAR Take 100 mg by mouth daily.   magnesium hydroxide 400 MG/5ML suspension Commonly known as:  MILK OF MAGNESIA Take 30 mLs by mouth once as needed for mild constipation.   metFORMIN 1000 MG tablet Commonly known as:  GLUCOPHAGE Take 1 tablet (1,000 mg total) by mouth 2 (two) times daily with a meal.   metoprolol succinate 100 MG 24 hr tablet Commonly known as:  TOPROL-XL Take 200 mg by  mouth 2 (two) times daily. Take with or immediately following a meal.   MINERIN Crea Apply 1 application topically 2 (two) times daily.   mirtazapine 7.5 MG tablet Commonly known as:  REMERON Take 7.5 mg by mouth at bedtime.   nitroGLYCERIN 0.4 MG SL tablet Commonly known as:  NITROSTAT Place 0.4 mg under the tongue every 5 (five) minutes x 3 doses as needed for chest pain. What changed:  Another medication with the same name was removed. Continue taking this medication, and follow the directions you see here.   NOVOLOG FLEXPEN 100 UNIT/ML FlexPen Generic drug:  insulin aspart Inject  0-12 Units into the skin See admin instructions. Inject into the skin three times a day before meals, per sliding scale: BGL <200 = 0 units; 200-299 = 6 units; 300-399 = 9 units; > or equal to 400 = 12 units   nystatin powder Generic drug:  nystatin Apply topically to affected area three times a day for jock itch for 7 days   pentoxifylline 400 MG CR tablet Commonly known as:  TRENTAL Take 1 tablet (400 mg total) by mouth 3 (three) times daily with meals.   pregabalin 150 MG capsule Commonly known as:  LYRICA Take 150 mg by mouth 3 (three) times daily.   RA SALINE ENEMA 19-7 GM/118ML Enem Place 1 enema rectally once as needed (for constipation not relieved by Dulcolax suppository and call MD if no relief from enema).   ranitidine 300 MG capsule Commonly known as:  ZANTAC Take 300 mg by mouth at bedtime.   REPATHA SURECLICK 601 MG/ML Soaj Generic drug:  Evolocumab Inject 140 mg into the skin every 14 (fourteen) days.   SENEXON-S 8.6-50 MG tablet Generic drug:  senna-docusate Take 2 tablets by mouth 2 (two) times daily.   Vitamin D-3 1000 units Caps Take 1,000 Units by mouth daily.       Disposition and follow-up:   [name] was discharged from [facility] in Stable condition.  At the hospital follow up visit please address:  1. Patient admitted with fever, AMS, and R foot erythema/pain after recent R transmetatarsal amputation on 02/22/2018. He was treated with IV antibiotics for surgical site cellulitis with good improvement in his symptoms. He was discharged with 5 days of oral keflex and close orthopedic surgery follow up. Please assess for resolution of infection and wound healing. Please assess follow up with orthopedic surgery and wound care as outpatient.   2.  Labs / imaging needed at time of follow-up: None  3.  Pending labs/ test needing follow-up: Blood cultures  Follow-up Appointments: Follow-up Information    Leslie Adie, MD.  Schedule an appointment as soon as possible for a visit.   Specialty:  Family Medicine Why:  Please make appointment within 1 week of discharge to go over your hospitalization with your primary care physician.  Contact information: [place] [address] [phone]        Newt Minion, MD. Go to.   Specialty:  Orthopedic Surgery Why:  Please go to your follow up appointment on Monday, May 13 with Dr. Sharol Given so that he can check your wound and monitor your infection.  Contact information: [place] [address] [phone]           Hospital Course by problem list: Principal Problem:   Cellulitis of foot without toes, right Active Problems:   Diabetic neuropathy (HCC)   HTN (hypertension)  COPD (chronic obstructive pulmonary disease) ([place])   Peripheral arterial disease ([place])   History of transmetatarsal amputation of right foot ([place])   Dietrich Samuelson is a 72 yo M with PMH of COPD/asthma, insulin dependent DM, PAD s/p L BKA and recent R transmetatarsal amputation on 02/22/18 and anxiety who presented from [facility] with altered mental status and fever. The patient was found to have erythema surrounding a surgical incision from recent R TMA on admission. He was admitted to internal medicine teaching service with orthopedic surgery consulting. The specific problems addressed during admission are as follows:   R transmetatarsal amputation complicated by cellulitis: Upon arrival to the ED the patient was febrile to >102F on admission and had a leukocyte count of 16.6. His physical exam was significant for erythema and warmth around the patient's surgical wound from recent transmetatarsal amputation on RLE. Xray did not show acute bony abnormalities concerning for osteomyelitis. Given the above, the patient met septic criteria and blood cultures were obtained. Broad spectrum IV antibiotics, including vancomycin and cefepime,  along with IV fluid resuscitation was initiated in the ED. The patient remained hemodynamically stable throughout admission. Overnight the patient's fever resolved. His leukocytosis down trended after initiation of antibiotic therapy and blood cultures were no growth at 48 hours on discharge. His clinical exam also improved with IV antibiotics, with continued improvement in erythema throughout admission, and patient reported significant improvement in foot pain prior to discharge. The patient's orthopedic surgeon, Dr. Sharol Given of [facility], was consulted in the ED to evaluate the wound for possible dehiscence. Dr. Sharol Given did not feel that there was significant wound dehiscence that would require further surgical intervention, but did agree that there was a mild cellulitis surrounding the healing surgical site. Given that there was no obvious purulence on physical exam the patient's antibiotics were narrowed to ancef to cover for non-purulent cellulitis. Patient will continue treatment for this as outpatient upon discharge. He was given a 5 day course of Keflex/cephalexin 500 mg QID for 5 days to complete his treatment. He was scheduled for close follow up with [facility] for wound monitoring/management. Please ensure that patient has scheduled follow up with surgeon, Dr. Sharol Given, after discharge.  Discharge Vitals:   BP (!) 161/76 (BP Location: Left Arm)   Pulse 98   Temp 98.2 F (36.8 C) (Oral)   Resp 18   SpO2 (!) 60%   Pertinent Labs, Studies, and Procedures:  BMP Latest Ref Rng & Units 03/04/2018 03/03/2018 02/19/2018  Glucose 65 - 99 mg/dL 160(H) 204(H) 119(H)  BUN 6 - 20 mg/dL 14 12 15   Creatinine 0.61 - 1.24 mg/dL 1.06 1.20 1.02  Sodium 135 - 145 mmol/L 135 133(L) 132(L)  Potassium 3.5 - 5.1 mmol/L 3.7 4.7 6.0(H)  Chloride 101 - 111 mmol/L 99(L) 96(L) 101  CO2 22 - 32 mmol/L 27 27 19(L)  Calcium 8.9 - 10.3 mg/dL 8.6(L) 8.7(L) 8.1(L)   CBC Latest Ref Rng & Units 03/04/2018 03/03/2018  02/19/2018  WBC 4.0 - 10.5 K/uL 13.8(H) 16.6(H) 11.8(H)  Hemoglobin 13.0 - 17.0 g/dL 8.3(L) 8.8(L) 8.6(L)  Hematocrit 39.0 - 52.0 % 26.6(L) 27.9(L) 26.5(L)  Platelets 150 - 400 K/uL 212 249 282   Blood cultures x2 = No growth @ 48 hours Lactic acid = 2.36, 1.39 on repeat  Urinalysis    Component Value Date/Time   COLORURINE YELLOW 03/03/2018 [place] 03/03/2018 1541   LABSPEC 1.012 03/03/2018 1541   PHURINE 7.0 03/03/2018 1541  GLUCOSEU NEGATIVE 03/03/2018 1541   HGBUR NEGATIVE 03/03/2018 1541   [place] 03/03/2018 1541   KETONESUR NEGATIVE 03/03/2018 1541   PROTEINUR 30 (A) 03/03/2018 1541   NITRITE NEGATIVE 03/03/2018 1541   LEUKOCYTESUR NEGATIVE 03/03/2018 1541   Xray R Foot, 2 view, 03/03/2018 FINDINGS: Amputation of RIGHT foot through medial cuneiform and cuboid. Minimal resorption at amputation line. Joint spaces preserved. Soft tissue swelling and stump. No acute fracture, dislocation, or bone destruction. Small vessel vascular calcifications.  IMPRESSION: RIGHT midfoot amputation. No acute abnormalities.  Discharge Instructions: Discharge Instructions    Call MD for:  persistant nausea and vomiting   Complete by:  As directed    Call MD for:  redness, tenderness, or signs of infection (pain, swelling, redness, odor or green/yellow discharge around incision site)   Complete by:  As directed    Call MD for:  temperature >100.4   Complete by:  As directed    Diet - low sodium heart healthy   Complete by:  As directed    Discharge instructions   Complete by:  As directed    You were evaluated for fever and foot pain after a recent surgery. You were found to have a superficial infection in the skin surrounding your recent incision. You were given IV antibiotics while in the hospital, with good improvement in your symptoms and signs of infection. Please continue taking these antibiotics after leaving the hospital. You will need to take Keflex  (antibiotic name = cephalaxin) one 500 mg tablet four times a day for the next 5 days. You will need to follow up with your surgeon, Dr. Sharol Given, as an outpatient for further management of this wound. You already have follow up scheduled for the afternoon on Monday, Mar 08, 2018. Please be sure to attend this appointment.   Increase activity slowly   Complete by:  As directed       Signed: Thomasene Ripple, MD 03/05/2018, 1:25 PM   Pager: [phone]

## 2018-03-04 NOTE — Progress Notes (Signed)
Pharmacy Antibiotic Note  Leslie [name] is a 72 y.o. male admitted on 03/03/2018 s/p recent partial R-foot amputation now with cellulitis vs dermatitis. Pharmacy has been consulted for Cefazolin dosing.  WBC trending down at 13.8.  SCr stable. Febrile on admit, but now afebrile.   Plan: Cefazolin 1g IV every 8 hours.  Monitor renal function, culture results, and clinical status.      Temp (24hrs), Avg:99.4 F (37.4 C), Min:97.9 F (36.6 C), Max:102.7 F (39.3 C)  Recent Labs  Lab 03/03/18 1616 03/03/18 1624 03/03/18 1856 03/04/18 0415  WBC 16.6*  --   --  13.8*  CREATININE 1.20  --   --  1.06  LATICACIDVEN  --  2.36* 1.39  --     Estimated Creatinine Clearance: 69.3 mL/min (by C-G formula based on SCr of 1.06 mg/dL).    Allergies  Allergen Reactions  . Statins Other (See Comments)    "Allergic," per MAR  . Flexeril [Cyclobenzaprine] Other (See Comments)    Dry mouth and hallucinations    Antimicrobials this admission: Vanc 5/8 >>5/9 Zosyn 5/8 x1 Ancef 5/9 >>  Dose adjustments this admission:   Microbiology results: 5/8 Bcx: pending 5/9 Wound cx: sent  Thank you for allowing pharmacy to be a part of this patient's care.  Sloan Leiter, PharmD, BCPS, BCCCP Clinical Pharmacist Clinical phone 03/04/2018 until 3:30PM [phone] After hours, please call [phone] 03/04/2018 2:46 PM

## 2018-03-05 DIAGNOSIS — Z888 Allergy status to other drugs, medicaments and biological substances status: Secondary | ICD-10-CM

## 2018-03-05 LAB — GLUCOSE, CAPILLARY
GLUCOSE-CAPILLARY: 155 mg/dL — AB (ref 65–99)
GLUCOSE-CAPILLARY: 248 mg/dL — AB (ref 65–99)
GLUCOSE-CAPILLARY: 256 mg/dL — AB (ref 65–99)

## 2018-03-05 MED ORDER — CEPHALEXIN 500 MG PO CAPS: 500.0000 mg | ORAL_CAPSULE | Freq: Four times a day (QID) | ORAL | 0 refills | Status: AC

## 2018-03-05 MED ORDER — LOSARTAN POTASSIUM 50 MG PO TABS
100.0000 mg | ORAL_TABLET | Freq: Every day | ORAL | Status: DC
  Administered 2018-03-05: 100 mg via ORAL
  Filled 2018-03-05: qty 2

## 2018-03-05 NOTE — Clinical Social Work Note (Signed)
Clinical Social Work Assessment  Patient Details  Name: Leslie [name] MRN: [id number] Date of Birth: 24-Dec-1945  Date of referral:  03/05/18               Reason for consult:  Facility Placement                Permission sought to share information with:  Facility Art therapist granted to share information::  Yes, Verbal Permission Granted  Name::        Agency::  SNF  Relationship::     Contact Information:     Housing/Transportation Living arrangements for the past 2 months:  Single Family Home Source of Information:  Patient Patient Interpreter Needed:  None Criminal Activity/Legal Involvement Pertinent to Current Situation/Hospitalization:  No - Comment as needed Significant Relationships:  Friend Lives with:  Self Do you feel safe going back to the place where you live?  No Need for family participation in patient care:  No (Coment)  Care giving concerns:  Pt with right foot impairment, went to [facility] on 4/29 and returned due to dehiscing of wound. Pt will return back to SNF to continue skilled nursing needs. Pt is managed by [facility]. CSW will f/u with [facility] and desired SNF.  Social Worker assessment / plan:  CSW met with patient at bedside to confirm return to [facility]. Pt will need rehab to continue. CSW will confirm with [facility] and SNF that he will return.   CSW will f/u for disposition.  Employment status:  Retired Nurse, adult PT Recommendations:  [place] / Referral to community resources:  [place]  Patient/Family's Response to care:  Pt agreeable to SNF and thanked CSW for meeting to discuss.  Patient/Family's Understanding of and Emotional Response to Diagnosis, Current Treatment, and Prognosis:  Patient has good understanding of his diagnosis and has appears to have good emotional feelings about his disposition and return  to SNF. Pt desires to get better and then return home to his independence. CSW will f/u for disposition. No issues or concerns.  Emotional Assessment Appearance:  Appears stated age Attitude/Demeanor/Rapport:  (Cooperative) Affect (typically observed):  Accepting, Appropriate Orientation:  Oriented to Self, Oriented to Place, Oriented to  Time, Oriented to Situation Alcohol / Substance use:  Not Applicable Psych involvement (Current and /or in the community):  No (Comment)  Discharge Needs  Concerns to be addressed:  Discharge Planning Concerns Readmission within the last 30 days:  No Current discharge risk:  Physical Impairment, Dependent with Mobility, Lives alone Barriers to Discharge:  No Barriers Identified   Normajean Baxter, LCSW 03/05/2018, 2:12 PM

## 2018-03-05 NOTE — Progress Notes (Signed)
Subjective:  Patient seen sitting comfortably in bed in no acute distress. Patient states his foot feels better and has no acute complaints. Wants to go home today.  Objective:  Vital signs in last 24 hours: Vitals:   03/04/18 0340 03/04/18 1411 03/04/18 2054 03/05/18 0318  BP: (!) 155/88 (!) 165/68 (!) 152/71 (!) 161/76  Pulse: 87 (!) 110 (!) 103 98  Resp: 16 18 18 18   Temp: 97.9 F (36.6 C) 98.8 F (37.1 C)  98.2 F (36.8 C)  TempSrc: Oral Oral  Oral  SpO2: 98% 100% (!) 87% (!) 60%   Physical Exam  Constitutional: He is oriented to person, place, and time. No distress.  Appears older than stated age  HENT:  Mouth/Throat: Oropharynx is clear and moist. No oropharyngeal exudate.  Cardiovascular: Normal rate and regular rhythm. Exam reveals no friction rub.  No murmur heard. 2+ radial pulses  Respiratory: Effort normal. No respiratory distress. He has no wheezes. He has no rales.  GI: Soft. He exhibits no distension. There is no tenderness. There is no rebound.  Musculoskeletal: He exhibits no edema (trace, bilateral lower extremities) or tenderness (of bilateral lower extremities).  S/p L BKA and R TMA  Neurological: He is alert and oriented to person, place, and time.  Face strength and sensation intact bilaterally. Tongue midline. Gross motor and sensation to light touch of upper and lower extremities intact bilaterally.   Skin: He is not diaphoretic.  RLE extremity warm and well profused. R TMA incision with stitches and dried blood. Interval worsening of small opening in surgical incision observed yesterday, no active bleeding or purulent discharge but ongoing serosanguinous discharge with exam. Minimal erythema round wound site or purulent discharge, also improved from admission.     Assessment/Plan:  Principal Problem:   Cellulitis of foot without toes, right Active Problems:   Diabetic neuropathy (HCC)   HTN (hypertension)   COPD (chronic obstructive pulmonary  disease) (HCC)   Peripheral arterial disease (HCC)   History of transmetatarsal amputation of right foot ([place])  Assessment & Plan by Problem: 72 yo M with Hx of COPD/asthma, insulin dependent DM, PAD s/p L BKA and recent R TMA on 02/22/18 and anxiety who presented from [facility] rehab with altered mental status and fever. Patient admitted to internal medicine teaching service with orthopedic surgery consulting. The specific problems addressed during admission are as follows:   R transmetatarsal amputation complicated by cellulitis: Patient has been afebrile since admission and clinical exam improving on IV antibiotics. Will discharge patient with oral antibiotics since he has clinically improved. Will touch base with orthopedic surgeon regarding possible wound dehiscence noticed on AM rounds, as there has been interval opening of the wound noted from yesterday's exam prior to discharge. Patient already has follow up scheduled on Monday 03/08/2018. -Orthopedic surgery consulted and recommendations appreciated -Wound care consulted, recommendations appreciated -IV ancef -> transition to keflex 5 days as outpatient with close follow up already scheduled on Monday, 03/08/2018. -Continue home Norco 10-325 q4h PRN and pregabalin 150 mg TID for pain  HTN: Patient mildly hypertensive today. Per chart review patient on hydralazine 50 mg, metoprolol 200 mg BID, losartan 100 mg, lasix 20 mg, and amlodipine 10 mg daily. -Amlodipine 10 mg, losartan started today  Hx of HLD, PAD: Patient's extremities warm and well perfused this AM.  -Resume home medications on discharge  Insulin dependent DM c/b neuropathy: Patient on metformin 1000mg  BID, glimepiride 4mg , and Lantus 30 units daily with SSI at meals  as an outpatient. Recent CBGs within hospital goal of <180 on current regimen. -CBG monitoring TID with meals, qHS -Continue Lantus 10 units, qHS and SSI with meals  Anxiety: Patient continued on home  carbamazepine 200 mg qAM and 300 mg qPM, Klonopin 1mg  qHS, and duloxetine 60mg  daily while inpatient. No acute change.  FEN/GI: -HH, CM diet -No IVF, replace electrolytes as needed -Famotidine qHS (home zantac on to formulary)  VTE Prophylaxis: Lovenox daily Code Status: Partial, do not intubate  Dispo: Anticipated discharge in approximately 0-1 day.   Thomasene Ripple, MD 03/05/2018, 7:25 AM Pager: [phone]

## 2018-03-05 NOTE — Progress Notes (Signed)
RN called [facility] and gave repot to [facility]. PT IV has been removed awaiting PTAR

## 2018-03-05 NOTE — Clinical Social Work Placement (Signed)
   CLINICAL SOCIAL WORK PLACEMENT  NOTE  Date:  03/05/2018  Patient Details  Name: Leslie [name] MRN: [id number] Date of Birth: [birth year]  Clinical Social Work is seeking post-discharge placement for this patient at the [place] level of care (*CSW will initial, date and re-position this form in  chart as items are completed):  Yes   Patient/family provided with [place] Work Department's list of facilities offering this level of care within the geographic area requested by the patient (or if unable, by the patient's family).  Yes   Patient/family informed of their freedom to choose among providers that offer the needed level of care, that participate in Medicare, Medicaid or managed care program needed by the patient, have an available bed and are willing to accept the patient.  Yes   Patient/family informed of [facility]'s ownership interest in [facility] and [facility], as well as of the fact that they are under no obligation to receive care at these facilities.  PASRR submitted to EDS on       PASRR number received on       Existing PASRR number confirmed on 03/05/18     FL2 transmitted to all facilities in geographic area requested by pt/family on       FL2 transmitted to all facilities within larger geographic area on 03/05/18     Patient informed that his/her managed care company has contracts with or will negotiate with certain facilities, including the following:            Patient/family informed of bed offers received.  Patient chooses bed at [facility] recommends and patient chooses bed at      Patient to be transferred to [facility] on 03/05/18.  Patient to be transferred to facility by PTAR     Patient family notified on 03/05/18 of transfer.  Name of family member notified:  pt responsible for self     PHYSICIAN       Additional Comment:     _______________________________________________ Normajean Baxter, LCSW 03/05/2018, 3:30 PM

## 2018-03-05 NOTE — NC FL2 (Signed)
[place] LEVEL OF CARE SCREENING TOOL     IDENTIFICATION  Patient Name: Leslie [name] Birthdate: Mar 04, 1946 Sex: male Admission Date (Current Location): 03/03/2018  [facility] and [place] Number:  Herbalist and Address:  [facility], [place] [address] [place], [place] [address]      Provider Number: [id number]  Attending Physician Name and Address:  Bartholomew Crews, MD  Relative Name and Phone Number:  [name], friend, [phone]    Current Level of Care: Hospital Recommended Level of Care: [place] Prior Approval Number:    Date Approved/Denied:   PASRR Number: [id number]  Discharge Plan: SNF    Current Diagnoses: Patient Active Problem List   Diagnosis Date Noted  . Cellulitis of foot without toes, right 03/03/2018  . History of transmetatarsal amputation of right foot ([place]) 02/19/2018  . Amputated toe of right foot ([place]) 01/22/2018  . Gangrene of right foot ([place])   . 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 Below knee amputation status ([place]) 04/11/2016  . History of tobacco abuse 03/31/2016  . Osteomyelitis of left foot ([place])   . 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 Septic arthritis of left foot ([place]) 03/27/2016  . Solitary pulmonary nodule 08/14/2015  . Precordial chest pain 08/13/2015  . Peripheral arterial disease ([place]) 04/18/2015  . 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 Right anterior knee pain 11/19/2012  . Toenail deformity 01/16/2012  . Gout   . Diabetic neuropathy ([place]) 03/31/2008  . ALCOHOLISM 03/31/2008  . Rosacea 03/31/2008  . GERD 01/31/2008  . COPD (chronic obstructive pulmonary disease) ([place]) 08/03/2007  . DM W/COMPLICATION NOS, TYPE II 05/08/2007  . OBESITY 05/08/2007  . HTN (hypertension) 05/08/2007  . HERNIATED  DISC 05/08/2007  . HISTORY OF ASBESTOS EXPOSURE 05/08/2007  . Chronic lower back pain on narcotics  06/16/2005  . HLD (hyperlipidemia) 03/22/2002    Orientation RESPIRATION BLADDER Height & Weight     Self, Time, Situation, Place  Normal Incontinent Weight:   Height:     BEHAVIORAL SYMPTOMS/MOOD NEUROLOGICAL BOWEL NUTRITION STATUS      Continent Diet(Carb modified, thin liquids)  AMBULATORY STATUS COMMUNICATION OF NEEDS Skin   Extensive Assist Verbally Surgical wounds, Other (Comment)(Transtibial amputation)                       Personal Care Assistance Level of Assistance  Bathing, Feeding, Dressing Bathing Assistance: Maximum assistance Feeding assistance: Limited assistance Dressing Assistance: Maximum assistance     Functional Limitations Info  Sight, Hearing, Speech Sight Info: Adequate Hearing Info: Adequate Speech Info: Adequate    SPECIAL CARE FACTORS FREQUENCY  OT (By licensed OT), PT (By licensed PT)     PT Frequency: weekly OT Frequency: weekly            Contractures Contractures Info: Not present    Additional Factors Info  Code Status, Allergies Code Status Info: full  Allergies Info: STATINS, FLEXERIL CYCLOBENZAPRINE            Current Medications (03/05/2018):  This is the current hospital active medication list Current Facility-Administered Medications  Medication Dose Route Frequency Provider Last Rate Last Dose  . acetaminophen (TYLENOL) tablet 650 mg  650 mg Oral Q6H PRN Zada Finders, MD   650 mg at 03/03/18 2327   Or  . acetaminophen (TYLENOL)  suppository 650 mg  650 mg Rectal Q6H PRN Zada Finders, MD      . albuterol (PROVENTIL) (2.5 MG/3ML) 0.083% nebulizer solution 1.5 mL  1.5 mL Nebulization Q6H PRN Zada Finders, MD      . allopurinol (ZYLOPRIM) tablet 100 mg  100 mg Oral Daily Zada Finders, MD   100 mg at 03/05/18 0948  . amLODipine (NORVASC) tablet 10 mg  10 mg Oral Daily Zada Finders, MD   10 mg at 03/05/18 0948  .  carbamazepine (TEGRETOL) tablet 200 mg  200 mg Oral Daily Bartholomew Crews, MD   200 mg at 03/05/18 0948  . carbamazepine (TEGRETOL) tablet 300 mg  300 mg Oral QHS Bartholomew Crews, MD   300 mg at 03/04/18 2117  . ceFAZolin (ANCEF) IVPB 1 g/50 mL premix  1 g Intravenous Q8H Millen, Jessica B, RPH 100 mL/hr at 03/05/18 1251 1 g at 03/05/18 1251  . clonazePAM (KLONOPIN) tablet 1 mg  1 mg Oral QHS Zada Finders, MD   1 mg at 03/04/18 2117  . DULoxetine (CYMBALTA) DR capsule 60 mg  60 mg Oral Daily Zada Finders, MD   60 mg at 03/05/18 0947  . enoxaparin (LOVENOX) injection 40 mg  40 mg Subcutaneous Q24H Zada Finders, MD   40 mg at 03/04/18 2100  . famotidine (PEPCID) tablet 40 mg  40 mg Oral QHS Zada Finders, MD   40 mg at 03/04/18 2116  . HYDROcodone-acetaminophen (NORCO) 10-325 MG per tablet 1 tablet  1 tablet Oral Q4H PRN Zada Finders, MD      . insulin aspart (novoLOG) injection 0-9 Units  0-9 Units Subcutaneous TID WC Zada Finders, MD   3 Units at 03/05/18 1250  . insulin glargine (LANTUS) injection 10 Units  10 Units Subcutaneous QHS Zada Finders, MD   10 Units at 03/04/18 2118  . losartan (COZAAR) tablet 100 mg  100 mg Oral Daily Lorella Nimrod, MD   100 mg at 03/05/18 0947  . pregabalin (LYRICA) capsule 150 mg  150 mg Oral TID Zada Finders, MD   150 mg at 03/05/18 1016  . senna-docusate (Senokot-S) tablet 1 tablet  1 tablet Oral QHS PRN Zada Finders, MD         Discharge Medications: Please see discharge summary for a list of discharge medications.  Relevant Imaging Results:  Relevant Lab Results:   Additional Information SSN: [id number]  Normajean Baxter, LCSW

## 2018-03-05 NOTE — Social Work (Signed)
CSW called [facility] and left message for [name] [phone], case manager about disposition.  CSW also left message for SNF-[facility].  CSW f/u.  Elissa Hefty, LCSW Clinical Social Worker [phone]

## 2018-03-05 NOTE — Progress Notes (Signed)
  Date: 03/05/2018  Patient name: Leslie [name]  Medical record number: [id number]  Date of birth: [birth year]   I have seen and evaluated this patient and I have discussed the plan of care with the house staff. Please see their note for complete details. I concur with their findings with the following additions/corrections: I agree with Dr Gevena Barre plan to discuss possible D/C with Dr Sharol Given on Po ABX.   Bartholomew Crews, MD 03/05/2018, 1:29 PM

## 2018-03-05 NOTE — Social Work (Addendum)
Clinical Social Worker facilitated patient discharge including contacting patient family and facility to confirm patient discharge plans.  Clinical information faxed to facility and family agreeable with plan.    CSW arranged ambulance transport via [facility] to [facility].    RN to call [phone] to give report prior to discharge. Pt going to Room 320.  Clinical Social Worker will sign off for now as social work intervention is no longer needed. Please consult [place] again if new need arises.  Elissa Hefty, LCSW Clinical Social Worker [phone]

## 2018-03-05 NOTE — Plan of Care (Signed)
  Problem: Pain Managment: Goal: General experience of comfort will improve Outcome: Progressing   Problem: Safety: Goal: Ability to remain free from injury will improve Outcome: Progressing   

## 2018-03-08 ENCOUNTER — Encounter (INDEPENDENT_AMBULATORY_CARE_PROVIDER_SITE_OTHER): Payer: Self-pay | Admitting: Orthopedic Surgery

## 2018-03-08 ENCOUNTER — Ambulatory Visit (INDEPENDENT_AMBULATORY_CARE_PROVIDER_SITE_OTHER): Payer: Medicare (Managed Care) | Admitting: Orthopedic Surgery

## 2018-03-08 VITALS — Ht 64.0 in | Wt 233.0 lb

## 2018-03-08 DIAGNOSIS — T8781 Dehiscence of amputation stump: Secondary | ICD-10-CM

## 2018-03-08 LAB — CULTURE, BLOOD (ROUTINE X 2)
CULTURE: NO GROWTH
Culture: NO GROWTH
SPECIAL REQUESTS: ADEQUATE
Special Requests: ADEQUATE

## 2018-03-08 NOTE — Progress Notes (Signed)
Office Visit Note   Patient: Leslie [name]           Date of Birth: [birth year]           MRN: [id number] Visit Date: 03/08/2018              Requested by: [name], MD [address] [place], [place] [address] PCP: [name], MD  Chief Complaint  Patient presents with  . Right Foot - Routine Post Op    02/19/18 right transmet amputation       HPI: Patient is a 72 year old gentleman status post transmetatarsal amputation on the right.  He is currently at skilled nursing.  Patient states he is not elevating his foot and it does hang dependent.  Assessment & Plan: Visit Diagnoses:  1. Dehiscence of amputation stump ([place])     Plan: Orders were written for patient's foot to be elevated level with his heart washing the foot with soap and water daily 4 x 4 and Ace wrap for compression nonweightbearing on the right foot.  Reevaluate in 2 weeks.  Follow-Up Instructions: Return in about 2 weeks (around 03/22/2018).   Ortho Exam  Patient is alert, oriented, no adenopathy, well-dressed, normal affect, normal respiratory effort. Examination there is slight wound dehiscence with about a 5 mm gap.  There is no black gangrenous changes no cellulitis no odor no drainage no signs of infection.  Imaging: No results found. No images are attached to the encounter.  Labs: Lab Results  Component Value Date   HGBA1C 7.1 (H) 02/19/2018   HGBA1C 7.2 (H) 03/27/2016   HGBA1C 6.9 11/19/2012   ESRSEDRATE 90 (H) 03/27/2016   CRP 23.4 (H) 03/27/2016   LABURIC 4.8 08/09/2012   REPTSTATUS 03/08/2018 FINAL 03/03/2018   CULT  03/03/2018    NO GROWTH 5 DAYS Performed at [facility], [place] [address] [place], [place] [address]      Lab Results  Component Value Date   ALBUMIN 2.9 (L) 03/03/2018   ALBUMIN 2.6 (L) 02/19/2018   ALBUMIN 2.4 (L) 01/22/2018   PREALBUMIN 13.8 (L) 03/27/2016   LABURIC 4.8 08/09/2012    Body mass index is 39.99 kg/m.  Orders:  No orders of  the defined types were placed in this encounter.  No orders of the defined types were placed in this encounter.    Procedures: No procedures performed  Clinical Data: No additional findings.  ROS:  All other systems negative, except as noted in the HPI. Review of Systems  Objective: Vital Signs: Ht 5\' 4"  (1.626 m)   Wt 233 lb (105.7 kg)   BMI 39.99 kg/m   Specialty Comments:  No specialty comments available.  PMFS History: Patient Active Problem List   Diagnosis Date Noted  . Cellulitis of foot without toes, right 03/03/2018  . History of transmetatarsal amputation of right foot ([place]) 02/19/2018  . Amputated toe of right foot ([place]) 01/22/2018  . Gangrene of right foot ([place])   . 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 Below knee amputation status ([place]) 04/11/2016  . History of tobacco abuse 03/31/2016  . Osteomyelitis of left foot ([place])   . 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 Septic arthritis of left foot ([place]) 03/27/2016  . Solitary pulmonary nodule 08/14/2015  . Precordial chest pain 08/13/2015  . Peripheral arterial disease ([place])  04/18/2015  . 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 Right anterior knee pain 11/19/2012  . Toenail deformity 01/16/2012  . Gout   . Diabetic neuropathy ([place]) 03/31/2008  . ALCOHOLISM 03/31/2008  . Rosacea 03/31/2008  . GERD 01/31/2008  . COPD (chronic obstructive pulmonary disease) ([place]) 08/03/2007  . DM W/COMPLICATION NOS, TYPE II 05/08/2007  . OBESITY 05/08/2007  . HTN (hypertension) 05/08/2007  . 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 HISTORY OF ASBESTOS EXPOSURE 05/08/2007  . Chronic lower back pain on narcotics  06/16/2005  . HLD (hyperlipidemia) 03/22/2002   Past Medical History:  Diagnosis Date  . Anxiety    Panic attack - 10  years aago- ? panic   . Asthma   . Cellulitis  of left foot 2016   Healed and recurred 01/2016  . Cellulitis of right foot 06/30/2012  . Chronic lower back pain 07/01/2012  . COPD (chronic obstructive pulmonary disease) (HCC)    Uses nebulizers at home. Former smoker, current chewing tobacco. 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  Marland Kitchen GERD (gastroesophageal reflux disease)   . Gouty arthritis 07/01/2012   Not attacks in ~5 yrs (03/2016)  . Hepatitis 1967   "in jail when I got it; dr said it was pretty bad; quaranteened X 30d"  . Hyperlipidemia   . Hypertension   . Lung nodule, solitary 07/2015   Due for 1-yr recheck in 07/2017  . Peripheral arterial disease ([place])    critical limb ischemia  . Peripheral neuropathy 07/01/2012  . Seasonal allergies   . Shortness of breath dyspnea   . Skin growth 07/01/2012   right side of nares; "been on there 20 years"  . Substance abuse ([place])   . Type II diabetes mellitus ([place]) 2002   Diagnosed in 2002    Family History  Problem Relation Age of Onset  . Diabetes Sister   . Hypertension Mother     Past Surgical History:  Procedure Laterality Date  . AMPUTATION Left 03/28/2016   Procedure: AMPUTATION left foot 4th and 5 th ray;  Surgeon: Newt Minion, MD;  Location: [facility];  Service: Orthopedics;  Laterality: Left;  . AMPUTATION Left 04/11/2016   Procedure: LEFT BELOW KNEE AMPUTATION;  Surgeon: Newt Minion, MD;  Location: [facility];  Service: Orthopedics;  Laterality: Left;  . AMPUTATION Right 01/22/2018   Procedure: RIGHT FOOT 3RD RAY AMPUTATION;  Surgeon: Newt Minion, MD;  Location: [facility];  Service: Orthopedics;  Laterality: Right;  . AMPUTATION Right 02/19/2018   Procedure: RIGHT TRANSMETATARSAL AMPUTATION;  Surgeon: Newt Minion, MD;  Location: [facility];  Service: Orthopedics;  Laterality: Right;  . LEG AMPUTATION BELOW KNEE Left 04/11/2016  . LOWER EXTREMITY ANGIOGRAM N/A 04/18/2013   Procedure: LOWER EXTREMITY ANGIOGRAM;  Surgeon: Sherren Mocha, MD;  Location[facility] C[facility] LAB;  Service: Cardiovascular;  Laterality: N/A;  .  LOWER EXTREMITY ANGIOGRAM  08/13/2015   bilateral iliac   . PERIPHERAL VASCULAR CATHETERIZATION Bilateral 08/13/2015   Procedure: Lower Extremity Angiography;  Surgeon: Lorretta Harp, MD;  Location: [facility] CV LAB;  Service: Cardiovascular;  Laterality: Bilateral;  . PERIPHERAL VASCULAR CATHETERIZATION N/A 08/13/2015   Procedure: Abdominal Aortogram;  Surgeon: Lorretta Harp, MD;  Location: [place] [facility] LAB;  Service: Cardiovascular;  Laterality: N/A;  . TOE AMPUTATION Right 01/22/2018   3RD TOE RIGHT FOOT   Social History   Occupational History  . Occupation: disabled  Tobacco Use  . Smoking status: Former Smoker    Packs/day: 2.00    Years: 40.00  Pack years: 80.00    Types: Cigarettes    Last attempt to quit: 01/13/2002    Years since quitting: 16.1  . Smokeless tobacco: Current User    Types: Chew  . Tobacco comment: chews a little tobacco  Substance and Sexual Activity  . Alcohol use: Not Currently    Comment: 03/03/18: "Used to drink alot. No alcohol in a long time."  . Drug use: No    Types: Marijuana    Comment: 03/27/2016 "last marijuana was maybe in 2013"  . Sexual activity: Never

## 2018-03-18 ENCOUNTER — Ambulatory Visit (INDEPENDENT_AMBULATORY_CARE_PROVIDER_SITE_OTHER): Payer: Medicare (Managed Care) | Admitting: Orthopedic Surgery

## 2018-03-18 ENCOUNTER — Encounter (INDEPENDENT_AMBULATORY_CARE_PROVIDER_SITE_OTHER): Payer: Self-pay | Admitting: Orthopedic Surgery

## 2018-03-18 DIAGNOSIS — T8781 Dehiscence of amputation stump: Secondary | ICD-10-CM

## 2018-03-18 NOTE — Progress Notes (Signed)
Office Visit Note   Patient: Leslie [name]           Date of Birth: 11-03-1945           MRN: [id number] Visit Date: 03/18/2018              Requested by: [name], MD [address] [place], [place] [address] PCP: [name], MD  Chief Complaint  Patient presents with  . Right Foot - Follow-up, Routine Post Op      HPI: Patient is a 72 year old gentleman who is currently staying at [facility] skilled nursing facility.  He is status post a left transtibial amputation and has undergone attempted foot salvage intervention for a right transmetatarsal amputation.  Patient has had progressive dehiscence pain drainage bleeding.  Assessment & Plan: Visit Diagnoses:  1. Dehiscence of amputation stump (HCC)     Plan: Due to failure of foot salvage intervention for the right foot patient will need to proceed with a transtibial amputation on the right.  We will set this up for him next week continue with wound care that he is currently undergoing.  We will plan for transtibial amputation on the right with return to skilled nursing approximately 3 to 5 days after surgery.  Follow-Up Instructions: Return in about 3 weeks (around 04/08/2018).   Ortho Exam  Patient is alert, oriented, no adenopathy, well-dressed, normal affect, normal respiratory effort. Examination patient has had progressive wound dehiscence with ischemic changes around the wound edges.  There is no ascending cellulitis he does have venous stasis swelling.  Imaging: No results found. No images are attached to the encounter.  Labs: Lab Results  Component Value Date   HGBA1C 7.1 (H) 02/19/2018   HGBA1C 7.2 (H) 03/27/2016   HGBA1C 6.9 11/19/2012   ESRSEDRATE 90 (H) 03/27/2016   CRP 23.4 (H) 03/27/2016   LABURIC 4.8 08/09/2012   REPTSTATUS 03/08/2018 FINAL 03/03/2018   CULT  03/03/2018    NO GROWTH 5 DAYS Performed at [facility], [place] [address], [place], [place] [address]      Lab Results   Component Value Date   ALBUMIN 2.9 (L) 03/03/2018   ALBUMIN 2.6 (L) 02/19/2018   ALBUMIN 2.4 (L) 01/22/2018   PREALBUMIN 13.8 (L) 03/27/2016   LABURIC 4.8 08/09/2012    There is no height or weight on file to calculate BMI.  Orders:  No orders of the defined types were placed in this encounter.  No orders of the defined types were placed in this encounter.    Procedures: No procedures performed  Clinical Data: No additional findings.  ROS:  All other systems negative, except as noted in the HPI. Review of Systems  Objective: Vital Signs: There were no vitals taken for this visit.  Specialty Comments:  No specialty comments available.  PMFS History: Patient Active Problem List   Diagnosis Date Noted  . Cellulitis of foot without toes, right 03/03/2018  . History of transmetatarsal amputation of right foot ([place]) 02/19/2018  . Amputated toe of right foot ([place]) 01/22/2018  . Gangrene of right foot ([place])   . 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 Below knee amputation status ([place]) 04/11/2016  . History of tobacco abuse 03/31/2016  . Osteomyelitis of left foot ([place])   . 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 Septic arthritis of left  foot ([place]) 03/27/2016  . Solitary pulmonary nodule 08/14/2015  . Precordial chest pain 08/13/2015  . Peripheral arterial disease ([place]) 04/18/2015  . 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 Right anterior knee pain 11/19/2012  . Toenail deformity 01/16/2012  . Gout   . Diabetic neuropathy ([place]) 03/31/2008  . ALCOHOLISM 03/31/2008  . Rosacea 03/31/2008  . GERD 01/31/2008  . COPD (chronic obstructive pulmonary disease) ([place]) 08/03/2007  . DM W/COMPLICATION NOS, TYPE II 05/08/2007  . OBESITY 05/08/2007  . HTN (hypertension) 05/08/2007  . 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 HISTORY OF  ASBESTOS EXPOSURE 05/08/2007  . Chronic lower back pain on narcotics  06/16/2005  . HLD (hyperlipidemia) 03/22/2002   Past Medical History:  Diagnosis Date  . Anxiety    Panic attack - 10  years aago- ? panic   . Asthma   . Cellulitis of left foot 2016   Healed and recurred 01/2016  . Cellulitis of right foot 06/30/2012  . Chronic lower back pain 07/01/2012  . COPD (chronic obstructive pulmonary disease) (HCC)    Uses nebulizers at home. Former smoker, current chewing tobacco. 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  Marland Kitchen GERD (gastroesophageal reflux disease)   . Gouty arthritis 07/01/2012   Not attacks in ~5 yrs (03/2016)  . Hepatitis 1967   "in jail when I got it; dr said it was pretty bad; quaranteened X 30d"  . Hyperlipidemia   . Hypertension   . Lung nodule, solitary 07/2015   Due for 1-yr recheck in 07/2017  . Peripheral arterial disease ([place])    critical limb ischemia  . Peripheral neuropathy 07/01/2012  . Seasonal allergies   . Shortness of breath dyspnea   . Skin growth 07/01/2012   right side of nares; "been on there 20 years"  . Substance abuse ([place])   . Type II diabetes mellitus ([place]) 2002   Diagnosed in 2002    Family History  Problem Relation Age of Onset  . Diabetes Sister   . Hypertension Mother     Past Surgical History:  Procedure Laterality Date  . AMPUTATION Left 03/28/2016   Procedure: AMPUTATION left foot 4th and 5 th ray;  Surgeon: Newt Minion, MD;  Location: [facility];  Service: Orthopedics;  Laterality: Left;  . AMPUTATION Left 04/11/2016   Procedure: LEFT BELOW KNEE AMPUTATION;  Surgeon: Newt Minion, MD;  Location: [facility];  Service: Orthopedics;  Laterality: Left;  . AMPUTATION Right 01/22/2018   Procedure: RIGHT FOOT 3RD RAY AMPUTATION;  Surgeon: Newt Minion, MD;  Location: [facility];  Service: Orthopedics;  Laterality: Right;  . AMPUTATION Right 02/19/2018   Procedure: RIGHT TRANSMETATARSAL AMPUTATION;  Surgeon: Newt Minion, MD;  Location: [facility];  Service: Orthopedics;   Laterality: Right;  . LEG AMPUTATION BELOW KNEE Left 04/11/2016  . LOWER EXTREMITY ANGIOGRAM N/A 04/18/2013   Procedure: LOWER EXTREMITY ANGIOGRAM;  Surgeon: Sherren Mocha, MD;  Location: [facility] C[facility] LAB;  Service: Cardiovascular;  Laterality: N/A;  . LOWER EXTREMITY ANGIOGRAM  08/13/2015   bilateral iliac   . PERIPHERAL VASCULAR CATHETERIZATION Bilateral 08/13/2015   Procedure: Lower Extremity Angiography;  Surgeon: Lorretta Harp, MD;  Location: [facility] LAB;  Service: Cardiovascular;  Laterality: Bilateral;  . PERIPHERAL VASCULAR CATHETERIZATION N/A 08/13/2015   Procedure: Abdominal Aortogram;  Surgeon: Lorretta Harp, MD;  Location: [facility] LAB;  Service: Cardiovascular;  Laterality: N/A;  . TOE AMPUTATION Right 01/22/2018   3RD TOE RIGHT FOOT   Social History   Occupational History  . Occupation: disabled  Tobacco Use  . Smoking status: Former Smoker    Packs/day: 2.00    Years: 40.00    Pack years: 80.00    Types: Cigarettes    Last attempt to quit: 01/13/2002    Years since quitting: 16.1  . Smokeless tobacco: Current User    Types: Chew  . Tobacco comment: chews a little tobacco  Substance and Sexual Activity  . Alcohol use: Not Currently    Comment: 03/03/18: "Used to drink alot. No alcohol in a long time."  . Drug use: No    Types: Marijuana    Comment: 03/27/2016 "last marijuana was maybe in 2013"  . Sexual activity: Never

## 2018-03-23 ENCOUNTER — Telehealth (INDEPENDENT_AMBULATORY_CARE_PROVIDER_SITE_OTHER): Payer: Self-pay | Admitting: Orthopedic Surgery

## 2018-03-24 ENCOUNTER — Ambulatory Visit (INDEPENDENT_AMBULATORY_CARE_PROVIDER_SITE_OTHER): Payer: Medicare (Managed Care) | Admitting: Orthopedic Surgery

## 2018-03-26 ENCOUNTER — Inpatient Hospital Stay (HOSPITAL_COMMUNITY)
Admission: EM | Admit: 2018-03-26 | Discharge: 2018-03-30 | DRG: 474 | Disposition: A | Discharge status: Skilled Nursing Facility | Payer: Medicare (Managed Care) | Attending: Internal Medicine | Admitting: Internal Medicine

## 2018-03-26 ENCOUNTER — Emergency Department (HOSPITAL_COMMUNITY): Payer: Medicare (Managed Care)

## 2018-03-26 ENCOUNTER — Encounter (HOSPITAL_COMMUNITY): Payer: Self-pay | Admitting: Emergency Medicine

## 2018-03-26 DIAGNOSIS — J302 Other seasonal allergic rhinitis: Secondary | ICD-10-CM | POA: Diagnosis present

## 2018-03-26 DIAGNOSIS — E872 Acidosis: Secondary | ICD-10-CM | POA: Diagnosis present

## 2018-03-26 DIAGNOSIS — J449 Chronic obstructive pulmonary disease, unspecified: Secondary | ICD-10-CM | POA: Diagnosis present

## 2018-03-26 DIAGNOSIS — A4150 Gram-negative sepsis, unspecified: Secondary | ICD-10-CM | POA: Diagnosis not present

## 2018-03-26 DIAGNOSIS — K219 Gastro-esophageal reflux disease without esophagitis: Secondary | ICD-10-CM | POA: Diagnosis present

## 2018-03-26 DIAGNOSIS — M545 Low back pain: Secondary | ICD-10-CM | POA: Diagnosis present

## 2018-03-26 DIAGNOSIS — Y848 Other medical procedures as the cause of abnormal reaction of the patient, or of later complication, without mention of misadventure at the time of the procedure: Secondary | ICD-10-CM | POA: Diagnosis present

## 2018-03-26 DIAGNOSIS — E785 Hyperlipidemia, unspecified: Secondary | ICD-10-CM | POA: Diagnosis present

## 2018-03-26 DIAGNOSIS — Y835 Amputation of limb(s) as the cause of abnormal reaction of the patient, or of later complication, without mention of misadventure at the time of the procedure: Secondary | ICD-10-CM | POA: Diagnosis present

## 2018-03-26 DIAGNOSIS — G8929 Other chronic pain: Secondary | ICD-10-CM | POA: Diagnosis present

## 2018-03-26 DIAGNOSIS — T148XXA Other injury of unspecified body region, initial encounter: Secondary | ICD-10-CM

## 2018-03-26 DIAGNOSIS — T8130XA Disruption of wound, unspecified, initial encounter: Secondary | ICD-10-CM | POA: Diagnosis not present

## 2018-03-26 DIAGNOSIS — E1151 Type 2 diabetes mellitus with diabetic peripheral angiopathy without gangrene: Secondary | ICD-10-CM | POA: Diagnosis present

## 2018-03-26 DIAGNOSIS — A419 Sepsis, unspecified organism: Secondary | ICD-10-CM

## 2018-03-26 DIAGNOSIS — B9689 Other specified bacterial agents as the cause of diseases classified elsewhere: Secondary | ICD-10-CM | POA: Diagnosis not present

## 2018-03-26 DIAGNOSIS — E876 Hypokalemia: Secondary | ICD-10-CM | POA: Diagnosis present

## 2018-03-26 DIAGNOSIS — T8781 Dehiscence of amputation stump: Principal | ICD-10-CM

## 2018-03-26 DIAGNOSIS — N179 Acute kidney failure, unspecified: Secondary | ICD-10-CM | POA: Diagnosis present

## 2018-03-26 DIAGNOSIS — D509 Iron deficiency anemia, unspecified: Secondary | ICD-10-CM | POA: Diagnosis not present

## 2018-03-26 DIAGNOSIS — E43 Unspecified severe protein-calorie malnutrition: Secondary | ICD-10-CM

## 2018-03-26 DIAGNOSIS — I10 Essential (primary) hypertension: Secondary | ICD-10-CM | POA: Diagnosis present

## 2018-03-26 DIAGNOSIS — E1140 Type 2 diabetes mellitus with diabetic neuropathy, unspecified: Secondary | ICD-10-CM | POA: Diagnosis present

## 2018-03-26 DIAGNOSIS — Z77090 Contact with and (suspected) exposure to asbestos: Secondary | ICD-10-CM | POA: Diagnosis present

## 2018-03-26 DIAGNOSIS — D638 Anemia in other chronic diseases classified elsewhere: Secondary | ICD-10-CM | POA: Diagnosis not present

## 2018-03-26 DIAGNOSIS — M86171 Other acute osteomyelitis, right ankle and foot: Secondary | ICD-10-CM | POA: Diagnosis not present

## 2018-03-26 DIAGNOSIS — L03115 Cellulitis of right lower limb: Secondary | ICD-10-CM | POA: Diagnosis not present

## 2018-03-26 DIAGNOSIS — R911 Solitary pulmonary nodule: Secondary | ICD-10-CM | POA: Diagnosis present

## 2018-03-26 DIAGNOSIS — Z794 Long term (current) use of insulin: Secondary | ICD-10-CM

## 2018-03-26 DIAGNOSIS — E1142 Type 2 diabetes mellitus with diabetic polyneuropathy: Secondary | ICD-10-CM | POA: Diagnosis present

## 2018-03-26 DIAGNOSIS — E1169 Type 2 diabetes mellitus with other specified complication: Secondary | ICD-10-CM | POA: Diagnosis present

## 2018-03-26 DIAGNOSIS — Z79899 Other long term (current) drug therapy: Secondary | ICD-10-CM

## 2018-03-26 DIAGNOSIS — Z7982 Long term (current) use of aspirin: Secondary | ICD-10-CM

## 2018-03-26 DIAGNOSIS — E669 Obesity, unspecified: Secondary | ICD-10-CM | POA: Diagnosis present

## 2018-03-26 DIAGNOSIS — J9602 Acute respiratory failure with hypercapnia: Secondary | ICD-10-CM | POA: Diagnosis not present

## 2018-03-26 DIAGNOSIS — M869 Osteomyelitis, unspecified: Secondary | ICD-10-CM | POA: Diagnosis present

## 2018-03-26 DIAGNOSIS — J9601 Acute respiratory failure with hypoxia: Secondary | ICD-10-CM | POA: Diagnosis not present

## 2018-03-26 DIAGNOSIS — M86271 Subacute osteomyelitis, right ankle and foot: Secondary | ICD-10-CM | POA: Diagnosis not present

## 2018-03-26 DIAGNOSIS — A4159 Other Gram-negative sepsis: Secondary | ICD-10-CM | POA: Diagnosis present

## 2018-03-26 DIAGNOSIS — Z89512 Acquired absence of left leg below knee: Secondary | ICD-10-CM

## 2018-03-26 DIAGNOSIS — Z833 Family history of diabetes mellitus: Secondary | ICD-10-CM

## 2018-03-26 DIAGNOSIS — R5383 Other fatigue: Secondary | ICD-10-CM | POA: Diagnosis not present

## 2018-03-26 DIAGNOSIS — L089 Local infection of the skin and subcutaneous tissue, unspecified: Secondary | ICD-10-CM | POA: Diagnosis not present

## 2018-03-26 DIAGNOSIS — R5084 Febrile nonhemolytic transfusion reaction: Secondary | ICD-10-CM | POA: Diagnosis not present

## 2018-03-26 DIAGNOSIS — I959 Hypotension, unspecified: Secondary | ICD-10-CM | POA: Diagnosis not present

## 2018-03-26 DIAGNOSIS — F419 Anxiety disorder, unspecified: Secondary | ICD-10-CM | POA: Diagnosis present

## 2018-03-26 DIAGNOSIS — M109 Gout, unspecified: Secondary | ICD-10-CM | POA: Diagnosis present

## 2018-03-26 DIAGNOSIS — D649 Anemia, unspecified: Secondary | ICD-10-CM

## 2018-03-26 DIAGNOSIS — F17220 Nicotine dependence, chewing tobacco, uncomplicated: Secondary | ICD-10-CM | POA: Diagnosis present

## 2018-03-26 DIAGNOSIS — E8771 Transfusion associated circulatory overload: Secondary | ICD-10-CM | POA: Diagnosis not present

## 2018-03-26 DIAGNOSIS — Z888 Allergy status to other drugs, medicaments and biological substances status: Secondary | ICD-10-CM

## 2018-03-26 DIAGNOSIS — Z8249 Family history of ischemic heart disease and other diseases of the circulatory system: Secondary | ICD-10-CM

## 2018-03-26 DIAGNOSIS — R7881 Bacteremia: Secondary | ICD-10-CM

## 2018-03-26 DIAGNOSIS — R0603 Acute respiratory distress: Secondary | ICD-10-CM

## 2018-03-26 DIAGNOSIS — Z6836 Body mass index (BMI) 36.0-36.9, adult: Secondary | ICD-10-CM

## 2018-03-26 LAB — PREPARE RBC (CROSSMATCH)

## 2018-03-26 LAB — COMPREHENSIVE METABOLIC PANEL
ALT: 41 U/L (ref 17–63)
AST: 38 U/L (ref 15–41)
Albumin: 2.1 g/dL — ABNORMAL LOW (ref 3.5–5.0)
Alkaline Phosphatase: 120 U/L (ref 38–126)
Anion gap: 13 (ref 5–15)
BUN: 38 mg/dL — AB (ref 6–20)
CHLORIDE: 97 mmol/L — AB (ref 101–111)
CO2: 23 mmol/L (ref 22–32)
CREATININE: 2.13 mg/dL — AB (ref 0.61–1.24)
Calcium: 8.8 mg/dL — ABNORMAL LOW (ref 8.9–10.3)
GFR, EST AFRICAN AMERICAN: 34 mL/min — AB (ref >60)
GFR, EST NON AFRICAN AMERICAN: 29 mL/min — AB (ref >60)
Glucose, Bld: 196 mg/dL — ABNORMAL HIGH (ref 65–99)
POTASSIUM: 4.1 mmol/L (ref 3.5–5.1)
SODIUM: 133 mmol/L — AB (ref 135–145)
Total Bilirubin: 0.5 mg/dL (ref 0.3–1.2)
Total Protein: 7.4 g/dL (ref 6.5–8.1)

## 2018-03-26 LAB — URINALYSIS, ROUTINE W REFLEX MICROSCOPIC
BILIRUBIN URINE: NEGATIVE
GLUCOSE, UA: NEGATIVE mg/dL
HGB URINE DIPSTICK: NEGATIVE
KETONES UR: NEGATIVE mg/dL
LEUKOCYTES UA: NEGATIVE
NITRITE: NEGATIVE
PH: 5 (ref 5.0–8.0)
Protein, ur: 30 mg/dL — AB
SPECIFIC GRAVITY, URINE: 1.016 (ref 1.005–1.030)

## 2018-03-26 LAB — CBC WITH DIFFERENTIAL/PLATELET
ABS IMMATURE GRANULOCYTES: 0.2 10*3/uL — AB (ref 0.0–0.1)
BASOS ABS: 0.1 10*3/uL (ref 0.0–0.1)
BASOS PCT: 0 %
Eosinophils Absolute: 0 10*3/uL (ref 0.0–0.7)
Eosinophils Relative: 0 %
HCT: 21.8 % — ABNORMAL LOW (ref 39.0–52.0)
Hemoglobin: 6.6 g/dL — CL (ref 13.0–17.0)
IMMATURE GRANULOCYTES: 1 %
LYMPHS PCT: 10 %
Lymphs Abs: 2 10*3/uL (ref 0.7–4.0)
MCH: 24.6 pg — AB (ref 26.0–34.0)
MCHC: 30.3 g/dL (ref 30.0–36.0)
MCV: 81.3 fL (ref 78.0–100.0)
MONO ABS: 1.7 10*3/uL — AB (ref 0.1–1.0)
Monocytes Relative: 9 %
NEUTROS ABS: 15.9 10*3/uL — AB (ref 1.7–7.7)
Neutrophils Relative %: 80 %
PLATELETS: 443 10*3/uL — AB (ref 150–400)
RBC: 2.68 MIL/uL — ABNORMAL LOW (ref 4.22–5.81)
RDW: 16.8 % — ABNORMAL HIGH (ref 11.5–15.5)
WBC: 19.8 10*3/uL — ABNORMAL HIGH (ref 4.0–10.5)

## 2018-03-26 LAB — I-STAT CG4 LACTIC ACID, ED
LACTIC ACID, VENOUS: 2.58 mmol/L — AB (ref 0.5–1.9)
LACTIC ACID, VENOUS: 1.89 mmol/L (ref 0.5–1.9)

## 2018-03-26 LAB — GLUCOSE, CAPILLARY: Glucose-Capillary: 113 mg/dL — ABNORMAL HIGH (ref 65–99)

## 2018-03-26 LAB — PROTIME-INR
INR: 1.34
PROTHROMBIN TIME: 16.4 s — AB (ref 11.4–15.2)

## 2018-03-26 LAB — ABO/RH: ABO/RH(D): O NEG

## 2018-03-26 MED ORDER — ACETAMINOPHEN 650 MG RE SUPP
650.0000 mg | Freq: Four times a day (QID) | RECTAL | Status: DC | PRN
  Administered 2018-03-26: 650 mg via RECTAL
  Filled 2018-03-26: qty 1

## 2018-03-26 MED ORDER — CARBAMAZEPINE 200 MG PO TABS: 200.0000 mg | ORAL_TABLET | ORAL | Status: DC

## 2018-03-26 MED ORDER — DULOXETINE HCL 60 MG PO CPEP
60.0000 mg | ORAL_CAPSULE | Freq: Every day | ORAL | Status: DC
  Administered 2018-03-27 – 2018-03-30 (×4): 60 mg via ORAL
  Filled 2018-03-26 (×4): qty 1

## 2018-03-26 MED ORDER — SODIUM CHLORIDE 0.9 % IV BOLUS (SEPSIS)
1000.0000 mL | Freq: Once | INTRAVENOUS | Status: AC
  Administered 2018-03-26: 1000 mL via INTRAVENOUS

## 2018-03-26 MED ORDER — CLONAZEPAM 0.5 MG PO TABS
0.5000 mg | ORAL_TABLET | Freq: Every evening | ORAL | Status: DC | PRN
  Administered 2018-03-27: 0.5 mg via ORAL
  Filled 2018-03-26: qty 1

## 2018-03-26 MED ORDER — PIPERACILLIN-TAZOBACTAM 3.375 G IVPB: 3.3750 g | Freq: Three times a day (TID) | INTRAVENOUS | Status: DC

## 2018-03-26 MED ORDER — PIPERACILLIN-TAZOBACTAM 3.375 G IVPB 30 MIN
3.3750 g | Freq: Once | INTRAVENOUS | Status: AC
  Administered 2018-03-26: 3.375 g via INTRAVENOUS
  Filled 2018-03-26: qty 50

## 2018-03-26 MED ORDER — BIOTENE DRY MOUTH MT LIQD
10.0000 mL | Freq: Every day | OROMUCOSAL | Status: DC
  Administered 2018-03-26 – 2018-03-29 (×3): 10 mL via OROMUCOSAL

## 2018-03-26 MED ORDER — SODIUM CHLORIDE 0.9 % IV SOLN
1250.0000 mg | INTRAVENOUS | Status: DC
  Filled 2018-03-26: qty 1250

## 2018-03-26 MED ORDER — FAMOTIDINE 20 MG PO TABS
20.0000 mg | ORAL_TABLET | Freq: Every day | ORAL | Status: DC
  Administered 2018-03-27 – 2018-03-29 (×3): 20 mg via ORAL
  Filled 2018-03-26 (×3): qty 1

## 2018-03-26 MED ORDER — PREGABALIN 100 MG PO CAPS
100.0000 mg | ORAL_CAPSULE | Freq: Three times a day (TID) | ORAL | Status: DC
  Administered 2018-03-27 – 2018-03-30 (×11): 100 mg via ORAL
  Filled 2018-03-26 (×11): qty 1

## 2018-03-26 MED ORDER — CARBAMAZEPINE 200 MG PO TABS
300.0000 mg | ORAL_TABLET | Freq: Every day | ORAL | Status: DC
  Administered 2018-03-27 – 2018-03-29 (×3): 300 mg via ORAL
  Filled 2018-03-26 (×5): qty 1.5

## 2018-03-26 MED ORDER — VANCOMYCIN HCL 10 G IV SOLR
2000.0000 mg | Freq: Once | INTRAVENOUS | Status: AC
  Administered 2018-03-26: 2000 mg via INTRAVENOUS
  Filled 2018-03-26: qty 2000

## 2018-03-26 MED ORDER — SODIUM CHLORIDE 0.9 % IV SOLN: 10.0000 mL/h | Freq: Once | INTRAVENOUS | Status: DC

## 2018-03-26 MED ORDER — SODIUM CHLORIDE 0.9 % IV BOLUS (SEPSIS)
500.0000 mL | Freq: Once | INTRAVENOUS | Status: AC
  Administered 2018-03-26: 500 mL via INTRAVENOUS

## 2018-03-26 MED ORDER — ALLOPURINOL 100 MG PO TABS
100.0000 mg | ORAL_TABLET | Freq: Every day | ORAL | Status: DC
  Administered 2018-03-27 – 2018-03-30 (×4): 100 mg via ORAL
  Filled 2018-03-26 (×4): qty 1

## 2018-03-26 MED ORDER — HYDROCODONE-ACETAMINOPHEN 5-325 MG PO TABS: 1.0000 | ORAL_TABLET | ORAL | Status: DC | PRN

## 2018-03-26 MED ORDER — SODIUM CHLORIDE 0.9 % IV BOLUS
1000.0000 mL | Freq: Once | INTRAVENOUS | Status: AC
  Administered 2018-03-26: 1000 mL via INTRAVENOUS

## 2018-03-26 MED ORDER — CARBAMAZEPINE 200 MG PO TABS
200.0000 mg | ORAL_TABLET | Freq: Every day | ORAL | Status: DC
  Administered 2018-03-27 – 2018-03-30 (×4): 200 mg via ORAL
  Filled 2018-03-26 (×5): qty 1

## 2018-03-26 MED ORDER — ACETAMINOPHEN 325 MG PO TABS
650.0000 mg | ORAL_TABLET | Freq: Four times a day (QID) | ORAL | Status: DC | PRN
  Administered 2018-03-27: 650 mg via ORAL
  Filled 2018-03-26: qty 2

## 2018-03-26 MED ORDER — SODIUM CHLORIDE 0.9 % IV SOLN
2.0000 g | INTRAVENOUS | Status: DC
  Administered 2018-03-27 – 2018-03-28 (×3): 2 g via INTRAVENOUS
  Filled 2018-03-26 (×3): qty 2

## 2018-03-26 MED ORDER — VANCOMYCIN HCL IN DEXTROSE 1-5 GM/200ML-% IV SOLN: 1000.0000 mg | Freq: Once | INTRAVENOUS | Status: DC

## 2018-03-26 MED ORDER — ALBUTEROL SULFATE (2.5 MG/3ML) 0.083% IN NEBU: 2.5000 mg | INHALATION_SOLUTION | Freq: Four times a day (QID) | RESPIRATORY_TRACT | Status: DC | PRN

## 2018-03-26 NOTE — Progress Notes (Signed)
Pt's temp 98.6. Restart the blood transfusion per MD.

## 2018-03-26 NOTE — Progress Notes (Signed)
Night Team Interval Progress Note   Paged regarding fever during transfusion of second unit of blood, instructed nursing to stop transfusion for now and will see pt. On evaluation, pt was resting comfortably in bed. No increased work of breathing, and breath sounds clear. He was alert, oriented to self and following commands which is improved compared to admission exam. No urticaria or other acute rash present. Denied abdominal pain, chest pain, or other complaints. On recheck, pt temperature 98.6. Pt received O negative blood and tolerated first transfusion well. Discussed with blood blank who typically do not consider hemolytic workup unless temperature rises greater than 2 degrees. He appears improved from prior overall and doubt this fever represents an acute transfusion reaction and is more likely to be due to his ongoing osteo/wound infection. Will resume transfusion with close monitoring of vital signs or other change in status.   Trevor Iha, PGY-1  [phone]

## 2018-03-26 NOTE — Progress Notes (Signed)
Pharmacy Antibiotic Note  Leslie [name] is a 72 y.o. male presenting with hypotension and lethargy s/p partial R foot amputation, scheduled for R BKA in 2 weeks. Pharmacy consulted to dose vancomycin and Zosyn. WBC 19.8, current temp 99.6. Scr 2.13 (BL ~1), estimated normalized CrCl ~32 mL/min.  Plan: Vancomycin 2g IV x1, followed by vancomycin 1250mg  IV q24h Zosyn 3.375g IV q8h F/u C&S, clinical status, renal function, de-escalation, LOT, vancomycin levels as indicated  Weight: 233 lb (105.7 kg)  Temp (24hrs), Avg:99 F (37.2 C), Min:98.3 F (36.8 C), Max:99.6 F (37.6 C)  Recent Labs  Lab 03/26/18 1324 03/26/18 1344  WBC 19.8*  --   CREATININE 2.13*  --   LATICACIDVEN  --  2.58*    Estimated Creatinine Clearance: 34.5 mL/min (A) (by C-G formula based on SCr of 2.13 mg/dL (H)).    Allergies  Allergen Reactions  . Statins Other (See Comments)    "Allergic," per MAR  . Flexeril [Cyclobenzaprine] Other (See Comments)    Dry mouth and hallucinations    Antimicrobials this admission: Vanc 5/31 >>  Zosyn 5/31 >>   Dose adjustments this admission: None  Microbiology results: 5/31 BCx: pending  Thank you for allowing pharmacy to be a part of this patient's care.  Mila Merry Gerarda Fraction, PharmD PGY1 Pharmacy Resident Pager: [phone] 03/26/2018 3:00 PM

## 2018-03-26 NOTE — ED Triage Notes (Signed)
Per [facility] pt from [facility], partial right foot amputation two weeks ago and scheduled for right BKA in two weeks. EMS called due to patient being hypotensive and lethargic.

## 2018-03-26 NOTE — Progress Notes (Addendum)
Pharmacy Antibiotic Note  Leslie [name] is a 72 y.o. male presenting with hypotension and lethargy s/p partial R foot amputation, scheduled for R BKA in 2 weeks. Pharmacy consulted to dose vancomycin and cefepime. WBC 19.8, current temp 99.6. Scr 2.13 (BL ~1), estimated normalized CrCl ~32 mL/min.  Plan: Vancomycin 2g IV x1, followed by vancomycin 1250mg  IV q24h Cefepime 2g IV q24h F/u C&S, clinical status, renal function, de-escalation, LOT, vancomycin levels as indicated  Weight: 233 lb (105.7 kg)  Temp (24hrs), Avg:99.4 F (37.4 C), Min:98.3 F (36.8 C), Max:99.8 F (37.7 C)  Recent Labs  Lab 03/26/18 1324 03/26/18 1344 03/26/18 1553  WBC 19.8*  --   --   CREATININE 2.13*  --   --   LATICACIDVEN  --  2.58* 1.89    Estimated Creatinine Clearance: 34.5 mL/min (A) (by C-G formula based on SCr of 2.13 mg/dL (H)).    Allergies  Allergen Reactions  . Atorvastatin Other (See Comments)    "Allergic," per paperwork from facility  . Statins Other (See Comments)    "Allergic," per MAR  . Flexeril [Cyclobenzaprine] Other (See Comments)    Dry mouth and hallucinations    Antimicrobials this admission: Vanc 5/31 >>  Zosyn 5/31 x1 Cefepime 5/31 >>   Dose adjustments this admission: None  Microbiology results: 5/31 BCx: pending  Thank you for allowing pharmacy to be a part of this patient's care.  Mila Merry Gerarda Fraction, PharmD PGY1 Pharmacy Resident Pager: [phone] 03/26/2018 6:50 PM

## 2018-03-26 NOTE — H&P (Signed)
Date: 03/26/2018               Patient Name:  Leslie [name] MRN: [id number]  DOB: Aug 12, 1946 Age / Sex: 72 y.o., male   PCP: [name], MD         Medical Service: Internal Medicine Teaching Service         Attending Physician: Dr. Sid Falcon, MD    First Contact: Dr. Trilby Drummer Pager: [phone]  Second Contact: Dr. Danford Bad Pager: [phone]       After Hours (After 5p/  First Contact Pager: [phone]  weekends / holidays): Second Contact Pager: [phone]   Chief Complaint: wound infection  History of Present Illness:  Mr. Steve is a 72yo male who is a [facility] with PMH of PAD s/p L BKA, recent transmetatarsal amputation 02/22/2018, diabetes, and COPD/asthma who presents from [facility] for hypotension and lethargy. On my interview, patient remained slightly lethargic, thus much of the history was obtained from chart review.  He was recently admitted 5/8-5/10 for cellulitis of the surgical wound after his recent trans-metatarsal amputation on 4/29. X-ray was negative for osteomyelitis. He was evaluated by Orthopedics for possible dehiscence, however it was determined that he did not require surgical intervention. He received broad spectrum vancomycin and cefepime on admission, which was narrowed to cefazolin for coverage of non-purulent cellulitis. He was discharged to complete a 5-day course of cephalexin (completed 5/15). In his Orthopedic office visits on 5/13 and 5/23, he was noted to have progressing wound dehiscence. Due to failure of foot salvage intervention, he was scheduled for right transtibial amputation on 6/5.   He was seen at [facility] today and noted to be hypotensive and lethargic, thus he was transferred to the emergency room. He reports feeling lightheaded for the last day. He endorses mild pain in his right lower extremity. He denies any new changes or recent illness in the last day. He denies hematuria, hematemesis, or blood loss from the surgical wound. He is not sure  whether he has had hematemesis or melena, but he states that he does not check. Denies chest pain or shortness of breath.   He denies tobacco use, alcohol use, or other illicit substance use. He lives at home alone, and reports that he has some friends in the area.  ED Course: - BP 125/53, temp 98.3, HR 75, RR 16, O2 99% on RA - WBC 19.8, Hb 6.6. BUN 38, Cr 2.13. Lactic acid 2.58 -> 1.89. BCx pending. - CXR unremarkable. Right foot x-ray shows findings concerning for osteomyelitis, as well as soft tissue infection. - S/p 4.5L IV NS bolus and vanc/zosyn  Meds:  No outpatient medications have been marked as taking for the 03/26/18 encounter [facility] Encounter).   Allergies: Allergies as of 03/26/2018 - Review Complete 03/26/2018  Allergen Reaction Noted  . Atorvastatin Other (See Comments) 03/26/2018  . Statins Other (See Comments) 03/03/2018  . Flexeril [cyclobenzaprine] Other (See Comments) 07/23/2012   Past Medical History:  Diagnosis Date  . Anxiety    Panic attack - 10  years aago- ? panic   . Asthma   . Cellulitis of left foot 2016   Healed and recurred 01/2016  . Cellulitis of right foot 06/30/2012  . Chronic lower back pain 07/01/2012  . COPD (chronic obstructive pulmonary disease) (HCC)    Uses nebulizers at home. Former smoker, current chewing tobacco. 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  Marland Kitchen GERD (gastroesophageal reflux disease)   . Gouty arthritis 07/01/2012  Not attacks in ~5 yrs (03/2016)  . Hepatitis 1967   "in jail when I got it; dr said it was pretty bad; quaranteened X 30d"  . Hyperlipidemia   . Hypertension   . Lung nodule, solitary 07/2015   Due for 1-yr recheck in 07/2017  . Peripheral arterial disease ([place])    critical limb ischemia  . Peripheral neuropathy 07/01/2012  . Seasonal allergies   . Shortness of breath dyspnea   . Skin growth 07/01/2012   right side of nares; "been on there 20 years"  . Substance abuse ([place])   . Type II diabetes mellitus ([place]) 2002   Diagnosed in  2002   Family History:  Family History  Problem Relation Age of Onset  . Diabetes Sister   . Hypertension Mother    Social History:  Social History   Socioeconomic History  . Marital status: Widowed    Spouse name: Not on file  . Number of children: Not on file  . Years of education: Not on file  . Highest education level: Not on file  Occupational History  . Occupation: disabled  Social Needs  . Financial resource strain: Not on file  . Food insecurity:    Worry: Not on file    Inability: Not on file  . Transportation needs:    Medical: Not on file    Non-medical: Not on file  Tobacco Use  . Smoking status: Former Smoker    Packs/day: 2.00    Years: 40.00    Pack years: 80.00    Types: Cigarettes    Last attempt to quit: 01/13/2002    Years since quitting: 16.2  . Smokeless tobacco: Current User    Types: Chew  . Tobacco comment: chews a little tobacco  Substance and Sexual Activity  . Alcohol use: Not Currently    Comment: 03/03/18: "Used to drink alot. No alcohol in a long time."  . Drug use: No    Types: Marijuana    Comment: 03/27/2016 "last marijuana was maybe in 2013"  . Sexual activity: Never  Lifestyle  . Physical activity:    Days per week: Not on file    Minutes per session: Not on file  . Stress: Not on file  Relationships  . Social connections:    Talks on phone: Not on file    Gets together: Not on file    Attends religious service: Not on file    Active member of club or organization: Not on file    Attends meetings of clubs or organizations: Not on file    Relationship status: Not on file  Other Topics Concern  . 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   Lives in assisted living. Performs ADLs and IADLs, including cooking and managing his medications. Friend drives him to the store. Uses rollator walker at home and motorized scooter in store.   [facility] patient    Widowed   5-8 years education   Likes to fish   Pets:  4 cockatiels and 2 lovebirds   Review of Systems: A complete ROS was unable to be completed 2/2 lethargy.  Physical Exam: Blood pressure 111/67, pulse 83, temperature 99.8 F (37.7 C), temperature source Oral, resp. rate 19, weight 233 lb (105.7 kg), SpO2 96 %. GEN: Well-nourished. Alert. No acute distress. HENT: [place]/AT. Moist mucous membranes. No visible lesions. EYES: PERRL. Sclera non-icteric. Conjunctiva clear. RESP: Clear to auscultation  bilaterally. No wheezes, rales, or rhonchi. No increased work of breathing. CV: Normal rate and regular rhythm. No murmurs, gallops, or rubs. No LE edema. ABD: Obese. Soft. Non-tender. Non-distended. Normoactive bowel sounds. EXT: S/p L BKA, well-healed. S/p R trans-metatarsal amputation, surgical wound appears dehisced with foul-smelling purulent discharge. Surrounding erythema and edema noted up to ankle of right foot.  NEURO: Unable to complete 2/2 lethargy, but moving all 4 extremities against gravity. PSYCH: Patient is calm. Appropriate affect. speech is somewhat slowed  Labs CBC Latest Ref Rng & Units 03/26/2018 03/04/2018 03/03/2018  WBC 4.0 - 10.5 K/uL 19.8(H) 13.8(H) 16.6(H)  Hemoglobin 13.0 - 17.0 g/dL 6.6(LL) 8.3(L) 8.8(L)  Hematocrit 39.0 - 52.0 % 21.8(L) 26.6(L) 27.9(L)  Platelets 150 - 400 K/uL 443(H) 212 249   CMP Latest Ref Rng & Units 03/26/2018 03/04/2018 03/03/2018  Glucose 65 - 99 mg/dL 196(H) 160(H) 204(H)  BUN 6 - 20 mg/dL 38(H) 14 12  Creatinine 0.61 - 1.24 mg/dL 2.13(H) 1.06 1.20  Sodium 135 - 145 mmol/L 133(L) 135 133(L)  Potassium 3.5 - 5.1 mmol/L 4.1 3.7 4.7  Chloride 101 - 111 mmol/L 97(L) 99(L) 96(L)  CO2 22 - 32 mmol/L 23 27 27   Calcium 8.9 - 10.3 mg/dL 8.8(L) 8.6(L) 8.7(L)  Total Protein 6.5 - 8.1 g/dL 7.4 - 7.4  Total Bilirubin 0.3 - 1.2 mg/dL 0.5 - 0.7  Alkaline Phos 38 - 126 U/L 120 - 80  AST 15 - 41 U/L 38 - 23  ALT 17 - 63 U/L 41 - 16(L)   Lactic acid 2.58  -> 1.89 BCx pending  EKG: personally reviewed my interpretation is NSR, more prominent T wave inversions in lateral leads, no ST elevation  CXR: personally reviewed my interpretation is AP, poor inspiratory effort, no consolidation or effusions noted  Right foot x-ray  Assuming no surgery in the interval since most recent radiographs, there has been apparent breakdown of the stump area distally with portions of the remaining cuneiform bones exposed to open air. There is cortical irregularity along the lateral aspect of the cuboid bone, felt to represent osteomyelitis. There is extensive soft tissue air concerning for infection in the soft tissues of the remaining foot. No acute fracture or dislocation. Foci of arterial vascular calcification are noted, likely due to diabetes mellitus. There are posterior inferior calcaneal spurs.  Assessment & Plan by Problem: Active Problems:   Osteomyelitis [facility])  Mr. Hoying is a 72yo male who is a [facility] patient with PMH of PAD s/p L BKA, recent transmetatarsal amputation 02/22/2018, diabetes, and COPD/asthma who presents from [facility] for hypotension and lethargy, found to be septic with worsening right foot wound. Will need Ortho consult for likely BKA while inpatient.  Sepsis 2/2 right foot osteomyelitis and overlying cellulitis S/p R TMA 01/2018 Recent surgical site has not healed well since discharge 3 weeks ago. Patient has failed foot salvage intervention, and thus will require BKA. Afebrile on admission and otherwise HDS. Lactic acidosis resolved with IVF. Received IVF and empiric antibiotic coverage with vanc/zosyn. - Ortho consult in AM - F/u BCx - Change to IV vancomycin and cefepime due to AKI - Home norco 1-2 tablets q4h PRN  Acute on chronic normocytic anemia Hb 6.6 on admission, baseline ~9-10. Unclear source for blood loss - patient denies active bleeding. Could be related to systemic illness in the setting of osteomyelitis. -  Telemetry - 2u pRBC transfusion - F/u post-transfusion H&H - CBC in AM - Reticulocyte count, ferritin, iron/TIBC - FOBT  AKI Cr elevated at 2.13, compared to baseline ~0.9-1.0. Likely secondary to sepsis. S/p 4.5L IVF - BMP in AM  PAD S/p L BKA 03/2016 for non-healing ulcer/critical limb ischemia S/p R TMA 01/2018 On Evolocumab 140mg  SQ q14day, pentoxifylline 400mg  TID, ASA 81mg  Daily ABIs 04/2017 shows R ABI 0.85 with apparent occluded right SFA. - Holding home medications  DM Home regimen includes metformin 1000mg  BID, lantus 20 units daily and Novolog SSI - SSI-M TID WC - Pregabalin 100mg  TID  HTN Home regimen includes hydralazine 50mg  TID, lasix 20mg  daily, amlodipine 10mg  daily, losartan 100mg  daily, and metoprolol 200mg  BID WC - Hold home medications in setting of sepsis  Anxiety Home regimen includes carbamazepine 200mg  qAM/300mg  qPM, clonazepam 1mg  daily, duloxetine 60mg  daily, and mirtazapine 7.5mg  QHS - Hold home duloxetine and mirtazapine - Continue home carbamazepine and clonazepam at 0.5mg  QHS PRN  Chronic low back pain, 2/2 disc herniation - Continue home norco 1-2 tablets q4h PRN  Asthma/COPD Home regimen includes albuterol PRN. - Albuterol nebulizer q6h PRN  Gout - Continue home allopurinol 100mg  daily  Diet: NPO VTE PPx: SCDs Code Status: DNI from prior hospitalization (not confirmed on admission 2/2 lethargy), patient does want CPR and defibrillation Dispo: Admit patient to Inpatient with expected length of stay greater than 2 midnights.  Signed: Colbert Ewing, MD 03/26/2018, 7:42 PM  Pager: Mamie Nick [phone]

## 2018-03-26 NOTE — Progress Notes (Signed)
Pt's temp is 101.1 despite Tylenol that was given at 2025. IM called. Order was given to stop the blood transfusion.

## 2018-03-26 NOTE — ED Provider Notes (Addendum)
[facility] EMERGENCY DEPARTMENT Provider Note   CSN: [id number] Arrival date & time: 03/26/18  1255     History   Chief Complaint Chief Complaint  Patient presents with  . Wound Infection    HPI Leslie [name] is a 72 y.o. male.  HPI [facility] for [facility] and lethargy.  Patient is extremely poor historian.  He answers very few questions.  Patient had trans-metatarsal right foot amputation 2 weeks ago.  He is scheduled for a BKA in 2 weeks.  He was admitted and treated for infection with broad-spectrum antibiotics.  He was discharged on 5 days of oral Keflex. Past Medical History:  Diagnosis Date  . Anxiety    Panic attack - 10  years aago- ? panic   . Asthma   . Cellulitis of left foot 2016   Healed and recurred 01/2016  . Cellulitis of right foot 06/30/2012  . Chronic lower back pain 07/01/2012  . COPD (chronic obstructive pulmonary disease) (HCC)    Uses nebulizers at home. Former smoker, current chewing tobacco. 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  Marland Kitchen GERD (gastroesophageal reflux disease)   . Gouty arthritis 07/01/2012   Not attacks in ~5 yrs (03/2016)  . Hepatitis 1967   "in jail when I got it; dr said it was pretty bad; quaranteened X 30d"  . Hyperlipidemia   . Hypertension   . Lung nodule, solitary 07/2015   Due for 1-yr recheck in 07/2017  . Peripheral arterial disease ([place])    critical limb ischemia  . Peripheral neuropathy 07/01/2012  . Seasonal allergies   . Shortness of breath dyspnea   . Skin growth 07/01/2012   right side of nares; "been on there 20 years"  . Substance abuse ([place])   . Type II diabetes mellitus ([place]) 2002   Diagnosed in 2002    Patient Active Problem List   Diagnosis Date Noted  . Cellulitis of foot without toes, right 03/03/2018  . History of transmetatarsal amputation of right foot ([place]) 02/19/2018  . Amputated toe of right foot ([place]) 01/22/2018  . Gangrene of right foot ([place])   . 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 Below knee amputation status ([place]) 04/11/2016  . History of tobacco abuse 03/31/2016  . Osteomyelitis of left foot ([place])   . 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 Septic arthritis of left foot ([place]) 03/27/2016  . Solitary pulmonary nodule 08/14/2015  . Precordial chest pain 08/13/2015  . Peripheral arterial disease ([place]) 04/18/2015  . 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 Right anterior knee pain 11/19/2012  . Toenail deformity 01/16/2012  . Gout   . Diabetic neuropathy ([place]) 03/31/2008  . ALCOHOLISM 03/31/2008  . Rosacea 03/31/2008  . GERD 01/31/2008  . COPD (chronic obstructive pulmonary disease) ([place]) 08/03/2007  . DM W/COMPLICATION NOS, TYPE II 05/08/2007  . OBESITY 05/08/2007  . HTN (hypertension) 05/08/2007  . 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 HISTORY OF ASBESTOS EXPOSURE 05/08/2007  . Chronic lower back pain on narcotics  06/16/2005  . HLD (hyperlipidemia) 03/22/2002    Past Surgical History:  Procedure Laterality Date  . AMPUTATION Left 03/28/2016   Procedure: AMPUTATION left foot 4th and 5 th ray;  Surgeon: Newt Minion, MD;  Location: [facility];  Service: Orthopedics;  Laterality: Left;  . AMPUTATION Left 04/11/2016   Procedure: LEFT BELOW KNEE AMPUTATION;  Surgeon: Newt Minion, MD;  Location: [facility];  Service: Orthopedics;  Laterality: Left;  . AMPUTATION Right 01/22/2018   Procedure: RIGHT FOOT 3RD RAY AMPUTATION;  Surgeon: Newt Minion, MD;  Location: [facility];  Service: Orthopedics;  Laterality: Right;  . AMPUTATION Right 02/19/2018   Procedure: RIGHT TRANSMETATARSAL AMPUTATION;  Surgeon: Newt Minion, MD;  Location: [facility];  Service: Orthopedics;  Laterality: Right;  . LEG AMPUTATION BELOW KNEE Left 04/11/2016  . LOWER EXTREMITY ANGIOGRAM N/A 04/18/2013   Procedure: LOWER EXTREMITY ANGIOGRAM;  Surgeon: Sherren Mocha,  MD;  Location: [facility] C[facility];  Service: Cardiovascular;  Laterality: N/A;  . LOWER EXTREMITY ANGIOGRAM  08/13/2015   bilateral iliac   . PERIPHERAL VASCULAR CATHETERIZATION Bilateral 08/13/2015   Procedure: Lower Extremity Angiography;  Surgeon: Lorretta Harp, MD;  Location: [facility] CV LAB;  Service: Cardiovascular;  Laterality: Bilateral;  . PERIPHERAL VASCULAR CATHETERIZATION N/A 08/13/2015   Procedure: Abdominal Aortogram;  Surgeon: Lorretta Harp, MD;  Location: [facility] CV LAB;  Service: Cardiovascular;  Laterality: N/A;  . TOE AMPUTATION Right 01/22/2018   3RD TOE RIGHT FOOT        Home Medications    Prior to Admission medications   Medication Sig Start Date End Date Taking? Authorizing Provider  albuterol (PROVENTIL HFA;VENTOLIN HFA) 108 (90 Base) MCG/ACT inhaler Inhale 2 puffs into the lungs 4 (four) times daily as needed for wheezing or shortness of breath.     [provider]  albuterol (PROVENTIL) (2.5 MG/3ML) 0.083% nebulizer solution Take 2.5 mg by nebulization every 6 (six) hours as needed for wheezing or shortness of breath.    [provider]  allopurinol (ZYLOPRIM) 100 MG tablet Take 100 mg by mouth daily.    [provider]  amLODipine (NORVASC) 10 MG tablet Take 1 tablet (10 mg total) by mouth daily. 08/09/12   Funches, Adriana Mccallum, MD  antiseptic oral rinse (BIOTENE) LIQD 10 mLs by Mouth Rinse route daily.    [provider]  aspirin 81 MG tablet Take 81 mg by mouth daily.    [provider]  bisacodyl (DULCOLAX) 10 MG suppository Place 10 mg rectally once as needed (for constipation not relieved by Milk of Magnesia).    [provider]  carbamazepine (TEGRETOL) 200 MG tablet Take 200-300 mg by mouth See admin instructions. Take 200 mg by mouth in the morning and 300 mg in the evening    [provider]  Cholecalciferol (VITAMIN D-3) 1000 units CAPS Take 1,000 Units by mouth daily.    [provider]  clonazePAM (KLONOPIN) 1 MG tablet Take 1 mg by mouth at bedtime.     [provider]  DULoxetine (CYMBALTA) 60 MG capsule Take 60 mg by mouth daily.    [provider]  furosemide (LASIX) 20 MG tablet Take 20 mg by mouth daily.    [provider]  guaifenesin (GERI-TUSSIN) 100 MG/5ML syrup Take 100 mg by mouth every 4 (four) hours as needed for cough.    [provider]  guaifenesin (SILTUSSIN SA) 100 MG/5ML syrup Take 100 mg by mouth every 4 (four) hours as needed for cough.    [provider]  hydrALAZINE (APRESOLINE) 50 MG tablet Take 50 mg by mouth 3 (three) times daily.    [provider]  HYDROcodone-acetaminophen (NORCO) 10-325 MG tablet Take 1 tablet by mouth 3 (three) times daily.    [provider]  insulin aspart (NOVOLOG FLEXPEN) 100 UNIT/ML FlexPen  Inject 0-12 Units into the skin See admin instructions. Inject into the skin three times a day before meals, per sliding scale: BGL <200 = 0 units; 200-299 = 6 units; 300-399 = 9 units; > or equal to 400 = 12 units    [provider]  insulin glargine (LANTUS) 100 UNIT/ML injection Inject 20 Units into the skin daily.     [provider]  losartan (COZAAR) 100 MG tablet Take 100 mg by mouth daily.    [provider]  magnesium hydroxide (MILK OF MAGNESIA) 400 MG/5ML suspension Take 30 mLs by mouth once as needed for mild constipation.    [provider]  Menthol, Topical Analgesic, (BIOFREEZE) 4 % GEL Apply 1 application topically 2 (two) times daily.    [provider]  metFORMIN (GLUCOPHAGE) 1000 MG tablet Take 1 tablet (1,000 mg total) by mouth 2 (two) times daily with a meal. 08/16/15   Eileen Stanford, PA-C  metoprolol succinate (TOPROL-XL) 100 MG 24 hr tablet Take 200 mg by mouth 2 (two) times daily. Take with or immediately following a meal.    [provider]  mirtazapine (REMERON) 7.5 MG tablet Take  7.5 mg by mouth at bedtime.    [provider]  Multiple Vitamins-Minerals (CERTAVITE SENIOR/ANTIOXIDANT) TABS Take 1 tablet by mouth daily.    [provider]  nitroGLYCERIN (NITROSTAT) 0.4 MG SL tablet Place 0.4 mg under the tongue every 5 (five) minutes x 3 doses as needed for chest pain.  11/12/11   Funches, Adriana Mccallum, MD  nystatin (NYSTATIN) powder Apply topically to affected area three times a day for jock itch for 7 days    [provider]  pentoxifylline (TRENTAL) 400 MG CR tablet Take 1 tablet (400 mg total) by mouth 3 (three) times daily with meals. 02/04/18   Newt Minion, MD  pregabalin (LYRICA) 150 MG capsule Take 150 mg by mouth 3 (three) times daily.     [provider]  ranitidine (ZANTAC) 300 MG capsule Take 300 mg by mouth at bedtime.     [provider]  senna-docusate (SENEXON-S) 8.6-50 MG tablet Take 2 tablets by mouth 2 (two) times daily.    [provider]  Skin Protectants, Misc. (MINERIN) CREA Apply 1 application topically 2 (two) times daily.    [provider]  Sodium Phosphates (RA SALINE ENEMA) 19-7 GM/118ML ENEM Place 1 enema rectally once as needed (for constipation not relieved by Dulcolax suppository and call MD if no relief from enema).    [provider]    Family History Family History  Problem Relation Age of Onset  . Diabetes Sister   . Hypertension Mother     Social History Social History   Tobacco Use  . Smoking status: Former Smoker    Packs/day: 2.00    Years: 40.00    Pack years: 80.00    Types: Cigarettes    Last attempt to quit: 01/13/2002    Years since quitting: 16.2  . Smokeless tobacco: Current User    Types: Chew  . Tobacco comment: chews a little tobacco  Substance Use Topics  . Alcohol use: Not Currently    Comment: 03/03/18: "Used to drink alot. No alcohol in a long time."  . Drug use: No    Types: Marijuana    Comment: 03/27/2016 "last marijuana was maybe in  2013"     Allergies   Statins and Flexeril [cyclobenzaprine]   Review of Systems Review of Systems 10  Systems reviewed and are negative for acute change except as noted in the HPI.  Physical Exam Updated Vital Signs BP 124/61   Pulse 76   Temp 99.6 F (37.6 C) (Rectal)   Resp 17   Wt 105.7 kg (233 lb)   SpO2 98%   BMI 39.99 kg/m   Physical Exam  Constitutional:  Patient is somnolent.  He awakens to voice to answer simple questions.  No respiratory distress.  HENT:  Head: Normocephalic and atraumatic.  He was membranes dry.  Cardiovascular: Normal rate, regular rhythm, normal heart sounds and intact distal pulses.  Pulmonary/Chest: Effort normal and breath sounds normal.  Abdominal: Soft. He exhibits no distension. There is no tenderness.  Musculoskeletal:  Transmetatarsal amputation right forefoot.  Wound is now dehisced and has foul smell.  Diffuse blanching warm erythema to the ankle.  Neurological:  Patient is somnolent.  He answer simple questions but mostly stays asleep and does not follow commands.  Skin: Skin is warm and dry.     ED Treatments / Results  Labs (all labs ordered are listed, but only abnormal results are displayed) Labs Reviewed  COMPREHENSIVE METABOLIC PANEL - Abnormal; Notable for the following components:      Result Value   Sodium 133 (*)    Chloride 97 (*)    Glucose, Bld 196 (*)    BUN 38 (*)    Creatinine, Ser 2.13 (*)    Calcium 8.8 (*)    Albumin 2.1 (*)    GFR calc non Af Amer 29 (*)    GFR calc Af Amer 34 (*)    All other components within normal limits  CBC WITH DIFFERENTIAL/PLATELET - Abnormal; Notable for the following components:   WBC 19.8 (*)    RBC 2.68 (*)    Hemoglobin 6.6 (*)    HCT 21.8 (*)    MCH 24.6 (*)    RDW 16.8 (*)    Platelets 443 (*)    Neutro Abs 15.9 (*)    Monocytes Absolute 1.7 (*)    Abs Immature Granulocytes 0.2 (*)    All other components within normal limits  PROTIME-INR - Abnormal;  Notable for the following components:   Prothrombin Time 16.4 (*)    All other components within normal limits  I-STAT CG4 LACTIC ACID, ED - Abnormal; Notable for the following components:   Lactic Acid, Venous 2.58 (*)    All other components within normal limits  CULTURE, BLOOD (ROUTINE X 2)  CULTURE, BLOOD (ROUTINE X 2)  URINALYSIS, ROUTINE W REFLEX MICROSCOPIC  I-STAT CG4 LACTIC ACID, ED  TYPE AND SCREEN  PREPARE RBC (CROSSMATCH)  ABO/RH    EKG EKG Interpretation  Date/Time:  Friday Mar 26 2018 13:02:06 EDT Ventricular Rate:  75 PR Interval:    QRS Duration: 95 QT Interval:  394 QTC Calculation: 441 R Axis:   64 Text Interpretation:  Sinus rhythm Repol abnrm suggests ischemia, anterolateral agree. lateral changes increased compared to previous Confirmed by Charlesetta Shanks [phone]) on 03/26/2018 3:03:28 PM   Radiology No results found.  Procedures Procedures (including critical care time) CRITICAL CARE Performed by: Si Gaul   Total critical care time: 30 minutes  Critical care time was exclusive of separately billable procedures and treating other patients.  Critical care was necessary to treat or prevent imminent or life-threatening deterioration.  Critical care was time spent personally by me on the following activities: development of treatment plan with patient and/or surrogate as well as nursing, discussions  with consultants, evaluation of patient's response to treatment, examination of patient, obtaining history from patient or surrogate, ordering and performing treatments and interventions, ordering and review of laboratory studies, ordering and review of radiographic studies, pulse oximetry and re-evaluation of patient's condition. Medications Ordered in ED Medications  sodium chloride 0.9 % bolus 1,000 mL (1,000 mLs Intravenous New Bag/Given 03/26/18 1425)  sodium chloride 0.9 % bolus 1,000 mL (has no administration in time range)    And  sodium  chloride 0.9 % bolus 1,000 mL (has no administration in time range)    And  sodium chloride 0.9 % bolus 1,000 mL (has no administration in time range)    And  sodium chloride 0.9 % bolus 500 mL (has no administration in time range)  piperacillin-tazobactam (ZOSYN) IVPB 3.375 g (has no administration in time range)  0.9 %  sodium chloride infusion (has no administration in time range)  vancomycin (VANCOCIN) 2,000 mg in sodium chloride 0.9 % 500 mL IVPB (has no administration in time range)  piperacillin-tazobactam (ZOSYN) IVPB 3.375 g (has no administration in time range)  vancomycin (VANCOCIN) 1,250 mg in sodium chloride 0.9 % 250 mL IVPB (has no administration in time range)     Initial Impression / Assessment and Plan / ED Course  I have reviewed the triage vital signs and the nursing notes.  Pertinent labs & imaging results that were available during my care of the patient were reviewed by me and considered in my medical decision making (see chart for details).     Consult: (15: 38) internal medicine teaching service for admission. Final Clinical Impressions(s) / ED Diagnoses   Final diagnoses:  Sepsis, due to unspecified organism ([place])  Symptomatic anemia  Wound infection  AKI (acute kidney injury) ([place])   Patient was recently discharged for infection of the foot.  This appears to have progressed.  There is now dehiscence with foul smell from the wound and cellulitis to the ankle.  Patient again has lethargic mental status.  He was hypotensive reportedly at his assisted living.  At this time he is started on a sepsis protocol.  He is also found to have AKI and significant anemia.  Plan for admission to internal medicine teaching service. ED Discharge Orders    None       Charlesetta Shanks, MD 03/26/18 [place], MD 03/26/18 [phone]

## 2018-03-27 ENCOUNTER — Inpatient Hospital Stay (HOSPITAL_COMMUNITY): Payer: Medicare (Managed Care)

## 2018-03-27 DIAGNOSIS — Z89612 Acquired absence of left leg above knee: Secondary | ICD-10-CM

## 2018-03-27 DIAGNOSIS — I959 Hypotension, unspecified: Secondary | ICD-10-CM

## 2018-03-27 DIAGNOSIS — A419 Sepsis, unspecified organism: Secondary | ICD-10-CM

## 2018-03-27 DIAGNOSIS — L089 Local infection of the skin and subcutaneous tissue, unspecified: Secondary | ICD-10-CM

## 2018-03-27 DIAGNOSIS — Z9889 Other specified postprocedural states: Secondary | ICD-10-CM

## 2018-03-27 DIAGNOSIS — N179 Acute kidney failure, unspecified: Secondary | ICD-10-CM

## 2018-03-27 DIAGNOSIS — F419 Anxiety disorder, unspecified: Secondary | ICD-10-CM

## 2018-03-27 DIAGNOSIS — Z89421 Acquired absence of other right toe(s): Secondary | ICD-10-CM

## 2018-03-27 DIAGNOSIS — E43 Unspecified severe protein-calorie malnutrition: Secondary | ICD-10-CM

## 2018-03-27 DIAGNOSIS — Z79899 Other long term (current) drug therapy: Secondary | ICD-10-CM

## 2018-03-27 DIAGNOSIS — M109 Gout, unspecified: Secondary | ICD-10-CM

## 2018-03-27 DIAGNOSIS — T148XXA Other injury of unspecified body region, initial encounter: Secondary | ICD-10-CM

## 2018-03-27 DIAGNOSIS — L03115 Cellulitis of right lower limb: Secondary | ICD-10-CM

## 2018-03-27 DIAGNOSIS — Z79891 Long term (current) use of opiate analgesic: Secondary | ICD-10-CM

## 2018-03-27 DIAGNOSIS — Z89411 Acquired absence of right great toe: Secondary | ICD-10-CM

## 2018-03-27 DIAGNOSIS — R5383 Other fatigue: Secondary | ICD-10-CM

## 2018-03-27 DIAGNOSIS — Z7982 Long term (current) use of aspirin: Secondary | ICD-10-CM

## 2018-03-27 DIAGNOSIS — G8929 Other chronic pain: Secondary | ICD-10-CM

## 2018-03-27 DIAGNOSIS — J9601 Acute respiratory failure with hypoxia: Secondary | ICD-10-CM

## 2018-03-27 DIAGNOSIS — R0603 Acute respiratory distress: Secondary | ICD-10-CM

## 2018-03-27 DIAGNOSIS — E1151 Type 2 diabetes mellitus with diabetic peripheral angiopathy without gangrene: Secondary | ICD-10-CM

## 2018-03-27 DIAGNOSIS — J449 Chronic obstructive pulmonary disease, unspecified: Secondary | ICD-10-CM

## 2018-03-27 DIAGNOSIS — E1169 Type 2 diabetes mellitus with other specified complication: Secondary | ICD-10-CM

## 2018-03-27 DIAGNOSIS — L711 Rhinophyma: Secondary | ICD-10-CM

## 2018-03-27 DIAGNOSIS — T8781 Dehiscence of amputation stump: Secondary | ICD-10-CM

## 2018-03-27 DIAGNOSIS — M545 Low back pain: Secondary | ICD-10-CM

## 2018-03-27 DIAGNOSIS — M869 Osteomyelitis, unspecified: Secondary | ICD-10-CM

## 2018-03-27 DIAGNOSIS — J9602 Acute respiratory failure with hypercapnia: Secondary | ICD-10-CM

## 2018-03-27 DIAGNOSIS — T8130XA Disruption of wound, unspecified, initial encounter: Secondary | ICD-10-CM

## 2018-03-27 DIAGNOSIS — Z794 Long term (current) use of insulin: Secondary | ICD-10-CM

## 2018-03-27 DIAGNOSIS — D649 Anemia, unspecified: Secondary | ICD-10-CM

## 2018-03-27 LAB — BPAM RBC
BLOOD PRODUCT EXPIRATION DATE: 201906172359
Blood Product Expiration Date: 201906202359
ISSUE DATE / TIME: 201905311625
ISSUE DATE / TIME: 201905311954
UNIT TYPE AND RH: 9500
Unit Type and Rh: 9500

## 2018-03-27 LAB — IRON AND TIBC
Iron: 23 ug/dL — ABNORMAL LOW (ref 45–182)
Saturation Ratios: 15 % — ABNORMAL LOW (ref 17.9–39.5)
TIBC: 153 ug/dL — ABNORMAL LOW (ref 250–450)
UIBC: 130 ug/dL

## 2018-03-27 LAB — COMPREHENSIVE METABOLIC PANEL
ALT: 35 U/L (ref 17–63)
AST: 39 U/L (ref 15–41)
Albumin: 1.9 g/dL — ABNORMAL LOW (ref 3.5–5.0)
Alkaline Phosphatase: 108 U/L (ref 38–126)
Anion gap: 12 (ref 5–15)
BUN: 31 mg/dL — AB (ref 6–20)
CALCIUM: 7.6 mg/dL — AB (ref 8.9–10.3)
CO2: 18 mmol/L — ABNORMAL LOW (ref 22–32)
CREATININE: 1.58 mg/dL — AB (ref 0.61–1.24)
Chloride: 108 mmol/L (ref 101–111)
GFR calc Af Amer: 49 mL/min — ABNORMAL LOW (ref >60)
GFR, EST NON AFRICAN AMERICAN: 42 mL/min — AB (ref >60)
GLUCOSE: 171 mg/dL — AB (ref 65–99)
POTASSIUM: 3.7 mmol/L (ref 3.5–5.1)
Sodium: 138 mmol/L (ref 135–145)
Total Bilirubin: 1.2 mg/dL (ref 0.3–1.2)
Total Protein: 6.6 g/dL (ref 6.5–8.1)

## 2018-03-27 LAB — BLOOD GAS, ARTERIAL
Acid-base deficit: 10.6 mmol/L — ABNORMAL HIGH (ref 0.0–2.0)
Bicarbonate: 16.7 mmol/L — ABNORMAL LOW (ref 20.0–28.0)
Delivery systems: POSITIVE
Drawn by: 50220
EXPIRATORY PAP: 8
FIO2: 100
INSPIRATORY PAP: 20
O2 SAT: 96.5 %
PATIENT TEMPERATURE: 98.6
PO2 ART: 114 mmHg — AB (ref 83.0–108.0)
RATE: 8 resp/min
pCO2 arterial: 50.5 mmHg — ABNORMAL HIGH (ref 32.0–48.0)
pH, Arterial: 7.147 — CL (ref 7.350–7.450)

## 2018-03-27 LAB — TYPE AND SCREEN
ABO/RH(D): O NEG
ANTIBODY SCREEN: NEGATIVE
Unit division: 0
Unit division: 0

## 2018-03-27 LAB — CBC
HCT: 27.3 % — ABNORMAL LOW (ref 39.0–52.0)
Hemoglobin: 8.2 g/dL — ABNORMAL LOW (ref 13.0–17.0)
MCH: 24.8 pg — ABNORMAL LOW (ref 26.0–34.0)
MCHC: 30 g/dL (ref 30.0–36.0)
MCV: 82.7 fL (ref 78.0–100.0)
PLATELETS: 414 10*3/uL — AB (ref 150–400)
RBC: 3.3 MIL/uL — ABNORMAL LOW (ref 4.22–5.81)
RDW: 17.1 % — AB (ref 11.5–15.5)
WBC: 20.2 10*3/uL — AB (ref 4.0–10.5)

## 2018-03-27 LAB — PROTIME-INR
INR: 1.44
PROTHROMBIN TIME: 17.4 s — AB (ref 11.4–15.2)

## 2018-03-27 LAB — FERRITIN: Ferritin: 424 ng/mL — ABNORMAL HIGH (ref 24–336)

## 2018-03-27 LAB — RETICULOCYTES
RBC.: 3.25 MIL/uL — AB (ref 4.22–5.81)
RETIC COUNT ABSOLUTE: 58.5 10*3/uL (ref 19.0–186.0)
Retic Ct Pct: 1.8 % (ref 0.4–3.1)

## 2018-03-27 LAB — SURGICAL PCR SCREEN
MRSA, PCR: NEGATIVE
Staphylococcus aureus: NEGATIVE

## 2018-03-27 LAB — GLUCOSE, CAPILLARY: Glucose-Capillary: 161 mg/dL — ABNORMAL HIGH (ref 65–99)

## 2018-03-27 LAB — OCCULT BLOOD X 1 CARD TO LAB, STOOL: FECAL OCCULT BLD: NEGATIVE

## 2018-03-27 LAB — APTT: aPTT: 49 seconds — ABNORMAL HIGH (ref 24–36)

## 2018-03-27 MED ORDER — POVIDONE-IODINE 10 % EX SWAB: 2.0000 "application " | Freq: Once | CUTANEOUS | Status: DC

## 2018-03-27 MED ORDER — CEFAZOLIN SODIUM-DEXTROSE 2-4 GM/100ML-% IV SOLN
2.0000 g | INTRAVENOUS | Status: AC
  Administered 2018-03-28: 2 g via INTRAVENOUS
  Filled 2018-03-27: qty 100

## 2018-03-27 MED ORDER — CHLORHEXIDINE GLUCONATE 4 % EX LIQD
60.0000 mL | Freq: Once | CUTANEOUS | Status: AC
  Administered 2018-03-27: 4 via TOPICAL
  Filled 2018-03-27: qty 15

## 2018-03-27 MED ORDER — ONDANSETRON HCL 4 MG/2ML IJ SOLN
4.0000 mg | Freq: Four times a day (QID) | INTRAMUSCULAR | Status: DC | PRN
  Administered 2018-03-27: 4 mg via INTRAVENOUS
  Filled 2018-03-27: qty 2

## 2018-03-27 MED ORDER — VANCOMYCIN HCL IN DEXTROSE 750-5 MG/150ML-% IV SOLN
750.0000 mg | Freq: Two times a day (BID) | INTRAVENOUS | Status: DC
  Administered 2018-03-27 – 2018-03-30 (×6): 750 mg via INTRAVENOUS
  Filled 2018-03-27 (×7): qty 150

## 2018-03-27 MED ORDER — FUROSEMIDE 10 MG/ML IJ SOLN
80.0000 mg | Freq: Once | INTRAMUSCULAR | Status: AC
  Administered 2018-03-27: 80 mg via INTRAVENOUS
  Filled 2018-03-27: qty 8

## 2018-03-27 MED ORDER — ALUM & MAG HYDROXIDE-SIMETH 200-200-20 MG/5ML PO SUSP
30.0000 mL | ORAL | Status: DC | PRN
  Administered 2018-03-27: 30 mL via ORAL
  Filled 2018-03-27: qty 30

## 2018-03-27 MED ORDER — PENTOXIFYLLINE ER 400 MG PO TBCR
400.0000 mg | EXTENDED_RELEASE_TABLET | Freq: Three times a day (TID) | ORAL | Status: DC
  Administered 2018-03-27 – 2018-03-30 (×11): 400 mg via ORAL
  Filled 2018-03-27 (×13): qty 1

## 2018-03-27 NOTE — Progress Notes (Signed)
Subjective:  Leslie [name] was seen resting in his bed this morning. He states he is feeling much better and he did not remember being in the ED at all yesterday, just leaving [facility], then waking up later on the floor. He is saturating well on room air and denies shortness of breath, chest pain, or pain at his R transmet stump. Patient informed the he would be evaluated by Dr. Sharol Given and his surgery will likely be moved up. He states he is ready to go ahead and get the amputation done and start working towards recovery.  Patient experienced respiratory distress overnight requiring BiPAP. Patient was given 80mg  IV Lasix in the setting of 5L IVF for hypotension 2/2 to sepsis. Patients symptoms improved dramatically and he is now saturating well on room air without dyspnea.  Objective:  Vital signs in last 24 hours: Vitals:   03/27/18 0425 03/27/18 0429 03/27/18 0814 03/27/18 1140  BP: (!) 122/56  134/61 139/69  Pulse: 96  92 92  Resp: 16   13  Temp: (!) 100.7 F (38.2 C)  98.6 F (37 C) 98.4 F (36.9 C)  TempSrc: Axillary  Oral Oral  SpO2: 100% 100% 98% 97%  Weight: 213 lb 3.2 oz (96.7 kg)      Physical Exam  Constitutional: He appears well-developed and well-nourished. No distress.  HENT:  Head: Normocephalic and atraumatic.  Cardiovascular: Normal rate, regular rhythm, normal heart sounds and intact distal pulses.  Pulmonary/Chest: Effort normal. He has no wheezes.  Abdominal: Soft. Bowel sounds are normal. There is no tenderness.  Obese abdomen  Musculoskeletal: He exhibits no edema.  R Transmet amputaiton wound with clean bandage in place S/P L BKA No Edema noted   Neurological: He is alert.  Skin: Skin is warm and dry.   Assessment/Plan: 72yo male who is a [facility] with PMH of PAD s/p L BKA, recent transmetatarsal amputation 02/22/2018, diabetes, and COPD/asthma who [facility] for hypotension and lethargy  Sepsis 2/2 right foot osteomyelitis and overlying  cellulitis S/p R TMA 01/2018 Recent surgical site has not healed well since discharge 3 weeks ago. Patient failed foot salvage intervention, and will require BKA. Afebrile on admission and otherwise HDS. Lactic acidosis resolved with IVF. Received IVF and empiric antibiotic coverage with vanc/zosyn. > Blood Cx: In Process - Ortho following, appreciate their recommendations - BKA tomorrow 6/2 - IV vancomycin and cefepime - Home norco 1-2 tablets q4h PRN  Acute on chronic normocytic anemia Hb 6.6 on admission, baseline ~9-10. Unclear source for blood loss - patient denies active bleeding. Could be related to systemic illness in the setting of osteomyelitis. > Patient experienced fever during transfusion with no other symptoms noted, likely 2/2 to ongoing infection, transfusion resumed without complication > Hgb improved to 8.6 s/p 2U pRBC > Reticulocyte count WNL; Fe 23; TIBC 153; Ferritin 424 >> Consistent with Anemia of chronic disease > FOBT negative - Cardiac Monitoring  - LDH - CBC in AM  AKI:  Cr elevated at 2.13 on admission (baseline ~0.9-1.0) likely 2/2 sepsis. S/p 4.5L IVF > Cr improved to 1.58 on 6/1 - Holding further IVF in the setting of acute respiratory failure overnight resolved with IV lasix - BMP in AM  Acute hypoxic and hypercarbic respiratory failure: Patient experienced respiratory failure overnight requiring BiPAP. Patient was given 80mg  IV Lasix in the setting of receiving 4.5L IVF for hypotension 2/2 to sepsis and receiving transfusions. Patients symptoms improved dramatically and he is now saturating well  on room air without dyspnea. - Resolved - Continue to monitor - PRN BiPAP  - Will give Lasix PRN if further respiratory failure developes  PAD S/p L BKA 03/2016 for non-healing ulcer/critical limb ischemia S/p R TMA 01/2018 > On Evolocumab 140mg  SQ q14day, Pentoxifylline 400mg  TID, ASA 81mg  Daily > ABIs 04/2017 shows RABI 0.85 withapparentoccluded right  SFA. - Holding home medications  DM: Home regimen: Metformin 1000mg  BID, Lantus 20 units daily and Novolog SSI > CBG 171 - Keep blood sugar <200 to promote healing - SSI-M TID WC - Pregabalin 100mg  TID  HTN: Home regimen: Hydralazine 50mg  TID, Lasix 20mg  daily, Amlodipine 10mg  daily, Losartan 100mg  daily, and Metoprolol 200mg  BID WC > All medications held initially 2/2 Sepsis, BP Normotensive 6/1 - Continue to hold home antihypertensives  Anxiety: Home regimen: Carbamazepine 200mg  qAM/300mg  qPM, Clonazepam 1mg  daily, Duloxetine 60mg  daily, and Mirtazapine 7.5mg  QHS - Holding home duloxetine and mirtazapine - Continue home carbamazepine and clonazepam at 0.5mg  QHS PRN  Chronic low back pain, 2/2 disc herniation - Continue home norco 1-2 tablets q4h PRN  Asthma/COPD: Mild wheezing on exam today Home regimen includes albuterol PRN. - Albuterol nebulizer q6h PRN  Gout - Continue home allopurinol 100mg  daily  Diet: CM, NPO MN VTE PPx: SCDs Code Status: Full  Dispo: Anticipated discharge pending surgery and post op course.   Neva Seat, MD 03/27/2018, 12:21 PM Pager: [phone]

## 2018-03-27 NOTE — Progress Notes (Signed)
Pt transferred to 4E15 on Bipap.  Pt was placed on NRB while IMTS was in the room.  Pt was able to maintain sats of 96% while on NRB.  Dr. Jari Favre stated to keep the patient on NRB as tolerated.  If the patient has further distress, place the patient back on Bipap.

## 2018-03-27 NOTE — H&P (View-Only) (Signed)
ORTHOPAEDIC CONSULTATION  REQUESTING PHYSICIAN: Sid Falcon, MD  Chief Complaint: Dehiscence right transmetatarsal amputation.  HPI: Leslie [name] is a 72 y.o. male who presents with dehiscence of the right transmetatarsal amputation.  Patient has diabetic insensate neuropathy peripheral vascular disease with severe protein caloric malnutrition who has been walking on his foot and has had progressive dehiscence of the wound.  Past Medical History:  Diagnosis Date  . Anxiety    Panic attack - 10  years aago- ? panic   . Asthma   . Cellulitis of left foot 2016   Healed and recurred 01/2016  . Cellulitis of right foot 06/30/2012  . Chronic lower back pain 07/01/2012  . COPD (chronic obstructive pulmonary disease) (HCC)    Uses nebulizers at home. Former smoker, current chewing tobacco. 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  Marland Kitchen GERD (gastroesophageal reflux disease)   . Gouty arthritis 07/01/2012   Not attacks in ~5 yrs (03/2016)  . Hepatitis 1967   "in jail when I got it; dr said it was pretty bad; quaranteened X 30d"  . Hyperlipidemia   . Hypertension   . Lung nodule, solitary 07/2015   Due for 1-yr recheck in 07/2017  . Peripheral arterial disease ([place])    critical limb ischemia  . Peripheral neuropathy 07/01/2012  . Seasonal allergies   . Shortness of breath dyspnea   . Skin growth 07/01/2012   right side of nares; "been on there 20 years"  . Substance abuse ([place])   . Type II diabetes mellitus ([place]) 2002   Diagnosed in 2002   Past Surgical History:  Procedure Laterality Date  . AMPUTATION Left 03/28/2016   Procedure: AMPUTATION left foot 4th and 5 th ray;  Surgeon: Newt Minion, MD;  Location: [facility];  Service: Orthopedics;  Laterality: Left;  . AMPUTATION Left 04/11/2016   Procedure: LEFT BELOW KNEE AMPUTATION;  Surgeon: Newt Minion, MD;  Location: [facility];  Service: Orthopedics;  Laterality: Left;  . AMPUTATION Right 01/22/2018   Procedure: RIGHT FOOT 3RD RAY AMPUTATION;  Surgeon: Newt Minion, MD;  Location: [facility];  Service: Orthopedics;  Laterality: Right;  . AMPUTATION Right 02/19/2018   Procedure: RIGHT TRANSMETATARSAL AMPUTATION;  Surgeon: Newt Minion, MD;  Location: [facility];  Service: Orthopedics;  Laterality: Right;  . LEG AMPUTATION BELOW KNEE Left 04/11/2016  . LOWER EXTREMITY ANGIOGRAM N/A 04/18/2013   Procedure: LOWER EXTREMITY ANGIOGRAM;  Surgeon: Sherren Mocha, MD;  Location: [facility] CATH LAB;  Service: Cardiovascular;  Laterality: N/A;  . LOWER EXTREMITY ANGIOGRAM  08/13/2015   bilateral iliac   . PERIPHERAL VASCULAR CATHETERIZATION Bilateral 08/13/2015   Procedure: Lower Extremity Angiography;  Surgeon: Lorretta Harp, MD;  Location: [facility] CV LAB;  Service: Cardiovascular;  Laterality: Bilateral;  . PERIPHERAL VASCULAR CATHETERIZATION N/A 08/13/2015   Procedure: Abdominal Aortogram;  Surgeon: Lorretta Harp, MD;  Location: [facility] CV LAB;  Service: Cardiovascular;  Laterality: N/A;  . TOE AMPUTATION Right 01/22/2018   3RD TOE RIGHT FOOT   Social History   Socioeconomic History  . Marital status: Widowed    Spouse name: Not on file  . Number of children: Not on file  . Years of education: Not on file  . Highest education level: Not on file  Occupational History  . Occupation: disabled  Social Needs  . Financial resource strain: Not on file  . Food insecurity:    Worry: Not on file    Inability: Not on file  .  Transportation needs:    Medical: Not on file    Non-medical: Not on file  Tobacco Use  . Smoking status: Former Smoker    Packs/day: 2.00    Years: 40.00    Pack years: 80.00    Types: Cigarettes    Last attempt to quit: 01/13/2002    Years since quitting: 16.2  . Smokeless tobacco: Current User    Types: Chew  . Tobacco comment: chews a little tobacco  Substance and Sexual Activity  . Alcohol use: Not Currently    Comment: 03/03/18: "Used to drink alot. No alcohol in a long time."  . Drug use: No    Types: Marijuana     Comment: 03/27/2016 "last marijuana was maybe in 2013"  . Sexual activity: Never  Lifestyle  . Physical activity:    Days per week: Not on file    Minutes per session: Not on file  . Stress: Not on file  Relationships  . Social connections:    Talks on phone: Not on file    Gets together: Not on file    Attends religious service: Not on file    Active member of club or organization: Not on file    Attends meetings of clubs or organizations: Not on file    Relationship status: Not on file  Other Topics Concern  . 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   Lives in assisted living. Performs ADLs and IADLs, including cooking and managing his medications. Friend drives him to the store. Uses rollator walker at home and motorized scooter in store.   [facility] patient   Widowed   5-8 years education   Likes to fish   Pets:  4 cockatiels and 2 lovebirds   Family History  Problem Relation Age of Onset  . Diabetes Sister   . Hypertension Mother    - negative except otherwise stated in the family history section Allergies  Allergen Reactions  . Atorvastatin Other (See Comments)    "Allergic," per paperwork from facility  . Statins Other (See Comments)    "Allergic," per MAR  . Flexeril [Cyclobenzaprine] Other (See Comments)    Dry mouth and hallucinations   Prior to Admission medications   Medication Sig Start Date End Date Taking? Authorizing Provider  albuterol (PROVENTIL HFA;VENTOLIN HFA) 108 (90 Base) MCG/ACT inhaler Inhale 2 puffs into the lungs 4 (four) times daily as needed for wheezing or shortness of breath.    Yes [provider]  albuterol (PROVENTIL) (2.5 MG/3ML) 0.083% nebulizer solution Take 2.5 mg by nebulization every 6 (six) hours as needed for wheezing or shortness of breath.   Yes [provider]  guaifenesin (GERI-TUSSIN) 100 MG/5ML syrup Take 100 mg by mouth every 4  (four) hours as needed for cough.   Yes [provider]  guaifenesin (SILTUSSIN SA) 100 MG/5ML syrup Take 100 mg by mouth every 4 (four) hours as needed for cough.   Yes [provider]  HYDROcodone-acetaminophen (NORCO) 10-325 MG tablet Take 1 tablet by mouth 3 (three) times daily.   Yes [provider]  Menthol, Topical Analgesic, (BIOFREEZE) 4 % GEL Apply 1 application topically 2 (two) times daily.   Yes [provider]  metFORMIN (GLUCOPHAGE) 1000 MG tablet Take 1 tablet (1,000 mg total) by mouth 2 (two) times daily with a meal. 08/16/15  Yes Eileen Stanford, PA-C  nitroGLYCERIN (NITROSTAT) 0.4 MG SL tablet  Place 0.4 mg under the tongue every 5 (five) minutes x 3 doses as needed for chest pain.  11/12/11  Yes Funches, Josalyn, MD  pregabalin (LYRICA) 150 MG capsule Take 150 mg by mouth 3 (three) times daily.    Yes [provider]  senna-docusate (SENEXON-S) 8.6-50 MG tablet Take 2 tablets by mouth 2 (two) times daily.   Yes [provider]  Skin Protectants, Misc. (MINERIN) CREA Apply 1 application topically 2 (two) times daily.   Yes [provider]  Sodium Phosphates (RA SALINE ENEMA) 19-7 GM/118ML ENEM Place 1 enema rectally once as needed (for constipation not relieved by Dulcolax suppository and call MD if no relief from enema).   Yes [provider]  allopurinol (ZYLOPRIM) 100 MG tablet Take 100 mg by mouth daily.    [provider]  amLODipine (NORVASC) 10 MG tablet Take 1 tablet (10 mg total) by mouth daily. 08/09/12   Funches, Adriana Mccallum, MD  antiseptic oral rinse (BIOTENE) LIQD 10 mLs by Mouth Rinse route daily.    [provider]  aspirin 81 MG tablet Take 81 mg by mouth daily.    [provider]  bisacodyl (DULCOLAX) 10 MG suppository Place 10 mg rectally once as needed (for constipation not relieved by Milk of Magnesia).    [provider]  carbamazepine (TEGRETOL) 200 MG  tablet Take 200-300 mg by mouth See admin instructions. Take 200 mg by mouth in the morning and 300 mg in the evening    [provider]  Cholecalciferol (VITAMIN D-3) 1000 units CAPS Take 1,000 Units by mouth daily.    [provider]  clonazePAM (KLONOPIN) 1 MG tablet Take 1 mg by mouth at bedtime.     [provider]  DULoxetine (CYMBALTA) 60 MG capsule Take 60 mg by mouth daily.    [provider]  furosemide (LASIX) 20 MG tablet Take 20 mg by mouth daily.    [provider]  hydrALAZINE (APRESOLINE) 50 MG tablet Take 50 mg by mouth 3 (three) times daily.    [provider]  insulin aspart (NOVOLOG FLEXPEN) 100 UNIT/ML FlexPen Inject 0-12 Units into the skin See admin instructions. Inject into the skin three times a day before meals, per sliding scale: BGL <200 = 0 units; 200-299 = 6 units; 300-399 = 9 units; > or equal to 400 = 12 units    [provider]  insulin glargine (LANTUS) 100 UNIT/ML injection Inject 20 Units into the skin daily.     [provider]  losartan (COZAAR) 100 MG tablet Take 100 mg by mouth daily.    [provider]  magnesium hydroxide (MILK OF MAGNESIA) 400 MG/5ML suspension Take 30 mLs by mouth once as needed for mild constipation.    [provider]  metoprolol succinate (TOPROL-XL) 100 MG 24 hr tablet Take 200 mg by mouth 2 (two) times daily. Take with or immediately following a meal.    [provider]  mirtazapine (REMERON) 7.5 MG tablet Take 7.5 mg by mouth at bedtime.    [provider]  Multiple Vitamins-Minerals (CERTAVITE SENIOR/ANTIOXIDANT) TABS Take 1 tablet by mouth daily.    [provider]  nystatin (NYSTATIN) powder Apply topically to affected area three times a day for jock itch for 7 days    [provider]  pentoxifylline (TRENTAL) 400 MG CR tablet Take 1 tablet (400 mg total) by mouth 3 (three) times daily with meals. 02/04/18    Meridee Score  V, MD  ranitidine (ZANTAC) 300 MG capsule Take 300 mg by mouth at bedtime.     [provider]   Dg Chest 2 View  Result Date: 03/26/2018 CLINICAL DATA:  Altered mental status. EXAM: CHEST - 2 VIEW COMPARISON:  03/03/2018 FINDINGS: The patient is rotated to the right. The cardiac silhouette remains enlarged. Aortic atherosclerosis is noted. The lungs are mildly hypoinflated, and there is motion artifact on the lateral radiograph. No confluent airspace opacity, overt pulmonary edema, sizable pleural effusion, or pneumothorax is identified. No acute osseous abnormality is seen. IMPRESSION: No active cardiopulmonary disease. Electronically Signed   By: Logan Bores M.D.   On: 03/26/2018 15:12   Dg Chest Port 1 View  Result Date: 03/27/2018 CLINICAL DATA:  72 y/o  M; acute respiratory distress. EXAM: PORTABLE CHEST 1 VIEW COMPARISON:  03/26/2018 chest radiograph FINDINGS: Stable enlarged cardiac silhouette given projection and technique. Aortic atherosclerosis with calcification. Reticular and hazy opacities of the lungs with basilar predominance. Suspected small effusions. Mild reverse S curvature of the spine. IMPRESSION: Reticular and hazy opacities of the lungs with basilar predominance, probably representing pulmonary edema, possibly pneumonia. Suspected small effusions. Electronically Signed   By: Kristine Garbe M.D.   On: 03/27/2018 03:00   Dg Foot Complete Right  Result Date: 03/26/2018 CLINICAL DATA:  Open wound.  Recent amputation EXAM: RIGHT FOOT COMPLETE - 3+ VIEW COMPARISON:  Mar 03, 2018 FINDINGS: Frontal, oblique, and lateral views were obtained. There is apparent wound at the amputation site distally with a portion of the remaining proximal cuneiform bones exposed to open air. There is extensive soft tissue air present. There is cortical irregularity along the cuboid suggesting osteomyelitis in this area. Other bony structures appear intact. No acute fracture or  dislocation. There is extensive arterial vascular calcification. There are spurs arising from the posterior and inferior calcaneus. IMPRESSION: Assuming no surgery in the interval since most recent radiographs, there has been apparent breakdown of the stump area distally with portions of the remaining cuneiform bones exposed to open air. There is cortical irregularity along the lateral aspect of the cuboid bone, felt to represent osteomyelitis. There is extensive soft tissue air concerning for infection in the soft tissues of the remaining foot. No acute fracture or dislocation. Foci of arterial vascular calcification are noted, likely due to diabetes mellitus. There are posterior inferior calcaneal spurs. Electronically Signed   By: Lowella Grip III M.D.   On: 03/26/2018 15:13   - pertinent xrays, CT, MRI studies were reviewed and independently interpreted  Positive ROS: All other systems have been reviewed and were otherwise negative with the exception of those mentioned in the HPI and as above.  Physical Exam: General: Alert, no acute distress Psychiatric: Patient is competent for consent with normal mood and affect Lymphatic: No axillary or cervical lymphadenopathy Cardiovascular: No pedal edema Respiratory: No cyanosis, no use of accessory musculature GI: No organomegaly, abdomen is soft and non-tender    Images:  @ENCIMAGES @  Labs:  Lab Results  Component Value Date   HGBA1C 7.1 (H) 02/19/2018   HGBA1C 7.2 (H) 03/27/2016   HGBA1C 6.9 11/19/2012   ESRSEDRATE 90 (H) 03/27/2016   CRP 23.4 (H) 03/27/2016   LABURIC 4.8 08/09/2012   REPTSTATUS 03/08/2018 FINAL 03/03/2018   CULT  03/03/2018    NO GROWTH 5 DAYS Performed at [facility], [place] [address] [place], [place] [address]     Lab Results  Component Value Date   ALBUMIN 1.9 (  L) 03/27/2018   ALBUMIN 2.1 (L) 03/26/2018   ALBUMIN 2.9 (L) 03/03/2018   PREALBUMIN 13.8 (L) 03/27/2016   LABURIC 4.8 08/09/2012     Neurologic: Patient does not have protective sensation bilateral lower extremities.   MUSCULOSKELETAL:   Skin: Examination patient has complete dehiscence of the transmetatarsal amputation there is exposed bone.  The wound is necrotic this is gaped open 4 x 6 cm and is 3 cm deep.  Patient has no ascending cellulitis he does have swelling in the right lower extremity there is no purulent drainage no abscess.  He has a stable well-healed left transtibial amputation.  His albumin is 1.9 hemoglobin A1c 7.1.  Assessment: Assessment: Diabetic insensate neuropathy severe protein caloric malnutrition with peripheral vascular disease with dehiscence of the transmetatarsal amputation.  Plan: Plan: We will plan for a transtibial amputation tomorrow risk and benefits were discussed including risk of the wound not healing.  Patient states he understands wished to proceed.  Patient will need discharge to skilled nursing.  Thank you for the consult and the opportunity to see [name], [place] [phone] 11:43 AM

## 2018-03-27 NOTE — Progress Notes (Addendum)
IMTS Cross Cover Progress Note  Alerted by nursing that patient started desaturating at around 2am and was found to have increased work of breathing and diaphoretic with sats lowest to mid 52's which did not improve with nonrebreather. Respiratory placed patient on BiPAP with improvement in WOB and saturations back to 100%.   By time of arrival at bedside patient was already on bipap and appeared much more comfortable. Would grunt and squeeze hand on command but otherwise not able to communicate.  ABG 7.14/50.5/114/16.7  Physical Exam: Vitals BP 120s/70s HR 100 HEENT: unable to appreciate JVD  CV: RRR, no M/R/G Resp: On bipap; diffuse rhonchorous breath sounds. No increased work of breathing Ext: right foot wound dehisced and purulent; 1+ pitting edema  CXR with increased bil infiltrates  Assessment & Plan: Patient in acute hypoxic and hypercarbic respiratory failure; differential includes volume overload vs TRALI. Patient DNI per previous admission and per [facility] paperwork sent with patient. [facility] contact stated he has no family and only listed contact in our system is a friend, Mateo Flow.  --transfer to stepdown unit --Lasix 80mg  once --Bipap --will re-eval and attempt to wean off bipap in about 1 hr with repeat ABG --f/u labs (so far unable to be drawn by phlebotomy after multiple tries).   *Addendum: On reassessment, patient awake and alert, comfortable on BiPAP. We were able to wean him to RA gradually with O2 sats maintaining in mid 90s. Patient states the last thing he remembers is seeing Dr. Bradd Burner and being told he needs to go to the hospital because of his foot infection which has been worsening; he also remembers being rolled through the hospital and a lot of sticks for blood work. He endorses improved and comfortable breathing and that he is feeling better. He had not urinated yet after lasix but felt that he needed to urinate.  We discussed his wishes for code status; he states  he remembers the conversation from previous admission and didn't want intubation if it was not going to help. I asked if he would be ok with intubation if the cause was possibly reversible and he is in agreement with this; he expressed that he does not want life-prolonging measures if it was futile. I will change his code status to FULL. Patient is not altered at this time and stated understanding of the code status.  RN updated that patient was on RA currently. BiPAP available in room if needed again. Ok with ice chips but will keep patient NPO in case he needs BiPAP again soon.  Alphonzo Grieve, MD IMTS - PGY 2 Pager [phone]

## 2018-03-27 NOTE — Progress Notes (Signed)
Pt's bed alarm was going off. When RN came to the room pt's oxygen start to decline from 92% on RA to 82%-84%. Pt was placed to 6L of oxygen via [place] and encouraged to cough. Oxygen levels were not improving. RN called the resp team and rapid response. MD was notified.

## 2018-03-27 NOTE — Plan of Care (Signed)
Care plans reviewed and patient is progressing.  

## 2018-03-27 NOTE — Progress Notes (Addendum)
I was summoned to patient's room by RN due to patient's SpO2 declining. Upon entering room patient was found diaphoretic on a NRB with increased WOB. Rapid and MD notified, NIV initiated and ABG obtain. CXR pending. Patient currently on NIV with decreased WOB and 100% SpO2.   ABG Results for Leslie [name], Leslie [name] (MRN [id number]) as of 03/27/2018 03:11  Ref. Range 03/27/2018 02:24  pH, Arterial Latest Ref Range: 7.350 - 7.450  7.147 (LL)  pCO2 arterial Latest Ref Range: 32.0 - 48.0 mmHg 50.5 (H)  pO2, Arterial Latest Ref Range: 83.0 - 108.0 mmHg 114 (H)  Acid-base deficit Latest Ref Range: 0.0 - 2.0 mmol/L 10.6 (H)  Bicarbonate Latest Ref Range: 20.0 - 28.0 mmol/L 16.7 (L)  O2 Saturation Latest Units: % 96.5  Patient temperature Unknown 98.6

## 2018-03-27 NOTE — Progress Notes (Signed)
Pharmacy Antibiotic Note  Leslie [name] is a 72 y.o. male presenting with hypotension and lethargy s/p partial R foot amputation, scheduled for R BKA in 2 weeks. Pharmacy consulted to dose vancomycin and cefepime - day #2. WBC 20.2 stable, Tmax/24h 101.1. Scr 2.13 on admit, now down to 1.58 (BL ~1), estimated CrCl ~45 mL/min.  Plan: Change vancomycin to 750mg  IV q12h for improving renal function Cefepime 2g IV q24h Monitor clinical progress, c/s, renal function F/u de-escalation plan/LOT, vancomycin trough as indicated  Weight: 213 lb 3.2 oz (96.7 kg)  Temp (24hrs), Avg:99.6 F (37.6 C), Min:98.3 F (36.8 C), Max:101.1 F (38.4 C)  Recent Labs  Lab 03/26/18 1324 03/26/18 1344 03/26/18 1553 03/27/18 0320  WBC 19.8*  --   --  20.2*  CREATININE 2.13*  --   --  1.58*  LATICACIDVEN  --  2.58* 1.89  --     Estimated Creatinine Clearance: 44.4 mL/min (A) (by C-G formula based on SCr of 1.58 mg/dL (H)).    Allergies  Allergen Reactions  . Atorvastatin Other (See Comments)    "Allergic," per paperwork from facility  . Statins Other (See Comments)    "Allergic," per MAR  . Flexeril [Cyclobenzaprine] Other (See Comments)    Dry mouth and hallucinations    Antimicrobials this admission: Vanc 5/31 >>  Zosyn 5/31 x1 Cefepime 5/31 >>   Dose adjustments this admission: Empirically adjust vanc 6/1 for improving 6/1 (only loading dose given so far)  Microbiology results: 5/31 BCx: pending  [name], PharmD, BCPS Clinical Pharmacist Clinical phone for 03/27/2018 until 3:30pm: [phone] If after 3:30pm, please call main pharmacy at: [phone] 03/27/2018 11:44 AM

## 2018-03-27 NOTE — Significant Event (Signed)
Rapid Response Event Note  Overview: Time Called: 0206 Arrival Time: 0218 Event Type: Respiratory distress   Initial Focused Assessment: Called by nursing staff for this patient in respiratory distress while I was on another call.  They stated he desaturated to the 60s, tachypneic and diaphoretic.  Patient is reportedly DNI. They called Respiratory therapy and they were at the bedside.  Upon my arrival, Leslie [name] was lying in the bed on Bipap with sats 100%.  He is arousable only to painful stimuli.  He will follow simple commands intermittently.  BBS Rhonchorous with diminished bases. Some abdominal accessory muscle use with the Bipap.  HR 101, 127/60, RR 20, sats 100% on 20/8 IPAP/EPAP with fio2 100%.  Pts skin is warm and clammy. Cap refill is less than 3 secs.  Open wound with purulent drainage to Right foot. IMTS has been notified by nursing staff.   Interventions: -Bipap, titrate o2 for sats >92% -Aspiration precautions -ABG  (7.14/50/114/16.7 at initiation of Bipap) -PCXR -Lasix 80 mg IV per Dr. Jari Favre -Tx to SDU    Madelynn Done

## 2018-03-27 NOTE — Progress Notes (Signed)
Spoke with Dr. Sharol Given. Pt is okay to eat, carb modified diet. Plans to do surgery tomorrow - NPO at midnight   Fritz Pickerel, RN

## 2018-03-27 NOTE — Progress Notes (Signed)
  Date: 03/27/2018  Leslie [name] name: Leslie [name]  Medical record number: [id number]  Date of birth: Mar 28, 1946   I have seen and evaluated Leslie [name] and discussed their care with the Residency Team. Briefly, Leslie [name] is a 72yo man with hypotension and lethargy.  Leslie [name] was recently admitted for cellulitis at a surgical wound on the right foot.  Leslie [name] had surgery on 4/29 and this had not healed well.  Leslie [name] did not need surgical correction at this hospitalization, but was treated with a course of Abx and sent home.  Leslie [name] continued to have progressive wound dehiscence and was scheduled for surgery this upcoming week.  However, Leslie [name] developed worsening lethargy, sedation and low BP and presented to the hospital.  Leslie [name] was found to have a mild lactic acidosis, Hgb of 6.6 and new Xrays concerning fro osteomyelitis.  Leslie [name] was given 4.5 L of fluid in the ED and 2 units of blood.  Leslie [name] subsequently developed fever and SOB. His Xray did not show acute lung injury, but did show new fluid overload.  Leslie [name] required bipap and lasix IV and recovered.  When I saw him this morning Leslie [name] was saturating at 97% on room air.    PMHx, Fam Hx, and/or Soc Hx : Leslie [name] has a history of DM, COPD.  Leslie [name] is currently a PACE Leslie [name].  Leslie [name] has been DNR in the past, and most recently listed as partial code. The team confirmed FULL code with him today, however, will need to further discuss with family and PACE as well during the week.    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  Leslie [name] has a mild superficial wound anteriorly on the left.  Right tranmetatarsal amputation with  dehiscence (see pictures in Dr. Allayne Gitelman note from 5/31).  Psych: Awake, alert, oriented to person, place, time.   Pertinent data.   Bicarb 18 BUN 31 Cr 1.58 (down from 2.13 on admission) Ca 7.6 with albumin of 1.9  WBC 20 Hgb 8.2 Iron 23, ferritin 424  Cultures 5/31: BC X 2 - in process  Assessment and Plan: I have seen and evaluated the Leslie [name] as outlined above. I agree with the formulated Assessment and Plan as detailed in the residents' note, with the following changes:   1. Sepsis 2/2 right foot OM and overlying cellulitis - Orthopedics consulted today - BC X 2 pending - Cefepime ongoing currently, would be wary for any worsening and consider adding coverage for MRSA if Leslie [name] not improving - Home norco for pain  2. Acute on chronic normocytic anemia - Likely combination of iron deficiency and AOCD - s/p transfusion reaction with acute febrile reaction and volume overload, check LDH and repeat CXR.  Leslie [name] much improved this AM with lasix dose.  Monitor closely for any delayed reactions, further fevers.    - Trend CBC  3. AKI - Improved with fluids, continue to trend, hold vancomycin and other renally dosed medications if possible  Other issues per residents daily notes.    Sid Falcon, MD 6/1/20199:34 AM

## 2018-03-27 NOTE — Consult Note (Signed)
ORTHOPAEDIC CONSULTATION  REQUESTING PHYSICIAN: Sid Falcon, MD  Chief Complaint: Dehiscence right transmetatarsal amputation.  HPI: Leslie [name] is a 72 y.o. male who presents with dehiscence of the right transmetatarsal amputation.  Patient has diabetic insensate neuropathy peripheral vascular disease with severe protein caloric malnutrition who has been walking on his foot and has had progressive dehiscence of the wound.  Past Medical History:  Diagnosis Date  . Anxiety    Panic attack - 10  years aago- ? panic   . Asthma   . Cellulitis of left foot 2016   Healed and recurred 01/2016  . Cellulitis of right foot 06/30/2012  . Chronic lower back pain 07/01/2012  . COPD (chronic obstructive pulmonary disease) (HCC)    Uses nebulizers at home. Former smoker, current chewing tobacco. 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  Marland Kitchen GERD (gastroesophageal reflux disease)   . Gouty arthritis 07/01/2012   Not attacks in ~5 yrs (03/2016)  . Hepatitis 1967   "in jail when I got it; dr said it was pretty bad; quaranteened X 30d"  . Hyperlipidemia   . Hypertension   . Lung nodule, solitary 07/2015   Due for 1-yr recheck in 07/2017  . Peripheral arterial disease ([place])    critical limb ischemia  . Peripheral neuropathy 07/01/2012  . Seasonal allergies   . Shortness of breath dyspnea   . Skin growth 07/01/2012   right side of nares; "been on there 20 years"  . Substance abuse ([place])   . Type II diabetes mellitus ([place]) 2002   Diagnosed in 2002   Past Surgical History:  Procedure Laterality Date  . AMPUTATION Left 03/28/2016   Procedure: AMPUTATION left foot 4th and 5 th ray;  Surgeon: Newt Minion, MD;  Location: [facility];  Service: Orthopedics;  Laterality: Left;  . AMPUTATION Left 04/11/2016   Procedure: LEFT BELOW KNEE AMPUTATION;  Surgeon: Newt Minion, MD;  Location: [facility];  Service: Orthopedics;  Laterality: Left;  . AMPUTATION Right 01/22/2018   Procedure: RIGHT FOOT 3RD RAY AMPUTATION;  Surgeon: Newt Minion, MD;  Location: [facility];  Service: Orthopedics;  Laterality: Right;  . AMPUTATION Right 02/19/2018   Procedure: RIGHT TRANSMETATARSAL AMPUTATION;  Surgeon: Newt Minion, MD;  Location: [facility];  Service: Orthopedics;  Laterality: Right;  . LEG AMPUTATION BELOW KNEE Left 04/11/2016  . LOWER EXTREMITY ANGIOGRAM N/A 04/18/2013   Procedure: LOWER EXTREMITY ANGIOGRAM;  Surgeon: Sherren Mocha, MD;  Location[facility] C[facility] LAB;  Service: Cardiovascular;  Laterality: N/A;  . LOWER EXTREMITY ANGIOGRAM  08/13/2015   bilateral iliac   . PERIPHERAL VASCULAR CATHETERIZATION Bilateral 08/13/2015   Procedure: Lower Extremity Angiography;  Surgeon: Lorretta Harp, MD;  Location: [facility] CV LAB;  Service: Cardiovascular;  Laterality: Bilateral;  . PERIPHERAL VASCULAR CATHETERIZATION N/A 08/13/2015   Procedure: Abdominal Aortogram;  Surgeon: Lorretta Harp, MD;  Location: [facility] CV LAB;  Service: Cardiovascular;  Laterality: N/A;  . TOE AMPUTATION Right 01/22/2018   3RD TOE RIGHT FOOT   Social History   Socioeconomic History  . Marital status: Widowed    Spouse name: Not on file  . Number of children: Not on file  . Years of education: Not on file  . Highest education level: Not on file  Occupational History  . Occupation: disabled  Social Needs  . Financial resource strain: Not on file  . Food insecurity:    Worry: Not on file    Inability: Not on file  .  Transportation needs:    Medical: Not on file    Non-medical: Not on file  Tobacco Use  . Smoking status: Former Smoker    Packs/day: 2.00    Years: 40.00    Pack years: 80.00    Types: Cigarettes    Last attempt to quit: 01/13/2002    Years since quitting: 16.2  . Smokeless tobacco: Current User    Types: Chew  . Tobacco comment: chews a little tobacco  Substance and Sexual Activity  . Alcohol use: Not Currently    Comment: 03/03/18: "Used to drink alot. No alcohol in a long time."  . Drug use: No    Types: Marijuana     Comment: 03/27/2016 "last marijuana was maybe in 2013"  . Sexual activity: Never  Lifestyle  . Physical activity:    Days per week: Not on file    Minutes per session: Not on file  . Stress: Not on file  Relationships  . Social connections:    Talks on phone: Not on file    Gets together: Not on file    Attends religious service: Not on file    Active member of club or organization: Not on file    Attends meetings of clubs or organizations: Not on file    Relationship status: Not on file  Other Topics Concern  . 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   Lives in assisted living. Performs ADLs and IADLs, including cooking and managing his medications. Friend drives him to the store. Uses rollator walker at home and motorized scooter in store.   Pace patient   Widowed   5-8 years education   Likes to fish   Pets:  4 cockatiels and 2 lovebirds   Family History  Problem Relation Age of Onset  . Diabetes Sister   . Hypertension Mother    - negative except otherwise stated in the family history section Allergies  Allergen Reactions  . Atorvastatin Other (See Comments)    "Allergic," per paperwork from facility  . Statins Other (See Comments)    "Allergic," per MAR  . Flexeril [Cyclobenzaprine] Other (See Comments)    Dry mouth and hallucinations   Prior to Admission medications   Medication Sig Start Date End Date Taking? Authorizing Provider  albuterol (PROVENTIL HFA;VENTOLIN HFA) 108 (90 Base) MCG/ACT inhaler Inhale 2 puffs into the lungs 4 (four) times daily as needed for wheezing or shortness of breath.    Yes [provider]  albuterol (PROVENTIL) (2.5 MG/3ML) 0.083% nebulizer solution Take 2.5 mg by nebulization every 6 (six) hours as needed for wheezing or shortness of breath.   Yes [provider]  guaifenesin (GERI-TUSSIN) 100 MG/5ML syrup Take 100 mg by mouth every 4  (four) hours as needed for cough.   Yes [provider]  guaifenesin (SILTUSSIN SA) 100 MG/5ML syrup Take 100 mg by mouth every 4 (four) hours as needed for cough.   Yes [provider]  HYDROcodone-acetaminophen (NORCO) 10-325 MG tablet Take 1 tablet by mouth 3 (three) times daily.   Yes [provider]  Menthol, Topical Analgesic, (BIOFREEZE) 4 % GEL Apply 1 application topically 2 (two) times daily.   Yes [provider]  metFORMIN (GLUCOPHAGE) 1000 MG tablet Take 1 tablet (1,000 mg total) by mouth 2 (two) times daily with a meal. 08/16/15  Yes Eileen Stanford, PA-C  nitroGLYCERIN (NITROSTAT) 0.4 MG SL tablet  Place 0.4 mg under the tongue every 5 (five) minutes x 3 doses as needed for chest pain.  11/12/11  Yes Funches, Josalyn, MD  pregabalin (LYRICA) 150 MG capsule Take 150 mg by mouth 3 (three) times daily.    Yes [provider]  senna-docusate (SENEXON-S) 8.6-50 MG tablet Take 2 tablets by mouth 2 (two) times daily.   Yes [provider]  Skin Protectants, Misc. (MINERIN) CREA Apply 1 application topically 2 (two) times daily.   Yes [provider]  Sodium Phosphates (RA SALINE ENEMA) 19-7 GM/118ML ENEM Place 1 enema rectally once as needed (for constipation not relieved by Dulcolax suppository and call MD if no relief from enema).   Yes [provider]  allopurinol (ZYLOPRIM) 100 MG tablet Take 100 mg by mouth daily.    [provider]  amLODipine (NORVASC) 10 MG tablet Take 1 tablet (10 mg total) by mouth daily. 08/09/12   Funches, Adriana Mccallum, MD  antiseptic oral rinse (BIOTENE) LIQD 10 mLs by Mouth Rinse route daily.    [provider]  aspirin 81 MG tablet Take 81 mg by mouth daily.    [provider]  bisacodyl (DULCOLAX) 10 MG suppository Place 10 mg rectally once as needed (for constipation not relieved by Milk of Magnesia).    [provider]  carbamazepine (TEGRETOL) 200 MG  tablet Take 200-300 mg by mouth See admin instructions. Take 200 mg by mouth in the morning and 300 mg in the evening    [provider]  Cholecalciferol (VITAMIN D-3) 1000 units CAPS Take 1,000 Units by mouth daily.    [provider]  clonazePAM (KLONOPIN) 1 MG tablet Take 1 mg by mouth at bedtime.     [provider]  DULoxetine (CYMBALTA) 60 MG capsule Take 60 mg by mouth daily.    [provider]  furosemide (LASIX) 20 MG tablet Take 20 mg by mouth daily.    [provider]  hydrALAZINE (APRESOLINE) 50 MG tablet Take 50 mg by mouth 3 (three) times daily.    [provider]  insulin aspart (NOVOLOG FLEXPEN) 100 UNIT/ML FlexPen Inject 0-12 Units into the skin See admin instructions. Inject into the skin three times a day before meals, per sliding scale: BGL <200 = 0 units; 200-299 = 6 units; 300-399 = 9 units; > or equal to 400 = 12 units    [provider]  insulin glargine (LANTUS) 100 UNIT/ML injection Inject 20 Units into the skin daily.     [provider]  losartan (COZAAR) 100 MG tablet Take 100 mg by mouth daily.    [provider]  magnesium hydroxide (MILK OF MAGNESIA) 400 MG/5ML suspension Take 30 mLs by mouth once as needed for mild constipation.    [provider]  metoprolol succinate (TOPROL-XL) 100 MG 24 hr tablet Take 200 mg by mouth 2 (two) times daily. Take with or immediately following a meal.    [provider]  mirtazapine (REMERON) 7.5 MG tablet Take 7.5 mg by mouth at bedtime.    [provider]  Multiple Vitamins-Minerals (CERTAVITE SENIOR/ANTIOXIDANT) TABS Take 1 tablet by mouth daily.    [provider]  nystatin (NYSTATIN) powder Apply topically to affected area three times a day for jock itch for 7 days    [provider]  pentoxifylline (TRENTAL) 400 MG CR tablet Take 1 tablet (400 mg total) by mouth 3 (three) times daily with meals. 02/04/18    Meridee Score  V, MD  ranitidine (ZANTAC) 300 MG capsule Take 300 mg by mouth at bedtime.     [provider]   Dg Chest 2 View  Result Date: 03/26/2018 CLINICAL DATA:  Altered mental status. EXAM: CHEST - 2 VIEW COMPARISON:  03/03/2018 FINDINGS: The patient is rotated to the right. The cardiac silhouette remains enlarged. Aortic atherosclerosis is noted. The lungs are mildly hypoinflated, and there is motion artifact on the lateral radiograph. No confluent airspace opacity, overt pulmonary edema, sizable pleural effusion, or pneumothorax is identified. No acute osseous abnormality is seen. IMPRESSION: No active cardiopulmonary disease. Electronically Signed   By: Logan Bores M.D.   On: 03/26/2018 15:12   Dg Chest Port 1 View  Result Date: 03/27/2018 CLINICAL DATA:  72 y/o  M; acute respiratory distress. EXAM: PORTABLE CHEST 1 VIEW COMPARISON:  03/26/2018 chest radiograph FINDINGS: Stable enlarged cardiac silhouette given projection and technique. Aortic atherosclerosis with calcification. Reticular and hazy opacities of the lungs with basilar predominance. Suspected small effusions. Mild reverse S curvature of the spine. IMPRESSION: Reticular and hazy opacities of the lungs with basilar predominance, probably representing pulmonary edema, possibly pneumonia. Suspected small effusions. Electronically Signed   By: Kristine Garbe M.D.   On: 03/27/2018 03:00   Dg Foot Complete Right  Result Date: 03/26/2018 CLINICAL DATA:  Open wound.  Recent amputation EXAM: RIGHT FOOT COMPLETE - 3+ VIEW COMPARISON:  Mar 03, 2018 FINDINGS: Frontal, oblique, and lateral views were obtained. There is apparent wound at the amputation site distally with a portion of the remaining proximal cuneiform bones exposed to open air. There is extensive soft tissue air present. There is cortical irregularity along the cuboid suggesting osteomyelitis in this area. Other bony structures appear intact. No acute fracture or  dislocation. There is extensive arterial vascular calcification. There are spurs arising from the posterior and inferior calcaneus. IMPRESSION: Assuming no surgery in the interval since most recent radiographs, there has been apparent breakdown of the stump area distally with portions of the remaining cuneiform bones exposed to open air. There is cortical irregularity along the lateral aspect of the cuboid bone, felt to represent osteomyelitis. There is extensive soft tissue air concerning for infection in the soft tissues of the remaining foot. No acute fracture or dislocation. Foci of arterial vascular calcification are noted, likely due to diabetes mellitus. There are posterior inferior calcaneal spurs. Electronically Signed   By: Lowella Grip III M.D.   On: 03/26/2018 15:13   - pertinent xrays, CT, MRI studies were reviewed and independently interpreted  Positive ROS: All other systems have been reviewed and were otherwise negative with the exception of those mentioned in the HPI and as above.  Physical Exam: General: Alert, no acute distress Psychiatric: Patient is competent for consent with normal mood and affect Lymphatic: No axillary or cervical lymphadenopathy Cardiovascular: No pedal edema Respiratory: No cyanosis, no use of accessory musculature GI: No organomegaly, abdomen is soft and non-tender    Images:  @ENCIMAGES @  Labs:  Lab Results  Component Value Date   HGBA1C 7.1 (H) 02/19/2018   HGBA1C 7.2 (H) 03/27/2016   HGBA1C 6.9 11/19/2012   ESRSEDRATE 90 (H) 03/27/2016   CRP 23.4 (H) 03/27/2016   LABURIC 4.8 08/09/2012   REPTSTATUS 03/08/2018 FINAL 03/03/2018   CULT  03/03/2018    NO GROWTH 5 DAYS Performed at [facility], [place] [address] [place], [place] [address]     Lab Results  Component Value Date   ALBUMIN 1.9 (  L) 03/27/2018   ALBUMIN 2.1 (L) 03/26/2018   ALBUMIN 2.9 (L) 03/03/2018   PREALBUMIN 13.8 (L) 03/27/2016   LABURIC 4.8 08/09/2012     Neurologic: Patient does not have protective sensation bilateral lower extremities.   MUSCULOSKELETAL:   Skin: Examination patient has complete dehiscence of the transmetatarsal amputation there is exposed bone.  The wound is necrotic this is gaped open 4 x 6 cm and is 3 cm deep.  Patient has no ascending cellulitis he does have swelling in the right lower extremity there is no purulent drainage no abscess.  He has a stable well-healed left transtibial amputation.  His albumin is 1.9 hemoglobin A1c 7.1.  Assessment: Assessment: Diabetic insensate neuropathy severe protein caloric malnutrition with peripheral vascular disease with dehiscence of the transmetatarsal amputation.  Plan: Plan: We will plan for a transtibial amputation tomorrow risk and benefits were discussed including risk of the wound not healing.  Patient states he understands wished to proceed.  Patient will need discharge to skilled nursing.  Thank you for the consult and the opportunity to see Mr. Nadeem Romanoski, [place] [phone] 11:43 AM

## 2018-03-28 ENCOUNTER — Inpatient Hospital Stay (HOSPITAL_COMMUNITY): Payer: Medicare (Managed Care) | Admitting: Anesthesiology

## 2018-03-28 ENCOUNTER — Other Ambulatory Visit: Payer: Self-pay

## 2018-03-28 ENCOUNTER — Encounter (HOSPITAL_COMMUNITY)
Admission: EM | Disposition: A | Discharge status: Skilled Nursing Facility | Payer: Self-pay | Source: Home / Self Care | Attending: Internal Medicine

## 2018-03-28 ENCOUNTER — Encounter (HOSPITAL_COMMUNITY): Payer: Self-pay | Admitting: Anesthesiology

## 2018-03-28 ENCOUNTER — Inpatient Hospital Stay (HOSPITAL_COMMUNITY)
Admission: RE | Admit: 2018-03-28 | Payer: Medicare (Managed Care) | Source: Ambulatory Visit | Admitting: Orthopedic Surgery

## 2018-03-28 DIAGNOSIS — A419 Sepsis, unspecified organism: Secondary | ICD-10-CM

## 2018-03-28 DIAGNOSIS — L089 Local infection of the skin and subcutaneous tissue, unspecified: Secondary | ICD-10-CM

## 2018-03-28 DIAGNOSIS — T8781 Dehiscence of amputation stump: Principal | ICD-10-CM

## 2018-03-28 DIAGNOSIS — M86271 Subacute osteomyelitis, right ankle and foot: Secondary | ICD-10-CM

## 2018-03-28 HISTORY — PX: AMPUTATION: SHX166

## 2018-03-28 LAB — BLOOD CULTURE ID PANEL (REFLEXED)
ACINETOBACTER BAUMANNII: NOT DETECTED
CANDIDA ALBICANS: NOT DETECTED
CANDIDA GLABRATA: NOT DETECTED
Candida krusei: NOT DETECTED
Candida parapsilosis: NOT DETECTED
Candida tropicalis: NOT DETECTED
ENTEROBACTER CLOACAE COMPLEX: NOT DETECTED
ENTEROBACTERIACEAE SPECIES: NOT DETECTED
Enterococcus species: NOT DETECTED
Escherichia coli: NOT DETECTED
Haemophilus influenzae: NOT DETECTED
Klebsiella oxytoca: NOT DETECTED
Klebsiella pneumoniae: NOT DETECTED
Listeria monocytogenes: NOT DETECTED
NEISSERIA MENINGITIDIS: NOT DETECTED
PSEUDOMONAS AERUGINOSA: NOT DETECTED
Proteus species: NOT DETECTED
STREPTOCOCCUS AGALACTIAE: NOT DETECTED
STREPTOCOCCUS PNEUMONIAE: NOT DETECTED
Serratia marcescens: NOT DETECTED
Staphylococcus aureus (BCID): NOT DETECTED
Staphylococcus species: NOT DETECTED
Streptococcus pyogenes: NOT DETECTED
Streptococcus species: NOT DETECTED

## 2018-03-28 LAB — GLUCOSE, CAPILLARY
GLUCOSE-CAPILLARY: 152 mg/dL — AB (ref 65–99)
GLUCOSE-CAPILLARY: 185 mg/dL — AB (ref 65–99)
Glucose-Capillary: 132 mg/dL — ABNORMAL HIGH (ref 65–99)
Glucose-Capillary: 162 mg/dL — ABNORMAL HIGH (ref 65–99)

## 2018-03-28 LAB — BASIC METABOLIC PANEL
Anion gap: 16 — ABNORMAL HIGH (ref 5–15)
BUN: 22 mg/dL — ABNORMAL HIGH (ref 6–20)
CALCIUM: 7.9 mg/dL — AB (ref 8.9–10.3)
CHLORIDE: 105 mmol/L (ref 101–111)
CO2: 17 mmol/L — AB (ref 22–32)
CREATININE: 1.42 mg/dL — AB (ref 0.61–1.24)
GFR calc non Af Amer: 48 mL/min — ABNORMAL LOW (ref >60)
GFR, EST AFRICAN AMERICAN: 55 mL/min — AB (ref >60)
GLUCOSE: 172 mg/dL — AB (ref 65–99)
Potassium: 3.5 mmol/L (ref 3.5–5.1)
Sodium: 138 mmol/L (ref 135–145)

## 2018-03-28 LAB — CBC
HCT: 26.4 % — ABNORMAL LOW (ref 39.0–52.0)
Hemoglobin: 8.2 g/dL — ABNORMAL LOW (ref 13.0–17.0)
MCH: 25.5 pg — AB (ref 26.0–34.0)
MCHC: 31.1 g/dL (ref 30.0–36.0)
MCV: 82.2 fL (ref 78.0–100.0)
PLATELETS: 413 10*3/uL — AB (ref 150–400)
RBC: 3.21 MIL/uL — ABNORMAL LOW (ref 4.22–5.81)
RDW: 17.2 % — AB (ref 11.5–15.5)
WBC: 16.6 10*3/uL — ABNORMAL HIGH (ref 4.0–10.5)

## 2018-03-28 LAB — LACTATE DEHYDROGENASE: LDH: 190 U/L (ref 98–192)

## 2018-03-28 SURGERY — AMPUTATION BELOW KNEE
Anesthesia: General | Site: Leg Lower | Laterality: Right

## 2018-03-28 MED ORDER — PROMETHAZINE HCL 25 MG/ML IJ SOLN: 6.2500 mg | INTRAMUSCULAR | Status: DC | PRN

## 2018-03-28 MED ORDER — OXYCODONE HCL 5 MG PO TABS
10.0000 mg | ORAL_TABLET | ORAL | Status: DC | PRN
  Administered 2018-03-29 – 2018-03-30 (×5): 15 mg via ORAL
  Filled 2018-03-28 (×5): qty 3

## 2018-03-28 MED ORDER — ROPIVACAINE HCL 5 MG/ML IJ SOLN
INTRAMUSCULAR | Status: DC | PRN
  Administered 2018-03-28: 20 mL via PERINEURAL

## 2018-03-28 MED ORDER — PROPOFOL 10 MG/ML IV BOLUS
INTRAVENOUS | Status: AC
  Filled 2018-03-28: qty 20

## 2018-03-28 MED ORDER — DEXAMETHASONE SODIUM PHOSPHATE 10 MG/ML IJ SOLN
INTRAMUSCULAR | Status: AC
  Filled 2018-03-28: qty 1

## 2018-03-28 MED ORDER — LACTATED RINGERS IV SOLN
INTRAVENOUS | Status: DC | PRN
  Administered 2018-03-28 (×2): via INTRAVENOUS

## 2018-03-28 MED ORDER — INSULIN GLARGINE 100 UNIT/ML ~~LOC~~ SOLN
10.0000 [IU] | Freq: Every day | SUBCUTANEOUS | Status: DC
  Administered 2018-03-28: 10 [IU] via SUBCUTANEOUS
  Filled 2018-03-28: qty 0.1

## 2018-03-28 MED ORDER — ONDANSETRON HCL 4 MG/2ML IJ SOLN
INTRAMUSCULAR | Status: DC | PRN
  Administered 2018-03-28: 4 mg via INTRAVENOUS

## 2018-03-28 MED ORDER — PROPOFOL 10 MG/ML IV BOLUS
INTRAVENOUS | Status: DC | PRN
  Administered 2018-03-28: 170 mg via INTRAVENOUS

## 2018-03-28 MED ORDER — POLYETHYLENE GLYCOL 3350 17 G PO PACK: 17.0000 g | PACK | Freq: Every day | ORAL | Status: DC | PRN

## 2018-03-28 MED ORDER — SODIUM CHLORIDE 0.9 % IV SOLN
INTRAVENOUS | Status: DC
  Administered 2018-03-28: 19:00:00 via INTRAVENOUS

## 2018-03-28 MED ORDER — METHOCARBAMOL 1000 MG/10ML IJ SOLN
500.0000 mg | Freq: Four times a day (QID) | INTRAVENOUS | Status: DC | PRN
  Filled 2018-03-28: qty 5

## 2018-03-28 MED ORDER — FENTANYL CITRATE (PF) 250 MCG/5ML IJ SOLN
INTRAMUSCULAR | Status: AC
  Filled 2018-03-28: qty 5

## 2018-03-28 MED ORDER — BUPIVACAINE HCL (PF) 0.5 % IJ SOLN
INTRAMUSCULAR | Status: DC | PRN
  Administered 2018-03-28: 30 mL via PERINEURAL

## 2018-03-28 MED ORDER — CLONIDINE HCL (ANALGESIA) 100 MCG/ML EP SOLN
EPIDURAL | Status: DC | PRN
  Administered 2018-03-28 (×2): 50 ug

## 2018-03-28 MED ORDER — SODIUM CHLORIDE 0.9 % IJ SOLN
INTRAMUSCULAR | Status: AC
  Filled 2018-03-28: qty 10

## 2018-03-28 MED ORDER — 0.9 % SODIUM CHLORIDE (POUR BTL) OPTIME
TOPICAL | Status: DC | PRN
  Administered 2018-03-28: 1000 mL

## 2018-03-28 MED ORDER — DEXAMETHASONE SODIUM PHOSPHATE 4 MG/ML IJ SOLN
INTRAMUSCULAR | Status: DC | PRN
  Administered 2018-03-28: 4 mg via INTRAVENOUS

## 2018-03-28 MED ORDER — MIDAZOLAM HCL 2 MG/2ML IJ SOLN
INTRAMUSCULAR | Status: AC
  Filled 2018-03-28: qty 2

## 2018-03-28 MED ORDER — ONDANSETRON HCL 4 MG PO TABS: 4.0000 mg | ORAL_TABLET | Freq: Four times a day (QID) | ORAL | Status: DC | PRN

## 2018-03-28 MED ORDER — HYDROMORPHONE HCL 1 MG/ML IJ SOLN: 0.5000 mg | INTRAMUSCULAR | Status: DC | PRN

## 2018-03-28 MED ORDER — ONDANSETRON HCL 4 MG/2ML IJ SOLN: 4.0000 mg | Freq: Four times a day (QID) | INTRAMUSCULAR | Status: DC | PRN

## 2018-03-28 MED ORDER — FENTANYL CITRATE (PF) 100 MCG/2ML IJ SOLN
INTRAMUSCULAR | Status: DC | PRN
  Administered 2018-03-28: 25 ug via INTRAVENOUS
  Administered 2018-03-28: 50 ug via INTRAVENOUS

## 2018-03-28 MED ORDER — ONDANSETRON HCL 4 MG/2ML IJ SOLN
INTRAMUSCULAR | Status: AC
  Filled 2018-03-28: qty 2

## 2018-03-28 MED ORDER — MAGNESIUM CITRATE PO SOLN: 1.0000 | Freq: Once | ORAL | Status: DC | PRN

## 2018-03-28 MED ORDER — FENTANYL CITRATE (PF) 100 MCG/2ML IJ SOLN: 25.0000 ug | INTRAMUSCULAR | Status: DC | PRN

## 2018-03-28 MED ORDER — LIDOCAINE HCL (CARDIAC) PF 100 MG/5ML IV SOSY
PREFILLED_SYRINGE | INTRAVENOUS | Status: DC | PRN
  Administered 2018-03-28: 20 mg via INTRAVENOUS

## 2018-03-28 MED ORDER — METOCLOPRAMIDE HCL 5 MG/ML IJ SOLN: 5.0000 mg | Freq: Three times a day (TID) | INTRAMUSCULAR | Status: DC | PRN

## 2018-03-28 MED ORDER — EPHEDRINE SULFATE 50 MG/ML IJ SOLN
INTRAMUSCULAR | Status: AC
  Filled 2018-03-28: qty 1

## 2018-03-28 MED ORDER — METOCLOPRAMIDE HCL 5 MG PO TABS: 5.0000 mg | ORAL_TABLET | Freq: Three times a day (TID) | ORAL | Status: DC | PRN

## 2018-03-28 MED ORDER — BISACODYL 10 MG RE SUPP: 10.0000 mg | Freq: Every day | RECTAL | Status: DC | PRN

## 2018-03-28 MED ORDER — METHOCARBAMOL 500 MG PO TABS
500.0000 mg | ORAL_TABLET | Freq: Four times a day (QID) | ORAL | Status: DC | PRN
  Administered 2018-03-29 – 2018-03-30 (×2): 500 mg via ORAL
  Filled 2018-03-28 (×3): qty 1

## 2018-03-28 MED ORDER — DOCUSATE SODIUM 100 MG PO CAPS
100.0000 mg | ORAL_CAPSULE | Freq: Two times a day (BID) | ORAL | Status: DC
  Administered 2018-03-28 – 2018-03-29 (×4): 100 mg via ORAL
  Filled 2018-03-28 (×5): qty 1

## 2018-03-28 MED ORDER — MIDAZOLAM HCL 5 MG/5ML IJ SOLN
INTRAMUSCULAR | Status: DC | PRN
  Administered 2018-03-28: 1 mg via INTRAVENOUS

## 2018-03-28 MED ORDER — ACETAMINOPHEN 325 MG PO TABS: 325.0000 mg | ORAL_TABLET | Freq: Four times a day (QID) | ORAL | Status: DC | PRN

## 2018-03-28 MED ORDER — LIDOCAINE 2% (20 MG/ML) 5 ML SYRINGE
INTRAMUSCULAR | Status: AC
  Filled 2018-03-28: qty 5

## 2018-03-28 MED ORDER — INSULIN ASPART 100 UNIT/ML ~~LOC~~ SOLN
0.0000 [IU] | Freq: Three times a day (TID) | SUBCUTANEOUS | Status: DC
  Administered 2018-03-28: 1 [IU] via SUBCUTANEOUS
  Administered 2018-03-28 – 2018-03-29 (×3): 3 [IU] via SUBCUTANEOUS
  Administered 2018-03-29: 5 [IU] via SUBCUTANEOUS
  Administered 2018-03-29: 2 [IU] via SUBCUTANEOUS
  Administered 2018-03-30: 3 [IU] via SUBCUTANEOUS
  Administered 2018-03-30 (×2): 5 [IU] via SUBCUTANEOUS

## 2018-03-28 MED ORDER — OXYCODONE HCL 5 MG PO TABS
5.0000 mg | ORAL_TABLET | ORAL | Status: DC | PRN
  Administered 2018-03-28: 10 mg via ORAL
  Filled 2018-03-28: qty 2

## 2018-03-28 SURGICAL SUPPLY — 40 items
BENZOIN TINCTURE PRP APPL 2/3 (GAUZE/BANDAGES/DRESSINGS) ×12 IMPLANT
BLADE SAW RECIP 87.9 MT (BLADE) ×3 IMPLANT
BLADE SURG 21 STRL SS (BLADE) ×3 IMPLANT
BNDG COHESIVE 6X5 TAN STRL LF (GAUZE/BANDAGES/DRESSINGS) ×2 IMPLANT
BNDG GAUZE ELAST 4 BULKY (GAUZE/BANDAGES/DRESSINGS) ×2 IMPLANT
CANISTER WOUND CARE 500ML ATS (WOUND CARE) ×2 IMPLANT
COVER SURGICAL LIGHT HANDLE (MISCELLANEOUS) ×3 IMPLANT
CUFF TOURNIQUET SINGLE 34IN LL (TOURNIQUET CUFF) IMPLANT
CUFF TOURNIQUET SINGLE 44IN (TOURNIQUET CUFF) ×2 IMPLANT
DRAPE INCISE IOBAN 66X45 STRL (DRAPES) ×2 IMPLANT
DRAPE U-SHAPE 47X51 STRL (DRAPES) ×3 IMPLANT
DRESSING PREVENA PLUS CUSTOM (GAUZE/BANDAGES/DRESSINGS) ×1 IMPLANT
DRSG PREVENA PLUS CUSTOM (GAUZE/BANDAGES/DRESSINGS) ×3
DURAPREP 26ML APPLICATOR (WOUND CARE) ×3 IMPLANT
ELECT REM PT RETURN 9FT ADLT (ELECTROSURGICAL) ×3
ELECTRODE REM PT RTRN 9FT ADLT (ELECTROSURGICAL) ×1 IMPLANT
FACESHIELD WRAPAROUND (MASK) ×3 IMPLANT
FACESHIELD WRAPAROUND OR TEAM (MASK) IMPLANT
GLOVE BIOGEL PI IND STRL 9 (GLOVE) ×1 IMPLANT
GLOVE BIOGEL PI INDICATOR 9 (GLOVE) ×2
GLOVE SURG ORTHO 9.0 STRL STRW (GLOVE) ×3 IMPLANT
GOWN STRL REUS W/ TWL XL LVL3 (GOWN DISPOSABLE) ×2 IMPLANT
GOWN STRL REUS W/TWL XL LVL3 (GOWN DISPOSABLE) ×4
KIT BASIN OR (CUSTOM PROCEDURE TRAY) ×3 IMPLANT
KIT TURNOVER KIT B (KITS) ×3 IMPLANT
MANIFOLD NEPTUNE II (INSTRUMENTS) ×1 IMPLANT
NS IRRIG 1000ML POUR BTL (IV SOLUTION) ×3 IMPLANT
PACK ORTHO EXTREMITY (CUSTOM PROCEDURE TRAY) ×3 IMPLANT
PAD ARMBOARD 7.5X6 YLW CONV (MISCELLANEOUS) ×3 IMPLANT
SPONGE LAP 18X18 X RAY DECT (DISPOSABLE) IMPLANT
STAPLER VISISTAT 35W (STAPLE) ×2 IMPLANT
STOCKINETTE IMPERVIOUS LG (DRAPES) ×3 IMPLANT
SUT ETHILON 2 0 PSLX (SUTURE) ×4 IMPLANT
SUT SILK 2 0 (SUTURE)
SUT SILK 2-0 18XBRD TIE 12 (SUTURE) ×1 IMPLANT
SUT VIC AB 1 CTX 27 (SUTURE) IMPLANT
TOWEL OR 17X26 10 PK STRL BLUE (TOWEL DISPOSABLE) ×3 IMPLANT
TUBE CONNECTING 12'X1/4 (SUCTIONS) ×1
TUBE CONNECTING 12X1/4 (SUCTIONS) ×2 IMPLANT
YANKAUER SUCT BULB TIP NO VENT (SUCTIONS) ×3 IMPLANT

## 2018-03-28 NOTE — Op Note (Signed)
   Date of Surgery: 03/28/2018  INDICATIONS: Mr. Van is a 72 y.o.-year-old male who has progressive dehiscence of a transmetatarsal amputation on the right foot has failed conservative wound care.  The.  PREOPERATIVE DIAGNOSIS: Dehiscence right transmetatarsal amputation.  POSTOPERATIVE DIAGNOSIS: Same.  PROCEDURE: Transtibial amputation Application of Prevena wound VAC  SURGEON: Sharol Given, M.D.  ANESTHESIA:  general  IV FLUIDS AND URINE: See anesthesia.  ESTIMATED BLOOD LOSS: 100 mL.  COMPLICATIONS: None.  DESCRIPTION OF PROCEDURE: The patient was brought to the operating room and underwent a general anesthetic. After adequate levels of anesthesia were obtained patient's lower extremity was prepped using DuraPrep draped into a sterile field. A timeout was called. The foot was draped out of the sterile field with impervious stockinette. A transverse incision was made 11 cm distal to the tibial tubercle. This curved proximally and a large posterior flap was created. The tibia was transected 1 cm proximal to the skin incision. The fibula was transected just proximal to the tibial incision. The tibia was beveled anteriorly. A large posterior flap was created. The sciatic nerve was pulled cut and allowed to retract. The vascular bundles were suture ligated with 2-0 silk. The deep and superficial fascial layers were closed using #1 Vicryl. The skin was closed using staples and 2-0 nylon. The wound was covered with a Prevena wound VAC. There was a good suction fit. A prosthetic shrinker was applied. Patient was extubated taken to the PACU in stable condition.   DISCHARGE PLANNING:  Antibiotic duration: 24 hours postoperatively  Weightbearing: Nonweightbearing right lower extremity  Pain medication: Opiate pathway high dose  Dressing care/ Wound VAC: Wound VAC do not remove for 1 week  Discharge to: Skilled nursing facility  Follow-up: In the office 1 week post operative.  Meridee Score,  MD [place] 8:42 AM

## 2018-03-28 NOTE — Progress Notes (Signed)
Pt arrived to PACU holding life alert button.

## 2018-03-28 NOTE — Transfer of Care (Signed)
Immediate Anesthesia Transfer of Care Note  Patient: Leslie [name]  Procedure(s) Performed: RIGHT BELOW KNEE AMPUTATION (Right Leg Lower)  Patient Location: PACU  Anesthesia Type:General  Level of Consciousness: awake, oriented and patient cooperative  Airway & Oxygen Therapy: Patient Spontanous Breathing and Patient connected to nasal cannula oxygen  Post-op Assessment: Report given to RN and Post -op Vital signs reviewed and stable  Post vital signs: Reviewed  Last Vitals:  Vitals Value Taken Time  BP 160/97 03/28/2018  8:47 AM  Temp    Pulse 97 03/28/2018  8:48 AM  Resp 16 03/28/2018  8:48 AM  SpO2 96 % 03/28/2018  8:48 AM  Vitals shown include unvalidated device data.  Last Pain:  Vitals:   03/28/18 0414  TempSrc: Oral  PainSc: 0-No pain         Complications: No apparent anesthesia complications

## 2018-03-28 NOTE — Anesthesia Procedure Notes (Signed)
Anesthesia Regional Block: Adductor canal block   Pre-Anesthetic Checklist: ,, timeout performed, Correct Patient, Correct Site, Correct Laterality, Correct Procedure, Correct Position, site marked, Risks and benefits discussed,  Surgical consent,  Pre-op evaluation,  At surgeon's request and post-op pain management  Laterality: Right  Prep: chloraprep       Needles:  Injection technique: Single-shot  Needle Type: Echogenic Needle     Needle Length: 9cm  Needle Gauge: 21     Additional Needles:   Procedures:,,,, ultrasound used (permanent image in chart),,,,  Narrative:  Start time: 03/28/2018 7:22 AM End time: 03/28/2018 7:28 AM Injection made incrementally with aspirations every 5 mL.  Performed by: Personally  Anesthesiologist: Suzette Battiest, MD

## 2018-03-28 NOTE — Progress Notes (Addendum)
Pt transferred to pre op holding room 36 for surgery per Alvira Monday RN and Shelda Pal. VSS, pt ao x4 at the time of transfer. CCMD notified. Report called to [facility]. Will pass it on to oncoming RN.

## 2018-03-28 NOTE — Progress Notes (Signed)
PT Cancellation Note  Patient Details Name: Leslie [name] MRN: [id number] DOB: 1946/01/09   Cancelled Treatment:    Reason Eval/Treat Not Completed: Patient not medically ready. Orders received for PT eval. Pt underwent R BKA this AM. PT to complete eval tomorrow.    Lorriane Shire 03/28/2018, 2:10 PM   Lorrin Goodell, PT  Office # [phone] Pager [phone]

## 2018-03-28 NOTE — Anesthesia Preprocedure Evaluation (Addendum)
Anesthesia Evaluation  Patient identified by MRN, date of birth, ID band Patient awake    Reviewed: Allergy & Precautions, H&P , NPO status , Patient's Chart, lab work & pertinent test results  History of Anesthesia Complications Negative for: history of anesthetic complications  Airway Mallampati: II  TM Distance: >3 FB Neck ROM: full    Dental no notable dental hx. (+) Edentulous Upper, Edentulous Lower, Dental Advisory Given   Pulmonary asthma , COPD, former smoker,    Pulmonary exam normal breath sounds clear to auscultation       Cardiovascular hypertension, Pt. on medications and Pt. on home beta blockers + Peripheral Vascular Disease  Normal cardiovascular exam Rhythm:regular Rate:Normal     Neuro/Psych negative neurological ROS  negative psych ROS   GI/Hepatic Neg liver ROS, GERD  ,  Endo/Other  diabetes, Poorly Controlled, Type 2  Renal/GU Renal InsufficiencyRenal disease  negative genitourinary   Musculoskeletal negative musculoskeletal ROS (+) Arthritis ,   Abdominal Normal abdominal exam  (+)   Peds negative pediatric ROS (+)  Hematology  (+) anemia ,   Anesthesia Other Findings   Reproductive/Obstetrics negative OB ROS                            Lab Results  Component Value Date   WBC 16.6 (H) 03/28/2018   HGB 8.2 (L) 03/28/2018   HCT 26.4 (L) 03/28/2018   MCV 82.2 03/28/2018   PLT 413 (H) 03/28/2018   Lab Results  Component Value Date   CREATININE 1.42 (H) 03/28/2018   BUN 22 (H) 03/28/2018   NA 138 03/28/2018   K 3.5 03/28/2018   CL 105 03/28/2018   CO2 17 (L) 03/28/2018    Anesthesia Physical  Anesthesia Plan  ASA: III  Anesthesia Plan: General   Post-op Pain Management:  Regional for Post-op pain   Induction: Intravenous  PONV Risk Score and Plan: 1 and 2 and Ondansetron, Midazolam and Treatment may vary due to age or medical condition  Airway  Management Planned: LMA  Additional Equipment:   Intra-op Plan:   Post-operative Plan: Extubation in OR  Informed Consent: I have reviewed the patients History and Physical, chart, labs and discussed the procedure including the risks, benefits and alternatives for the proposed anesthesia with the patient or authorized representative who has indicated his/her understanding and acceptance.   Dental Advisory Given  Plan Discussed with: Anesthesiologist, CRNA and Surgeon  Anesthesia Plan Comments:         Anesthesia Quick Evaluation

## 2018-03-28 NOTE — Progress Notes (Signed)
  Date: 03/28/2018  Patient name: Leslie [name]  Medical record number: [id number]  Date of birth: 1945-12-23   This patient's plan of care was discussed with the house staff. Please see Dr. Rober Minion note for complete details. I concur with her findings.   Sid Falcon, MD 03/28/2018, 7:48 PM

## 2018-03-28 NOTE — Interval H&P Note (Signed)
History and Physical Interval Note:  03/28/2018 7:42 AM  Leslie [name]  has presented today for surgery, with the diagnosis of dehiscence right transmetatarsal amputation  The various methods of treatment have been discussed with the patient and family. After consideration of risks, benefits and other options for treatment, the patient has consented to  Procedure(s): RIGHT BELOW KNEE AMPUTATION (Right) as a surgical intervention .  The patient's history has been reviewed, patient examined, no change in status, stable for surgery.  I have reviewed the patient's chart and labs.  Questions were answered to the patient's satisfaction.     Newt Minion

## 2018-03-28 NOTE — Anesthesia Postprocedure Evaluation (Signed)
Anesthesia Post Note  Patient: Leslie [name]  Procedure(s) Performed: RIGHT BELOW KNEE AMPUTATION (Right Leg Lower)     Patient location during evaluation: PACU Anesthesia Type: General Level of consciousness: awake and alert Pain management: pain level controlled Vital Signs Assessment: post-procedure vital signs reviewed and stable Respiratory status: spontaneous breathing, nonlabored ventilation, respiratory function stable and patient connected to nasal cannula oxygen Cardiovascular status: blood pressure returned to baseline and stable Postop Assessment: no apparent nausea or vomiting Anesthetic complications: no    Last Vitals:  Vitals:   03/28/18 0846 03/28/18 0900  BP: (!) 160/97 (!) 159/65  Pulse: 97 95  Resp: 17 15  Temp: 36.9 C   SpO2: 92% 94%    Last Pain:  Vitals:   03/28/18 0846  TempSrc:   PainSc: 0-No pain                 Tiajuana Amass

## 2018-03-28 NOTE — Plan of Care (Signed)
Care plans reviewed and patient is progressing.  

## 2018-03-28 NOTE — Progress Notes (Signed)
   Subjective:  Leslie [name] was seen resting in his bed this morning. No acute complaints, ready to get amputation "overwith." No chest pain or SOB overnight.   Objective:  Vital signs in last 24 hours: Vitals:   03/28/18 0939 03/28/18 1021 03/28/18 1130 03/28/18 1230  BP: (!) 155/66 (!) 148/71 (!) 154/74 137/70  Pulse: 91 89    Resp: 18 18    Temp: 98.9 F (37.2 C) 98.4 F (36.9 C)    TempSrc:  Oral    SpO2:  96%    Weight:       Physical Exam  Constitutional: He appears well-developed and well-nourished. No distress.  HENT:  Head: Normocephalic and atraumatic.  Cardiovascular: Normal rate, regular rhythm, normal heart sounds and intact distal pulses.  Pulmonary/Chest: Effort normal. He has no wheezes.  Abdominal: Soft. Bowel sounds are normal. There is no tenderness.  Obese abdomen  Musculoskeletal: He exhibits no edema.  R Transmet amputaiton wound with clean bandage in place S/P L BKA No Edema noted   Neurological: He is alert.  Skin: Skin is warm and dry.   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 1/2 blood cultures are also growing GNR.   Sepsis 2/2 right foot osteomyelitis and overlying cellulitis Febrile yesterday evening to 100.5. Underwent successful right BKA with orthopedics this morning. 1/2 blood cultures resulted this afternoon with GNR which were not identified on BioFire. Called by pharmacy that recommended de-escalating to IV Cefepime, however will continue both antibiotics for 24 hours after procedure. -Repeat blood cultures tomorrow -Await speciation of blood culture; could be contaminant but patient came in septic -Continue both IV Vancomycin and Cefepime, will likely de-escalate tomorrow -IV dilaudid 0.5-1mg  Q4H prn uncontrolled pain with PO Oxycodone.  -PT on, will evaluate  patient tomorrow for placement -Will go ahead and ask for SW assistance with SNF placement  Acute on chronic normocytic anemia Hb 6.6 on admission with a baseline ~9-10. Responded appropriately to 2 units PRBs on admission. Stable at 8.2 today and will repeat CBC tomorrow AM. FYI patient developed fever during transfusion of his second unit on admission, followed by likely TACO.  AKI: Cr elevated at 2.13 on admission with a normal baseline. Improved with IVF. Will repeat tomorrow. -BMP in AM  Acute hypoxic and hypercarbic respiratory failure: Following fluid and blood resuscitation on admission. Responded well to IV Lasix 80mg  x1 and is continuing to breath comfortably without supplemental O2.  -Resolved, Continue to monitor - PRN BiPAP  - Will give Lasix PRN if further respiratory failure developes  DM: Home regimen: Metformin 1000mg  BID, Lantus 20 units daily and Novolog SSI at home. On modified regimen here and CBGs within goal. Will work towards maintaining glycemic control to promote healing.  - SSI-M TID WC - Pregabalin 100mg  TID  HTN: Home medications held on admission given hypotension. Usually takes Hydralazine 50mg  TID, Lasix 20mg  daily, Amlodipine 10mg  daily, Losartan 100mg  daily, and Metoprolol 200mg  BID. BP improved, and was slightly hypertensive overnight but has resolved. He appears euvolemic on exam.  -Gradually resume anti-hypertensive as needed, caution with ARB given AKI  Dispo: Anticipated discharge in 1-2 days to facility once bed available.   Leslie Lim, DO 03/28/2018, 3:51 PM Pager: [phone]

## 2018-03-28 NOTE — Progress Notes (Signed)
Patient received from PACU.  A,A,O x4,  Placed on cardiac monitor, VSS.  O2 via [place] at 3 l.  BBS clear thru out.  LR infusing via left AC at kvo.  Dressing to right lower bka clean dry and intact.  Connected to wound vac at 147mmhg.  No drainage in tubing.  Right leg with ice to stump is elevated on pillow.  Patient denies any pain at this time.

## 2018-03-28 NOTE — Progress Notes (Signed)
PHARMACY - PHYSICIAN COMMUNICATION CRITICAL VALUE ALERT - BLOOD CULTURE IDENTIFICATION (BCID)  Leslie [name] is an 72 y.o. male who presented to [facility] on 03/26/2018 with a chief complaint of hypotension and lethargy s/p partial R foot amputation, new imaging concerning for OM. S/p BKA 6/2. 5/31 BCx + for 1/2 GNR (BCID negative).  Name of physician (or Provider) Contacted: text-paged teaching service  Current antibiotics: Vancomycin and Cefepime  Changes to prescribed antibiotics recommended:  Continue vancomycin x 24hrs post-BKA per Dr. Jess Barters recommendation of continuing abx for 24hrs s/p BKA. Continue cefepime until BCx results.  Results for orders placed or performed during the hospital encounter of 03/26/18  Blood Culture ID Panel (Reflexed) (Collected: 03/26/2018  1:42 PM)  Result Value Ref Range   Enterococcus species NOT DETECTED NOT DETECTED   Listeria monocytogenes NOT DETECTED NOT DETECTED   Staphylococcus species NOT DETECTED NOT DETECTED   Staphylococcus aureus NOT DETECTED NOT DETECTED   Streptococcus species NOT DETECTED NOT DETECTED   Streptococcus agalactiae NOT DETECTED NOT DETECTED   Streptococcus pneumoniae NOT DETECTED NOT DETECTED   Streptococcus pyogenes NOT DETECTED NOT DETECTED   Acinetobacter baumannii NOT DETECTED NOT DETECTED   Enterobacteriaceae species NOT DETECTED NOT DETECTED   Enterobacter cloacae complex NOT DETECTED NOT DETECTED   Escherichia coli NOT DETECTED NOT DETECTED   Klebsiella oxytoca NOT DETECTED NOT DETECTED   Klebsiella pneumoniae NOT DETECTED NOT DETECTED   Proteus species NOT DETECTED NOT DETECTED   Serratia marcescens NOT DETECTED NOT DETECTED   Haemophilus influenzae NOT DETECTED NOT DETECTED   Neisseria meningitidis NOT DETECTED NOT DETECTED   Pseudomonas aeruginosa NOT DETECTED NOT DETECTED   Candida albicans NOT DETECTED NOT DETECTED   Candida glabrata NOT DETECTED NOT DETECTED   Candida krusei NOT DETECTED NOT DETECTED   Candida parapsilosis NOT DETECTED NOT DETECTED   Candida tropicalis NOT DETECTED NOT DETECTED    [name], PharmD, BCPS Clinical Pharmacist 03/28/2018 3:33 PM

## 2018-03-28 NOTE — Anesthesia Procedure Notes (Signed)
Anesthesia Regional Block: Popliteal block   Pre-Anesthetic Checklist: ,, timeout performed, Correct Patient, Correct Site, Correct Laterality, Correct Procedure, Correct Position, site marked, Risks and benefits discussed,  Surgical consent,  Pre-op evaluation,  At surgeon's request and post-op pain management  Laterality: Right  Prep: chloraprep       Needles:  Injection technique: Single-shot  Needle Type: Echogenic Needle     Needle Length: 9cm  Needle Gauge: 21     Additional Needles:   Procedures:,,,, ultrasound used (permanent image in chart),,,,  Narrative:  Start time: 03/28/2018 7:30 AM End time: 03/28/2018 7:38 AM Injection made incrementally with aspirations every 5 mL.  Performed by: Personally  Anesthesiologist: Suzette Battiest, MD

## 2018-03-28 NOTE — Anesthesia Procedure Notes (Signed)
Procedure Name: LMA Insertion Date/Time: 03/28/2018 7:58 AM Performed by: Jenne Campus, CRNA Pre-anesthesia Checklist: Patient identified, Emergency Drugs available, Suction available and Patient being monitored Patient Re-evaluated:Patient Re-evaluated prior to induction Oxygen Delivery Method: Circle System Utilized Preoxygenation: Pre-oxygenation with 100% oxygen Induction Type: IV induction Ventilation: Mask ventilation without difficulty LMA: LMA inserted LMA Size: 4.0 Number of attempts: 1 Placement Confirmation: positive ETCO2 and breath sounds checked- equal and bilateral Tube secured with: Tape Dental Injury: Teeth and Oropharynx as per pre-operative assessment

## 2018-03-29 ENCOUNTER — Encounter (HOSPITAL_COMMUNITY): Payer: Self-pay | Admitting: Orthopedic Surgery

## 2018-03-29 ENCOUNTER — Other Ambulatory Visit: Payer: Self-pay

## 2018-03-29 DIAGNOSIS — R7881 Bacteremia: Secondary | ICD-10-CM

## 2018-03-29 DIAGNOSIS — B9689 Other specified bacterial agents as the cause of diseases classified elsewhere: Secondary | ICD-10-CM

## 2018-03-29 DIAGNOSIS — Z89512 Acquired absence of left leg below knee: Secondary | ICD-10-CM

## 2018-03-29 DIAGNOSIS — A4150 Gram-negative sepsis, unspecified: Secondary | ICD-10-CM

## 2018-03-29 DIAGNOSIS — Z89511 Acquired absence of right leg below knee: Secondary | ICD-10-CM

## 2018-03-29 DIAGNOSIS — R5084 Febrile nonhemolytic transfusion reaction: Secondary | ICD-10-CM

## 2018-03-29 DIAGNOSIS — M86171 Other acute osteomyelitis, right ankle and foot: Secondary | ICD-10-CM

## 2018-03-29 DIAGNOSIS — E876 Hypokalemia: Secondary | ICD-10-CM

## 2018-03-29 DIAGNOSIS — I10 Essential (primary) hypertension: Secondary | ICD-10-CM

## 2018-03-29 DIAGNOSIS — E8771 Transfusion associated circulatory overload: Secondary | ICD-10-CM

## 2018-03-29 LAB — BASIC METABOLIC PANEL
ANION GAP: 8 (ref 5–15)
BUN: 17 mg/dL (ref 6–20)
CALCIUM: 7.9 mg/dL — AB (ref 8.9–10.3)
CO2: 24 mmol/L (ref 22–32)
Chloride: 106 mmol/L (ref 101–111)
Creatinine, Ser: 1.01 mg/dL (ref 0.61–1.24)
GLUCOSE: 146 mg/dL — AB (ref 65–99)
Potassium: 3.3 mmol/L — ABNORMAL LOW (ref 3.5–5.1)
SODIUM: 138 mmol/L (ref 135–145)

## 2018-03-29 LAB — GLUCOSE, CAPILLARY
GLUCOSE-CAPILLARY: 189 mg/dL — AB (ref 65–99)
GLUCOSE-CAPILLARY: 229 mg/dL — AB (ref 65–99)
GLUCOSE-CAPILLARY: 171 mg/dL — AB (ref 65–99)
Glucose-Capillary: 135 mg/dL — ABNORMAL HIGH (ref 65–99)

## 2018-03-29 MED ORDER — FUROSEMIDE 20 MG PO TABS
20.0000 mg | ORAL_TABLET | Freq: Every day | ORAL | Status: DC
Start: 2018-03-29 — End: 2018-03-30
  Administered 2018-03-29 – 2018-03-30 (×2): 20 mg via ORAL
  Filled 2018-03-29 (×2): qty 1

## 2018-03-29 MED ORDER — POLYETHYLENE GLYCOL 3350 17 G PO PACK
17.0000 g | PACK | Freq: Once | ORAL | Status: AC
  Administered 2018-03-29: 17 g via ORAL
  Filled 2018-03-29: qty 1

## 2018-03-29 MED ORDER — INSULIN GLARGINE 100 UNIT/ML ~~LOC~~ SOLN
12.0000 [IU] | Freq: Every day | SUBCUTANEOUS | Status: DC
  Administered 2018-03-29: 12 [IU] via SUBCUTANEOUS
  Filled 2018-03-29: qty 0.12

## 2018-03-29 MED ORDER — SODIUM CHLORIDE 0.9 % IV SOLN
2.0000 g | Freq: Two times a day (BID) | INTRAVENOUS | Status: DC
  Administered 2018-03-30 (×2): 2 g via INTRAVENOUS
  Filled 2018-03-29 (×5): qty 2

## 2018-03-29 MED ORDER — POTASSIUM CHLORIDE CRYS ER 20 MEQ PO TBCR
20.0000 meq | EXTENDED_RELEASE_TABLET | Freq: Two times a day (BID) | ORAL | Status: DC
  Administered 2018-03-29 – 2018-03-30 (×3): 20 meq via ORAL
  Filled 2018-03-29 (×3): qty 1

## 2018-03-29 MED ORDER — AMLODIPINE BESYLATE 10 MG PO TABS
10.0000 mg | ORAL_TABLET | Freq: Every day | ORAL | Status: DC
  Administered 2018-03-29 – 2018-03-30 (×2): 10 mg via ORAL
  Filled 2018-03-29 (×2): qty 1

## 2018-03-29 NOTE — Plan of Care (Signed)
  Problem: Education: Goal: Knowledge of General Education information will improve Outcome: Progressing   Problem: Health Behavior/Discharge Planning: Goal: Ability to manage health-related needs will improve Outcome: Progressing   

## 2018-03-29 NOTE — Progress Notes (Signed)
Internal Medicine Attending:   I saw and examined the patient. I reviewed Dr Tally Joe note and I agree with the resident's findings and plan as documented in the resident's note. BCx from 5/31 reveal GNR 2/2.  Biofire negative.  Continue Broad spectrum ABx today for total of 48 hours post OP. Follow cultures, may need continued oral antibiotics for total of 7-10 days for GNR.

## 2018-03-29 NOTE — Evaluation (Signed)
Physical Therapy Evaluation Patient Details Name: Leslie [name] MRN: [id number] DOB: [birth year] Today's Date: 03/29/2018   History of Present Illness  Pt s/p rt bka after dehiscence/nonhealing of rt transmetatarsal amputation. PMH - Lt BKA, rt toe amputations, rt transmet amputation, PVD, DM, COPD  Clinical Impression  Pt admitted with above diagnosis and presents to PT with functional limitations due to deficits listed below (See PT problem list). Pt needs skilled PT to maximize independence and safety to allow discharge to SNF for further rehab before return home. Feel pt will need the extended time available at SNF to reach more independent level.     Follow Up Recommendations SNF    Equipment Recommendations  None recommended by PT    Recommendations for Other Services OT consult     Precautions / Restrictions Precautions Precautions: Fall      Mobility  Bed Mobility Overal bed mobility: Needs Assistance Bed Mobility: Supine to Sit;Sit to Supine     Supine to sit: +2 for physical assistance;Max assist Sit to supine: +2 for physical assistance;Min assist   General bed mobility comments: Assist to bring legs off bed and to elevate trunk into sitting. Assist to lower trunk and bring legs back up into bed  Transfers                 General transfer comment: Attempted scooting EOB but pt unable without +2 total assist  Ambulation/Gait                Stairs            Wheelchair Mobility    Modified Rankin (Stroke Patients Only)       Balance Overall balance assessment: Needs assistance Sitting-balance support: Bilateral upper extremity supported;Feet unsupported Sitting balance-Leahy Scale: Poor Sitting balance - Comments: Sat EOB x 10 minutes with BUE support and close supervision                                     Pertinent Vitals/Pain Pain Assessment: Faces Faces Pain Scale: Hurts even more Pain Location: RLE Pain  Descriptors / Indicators: Grimacing;Guarding Pain Intervention(s): Limited activity within patient's tolerance;Monitored during session;Repositioned    Home Living Family/patient expects to be discharged to:: Skilled nursing facility Living Arrangements: Alone                    Prior Function Level of Independence: Needs assistance   Gait / Transfers Assistance Needed: Transfering to/from w/c modified independent           Hand Dominance        Extremity/Trunk Assessment   Upper Extremity Assessment Upper Extremity Assessment: Defer to OT evaluation    Lower Extremity Assessment Lower Extremity Assessment: RLE deficits/detail;LLE deficits/detail RLE Deficits / Details: BKA, moves against gravity LLE Deficits / Details: BKA, moves against gravity       Communication   Communication: Other (comment)(mumbling and difficult to understand at times)  Cognition Arousal/Alertness: Awake/alert Behavior During Therapy: WFL for tasks assessed/performed Overall Cognitive Status: No family/caregiver present to determine baseline cognitive functioning                                 General Comments: at times difficult to follow his thought process.       General Comments      Exercises  Assessment/Plan    PT Assessment Patient needs continued PT services  PT Problem List Decreased strength;Decreased activity tolerance;Decreased balance;Decreased mobility;Decreased knowledge of use of DME;Obesity;Pain       PT Treatment Interventions DME instruction;Functional mobility training;Therapeutic activities;Therapeutic exercise;Balance training;Patient/family education;Wheelchair mobility training    PT Goals (Current goals can be found in the Care Plan section)  Acute Rehab PT Goals Patient Stated Goal: to return home PT Goal Formulation: With patient Time For Goal Achievement: 04/12/18 Potential to Achieve Goals: Fair    Frequency Min 3X/week    Barriers to discharge Decreased caregiver support Lives alone    Co-evaluation               AM-PAC PT "6 Clicks" Daily Activity  Outcome Measure Difficulty turning over in bed (including adjusting bedclothes, sheets and blankets)?: Unable Difficulty moving from lying on back to sitting on the side of the bed? : Unable Difficulty sitting down on and standing up from a chair with arms (e.g., wheelchair, bedside commode, etc,.)?: Unable Help needed moving to and from a bed to chair (including a wheelchair)?: Total Help needed walking in hospital room?: Total Help needed climbing 3-5 steps with a railing? : Total 6 Click Score: 6    End of Session   Activity Tolerance: Patient tolerated treatment well Patient left: in bed;with call bell/phone within reach Nurse Communication: Mobility status;Need for lift equipment PT Visit Diagnosis: Other abnormalities of gait and mobility (R26.89);Muscle weakness (generalized) (M62.81);Difficulty in walking, not elsewhere classified (R26.2);Pain Pain - Right/Left: Right Pain - part of body: Leg    Time: 1319-1336 PT Time Calculation (min) (ACUTE ONLY): 17 min   Charges:   PT Evaluation $PT Eval Moderate Complexity: 1 Mod     PT G CodesMarland Kitchen        [facility] PT [place] 03/29/2018, 2:13 PM

## 2018-03-29 NOTE — Progress Notes (Signed)
   Subjective:  Mr Donahue was seen resting comfortably in his bed this morning. He has no acute complaints today. Discussed options for rehab including CIR, patient did not seem interested in CIR, but we told him we would discuss his options again after his PT evaluation.  Objective:  Vital signs in last 24 hours: Vitals:   03/29/18 0311 03/29/18 0500 03/29/18 0700 03/29/18 0759  BP: (!) 145/76   (!) 149/72  Pulse: 83   85  Resp: 18 13 15 17   Temp: 98 F (36.7 C)   97.9 F (36.6 C)  TempSrc: Oral   Oral  SpO2: 98%   98%  Weight: 200 lb 9.9 oz (91 kg)      Physical Exam  Constitutional: He appears well-developed and well-nourished. No distress.  HENT:  Head: Normocephalic and atraumatic.  Cardiovascular: Normal rate, regular rhythm, normal heart sounds and intact distal pulses.  Pulmonary/Chest: Effort normal and breath sounds normal. No respiratory distress.  Abdominal: Soft. Bowel sounds are normal.  Obese abdomen  Musculoskeletal: He exhibits no edema.  R Transmet amputaiton wound with clean bandage in place S/P L BKA No Edema noted   Neurological: He is alert.  Skin: Skin is warm and dry.   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 1/2 blood cultures are also growing GNR.   Sepsis 2/2 right foot osteomyelitis and overlying cellulitis: Underwent successful R BKA with orthopedics 6/2. > 5/31 Blood Cultures: 1/2 with GNR > Repeat Blood Cultures 6/3: Pending - Await speciation of blood culture - Continue IV Vancomycin  for 1 more day post-op then discontinue - Continue IV Cefepime, pending blood culture results - Oxycodone 5-10mg  q4h, PRN moderate pain & 10-15mg  q4h, PRN severe pain.  - PT eval and treat - SW consult for SNF placement  Hypokalemia: 3.3 on AM labs. - 20 mEq PO KCl BID x2 - AM  BMP  Acute on chronic normocytic anemia: Hgb 6.6 on admission with a baseline ~9-10. Responded appropriately to 2 units PRBs on admission. Of note, patient developed fever during transfusion of his second unit on admission ()likely 2/2 to ongoing infection at that time), followed by likely TACO (in the setting of being s/p 4.5L IVF). Hgb Stable 8.2.  AKI: Resolved. Cr elevated at 2.13 on admission with a normal baseline. Improved with IVF. Cr 1.01 today.  Acute hypoxic and hypercarbic respiratory failure: Following fluid and blood resuscitation on admission. Responded well to IV Lasix 80mg  x1 and is continuing to breath comfortably without supplemental O2.  - Resolved, Continue to monitor - PRN BiPAP  - Will give Lasix PRN if further respiratory failure developes  DM: Home regimen: Metformin 1000mg  BID, Lantus 20 units daily and Novolog SSI at home. Will work towards maintaining glycemic control to promote healing.  > CBGs 130s-180s - Increase Lantus to 12U Daily - SSI-M TID WC - Pregabalin 100mg  TID  HTN: Home medications held on admission given hypotension. Usually takes Hydralazine 50mg  TID, Lasix 20mg  daily, Amlodipine 10mg  daily, Losartan 100mg  daily, and Metoprolol 200mg  BID. > BP improving 140s/70s today - Hold Hydralazine 50mg  TID, Amlodipine 10mg  daily, Losartan 100mg  daily, and Metoprolol 200mg  BID. - Resume Lasix 20mg  daily   Dispo: Anticipated discharge in 1-2 days to facility once bed available.   Neva Seat, MD 03/29/2018, 12:21 PM Pager: [phone]

## 2018-03-29 NOTE — Progress Notes (Signed)
Pharmacy Antibiotic Note  Leslie [name] is a 72 y.o. male presenting with hypotension and lethargy s/p partial R foot amputation PTA, now s/p for R BKA on 6/2. Continues on vancomycin and cefepime. WBC down 16.6, currently AF. Scr 2.13 on admit, now down to 1.01 (BL ~1), estimated CrCl ~68 mL/min. Blood cultures growing GNRs not identified by BCID.  Plan: Increase cefepime to 2g IV q12h Continue vancomycin 750mg  IV q12h Per IM team, would like to continue vancomycin for another 24 hrs- plan to d/c tomorrow, 6/4 F/u C&S, clinical status, renal function, vancomycin trough as indicated  Weight: 200 lb 9.9 oz (91 kg)  Temp (24hrs), Avg:98.1 F (36.7 C), Min:97.8 F (36.6 C), Max:98.6 F (37 C)  Recent Labs  Lab 03/26/18 1324 03/26/18 1344 03/26/18 1553 03/27/18 0320 03/28/18 0429 03/29/18 0654  WBC 19.8*  --   --  20.2* 16.6*  --   CREATININE 2.13*  --   --  1.58* 1.42* 1.01  LATICACIDVEN  --  2.58* 1.89  --   --   --     Estimated Creatinine Clearance: 67.2 mL/min (by C-G formula based on SCr of 1.01 mg/dL).    Allergies  Allergen Reactions  . Atorvastatin Other (See Comments)    Unknown reaction  . Statins Other (See Comments)    Unknown reaction - on MAR  . Flexeril [Cyclobenzaprine] Other (See Comments)    Dry mouth and hallucinations   Antimicrobials this admission: Vanc 5/31 >>  Zosyn 5/31 x1 Cefepime 5/31 >>   Dose adjustments this admission: Empirically adjust vanc 6/1 for improving Scr (only loading dose given so far)  Microbiology results: 5/31 BCx: GNRs  Erin N. Gerarda Fraction, PharmD PGY1 Pharmacy Resident Pager: [phone] 03/29/2018 9:57 AM

## 2018-03-29 NOTE — Progress Notes (Signed)
Patient ID: Leslie [name], male   DOB: 02/17/1946, 72 y.o.   MRN: [id number] Patient is postoperative day 1 transtibial amputation Right.  There is no drainage in the wound VAC canister.  Anticipate discharge to skilled nursing.

## 2018-03-30 DIAGNOSIS — Z888 Allergy status to other drugs, medicaments and biological substances status: Secondary | ICD-10-CM

## 2018-03-30 DIAGNOSIS — D509 Iron deficiency anemia, unspecified: Secondary | ICD-10-CM

## 2018-03-30 DIAGNOSIS — D638 Anemia in other chronic diseases classified elsewhere: Secondary | ICD-10-CM

## 2018-03-30 LAB — BASIC METABOLIC PANEL
ANION GAP: 9 (ref 5–15)
BUN: 14 mg/dL (ref 6–20)
CHLORIDE: 103 mmol/L (ref 101–111)
CO2: 27 mmol/L (ref 22–32)
Calcium: 7.9 mg/dL — ABNORMAL LOW (ref 8.9–10.3)
Creatinine, Ser: 0.89 mg/dL (ref 0.61–1.24)
GFR calc Af Amer: >60 mL/min (ref >60)
Glucose, Bld: 165 mg/dL — ABNORMAL HIGH (ref 65–99)
POTASSIUM: 3.5 mmol/L (ref 3.5–5.1)
SODIUM: 139 mmol/L (ref 135–145)

## 2018-03-30 LAB — GLUCOSE, CAPILLARY
GLUCOSE-CAPILLARY: 171 mg/dL — AB (ref 65–99)
GLUCOSE-CAPILLARY: 216 mg/dL — AB (ref 65–99)
Glucose-Capillary: 243 mg/dL — ABNORMAL HIGH (ref 65–99)

## 2018-03-30 MED ORDER — OXYCODONE HCL 5 MG PO TABS: 5.0000 mg | ORAL_TABLET | ORAL | 0 refills | Status: DC | PRN

## 2018-03-30 MED ORDER — ACETAMINOPHEN 325 MG PO TABS: 650.0000 mg | ORAL_TABLET | Freq: Four times a day (QID) | ORAL | 0 refills | Status: AC | PRN

## 2018-03-30 MED ORDER — LOSARTAN POTASSIUM 50 MG PO TABS
100.0000 mg | ORAL_TABLET | Freq: Every day | ORAL | Status: DC
  Administered 2018-03-30: 100 mg via ORAL
  Filled 2018-03-30 (×2): qty 2

## 2018-03-30 MED ORDER — SENNOSIDES-DOCUSATE SODIUM 8.6-50 MG PO TABS: 1.0000 | ORAL_TABLET | Freq: Two times a day (BID) | ORAL | Status: DC

## 2018-03-30 MED ORDER — INSULIN GLARGINE 100 UNIT/ML ~~LOC~~ SOLN
15.0000 [IU] | Freq: Every day | SUBCUTANEOUS | Status: DC
  Filled 2018-03-30: qty 0.15

## 2018-03-30 MED ORDER — METOPROLOL SUCCINATE ER 100 MG PO TB24
200.0000 mg | ORAL_TABLET | Freq: Two times a day (BID) | ORAL | Status: DC
  Administered 2018-03-30: 200 mg via ORAL
  Filled 2018-03-30: qty 2

## 2018-03-30 MED ORDER — CIPROFLOXACIN HCL 750 MG PO TABS: 750.0000 mg | ORAL_TABLET | Freq: Two times a day (BID) | ORAL | 0 refills | Status: AC

## 2018-03-30 MED ORDER — OXYCODONE HCL 5 MG PO TABS
5.0000 mg | ORAL_TABLET | ORAL | Status: DC | PRN
  Administered 2018-03-30: 5 mg via ORAL
  Filled 2018-03-30: qty 2

## 2018-03-30 NOTE — Progress Notes (Signed)
Patient report given to Wynona Luna, nurse at [facility].

## 2018-03-30 NOTE — Evaluation (Signed)
Occupational Therapy Evaluation Patient Details Name: Leslie [name] MRN: [id number] DOB: [birth year] Today's Date: 03/30/2018    History of Present Illness Pt s/p right BKA after dehiscence/nonhealing of rt transmetatarsal amputation. PMH - Lt BKA, rt toe amputations, rt transmet amputation, PVD, DM, COPD   Clinical Impression   PTA, pt was living alone and was performing BADLs. Pt's aide and significant other assisting with IADLs. Pt currently performing bed mobility with Min Guard A, UB ADLs with Min A, and LB ADLs with Max A. Pt performing lateral scoots towards HOB with Max A. Pt would benefit from further acute OT to facilitate safe dc. Recommend dc to SNF for further OT to optimize safety, independence with ADLs, and return to PLOF.      Follow Up Recommendations  SNF    Equipment Recommendations  Other (comment)(Defer to next venue)    Recommendations for Other Services PT consult     Precautions / Restrictions Precautions Precautions: Fall Restrictions Weight Bearing Restrictions: Yes RLE Weight Bearing: Non weight bearing      Mobility Bed Mobility Overal bed mobility: Needs Assistance Bed Mobility: Supine to Sit;Sit to Supine     Supine to sit: Min assist Sit to supine: Min guard   General bed mobility comments: Min A for pt to steady self in upright positioning. MIn GUard A for safety, use of bed rails.   Transfers Overall transfer level: Needs assistance   Transfers: Lateral/Scoot Transfers          Lateral/Scoot Transfers: Max assist General transfer comment: Max A to facilitate hips towards HOB with bed pad.    Balance Overall balance assessment: Needs assistance Sitting-balance support: Bilateral upper extremity supported;Feet unsupported Sitting balance-Leahy Scale: Fair                                     ADL either performed or assessed with clinical judgement   ADL Overall ADL's : Needs assistance/impaired Eating/Feeding:  Set up;Sitting;Supervision/ safety   Grooming: Wash/dry face;Applying deodorant;Min guard;Sitting Grooming Details (indicate cue type and reason): Pt performing grooming tasks at EOB with MIn GUard for safety Upper Body Bathing: Minimal assistance;Sitting Upper Body Bathing Details (indicate cue type and reason): Min A for washing back Lower Body Bathing: Maximal assistance;Sitting/lateral leans;Bed level   Upper Body Dressing : Minimal assistance;Sitting   Lower Body Dressing: Maximal assistance;Bed level;Sitting/lateral leans               Functional mobility during ADLs: Maximal assistance(Lateral scoots towards HOB) General ADL Comments: Pt maintaining sitting balance at EOB with MIn GUard for safety. Performing lateral scooting to [facility] with Max A for scooting hips with bed pad.      Vision         Perception     Praxis      Pertinent Vitals/Pain Pain Assessment: Faces Faces Pain Scale: Hurts even more Pain Location: RLE Pain Descriptors / Indicators: Grimacing;Guarding Pain Intervention(s): Monitored during session;Limited activity within patient's tolerance;Repositioned;Premedicated before session     Hand Dominance Right   Extremity/Trunk Assessment Upper Extremity Assessment Upper Extremity Assessment: Overall WFL for tasks assessed   Lower Extremity Assessment Lower Extremity Assessment: Defer to PT evaluation RLE Deficits / Details: BKA, moves against gravity LLE Deficits / Details: BKA, moves against gravity   Cervical / Trunk Assessment Cervical / Trunk Assessment: Other exceptions Cervical / Trunk Exceptions: Increased body habitus   Communication Communication Communication:  Other (comment)(mumbling and difficult to understand at times)   Cognition Arousal/Alertness: Awake/alert Behavior During Therapy: WFL for tasks assessed/performed Overall Cognitive Status: No family/caregiver present to determine baseline cognitive functioning                                  General Comments: at times difficult to follow his thought process.    General Comments  VSS    Exercises     Shoulder Instructions      Home Living Family/patient expects to be discharged to:: Skilled nursing facility Living Arrangements: Alone                               Additional Comments: [facility] and then [facility]      Prior Functioning/Environment Level of Independence: Needs assistance  Gait / Transfers Assistance Needed: Transfering to/from w/c modified independent     Comments: patient living alone with support from aide and gf, reports aroudn 25 falls in last year.        OT Problem List: Decreased strength;Decreased activity tolerance;Decreased range of motion;Impaired balance (sitting and/or standing);Decreased knowledge of use of DME or AE;Decreased knowledge of precautions;Pain      OT Treatment/Interventions: Self-care/ADL training;Therapeutic exercise;Energy conservation;DME and/or AE instruction;Therapeutic activities;Patient/family education    OT Goals(Current goals can be found in the care plan section) Acute Rehab OT Goals Patient Stated Goal: to return home OT Goal Formulation: With patient Time For Goal Achievement: 04/13/18 Potential to Achieve Goals: Good ADL Goals Pt Will Perform Grooming: with set-up;with supervision;sitting Pt Will Perform Upper Body Dressing: with set-up;with supervision;sitting Pt Will Perform Lower Body Dressing: with set-up;with supervision;sitting/lateral leans;bed level Pt Will Transfer to Toilet: with set-up;with supervision;bedside commode;with transfer board Pt Will Perform Toileting - Clothing Manipulation and hygiene: with set-up;with supervision;sitting/lateral leans  OT Frequency: Min 2X/week   Barriers to D/C: Decreased caregiver support  lives alone       Co-evaluation              AM-PAC PT "6 Clicks" Daily  Activity     Outcome Measure Help from another person eating meals?: A Little Help from another person taking care of personal grooming?: A Little Help from another person toileting, which includes using toliet, bedpan, or urinal?: A Lot Help from another person bathing (including washing, rinsing, drying)?: A Lot Help from another person to put on and taking off regular upper body clothing?: A Little Help from another person to put on and taking off regular lower body clothing?: A Lot 6 Click Score: 15   End of Session Nurse Communication: Mobility status  Activity Tolerance: Patient tolerated treatment well Patient left: in bed;with call bell/phone within reach;with bed alarm set  OT Visit Diagnosis: Unsteadiness on feet (R26.81);Other abnormalities of gait and mobility (R26.89);Muscle weakness (generalized) (M62.81);Pain Pain - Right/Left: Right Pain - part of body: Leg                Time: 6378-5885 OT Time Calculation (min): 28 min Charges:  OT General Charges $OT Visit: 1 Visit OT Evaluation $OT Eval Moderate Complexity: 1 Mod OT Treatments $Self Care/Home Management : 8-22 mins G-Codes:     [place], OTR/L Acute Rehab Pager: [phone] Office: [place] 03/30/2018, 12:44 PM

## 2018-03-30 NOTE — Progress Notes (Signed)
[place] VAC applied in anticipation of discharge. Suction set to 125 mmHg and no leaks appreciated.

## 2018-03-30 NOTE — Progress Notes (Addendum)
   Subjective:  Leslie [name] was seen resting comfortably in his bed this morning. He states his pain is controlled on oral medications. He denies fevers, chills, or shortness of breath. He has not yet had a bowl movement. He states he is willing to go back to his SNF for rehab since that is what is recommended. HE has no other questions this morning.  Objective:  Vital signs in last 24 hours: Vitals:   03/29/18 2006 03/29/18 2354 03/30/18 0012 03/30/18 0413  BP: (!) 164/78 (!) 183/72 (!) 152/65 (!) 181/81  Pulse: 89 90 88 83  Resp: 16 16 15 16   Temp: 98.9 F (37.2 C)  98 F (36.7 C) 98.8 F (37.1 C)  TempSrc: Oral   Oral  SpO2: 96% 98% 95% 95%  Weight:    214 lb 8.1 oz (97.3 kg)   Physical Exam  Constitutional: He appears well-developed and well-nourished. No distress.  HENT:  Head: Normocephalic and atraumatic.  Cardiovascular: Normal rate, regular rhythm, normal heart sounds and intact distal pulses.  Pulmonary/Chest: Effort normal and breath sounds normal. No respiratory distress.  Abdominal: Soft. Bowel sounds are normal.  Obese abdomen  Musculoskeletal: He exhibits no edema.  R Transmet amputaiton wound with clean bandage in place S/P L BKA No Edema noted   Neurological: He is alert.  Skin: Skin is warm and dry.    Assessment/Plan: 72yo male who is a [facility] with PMH of PAD s/p L BKA, recent transmetatarsal amputation 02/22/2018, dia.betes, and COPD/asthma who [facility] from [facility] with sepsis secondary to right foot osteomyelitis.  Bacteremia Sepsis 2/2 right foot osteomyelitis and overlying cellulitis: Sepsis resolved, Source control with BKA. > Underwent successful R BKA with orthopedics 6/2. > 5/31 Blood Cultures: 2/2 with GNR: Not yet speciated  > Repeat Blood Cultures 6/3: Pending > PT recommends SNF - Await speciation of blood culture - Discontinue IV Vancomycin - Continue IV Cefepime, will need additional 8 days for bacteremia - Oxycodone 10mg  q4h, PRN  moderate-severe pain.  - SW consult for Return to [facility]  Hypokalemia: Improved to 3.5 on AM labs.  Acute on chronic normocytic anemia: Hgb 6.6 on admission with a baseline ~9-10. Responded appropriately to 2 units PRBs on admission. Of note, patient developed fever during transfusion of his second unit on admission likely (likely 2/2 to ongoing infection at that time), followed by likely TACO (in the setting of being s/p 4.5L IVF). Hgb Stable 8.2 (6/2).   HTN: Home medications held on admission given hypotension. Usually takes Hydralazine 50mg  TID, Lasix 20mg  daily, Amlodipine 10mg  daily, Losartan 100mg  daily, and Metoprolol 200mg  BID. > BP elevated overnight, Amlodipine restarted - Hold Hydralazine 50mg  TID - Resume Losartan 100mg  daily, and Metoprolol 200mg  BID. - Lasix 20mg  daily  - Amlodipine 10mg  daily   AKI: Resolved. Cr elevated at 2.13 on admission with a normal baseline. Improved with IVF. Cr 1.01 today.  DM: Home regimen: Metformin 1000mg  BID, Lantus 20 units daily and Novolog SSI at home. Will work towards maintaining glycemic control to promote healing.  > CBGs 160s-170s - Increase Lantus to 15U Daily - SSI-M TID WC - Pregabalin 100mg  TID  Acute hypoxic and hypercarbic respiratory failure: Resolved. Following fluid and blood resuscitation on admission. Responded well to IV Lasix 80mg  x1 and is continuing to breath comfortably without supplemental O2.  - Resolved, Continue to monitor  Dispo: Anticipated discharge today with IV antibiotics back to [facility].   Leslie Seat, MD 03/30/2018, 6:59 AM Pager: [phone]

## 2018-03-30 NOTE — Progress Notes (Signed)
Internal Medicine Attending:   I saw and examined the patient. I reviewed the resident's note and I agree with the resident's findings and plan as documented in the resident's note. Patient is feeling well, no complaints, reports ready to be discharged. Overall, Agree with plan> D/C Vancomycin, Follow cultures for GNR will need 10 day total course, given he is afebrile and doing well this could be converted to oral once culture data is avalaible. For his Acute on Chronic Anemia> this is stable, iron studies earlier revealed low TSAT and high ferritin (falsely elevated by osteo).  He has received 2 units PRBC, I suspect his anemia may be a combination of IDA and AoChronic Disease.  Would repeat iron studies in a few weeks to reassess.

## 2018-03-30 NOTE — Progress Notes (Signed)
PHARMACY CONSULT NOTE FOR:  OUTPATIENT  PARENTERAL ANTIBIOTIC THERAPY (OPAT)  Indication: Gram-negative bacteremia Regimen: Cefepime 2g IV q12h End date: 04/06/2018  IV antibiotic discharge orders are pended. To discharging provider:  please sign these orders via discharge navigator,  Select New Orders & click on the button choice - Manage This Unsigned Work.   Thank you for allowing pharmacy to be a part of this patient's care.  Mila Merry Gerarda Fraction, PharmD PGY1 Pharmacy Resident Pager: [phone] 03/30/2018, 2:26 PM

## 2018-03-30 NOTE — Clinical Social Work Note (Signed)
Clinical Social Work Assessment  Patient Details  Name: Leslie [name] MRN: [id number] Date of Birth: August 13, 1946  Date of referral:  03/30/18               Reason for consult:  Discharge Planning                Permission sought to share information with:  Chartered certified accountant granted to share information::  Yes, Verbal Permission Granted  Name::        Agency::  [facility]  Relationship::     Contact Information:     Housing/Transportation Living arrangements for the past 2 months:  [place] of Information:  Patient, Medical Team, Facility, Other (Comment Required)([facility]) Patient Interpreter Needed:  None Criminal Activity/Legal Involvement Pertinent to Current Situation/Hospitalization:  No - Comment as needed Significant Relationships:  Friend Lives with:  Self Do you feel safe going back to the place where you live?  Yes Need for family participation in patient care:  Yes (Comment)  Care giving concerns:  Patient is a short-term rehab resident at [facility].   Social Worker assessment / plan:  CSW met with patient. No supports at bedside. CSW introduced role and explained that PT recommendations would be discussed. Patient confirmed he was admitted from [facility] and plans to return there at discharge for more rehab. [facility] is aware. No further concerns. CSW encouraged patient to contact CSW as needed. CSW will continue to follow patient for support and facilitate discharge back to SNF once medically stable.  Employment status:  Retired Forensic scientist:  Other (Comment Required)([facility]) PT Recommendations:  [place] / Referral to community resources:  [place]  Patient/Family's Response to care:  Patient agreeable to return to SNF. Patient's friend supportive and involved in patient's care. Patient appreciated social work intervention.  Patient/Family's Understanding of and  Emotional Response to Diagnosis, Current Treatment, and Prognosis:  Patient has a good understanding of the reason for admission and plan to return to SNF at discharge. Patient appears happy with hospital care.  Emotional Assessment Appearance:  Appears stated age Attitude/Demeanor/Rapport:  Engaged, Gracious Affect (typically observed):  Accepting, Appropriate, Calm, Pleasant Orientation:  Oriented to Self, Oriented to Place, Oriented to  Time, Oriented to Situation Alcohol / Substance use:  Alcohol Use, Tobacco Use Psych involvement (Current and /or in the community):  No (Comment)  Discharge Needs  Concerns to be addressed:  Care Coordination Readmission within the last 30 days:  Yes Current discharge risk:  Dependent with Mobility, Lives alone Barriers to Discharge:  Continued Medical Work up   Candie Chroman, LCSW 03/30/2018, 1:20 PM

## 2018-03-30 NOTE — Telephone Encounter (Signed)
error 

## 2018-03-30 NOTE — NC FL2 (Signed)
[place] LEVEL OF CARE SCREENING TOOL     IDENTIFICATION  Patient Name: Leslie [name] Birthdate: 16-Nov-1945 Sex: male Admission Date (Current Location): 03/26/2018  [facility] and [place] Number:  Herbalist and Address:  [facility] [place], [place] [address] [place], [place] [address]      Provider Number: [id number]  Attending Physician Name and Address:  Lucious Groves, DO  Relative Name and Phone Number:       Current Level of Care: Hospital Recommended Level of Care: [place] Prior Approval Number:    Date Approved/Denied:   PASRR Number: [id number]  Discharge Plan: SNF    Current Diagnoses: Patient Active Problem List   Diagnosis Date Noted  . Bacteremia   . Acute respiratory distress   . AKI (acute kidney injury) ([place])   . Sepsis ([place])   . Symptomatic anemia   . Severe protein-calorie malnutrition ([place])   . Wound infection   . Dehiscence of amputation stump ([place])   . Osteomyelitis ([place]) 03/26/2018  . Cellulitis of foot without toes, right 03/03/2018  . History of transmetatarsal amputation of right foot ([place]) 02/19/2018  . Amputated toe of right foot ([place]) 01/22/2018  . Gangrene of right foot ([place])   . 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 Below knee amputation status ([place]) 04/11/2016  . History of tobacco abuse 03/31/2016  . Osteomyelitis of left foot ([place])   . 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 Septic arthritis of left foot ([place]) 03/27/2016  . Solitary pulmonary nodule 08/14/2015  . Precordial chest pain 08/13/2015  . Peripheral arterial disease ([place]) 04/18/2015  . 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 Right anterior knee pain 11/19/2012  . Toenail deformity 01/16/2012  . Gout   . Diabetic neuropathy ([name]) 03/31/2008  . ALCOHOLISM  03/31/2008  . Rosacea 03/31/2008  . GERD 01/31/2008  . COPD (chronic obstructive pulmonary disease) ([place]) 08/03/2007  . DM W/COMPLICATION NOS, TYPE II 05/08/2007  . OBESITY 05/08/2007  . HTN (hypertension) 05/08/2007  . 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 HISTORY OF ASBESTOS EXPOSURE 05/08/2007  . Chronic lower back pain on narcotics  06/16/2005  . HLD (hyperlipidemia) 03/22/2002    Orientation RESPIRATION BLADDER Height & Weight     Self, Time, Situation, Place  Normal Incontinent, External catheter Weight: 214 lb 8.1 oz (97.3 kg) Height:     BEHAVIORAL SYMPTOMS/MOOD NEUROLOGICAL BOWEL NUTRITION STATUS  (None) (None) Continent Diet(Carb modified)  AMBULATORY STATUS COMMUNICATION OF NEEDS Skin   Extensive Assist Verbally Bruising, Other (Comment), Surgical wounds, Wound Vac(Amputation, MASD. Prevena wound vac placed 6/2. Will not be removed for one week.)                       Personal Care Assistance Level of Assistance  Bathing, Feeding, Dressing Bathing Assistance: Maximum assistance Feeding assistance: Limited assistance Dressing Assistance: Maximum assistance     Functional Limitations Info  Sight, Hearing, Speech Sight Info: Adequate Hearing Info: Adequate Speech Info: Adequate    SPECIAL CARE FACTORS FREQUENCY  PT (By licensed PT), OT (By licensed OT)     PT Frequency: 5 x week OT Frequency: 5 x week            Contractures Contractures Info: Not present    Additional Factors Info  Code Status, Allergies Code Status Info: Full  Allergies Info: Atorvastatin, Statins, Flexeril (Cyclobenzaprine).           Current Medications (03/30/2018):  This is the current hospital active medication list Current Facility-Administered Medications  Medication Dose Route Frequency Provider Last Rate Last Dose  . 0.9 %  sodium chloride infusion  10 mL/hr Intravenous Once Newt Minion, MD   Stopped at 03/26/18 1550  . 0.9 %  sodium chloride infusion   Intravenous  Continuous Newt Minion, MD 10 mL/hr at 03/28/18 1843    . acetaminophen (TYLENOL) tablet 650 mg  650 mg Oral Q6H PRN Newt Minion, MD   650 mg at 03/27/18 2028   Or  . acetaminophen (TYLENOL) suppository 650 mg  650 mg Rectal Q6H PRN Newt Minion, MD   650 mg at 03/26/18 2025  . albuterol (PROVENTIL) (2.5 MG/3ML) 0.083% nebulizer solution 2.5 mg  2.5 mg Nebulization Q6H PRN Newt Minion, MD      . allopurinol (ZYLOPRIM) tablet 100 mg  100 mg Oral Daily Newt Minion, MD   100 mg at 03/30/18 1302  . alum & mag hydroxide-simeth (MAALOX/MYLANTA) 200-200-20 MG/5ML suspension 30 mL  30 mL Oral Q4H PRN Newt Minion, MD   30 mL at 03/27/18 2312  . amLODipine (NORVASC) tablet 10 mg  10 mg Oral Daily Ledell Noss, MD   10 mg at 03/30/18 1302  . antiseptic oral rinse (BIOTENE) solution 10 mL  10 mL Mouth Rinse Daily Newt Minion, MD   10 mL at 03/29/18 1345  . bisacodyl (DULCOLAX) suppository 10 mg  10 mg Rectal Daily PRN Newt Minion, MD      . carbamazepine (TEGRETOL) tablet 200 mg  200 mg Oral Daily Newt Minion, MD   200 mg at 03/30/18 1301  . carbamazepine (TEGRETOL) tablet 300 mg  300 mg Oral Q2000 Newt Minion, MD   300 mg at 03/29/18 2155  . ceFEPIme (MAXIPIME) 2 g in sodium chloride 0.9 % 100 mL IVPB  2 g Intravenous Q12H Hoffman, Erik C, DO 200 mL/hr at 03/30/18 1259 2 g at 03/30/18 1259  . clonazePAM (KLONOPIN) tablet 0.5 mg  0.5 mg Oral QHS PRN Newt Minion, MD   0.5 mg at 03/27/18 2313  . DULoxetine (CYMBALTA) DR capsule 60 mg  60 mg Oral Daily Newt Minion, MD   60 mg at 03/30/18 1301  . famotidine (PEPCID) tablet 20 mg  20 mg Oral QHS Newt Minion, MD   20 mg at 03/29/18 2155  . furosemide (LASIX) tablet 20 mg  20 mg Oral Daily Neva Seat, MD   20 mg at 03/30/18 1302  . insulin aspart (novoLOG) injection 0-15 Units  0-15 Units Subcutaneous TID WC Newt Minion, MD   5 Units at 03/30/18 1259  . insulin glargine (LANTUS) injection 15 Units  15 Units Subcutaneous  QHS Neva Seat, MD      . losartan (COZAAR) tablet 100 mg  100 mg Oral Daily Neva Seat, MD   100 mg at 03/30/18 1301  . magnesium citrate solution 1 Bottle  1 Bottle Oral Once PRN Newt Minion, MD      . methocarbamol (ROBAXIN) tablet 500 mg  500 mg Oral Q6H PRN Newt Minion, MD   500 mg at 03/30/18 0438   Or  . methocarbamol (ROBAXIN) 500 mg in dextrose 5 % 50 mL IVPB  500 mg Intravenous Q6H PRN Newt Minion, MD      .  metoCLOPramide (REGLAN) tablet 5-10 mg  5-10 mg Oral Q8H PRN Newt Minion, MD       Or  . metoCLOPramide (REGLAN) injection 5-10 mg  5-10 mg Intravenous Q8H PRN Newt Minion, MD      . metoprolol succinate (TOPROL-XL) 24 hr tablet 200 mg  200 mg Oral BID Neva Seat, MD   200 mg at 03/30/18 1302  . ondansetron (ZOFRAN) tablet 4 mg  4 mg Oral Q6H PRN Newt Minion, MD       Or  . ondansetron [facility]) injection 4 mg  4 mg Intravenous Q6H PRN Newt Minion, MD      . oxyCODONE (Oxy IR/ROXICODONE) immediate release tablet 5-10 mg  5-10 mg Oral Q4H PRN Neva Seat, MD      . pentoxifylline (TRENTAL) CR tablet 400 mg  400 mg Oral TID WC Newt Minion, MD   400 mg at 03/30/18 1300  . polyethylene glycol (MIRALAX / GLYCOLAX) packet 17 g  17 g Oral Daily PRN Newt Minion, MD      . potassium chloride SA (K-DUR,KLOR-CON) CR tablet 20 mEq  20 mEq Oral BID Neva Seat, MD   20 mEq at 03/30/18 1302  . pregabalin (LYRICA) capsule 100 mg  100 mg Oral TID Newt Minion, MD   100 mg at 03/30/18 0902  . senna-docusate (Senokot-S) tablet 1 tablet  1 tablet Oral BID Neva Seat, MD         Discharge Medications: Please see discharge summary for a list of discharge medications.  Relevant Imaging Results:  Relevant Lab Results:   Additional Information SS#: [id number]  Candie Chroman, LCSW

## 2018-03-30 NOTE — Discharge Summary (Signed)
Name: Leslie [name] MRN: [id number] DOB: 16-Sep-1946 72 y.o. PCP: Leslie Adie, MD  Date of Admission: 03/26/2018 12:55 PM Date of Discharge: 03/30/2018 Attending Physician: Leslie Groves, DO  Discharge Diagnosis: 1. Osteomyelitis 2. Sepsis 3. GNR Bacteremia 4. Symptomatic Anemia 5. Type 2 diabetes 6. HTN  Active Problems:   Osteomyelitis ([place])   Acute respiratory distress   AKI (acute kidney injury) ([place])   Sepsis (HCC)   Symptomatic anemia   Severe protein-calorie malnutrition (HCC)   Wound infection   Dehiscence of amputation stump ([place])   Bacteremia  Discharge Medications: Allergies as of 03/30/2018      Reactions   Atorvastatin Other (See Comments)   Unknown reaction   Statins Other (See Comments)   Unknown reaction - on MAR   Flexeril [cyclobenzaprine] Other (See Comments)   Dry mouth and hallucinations      Medication List    TAKE these medications   acetaminophen 325 MG tablet Commonly known as:  TYLENOL Take 2 tablets (650 mg total) by mouth every 6 (six) hours as needed for mild pain (or Fever >/= 101).   albuterol 108 (90 Base) MCG/ACT inhaler Commonly known as:  PROVENTIL HFA;VENTOLIN HFA Inhale 2 puffs into the lungs 4 (four) times daily as needed for wheezing or shortness of breath.   albuterol (2.5 MG/3ML) 0.083% nebulizer solution Commonly known as:  PROVENTIL Take 2.5 mg by nebulization every 6 (six) hours as needed for wheezing or shortness of breath.   allopurinol 100 MG tablet Commonly known as:  ZYLOPRIM Take 100 mg by mouth daily.   amLODipine 10 MG tablet Commonly known as:  NORVASC Take 1 tablet (10 mg total) by mouth daily.   antiseptic oral rinse Liqd 10 mLs by Mouth Rinse route daily.   aspirin 81 MG chewable tablet Chew 81 mg by mouth daily.   BIOFREEZE 4 % Gel Generic drug:  Menthol (Topical Analgesic) Apply 1 application topically 2 (two) times daily.   bisacodyl 10 MG suppository Commonly known as:   DULCOLAX Place 10 mg rectally once as needed (for constipation not relieved by Milk of Magnesia).   carbamazepine 200 MG tablet Commonly known as:  TEGRETOL Take 200-300 mg by mouth See admin instructions. Take 1 tablet (200 mg) by mouth every morning and 1 1/2 tablet (300 mg) every evening   CERTAVITE SENIOR/ANTIOXIDANT Tabs Take 1 tablet by mouth daily.   ciprofloxacin 750 MG tablet Commonly known as:  CIPRO Take 1 tablet (750 mg total) by mouth 2 (two) times daily for 12 days.   clonazePAM 1 MG tablet Commonly known as:  KLONOPIN Take 1 mg by mouth at bedtime.   DULoxetine 60 MG capsule Commonly known as:  CYMBALTA Take 60 mg by mouth daily.   FLEET ENEMA RE Place 1 each rectally daily as needed (constipation not relieved by Bisacodyl suppository).   furosemide 20 MG tablet Commonly known as:  LASIX Take 20 mg by mouth daily.   GERI-TUSSIN 100 MG/5ML syrup Generic drug:  guaifenesin Take 100 mg by mouth every 4 (four) hours as needed for cough.   hydrALAZINE 50 MG tablet Commonly known as:  APRESOLINE Take 50 mg by mouth 3 (three) times daily.   HYDROcodone-acetaminophen 10-325 MG tablet Commonly known as:  NORCO Take 1 tablet by mouth 3 (three) times daily.   insulin glargine 100 UNIT/ML injection Commonly known as:  LANTUS Inject 20 Units into the skin daily.   losartan 100 MG tablet Commonly known as:  COZAAR Take 100 mg by mouth daily.   metFORMIN 1000 MG tablet Commonly known as:  GLUCOPHAGE Take 1 tablet (1,000 mg total) by mouth 2 (two) times daily with a meal.   metoprolol succinate 100 MG 24 hr tablet Commonly known as:  TOPROL-XL Take 200 mg by mouth 2 (two) times daily. Take with or immediately following a meal.   MILK OF MAGNESIA PO Take 30 mLs by mouth daily as needed (if no BM in 3 days).   MINERIN Crea Apply 1 application topically 2 (two) times daily. For dryness   mirtazapine 7.5 MG tablet Commonly known as:  REMERON Take 7.5 mg by  mouth at bedtime.   nitroGLYCERIN 0.4 MG SL tablet Commonly known as:  NITROSTAT Place 0.4 mg under the tongue every 5 (five) minutes x 3 doses as needed for chest pain.   nystatin powder Generic drug:  nystatin Apply topically 3 (three) times daily. For jock itch   oxyCODONE 5 MG immediate release tablet Commonly known as:  Oxy IR/ROXICODONE Take 1-2 tablets (5-10 mg total) by mouth every 4 (four) hours as needed for moderate pain or severe pain.   pentoxifylline 400 MG CR tablet Commonly known as:  TRENTAL Take 1 tablet (400 mg total) by mouth 3 (three) times daily with meals.   pregabalin 150 MG capsule Commonly known as:  LYRICA Take 150 mg by mouth 3 (three) times daily.   ranitidine 300 MG tablet Commonly known as:  ZANTAC Take 300 mg by mouth at bedtime.   SENEXON-S 8.6-50 MG tablet Generic drug:  senna-docusate Take 2 tablets by mouth 2 (two) times daily.   Vitamin D-3 1000 units Caps Take 1,000 Units by mouth daily.       Disposition and follow-up:   Leslie [name] was discharged from [facility] in stable condition.  At the hospital follow up visit please address:  1.  R. BKA: Ensure appropriate follow-up with orthopedics and appropriate healing of surgical site. Ensure wound-care is being appropriately performed to reduce risk of infection. He will need to continue working with PT/OT to improve mobility and strength.   GNR Bacteremia: Ensure completion of prescribed antibiotic course.   HTN: Home medications held early in admission resumed over the course of his stay with hydralazine resumed at discharge. Evaluate BP control and consider altering regimen as needed.   2.  Labs / imaging needed at time of follow-up: BMET  3.  Pending labs/ test needing follow-up: Final blood cultures.   Follow-up Appointments:  Contact information for follow-up providers    Leslie Minion, MD In 1 week.   Specialty:  Orthopedic Surgery Contact  information: [place] [address] [phone]            Contact information for after-discharge care    Destination    [facility] .   Service:  Skilled Nursing Contact information: [address] [place] [facility] Course by problem list: Active Problems:   Osteomyelitis ([place])   Acute respiratory distress   AKI (acute kidney injury) ([place])   Sepsis ([place])   Symptomatic anemia   Severe protein-calorie malnutrition (HCC)   Wound infection   Dehiscence of amputation stump (HCC)   Bacteremia   Right Foot Osteomyelitis, Dehiscence of Prior Amputation Stump Mr. Maloof presented 5/31 from [facility] for evaluation of  hypotension, lethargy and discharge from his right transmetatarsal stump. Initially underwent R-transmetatarsal amputation 4/29 and completed several courses of antibiotics for cellulitis and/or wound dehiscence since then. Due to failure of attempt at limb salvage, he was already scheduled for outpatient amputation 6/5 although given the acuity of his presentation, he underwent R BKA 6/2 without acute complication. Pain controlled with PO regimen throughout hospitalization. Wound vac was placed by orthopedics and he was discharged back to SNF to continue rehabilitation.   Sepsis, Gram Negative Rod Bacteremia Met sepsis criteria on admission with infection, leukocytosis at 20,000, elevated lactic acid, hypotension and fever. He was started on antibiotics and aggressive IVF per sepsis protocol on admission with IV Vancomycin and Zosyn (Zosyn narrowed to Cefepime shortly after). Blood cultures on admission with 2/2 bottles GNR which did not speciate on BCID after 5-days. Repeat blood cultures obtained 6/3. Vancomycin was discontinued, and he was transitioned to Ciprofloxacin 713m BID for GNR bacteremia for a total of 14-days (after source control).   Symptomatic  Anemia, Chronic Anemia (Anemia of Chronic Disease and Iron Deficiency) Hemoglobin 6.6 on admission (baseline 8-9). Received 2 units pRBCs with appropriate response. His hemoglobin remained stable around 8.2 for the remainder of admission. Etiology thought to be related to his infection and systemic inflammatory response. He will require repeat CBC and repeat iron studies in several weeks to reassess and replace as indicated.   Acute Respiratory Failure with Hypoxia Patient developed respiratory distress briefly requiring BiPAP which responded to IV Lasix [id number]. This was felt to be related to IVF resuscitation for management of sepsis. He was breathing comfortably and saturating well on RA throughout the remainder of his admission.   HTN Initially, home medications were held due to hypotension. When blood pressure improved, he was restarted on Amlodipine 10 mg daily, Losartan 10068maily and Metoprolol 200m75mice daily. Lasix 20mg68mly was continued as well. His home Hydralazine 50mg 29mwas resumed on discharge.  Type 2 Diabetes Patient will need strict glycemic control to promote healing of his recent amputation site. He was discharged on his home Lantus 20 units QHS and home Metformin 1000 mg twice daily.  Acute Kidney Injury Related to sepsis; resolved with treatment of underlying condition.   Discharge Vitals:   BP (!) 147/89 (BP Location: Left Arm)   Pulse 88   Temp 98.1 F (36.7 C) (Oral)   Resp 15   Wt 214 lb 8.1 oz (97.3 kg)   SpO2 96%   BMI 36.82 kg/m   Pertinent Labs, Studies, and Procedures:  Blood cultures 03/26/18: 2/2 growing gram negative rods; not identified on BCID and has yet to speciate at 5-days Blood cultures 03/29/18: NGTD R. BKA 03/28/18  Signed: MelvinNeva Seat/01/2018, 3:30 PM   Pager: [phone]

## 2018-03-30 NOTE — Care Management Note (Signed)
Case Management Note Marvetta Gibbons RN, BSN Unit 4E-Case Manager [phone]  Patient Details  Name: MARQUAVIUS SCAIFE MRN: [id number] Date of Birth: 09-30-1946  Subjective/Objective:  Pt admitted with osteomyelitis s/p BKA on 6/2               Action/Plan: PTA pt from [facility], active with [facility]- CSW following for return to [facility].  Pt has wound VAC- per Dr. Sharol Given op note Prevena drsg placed- will have bedside RN change to [place] on discharge.   Expected Discharge Date:  03/30/18               Expected Discharge Plan:  [place]  In-House Referral:  Clinical Social Work  Discharge planning Services  CM Consult  Post Acute Care Choice:  Durable Medical Equipment Choice offered to:     DME Arranged:    DME Agency:     HH Arranged:    [place] Agency:     Status of Service:  Completed, signed off  If discussed at [employer], dates discussed:    Discharge Disposition: skilled facility   Additional Comments:  Dawayne Patricia, RN 03/30/2018, 3:40 PM

## 2018-03-30 NOTE — Plan of Care (Signed)

## 2018-03-30 NOTE — Clinical Social Work Note (Signed)
CSW facilitated patient discharge including contacting patient family and facility to confirm patient discharge plans. Clinical information faxed to facility and family agreeable with plan. CSW arranged ambulance transport via [facility] to [facility] at 5:30 pm. RN to call report prior to discharge [phone] Room 112).  CSW will sign off for now as social work intervention is no longer needed. Please consult [place] again if new needs arise.  Dayton Scrape, [place]

## 2018-03-30 NOTE — Care Management Important Message (Signed)
Important Message  Patient Details  Name: Leslie [name] MRN: [id number] Date of Birth: Sep 28, 1946   Medicare Important Message Given:  Yes    [name] 03/30/2018, 3:10 PM

## 2018-03-31 ENCOUNTER — Telehealth: Payer: Self-pay | Admitting: Internal Medicine

## 2018-03-31 LAB — CULTURE, BLOOD (ROUTINE X 2)
SPECIAL REQUESTS: ADEQUATE
Special Requests: ADEQUATE

## 2018-03-31 NOTE — Telephone Encounter (Signed)
[facility] [phone]) to inform them of patient's updated cultures and subsequent medication change. Ciprofloxacin discontinued. Patient to started Metronidazole 500mg  3 times daily for 7 days for Bacteroides Thetaiotaomicron bacteremia. Updated results also faxed to facility at [phone].  Pearson Grippe, DO IM PGY-1

## 2018-03-31 NOTE — Progress Notes (Signed)
@  approx. 2035 pt discharged into care of [facility] to be transported back to [facility]. All pt and transports questions answered.

## 2018-04-03 LAB — CULTURE, BLOOD (ROUTINE X 2)
CULTURE: NO GROWTH
CULTURE: NO GROWTH
SPECIAL REQUESTS: ADEQUATE
Special Requests: ADEQUATE

## 2018-04-08 ENCOUNTER — Encounter (INDEPENDENT_AMBULATORY_CARE_PROVIDER_SITE_OTHER): Payer: Self-pay | Admitting: Orthopedic Surgery

## 2018-04-08 ENCOUNTER — Ambulatory Visit (INDEPENDENT_AMBULATORY_CARE_PROVIDER_SITE_OTHER): Payer: Medicaid Other | Admitting: Orthopedic Surgery

## 2018-04-08 VITALS — Ht 64.0 in | Wt 214.0 lb

## 2018-04-08 DIAGNOSIS — Z89511 Acquired absence of right leg below knee: Secondary | ICD-10-CM

## 2018-04-08 DIAGNOSIS — Z89512 Acquired absence of left leg below knee: Secondary | ICD-10-CM

## 2018-04-08 DIAGNOSIS — E43 Unspecified severe protein-calorie malnutrition: Secondary | ICD-10-CM

## 2018-04-08 NOTE — Progress Notes (Signed)
Office Visit Note   Patient: Leslie [name]           Date of Birth: 22-Sep-1946           MRN: [id number] Visit Date: 04/08/2018              Requested by: [name], MD [address] [place], [place] [address] PCP: [name], MD  Chief Complaint  Patient presents with  . Right Leg - Routine Post Op    03/28/18 right BKA      HPI: Patient is a 72 year old gentleman who presents 11 days status post right transtibial amputation.  Patient states the wound VAC stopped working about 3 days ago.  Patient is currently a resident at [facility].  Assessment & Plan: Visit Diagnoses:  1. S/P bilateral BKA (below knee amputation) ([place])     Plan: Patient was given a prescription for biotech to get a stump shrinker for the right transtibial amputation he is to wear this around the clock he is to work on knee extension he is to work with physical therapy for strengthening and gait training.  Remove staples at follow-up in 2 weeks.  Protein supplement twice daily.  Follow-Up Instructions: Return in about 2 weeks (around 04/22/2018).   Ortho Exam  Patient is alert, oriented, no adenopathy, well-dressed, normal affect, normal respiratory effort. Examination patient has full active extension of his right knee the incision is well approximated there is no redness no cellulitis no drainage no signs of infection.  Patient does have severe protein caloric malnutrition and will need nutrition supplements.  Imaging: No results found. No images are attached to the encounter.  Labs: Lab Results  Component Value Date   HGBA1C 7.1 (H) 02/19/2018   HGBA1C 7.2 (H) 03/27/2016   HGBA1C 6.9 11/19/2012   ESRSEDRATE 90 (H) 03/27/2016   CRP 23.4 (H) 03/27/2016   LABURIC 4.8 08/09/2012   REPTSTATUS 04/03/2018 FINAL 03/29/2018   CULT  03/29/2018    NO GROWTH 5 DAYS Performed at [facility], [place] [address], [place], [place] [address]      Lab Results    Component Value Date   ALBUMIN 1.9 (L) 03/27/2018   ALBUMIN 2.1 (L) 03/26/2018   ALBUMIN 2.9 (L) 03/03/2018   PREALBUMIN 13.8 (L) 03/27/2016   LABURIC 4.8 08/09/2012    Body mass index is 36.73 kg/m.  Orders:  No orders of the defined types were placed in this encounter.  No orders of the defined types were placed in this encounter.    Procedures: No procedures performed  Clinical Data: No additional findings.  ROS:  All other systems negative, except as noted in the HPI. Review of Systems  Objective: Vital Signs: Ht 5\' 4"  (1.626 m)   Wt 214 lb (97.1 kg)   BMI 36.73 kg/m   Specialty Comments:  No specialty comments available.  PMFS History: Patient Active Problem List   Diagnosis Date Noted  . Bacteremia   . Acute respiratory distress   . AKI (acute kidney injury) ([place])   . Sepsis ([place])   . Symptomatic anemia   . Severe protein-calorie malnutrition ([place])   . Wound infection   . Dehiscence of amputation stump ([place])   . Osteomyelitis ([place]) 03/26/2018  . Cellulitis of foot without toes, right 03/03/2018  . History of transmetatarsal amputation of right foot ([place]) 02/19/2018  . Amputated toe of right foot ([place]) 01/22/2018  . Gangrene of right foot (  [place])   . 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 S/P bilateral BKA (below knee amputation) ([place]) 04/11/2016  . History of tobacco abuse 03/31/2016  . Osteomyelitis of left foot ([place])   . Diabetic osteomyelitis ([place]) 03/27/2016  . Septic arthritis of left foot ([place]) 03/27/2016  . Solitary pulmonary nodule 08/14/2015  . Precordial chest pain 08/13/2015  . Peripheral arterial disease ([place]) 04/18/2015  . 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 Right anterior knee pain 11/19/2012  . Toenail deformity 01/16/2012  . Gout   . Diabetic neuropathy ([place]) 03/31/2008  .  ALCOHOLISM 03/31/2008  . Rosacea 03/31/2008  . GERD 01/31/2008  . COPD (chronic obstructive pulmonary disease) ([place]) 08/03/2007  . DM W/COMPLICATION NOS, TYPE II 05/08/2007  . OBESITY 05/08/2007  . HTN (hypertension) 05/08/2007  . 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 HISTORY OF ASBESTOS EXPOSURE 05/08/2007  . Chronic lower back pain on narcotics  06/16/2005  . HLD (hyperlipidemia) 03/22/2002   Past Medical History:  Diagnosis Date  . Anxiety    Panic attack - 10  years aago- ? panic   . Asthma   . Cellulitis of left foot 2016   Healed and recurred 01/2016  . Cellulitis of right foot 06/30/2012  . Chronic lower back pain 07/01/2012  . COPD (chronic obstructive pulmonary disease) (HCC)    Uses nebulizers at home. Former smoker, current chewing tobacco. 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  Marland Kitchen GERD (gastroesophageal reflux disease)   . Gouty arthritis 07/01/2012   Not attacks in ~5 yrs (03/2016)  . Hepatitis 1967   "in jail when I got it; dr said it was pretty bad; quaranteened X 30d"  . Hyperlipidemia   . Hypertension   . Lung nodule, solitary 07/2015   Due for 1-yr recheck in 07/2017  . Peripheral arterial disease ([place])    critical limb ischemia  . Peripheral neuropathy 07/01/2012  . Seasonal allergies   . Shortness of breath dyspnea   . Skin growth 07/01/2012   right side of nares; "been on there 20 years"  . Substance abuse ([place])   . Type II diabetes mellitus ([place]) 2002   Diagnosed in 2002    Family History  Problem Relation Age of Onset  . Diabetes Sister   . Hypertension Mother     Past Surgical History:  Procedure Laterality Date  . AMPUTATION Left 03/28/2016   Procedure: AMPUTATION left foot 4th and 5 th ray;  Surgeon: Newt Minion, MD;  Location: [facility];  Service: Orthopedics;  Laterality: Left;  . AMPUTATION Left 04/11/2016   Procedure: LEFT BELOW KNEE AMPUTATION;  Surgeon: Newt Minion, MD;  Location: [facility];  Service: Orthopedics;  Laterality: Left;  . AMPUTATION Right 01/22/2018   Procedure:  RIGHT FOOT 3RD RAY AMPUTATION;  Surgeon: Newt Minion, MD;  Location: [facility];  Service: Orthopedics;  Laterality: Right;  . AMPUTATION Right 02/19/2018   Procedure: RIGHT TRANSMETATARSAL AMPUTATION;  Surgeon: Newt Minion, MD;  Location: [facility];  Service: Orthopedics;  Laterality: Right;  . AMPUTATION Right 03/28/2018   Procedure: RIGHT BELOW KNEE AMPUTATION;  Surgeon: Newt Minion, MD;  Location: [facility];  Service: Orthopedics;  Laterality: Right;  . LEG AMPUTATION BELOW KNEE Left 04/11/2016  . LOWER EXTREMITY ANGIOGRAM N/A 04/18/2013   Procedure: LOWER EXTREMITY ANGIOGRAM;  Surgeon: Sherren Mocha, MD;  Location[facility] C[facility] LAB;  Service: Cardiovascular;  Laterality: N/A;  .  LOWER EXTREMITY ANGIOGRAM  08/13/2015   bilateral iliac   . PERIPHERAL VASCULAR CATHETERIZATION Bilateral 08/13/2015   Procedure: Lower Extremity Angiography;  Surgeon: Lorretta Harp, MD;  Location: [facility] LAB;  Service: Cardiovascular;  Laterality: Bilateral;  . PERIPHERAL VASCULAR CATHETERIZATION N/A 08/13/2015   Procedure: Abdominal Aortogram;  Surgeon: Lorretta Harp, MD;  Location: [facility] LAB;  Service: Cardiovascular;  Laterality: N/A;  . TOE AMPUTATION Right 01/22/2018   3RD TOE RIGHT FOOT   Social History   Occupational History  . Occupation: disabled  Tobacco Use  . Smoking status: Former Smoker    Packs/day: 2.00    Years: 40.00    Pack years: 80.00    Types: Cigarettes    Last attempt to quit: 01/13/2002    Years since quitting: 16.2  . Smokeless tobacco: Current User    Types: Chew  . Tobacco comment: chews a little tobacco  Substance and Sexual Activity  . Alcohol use: Not Currently    Comment: 03/03/18: "Used to drink alot. No alcohol in a long time."  . Drug use: No    Types: Marijuana    Comment: 03/27/2016 "last marijuana was maybe in 2013"  . Sexual activity: Never

## 2018-04-23 ENCOUNTER — Ambulatory Visit: Payer: Medicare (Managed Care) | Admitting: Cardiovascular Disease

## 2018-04-26 ENCOUNTER — Ambulatory Visit (INDEPENDENT_AMBULATORY_CARE_PROVIDER_SITE_OTHER): Payer: Medicare (Managed Care) | Admitting: Orthopedic Surgery

## 2018-04-26 ENCOUNTER — Encounter (INDEPENDENT_AMBULATORY_CARE_PROVIDER_SITE_OTHER): Payer: Self-pay | Admitting: Orthopedic Surgery

## 2018-04-26 VITALS — Ht 64.0 in | Wt 214.0 lb

## 2018-04-26 DIAGNOSIS — Z89511 Acquired absence of right leg below knee: Secondary | ICD-10-CM

## 2018-04-26 DIAGNOSIS — Z89512 Acquired absence of left leg below knee: Secondary | ICD-10-CM

## 2018-04-26 NOTE — Progress Notes (Signed)
Office Visit Note   Patient: Leslie [name]           Date of Birth: Nov 28, 1945           MRN: [id number] Visit Date: 04/26/2018              Requested by: [name], MD [address] [place], [place] [address] PCP: [name], MD  Chief Complaint  Patient presents with  . Right Leg - Routine Post Op    03/28/18 right BKA       HPI: Patient is a 72 year old gentleman who presents 4 weeks status post right transtibial amputation.  Patient is currently not wearing his stump shrinker he states that this was lost.  Assessment & Plan: Visit Diagnoses:  1. S/P bilateral BKA (below knee amputation) ([place])     Plan: Patient was given a prescription for biotech for a stump shrinker and a cane to level prosthesis on the right he is wearing a prosthesis on the left.  He is currently undergoing assisted care and [facility].  Patient will continue with their care will proceed with gait training once the prosthesis is fabricated  Follow-Up Instructions: Return in about 1 month (around 05/24/2018).   Ortho Exam  Patient is alert, oriented, no adenopathy, well-dressed, normal affect, normal respiratory effort. Examination patient has a well-healed transtibial amputation on the right.  There is no cellulitis no drainage no wound dehiscence no signs of infection we will harvest the staples today.  Imaging: No results found. No images are attached to the encounter.  Labs: Lab Results  Component Value Date   HGBA1C 7.1 (H) 02/19/2018   HGBA1C 7.2 (H) 03/27/2016   HGBA1C 6.9 11/19/2012   ESRSEDRATE 90 (H) 03/27/2016   CRP 23.4 (H) 03/27/2016   LABURIC 4.8 08/09/2012   REPTSTATUS 04/03/2018 FINAL 03/29/2018   CULT  03/29/2018    NO GROWTH 5 DAYS Performed at [facility], [place] [address] [place], [place] [address]      Lab Results  Component Value Date   ALBUMIN 1.9 (L) 03/27/2018   ALBUMIN 2.1 (L) 03/26/2018   ALBUMIN 2.9 (L) 03/03/2018   PREALBUMIN  13.8 (L) 03/27/2016   LABURIC 4.8 08/09/2012    Body mass index is 36.73 kg/m.  Orders:  No orders of the defined types were placed in this encounter.  No orders of the defined types were placed in this encounter.    Procedures: No procedures performed  Clinical Data: No additional findings.  ROS:  All other systems negative, except as noted in the HPI. Review of Systems  Objective: Vital Signs: Ht 5\' 4"  (1.626 m)   Wt 214 lb (97.1 kg)   BMI 36.73 kg/m   Specialty Comments:  No specialty comments available.  PMFS History: Patient Active Problem List   Diagnosis Date Noted  . Bacteremia   . Acute respiratory distress   . AKI (acute kidney injury) ([place])   . Sepsis ([place])   . Symptomatic anemia   . Severe protein-calorie malnutrition ([place])   . Wound infection   . Dehiscence of amputation stump ([place])   . Osteomyelitis ([place]) 03/26/2018  . Cellulitis of foot without toes, right 03/03/2018  . History of transmetatarsal amputation of right foot ([place]) 02/19/2018  . Amputated toe of right foot ([place]) 01/22/2018  . Gangrene of right foot ([place])   . 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 S/P bilateral BKA (below knee amputation) ([place]) 04/11/2016  . History of tobacco abuse 03/31/2016  . Osteomyelitis of left foot ([place])   . 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 Septic arthritis of left foot ([place]) 03/27/2016  . Solitary pulmonary nodule 08/14/2015  . Precordial chest pain 08/13/2015  . Peripheral arterial disease ([place]) 04/18/2015  . 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 Right anterior knee pain 11/19/2012  . Toenail deformity 01/16/2012  . Gout   . Diabetic neuropathy ([place]) 03/31/2008  . ALCOHOLISM 03/31/2008  . Rosacea 03/31/2008  . GERD 01/31/2008  . COPD (chronic obstructive pulmonary disease) ([place]) 08/03/2007   . DM W/COMPLICATION NOS, TYPE II 05/08/2007  . OBESITY 05/08/2007  . HTN (hypertension) 05/08/2007  . 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 HISTORY OF ASBESTOS EXPOSURE 05/08/2007  . Chronic lower back pain on narcotics  06/16/2005  . HLD (hyperlipidemia) 03/22/2002   Past Medical History:  Diagnosis Date  . Anxiety    Panic attack - 10  years aago- ? panic   . Asthma   . Cellulitis of left foot 2016   Healed and recurred 01/2016  . Cellulitis of right foot 06/30/2012  . Chronic lower back pain 07/01/2012  . COPD (chronic obstructive pulmonary disease) (HCC)    Uses nebulizers at home. Former smoker, current chewing tobacco. 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  Marland Kitchen GERD (gastroesophageal reflux disease)   . Gouty arthritis 07/01/2012   Not attacks in ~5 yrs (03/2016)  . Hepatitis 1967   "in jail when I got it; dr said it was pretty bad; quaranteened X 30d"  . Hyperlipidemia   . Hypertension   . Lung nodule, solitary 07/2015   Due for 1-yr recheck in 07/2017  . Peripheral arterial disease ([place])    critical limb ischemia  . Peripheral neuropathy 07/01/2012  . Seasonal allergies   . Shortness of breath dyspnea   . Skin growth 07/01/2012   right side of nares; "been on there 20 years"  . Substance abuse ([place])   . Type II diabetes mellitus ([place]) 2002   Diagnosed in 2002    Family History  Problem Relation Age of Onset  . Diabetes Sister   . Hypertension Mother     Past Surgical History:  Procedure Laterality Date  . AMPUTATION Left 03/28/2016   Procedure: AMPUTATION left foot 4th and 5 th ray;  Surgeon: Newt Minion, MD;  Location: [facility];  Service: Orthopedics;  Laterality: Left;  . AMPUTATION Left 04/11/2016   Procedure: LEFT BELOW KNEE AMPUTATION;  Surgeon: Newt Minion, MD;  Location: [facility];  Service: Orthopedics;  Laterality: Left;  . AMPUTATION Right 01/22/2018   Procedure: RIGHT FOOT 3RD RAY AMPUTATION;  Surgeon: Newt Minion, MD;  Location: [facility];  Service: Orthopedics;  Laterality: Right;  .  AMPUTATION Right 02/19/2018   Procedure: RIGHT TRANSMETATARSAL AMPUTATION;  Surgeon: Newt Minion, MD;  Location: [facility];  Service: Orthopedics;  Laterality: Right;  . AMPUTATION Right 03/28/2018   Procedure: RIGHT BELOW KNEE AMPUTATION;  Surgeon: Newt Minion, MD;  Location: [facility];  Service: Orthopedics;  Laterality: Right;  . LEG AMPUTATION BELOW KNEE Left 04/11/2016  . LOWER EXTREMITY ANGIOGRAM N/A 04/18/2013   Procedure: LOWER EXTREMITY ANGIOGRAM;  Surgeon: Sherren Mocha, MD;  Location: [facility] C[facility] LAB;  Service: Cardiovascular;  Laterality: N/A;  . LOWER EXTREMITY ANGIOGRAM  08/13/2015   bilateral iliac   . PERIPHERAL VASCULAR CATHETERIZATION Bilateral 08/13/2015  Procedure: Lower Extremity Angiography;  Surgeon: Lorretta Harp, MD;  Location: [facility];  Service: Cardiovascular;  Laterality: Bilateral;  . PERIPHERAL VASCULAR CATHETERIZATION N/A 08/13/2015   Procedure: Abdominal Aortogram;  Surgeon: Lorretta Harp, MD;  Location: [facility];  Service: Cardiovascular;  Laterality: N/A;  . TOE AMPUTATION Right 01/22/2018   3RD TOE RIGHT FOOT   Social History   Occupational History  . Occupation: disabled  Tobacco Use  . Smoking status: Former Smoker    Packs/day: 2.00    Years: 40.00    Pack years: 80.00    Types: Cigarettes    Last attempt to quit: 01/13/2002    Years since quitting: 16.2  . Smokeless tobacco: Current User    Types: Chew  . Tobacco comment: chews a little tobacco  Substance and Sexual Activity  . Alcohol use: Not Currently    Comment: 03/03/18: "Used to drink alot. No alcohol in a long time."  . Drug use: No    Types: Marijuana    Comment: 03/27/2016 "last marijuana was maybe in 2013"  . Sexual activity: Never

## 2018-05-24 ENCOUNTER — Encounter (INDEPENDENT_AMBULATORY_CARE_PROVIDER_SITE_OTHER): Payer: Self-pay | Admitting: Orthopedic Surgery

## 2018-05-24 ENCOUNTER — Ambulatory Visit (INDEPENDENT_AMBULATORY_CARE_PROVIDER_SITE_OTHER): Payer: Medicare (Managed Care) | Admitting: Orthopedic Surgery

## 2018-05-24 DIAGNOSIS — Z89511 Acquired absence of right leg below knee: Secondary | ICD-10-CM

## 2018-05-24 DIAGNOSIS — Z89512 Acquired absence of left leg below knee: Secondary | ICD-10-CM

## 2018-05-24 NOTE — Progress Notes (Signed)
Office Visit Note   Patient: Leslie [name]           Date of Birth: 03-10-1946           MRN: [id number] Visit Date: 05/24/2018              Requested by: [name], MD [address] [place], [place] [address] PCP: [name], MD  Chief Complaint  Patient presents with  . Right Knee - Pain, Follow-up      HPI: Patient is a 72 year old gentleman who is 2 months status post right transtibial amputation.  Patient states he recently has struck his residual limb in a wheelchair drainage laterally.  Assessment & Plan: Visit Diagnoses:  1. S/P bilateral BKA (below knee amputation) ([place])     Plan: The suture was removed medially there was a small sterile abscess that was decompressed the Band-Aid was applied patient will continue with the stump shrinker.  Follow-Up Instructions: Return in about 2 months (around 07/25/2018).   Ortho Exam  Patient is alert, oriented, no adenopathy, well-dressed, normal affect, normal respiratory effort. Examination patient's residual limb is well consolidated there is a little abrasion laterally medially there is a retained suture that was removed.  There was a small sterile abscess this was decompressed.  There is no ascending cellulitis.  The wound area is 2 mm in diameter and 2 mm deep.  Imaging: No results found. No images are attached to the encounter.  Labs: Lab Results  Component Value Date   HGBA1C 7.1 (H) 02/19/2018   HGBA1C 7.2 (H) 03/27/2016   HGBA1C 6.9 11/19/2012   ESRSEDRATE 90 (H) 03/27/2016   CRP 23.4 (H) 03/27/2016   LABURIC 4.8 08/09/2012   REPTSTATUS 04/03/2018 FINAL 03/29/2018   CULT  03/29/2018    NO GROWTH 5 DAYS Performed at [facility], [place] [address], [place], [place] [address]      Lab Results  Component Value Date   ALBUMIN 1.9 (L) 03/27/2018   ALBUMIN 2.1 (L) 03/26/2018   ALBUMIN 2.9 (L) 03/03/2018   PREALBUMIN 13.8 (L) 03/27/2016   LABURIC 4.8 08/09/2012    There is no height  or weight on file to calculate BMI.  Orders:  No orders of the defined types were placed in this encounter.  No orders of the defined types were placed in this encounter.    Procedures: No procedures performed  Clinical Data: No additional findings.  ROS:  All other systems negative, except as noted in the HPI. Review of Systems  Objective: Vital Signs: There were no vitals taken for this visit.  Specialty Comments:  No specialty comments available.  PMFS History: Patient Active Problem List   Diagnosis Date Noted  . Bacteremia   . Acute respiratory distress   . AKI (acute kidney injury) ([place])   . Sepsis ([place])   . Symptomatic anemia   . Severe protein-calorie malnutrition ([place])   . Wound infection   . Dehiscence of amputation stump ([place])   . Osteomyelitis ([place]) 03/26/2018  . Cellulitis of foot without toes, right 03/03/2018  . History of transmetatarsal amputation of right foot ([place]) 02/19/2018  . Amputated toe of right foot ([place]) 01/22/2018  . Gangrene of right foot ([place])   . 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 ([place]) 01/14/2018  . S/P bilateral BKA (below knee amputation) ([place]) 04/11/2016  .  History of tobacco abuse 03/31/2016  . Osteomyelitis of left foot ([place])   . 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 Septic arthritis of left foot ([place]) 03/27/2016  . Solitary pulmonary nodule 08/14/2015  . Precordial chest pain 08/13/2015  . Peripheral arterial disease ([place]) 04/18/2015  . 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 Right anterior knee pain 11/19/2012  . Toenail deformity 01/16/2012  . Gout   . Diabetic neuropathy ([place]) 03/31/2008  . ALCOHOLISM 03/31/2008  . Rosacea 03/31/2008  . GERD 01/31/2008  . COPD (chronic obstructive pulmonary disease) ([place]) 08/03/2007  . DM W/COMPLICATION NOS, TYPE II 05/08/2007  . OBESITY  05/08/2007  . HTN (hypertension) 05/08/2007  . 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 HISTORY OF ASBESTOS EXPOSURE 05/08/2007  . Chronic lower back pain on narcotics  06/16/2005  . HLD (hyperlipidemia) 03/22/2002   Past Medical History:  Diagnosis Date  . Anxiety    Panic attack - 10  years aago- ? panic   . Asthma   . Cellulitis of left foot 2016   Healed and recurred 01/2016  . Cellulitis of right foot 06/30/2012  . Chronic lower back pain 07/01/2012  . COPD (chronic obstructive pulmonary disease) (HCC)    Uses nebulizers at home. Former smoker, current chewing tobacco. 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  Marland Kitchen GERD (gastroesophageal reflux disease)   . Gouty arthritis 07/01/2012   Not attacks in ~5 yrs (03/2016)  . Hepatitis 1967   "in jail when I got it; dr said it was pretty bad; quaranteened X 30d"  . Hyperlipidemia   . Hypertension   . Lung nodule, solitary 07/2015   Due for 1-yr recheck in 07/2017  . Peripheral arterial disease ([place])    critical limb ischemia  . Peripheral neuropathy 07/01/2012  . Seasonal allergies   . Shortness of breath dyspnea   . Skin growth 07/01/2012   right side of nares; "been on there 20 years"  . Substance abuse ([place])   . Type II diabetes mellitus ([place]) 2002   Diagnosed in 2002    Family History  Problem Relation Age of Onset  . Diabetes Sister   . Hypertension Mother     Past Surgical History:  Procedure Laterality Date  . AMPUTATION Left 03/28/2016   Procedure: AMPUTATION left foot 4th and 5 th ray;  Surgeon: Newt Minion, MD;  Location: [facility];  Service: Orthopedics;  Laterality: Left;  . AMPUTATION Left 04/11/2016   Procedure: LEFT BELOW KNEE AMPUTATION;  Surgeon: Newt Minion, MD;  Location: [facility];  Service: Orthopedics;  Laterality: Left;  . AMPUTATION Right 01/22/2018   Procedure: RIGHT FOOT 3RD RAY AMPUTATION;  Surgeon: Newt Minion, MD;  Location: [facility];  Service: Orthopedics;  Laterality: Right;  . AMPUTATION Right 02/19/2018   Procedure: RIGHT TRANSMETATARSAL  AMPUTATION;  Surgeon: Newt Minion, MD;  Location: [facility];  Service: Orthopedics;  Laterality: Right;  . AMPUTATION Right 03/28/2018   Procedure: RIGHT BELOW KNEE AMPUTATION;  Surgeon: Newt Minion, MD;  Location: [facility];  Service: Orthopedics;  Laterality: Right;  . LEG AMPUTATION BELOW KNEE Left 04/11/2016  . LOWER EXTREMITY ANGIOGRAM N/A 04/18/2013   Procedure: LOWER EXTREMITY ANGIOGRAM;  Surgeon: Sherren Mocha, MD;  Location: [facility] LAB;  Service: Cardiovascular;  Laterality: N/A;  . LOWER EXTREMITY ANGIOGRAM  08/13/2015   bilateral iliac   . PERIPHERAL VASCULAR CATHETERIZATION Bilateral 08/13/2015   Procedure: Lower Extremity Angiography;  Surgeon: Lorretta Harp, MD;  Location: [facility];  Service: Cardiovascular;  Laterality: Bilateral;  .  PERIPHERAL VASCULAR CATHETERIZATION N/A 08/13/2015   Procedure: Abdominal Aortogram;  Surgeon: Lorretta Harp, MD;  Location: [facility];  Service: Cardiovascular;  Laterality: N/A;  . TOE AMPUTATION Right 01/22/2018   3RD TOE RIGHT FOOT   Social History   Occupational History  . Occupation: disabled  Tobacco Use  . Smoking status: Former Smoker    Packs/day: 2.00    Years: 40.00    Pack years: 80.00    Types: Cigarettes    Last attempt to quit: 01/13/2002    Years since quitting: 16.3  . Smokeless tobacco: Current User    Types: Chew  . Tobacco comment: chews a little tobacco  Substance and Sexual Activity  . Alcohol use: Not Currently    Comment: 03/03/18: "Used to drink alot. No alcohol in a long time."  . Drug use: No    Types: Marijuana    Comment: 03/27/2016 "last marijuana was maybe in 2013"  . Sexual activity: Never

## 2018-06-21 ENCOUNTER — Ambulatory Visit (INDEPENDENT_AMBULATORY_CARE_PROVIDER_SITE_OTHER): Payer: Medicare (Managed Care) | Admitting: Orthopedic Surgery

## 2018-06-21 ENCOUNTER — Encounter (INDEPENDENT_AMBULATORY_CARE_PROVIDER_SITE_OTHER): Payer: Self-pay | Admitting: Orthopedic Surgery

## 2018-06-21 VITALS — Ht 66.0 in | Wt 205.0 lb

## 2018-06-21 DIAGNOSIS — Z89511 Acquired absence of right leg below knee: Secondary | ICD-10-CM

## 2018-06-21 DIAGNOSIS — Z89512 Acquired absence of left leg below knee: Secondary | ICD-10-CM

## 2018-06-21 NOTE — Progress Notes (Signed)
Office Visit Note   Patient: Leslie [name]           Date of Birth: [birth year]           MRN: [id number] Visit Date: 06/21/2018              Requested by: [name], MD [address] [place], [place] [address] PCP: [name], MD  Chief Complaint  Patient presents with  . Right Leg - Follow-up    03/28/18 Right BKA      HPI: Patient is a 72 year old gentleman who presents 11 weeks status post right transtibial amputation.  Patient is currently going to [facility].  Patient states he was cast for his prosthesis today by Nicole Kindred.  Assessment & Plan: Visit Diagnoses:  1. S/P bilateral BKA (below knee amputation) ([place])     Plan: Patient will begin gait training once the prosthesis is available.  Continue wearing his compression stocking he is wearing a 3 extra-large.  Follow-Up Instructions: Return in about 2 months (around 08/21/2018).   Ortho Exam  Patient is alert, oriented, no adenopathy, well-dressed, normal affect, normal respiratory effort. Examination the incision is well healed well consolidated no redness no cellulitis no drainage he has a little bit of swelling.  Imaging: No results found. No images are attached to the encounter.  Labs: Lab Results  Component Value Date   HGBA1C 7.1 (H) 02/19/2018   HGBA1C 7.2 (H) 03/27/2016   HGBA1C 6.9 11/19/2012   ESRSEDRATE 90 (H) 03/27/2016   CRP 23.4 (H) 03/27/2016   LABURIC 4.8 08/09/2012   REPTSTATUS 04/03/2018 FINAL 03/29/2018   CULT  03/29/2018    NO GROWTH 5 DAYS Performed at [facility], [place] [address] [place], [place] [address]      Lab Results  Component Value Date   ALBUMIN 1.9 (L) 03/27/2018   ALBUMIN 2.1 (L) 03/26/2018   ALBUMIN 2.9 (L) 03/03/2018   PREALBUMIN 13.8 (L) 03/27/2016   LABURIC 4.8 08/09/2012    Body mass index is 33.09 kg/m.  Orders:  No orders of the defined types were placed in this encounter.  No orders of the defined types were placed in this  encounter.    Procedures: No procedures performed  Clinical Data: No additional findings.  ROS:  All other systems negative, except as noted in the HPI. Review of Systems  Objective: Vital Signs: Ht 5\' 6"  (1.676 m)   Wt 205 lb (93 kg)   BMI 33.09 kg/m   Specialty Comments:  No specialty comments available.  PMFS History: Patient Active Problem List   Diagnosis Date Noted  . Bacteremia   . Acute respiratory distress   . AKI (acute kidney injury) ([place])   . Sepsis ([place])   . Symptomatic anemia   . Severe protein-calorie malnutrition ([place])   . Wound infection   . Dehiscence of amputation stump ([place])   . Osteomyelitis ([place]) 03/26/2018  . Cellulitis of foot without toes, right 03/03/2018  . History of transmetatarsal amputation of right foot ([place]) 02/19/2018  . Amputated toe of right foot ([place]) 01/22/2018  . Gangrene of right foot ([place])   . 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 ([place]) 01/14/2018  . S/P bilateral BKA (below knee amputation) ([place]) 04/11/2016  . History of tobacco abuse 03/31/2016  . Osteomyelitis of left foot ([place])   .  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 Septic arthritis of left foot ([place]) 03/27/2016  . Solitary pulmonary nodule 08/14/2015  . Precordial chest pain 08/13/2015  . Peripheral arterial disease ([place]) 04/18/2015  . 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 Right anterior knee pain 11/19/2012  . Toenail deformity 01/16/2012  . Gout   . Diabetic neuropathy ([place]) 03/31/2008  . ALCOHOLISM 03/31/2008  . Rosacea 03/31/2008  . GERD 01/31/2008  . COPD (chronic obstructive pulmonary disease) ([place]) 08/03/2007  . DM W/COMPLICATION NOS, TYPE II 05/08/2007  . OBESITY 05/08/2007  . HTN (hypertension) 05/08/2007  . 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 HISTORY OF ASBESTOS EXPOSURE 05/08/2007  . 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 HLD (hyperlipidemia) 03/22/2002   Past Medical History:  Diagnosis Date  . Anxiety    Panic attack - 10  years aago- ? panic   . Asthma   . Cellulitis of left foot 2016   Healed and recurred 01/2016  . Cellulitis of right foot 06/30/2012  . Chronic lower back pain 07/01/2012  . COPD (chronic obstructive pulmonary disease) (H[facility])    Uses nebulizers at home. Former smoker, current chewing tobacco. 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  Marland Kitchen GERD (gastroesophageal reflux disease)   . Gouty arthritis 07/01/2012   Not attacks in ~5 yrs (03/2016)  . Hepatitis 1967   "in jail when I got it; dr said it was pretty bad; quaranteened X 30d"  . Hyperlipidemia   . Hypertension   . Lung nodule, solitary 07/2015   Due for 1-yr recheck in 07/2017  . Peripheral arterial disease ([place])    critical limb ischemia  . Peripheral neuropathy 07/01/2012  . Seasonal allergies   . Shortness of breath dyspnea   . Skin growth 07/01/2012   right side of nares; "been on there 20 years"  . Substance abuse ([place])   . Type II diabetes mellitus ([place]) 2002   Diagnosed in 2002    Family History  Problem Relation Age of Onset  . Diabetes Sister   . Hypertension Mother     Past Surgical History:  Procedure Laterality Date  . AMPUTATION Left 03/28/2016   Procedure: AMPUTATION left foot 4th and 5 th ray;  Surgeon: Newt Minion, MD;  Location: [facility];  Service: Orthopedics;  Laterality: Left;  . AMPUTATION Left 04/11/2016   Procedure: LEFT BELOW KNEE AMPUTATION;  Surgeon: Newt Minion, MD;  Location: [facility];  Service: Orthopedics;  Laterality: Left;  . AMPUTATION Right 01/22/2018   Procedure: RIGHT FOOT 3RD RAY AMPUTATION;  Surgeon: Newt Minion, MD;  Location: [facility];  Service: Orthopedics;  Laterality: Right;  . AMPUTATION Right 02/19/2018   Procedure: RIGHT TRANSMETATARSAL AMPUTATION;  Surgeon: Newt Minion, MD;  Location: [facility];  Service: Orthopedics;  Laterality: Right;  . AMPUTATION Right 03/28/2018   Procedure: RIGHT  BELOW KNEE AMPUTATION;  Surgeon: Newt Minion, MD;  Location: [facility];  Service: Orthopedics;  Laterality: Right;  . LEG AMPUTATION BELOW KNEE Left 04/11/2016  . LOWER EXTREMITY ANGIOGRAM N/A 04/18/2013   Procedure: LOWER EXTREMITY ANGIOGRAM;  Surgeon: Sherren Mocha, MD;  Location: [facility] LAB;  Service: Cardiovascular;  Laterality: N/A;  . LOWER EXTREMITY ANGIOGRAM  08/13/2015   bilateral iliac   . PERIPHERAL VASCULAR CATHETERIZATION Bilateral 08/13/2015   Procedure: Lower Extremity Angiography;  Surgeon: Lorretta Harp, MD;  Location: [facility];  Service: Cardiovascular;  Laterality: Bilateral;  . PERIPHERAL VASCULAR CATHETERIZATION N/A 08/13/2015   Procedure: Abdominal Aortogram;  Surgeon: Pearletha Forge  Gwenlyn Found, MD;  Location: [facility];  Service: Cardiovascular;  Laterality: N/A;  . TOE AMPUTATION Right 01/22/2018   3RD TOE RIGHT FOOT   Social History   Occupational History  . Occupation: disabled  Tobacco Use  . Smoking status: Former Smoker    Packs/day: 2.00    Years: 40.00    Pack years: 80.00    Types: Cigarettes    Last attempt to quit: 01/13/2002    Years since quitting: 16.4  . Smokeless tobacco: Current User    Types: Chew  . Tobacco comment: chews a little tobacco  Substance and Sexual Activity  . Alcohol use: Not Currently    Comment: 03/03/18: "Used to drink alot. No alcohol in a long time."  . Drug use: No    Types: Marijuana    Comment: 03/27/2016 "last marijuana was maybe in 2013"  . Sexual activity: Never

## 2018-08-24 ENCOUNTER — Ambulatory Visit (INDEPENDENT_AMBULATORY_CARE_PROVIDER_SITE_OTHER): Payer: Medicare (Managed Care) | Admitting: Orthopedic Surgery

## 2018-11-05 IMAGING — CR DG FOOT COMPLETE 3+V*R*
3 series · 3 of 3 positions shown · non-contrast
Comparison: March 03, 2018

CLINICAL DATA: Open wound.  Recent amputation

EXAM:
RIGHT FOOT COMPLETE - 3+ VIEW

[foot ap]
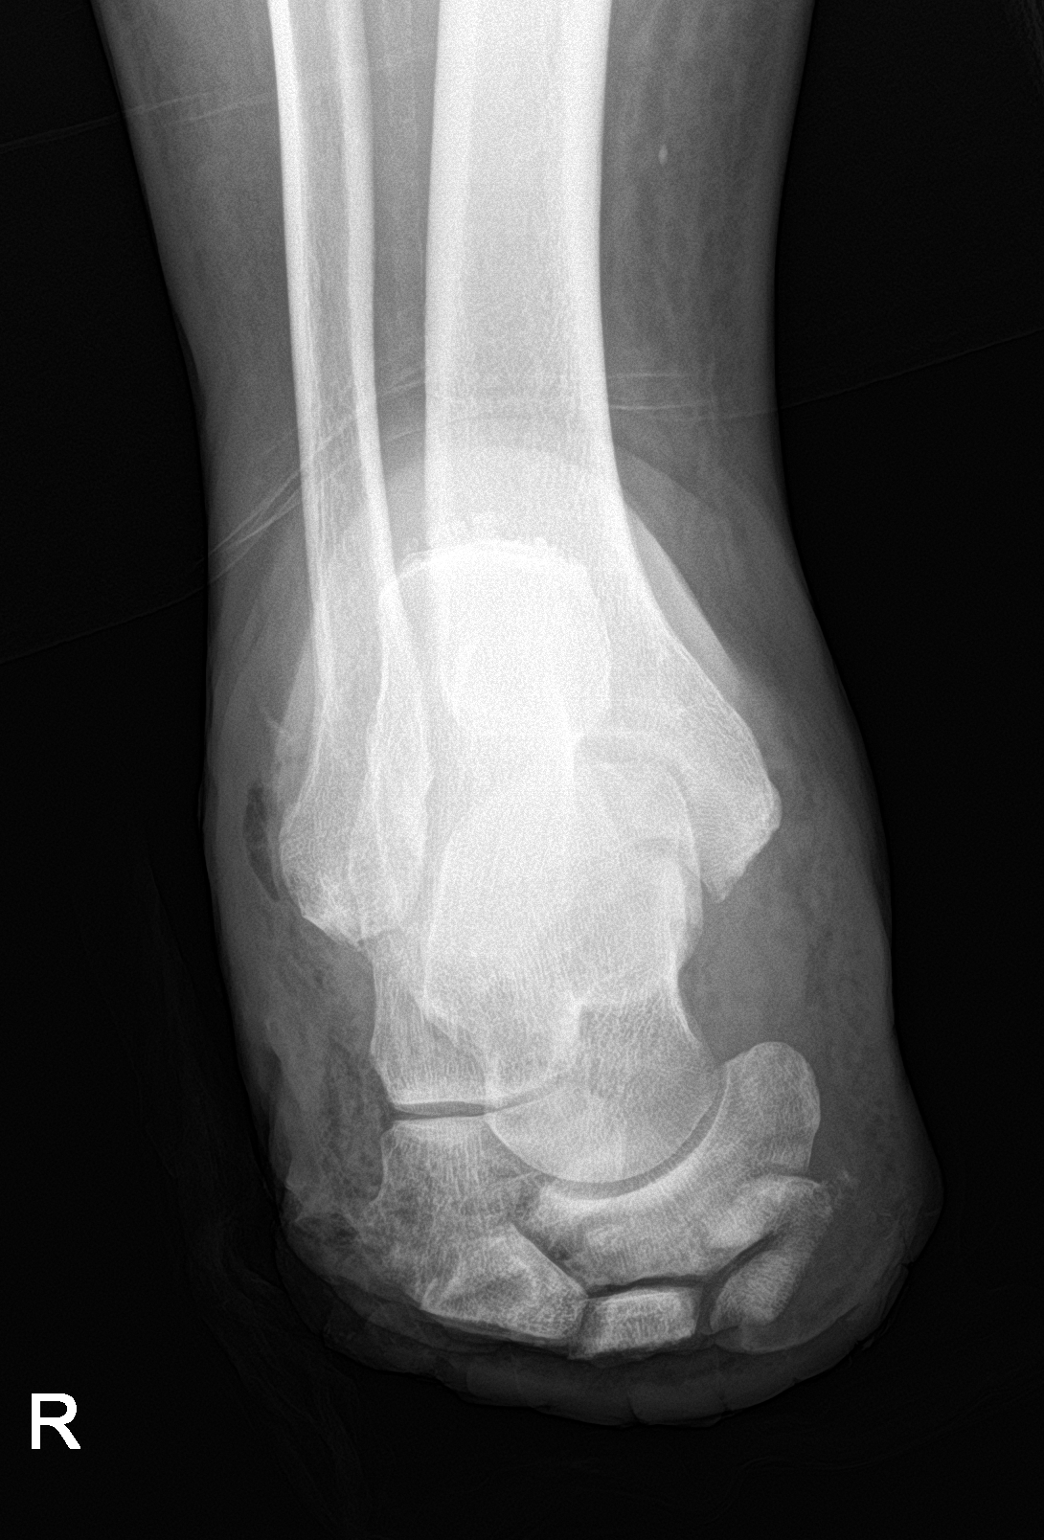

[foot obl]
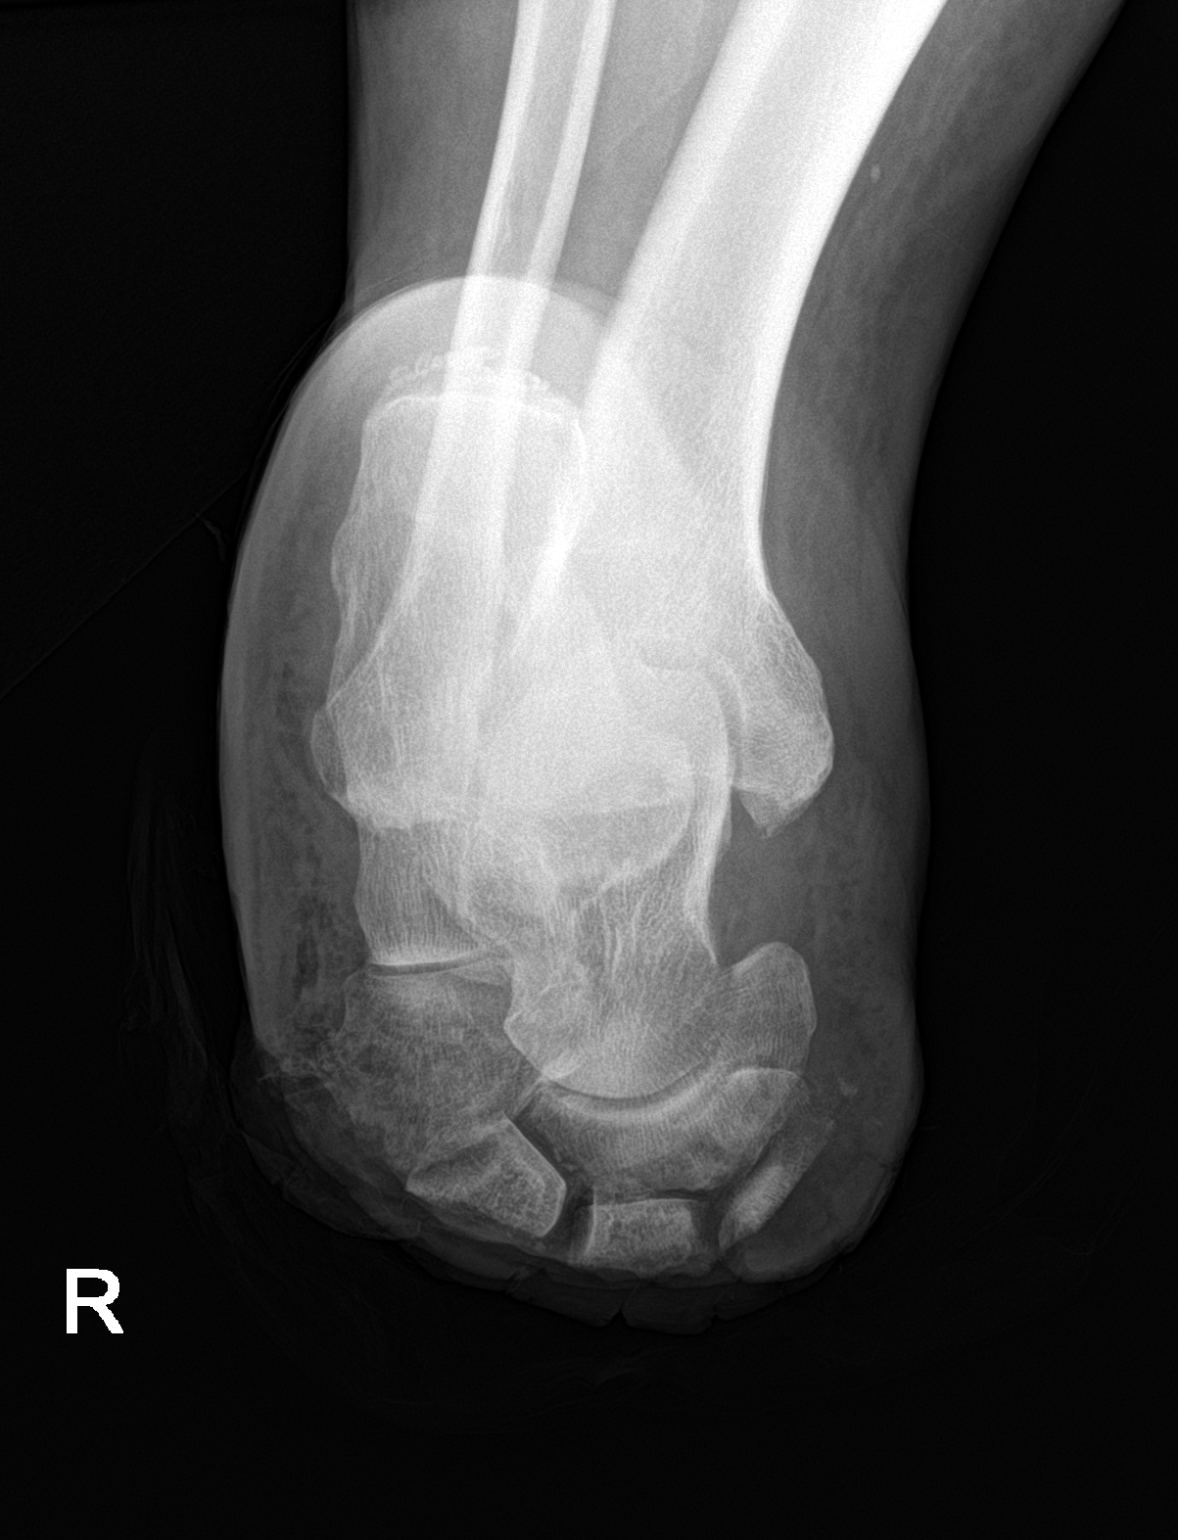

[foot lat]
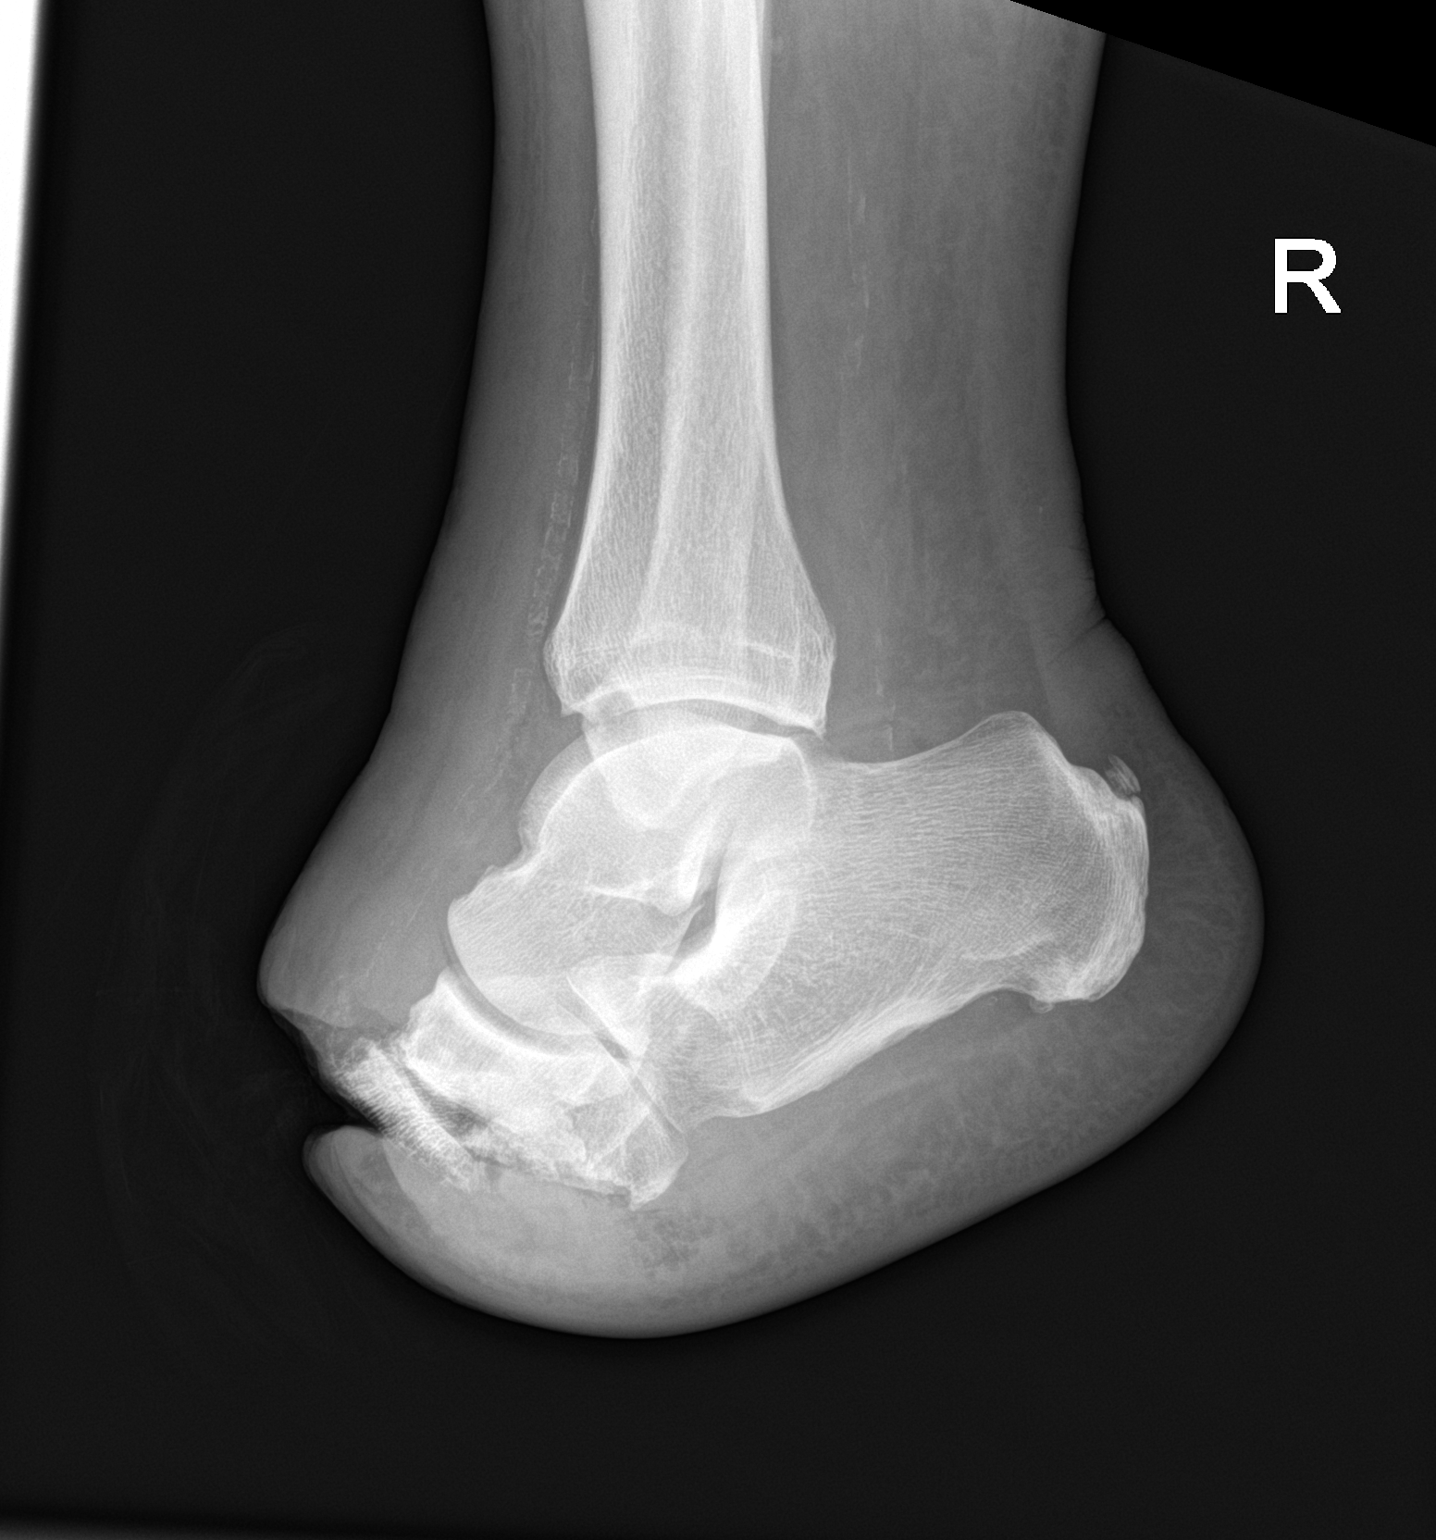

[3 of 3 positions shown; findings below may reference images not displayed]

FINDINGS: Frontal, oblique, and lateral views were obtained. There is apparent
wound at the amputation site distally with a portion of the
remaining proximal cuneiform bones exposed to open air. There is
extensive soft tissue air present. There is cortical irregularity
along the cuboid suggesting osteomyelitis in this area.

Other bony structures appear intact. No acute fracture or
dislocation. There is extensive arterial vascular calcification.
There are spurs arising from the posterior and inferior calcaneus.
IMPRESSION: Assuming no surgery in the interval since most recent radiographs,
there has been apparent breakdown of the stump area distally with
portions of the remaining cuneiform bones exposed to open air. There
is cortical irregularity along the lateral aspect of the cuboid
bone, felt to represent osteomyelitis. There is extensive soft
tissue air concerning for infection in the soft tissues of the
remaining foot.

No acute fracture or dislocation. Foci of arterial vascular
calcification are noted, likely due to diabetes mellitus. There are
posterior inferior calcaneal spurs.

## 2018-11-06 IMAGING — DX DG CHEST 1V PORT
1 series · 1 of 1 positions shown · non-contrast
Comparison: 03/26/2018 chest radiograph

CLINICAL DATA: 71 y/o  M; acute respiratory distress.

EXAM:
PORTABLE CHEST 1 VIEW

[chest ap]
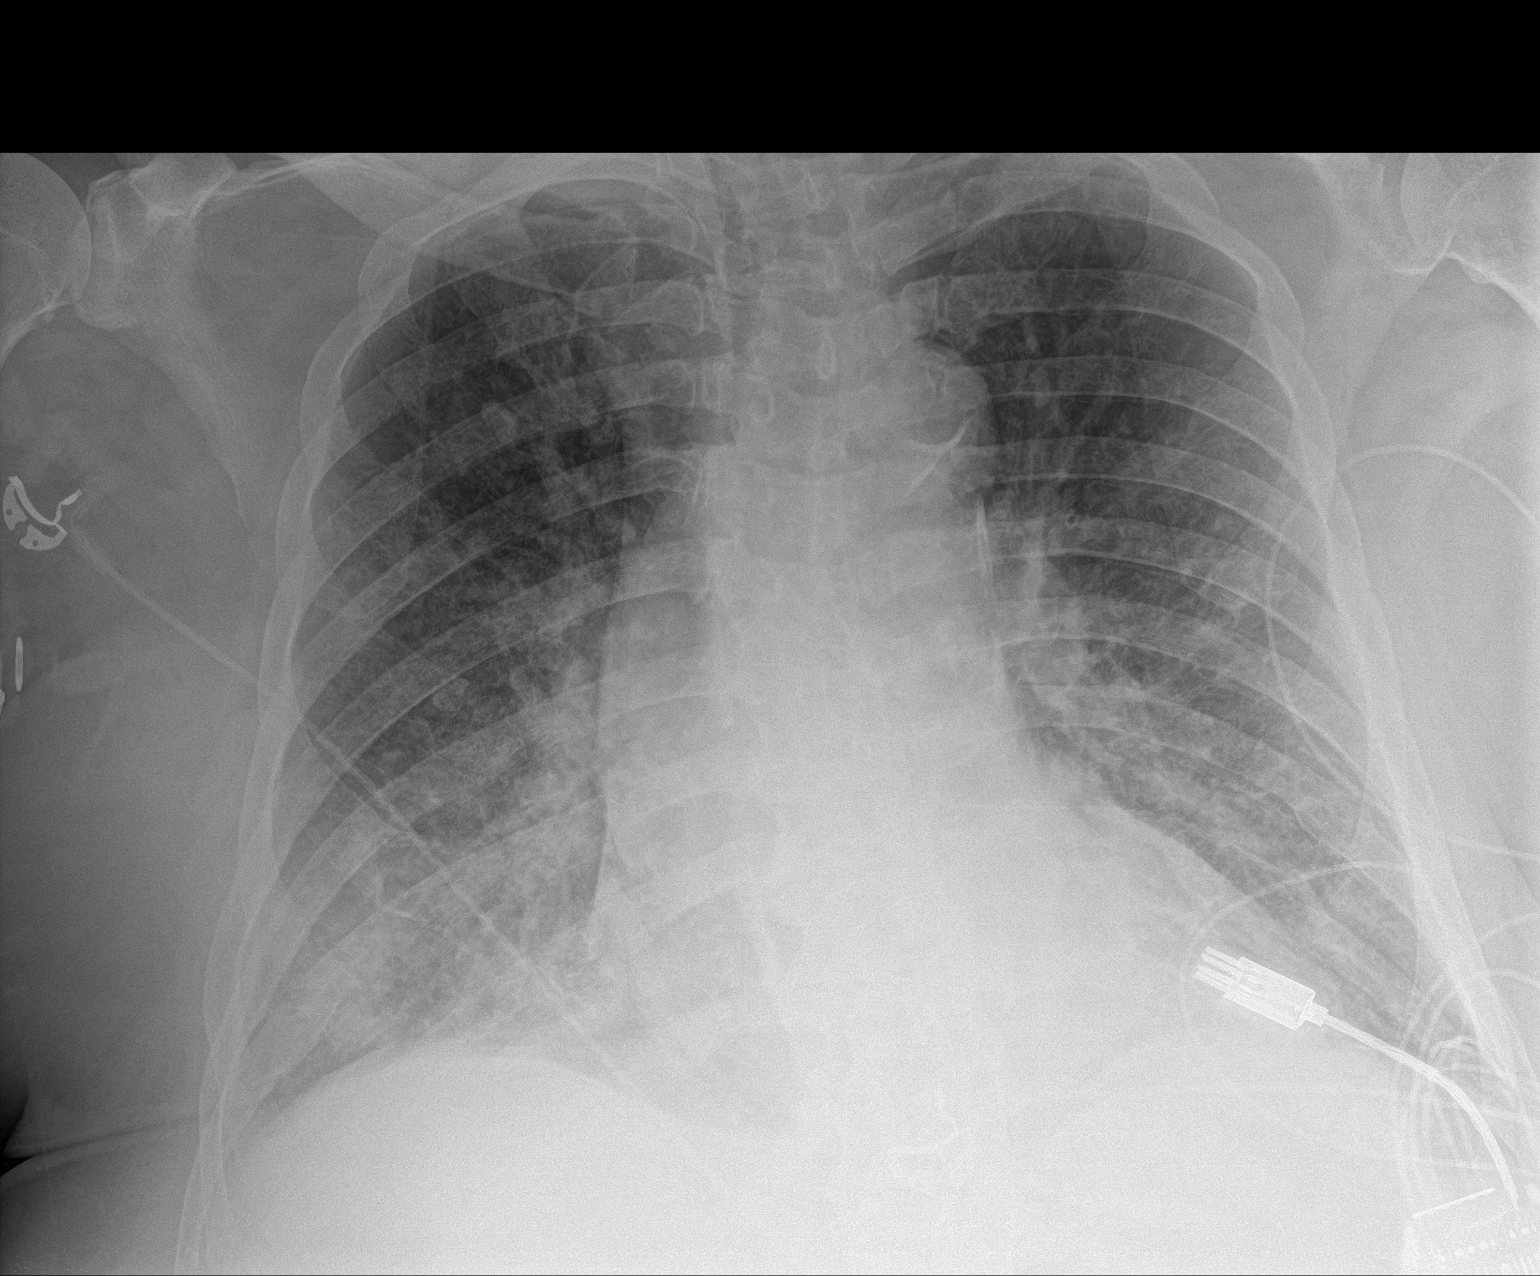

[1 of 1 positions shown; findings below may reference images not displayed]

FINDINGS: Stable enlarged cardiac silhouette given projection and technique.
Aortic atherosclerosis with calcification. Reticular and hazy
opacities of the lungs with basilar predominance. Suspected small
effusions. Mild reverse S curvature of the spine.
IMPRESSION: Reticular and hazy opacities of the lungs with basilar predominance,
probably representing pulmonary edema, possibly pneumonia. Suspected
small effusions.

By: Miiamari Bertling M.D.

## 2018-12-20 ENCOUNTER — Other Ambulatory Visit: Payer: Self-pay | Admitting: Gerontology

## 2018-12-20 ENCOUNTER — Other Ambulatory Visit (HOSPITAL_COMMUNITY): Payer: Self-pay | Admitting: Gerontology

## 2018-12-20 DIAGNOSIS — R911 Solitary pulmonary nodule: Secondary | ICD-10-CM

## 2018-12-20 DIAGNOSIS — J449 Chronic obstructive pulmonary disease, unspecified: Secondary | ICD-10-CM

## 2018-12-27 ENCOUNTER — Ambulatory Visit (HOSPITAL_COMMUNITY): Admission: RE | Admit: 2018-12-27 | Payer: Medicare (Managed Care) | Source: Ambulatory Visit

## 2018-12-29 NOTE — Telephone Encounter (Signed)
Dr Gwenlyn Found  Talked to the edoctor

## 2019-01-03 ENCOUNTER — Ambulatory Visit (HOSPITAL_COMMUNITY)
Admission: RE | Admit: 2019-01-03 | Discharge: 2019-01-03 | Disposition: A | Discharge status: Home / Self Care | Payer: Medicare (Managed Care) | Source: Ambulatory Visit | Attending: Gerontology | Admitting: Gerontology

## 2019-01-03 DIAGNOSIS — R911 Solitary pulmonary nodule: Secondary | ICD-10-CM | POA: Insufficient documentation

## 2019-01-03 DIAGNOSIS — J449 Chronic obstructive pulmonary disease, unspecified: Secondary | ICD-10-CM

## 2019-08-09 ENCOUNTER — Other Ambulatory Visit: Payer: Self-pay | Admitting: Gerontology

## 2019-08-09 ENCOUNTER — Other Ambulatory Visit: Payer: Self-pay

## 2019-08-09 ENCOUNTER — Ambulatory Visit
Admission: RE | Admit: 2019-08-09 | Discharge: 2019-08-09 | Disposition: A | Discharge status: Home / Self Care | Payer: Medicare (Managed Care) | Source: Ambulatory Visit | Attending: Gerontology | Admitting: Gerontology

## 2019-08-09 DIAGNOSIS — R52 Pain, unspecified: Secondary | ICD-10-CM

## 2019-08-09 DIAGNOSIS — W19XXXA Unspecified fall, initial encounter: Secondary | ICD-10-CM

## 2020-08-07 ENCOUNTER — Other Ambulatory Visit: Payer: Self-pay | Admitting: Nurse Practitioner

## 2020-08-07 ENCOUNTER — Ambulatory Visit (HOSPITAL_COMMUNITY)
Admission: RE | Admit: 2020-08-07 | Discharge: 2020-08-07 | Disposition: A | Discharge status: Home / Self Care | Payer: Medicare Other | Source: Ambulatory Visit | Attending: Pulmonary Disease | Admitting: Pulmonary Disease

## 2020-08-07 ENCOUNTER — Telehealth: Payer: Self-pay | Admitting: Physician Assistant

## 2020-08-07 ENCOUNTER — Other Ambulatory Visit: Payer: Self-pay | Admitting: Physician Assistant

## 2020-08-07 ENCOUNTER — Encounter: Payer: Self-pay | Admitting: Physician Assistant

## 2020-08-07 DIAGNOSIS — S98131A Complete traumatic amputation of one right lesser toe, initial encounter: Secondary | ICD-10-CM | POA: Diagnosis present

## 2020-08-07 DIAGNOSIS — Z23 Encounter for immunization: Secondary | ICD-10-CM | POA: Diagnosis not present

## 2020-08-07 DIAGNOSIS — Z87891 Personal history of nicotine dependence: Secondary | ICD-10-CM

## 2020-08-07 DIAGNOSIS — R058 Other specified cough: Secondary | ICD-10-CM | POA: Diagnosis present

## 2020-08-07 DIAGNOSIS — I158 Other secondary hypertension: Secondary | ICD-10-CM | POA: Diagnosis present

## 2020-08-07 DIAGNOSIS — Z20822 Contact with and (suspected) exposure to covid-19: Secondary | ICD-10-CM

## 2020-08-07 DIAGNOSIS — I10 Essential (primary) hypertension: Secondary | ICD-10-CM

## 2020-08-07 DIAGNOSIS — J431 Panlobular emphysema: Secondary | ICD-10-CM

## 2020-08-07 DIAGNOSIS — I70261 Atherosclerosis of native arteries of extremities with gangrene, right leg: Secondary | ICD-10-CM | POA: Diagnosis present

## 2020-08-07 DIAGNOSIS — U071 COVID-19: Secondary | ICD-10-CM | POA: Diagnosis not present

## 2020-08-07 MED ORDER — METHYLPREDNISOLONE SODIUM SUCC 125 MG IJ SOLR: 125.0000 mg | Freq: Once | INTRAMUSCULAR | Status: DC | PRN

## 2020-08-07 MED ORDER — ALBUTEROL SULFATE HFA 108 (90 BASE) MCG/ACT IN AERS: 2.0000 | INHALATION_SPRAY | Freq: Once | RESPIRATORY_TRACT | Status: DC | PRN

## 2020-08-07 MED ORDER — SODIUM CHLORIDE 0.9 % IV SOLN
Freq: Once | INTRAVENOUS | Status: AC
  Filled 2020-08-07: qty 20

## 2020-08-07 MED ORDER — DIPHENHYDRAMINE HCL 50 MG/ML IJ SOLN: 50.0000 mg | Freq: Once | INTRAMUSCULAR | Status: DC | PRN

## 2020-08-07 MED ORDER — FAMOTIDINE IN NACL 20-0.9 MG/50ML-% IV SOLN: 20.0000 mg | Freq: Once | INTRAVENOUS | Status: DC | PRN

## 2020-08-07 MED ORDER — SODIUM CHLORIDE 0.9 % IV SOLN: INTRAVENOUS | Status: DC | PRN

## 2020-08-07 MED ORDER — CLONIDINE HCL 0.1 MG PO TABS
0.1000 mg | ORAL_TABLET | Freq: Once | ORAL | Status: AC
  Administered 2020-08-07: 0.1 mg via ORAL
  Filled 2020-08-07: qty 1

## 2020-08-07 MED ORDER — EPINEPHRINE 0.3 MG/0.3ML IJ SOAJ: 0.3000 mg | Freq: Once | INTRAMUSCULAR | Status: DC | PRN

## 2020-08-07 NOTE — Discharge Instructions (Signed)

## 2020-08-07 NOTE — Telephone Encounter (Signed)
Called to discuss with Leslie [name] about Covid symptoms and the use of casirivimab/imdevimab or bamlanivimab/etesevimab infusion for those with mild to moderate Covid symptoms and at a high risk of hospitalization.   Pt is qualified for this infusion at the monoclonal antibody infusion center due to co-morbid conditions and/or a member of an at-risk group (age>65, BMI>25, CVD, PAD, DMT2, HTN), however we need to discuss with [facility] who coordinates his care and rides. Waiting for call back from them. Symptoms tier reviewed as well as criteria for ending isolation.  Symptoms reviewed that would warrant ED/Hospital evaluation. Preventative practices reviewed. Patient verbalized understanding. Patient advised to call back if he decides that he does want to get infusion. Callback number to the infusion center given. Patient advised to go to Urgent care or ED with severe symptoms. Last date pt would be eligible for infusion is 10/15.    Patient Active Problem List   Diagnosis Date Noted  . Bacteremia   . Acute respiratory distress   . AKI (acute kidney injury) ([place])   . Sepsis ([place])   . Symptomatic anemia   . Severe protein-calorie malnutrition ([place])   . Wound infection   . Dehiscence of amputation stump ([place])   . Osteomyelitis ([place]) 03/26/2018  . Cellulitis of foot without toes, right 03/03/2018  . History of transmetatarsal amputation of right foot ([place]) 02/19/2018  . Amputated toe of right foot ([place]) 01/22/2018  . Gangrene of right foot ([place])   . 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 S/P bilateral BKA (below knee amputation) ([place]) 04/11/2016  . History of tobacco abuse 03/31/2016  . Osteomyelitis of left foot ([place])   . 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 Septic arthritis of left foot ([place]) 03/27/2016  .  Solitary pulmonary nodule 08/14/2015  . Precordial chest pain 08/13/2015  . Peripheral arterial disease ([place]) 04/18/2015  . 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 Right anterior knee pain 11/19/2012  . Toenail deformity 01/16/2012  . Gout   . Diabetic neuropathy ([place]) 03/31/2008  . ALCOHOLISM 03/31/2008  . Rosacea 03/31/2008  . GERD 01/31/2008  . COPD (chronic obstructive pulmonary disease) ([place]) 08/03/2007  . DM W/COMPLICATION NOS, TYPE II 05/08/2007  . OBESITY 05/08/2007  . HTN (hypertension) 05/08/2007  . 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 HISTORY OF ASBESTOS EXPOSURE 05/08/2007  . Chronic lower back pain on narcotics  06/16/2005  . HLD (hyperlipidemia) 03/22/2002    Angelena Form PA-C

## 2020-08-07 NOTE — Progress Notes (Signed)
I connected by phone with Leslie [name] on 08/07/2020 at 10:58 AM to discuss the potential use of a new treatment for mild to moderate COVID-19 viral infection in non-hospitalized patients.  This patient is a 74 y.o. male that meets the FDA criteria for Emergency Use Authorization of COVID monoclonal antibody casirivimab/imdevimab or bamlanivimab/eteseviamb.  Has a (+) direct SARS-CoV-2 viral test result  Has mild or moderate COVID-19   Is NOT hospitalized due to COVID-19  Is within 10 days of symptom onset  Has at least one of the high risk factor(s) for progression to severe COVID-19 and/or hospitalization as defined in EUA.  Specific high risk criteria : Older age (>/= 75 yo), BMI > 25, Diabetes, Cardiovascular disease or hypertension, Chronic Lung Disease and Other high risk medical condition per CDC:  high social vulnerability index   I have spoken and communicated the following to the patient or parent/caregiver regarding COVID monoclonal antibody treatment:  1. FDA has authorized the emergency use for the treatment of mild to moderate COVID-19 in adults and pediatric patients with positive results of direct SARS-CoV-2 viral testing who are 75 years of age and older weighing at least 40 kg, and who are at high risk for progressing to severe COVID-19 and/or hospitalization.  2. The significant known and potential risks and benefits of COVID monoclonal antibody, and the extent to which such potential risks and benefits are unknown.  3. Information on available alternative treatments and the risks and benefits of those alternatives, including clinical trials.  4. Patients treated with COVID monoclonal antibody should continue to self-isolate and use infection control measures (e.g., wear mask, isolate, social distance, avoid sharing personal items, clean and disinfect "high touch" surfaces, and frequent handwashing) according to CDC guidelines.   5. The patient or parent/caregiver has  the option to accept or refuse COVID monoclonal antibody treatment.  After reviewing this information with the patient, the patient has agreed to receive one of the available covid 19 monoclonal antibodies and will be provided an appropriate fact sheet prior to infusion.   Sx onset 10/5. Set up for infusion today at 12:30pm. [facility] will provide transportation. He is fully vaccinated.   Angelena Form, PA-C 08/07/2020 10:58 AM

## 2020-08-07 NOTE — Progress Notes (Signed)
  Diagnosis: COVID-19  Physician: Dr. Asencion Noble  Procedure: [facility] Infusion Clinic Med: bamlanivimab\etesevimab infusion - Provided patient with bamlanimivab\etesevimab fact sheet for patients, parents and caregivers prior to infusion.  Complications: No immediate complications noted.  Discharge: Discharged home   Gaye Alken 08/07/2020

## 2020-12-12 ENCOUNTER — Other Ambulatory Visit: Payer: Self-pay | Admitting: Family Medicine

## 2020-12-12 DIAGNOSIS — T148XXA Other injury of unspecified body region, initial encounter: Secondary | ICD-10-CM

## 2020-12-13 ENCOUNTER — Ambulatory Visit
Admission: RE | Admit: 2020-12-13 | Discharge: 2020-12-13 | Disposition: A | Discharge status: Home / Self Care | Payer: Medicare (Managed Care) | Source: Ambulatory Visit | Attending: Family Medicine | Admitting: Family Medicine

## 2020-12-13 DIAGNOSIS — T148XXA Other injury of unspecified body region, initial encounter: Secondary | ICD-10-CM

## 2021-08-12 ENCOUNTER — Other Ambulatory Visit: Payer: Self-pay | Admitting: Family Medicine

## 2021-08-12 ENCOUNTER — Ambulatory Visit
Admission: RE | Admit: 2021-08-12 | Discharge: 2021-08-12 | Disposition: A | Discharge status: Home / Self Care | Payer: Medicare (Managed Care) | Source: Ambulatory Visit | Attending: Family Medicine | Admitting: Family Medicine

## 2021-08-12 ENCOUNTER — Other Ambulatory Visit: Payer: Self-pay | Admitting: Nurse Practitioner

## 2021-08-12 DIAGNOSIS — M898X5 Other specified disorders of bone, thigh: Secondary | ICD-10-CM

## 2021-08-12 DIAGNOSIS — E1151 Type 2 diabetes mellitus with diabetic peripheral angiopathy without gangrene: Secondary | ICD-10-CM

## 2021-08-14 ENCOUNTER — Telehealth: Payer: Self-pay | Admitting: Orthopedic Surgery

## 2021-08-14 NOTE — Telephone Encounter (Signed)
Do you want to review xray or have [place] call and make an appt for this afternoon for eval?

## 2021-08-14 NOTE — Telephone Encounter (Signed)
[facility] called and pt had xray wondering if you could look over it and also if you can squeeze him in asap   CB [phone] (britney)

## 2021-08-14 NOTE — Telephone Encounter (Signed)
Do you mind calling [facility] and offering this patient an appointment with duda at patients convenience for the thigh pain

## 2021-08-15 ENCOUNTER — Ambulatory Visit (INDEPENDENT_AMBULATORY_CARE_PROVIDER_SITE_OTHER): Payer: Medicare (Managed Care) | Admitting: Orthopedic Surgery

## 2021-08-15 ENCOUNTER — Other Ambulatory Visit: Payer: Self-pay

## 2021-08-15 ENCOUNTER — Telehealth: Payer: Self-pay | Admitting: Orthopedic Surgery

## 2021-08-15 DIAGNOSIS — Z89512 Acquired absence of left leg below knee: Secondary | ICD-10-CM

## 2021-08-15 DIAGNOSIS — Z89511 Acquired absence of right leg below knee: Secondary | ICD-10-CM

## 2021-08-15 NOTE — Telephone Encounter (Signed)
Spoke with Griselda Miner with [facility] and she advised will have auth put in prior to patient's appointment today.

## 2021-08-15 NOTE — Telephone Encounter (Signed)
[facility]ed [facility] left message on voicemail for Leslie [name] to return call to obtain authorization for today's visit    [phone]

## 2021-08-16 ENCOUNTER — Other Ambulatory Visit: Payer: Self-pay

## 2021-08-16 ENCOUNTER — Ambulatory Visit: Payer: Medicare (Managed Care) | Admitting: Family

## 2021-08-16 DIAGNOSIS — I739 Peripheral vascular disease, unspecified: Secondary | ICD-10-CM

## 2021-08-18 ENCOUNTER — Emergency Department (HOSPITAL_COMMUNITY): Payer: Medicare (Managed Care)

## 2021-08-18 ENCOUNTER — Encounter (HOSPITAL_COMMUNITY): Payer: Self-pay

## 2021-08-18 ENCOUNTER — Inpatient Hospital Stay (HOSPITAL_COMMUNITY): Payer: Medicare (Managed Care)

## 2021-08-18 ENCOUNTER — Inpatient Hospital Stay (HOSPITAL_COMMUNITY)
Admission: EM | Admit: 2021-08-18 | Discharge: 2021-09-13 | DRG: 853 | Disposition: A | Discharge status: Skilled Nursing Facility | Payer: Medicare (Managed Care) | Attending: Internal Medicine | Admitting: Internal Medicine

## 2021-08-18 DIAGNOSIS — R6520 Severe sepsis without septic shock: Secondary | ICD-10-CM | POA: Diagnosis not present

## 2021-08-18 DIAGNOSIS — G8929 Other chronic pain: Secondary | ICD-10-CM | POA: Diagnosis present

## 2021-08-18 DIAGNOSIS — L89159 Pressure ulcer of sacral region, unspecified stage: Secondary | ICD-10-CM | POA: Diagnosis present

## 2021-08-18 DIAGNOSIS — K760 Fatty (change of) liver, not elsewhere classified: Secondary | ICD-10-CM | POA: Diagnosis present

## 2021-08-18 DIAGNOSIS — M00862 Arthritis due to other bacteria, left knee: Secondary | ICD-10-CM | POA: Diagnosis not present

## 2021-08-18 DIAGNOSIS — I708 Atherosclerosis of other arteries: Secondary | ICD-10-CM | POA: Diagnosis present

## 2021-08-18 DIAGNOSIS — F1011 Alcohol abuse, in remission: Secondary | ICD-10-CM | POA: Diagnosis present

## 2021-08-18 DIAGNOSIS — Z89512 Acquired absence of left leg below knee: Secondary | ICD-10-CM

## 2021-08-18 DIAGNOSIS — Z20822 Contact with and (suspected) exposure to covid-19: Secondary | ICD-10-CM | POA: Diagnosis present

## 2021-08-18 DIAGNOSIS — I70268 Atherosclerosis of native arteries of extremities with gangrene, other extremity: Secondary | ICD-10-CM | POA: Diagnosis present

## 2021-08-18 DIAGNOSIS — K264 Chronic or unspecified duodenal ulcer with hemorrhage: Secondary | ICD-10-CM | POA: Diagnosis present

## 2021-08-18 DIAGNOSIS — A4901 Methicillin susceptible Staphylococcus aureus infection, unspecified site: Secondary | ICD-10-CM | POA: Diagnosis not present

## 2021-08-18 DIAGNOSIS — K3182 Dieulafoy lesion (hemorrhagic) of stomach and duodenum: Secondary | ICD-10-CM | POA: Diagnosis present

## 2021-08-18 DIAGNOSIS — E860 Dehydration: Secondary | ICD-10-CM | POA: Diagnosis present

## 2021-08-18 DIAGNOSIS — K269 Duodenal ulcer, unspecified as acute or chronic, without hemorrhage or perforation: Secondary | ICD-10-CM

## 2021-08-18 DIAGNOSIS — Z7982 Long term (current) use of aspirin: Secondary | ICD-10-CM

## 2021-08-18 DIAGNOSIS — K209 Esophagitis, unspecified without bleeding: Secondary | ICD-10-CM | POA: Diagnosis not present

## 2021-08-18 DIAGNOSIS — M009 Pyogenic arthritis, unspecified: Secondary | ICD-10-CM

## 2021-08-18 DIAGNOSIS — E669 Obesity, unspecified: Secondary | ICD-10-CM | POA: Diagnosis present

## 2021-08-18 DIAGNOSIS — R338 Other retention of urine: Secondary | ICD-10-CM | POA: Diagnosis not present

## 2021-08-18 DIAGNOSIS — E785 Hyperlipidemia, unspecified: Secondary | ICD-10-CM | POA: Diagnosis present

## 2021-08-18 DIAGNOSIS — E1152 Type 2 diabetes mellitus with diabetic peripheral angiopathy with gangrene: Secondary | ICD-10-CM | POA: Diagnosis present

## 2021-08-18 DIAGNOSIS — Z6832 Body mass index (BMI) 32.0-32.9, adult: Secondary | ICD-10-CM

## 2021-08-18 DIAGNOSIS — E11649 Type 2 diabetes mellitus with hypoglycemia without coma: Secondary | ICD-10-CM | POA: Diagnosis present

## 2021-08-18 DIAGNOSIS — L89322 Pressure ulcer of left buttock, stage 2: Secondary | ICD-10-CM | POA: Diagnosis present

## 2021-08-18 DIAGNOSIS — F05 Delirium due to known physiological condition: Secondary | ICD-10-CM | POA: Diagnosis not present

## 2021-08-18 DIAGNOSIS — K2990 Gastroduodenitis, unspecified, without bleeding: Secondary | ICD-10-CM | POA: Diagnosis not present

## 2021-08-18 DIAGNOSIS — R4182 Altered mental status, unspecified: Secondary | ICD-10-CM | POA: Diagnosis present

## 2021-08-18 DIAGNOSIS — M00062 Staphylococcal arthritis, left knee: Secondary | ICD-10-CM | POA: Diagnosis present

## 2021-08-18 DIAGNOSIS — E46 Unspecified protein-calorie malnutrition: Secondary | ICD-10-CM

## 2021-08-18 DIAGNOSIS — J449 Chronic obstructive pulmonary disease, unspecified: Secondary | ICD-10-CM | POA: Diagnosis present

## 2021-08-18 DIAGNOSIS — E1122 Type 2 diabetes mellitus with diabetic chronic kidney disease: Secondary | ICD-10-CM | POA: Diagnosis present

## 2021-08-18 DIAGNOSIS — K2100 Gastro-esophageal reflux disease with esophagitis, without bleeding: Secondary | ICD-10-CM | POA: Diagnosis present

## 2021-08-18 DIAGNOSIS — N179 Acute kidney failure, unspecified: Secondary | ICD-10-CM | POA: Diagnosis not present

## 2021-08-18 DIAGNOSIS — D62 Acute posthemorrhagic anemia: Secondary | ICD-10-CM | POA: Diagnosis not present

## 2021-08-18 DIAGNOSIS — Z8249 Family history of ischemic heart disease and other diseases of the circulatory system: Secondary | ICD-10-CM

## 2021-08-18 DIAGNOSIS — N401 Enlarged prostate with lower urinary tract symptoms: Secondary | ICD-10-CM | POA: Diagnosis not present

## 2021-08-18 DIAGNOSIS — R9431 Abnormal electrocardiogram [ECG] [EKG]: Secondary | ICD-10-CM | POA: Diagnosis not present

## 2021-08-18 DIAGNOSIS — R1310 Dysphagia, unspecified: Secondary | ICD-10-CM | POA: Diagnosis not present

## 2021-08-18 DIAGNOSIS — A4101 Sepsis due to Methicillin susceptible Staphylococcus aureus: Secondary | ICD-10-CM | POA: Diagnosis present

## 2021-08-18 DIAGNOSIS — K920 Hematemesis: Secondary | ICD-10-CM | POA: Diagnosis not present

## 2021-08-18 DIAGNOSIS — I3481 Nonrheumatic mitral (valve) annulus calcification: Secondary | ICD-10-CM | POA: Diagnosis present

## 2021-08-18 DIAGNOSIS — E876 Hypokalemia: Secondary | ICD-10-CM | POA: Diagnosis not present

## 2021-08-18 DIAGNOSIS — I130 Hypertensive heart and chronic kidney disease with heart failure and stage 1 through stage 4 chronic kidney disease, or unspecified chronic kidney disease: Secondary | ICD-10-CM | POA: Diagnosis present

## 2021-08-18 DIAGNOSIS — L03116 Cellulitis of left lower limb: Secondary | ICD-10-CM | POA: Diagnosis not present

## 2021-08-18 DIAGNOSIS — N182 Chronic kidney disease, stage 2 (mild): Secondary | ICD-10-CM | POA: Diagnosis present

## 2021-08-18 DIAGNOSIS — Z888 Allergy status to other drugs, medicaments and biological substances status: Secondary | ICD-10-CM

## 2021-08-18 DIAGNOSIS — L8990 Pressure ulcer of unspecified site, unspecified stage: Secondary | ICD-10-CM | POA: Insufficient documentation

## 2021-08-18 DIAGNOSIS — G9341 Metabolic encephalopathy: Secondary | ICD-10-CM | POA: Diagnosis not present

## 2021-08-18 DIAGNOSIS — Z79899 Other long term (current) drug therapy: Secondary | ICD-10-CM

## 2021-08-18 DIAGNOSIS — M545 Low back pain, unspecified: Secondary | ICD-10-CM | POA: Diagnosis present

## 2021-08-18 DIAGNOSIS — G9340 Encephalopathy, unspecified: Secondary | ICD-10-CM | POA: Diagnosis not present

## 2021-08-18 DIAGNOSIS — Z4659 Encounter for fitting and adjustment of other gastrointestinal appliance and device: Secondary | ICD-10-CM

## 2021-08-18 DIAGNOSIS — E1142 Type 2 diabetes mellitus with diabetic polyneuropathy: Secondary | ICD-10-CM | POA: Diagnosis present

## 2021-08-18 DIAGNOSIS — I5032 Chronic diastolic (congestive) heart failure: Secondary | ICD-10-CM | POA: Diagnosis present

## 2021-08-18 DIAGNOSIS — K2970 Gastritis, unspecified, without bleeding: Secondary | ICD-10-CM | POA: Diagnosis not present

## 2021-08-18 DIAGNOSIS — Z87891 Personal history of nicotine dependence: Secondary | ICD-10-CM

## 2021-08-18 DIAGNOSIS — K254 Chronic or unspecified gastric ulcer with hemorrhage: Secondary | ICD-10-CM | POA: Diagnosis present

## 2021-08-18 DIAGNOSIS — E871 Hypo-osmolality and hyponatremia: Secondary | ICD-10-CM | POA: Diagnosis not present

## 2021-08-18 DIAGNOSIS — K921 Melena: Secondary | ICD-10-CM | POA: Diagnosis not present

## 2021-08-18 DIAGNOSIS — I10 Essential (primary) hypertension: Secondary | ICD-10-CM | POA: Diagnosis not present

## 2021-08-18 DIAGNOSIS — N170 Acute kidney failure with tubular necrosis: Secondary | ICD-10-CM | POA: Diagnosis present

## 2021-08-18 DIAGNOSIS — E162 Hypoglycemia, unspecified: Secondary | ICD-10-CM

## 2021-08-18 DIAGNOSIS — R69 Illness, unspecified: Secondary | ICD-10-CM

## 2021-08-18 DIAGNOSIS — N171 Acute kidney failure with acute cortical necrosis: Secondary | ICD-10-CM | POA: Diagnosis not present

## 2021-08-18 DIAGNOSIS — Z7984 Long term (current) use of oral hypoglycemic drugs: Secondary | ICD-10-CM

## 2021-08-18 DIAGNOSIS — Z89511 Acquired absence of right leg below knee: Secondary | ICD-10-CM | POA: Diagnosis not present

## 2021-08-18 DIAGNOSIS — E861 Hypovolemia: Secondary | ICD-10-CM | POA: Diagnosis present

## 2021-08-18 DIAGNOSIS — Z9889 Other specified postprocedural states: Secondary | ICD-10-CM | POA: Diagnosis not present

## 2021-08-18 DIAGNOSIS — E119 Type 2 diabetes mellitus without complications: Secondary | ICD-10-CM | POA: Diagnosis not present

## 2021-08-18 DIAGNOSIS — Z833 Family history of diabetes mellitus: Secondary | ICD-10-CM

## 2021-08-18 DIAGNOSIS — L304 Erythema intertrigo: Secondary | ICD-10-CM | POA: Diagnosis not present

## 2021-08-18 DIAGNOSIS — Z794 Long term (current) use of insulin: Secondary | ICD-10-CM

## 2021-08-18 DIAGNOSIS — K922 Gastrointestinal hemorrhage, unspecified: Secondary | ICD-10-CM | POA: Diagnosis not present

## 2021-08-18 DIAGNOSIS — L98499 Non-pressure chronic ulcer of skin of other sites with unspecified severity: Secondary | ICD-10-CM | POA: Diagnosis present

## 2021-08-18 LAB — RESP PANEL BY RT-PCR (FLU A&B, COVID) ARPGX2
Influenza A by PCR: NEGATIVE
Influenza B by PCR: NEGATIVE
SARS Coronavirus 2 by RT PCR: NEGATIVE

## 2021-08-18 LAB — CBC WITH DIFFERENTIAL/PLATELET
Abs Immature Granulocytes: 1.11 10*3/uL — ABNORMAL HIGH (ref 0.00–0.07)
Basophils Absolute: 0.1 10*3/uL (ref 0.0–0.1)
Basophils Relative: 1 %
Eosinophils Absolute: 0.4 10*3/uL (ref 0.0–0.5)
Eosinophils Relative: 1 %
HCT: 28.2 % — ABNORMAL LOW (ref 39.0–52.0)
Hemoglobin: 9.2 g/dL — ABNORMAL LOW (ref 13.0–17.0)
Immature Granulocytes: 4 %
Lymphocytes Relative: 4 %
Lymphs Abs: 1.1 10*3/uL (ref 0.7–4.0)
MCH: 27.6 pg (ref 26.0–34.0)
MCHC: 32.6 g/dL (ref 30.0–36.0)
MCV: 84.7 fL (ref 80.0–100.0)
Monocytes Absolute: 2 10*3/uL — ABNORMAL HIGH (ref 0.1–1.0)
Monocytes Relative: 7 %
Neutro Abs: 22.3 10*3/uL — ABNORMAL HIGH (ref 1.7–7.7)
Neutrophils Relative %: 83 %
Platelets: 403 10*3/uL — ABNORMAL HIGH (ref 150–400)
RBC: 3.33 MIL/uL — ABNORMAL LOW (ref 4.22–5.81)
RDW: 15.9 % — ABNORMAL HIGH (ref 11.5–15.5)
WBC: 27.1 10*3/uL — ABNORMAL HIGH (ref 4.0–10.5)
nRBC: 0 % (ref 0.0–0.2)

## 2021-08-18 LAB — COMPREHENSIVE METABOLIC PANEL
ALT: 14 U/L (ref 0–44)
AST: 37 U/L (ref 15–41)
Albumin: 2 g/dL — ABNORMAL LOW (ref 3.5–5.0)
Alkaline Phosphatase: 168 U/L — ABNORMAL HIGH (ref 38–126)
Anion gap: 17 — ABNORMAL HIGH (ref 5–15)
BUN: 77 mg/dL — ABNORMAL HIGH (ref 8–23)
CO2: 19 mmol/L — ABNORMAL LOW (ref 22–32)
Calcium: 8.1 mg/dL — ABNORMAL LOW (ref 8.9–10.3)
Chloride: 90 mmol/L — ABNORMAL LOW (ref 98–111)
Creatinine, Ser: 5.68 mg/dL — ABNORMAL HIGH (ref 0.61–1.24)
GFR, Estimated: 10 mL/min — ABNORMAL LOW (ref >60)
Glucose, Bld: 100 mg/dL — ABNORMAL HIGH (ref 70–99)
Potassium: 4.4 mmol/L (ref 3.5–5.1)
Sodium: 126 mmol/L — ABNORMAL LOW (ref 135–145)
Total Bilirubin: 1.8 mg/dL — ABNORMAL HIGH (ref 0.3–1.2)
Total Protein: 6.7 g/dL (ref 6.5–8.1)

## 2021-08-18 LAB — PROTIME-INR
INR: 1.2 (ref 0.8–1.2)
Prothrombin Time: 15.6 seconds — ABNORMAL HIGH (ref 11.4–15.2)

## 2021-08-18 LAB — I-STAT ARTERIAL BLOOD GAS, ED
Acid-base deficit: 5 mmol/L — ABNORMAL HIGH (ref 0.0–2.0)
Bicarbonate: 19.8 mmol/L — ABNORMAL LOW (ref 20.0–28.0)
Calcium, Ion: 0.98 mmol/L — ABNORMAL LOW (ref 1.15–1.40)
HCT: 26 % — ABNORMAL LOW (ref 39.0–52.0)
Hemoglobin: 8.8 g/dL — ABNORMAL LOW (ref 13.0–17.0)
O2 Saturation: 95 %
Patient temperature: 99.1
Potassium: 4.4 mmol/L (ref 3.5–5.1)
Sodium: 128 mmol/L — ABNORMAL LOW (ref 135–145)
TCO2: 21 mmol/L — ABNORMAL LOW (ref 22–32)
pCO2 arterial: 33.1 mmHg (ref 32.0–48.0)
pH, Arterial: 7.386 (ref 7.350–7.450)
pO2, Arterial: 79 mmHg — ABNORMAL LOW (ref 83.0–108.0)

## 2021-08-18 LAB — ETHANOL: Alcohol, Ethyl (B): <10 mg/dL (ref <10)

## 2021-08-18 LAB — APTT: aPTT: 51 seconds — ABNORMAL HIGH (ref 24–36)

## 2021-08-18 LAB — CBG MONITORING, ED
Glucose-Capillary: 107 mg/dL — ABNORMAL HIGH (ref 70–99)
Glucose-Capillary: 72 mg/dL (ref 70–99)
Glucose-Capillary: 60 mg/dL — ABNORMAL LOW (ref 70–99)
Glucose-Capillary: 111 mg/dL — ABNORMAL HIGH (ref 70–99)

## 2021-08-18 LAB — LACTIC ACID, PLASMA: Lactic Acid, Venous: 2.7 mmol/L (ref 0.5–1.9)

## 2021-08-18 LAB — CK: Total CK: 221 U/L (ref 49–397)

## 2021-08-18 MED ORDER — FOLIC ACID 1 MG PO TABS
1.0000 mg | ORAL_TABLET | Freq: Every day | ORAL | Status: DC
  Administered 2021-08-20: 1 mg via ORAL
  Filled 2021-08-18: qty 1

## 2021-08-18 MED ORDER — SODIUM CHLORIDE 0.9 % IV SOLN
1.0000 g | INTRAVENOUS | Status: DC
  Administered 2021-08-20 (×2): 1 g via INTRAVENOUS
  Filled 2021-08-18 (×4): qty 1

## 2021-08-18 MED ORDER — ACETAMINOPHEN 325 MG PO TABS
650.0000 mg | ORAL_TABLET | Freq: Four times a day (QID) | ORAL | Status: DC | PRN
  Administered 2021-08-27: 650 mg via ORAL

## 2021-08-18 MED ORDER — LACTATED RINGERS IV BOLUS: 1000.0000 mL | Freq: Once | INTRAVENOUS | Status: DC

## 2021-08-18 MED ORDER — LACTATED RINGERS IV SOLN: INTRAVENOUS | Status: DC

## 2021-08-18 MED ORDER — SODIUM CHLORIDE 0.9 % IV BOLUS
1000.0000 mL | Freq: Once | INTRAVENOUS | Status: AC
  Administered 2021-08-18: 1000 mL via INTRAVENOUS

## 2021-08-18 MED ORDER — DEXTROSE 50 % IV SOLN
1.0000 | Freq: Once | INTRAVENOUS | Status: DC | PRN
Start: 2021-08-18 — End: 2021-08-18

## 2021-08-18 MED ORDER — DEXTROSE 50 % IV SOLN
1.0000 | Freq: Once | INTRAVENOUS | Status: AC | PRN
  Administered 2021-08-18: 50 mL via INTRAVENOUS
  Filled 2021-08-18: qty 50

## 2021-08-18 MED ORDER — VANCOMYCIN VARIABLE DOSE PER UNSTABLE RENAL FUNCTION (PHARMACIST DOSING): Status: DC

## 2021-08-18 MED ORDER — METRONIDAZOLE 500 MG/100ML IV SOLN
500.0000 mg | Freq: Once | INTRAVENOUS | Status: AC
  Administered 2021-08-18: 500 mg via INTRAVENOUS
  Filled 2021-08-18: qty 100

## 2021-08-18 MED ORDER — SENNOSIDES-DOCUSATE SODIUM 8.6-50 MG PO TABS: 1.0000 | ORAL_TABLET | Freq: Every evening | ORAL | Status: DC | PRN

## 2021-08-18 MED ORDER — VANCOMYCIN HCL 2000 MG/400ML IV SOLN
2000.0000 mg | Freq: Once | INTRAVENOUS | Status: AC
  Administered 2021-08-18: 2000 mg via INTRAVENOUS
  Filled 2021-08-18: qty 400

## 2021-08-18 MED ORDER — HEPARIN SODIUM (PORCINE) 5000 UNIT/ML IJ SOLN
5000.0000 [IU] | Freq: Three times a day (TID) | INTRAMUSCULAR | Status: DC
Start: 2021-08-19 — End: 2021-08-25
  Administered 2021-08-19 – 2021-08-25 (×18): 5000 [IU] via SUBCUTANEOUS
  Filled 2021-08-18 (×17): qty 1

## 2021-08-18 MED ORDER — ALBUTEROL SULFATE (2.5 MG/3ML) 0.083% IN NEBU: 2.5000 mg | INHALATION_SOLUTION | Freq: Four times a day (QID) | RESPIRATORY_TRACT | Status: DC | PRN

## 2021-08-18 MED ORDER — SODIUM CHLORIDE 0.9 % IV SOLN
2.0000 g | Freq: Once | INTRAVENOUS | Status: AC
  Administered 2021-08-18: 2 g via INTRAVENOUS
  Filled 2021-08-18: qty 2

## 2021-08-18 MED ORDER — ACETAMINOPHEN 650 MG RE SUPP: 650.0000 mg | Freq: Four times a day (QID) | RECTAL | Status: DC | PRN

## 2021-08-18 MED ORDER — THIAMINE HCL 100 MG PO TABS: 100.0000 mg | ORAL_TABLET | Freq: Every day | ORAL | Status: DC

## 2021-08-18 NOTE — H&P (Addendum)
Date: 08/18/2021               Patient Name:  Leslie [name] MRN: [id number]  DOB: Feb 12, 1946 Age / Sex: 75 y.o., male   [facility]         Medical Service: Internal Medicine Teaching Service         Attending Physician: Dr. Charise Killian, MD    First Contact: Dr. Christiana Fuchs Pager: [phone]  Second Contact: Dr. Sanjuana Letters Pager: [phone]       After Hours (After 5p/  First Contact Pager: [phone]  weekends / holidays): Second Contact Pager: [phone]   Chief Complaint: altered mental status  History of Present Illness: Leslie [name] is a 75 yo male with HTN, HLD, T2DM, COPD, bilateral BKAs, alcohol use disorder, PAD, and a remote history of hepatitis, who presents to [facility] for altered mental status. Patient unable to participate in interview as he remains altered and does not know why he was brought in to the hospital. Per chart review, the patient lives alone and is independent of his ADLs and A&O x3 at his baseline. His neighbor called EMS since he had not heard from the patient in 2 days. When EMS arrived at the patient's home, he was found altered in his bed with a glucose level of 57. EMS gave 25 g of dextrose and glucose increased to 205, but he remained altered. Upon arrival to the ED, he was A&O x1 and not able to answer questions or follow commands. The patient is A&O x2 upon interview, however, he is not cooperative nor is he able to answer many questions. He denies any fevers, chills, chest pain, abd pain, n/v/d, urinary symptoms, or any pain at this time. He states that he is not sick and repeatedly states "no" when discussing his admission to the hospital. He does not know who called EMS for him and is not sure why he is here.   ED course: Leukocytosis of 27.1. Hb 9.2 and platelets 403. CMP significant for Na of 126, BUN/Cr of 77/5.68 (last Cr 1.5 in June, albumin 2.0, alk phos elevated at 168, and total bili of 1.8. Lactic  acid 2.7. Ethanol <10.  CXR with no acute abnormalities. CT scan of head with no acute intracranial process. Xray of left knee shows BKA and mild soft tissue edema.   Meds:  Albuterol q6h PRN Amlodipine 10 mg daily Aspirin 81 mg daily Carbamazepime 200 mg every morning, 300 mg every evening Lorazepam 0.25 mg daily PRN Cymbalta 60 mg daily Lasix 20 mg daily Hydralazine 50 mg TID Semglee 75u daily Losartan 100 mg daily Metformin 850 mg BID Metoprolol 200 mg BID Mirtazapine 7.5 mg qhs Nitro prn Trazodone 50 mg QHS Tegretol BID (nerve pain) Percocet 10-325 mg once daily   No outpatient medications have been marked as taking for the 08/18/21 encounter [facility] Encounter).     Allergies: Allergies as of 08/18/2021 - Review Complete 08/18/2021  Allergen Reaction Noted   Atorvastatin Other (See Comments) 03/26/2018   Statins Other (See Comments) 03/03/2018   Flexeril [cyclobenzaprine] Other (See Comments) 07/23/2012   Past Medical History:  Diagnosis Date   Anxiety    Panic attack - 10  years aago- ? panic    Asthma    Cellulitis of left foot 2016   Healed and recurred 01/2016   Cellulitis of right foot 06/30/2012   Chronic lower back pain 07/01/2012  COPD (chronic obstructive pulmonary disease) (HCC)    Uses nebulizers at home. Former smoker, current chewing tobacco. 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   GERD (gastroesophageal reflux disease)    Gouty arthritis 07/01/2012   Not attacks in ~5 yrs (03/2016)   Hepatitis 1967   "in jail when I got it; dr said it was pretty bad; quaranteened X 30d"   Hyperlipidemia    Hypertension    Lung nodule, solitary 07/2015   Due for 1-yr recheck in 07/2017   Peripheral arterial disease ([place])    critical limb ischemia   Peripheral neuropathy 07/01/2012   Seasonal allergies    Skin growth 07/01/2012   right side of nares; "been on there 20 years"   Substance abuse ([place])    Type II diabetes mellitus ([place]) 2002   Diagnosed in 2002    Family History:  unable to obtain  Social History: Lives alone, reportedly able to complete ADLs independently. Uses motorized power wheelchair. Per chart review, has hx of alcohol use disorder, but patient is unable to quantify alcohol use or last drink.   Review of Systems: A complete ROS was negative except as per HPI.   Physical Exam: Blood pressure (!) 141/59, pulse 86, temperature 99.1 F (37.3 C), temperature source Rectal, resp. rate 20, weight 90.1 kg, SpO2 98 %. Physical Exam Constitutional:      General: He is not in acute distress.    Appearance: He is ill-appearing.  HENT:     Head: Normocephalic and atraumatic.     Mouth/Throat:     Comments: Plaques noted diffusely on tongue. Soft palate with black plaques noted Eyes:     Pupils: Pupils are equal, round, and reactive to light.     Comments: Unable to assess EOM as patient not following commands  Cardiovascular:     Rate and Rhythm: Normal rate and regular rhythm.     Pulses: Normal pulses.     Heart sounds: Normal heart sounds. No murmur heard. Pulmonary:     Effort: Pulmonary effort is normal. No respiratory distress.     Breath sounds: Normal breath sounds. No wheezing, rhonchi or rales.  Abdominal:     General: There is distension.     Palpations: Abdomen is soft.     Tenderness: There is no abdominal tenderness.     Comments: Mild abdominal distension   Musculoskeletal:     Right lower leg: No edema.     Left lower leg: No edema.     Comments: S/p bilateral BKAs Left BKA stump is erythematous to the knee, although not clearly demarcated   Skin:    General: Skin is warm and dry.     Comments: Erythema of left BKA stump  Neurological:     Mental Status: He is alert.     Comments: A&O x2 (person and place) although not aware of situation Not at baseline Unable to assess CN and strength as patient not following commands       EKG: still pending  CXR: personally reviewed my interpretation is no acute  abnormalities  CBC    Component Value Date/Time   WBC 27.1 (H) 08/18/2021 1736   RBC 3.33 (L) 08/18/2021 1736   HGB 8.8 (L) 08/18/2021 2323   HCT 26.0 (L) 08/18/2021 2323   PLT 403 (H) 08/18/2021 1736   MCV 84.7 08/18/2021 1736   MCH 27.6 08/18/2021 1736   MCHC 32.6 08/18/2021 1736   RDW 15.9 (H) 08/18/2021 1736   LYMPHSABS 1.1 08/18/2021 1736  MONOABS 2.0 (H) 08/18/2021 1736   EOSABS 0.4 08/18/2021 1736   BASOSABS 0.1 08/18/2021 1736   BMP Latest Ref Rng & Units 08/18/2021 08/18/2021 08/18/2021  Glucose 70 - 99 mg/dL - 98 100(H)  BUN 8 - 23 mg/dL - 80(H) 77(H)  Creatinine 0.61 - 1.24 mg/dL - 5.70(H) 5.68(H)  Sodium 135 - 145 mmol/L 128(L) 129(L) 126(L)  Potassium 3.5 - 5.1 mmol/L 4.4 4.6 4.4  Chloride 98 - 111 mmol/L - 93(L) 90(L)  CO2 22 - 32 mmol/L - 18(L) 19(L)  Calcium 8.9 - 10.3 mg/dL - 7.8(L) 8.1(L)     Assessment & Plan by Problem: Active Problems:   Altered mental status  #Altered mental status Patient is A&O x2 (self and place), but is not aware of the situation currently. He knows he is in the hospital, but does not know why or who called EMS. Per EMS, patient was found altered in his bed today and was hypoglycemic at 57. CBG improved with dextrose administration, but he remained altered. In the ED, patient is afebrile, but noted to have a leukocytosis of 27.1 (has chronically elevated white count, but higher than baseline). Lactic acid 2.7. CK level within normal limits. CT head with no acute intracranial process. Differential includes, but is not limited to, metabolic etiology (hyponatremic of 126, uremic with BUN 77), infectious etiology (possible cellulitis LLE), hepatic encephalopathy (remote hx of hepatitis and has hx of alcohol use disorder). Seizure is less likely given normal CK and no tongue lacerations noted.  - Blood and urine cultures pending - UDS pending - Follow up repeat lactic acid - Urinalysis pending - AM hepatitis panel, PT-INR, CMP, CBC,  TSH - Abdominal ultrasound  - Continue IV vanc and cefemine - LR 150 cc/hr  #Possible cellulitis left lower extremity Patient has hx of bilateral BKAs. Left lower extremity stump is erythematous to the knee, although not clearly demarcated. Leukocytosis of 27.1 noted on CBC and patient was started on IV vanc, cefepime, and flagyl in the ED. Xray left knee shows no findings of osteomyelitis, but there is mild soft tissue edema noted.  - Continue IV vanc and cefepime  #Acute renal failure Cr 5.68, GFR 10, and BUN of 77 on admission. Per [facility], last Cr was 1.58 in June with a GFR of 48. Bladder scan showed 110 cc of urine. In and out cath was unable to be performed, so a condom catheter was placed.  - Complete abdominal US pending - Continue to monitor renal function - Avoid nephrotoxic agents   #Hyponatremia Na 126 on admission. Unsure of etiology at this time could be secondary to alcohol use vs kidney failure vs pseudohyponatremia. - Follow up lipid panel - Continue to monitor BMP - LR 150 cc/hr  #Anemia Hb 9.2 on admission (last Hb was 10.9 per [facility] physician). No signs of bleeding noted.  - Follow up CBC and iron studies  #Alcohol use disorder Patient unable to quantify alcohol use history nor is he able to state when his last drink was. - CIWA protocol without ativan - Thiamine/folate daily   #Type 2 diabetes #Hypoglcyemia Patient noted to be hypoglycemic at 28 when EMS arrived at his home. Received IV dextrose and glucose increased to 200. Patient had another episode of hypoglycemia in the ED at 60, which improved to 111. The patient is on 75u of Semglee daily at home and metformin 850 mg BID, per [facility]. - CBG q2h - Holding home insulin given hypoglycemia - Follow up A1c  Best practices: Code: Full VTE: heparin Diet: NPO pending SLP eval Fluids: LR 150 cc/hr  PT/OT: recs pending  Dispo: Admit patient to Inpatient with expected length of stay greater than 2  midnights.  SignedDorethea Clan, DO 08/18/2021, 11:23 PM  Pager: [phone] After 5pm on weekdays and 1pm on weekends: On Call pager: [phone]

## 2021-08-18 NOTE — ED Notes (Signed)
ED tech advised difficulty passing an I&O cath for urine specimen. Pt placed on external urinary catheter at this time for collection

## 2021-08-18 NOTE — ED Triage Notes (Signed)
Pt BIB [facility] for altered LOC. EMS was called because pt was not heard drom in 2 days. When EMS arrived they found pt hypoglycemic at 16. EMS admin 25G of dextrose and CBG increased to 205, but pt remained altered.   EMS V/S:  132/93 HR 76 SpOP2 98% on R/A.

## 2021-08-18 NOTE — ED Provider Notes (Addendum)
[facility] EMERGENCY DEPARTMENT Provider Note   CSN: [id number] Arrival date & time: 08/18/21  1722     History Chief Complaint  Patient presents with   Altered Mental Status    Leslie [name] is a 75 y.o. male.  HPI Patient is a 75 year old male with a history of cellulitis, chronic low back pain, COPD, hyperlipidemia, hypertension, hypertension, PAD, substance abuse, alcohol abuse, type 2 diabetes mellitus, who presents to the emergency department due to altered mental status.  Per EMS, patient lives alone and is typically independent and A&O x3.  Has a history of bilateral BKA's.  Neighbor called EMS today due to not hearing from the patient for the past 2 days.  He was found altered in bed.  Patient currently A&O x1 but otherwise not answering questions or following commands.  Appears diaphoretic.  Level 5 caveat due to acuity of condition    Past Medical History:  Diagnosis Date   Anxiety    Panic attack - 10  years aago- ? panic    Asthma    Cellulitis of left foot 2016   Healed and recurred 01/2016   Cellulitis of right foot 06/30/2012   Chronic lower back pain 07/01/2012   COPD (chronic obstructive pulmonary disease) ([place])    Uses nebulizers at home. Former smoker, current chewing tobacco. 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   GERD (gastroesophageal reflux disease)    Gouty arthritis 07/01/2012   Not attacks in ~5 yrs (03/2016)   Hepatitis 1967   "in jail when I got it; dr said it was pretty bad; quaranteened X 30d"   Hyperlipidemia    Hypertension    Lung nodule, solitary 07/2015   Due for 1-yr recheck in 07/2017   Peripheral arterial disease ([place])    critical limb ischemia   Peripheral neuropathy 07/01/2012   Seasonal allergies    Skin growth 07/01/2012   right side of nares; "been on there 20 years"   Substance abuse ([place])    Type II diabetes mellitus ([place]) 2002   Diagnosed in 2002    Patient Active Problem List   Diagnosis Date Noted   Bacteremia    Acute  respiratory distress    AKI (acute kidney injury) ([place])    Sepsis ([place])    Symptomatic anemia    Severe protein-calorie malnutrition ([place])    Wound infection    Dehiscence of amputation stump ([place])    Osteomyelitis ([place]) 03/26/2018   Cellulitis of foot without toes, right 03/03/2018   History of transmetatarsal amputation of right foot ([place]) 02/19/2018   Amputated toe of right foot ([place]) 01/22/2018   Gangrene of right foot (HCC)    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 ([place]) 01/14/2018   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 ([place]) 01/14/2018   S/P bilateral BKA (below knee amputation) ([place]) 04/11/2016   History of tobacco abuse 03/31/2016   Osteomyelitis of left foot ([place])    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   Septic arthritis of left foot ([place]) 03/27/2016   Solitary pulmonary nodule 08/14/2015   Precordial chest pain 08/13/2015   Peripheral arterial disease ([place]) 04/18/2015   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   Right anterior knee pain 11/19/2012   Toenail deformity 01/16/2012   Gout    Diabetic neuropathy ([place]) 03/31/2008   ALCOHOLISM 03/31/2008   Rosacea 03/31/2008   GERD 01/31/2008   COPD (chronic obstructive pulmonary disease) ([place]) 81/44/8185   DM W/COMPLICATION NOS, TYPE II 05/08/2007  OBESITY 05/08/2007   HTN (hypertension) 05/08/2007   HERNIATED [place] 05/08/2007   HISTORY OF ASBESTOS EXPOSURE 05/08/2007   Chronic lower back pain on narcotics  06/16/2005   HLD (hyperlipidemia) 03/22/2002    Past Surgical History:  Procedure Laterality Date   AMPUTATION Left 03/28/2016   Procedure: AMPUTATION left foot 4th and 5 th ray;  Surgeon: Newt Minion, MD;  Location: [place];  Service: Orthopedics;  Laterality: Left;   AMPUTATION Left 04/11/2016   Procedure: LEFT BELOW KNEE AMPUTATION;  Surgeon: Newt Minion, MD;  Location: [place];  Service: Orthopedics;  Laterality: Left;   AMPUTATION Right  01/22/2018   Procedure: RIGHT FOOT 3RD RAY AMPUTATION;  Surgeon: Newt Minion, MD;  Location: [place];  Service: Orthopedics;  Laterality: Right;   AMPUTATION Right 02/19/2018   Procedure: RIGHT TRANSMETATARSAL AMPUTATION;  Surgeon: Newt Minion, MD;  Location: [place];  Service: Orthopedics;  Laterality: Right;   AMPUTATION Right 03/28/2018   Procedure: RIGHT BELOW KNEE AMPUTATION;  Surgeon: Newt Minion, MD;  Location: [place];  Service: Orthopedics;  Laterality: Right;   LEG AMPUTATION BELOW KNEE Left 04/11/2016   LOWER EXTREMITY ANGIOGRAM N/A 04/18/2013   Procedure: LOWER EXTREMITY ANGIOGRAM;  Surgeon: Sherren Mocha, MD;  Location: [facility] CATH LAB;  Service: Cardiovascular;  Laterality: N/A;   LOWER EXTREMITY ANGIOGRAM  08/13/2015   bilateral iliac    PERIPHERAL VASCULAR CATHETERIZATION Bilateral 08/13/2015   Procedure: Lower Extremity Angiography;  Surgeon: Lorretta Harp, MD;  Location: [facility] CV LAB;  Service: Cardiovascular;  Laterality: Bilateral;   PERIPHERAL VASCULAR CATHETERIZATION N/A 08/13/2015   Procedure: Abdominal Aortogram;  Surgeon: Lorretta Harp, MD;  Location: [place] CV LAB;  Service: Cardiovascular;  Laterality: N/A;   TOE AMPUTATION Right 01/22/2018   3RD TOE RIGHT FOOT       Family History  Problem Relation Age of Onset   Diabetes Sister    Hypertension Mother     Social History   Tobacco Use   Smoking status: Former    Packs/day: 2.00    Years: 40.00    Pack years: 80.00    Types: Cigarettes    Quit date: 01/13/2002    Years since quitting: 19.6   Smokeless tobacco: Current    Types: Chew   Tobacco comments:    chews a little tobacco  Vaping Use   Vaping Use: Never used  Substance Use Topics   Alcohol use: Not Currently    Comment: 03/03/18: "Used to drink alot. No alcohol in a long time."   Drug use: No    Types: Marijuana    Comment: 03/27/2016 "last marijuana was maybe in 2013"    Home Medications Prior to Admission medications    Medication Sig Start Date End Date Taking? Authorizing Provider  acetaminophen (TYLENOL) 325 MG tablet Take 2 tablets (650 mg total) by mouth every 6 (six) hours as needed for mild pain (or Fever >/= 101). 03/30/18   Marcelyn Bruins, MD  albuterol (PROVENTIL HFA;VENTOLIN HFA) 108 (90 Base) MCG/ACT inhaler Inhale 2 puffs into the lungs 4 (four) times daily as needed for wheezing or shortness of breath.     [provider]  albuterol (PROVENTIL) (2.5 MG/3ML) 0.083% nebulizer solution Take 2.5 mg by nebulization every 6 (six) hours as needed for wheezing or shortness of breath.    [provider]  allopurinol (ZYLOPRIM) 100 MG tablet Take 100 mg by mouth daily.    [provider]  amLODipine (  NORVASC) 10 MG tablet Take 1 tablet (10 mg total) by mouth daily. 08/09/12   Funches, Adriana Mccallum, MD  antiseptic oral rinse (BIOTENE) LIQD 10 mLs by Mouth Rinse route daily.    [provider]  aspirin 81 MG chewable tablet Chew 81 mg by mouth daily.    [provider]  bisacodyl (DULCOLAX) 10 MG suppository Place 10 mg rectally once as needed (for constipation not relieved by Milk of Magnesia).    [provider]  carbamazepine (TEGRETOL) 200 MG tablet Take 200-300 mg by mouth See admin instructions. Take 1 tablet (200 mg) by mouth every morning and 1 1/2 tablet (300 mg) every evening    [provider]  Cholecalciferol (VITAMIN D-3) 1000 units CAPS Take 1,000 Units by mouth daily.    [provider]  clonazePAM (KLONOPIN) 1 MG tablet Take 1 mg by mouth at bedtime.     [provider]  DULoxetine (CYMBALTA) 60 MG capsule Take 60 mg by mouth daily.    [provider]  furosemide (LASIX) 20 MG tablet Take 20 mg by mouth daily.    [provider]  guaifenesin (GERI-TUSSIN) 100 MG/5ML syrup Take 100 mg by mouth every 4 (four) hours as needed for cough.    [provider]  hydrALAZINE (APRESOLINE) 50 MG tablet  Take 50 mg by mouth 3 (three) times daily.    [provider]  HYDROcodone-acetaminophen (NORCO) 10-325 MG tablet Take 1 tablet by mouth 3 (three) times daily.    [provider]  insulin glargine (LANTUS) 100 UNIT/ML injection Inject 20 Units into the skin daily.     [provider]  losartan (COZAAR) 100 MG tablet Take 100 mg by mouth daily.    [provider]  Magnesium Hydroxide (MILK OF MAGNESIA PO) Take 30 mLs by mouth daily as needed (if no BM in 3 days).    [provider]  Menthol, Topical Analgesic, (BIOFREEZE) 4 % GEL Apply 1 application topically 2 (two) times daily.    [provider]  metFORMIN (GLUCOPHAGE) 1000 MG tablet Take 1 tablet (1,000 mg total) by mouth 2 (two) times daily with a meal. 08/16/15   Eileen Stanford, PA-C  metoprolol succinate (TOPROL-XL) 100 MG 24 hr tablet Take 200 mg by mouth 2 (two) times daily. Take with or immediately following a meal.    [provider]  mirtazapine (REMERON) 7.5 MG tablet Take 7.5 mg by mouth at bedtime.    [provider]  Multiple Vitamins-Minerals (CERTAVITE SENIOR/ANTIOXIDANT) TABS Take 1 tablet by mouth daily.    [provider]  nitroGLYCERIN (NITROSTAT) 0.4 MG SL tablet Place 0.4 mg under the tongue every 5 (five) minutes x 3 doses as needed for chest pain.  11/12/11   Funches, Adriana Mccallum, MD  nystatin (NYSTATIN) powder Apply topically 3 (three) times daily. For jock itch    [provider]  oxyCODONE (OXY IR/ROXICODONE) 5 MG immediate release tablet Take 1-2 tablets (5-10 mg total) by mouth every 4 (four) hours as needed for moderate pain or severe pain. 03/30/18   Marcelyn Bruins, MD  pentoxifylline (TRENTAL) 400 MG CR tablet Take 1 tablet (400 mg total) by mouth 3 (three) times daily with meals. 02/04/18   Newt Minion, MD  pregabalin (LYRICA) 150 MG capsule Take 150 mg by mouth 3 (three) times daily.     [provider]  ranitidine  (ZANTAC) 300 MG tablet Take 300 mg by mouth at bedtime.  [provider]  senna-docusate (SENEXON-S) 8.6-50 MG tablet Take 2 tablets by mouth 2 (two) times daily.    [provider]  Skin Protectants, Misc. (MINERIN) CREA Apply 1 application topically 2 (two) times daily. For dryness    [provider]  Sodium Phosphates (FLEET ENEMA RE) Place 1 each rectally daily as needed (constipation not relieved by Bisacodyl suppository).    [provider]    Allergies    Atorvastatin, Statins, and Flexeril [cyclobenzaprine]  Review of Systems   Review of Systems  Unable to perform ROS: Acuity of condition   Physical Exam Updated Vital Signs BP (!) 136/50   Pulse 86   Temp 99.1 F (37.3 C) (Rectal)   Resp 20   Wt 90.1 kg   SpO2 98%   BMI 32.07 kg/m   Physical Exam Vitals and nursing note reviewed.  Constitutional:      General: He is not in acute distress.    Appearance: Normal appearance. He is diaphoretic. He is not ill-appearing or toxic-appearing.  HENT:     Head: Normocephalic and atraumatic.     Right Ear: External ear normal.     Left Ear: External ear normal.     Nose: Nose normal.     Mouth/Throat:     Mouth: Mucous membranes are moist.     Pharynx: Oropharynx is clear. No oropharyngeal exudate or posterior oropharyngeal erythema.  Eyes:     General: No scleral icterus.       Right eye: No discharge.        Left eye: No discharge.     Conjunctiva/sclera: Conjunctivae normal.     Pupils: Pupils are equal, round, and reactive to light.     Comments: Pupils are, round, and reactive to light.  Unable to assess EOMs due to patient noncompliance.  Cardiovascular:     Rate and Rhythm: Normal rate and regular rhythm.     Pulses: Normal pulses.     Heart sounds: Normal heart sounds. No murmur heard.   No friction rub. No gallop.  Pulmonary:     Effort: Pulmonary effort is normal. No respiratory distress.     Breath sounds: Normal breath  sounds. No stridor. No wheezing, rhonchi or rales.  Abdominal:     General: Abdomen is flat.     Palpations: Abdomen is soft.     Tenderness: There is no abdominal tenderness.     Comments: Protuberant abdomen that is soft.  Patient does not appear to exhibit signs of tenderness with deep palpation.  Musculoskeletal:        General: Tenderness present. Normal range of motion.     Cervical back: Normal range of motion and neck supple. No tenderness.     Comments: Bilateral BKA's.  Mild erythema noted along the left distal leg.  Patient exhibits signs of tenderness with palpation of the region.  Skin:    General: Skin is warm.     Findings: Erythema present.  Neurological:     Comments: A&O x1.  Patient otherwise awake and not following commands.  Psychiatric:        Mood and Affect: Mood normal.        Behavior: Behavior normal.   ED Results / Procedures / Treatments   Labs (all labs ordered are listed, but only abnormal results are displayed) Labs Reviewed  COMPREHENSIVE METABOLIC PANEL - Abnormal; Notable for the following components:      Result Value   Sodium 126 (*)  Chloride 90 (*)    CO2 19 (*)    Glucose, Bld 100 (*)    BUN 77 (*)    Creatinine, Ser 5.68 (*)    Calcium 8.1 (*)    Albumin 2.0 (*)    Alkaline Phosphatase 168 (*)    Total Bilirubin 1.8 (*)    GFR, Estimated 10 (*)    Anion gap 17 (*)    All other components within normal limits  CBC WITH DIFFERENTIAL/PLATELET - Abnormal; Notable for the following components:   WBC 27.1 (*)    RBC 3.33 (*)    Hemoglobin 9.2 (*)    HCT 28.2 (*)    RDW 15.9 (*)    Platelets 403 (*)    Neutro Abs 22.3 (*)    Monocytes Absolute 2.0 (*)    Abs Immature Granulocytes 1.11 (*)    All other components within normal limits  LACTIC ACID, PLASMA - Abnormal; Notable for the following components:   Lactic Acid, Venous 2.7 (*)    All other components within normal limits  PROTIME-INR - Abnormal; Notable for the following  components:   Prothrombin Time 15.6 (*)    All other components within normal limits  APTT - Abnormal; Notable for the following components:   aPTT 51 (*)    All other components within normal limits  CBG MONITORING, ED - Abnormal; Notable for the following components:   Glucose-Capillary 107 (*)    All other components within normal limits  CBG MONITORING, ED - Abnormal; Notable for the following components:   Glucose-Capillary 60 (*)    All other components within normal limits  RESP PANEL BY RT-PCR (FLU A&B, COVID) ARPGX2  CULTURE, BLOOD (ROUTINE X 2)  CULTURE, BLOOD (ROUTINE X 2)  URINE CULTURE  ETHANOL  CK  URINALYSIS, ROUTINE W REFLEX MICROSCOPIC  LACTIC ACID, PLASMA  RAPID URINE DRUG SCREEN, HOSP PERFORMED  CBG MONITORING, ED   EKG None  Radiology CT HEAD WO CONTRAST (5MM)  Result Date: 08/18/2021 CLINICAL DATA:  Delirium, altered mental status. EXAM: CT HEAD WITHOUT CONTRAST TECHNIQUE: Contiguous axial images were obtained from the base of the skull through the vertex without intravenous contrast. COMPARISON:  CT head dated 12/13/2020. FINDINGS: Brain: No evidence of acute infarction, hemorrhage, hydrocephalus, extra-axial collection or mass lesion/mass effect. There is mild cerebral volume loss with associated ex vacuo dilatation. Periventricular white matter hypoattenuation likely represents chronic small vessel ischemic disease. Vascular: There are vascular calcifications in the carotid siphons. Skull: Normal. Negative for fracture or focal lesion. Sinuses/Orbits: There is a small left mastoid effusion. There is mild right ethmoid sinus disease. Other: None. IMPRESSION: No acute intracranial process. Electronically Signed   By: Zerita Boers M.D.   On: 08/18/2021 18:12   DG Chest Portable 1 View  Result Date: 08/18/2021 CLINICAL DATA:  Altered mental status.  Hypoglycemia. EXAM: PORTABLE CHEST 1 VIEW COMPARISON:  Chest radiograph 03/27/2018.  Chest CT 01/03/2019  FINDINGS: Upper normal heart size, stable from prior exam. Unchanged mediastinal contours. Aortic atherosclerosis and tortuosity. Scattered calcified pleural plaques, better demonstrated on prior CT. No acute airspace disease. No pulmonary edema, pleural effusion, or pneumothorax. No acute osseous abnormalities are seen. IMPRESSION: No acute findings. Aortic Atherosclerosis (ICD10-I70.0). Electronically Signed   By: Keith Rake M.D.   On: 08/18/2021 19:15   DG Knee Left Port  Result Date: 08/18/2021 CLINICAL DATA:  Sepsis, possible infection. EXAM: PORTABLE LEFT KNEE - 1-2 VIEW COMPARISON:  Included portions from femur radiograph 08/12/2021 FINDINGS: Below  the knee amputation. The resection margins are smooth. No erosion or periosteal reaction. The knee joint is intact. Minimal degenerative change. No erosion or bony destruction. Advanced vascular calcifications. Mild generalized soft tissue edema. No soft tissue air. Similar density adjacent to the distal femur in the lateral soft tissues, well-defined margins and of doubtful clinical significance. IMPRESSION: 1. Below the knee amputation. No radiographic findings of osteomyelitis. 2. Mild soft tissue edema. Electronically Signed   By: Keith Rake M.D.   On: 08/18/2021 19:34    Procedures .Critical Care Performed by: Rayna Sexton, PA-C Authorized by: Rayna Sexton, PA-C   Critical care provider statement:    Critical care time (minutes):  45   Critical care was necessary to treat or prevent imminent or life-threatening deterioration of the following conditions:  Renal failure and metabolic crisis   Critical care was time spent personally by me on the following activities:  Development of treatment plan with patient or surrogate, discussions with consultants, discussions with primary provider, evaluation of patient's response to treatment, examination of patient, ordering and review of laboratory studies, ordering and performing  treatments and interventions, ordering and review of radiographic studies and re-evaluation of patient's condition   Medications Ordered in ED Medications  lactated ringers infusion (has no administration in time range)  metroNIDAZOLE (FLAGYL) IVPB 500 mg (500 mg Intravenous New Bag/Given 08/18/21 2155)  vancomycin (VANCOREADY) IVPB 2000 mg/400 mL (has no administration in time range)  vancomycin variable dose per unstable renal function (pharmacist dosing) (has no administration in time range)  ceFEPIme (MAXIPIME) 1 g in sodium chloride 0.9 % 100 mL IVPB (has no administration in time range)  dextrose 50 % solution 50 mL (50 mLs Intravenous Given 08/18/21 2200)  ceFEPIme (MAXIPIME) 2 g in sodium chloride 0.9 % 100 mL IVPB (0 g Intravenous Stopped 08/18/21 2044)  sodium chloride 0.9 % bolus 1,000 mL (0 mLs Intravenous Stopped 08/18/21 2152)    ED Course  I have reviewed the triage vital signs and the nursing notes.  Pertinent labs & imaging results that were available during my care of the patient were reviewed by me and considered in my medical decision making (see chart for details).  Clinical Course as of 08/18/21 2213  Sun Aug 18, 2021  1903 CBG rechecked and is downtrending.  Currently is 4.  We will place an order for 1 amp of D50. [LJ]  1957 Comprehensive metabolic panel(!) Appears to be in acute renal failure.  Bladder scan shows 110 cc.  Nursing staff unable to pass I&O. [LJ]  2037 Patient discussed with Dr. Melvia Heaps with nephrology.  Requests that medicine consult nephrology as needed during admission. [LJ]    Clinical Course User Index [LJ] Rayna Sexton, PA-C   MDM Rules/Calculators/A&P                          Pt is a 75 y.o. male who presents to the emergency department from home acutely altered.  Labs: CBC with a white count of 27.1, hemoglobin of 9.2, platelets of 403, neutrophils of 22.3, monocytes of 2, absolute immature granulocytes of 1.11. CMP with a sodium  of 126, chloride of 90, CO2 of 19, glucose of 100, BUN of 77, creatinine of 5.68, calcium of 8.1, albumin of 2, alk phos of 168, total bilirubin of 1.8, GFR of 10, anion gap of 17. Lactic acid 2.7. Ethanol less than 10.  Imaging: X-ray of the left knee shows a below the  knee amputation.  No radiographic findings of osteomyelitis.  Mild soft tissue edema. X-ray of the chest shows no acute findings. CT scan of the head without contrast shows no acute intracranial process.  I, Rayna Sexton, PA-C, personally reviewed and evaluated these images and lab results as part of my medical decision-making.  Lab work with significant leukocytosis of 27.1.  Patient appears to also be in acute renal failure with a creatinine of 5.68 and a GFR of 10.  BUN of 77.  Patient discussed with Dr. Melvia Heaps who is on-call for nephrology.  Agrees with admission and requests that medicine team consult nephrology as needed.  Bladder scan obtained showing 110 cc of urine.  Nursing staff unable to perform an In-N-Out so condom cath was placed.  Physical exam significant for what appears to be a possibly developing cellulitis to the left leg.  Patient is status post bilateral BKA's.  Given this finding as well as his lab work today, patient was started on broad-spectrum antibiotics as well as IV fluids.  Vital signs have been stable since arrival.  Patient will require admission for further management.  Will discuss with the medicine team.  Note: Portions of this report may have been transcribed using voice recognition software. Every effort was made to ensure accuracy; however, inadvertent computerized transcription errors may be present.   Final Clinical Impression(s) / ED Diagnoses Final diagnoses:  Altered mental status, unspecified altered mental status type  Acute renal failure, unspecified acute renal failure type ([place])  Hyponatremia  Cellulitis of left lower extremity  Hypoglycemia   Rx / DC Orders ED Discharge  Orders     None        Rayna Sexton, PA-C 08/18/21 2111    Rayna Sexton, PA-C 08/18/21 2213    Truddie Hidden, MD 08/18/21 2244

## 2021-08-18 NOTE — Progress Notes (Signed)
Pharmacy Antibiotic Note  Leslie [name] is a 75 y.o. male admitted on 08/18/2021 with sepsis.  Pharmacy has been consulted for cefepime and vancomycin dosing.  Patient is a 38 yom with a history of cellulitis, chronic low back pain, COPD, hyperlipidemia, hypertension, hypertension, PAD, substance abuse, alcohol abuse, type 2 diabetes mellitus. Patient presenting with AMS.  Patient's serum creatinine is 5.68 which is significantly above baseline. WBC 27.1; LA 2.7; Afeb  Plan: Cefepime 2g given in ED, will continue Cefepime 1g q24h Vancomycin 2000 mg once, subsequent dosing as indicated per random vancomycin level until renal function stable and/or improved, at which time scheduled dosing can be considered Follow-up cultures and de-escalate antibiotics as appropriate.  Weight: 90.1 kg (198 lb 11.2 oz)  Temp (24hrs), Avg:99 F (37.2 C), Min:98.8 F (37.1 C), Max:99.1 F (37.3 C)  Recent Labs  Lab 08/18/21 1736  WBC 27.1*  CREATININE 5.68*  LATICACIDVEN 2.7*    CrCl cannot be calculated (Unknown ideal weight.).    Allergies  Allergen Reactions   Atorvastatin Other (See Comments)    Unknown reaction   Statins Other (See Comments)    Unknown reaction - on MAR   Flexeril [Cyclobenzaprine] Other (See Comments)    Dry mouth and hallucinations   Antimicrobials this admission: cefepime 10/23 >>  vancomycin 10/23 >>   Microbiology results: Pending  Thank you for allowing pharmacy to be a part of this patient's care.  Lorelei Pont, PharmD, BCPS 08/18/2021 8:47 PM ED Clinical Pharmacist -  [phone]

## 2021-08-19 ENCOUNTER — Encounter (HOSPITAL_COMMUNITY): Payer: Self-pay | Admitting: Internal Medicine

## 2021-08-19 ENCOUNTER — Encounter (HOSPITAL_COMMUNITY)
Admission: EM | Disposition: A | Discharge status: Skilled Nursing Facility | Payer: Self-pay | Source: Home / Self Care | Attending: Internal Medicine

## 2021-08-19 ENCOUNTER — Encounter: Payer: Self-pay | Admitting: Orthopedic Surgery

## 2021-08-19 ENCOUNTER — Inpatient Hospital Stay (HOSPITAL_COMMUNITY): Payer: Medicare (Managed Care) | Admitting: Anesthesiology

## 2021-08-19 ENCOUNTER — Inpatient Hospital Stay (HOSPITAL_COMMUNITY): Payer: Medicare (Managed Care)

## 2021-08-19 DIAGNOSIS — R4182 Altered mental status, unspecified: Secondary | ICD-10-CM | POA: Diagnosis not present

## 2021-08-19 DIAGNOSIS — G9340 Encephalopathy, unspecified: Secondary | ICD-10-CM

## 2021-08-19 DIAGNOSIS — Z9889 Other specified postprocedural states: Secondary | ICD-10-CM

## 2021-08-19 HISTORY — PX: I & D EXTREMITY: SHX5045

## 2021-08-19 LAB — BASIC METABOLIC PANEL
Anion gap: 18 — ABNORMAL HIGH (ref 5–15)
Anion gap: 18 — ABNORMAL HIGH (ref 5–15)
Anion gap: 16 — ABNORMAL HIGH (ref 5–15)
BUN: 80 mg/dL — ABNORMAL HIGH (ref 8–23)
BUN: 80 mg/dL — ABNORMAL HIGH (ref 8–23)
BUN: 80 mg/dL — ABNORMAL HIGH (ref 8–23)
CO2: 18 mmol/L — ABNORMAL LOW (ref 22–32)
CO2: 18 mmol/L — ABNORMAL LOW (ref 22–32)
CO2: 20 mmol/L — ABNORMAL LOW (ref 22–32)
Calcium: 7.8 mg/dL — ABNORMAL LOW (ref 8.9–10.3)
Calcium: 8.1 mg/dL — ABNORMAL LOW (ref 8.9–10.3)
Calcium: 8.2 mg/dL — ABNORMAL LOW (ref 8.9–10.3)
Chloride: 93 mmol/L — ABNORMAL LOW (ref 98–111)
Chloride: 96 mmol/L — ABNORMAL LOW (ref 98–111)
Chloride: 97 mmol/L — ABNORMAL LOW (ref 98–111)
Creatinine, Ser: 5.7 mg/dL — ABNORMAL HIGH (ref 0.61–1.24)
Creatinine, Ser: 5.84 mg/dL — ABNORMAL HIGH (ref 0.61–1.24)
Creatinine, Ser: 5.65 mg/dL — ABNORMAL HIGH (ref 0.61–1.24)
GFR, Estimated: 10 mL/min — ABNORMAL LOW (ref >60)
GFR, Estimated: 9 mL/min — ABNORMAL LOW (ref >60)
GFR, Estimated: 10 mL/min — ABNORMAL LOW (ref >60)
Glucose, Bld: 98 mg/dL (ref 70–99)
Glucose, Bld: 79 mg/dL (ref 70–99)
Glucose, Bld: 129 mg/dL — ABNORMAL HIGH (ref 70–99)
Potassium: 4.6 mmol/L (ref 3.5–5.1)
Potassium: 4.6 mmol/L (ref 3.5–5.1)
Potassium: 4.4 mmol/L (ref 3.5–5.1)
Sodium: 129 mmol/L — ABNORMAL LOW (ref 135–145)
Sodium: 132 mmol/L — ABNORMAL LOW (ref 135–145)
Sodium: 133 mmol/L — ABNORMAL LOW (ref 135–145)

## 2021-08-19 LAB — CBC
HCT: 29.7 % — ABNORMAL LOW (ref 39.0–52.0)
Hemoglobin: 9.6 g/dL — ABNORMAL LOW (ref 13.0–17.0)
MCH: 27.7 pg (ref 26.0–34.0)
MCHC: 32.3 g/dL (ref 30.0–36.0)
MCV: 85.8 fL (ref 80.0–100.0)
Platelets: 453 10*3/uL — ABNORMAL HIGH (ref 150–400)
RBC: 3.46 MIL/uL — ABNORMAL LOW (ref 4.22–5.81)
RDW: 16.4 % — ABNORMAL HIGH (ref 11.5–15.5)
WBC: 29.4 10*3/uL — ABNORMAL HIGH (ref 4.0–10.5)
nRBC: 0.1 % (ref 0.0–0.2)

## 2021-08-19 LAB — CBG MONITORING, ED
Glucose-Capillary: 64 mg/dL — ABNORMAL LOW (ref 70–99)
Glucose-Capillary: 96 mg/dL (ref 70–99)
Glucose-Capillary: 104 mg/dL — ABNORMAL HIGH (ref 70–99)
Glucose-Capillary: 82 mg/dL (ref 70–99)
Glucose-Capillary: 161 mg/dL — ABNORMAL HIGH (ref 70–99)

## 2021-08-19 LAB — LIPID PANEL
Cholesterol: 133 mg/dL (ref 0–200)
HDL: <10 mg/dL — ABNORMAL LOW (ref >40)
Triglycerides: 208 mg/dL — ABNORMAL HIGH (ref <150)
VLDL: 42 mg/dL — ABNORMAL HIGH (ref 0–40)

## 2021-08-19 LAB — URINALYSIS, COMPLETE (UACMP) WITH MICROSCOPIC
Bacteria, UA: NONE SEEN
Bilirubin Urine: NEGATIVE
Glucose, UA: NEGATIVE mg/dL
Ketones, ur: NEGATIVE mg/dL
Leukocytes,Ua: NEGATIVE
Nitrite: NEGATIVE
Protein, ur: NEGATIVE mg/dL
Specific Gravity, Urine: 1.014 (ref 1.005–1.030)
pH: 5 (ref 5.0–8.0)

## 2021-08-19 LAB — TROPONIN I (HIGH SENSITIVITY)
Troponin I (High Sensitivity): 28 ng/L — ABNORMAL HIGH (ref <18)
Troponin I (High Sensitivity): 76 ng/L — ABNORMAL HIGH (ref <18)
Troponin I (High Sensitivity): 86 ng/L — ABNORMAL HIGH (ref <18)
Troponin I (High Sensitivity): 65 ng/L — ABNORMAL HIGH (ref <18)

## 2021-08-19 LAB — IRON AND TIBC
Iron: 21 ug/dL — ABNORMAL LOW (ref 45–182)
Saturation Ratios: 11 % — ABNORMAL LOW (ref 17.9–39.5)
TIBC: 188 ug/dL — ABNORMAL LOW (ref 250–450)
UIBC: 167 ug/dL

## 2021-08-19 LAB — LACTIC ACID, PLASMA
Lactic Acid, Venous: 2.1 mmol/L (ref 0.5–1.9)
Lactic Acid, Venous: 2 mmol/L (ref 0.5–1.9)
Lactic Acid, Venous: 1.7 mmol/L (ref 0.5–1.9)

## 2021-08-19 LAB — COMPREHENSIVE METABOLIC PANEL
ALT: 14 U/L (ref 0–44)
AST: 43 U/L — ABNORMAL HIGH (ref 15–41)
Albumin: 2.2 g/dL — ABNORMAL LOW (ref 3.5–5.0)
Alkaline Phosphatase: 192 U/L — ABNORMAL HIGH (ref 38–126)
Anion gap: 21 — ABNORMAL HIGH (ref 5–15)
BUN: 79 mg/dL — ABNORMAL HIGH (ref 8–23)
CO2: 18 mmol/L — ABNORMAL LOW (ref 22–32)
Calcium: 8.1 mg/dL — ABNORMAL LOW (ref 8.9–10.3)
Chloride: 92 mmol/L — ABNORMAL LOW (ref 98–111)
Creatinine, Ser: 5.94 mg/dL — ABNORMAL HIGH (ref 0.61–1.24)
GFR, Estimated: 9 mL/min — ABNORMAL LOW (ref >60)
Glucose, Bld: 87 mg/dL (ref 70–99)
Potassium: 4.4 mmol/L (ref 3.5–5.1)
Sodium: 131 mmol/L — ABNORMAL LOW (ref 135–145)
Total Bilirubin: 2.3 mg/dL — ABNORMAL HIGH (ref 0.3–1.2)
Total Protein: 7.1 g/dL (ref 6.5–8.1)

## 2021-08-19 LAB — TSH: TSH: 1.371 u[IU]/mL (ref 0.350–4.500)

## 2021-08-19 LAB — HEPATITIS PANEL, ACUTE
HCV Ab: NONREACTIVE
Hep A IgM: NONREACTIVE
Hep B C IgM: NONREACTIVE
Hepatitis B Surface Ag: NONREACTIVE

## 2021-08-19 LAB — GLUCOSE, CAPILLARY
Glucose-Capillary: 142 mg/dL — ABNORMAL HIGH (ref 70–99)
Glucose-Capillary: 110 mg/dL — ABNORMAL HIGH (ref 70–99)
Glucose-Capillary: 128 mg/dL — ABNORMAL HIGH (ref 70–99)

## 2021-08-19 LAB — ACETAMINOPHEN LEVEL: Acetaminophen (Tylenol), Serum: <10 ug/mL — ABNORMAL LOW (ref 10–30)

## 2021-08-19 LAB — OSMOLALITY: Osmolality: 303 mOsm/kg — ABNORMAL HIGH (ref 275–295)

## 2021-08-19 LAB — PROTIME-INR
INR: 1.4 — ABNORMAL HIGH (ref 0.8–1.2)
Prothrombin Time: 16.9 seconds — ABNORMAL HIGH (ref 11.4–15.2)

## 2021-08-19 LAB — SODIUM, URINE, RANDOM: Sodium, Ur: 68 mmol/L

## 2021-08-19 LAB — SALICYLATE LEVEL: Salicylate Lvl: <7 mg/dL — ABNORMAL LOW (ref 7.0–30.0)

## 2021-08-19 LAB — FERRITIN: Ferritin: 241 ng/mL (ref 24–336)

## 2021-08-19 LAB — OSMOLALITY, URINE: Osmolality, Ur: 329 mOsm/kg (ref 300–900)

## 2021-08-19 LAB — APTT: aPTT: 48 seconds — ABNORMAL HIGH (ref 24–36)

## 2021-08-19 LAB — HEMOGLOBIN A1C
Hgb A1c MFr Bld: 5.8 % — ABNORMAL HIGH (ref 4.8–5.6)
Mean Plasma Glucose: 119.76 mg/dL

## 2021-08-19 SURGERY — IRRIGATION AND DEBRIDEMENT EXTREMITY
Anesthesia: General | Site: Knee | Laterality: Left

## 2021-08-19 MED ORDER — LOSARTAN POTASSIUM 50 MG PO TABS
100.0000 mg | ORAL_TABLET | Freq: Every day | ORAL | Status: DC
  Administered 2021-08-19: 100 mg via ORAL

## 2021-08-19 MED ORDER — MINERIN CREME EX CREA
1.0000 "application " | TOPICAL_CREAM | Freq: Two times a day (BID) | CUTANEOUS | Status: DC
  Filled 2021-08-19: qty 113

## 2021-08-19 MED ORDER — HYDRALAZINE HCL 50 MG PO TABS
50.0000 mg | ORAL_TABLET | Freq: Three times a day (TID) | ORAL | Status: DC
  Administered 2021-08-19 – 2021-08-20 (×2): 50 mg via ORAL
  Filled 2021-08-19 (×2): qty 1

## 2021-08-19 MED ORDER — FLEET ENEMA 7-19 GM/118ML RE ENEM
1.0000 | ENEMA | Freq: Every day | RECTAL | Status: DC | PRN
  Administered 2021-08-23: 1 via RECTAL
  Filled 2021-08-19: qty 1

## 2021-08-19 MED ORDER — ALLOPURINOL 100 MG PO TABS
100.0000 mg | ORAL_TABLET | Freq: Every day | ORAL | Status: DC
  Administered 2021-08-19 – 2021-08-20 (×2): 100 mg via ORAL
  Filled 2021-08-19: qty 1

## 2021-08-19 MED ORDER — SODIUM CHLORIDE 0.9 % IR SOLN
Status: DC | PRN
  Administered 2021-08-19: 3000 mL

## 2021-08-19 MED ORDER — 0.9 % SODIUM CHLORIDE (POUR BTL) OPTIME
TOPICAL | Status: DC | PRN
  Administered 2021-08-19: 1000 mL

## 2021-08-19 MED ORDER — SODIUM CHLORIDE 0.9 % IV SOLN: INTRAVENOUS | Status: DC | PRN

## 2021-08-19 MED ORDER — ALBUTEROL SULFATE HFA 108 (90 BASE) MCG/ACT IN AERS: 2.0000 | INHALATION_SPRAY | Freq: Four times a day (QID) | RESPIRATORY_TRACT | Status: DC | PRN

## 2021-08-19 MED ORDER — THIAMINE HCL 100 MG/ML IJ SOLN
100.0000 mg | Freq: Every day | INTRAMUSCULAR | Status: DC
  Administered 2021-08-21 – 2021-08-23 (×3): 100 mg via INTRAVENOUS
  Filled 2021-08-19 (×3): qty 2

## 2021-08-19 MED ORDER — ACETAMINOPHEN 325 MG PO TABS: 325.0000 mg | ORAL_TABLET | Freq: Four times a day (QID) | ORAL | Status: DC | PRN

## 2021-08-19 MED ORDER — FENTANYL CITRATE (PF) 250 MCG/5ML IJ SOLN
INTRAMUSCULAR | Status: DC | PRN
  Administered 2021-08-19: 50 ug via INTRAVENOUS

## 2021-08-19 MED ORDER — CARBAMAZEPINE 200 MG PO TABS
200.0000 mg | ORAL_TABLET | Freq: Every morning | ORAL | Status: DC
  Filled 2021-08-19: qty 1

## 2021-08-19 MED ORDER — LACTATED RINGERS IV SOLN: INTRAVENOUS | Status: AC

## 2021-08-19 MED ORDER — ASPIRIN 81 MG PO CHEW
81.0000 mg | CHEWABLE_TABLET | Freq: Every day | ORAL | Status: DC
  Administered 2021-08-19 – 2021-08-20 (×2): 81 mg via ORAL
  Filled 2021-08-19: qty 1

## 2021-08-19 MED ORDER — CLONAZEPAM 1 MG PO TABS
1.0000 mg | ORAL_TABLET | Freq: Every day | ORAL | Status: DC
  Administered 2021-08-19: 1 mg via ORAL
  Filled 2021-08-19: qty 2

## 2021-08-19 MED ORDER — LORAZEPAM 2 MG/ML IJ SOLN
1.0000 mg | INTRAMUSCULAR | Status: DC | PRN
  Administered 2021-08-19: 4 mg via INTRAVENOUS
  Filled 2021-08-19: qty 2

## 2021-08-19 MED ORDER — SUGAMMADEX SODIUM 200 MG/2ML IV SOLN
INTRAVENOUS | Status: DC | PRN
  Administered 2021-08-19: 200 mg via INTRAVENOUS

## 2021-08-19 MED ORDER — MORPHINE SULFATE (PF) 2 MG/ML IV SOLN: 0.5000 mg | INTRAVENOUS | Status: DC | PRN

## 2021-08-19 MED ORDER — PROPOFOL 10 MG/ML IV BOLUS
INTRAVENOUS | Status: DC | PRN
  Administered 2021-08-19: 90 mg via INTRAVENOUS

## 2021-08-19 MED ORDER — HYDROCODONE-ACETAMINOPHEN 7.5-325 MG PO TABS
1.0000 | ORAL_TABLET | ORAL | Status: DC | PRN
  Administered 2021-08-19: 2 via ORAL
  Filled 2021-08-19: qty 2

## 2021-08-19 MED ORDER — VITAMIN D 25 MCG (1000 UNIT) PO TABS
1000.0000 [IU] | ORAL_TABLET | Freq: Every day | ORAL | Status: DC
  Administered 2021-08-20: 1000 [IU] via ORAL
  Filled 2021-08-19: qty 1

## 2021-08-19 MED ORDER — NITROGLYCERIN 0.4 MG SL SUBL: 0.4000 mg | SUBLINGUAL_TABLET | SUBLINGUAL | Status: DC | PRN

## 2021-08-19 MED ORDER — MIRTAZAPINE 7.5 MG PO TABS
7.5000 mg | ORAL_TABLET | Freq: Every day | ORAL | Status: DC
  Administered 2021-08-19: 7.5 mg via ORAL
  Filled 2021-08-19 (×2): qty 1

## 2021-08-19 MED ORDER — DEXTROSE 50 % IV SOLN
1.0000 | Freq: Once | INTRAVENOUS | Status: AC
  Administered 2021-08-19: 50 mL via INTRAVENOUS
  Filled 2021-08-19: qty 50

## 2021-08-19 MED ORDER — METOPROLOL SUCCINATE ER 200 MG PO TB24
200.0000 mg | ORAL_TABLET | Freq: Two times a day (BID) | ORAL | Status: DC
  Filled 2021-08-19: qty 1

## 2021-08-19 MED ORDER — FENTANYL CITRATE (PF) 250 MCG/5ML IJ SOLN
INTRAMUSCULAR | Status: AC
  Filled 2021-08-19: qty 5

## 2021-08-19 MED ORDER — ADULT MULTIVITAMIN W/MINERALS CH
1.0000 | ORAL_TABLET | Freq: Every day | ORAL | Status: DC
  Administered 2021-08-20: 1 via ORAL
  Filled 2021-08-19: qty 1

## 2021-08-19 MED ORDER — FAMOTIDINE 20 MG PO TABS
40.0000 mg | ORAL_TABLET | Freq: Every day | ORAL | Status: DC
  Administered 2021-08-19 – 2021-08-20 (×2): 40 mg via ORAL
  Filled 2021-08-19: qty 2

## 2021-08-19 MED ORDER — METOCLOPRAMIDE HCL 5 MG PO TABS: 5.0000 mg | ORAL_TABLET | Freq: Three times a day (TID) | ORAL | Status: DC | PRN

## 2021-08-19 MED ORDER — ONDANSETRON HCL 4 MG PO TABS: 4.0000 mg | ORAL_TABLET | Freq: Four times a day (QID) | ORAL | Status: DC | PRN

## 2021-08-19 MED ORDER — ONDANSETRON HCL 4 MG/2ML IJ SOLN
INTRAMUSCULAR | Status: DC | PRN
  Administered 2021-08-19: 4 mg via INTRAVENOUS

## 2021-08-19 MED ORDER — SUCCINYLCHOLINE CHLORIDE 200 MG/10ML IV SOSY
PREFILLED_SYRINGE | INTRAVENOUS | Status: DC | PRN
  Administered 2021-08-19: 120 mg via INTRAVENOUS

## 2021-08-19 MED ORDER — PROPOFOL 10 MG/ML IV BOLUS
INTRAVENOUS | Status: AC
  Filled 2021-08-19: qty 20

## 2021-08-19 MED ORDER — PREGABALIN 75 MG PO CAPS
150.0000 mg | ORAL_CAPSULE | Freq: Three times a day (TID) | ORAL | Status: DC
  Administered 2021-08-19: 150 mg via ORAL
  Filled 2021-08-19: qty 2

## 2021-08-19 MED ORDER — THIAMINE HCL 100 MG PO TABS
100.0000 mg | ORAL_TABLET | Freq: Every day | ORAL | Status: DC
  Administered 2021-08-20: 100 mg via ORAL
  Filled 2021-08-19 (×2): qty 1

## 2021-08-19 MED ORDER — ROCURONIUM BROMIDE 10 MG/ML (PF) SYRINGE
PREFILLED_SYRINGE | INTRAVENOUS | Status: DC | PRN
  Administered 2021-08-19: 20 mg via INTRAVENOUS

## 2021-08-19 MED ORDER — ADULT MULTIVITAMIN W/MINERALS CH: 1.0000 | ORAL_TABLET | Freq: Every day | ORAL | Status: DC

## 2021-08-19 MED ORDER — PENTOXIFYLLINE ER 400 MG PO TBCR
400.0000 mg | EXTENDED_RELEASE_TABLET | Freq: Three times a day (TID) | ORAL | Status: DC
Start: 2021-08-20 — End: 2021-08-20
  Administered 2021-08-19 – 2021-08-20 (×2): 400 mg via ORAL
  Filled 2021-08-19 (×3): qty 1

## 2021-08-19 MED ORDER — BISACODYL 10 MG RE SUPP
10.0000 mg | Freq: Once | RECTAL | Status: DC | PRN
  Administered 2021-08-23: 10 mg via RECTAL
  Filled 2021-08-19: qty 1

## 2021-08-19 MED ORDER — METFORMIN HCL 500 MG PO TABS: 1000.0000 mg | ORAL_TABLET | Freq: Two times a day (BID) | ORAL | Status: DC

## 2021-08-19 MED ORDER — BUPIVACAINE HCL (PF) 0.5 % IJ SOLN
10.0000 mL | Freq: Once | INTRAMUSCULAR | Status: AC
  Administered 2021-08-19: 10 mL
  Filled 2021-08-19: qty 10

## 2021-08-19 MED ORDER — SENNOSIDES-DOCUSATE SODIUM 8.6-50 MG PO TABS
2.0000 | ORAL_TABLET | Freq: Two times a day (BID) | ORAL | Status: DC
  Administered 2021-08-19 – 2021-08-20 (×2): 2 via ORAL
  Filled 2021-08-19 (×2): qty 2

## 2021-08-19 MED ORDER — DULOXETINE HCL 60 MG PO CPEP
60.0000 mg | ORAL_CAPSULE | Freq: Every day | ORAL | Status: DC
  Administered 2021-08-19 – 2021-08-20 (×2): 60 mg via ORAL
  Filled 2021-08-19: qty 1

## 2021-08-19 MED ORDER — LIDOCAINE 2% (20 MG/ML) 5 ML SYRINGE
INTRAMUSCULAR | Status: DC | PRN
Start: 2021-08-19 — End: 2021-08-19
  Administered 2021-08-19: 60 mg via INTRAVENOUS

## 2021-08-19 MED ORDER — FUROSEMIDE 20 MG PO TABS
20.0000 mg | ORAL_TABLET | Freq: Every day | ORAL | Status: DC
  Administered 2021-08-19: 20 mg via ORAL
  Filled 2021-08-19: qty 1

## 2021-08-19 MED ORDER — VITAMIN D3 25 MCG PO TABS
1000.0000 [IU] | ORAL_TABLET | Freq: Every day | ORAL | Status: DC
  Filled 2021-08-19: qty 1

## 2021-08-19 MED ORDER — HYDROCERIN EX CREA
TOPICAL_CREAM | Freq: Two times a day (BID) | CUTANEOUS | Status: DC
  Administered 2021-08-20 – 2021-08-23 (×3): 1 via TOPICAL
  Filled 2021-08-19 (×2): qty 113

## 2021-08-19 MED ORDER — ONDANSETRON HCL 4 MG/2ML IJ SOLN: 4.0000 mg | Freq: Four times a day (QID) | INTRAMUSCULAR | Status: DC | PRN

## 2021-08-19 MED ORDER — LORAZEPAM 1 MG PO TABS: 1.0000 mg | ORAL_TABLET | ORAL | Status: DC | PRN

## 2021-08-19 MED ORDER — FENTANYL CITRATE (PF) 100 MCG/2ML IJ SOLN: 25.0000 ug | INTRAMUSCULAR | Status: DC | PRN

## 2021-08-19 MED ORDER — ALBUTEROL SULFATE (2.5 MG/3ML) 0.083% IN NEBU: 2.5000 mg | INHALATION_SOLUTION | Freq: Four times a day (QID) | RESPIRATORY_TRACT | Status: DC | PRN

## 2021-08-19 MED ORDER — ADULT MULTIVITAMIN W/MINERALS CH
1.0000 | ORAL_TABLET | Freq: Every day | ORAL | Status: DC
  Filled 2021-08-19: qty 1

## 2021-08-19 MED ORDER — METOPROLOL SUCCINATE ER 100 MG PO TB24
200.0000 mg | ORAL_TABLET | Freq: Two times a day (BID) | ORAL | Status: DC
  Administered 2021-08-19: 200 mg via ORAL
  Filled 2021-08-19: qty 2

## 2021-08-19 MED ORDER — CARBAMAZEPINE 200 MG PO TABS
300.0000 mg | ORAL_TABLET | Freq: Every day | ORAL | Status: DC
  Administered 2021-08-19: 300 mg via ORAL
  Filled 2021-08-19: qty 1.5

## 2021-08-19 MED ORDER — METOCLOPRAMIDE HCL 5 MG/ML IJ SOLN: 5.0000 mg | Freq: Three times a day (TID) | INTRAMUSCULAR | Status: DC | PRN

## 2021-08-19 MED ORDER — AMLODIPINE BESYLATE 10 MG PO TABS
10.0000 mg | ORAL_TABLET | Freq: Every day | ORAL | Status: DC
  Administered 2021-08-19 – 2021-08-20 (×2): 10 mg via ORAL
  Filled 2021-08-19: qty 1

## 2021-08-19 MED ORDER — DOCUSATE SODIUM 100 MG PO CAPS
100.0000 mg | ORAL_CAPSULE | Freq: Two times a day (BID) | ORAL | Status: DC
  Administered 2021-08-19 – 2021-08-20 (×2): 100 mg via ORAL
  Filled 2021-08-19 (×2): qty 1

## 2021-08-19 MED ORDER — HYDROCODONE-ACETAMINOPHEN 5-325 MG PO TABS: 1.0000 | ORAL_TABLET | ORAL | Status: DC | PRN

## 2021-08-19 SURGICAL SUPPLY — 47 items
ADH SKN CLS APL DERMABOND .7 (GAUZE/BANDAGES/DRESSINGS) ×1
BAG COUNTER SPONGE SURGICOUNT (BAG) ×2 IMPLANT
BAG SPNG CNTER NS LX DISP (BAG) ×1
BNDG ELASTIC 4X5.8 VLCR STR LF (GAUZE/BANDAGES/DRESSINGS) ×2 IMPLANT
BNDG GAUZE ELAST 4 BULKY (GAUZE/BANDAGES/DRESSINGS) ×1 IMPLANT
CNTNR URN SCR LID CUP LEK RST (MISCELLANEOUS) IMPLANT
CONT SPEC 4OZ STRL OR WHT (MISCELLANEOUS) ×2
CUFF TOURN SGL QUICK 42 (TOURNIQUET CUFF) IMPLANT
DERMABOND ADVANCED (GAUZE/BANDAGES/DRESSINGS) ×1
DERMABOND ADVANCED .7 DNX12 (GAUZE/BANDAGES/DRESSINGS) IMPLANT
DRAPE SURG 17X23 STRL (DRAPES) ×2 IMPLANT
DRAPE U-SHAPE 47X51 STRL (DRAPES) ×2 IMPLANT
DRESSING AQUACEL AG SP 3.5X6 (GAUZE/BANDAGES/DRESSINGS) IMPLANT
DRESSING MEPILEX FLEX 4X4 (GAUZE/BANDAGES/DRESSINGS) IMPLANT
DRSG AQUACEL AG SP 3.5X6 (GAUZE/BANDAGES/DRESSINGS) ×2
DRSG MEPILEX FLEX 4X4 (GAUZE/BANDAGES/DRESSINGS) ×2
ELECT REM PT RETURN 9FT ADLT (ELECTROSURGICAL) ×2
ELECTRODE REM PT RTRN 9FT ADLT (ELECTROSURGICAL) IMPLANT
EVACUATOR 1/8 PVC DRAIN (DRAIN) ×1 IMPLANT
FACESHIELD WRAPAROUND (MASK) ×4 IMPLANT
FACESHIELD WRAPAROUND OR TEAM (MASK) IMPLANT
GAUZE SPONGE 4X4 12PLY STRL (GAUZE/BANDAGES/DRESSINGS) ×1 IMPLANT
GAUZE XEROFORM 1X8 LF (GAUZE/BANDAGES/DRESSINGS) ×1 IMPLANT
GLOVE SURG ENC MOIS LTX SZ6 (GLOVE) ×2 IMPLANT
GLOVE SURG ORTHO LTX SZ7.5 (GLOVE) ×2 IMPLANT
GLOVE SURG ORTHO LTX SZ8 (GLOVE) ×2 IMPLANT
GLOVE SURG UNDER POLY LF SZ6.5 (GLOVE) ×2 IMPLANT
GLOVE SURG UNDER POLY LF SZ7.5 (GLOVE) ×2 IMPLANT
GOWN STRL REUS W/ TWL LRG LVL3 (GOWN DISPOSABLE) ×3 IMPLANT
GOWN STRL REUS W/TWL LRG LVL3 (GOWN DISPOSABLE) ×6
KIT BASIN OR (CUSTOM PROCEDURE TRAY) ×2 IMPLANT
KIT TURNOVER KIT B (KITS) ×2 IMPLANT
MANIFOLD NEPTUNE II (INSTRUMENTS) ×2 IMPLANT
NS IRRIG 1000ML POUR BTL (IV SOLUTION) ×2 IMPLANT
PACK ORTHO EXTREMITY (CUSTOM PROCEDURE TRAY) ×2 IMPLANT
PAD ARMBOARD 7.5X6 YLW CONV (MISCELLANEOUS) ×3 IMPLANT
SPONGE T-LAP 18X18 ~~LOC~~+RFID (SPONGE) ×3 IMPLANT
SUT MNCRL AB 4-0 PS2 18 (SUTURE) ×1 IMPLANT
SUT PDS AB 1 CT1 36 (SUTURE) ×1 IMPLANT
SUT VIC AB 2-0 CT1 27 (SUTURE) ×2
SUT VIC AB 2-0 CT1 TAPERPNT 27 (SUTURE) IMPLANT
TOWEL GREEN STERILE (TOWEL DISPOSABLE) ×2 IMPLANT
TOWEL GREEN STERILE FF (TOWEL DISPOSABLE) ×2 IMPLANT
TUBE CONNECTING 12X1/4 (SUCTIONS) ×2 IMPLANT
UNDERPAD 30X36 HEAVY ABSORB (UNDERPADS AND DIAPERS) ×2 IMPLANT
WATER STERILE IRR 1000ML POUR (IV SOLUTION) ×1 IMPLANT
YANKAUER SUCT BULB TIP NO VENT (SUCTIONS) ×2 IMPLANT

## 2021-08-19 NOTE — H&P (View-Only) (Signed)
Reason for Consult:Left knee pain Referring Physician: Charise Killian Time called: [id number] Time at bedside: Leslie [name] is an 75 y.o. male.  HPI: Leslie [name] was brought to the ED with AMS presumably 2/2 sepsis. He is confused and can't really contribute much to history. He does c/o significant left knee pain when asked.  Past Medical History:  Diagnosis Date   Anxiety    Panic attack - 10  years aago- ? panic    Asthma    Cellulitis of left foot 2016   Healed and recurred 01/2016   Cellulitis of right foot 06/30/2012   Chronic lower back pain 07/01/2012   COPD (chronic obstructive pulmonary disease) ([place])    Uses nebulizers at home. Former smoker, current chewing tobacco. 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   GERD (gastroesophageal reflux disease)    Gouty arthritis 07/01/2012   Not attacks in ~5 yrs (03/2016)   Hepatitis 1967   "in jail when I got it; dr said it was pretty bad; quaranteened X 30d"   Hyperlipidemia    Hypertension    Lung nodule, solitary 07/2015   Due for 1-yr recheck in 07/2017   Peripheral arterial disease ([place])    critical limb ischemia   Peripheral neuropathy 07/01/2012   Seasonal allergies    Skin growth 07/01/2012   right side of nares; "been on there 20 years"   Substance abuse ([place])    Type II diabetes mellitus ([place]) 2002   Diagnosed in 2002    Past Surgical History:  Procedure Laterality Date   AMPUTATION Left 03/28/2016   Procedure: AMPUTATION left foot 4th and 5 th ray;  Surgeon: Newt Minion, MD;  Location: [facility];  Service: Orthopedics;  Laterality: Left;   AMPUTATION Left 04/11/2016   Procedure: LEFT BELOW KNEE AMPUTATION;  Surgeon: Newt Minion, MD;  Location: [facility];  Service: Orthopedics;  Laterality: Left;   AMPUTATION Right 01/22/2018   Procedure: RIGHT FOOT 3RD RAY AMPUTATION;  Surgeon: Newt Minion, MD;  Location: [facility];  Service: Orthopedics;  Laterality: Right;   AMPUTATION Right 02/19/2018   Procedure: RIGHT TRANSMETATARSAL AMPUTATION;  Surgeon: Newt Minion, MD;  Location: [facility];  Service: Orthopedics;  Laterality: Right;   AMPUTATION Right 03/28/2018   Procedure: RIGHT BELOW KNEE AMPUTATION;  Surgeon: Newt Minion, MD;  Location: [facility];  Service: Orthopedics;  Laterality: Right;   LEG AMPUTATION BELOW KNEE Left 04/11/2016   LOWER EXTREMITY ANGIOGRAM N/A 04/18/2013   Procedure: LOWER EXTREMITY ANGIOGRAM;  Surgeon: Sherren Mocha, MD;  Location: [facility] CATH LAB;  Service: Cardiovascular;  Laterality: N/A;   LOWER EXTREMITY ANGIOGRAM  08/13/2015   bilateral iliac    PERIPHERAL VASCULAR CATHETERIZATION Bilateral 08/13/2015   Procedure: Lower Extremity Angiography;  Surgeon: Lorretta Harp, MD;  Location: [facility] CV LAB;  Service: Cardiovascular;  Laterality: Bilateral;   PERIPHERAL VASCULAR CATHETERIZATION N/A 08/13/2015   Procedure: Abdominal Aortogram;  Surgeon: Lorretta Harp, MD;  Location: [facility] CV LAB;  Service: Cardiovascular;  Laterality: N/A;   TOE AMPUTATION Right 01/22/2018   3RD TOE RIGHT FOOT    Family History  Problem Relation Age of Onset   Diabetes Sister    Hypertension Mother     Social History:  reports that he quit smoking about 19 years ago. His smoking use included cigarettes. He has a 80.00 pack-year smoking history. His smokeless tobacco use includes chew. He reports that he does not currently use alcohol. He reports that he does not  use drugs.  Allergies:  Allergies  Allergen Reactions   Atorvastatin Other (See Comments)    Unknown reaction   Statins Other (See Comments)    Unknown reaction - on MAR   Flexeril [Cyclobenzaprine] Other (See Comments)    Dry mouth and hallucinations    Medications: I have reviewed the patient's current medications.  Results for orders placed or performed during the hospital encounter of 08/18/21 (from the past 48 hour(s))  Comprehensive metabolic panel     Status: Abnormal   Collection Time: 08/18/21  5:36 PM  Result Value Ref Range   Sodium 126 (L) 135 - 145 mmol/L    Potassium 4.4 3.5 - 5.1 mmol/L   Chloride 90 (L) 98 - 111 mmol/L   CO2 19 (L) 22 - 32 mmol/L   Glucose, Bld 100 (H) 70 - 99 mg/dL    Comment: Glucose reference range applies only to samples taken after fasting for at least 8 hours.   BUN 77 (H) 8 - 23 mg/dL   Creatinine, Ser 5.68 (H) 0.61 - 1.24 mg/dL   Calcium 8.1 (L) 8.9 - 10.3 mg/dL   Total Protein 6.7 6.5 - 8.1 g/dL   Albumin 2.0 (L) 3.5 - 5.0 g/dL   AST 37 15 - 41 U/L   ALT 14 0 - 44 U/L   Alkaline Phosphatase 168 (H) 38 - 126 U/L   Total Bilirubin 1.8 (H) 0.3 - 1.2 mg/dL   GFR, Estimated 10 (L) >60 mL/min    Comment: (NOTE) Calculated using the CKD-EPI Creatinine Equation (2021)    Anion gap 17 (H) 5 - 15    Comment: Performed at [facility], [place] [address], [place], [place] [address]  CBC with Differential     Status: Abnormal   Collection Time: 08/18/21  5:36 PM  Result Value Ref Range   WBC 27.1 (H) 4.0 - 10.5 K/uL   RBC 3.33 (L) 4.22 - 5.81 MIL/uL   Hemoglobin 9.2 (L) 13.0 - 17.0 g/dL   HCT 28.2 (L) 39.0 - 52.0 %   MCV 84.7 80.0 - 100.0 fL   MCH 27.6 26.0 - 34.0 pg   MCHC 32.6 30.0 - 36.0 g/dL   RDW 15.9 (H) 11.5 - 15.5 %   Platelets 403 (H) 150 - 400 K/uL   nRBC 0.0 0.0 - 0.2 %   Neutrophils Relative % 83 %   Neutro Abs 22.3 (H) 1.7 - 7.7 K/uL   Lymphocytes Relative 4 %   Lymphs Abs 1.1 0.7 - 4.0 K/uL   Monocytes Relative 7 %   Monocytes Absolute 2.0 (H) 0.1 - 1.0 K/uL   Eosinophils Relative 1 %   Eosinophils Absolute 0.4 0.0 - 0.5 K/uL   Basophils Relative 1 %   Basophils Absolute 0.1 0.0 - 0.1 K/uL   Immature Granulocytes 4 %   Abs Immature Granulocytes 1.11 (H) 0.00 - 0.07 K/uL    Comment: Performed at [facility] [address] [place], [place] [address]  Lactic acid, plasma     Status: Abnormal   Collection Time: 08/18/21  5:36 PM  Result Value Ref Range   Lactic Acid, Venous 2.7 (HH) 0.5 - 1.9 mmol/L    Comment: CRITICAL RESULT CALLED TO, READ BACK BY AND VERIFIED WITH: ADAM G RN  BY SSTEPHENS 1948 [id number] Performed at [facility], [place] [address] [place], [place] [address]   Ethanol     Status: None   Collection Time: 08/18/21  5:36  PM  Result Value Ref Range   Alcohol, Ethyl (B) <10 <10 mg/dL    Comment: (NOTE) Lowest detectable limit for serum alcohol is 10 mg/dL.  For medical purposes only. Performed at [facility], [place] [address], [place], [place] [address]   Protime-INR     Status: Abnormal   Collection Time: 08/18/21  5:36 PM  Result Value Ref Range   Prothrombin Time 15.6 (H) 11.4 - 15.2 seconds   INR 1.2 0.8 - 1.2    Comment: (NOTE) INR goal varies based on device and disease states. Performed at [facility], [place] [address] [place], [place] [address]   APTT     Status: Abnormal   Collection Time: 08/18/21  5:36 PM  Result Value Ref Range   aPTT 51 (H) 24 - 36 seconds    Comment:        IF BASELINE aPTT IS ELEVATED, SUGGEST PATIENT RISK ASSESSMENT BE USED TO DETERMINE APPROPRIATE ANTICOAGULANT THERAPY. Performed at [facility], [place] [address], [place], [place] [address]   CK     Status: None   Collection Time: 08/18/21  5:36 PM  Result Value Ref [place]   Total CK 221 49 - 397 U/L    Comment: Performed at [facility], [place] [address], [place], [place] [address]  POC CBG, ED     Status: Abnormal   Collection Time: 08/18/21  5:50 PM  Result Value Ref Range   Glucose-Capillary 107 (H) 70 - 99 mg/dL    Comment: Glucose reference range applies only to samples taken after fasting for at least 8 hours.  CBG monitoring, ED     Status: None   Collection Time: 08/18/21  7:00 PM  Result Value Ref Range   Glucose-Capillary 72 70 - 99 mg/dL    Comment: Glucose reference range applies only to samples taken after fasting for at least 8 hours.  Resp Panel by RT-PCR (Flu A&B, Covid) Nasopharyngeal Swab     Status: None   Collection Time: 08/18/21  7:36 PM   Specimen: Nasopharyngeal Swab; Nasopharyngeal(NP) swabs in  vial transport medium  Result Value Ref Range   SARS Coronavirus 2 by RT PCR NEGATIVE NEGATIVE    Comment: (NOTE) SARS-CoV-2 target nucleic acids are NOT DETECTED.  The SARS-CoV-2 RNA is generally detectable in upper respiratory specimens during the acute phase of infection. The lowest concentration of SARS-CoV-2 viral copies this assay can detect is 138 copies/mL. A negative result does not preclude SARS-Cov-2 infection and should not be used as the sole basis for treatment or other patient management decisions. A negative result may occur with  improper specimen collection/handling, submission of specimen other than nasopharyngeal swab, presence of viral mutation(s) within the areas targeted by this assay, and inadequate number of viral copies(<138 copies/mL). A negative result must be combined with clinical observations, patient history, and epidemiological information. The expected result is Negative.  Fact Sheet for Patients:  EntrepreneurPulse.com.au  Fact Sheet for Healthcare Providers:  IncredibleEmployment.be  This test is no t yet approved or cleared by the [place] FDA and  has been authorized for detection and/or diagnosis of SARS-CoV-2 by FDA under an Emergency Use Authorization (EUA). This EUA will remain  in effect (meaning this test can be used) for the duration of the COVID-19 declaration under Section 564(b)(1) of the Act, 21 U.S.C.section 360bbb-3(b)(1), unless the authorization is terminated  or revoked sooner.       Influenza A by  PCR NEGATIVE NEGATIVE   Influenza B by PCR NEGATIVE NEGATIVE    Comment: (NOTE) The Xpert Xpress SARS-CoV-2/FLU/RSV plus assay is intended as an aid in the diagnosis of influenza from Nasopharyngeal swab specimens and should not be used as a sole basis for treatment. Nasal washings and aspirates are unacceptable for Xpert Xpress SARS-CoV-2/FLU/RSV testing.  Fact Sheet for  Patients: EntrepreneurPulse.com.au  Fact Sheet for Healthcare Providers: IncredibleEmployment.be  This test is not yet approved or cleared by the [place] FDA and has been authorized for detection and/or diagnosis of SARS-CoV-2 by FDA under an Emergency Use Authorization (EUA). This EUA will remain in effect (meaning this test can be used) for the duration of the COVID-19 declaration under Section 564(b)(1) of the Act, 21 U.S.C. section 360bbb-3(b)(1), unless the authorization is terminated or revoked.  Performed at [facility], [place] [address] [place], [place] [address]   Blood Culture (routine x 2)     Status: None (Preliminary result)   Collection Time: 08/18/21  8:10 PM   Specimen: BLOOD  Result Value Ref Range   Specimen Description BLOOD LEFT ARM    Special Requests      BOTTLES DRAWN AEROBIC AND ANAEROBIC Blood Culture adequate volume   Culture      NO GROWTH < 12 HOURS Performed at [facility], [place] [address], [place], [place] [address]    Report Status PENDING   Blood Culture (routine x 2)     Status: None (Preliminary result)   Collection Time: 08/18/21  8:12 PM   Specimen: BLOOD RIGHT HAND  Result Value Ref Range   Specimen Description BLOOD RIGHT HAND    Special Requests      BOTTLES DRAWN AEROBIC AND ANAEROBIC Blood Culture adequate volume   Culture      NO GROWTH < 12 HOURS Performed at [facility], [place] [address], [place], [place] [address]    Report Status PENDING   CBG monitoring, ED     Status: Abnormal   Collection Time: 08/18/21  9:58 PM  Result Value Ref Range   Glucose-Capillary 60 (L) 70 - 99 mg/dL    Comment: Glucose reference range applies only to samples taken after fasting for at least 8 hours.  Lactic acid, plasma     Status: Abnormal   Collection Time: 08/18/21 11:03 PM  Result Value Ref Range   Lactic Acid, Venous 2.1 (HH) 0.5 - 1.9 mmol/L    Comment: CRITICAL VALUE NOTED.   VALUE IS CONSISTENT WITH PREVIOUSLY REPORTED AND CALLED VALUE. Performed at [facility], [place] [address] [place],  [address]   Basic metabolic panel     Status: Abnormal   Collection Time: 08/18/21 11:03 PM  Result Value Ref Range   Sodium 129 (L) 135 - 145 mmol/L   Potassium 4.6 3.5 - 5.1 mmol/L   Chloride 93 (L) 98 - 111 mmol/L   CO2 18 (L) 22 - 32 mmol/L   Glucose, Bld 98 70 - 99 mg/dL    Comment: Glucose reference range applies only to samples taken after fasting for at least 8 hours.   BUN 80 (H) 8 - 23 mg/dL   Creatinine, Ser 5.70 (H) 0.61 - 1.24 mg/dL   Calcium 7.8 (L) 8.9 - 10.3 mg/dL   GFR, Estimated 10 (L) >60 mL/min    Comment: (NOTE) Calculated using the CKD-EPI Creatinine Equation (2021)    Anion gap 18 (H) 5 - 15    Comment:  Performed at [facility], [place] [address], [place], [place] [address]  CBG monitoring, ED     Status: Abnormal   Collection Time: 08/18/21 11:11 PM  Result Value Ref Range   Glucose-Capillary 111 (H) 70 - 99 mg/dL    Comment: Glucose reference range applies only to samples taken after fasting for at least 8 hours.  I-Stat arterial blood gas, ED     Status: Abnormal   Collection Time: 08/18/21 11:23 PM  Result Value Ref Range   pH, Arterial 7.386 7.350 - 7.450   pCO2 arterial 33.1 32.0 - 48.0 mmHg   pO2, Arterial 79 (L) 83.0 - 108.0 mmHg   Bicarbonate 19.8 (L) 20.0 - 28.0 mmol/L   TCO2 21 (L) 22 - 32 mmol/L   O2 Saturation 95.0 %   Acid-base deficit 5.0 (H) 0.0 - 2.0 mmol/L   Sodium 128 (L) 135 - 145 mmol/L   Potassium 4.4 3.5 - 5.1 mmol/L   Calcium, Ion 0.98 (L) 1.15 - 1.40 mmol/L   HCT 26.0 (L) 39.0 - 52.0 %   Hemoglobin 8.8 (L) 13.0 - 17.0 g/dL   Patient temperature 99.1 F    Collection site Radial    Drawn by RT    Sample type ARTERIAL   CBG monitoring, ED     Status: Abnormal   Collection Time: 08/19/21  1:32 AM  Result Value Ref Range   Glucose-Capillary 64 (L) 70 - 99 mg/dL    Comment: Glucose reference  range applies only to samples taken after fasting for at least 8 hours.  Comprehensive metabolic panel     Status: Abnormal   Collection Time: 08/19/21  5:29 AM  Result Value Ref Range   Sodium 131 (L) 135 - 145 mmol/L   Potassium 4.4 3.5 - 5.1 mmol/L   Chloride 92 (L) 98 - 111 mmol/L   CO2 18 (L) 22 - 32 mmol/L   Glucose, Bld 87 70 - 99 mg/dL    Comment: Glucose reference range applies only to samples taken after fasting for at least 8 hours.   BUN 79 (H) 8 - 23 mg/dL   Creatinine, Ser 5.94 (H) 0.61 - 1.24 mg/dL   Calcium 8.1 (L) 8.9 - 10.3 mg/dL   Total Protein 7.1 6.5 - 8.1 g/dL   Albumin 2.2 (L) 3.5 - 5.0 g/dL   AST 43 (H) 15 - 41 U/L   ALT 14 0 - 44 U/L   Alkaline Phosphatase 192 (H) 38 - 126 U/L   Total Bilirubin 2.3 (H) 0.3 - 1.2 mg/dL   GFR, Estimated 9 (L) >60 mL/min    Comment: (NOTE) Calculated using the CKD-EPI Creatinine Equation (2021)    Anion gap 21 (H) 5 - 15    Comment: REPEATED TO VERIFY Performed at [facility], [address] [place], [place] [address]   CBC     Status: Abnormal   Collection Time: 08/19/21  5:29 AM  Result Value Ref Range   WBC 29.4 (H) 4.0 - 10.5 K/uL   RBC 3.46 (L) 4.22 - 5.81 MIL/uL   Hemoglobin 9.6 (L) 13.0 - 17.0 g/dL   HCT 29.7 (L) 39.0 - 52.0 %   MCV 85.8 80.0 - 100.0 fL   MCH 27.7 26.0 - 34.0 pg   MCHC 32.3 30.0 - 36.0 g/dL   RDW 16.4 (H) 11.5 - 15.5 %   Platelets 453 (H) 150 - 400 K/uL   nRBC 0.1 0.0 - 0.2 %  Comment: Performed at [facility], [place] [address], [place], [place] [address]  Protime-INR     Status: Abnormal   Collection Time: 08/19/21  5:29 AM  Result Value Ref Range   Prothrombin Time 16.9 (H) 11.4 - 15.2 seconds   INR 1.4 (H) 0.8 - 1.2    Comment: (NOTE) INR goal varies based on device and disease states. Performed at [facility], [place] [address], [place], [place] [address]   APTT     Status: Abnormal   Collection Time: 08/19/21  5:29 AM  Result Value Ref Range   aPTT 48 (H) 24 -  36 seconds    Comment:        IF BASELINE aPTT IS ELEVATED, SUGGEST PATIENT RISK ASSESSMENT BE USED TO DETERMINE APPROPRIATE ANTICOAGULANT THERAPY. Performed at [facility], [place] [address], [place], [place] [address]   Ferritin     Status: None   Collection Time: 08/19/21  5:29 AM  Result Value Ref Range   Ferritin 241 24 - 336 ng/mL    Comment: Performed at [facility] [address], [place], [place] [address]  Iron and TIBC     Status: Abnormal   Collection Time: 08/19/21  5:29 AM  Result Value Ref Range   Iron 21 (L) 45 - 182 ug/dL   TIBC 188 (L) 250 - 450 ug/dL   Saturation Ratios 11 (L) 17.9 - 39.5 %   UIBC 167 ug/dL    Comment: Performed at [facility], [place] [address], [place], [place] [address]  Hemoglobin A1c     Status: Abnormal   Collection Time: 08/19/21  5:29 AM  Result Value Ref Range   Hgb A1c MFr Bld 5.8 (H) 4.8 - 5.6 %    Comment: (NOTE) Pre diabetes:          5.7%-6.4%  Diabetes:              >6.4%  Glycemic control for   <7.0% adults with diabetes    Mean Plasma Glucose 119.76 mg/dL    Comment: Performed at [facility] [address] [place], [place] [address]  TSH     Status: None   Collection Time: 08/19/21  5:29 AM  Result Value Ref Range   TSH 1.371 0.350 - 4.500 uIU/mL    Comment: Performed by a 3rd Generation assay with a functional sensitivity of <=0.01 uIU/mL. Performed at [facility], [place] [address], [place], [place] [address]   Salicylate level     Status: Abnormal   Collection Time: 08/19/21  5:29 AM  Result Value Ref Range   Salicylate Lvl <8.8 (L) 7.0 - 30.0 mg/dL    Comment: Performed at [facility] [address] [place], [place] [address]  Acetaminophen level     Status: Abnormal   Collection Time: 08/19/21  5:29 AM  Result Value Ref Range   Acetaminophen (Tylenol), Serum <10 (L) 10 - 30 ug/mL    Comment: (NOTE) Therapeutic concentrations vary significantly. A range of 10-30 ug/mL   may be an effective concentration for many patients. However, some  are best treated at concentrations outside of this range. Acetaminophen concentrations >150 ug/mL at 4 hours after ingestion  and >50 ug/mL at 12 hours after ingestion are often associated with  toxic reactions.  Performed at [facility], [place] [address] [place], [place] [address]   Lipid panel     Status:  Abnormal   Collection Time: 08/19/21  5:29 AM  Result Value Ref Range   Cholesterol 133 0 - 200 mg/dL   Triglycerides 208 (H) <150 mg/dL   HDL <10 (L) >40 mg/dL    Comment: REPEATED TO VERIFY   Total CHOL/HDL Ratio NOT CALCULATED RATIO   VLDL 42 (H) 0 - 40 mg/dL   LDL Cholesterol NOT CALCULATED 0 - 99 mg/dL    Comment: Performed at [facility] [address] [place], [place] [address]  CBG monitoring, ED     Status: None   Collection Time: 08/19/21  6:53 AM  Result Value Ref Range   Glucose-Capillary 96 70 - 99 mg/dL    Comment: Glucose reference range applies only to samples taken after fasting for at least 8 hours.  CBG monitoring, ED     Status: Abnormal   Collection Time: 08/19/21  7:57 AM  Result Value Ref Range   Glucose-Capillary 104 (H) 70 - 99 mg/dL    Comment: Glucose reference range applies only to samples taken after fasting for at least 8 hours.  Osmolality     Status: Abnormal   Collection Time: 08/19/21  7:58 AM  Result Value Ref Range   Osmolality 303 (H) 275 - 295 mOsm/kg    Comment: Performed at [facility], [place] [address] [place], [place] [address]  Lactic acid, plasma     Status: Abnormal   Collection Time: 08/19/21  7:58 AM  Result Value Ref Range   Lactic Acid, Venous 2.0 (HH) 0.5 - 1.9 mmol/L    Comment: CRITICAL VALUE NOTED.  VALUE IS CONSISTENT WITH PREVIOUSLY REPORTED AND CALLED VALUE. Performed at [facility], [place] [address] [place], [place] [address]   Troponin I (High Sensitivity)     Status: Abnormal   Collection Time: 08/19/21  7:58 AM   Result Value Ref Range   Troponin I (High Sensitivity) 28 (H) <18 ng/L    Comment: (NOTE) Elevated high sensitivity troponin I (hsTnI) values and significant  changes across serial measurements may suggest ACS but many other  chronic and acute conditions are known to elevate hsTnI results.  Refer to the "Links" section for chest pain algorithms and additional  guidance. Performed at [facility], [place] [address] [place], [place] [address]     CT HEAD WO CONTRAST (5MM)  Result Date: 08/18/2021 CLINICAL DATA:  Delirium, altered mental status. EXAM: CT HEAD WITHOUT CONTRAST TECHNIQUE: Contiguous axial images were obtained from the base of the skull through the vertex without intravenous contrast. COMPARISON:  CT head dated 12/13/2020. FINDINGS: Brain: No evidence of acute infarction, hemorrhage, hydrocephalus, extra-axial collection or mass lesion/mass effect. There is mild cerebral volume loss with associated ex vacuo dilatation. Periventricular white matter hypoattenuation likely represents chronic small vessel ischemic disease. Vascular: There are vascular calcifications in the carotid siphons. Skull: Normal. Negative for fracture or focal lesion. Sinuses/Orbits: There is a small left mastoid effusion. There is mild right ethmoid sinus disease. Other: None. IMPRESSION: No acute intracranial process. Electronically Signed   By: Zerita Boers M.D.   On: 08/18/2021 18:12   US Abdomen Complete  Result Date: 08/19/2021 CLINICAL DATA:  Abdominal pain EXAM: ABDOMEN ULTRASOUND COMPLETE COMPARISON:  None. FINDINGS: Gallbladder: No gallstones or wall thickening visualized. Minimal sludge. No sonographic Murphy sign noted by sonographer. Common bile duct: Diameter: 6 mm Liver: No focal lesion identified. Within normal limits in parenchymal echogenicity. Portal vein is patent on color Doppler imaging  with normal direction of blood flow towards the liver. IVC: No abnormality visualized. Pancreas: Not  visualized due to overlying bowel gas. Spleen: Size and appearance within normal limits. Adjacent splenule. Right Kidney: Length: 9.4 cm. Echogenicity within normal limits. No mass or hydronephrosis visualized. Left Kidney: Length: 11.1 cm. Echogenicity within normal limits. No mass or hydronephrosis visualized. Abdominal aorta: No aneurysm visualized. Other findings: None. IMPRESSION: 1. Minimal sludge within the gallbladder. No evidence of cholecystitis. 2. Otherwise unremarkable ultrasound of the abdomen. Electronically Signed   By: Merilyn Baba M.D.   On: 08/19/2021 00:26   DG Chest Portable 1 View  Result Date: 08/18/2021 CLINICAL DATA:  Altered mental status.  Hypoglycemia. EXAM: PORTABLE CHEST 1 VIEW COMPARISON:  Chest radiograph 03/27/2018.  Chest CT 01/03/2019 FINDINGS: Upper normal heart size, stable from prior exam. Unchanged mediastinal contours. Aortic atherosclerosis and tortuosity. Scattered calcified pleural plaques, better demonstrated on prior CT. No acute airspace disease. No pulmonary edema, pleural effusion, or pneumothorax. No acute osseous abnormalities are seen. IMPRESSION: No acute findings. Aortic Atherosclerosis (ICD10-I70.0). Electronically Signed   By: Keith Rake M.D.   On: 08/18/2021 19:15   DG Knee Left Port  Result Date: 08/18/2021 CLINICAL DATA:  Sepsis, possible infection. EXAM: PORTABLE LEFT KNEE - 1-2 VIEW COMPARISON:  Included portions from femur radiograph 08/12/2021 FINDINGS: Below the knee amputation. The resection margins are smooth. No erosion or periosteal reaction. The knee joint is intact. Minimal degenerative change. No erosion or bony destruction. Advanced vascular calcifications. Mild generalized soft tissue edema. No soft tissue air. Similar density adjacent to the distal femur in the lateral soft tissues, well-defined margins and of doubtful clinical significance. IMPRESSION: 1. Below the knee amputation. No radiographic findings of osteomyelitis. 2.  Mild soft tissue edema. Electronically Signed   By: Keith Rake M.D.   On: 08/18/2021 19:34    Review of Systems  Unable to perform ROS: Mental status change  Musculoskeletal:  Positive for arthralgias (Left knee).  Blood pressure (!) 159/98, pulse 88, temperature 98.9 F (37.2 C), temperature source Oral, resp. rate 10, weight 90.1 kg, SpO2 95 %. Physical Exam Constitutional:      General: He is not in acute distress.    Appearance: He is well-developed. He is not diaphoretic.  HENT:     Head: Normocephalic and atraumatic.  Eyes:     General: No scleral icterus.       Right eye: No discharge.        Left eye: No discharge.     Conjunctiva/sclera: Conjunctivae normal.  Cardiovascular:     Rate and Rhythm: Normal rate and regular rhythm.  Pulmonary:     Effort: Pulmonary effort is normal. No respiratory distress.  Musculoskeletal:     Cervical back: Normal range of motion.     Comments: LLE No traumatic wounds, ecchymosis, or rash  S/p BKA, mild diffuse erythema knee, mod TTP diffusely knee, mod pain with AROM/PROM  Mild knee effusion  Knee stable to varus/ valgus and anterior/posterior stress  Skin:    General: Skin is warm and dry.  Neurological:     Mental Status: He is alert.  Psychiatric:        Mood and Affect: Mood normal.        Behavior: Behavior normal.    Assessment/Plan: Left knee pain -- Will plan on arthrocentesis through only clear window anteriorly. Please keep NPO.    Lisette Abu, PA-C Orthopedic Surgery [phone] 08/19/2021, 9:31 AM

## 2021-08-19 NOTE — ED Notes (Signed)
Troponin result of 76 called to [facility] primary RN

## 2021-08-19 NOTE — Progress Notes (Addendum)
HD#1 SUBJECTIVE:  Patient Summary: Leslie [name] is a 75 y.o. with a pertinent PMH of HTN, HLD, T2DM, COPD, bilateral BKAs, alcohol use disorder, PAD, and a remote history of hepatitis, who presented with altered mental status and admitted for sepsis on hospital day 1.   Overnight Events: none  Interim History:  Patient evaluated at bedside in [place] with altered mental status, he was able to answer who he was and that he was in the hospital. He intermittently answers questions.  Secure chat message received that patient was agitated, had pulled off mitts, remained confused, and was pulling at his IV.  Patient is appropriate for wrist restaints.  Patient stated he has pain all over with significant tenderness to left joint.  He denied chest pain and neck pain.  OBJECTIVE:  Vital Signs: Vitals:   08/18/21 2150 08/18/21 2200 08/18/21 2230 08/19/21 0310  BP: (!) 136/50 (!) 135/55 (!) 141/59 (!) 118/51  Pulse: 86 86 86 95  Resp: 20 15 20 12   Temp:      TempSrc:      SpO2: 98% 94% 98% 100%  Weight:       Supplemental O2: Room Air SpO2: 100 %  Filed Weights   08/18/21 1734  Weight: 90.1 kg     Intake/Output Summary (Last 24 hours) at 08/19/2021 1829 Last data filed at 08/18/2021 2152 Gross per 24 hour  Intake 1100 ml  Output --  Net 1100 ml   Net IO Since Admission: 1,100 mL [08/19/21 0620]  Physical Exam: General: elderly man, hard of hearing, alert, but intermittently answers questions HENT: NCAT, dry mucous membranes Eyes: no scleral icterus, conjunctiva clear CV: no murmurs, normal rate Pulm: CTAB, pulmonary effort normal GI: tender to palpation diffusely, hypoactive bowel sounds, non-distended, soft MSK: bilaterally BKAs, left knee joint is tender to palpation with overlying not well-demarcated erythema and warmth Skin: warm and dry Neuro: oriented to self and place, but not date or situation.  Patient Lines/Drains/Airways Status     Active  Line/Drains/Airways     Name Placement date Placement time Site Days   Peripheral IV 08/18/21 20 G Left;Posterior Wrist 08/18/21  1724  Wrist  1   Negative Pressure Wound Therapy Knee Anterior;Right 03/28/18  0845  --  1240   External Urinary Catheter 08/18/21  1854  --  1   Incision (Closed) 03/28/18 Leg Right 03/28/18  0839  -- 1240            Pertinent Labs: CBC Latest Ref Rng & Units 08/18/2021 08/18/2021 03/28/2018  WBC 4.0 - 10.5 K/uL - 27.1(H) 16.6(H)  Hemoglobin 13.0 - 17.0 g/dL 8.8(L) 9.2(L) 8.2(L)  Hematocrit 39.0 - 52.0 % 26.0(L) 28.2(L) 26.4(L)  Platelets 150 - 400 K/uL - 403(H) 413(H)    CMP Latest Ref Rng & Units 08/18/2021 08/18/2021 08/18/2021  Glucose 70 - 99 mg/dL - 98 100(H)  BUN 8 - 23 mg/dL - 80(H) 77(H)  Creatinine 0.61 - 1.24 mg/dL - 5.70(H) 5.68(H)  Sodium 135 - 145 mmol/L 128(L) 129(L) 126(L)  Potassium 3.5 - 5.1 mmol/L 4.4 4.6 4.4  Chloride 98 - 111 mmol/L - 93(L) 90(L)  CO2 22 - 32 mmol/L - 18(L) 19(L)  Calcium 8.9 - 10.3 mg/dL - 7.8(L) 8.1(L)  Total Protein 6.5 - 8.1 g/dL - - 6.7  Total Bilirubin 0.3 - 1.2 mg/dL - - 1.8(H)  Alkaline Phos 38 - 126 U/L - - 168(H)  AST 15 - 41 U/L - - 37  ALT 0 -  44 U/L - - 14    Recent Labs    08/18/21 2158 08/18/21 2311 08/19/21 0132  GLUCAP 60* 111* 11*     Pertinent Imaging: CT HEAD WO CONTRAST (5MM)  Result Date: 08/18/2021 CLINICAL DATA:  Delirium, altered mental status. EXAM: CT HEAD WITHOUT CONTRAST TECHNIQUE: Contiguous axial images were obtained from the base of the skull through the vertex without intravenous contrast. COMPARISON:  CT head dated 12/13/2020. FINDINGS: Brain: No evidence of acute infarction, hemorrhage, hydrocephalus, extra-axial collection or mass lesion/mass effect. There is mild cerebral volume loss with associated ex vacuo dilatation. Periventricular white matter hypoattenuation likely represents chronic small vessel ischemic disease. Vascular: There are vascular calcifications in  the carotid siphons. Skull: Normal. Negative for fracture or focal lesion. Sinuses/Orbits: There is a small left mastoid effusion. There is mild right ethmoid sinus disease. Other: None. IMPRESSION: No acute intracranial process. Electronically Signed   By: Zerita Boers M.D.   On: 08/18/2021 18:12   US Abdomen Complete  Result Date: 08/19/2021 CLINICAL DATA:  Abdominal pain EXAM: ABDOMEN ULTRASOUND COMPLETE COMPARISON:  None. FINDINGS: Gallbladder: No gallstones or wall thickening visualized. Minimal sludge. No sonographic Murphy sign noted by sonographer. Common bile duct: Diameter: 6 mm Liver: No focal lesion identified. Within normal limits in parenchymal echogenicity. Portal vein is patent on color Doppler imaging with normal direction of blood flow towards the liver. IVC: No abnormality visualized. Pancreas: Not visualized due to overlying bowel gas. Spleen: Size and appearance within normal limits. Adjacent splenule. Right Kidney: Length: 9.4 cm. Echogenicity within normal limits. No mass or hydronephrosis visualized. Left Kidney: Length: 11.1 cm. Echogenicity within normal limits. No mass or hydronephrosis visualized. Abdominal aorta: No aneurysm visualized. Other findings: None. IMPRESSION: 1. Minimal sludge within the gallbladder. No evidence of cholecystitis. 2. Otherwise unremarkable ultrasound of the abdomen. Electronically Signed   By: Merilyn Baba M.D.   On: 08/19/2021 00:26   DG Chest Portable 1 View  Result Date: 08/18/2021 CLINICAL DATA:  Altered mental status.  Hypoglycemia. EXAM: PORTABLE CHEST 1 VIEW COMPARISON:  Chest radiograph 03/27/2018.  Chest CT 01/03/2019 FINDINGS: Upper normal heart size, stable from prior exam. Unchanged mediastinal contours. Aortic atherosclerosis and tortuosity. Scattered calcified pleural plaques, better demonstrated on prior CT. No acute airspace disease. No pulmonary edema, pleural effusion, or pneumothorax. No acute osseous abnormalities are seen.  IMPRESSION: No acute findings. Aortic Atherosclerosis (ICD10-I70.0). Electronically Signed   By: Keith Rake M.D.   On: 08/18/2021 19:15   DG Knee Left Port  Result Date: 08/18/2021 CLINICAL DATA:  Sepsis, possible infection. EXAM: PORTABLE LEFT KNEE - 1-2 VIEW COMPARISON:  Included portions from femur radiograph 08/12/2021 FINDINGS: Below the knee amputation. The resection margins are smooth. No erosion or periosteal reaction. The knee joint is intact. Minimal degenerative change. No erosion or bony destruction. Advanced vascular calcifications. Mild generalized soft tissue edema. No soft tissue air. Similar density adjacent to the distal femur in the lateral soft tissues, well-defined margins and of doubtful clinical significance. IMPRESSION: 1. Below the knee amputation. No radiographic findings of osteomyelitis. 2. Mild soft tissue edema. Electronically Signed   By: Keith Rake M.D.   On: 08/18/2021 19:34    ASSESSMENT/PLAN:  Assessment: Active Problems:   Altered mental status  Leslie [name] is a 75 y.o. with a pertinent PMH of HTN, HLD, T2DM, COPD, bilateral BKAs, alcohol use disorder, PAD, and a remote history of hepatitis, who presented with altered mental status and admitted due to sepsis on hospital day  1.  Plan: Acute encephalopathy Continue to consider metabolic etiology given uremia and hyponatremia, infectious etiology, or medication effect given multiple centrally acting medications. Unclear at this time whether encephalopathy was the precipitant or effect of ongoing conditions as below.    C/f Left knee intraarticular infection Leukocytosis Patient presented to ED secondary to altered mental status.  He has remained afebrile with stable vitals, reassuring he does not have sepsis.  He had been endorsing worsening left stump pain over the last week per [facility].  On exam, left knee with swelling, mild erythema, warmth, and significant TTP  On lab evaluation, lactic acid at  1.7 from 2.7 overnight, leukocytosis trending up to 29.4, UA negative for nitrites and leukocytes, and blood cultures pending.   Orthopedic Surgery consulted and performed arthrocentesis which showed less than 1 mL of purulent discharge from intra-articular space of the left knee with plans to take patient to the OR this afternoon.  We will continue broad-spectrum antibiotics.  -Appreciate assistance from orthopedic surgery, plans for OR this evening -Continue IV Vancomycin and Cefepime, day 1 -Blood and urine cultures pending -Follow-up on synovial cell count, body fluid culture, urinalysis -CBC pending  EKG changes Prolonged Qtc EKG with some T wave inversions and ST depressions, however these are reflected on previous EKGs.  Initial troponin mildly elevated and repeat has continued to trend up, although patient does have severe acute kidney injury.  Will order TTE to evaluate for wall motion abnormalities from ischemia as possible etiology of initial event/alteration.  -TTE pending for ischemic eval -trend troponin, Troponin elevated at 28 to 76, will follow- up on repeat.   -Telemetry monitoring  Acute Kidney Injury Acidosis Oliguria Initial creatinine at 5.7 to 5.94 today (baseline of 1.58 in June with GFR of 48), with BUN in the upper 70s, anion gap trending up to 21, and Renal ultrasound without evidence of obstruction.  Consulted nephrology due to uremia, acidosis, and oliguria. Ua negative for nitrites, negative for protein, leukocytes, with small hgb. Will monitor BMP for improvement in creatinine. Renal US showed kidneys with normal echotexture and no hydronephrosis. Ultrasound did not show signs of obstruction, decreasing likelihood of AKI 2/2 to post renal causes.  His UA did show some RBCs, but not a large amount and negative for protein, meaning less likely AKI due to Glomerulonephritis or glomerulonephrosis.  Likely AKI is secondary to pre renal dehydration, patient appears  dehydrated on physical exam with dry mucous and poor skin turgor.  -Consulted nephrology for potential need for HD -Continue to monitor renal function -LR 150 cc/hr  Hyponatremia On presentation sodium of 126.  On repeat, this is improving with fluid resuscitation.  Osmolality of 303, consistent with hypertonic hyponatremia. However this does not fit with patient's clinical picture. Negative osmol gap. Will follow-up on Sodium, urine.  -BMP q 4 hours -Goal correction of 6-8/ 24 hour, unclear if this is acute verus chronic. -Sodium, urine pending -Osmolality, urine pending  Normocytic anemia Hemoglobin of 9.6 with MCV of 85.8. Iron studies reflecting iron deficiency and anemia of chronic disease. Ganzoni equation calculation of 1019 mg iron deficiency. Patient currently has infective process of left knee joint, will consider iron supplementation once infection resolves.  -Monitor CBC  Alcohol use disorder Patient unable to quantify alcohol use history nor is he able to state when his last drink was. Currently trying to limit sedating medications, at home patient is on lyrica and Carbamazepine 200 mg AM and 300 mg PM.    -[place]  protocol without ativan -Holding sedating medications, including home medications of lyrica and carbamazepine. -thiamine 469 mg -Folic acid 1 mg -Rapid drug screen pending  Type 2 diabetes with peripheral neuropathy Hypoglycemia Patient hypoglycemic at 60 when EMS arrived at his home.  He had a second episode of hypoglycemia in the ED at 60, which improved to 111.  At home patient is on 75 units of Semglee daily and metformin 850 mg twice daily, presents case. A1c of 5.8.   -CBGs q 2 hours -Continue holding home insulin given hypoglycemia -holding carbamazepime due to sedating effects   Best Practice: Diet: NPO due to altered mental status IVF: Fluids: LR, Rate:  150 cc/hr x 20 hrs VTE: heparin injection 5,000 Units Start: 08/19/21 1400 Code: Full AB:  vancomycin, cefepime DISPO: Anticipated discharge pending Medical stability.  Signature: Christiana Fuchs, D.O. Internal Medicine Resident, PGY-1 [facility]  Pager: [phone] 6:20 AM, 08/19/2021   Please contact the on call pager after 5 pm and on weekends at [phone].

## 2021-08-19 NOTE — Consult Note (Signed)
[facility] Renal Consultation Note  Requesting MD: Saverio Danker Indication for Consultation: AKI  HPI:  Leslie [name] is a 75 y.o. male with type 2 DM, HTN, COPD, PAD s/p bilat BKA's, ETOH use disorder who is brought to the ER from home-  neighbor had not seen him for a couple of days. EMS found pt in bed-  altered-  sugar was low at 57.  He has not been able to provide any history since being here in the ED. Initially - he was combative and coming out of restraints.  Given ativan and is now sedated, calm but unable to answer questions.  Regarding renal function -  reportedly he had a crt of 1.5 in June but I have not confirmed that information-  last crt in the system here was from 2019 and was normal.  He is noted to be on losartan as OP.  His presenting crt here was 5.68-  up to 5.94 today.  He is getting fluids-  has around 200 of amber colored urine in his canister from a condom cath.   Renal u/s shows 9-11 cm kidneys normal echotexture and no hydro.  Urine negative for protein or blood , CK is 221, lactate is 2- WBC 70W- salicylate and tylenol levels are low  Creat  Date/Time Value Ref Range Status  08/09/2012 09:12 AM 0.73 0.50 - 1.35 mg/dL Final  07/06/2012 11:20 AM 0.85 0.50 - 1.35 mg/dL Final  11/12/2011 04:01 PM 1.01 0.50 - 1.35 mg/dL Final   Creatinine, Ser  Date/Time Value Ref Range Status  08/19/2021 05:29 AM 5.94 (H) 0.61 - 1.24 mg/dL Final  08/18/2021 11:03 PM 5.70 (H) 0.61 - 1.24 mg/dL Final  08/18/2021 05:36 PM 5.68 (H) 0.61 - 1.24 mg/dL Final  03/30/2018 03:46 AM 0.89 0.61 - 1.24 mg/dL Final  03/29/2018 06:54 AM 1.01 0.61 - 1.24 mg/dL Final  03/28/2018 04:29 AM 1.42 (H) 0.61 - 1.24 mg/dL Final  03/27/2018 03:20 AM 1.58 (H) 0.61 - 1.24 mg/dL Final  03/26/2018 01:24 PM 2.13 (H) 0.61 - 1.24 mg/dL Final  03/04/2018 04:15 AM 1.06 0.61 - 1.24 mg/dL Final  03/03/2018 04:16 PM 1.20 0.61 - 1.24 mg/dL Final  02/19/2018 07:33 AM 1.02 0.61 - 1.24 mg/dL Final  01/22/2018  07:01 AM 0.98 0.61 - 1.24 mg/dL Final  04/11/2016 10:58 AM 0.91 0.61 - 1.24 mg/dL Final  03/29/2016 06:31 AM 0.96 0.61 - 1.24 mg/dL Final  03/28/2016 02:51 AM 1.02 0.61 - 1.24 mg/dL Final  03/27/2016 11:13 AM 0.90 0.61 - 1.24 mg/dL Final  08/14/2015 05:07 AM 0.88 0.61 - 1.24 mg/dL Final  08/13/2015 11:18 AM 0.76 0.61 - 1.24 mg/dL Final  04/05/2013 12:45 PM 1.1 0.4 - 1.5 mg/dL Final  07/04/2012 05:25 AM 0.81 0.50 - 1.35 mg/dL Final  07/03/2012 06:20 AM 0.76 0.50 - 1.35 mg/dL Final  07/02/2012 06:30 AM 0.85 0.50 - 1.35 mg/dL Final  07/01/2012 07:10 AM 0.92 0.50 - 1.35 mg/dL Final  06/30/2012 05:27 PM 0.99 0.50 - 1.35 mg/dL Final  05/06/2011 05:15 AM 0.94 0.50 - 1.35 mg/dL Final    Comment:    **Please note change in reference range.**  05/01/2011 08:15 PM 0.81 0.50 - 1.35 mg/dL Final    Comment:    **Please note change in reference range.**  09/17/2010 08:45 PM 0.85 0.40 - 1.50 mg/dL Final  05/30/2009 08:54 PM 0.74 0.40 - 1.50 mg/dL Final  01/20/2008 10:25 PM 0.85 0.40 - 1.50 mg/dL Final     PMHx:   Past  Medical History:  Diagnosis Date   Anxiety    Panic attack - 10  years aago- ? panic    Asthma    Cellulitis of left foot 2016   Healed and recurred 01/2016   Cellulitis of right foot 06/30/2012   Chronic lower back pain 07/01/2012   COPD (chronic obstructive pulmonary disease) ([place])    Uses nebulizers at home. Former smoker, current chewing tobacco. 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   GERD (gastroesophageal reflux disease)    Gouty arthritis 07/01/2012   Not attacks in ~5 yrs (03/2016)   Hepatitis 1967   "in jail when I got it; dr said it was pretty bad; quaranteened X 30d"   Hyperlipidemia    Hypertension    Lung nodule, solitary 07/2015   Due for 1-yr recheck in 07/2017   Peripheral arterial disease ([place])    critical limb ischemia   Peripheral neuropathy 07/01/2012   Seasonal allergies    Skin growth 07/01/2012   right side of nares; "been on there 20 years"   Substance abuse ([place])    Type  II diabetes mellitus ([place]) 2002   Diagnosed in 2002    Past Surgical History:  Procedure Laterality Date   AMPUTATION Left 03/28/2016   Procedure: AMPUTATION left foot 4th and 5 th ray;  Surgeon: Newt Minion, MD;  Location: [facility];  Service: Orthopedics;  Laterality: Left;   AMPUTATION Left 04/11/2016   Procedure: LEFT BELOW KNEE AMPUTATION;  Surgeon: Newt Minion, MD;  Location: [facility];  Service: Orthopedics;  Laterality: Left;   AMPUTATION Right 01/22/2018   Procedure: RIGHT FOOT 3RD RAY AMPUTATION;  Surgeon: Newt Minion, MD;  Location: [facility];  Service: Orthopedics;  Laterality: Right;   AMPUTATION Right 02/19/2018   Procedure: RIGHT TRANSMETATARSAL AMPUTATION;  Surgeon: Newt Minion, MD;  Location: [facility];  Service: Orthopedics;  Laterality: Right;   AMPUTATION Right 03/28/2018   Procedure: RIGHT BELOW KNEE AMPUTATION;  Surgeon: Newt Minion, MD;  Location: [facility];  Service: Orthopedics;  Laterality: Right;   LEG AMPUTATION BELOW KNEE Left 04/11/2016   LOWER EXTREMITY ANGIOGRAM N/A 04/18/2013   Procedure: LOWER EXTREMITY ANGIOGRAM;  Surgeon: Sherren Mocha, MD;  Location: [facility];  Service: Cardiovascular;  Laterality: N/A;   LOWER EXTREMITY ANGIOGRAM  08/13/2015   bilateral iliac    PERIPHERAL VASCULAR CATHETERIZATION Bilateral 08/13/2015   Procedure: Lower Extremity Angiography;  Surgeon: Lorretta Harp, MD;  Location: [facility];  Service: Cardiovascular;  Laterality: Bilateral;   PERIPHERAL VASCULAR CATHETERIZATION N/A 08/13/2015   Procedure: Abdominal Aortogram;  Surgeon: Lorretta Harp, MD;  Location: [facility];  Service: Cardiovascular;  Laterality: N/A;   TOE AMPUTATION Right 01/22/2018   3RD TOE RIGHT FOOT    Family Hx:  Family History  Problem Relation Age of Onset   Diabetes Sister    Hypertension Mother     Social History:  reports that he quit smoking about 19 years ago. His smoking use included cigarettes. He has a 80.00 pack-year smoking  history. His smokeless tobacco use includes chew. He reports that he does not currently use alcohol. He reports that he does not use drugs.  Allergies:  Allergies  Allergen Reactions   Atorvastatin Other (See Comments)    Unknown reaction   Statins Other (See Comments)    Unknown reaction - on MAR   Flexeril [Cyclobenzaprine] Other (See Comments)    Dry mouth and hallucinations    Medications: Prior to Admission medications  Medication Sig Start Date End Date Taking? Authorizing Provider  acetaminophen (TYLENOL) 325 MG tablet Take 2 tablets (650 mg total) by mouth every 6 (six) hours as needed for mild pain (or Fever >/= 101). 03/30/18   Marcelyn Bruins, MD  albuterol (PROVENTIL HFA;VENTOLIN HFA) 108 (90 Base) MCG/ACT inhaler Inhale 2 puffs into the lungs 4 (four) times daily as needed for wheezing or shortness of breath.     [provider]  albuterol (PROVENTIL) (2.5 MG/3ML) 0.083% nebulizer solution Take 2.5 mg by nebulization every 6 (six) hours as needed for wheezing or shortness of breath.    [provider]  allopurinol (ZYLOPRIM) 100 MG tablet Take 100 mg by mouth daily.    [provider]  amLODipine (NORVASC) 10 MG tablet Take 1 tablet (10 mg total) by mouth daily. 08/09/12   Funches, Adriana Mccallum, MD  antiseptic oral rinse (BIOTENE) LIQD 10 mLs by Mouth Rinse route daily.    [provider]  aspirin 81 MG chewable tablet Chew 81 mg by mouth daily.    [provider]  bisacodyl (DULCOLAX) 10 MG suppository Place 10 mg rectally once as needed (for constipation not relieved by Milk of Magnesia).    [provider]  carbamazepine (TEGRETOL) 200 MG tablet Take 200-300 mg by mouth See admin instructions. Take 1 tablet (200 mg) by mouth every morning and 1 1/2 tablet (300 mg) every evening    [provider]  Cholecalciferol (VITAMIN D-3) 1000 units CAPS Take 1,000 Units by mouth daily.    [provider]  clonazePAM  (KLONOPIN) 1 MG tablet Take 1 mg by mouth at bedtime.     [provider]  DULoxetine (CYMBALTA) 60 MG capsule Take 60 mg by mouth daily.    [provider]  furosemide (LASIX) 20 MG tablet Take 20 mg by mouth daily.    [provider]  guaifenesin (GERI-TUSSIN) 100 MG/5ML syrup Take 100 mg by mouth every 4 (four) hours as needed for cough.    [provider]  hydrALAZINE (APRESOLINE) 50 MG tablet Take 50 mg by mouth 3 (three) times daily.    [provider]  HYDROcodone-acetaminophen (NORCO) 10-325 MG tablet Take 1 tablet by mouth 3 (three) times daily.    [provider]  insulin glargine (LANTUS) 100 UNIT/ML injection Inject 20 Units into the skin daily.     [provider]  losartan (COZAAR) 100 MG tablet Take 100 mg by mouth daily.    [provider]  Magnesium Hydroxide (MILK OF MAGNESIA PO) Take 30 mLs by mouth daily as needed (if no BM in 3 days).    [provider]  Menthol, Topical Analgesic, (BIOFREEZE) 4 % GEL Apply 1 application topically 2 (two) times daily.    [provider]  metFORMIN (GLUCOPHAGE) 1000 MG tablet Take 1 tablet (1,000 mg total) by mouth 2 (two) times daily with a meal. 08/16/15   Eileen [place], PA-C  metoprolol succinate (TOPROL-XL) 100 MG 24 hr tablet Take 200 mg by mouth 2 (two) times daily. Take with or immediately following a meal.    [provider]  mirtazapine (REMERON) 7.5 MG tablet Take 7.5 mg by mouth at bedtime.    [provider]  Multiple Vitamins-Minerals (CERTAVITE SENIOR/ANTIOXIDANT) TABS Take 1 tablet by mouth daily.    [provider]  nitroGLYCERIN (NITROSTAT) 0.4 MG SL tablet Place 0.4 mg under the tongue every 5 (five) minutes x 3 doses as needed for chest  pain.  11/12/11   Funches, Adriana Mccallum, MD  nystatin (NYSTATIN) powder Apply topically 3 (three) times daily. For jock itch    [provider]  oxyCODONE (OXY  IR/ROXICODONE) 5 MG immediate release tablet Take 1-2 tablets (5-10 mg total) by mouth every 4 (four) hours as needed for moderate pain or severe pain. 03/30/18   Marcelyn Bruins, MD  pentoxifylline (TRENTAL) 400 MG CR tablet Take 1 tablet (400 mg total) by mouth 3 (three) times daily with meals. 02/04/18   Newt Minion, MD  pregabalin (LYRICA) 150 MG capsule Take 150 mg by mouth 3 (three) times daily.     [provider]  ranitidine (ZANTAC) 300 MG tablet Take 300 mg by mouth at bedtime.    [provider]  senna-docusate (SENEXON-S) 8.6-50 MG tablet Take 2 tablets by mouth 2 (two) times daily.    [provider]  Skin Protectants, Misc. (MINERIN) CREA Apply 1 application topically 2 (two) times daily. For dryness    [provider]  Sodium Phosphates (FLEET ENEMA RE) Place 1 each rectally daily as needed (constipation not relieved by Bisacodyl suppository).    [provider]    I have reviewed the patient's current medications.  Labs:  Results for orders placed or performed during the hospital encounter of 08/18/21 (from the past 48 hour(s))  Comprehensive metabolic panel     Status: Abnormal   Collection Time: 08/18/21  5:36 PM  Result Value Ref Range   Sodium 126 (L) 135 - 145 mmol/L   Potassium 4.4 3.5 - 5.1 mmol/L   Chloride 90 (L) 98 - 111 mmol/L   CO2 19 (L) 22 - 32 mmol/L   Glucose, Bld 100 (H) 70 - 99 mg/dL    Comment: Glucose reference range applies only to samples taken after fasting for at least 8 hours.   BUN 77 (H) 8 - 23 mg/dL   Creatinine, Ser 5.68 (H) 0.61 - 1.24 mg/dL   Calcium 8.1 (L) 8.9 - 10.3 mg/dL   Total Protein 6.7 6.5 - 8.1 g/dL   Albumin 2.0 (L) 3.5 - 5.0 g/dL   AST 37 15 - 41 U/L   ALT 14 0 - 44 U/L   Alkaline Phosphatase 168 (H) 38 - 126 U/L   Total Bilirubin 1.8 (H) 0.3 - 1.2 mg/dL   GFR, Estimated 10 (L) >60 mL/min    Comment: (NOTE) Calculated using the CKD-EPI Creatinine Equation (2021)    Anion gap  17 (H) 5 - 15    Comment: Performed at [facility], [place] [address] [place], [place] [address]  CBC with Differential     Status: Abnormal   Collection Time: 08/18/21  5:36 PM  Result Value Ref Range   WBC 27.1 (H) 4.0 - 10.5 K/uL   RBC 3.33 (L) 4.22 - 5.81 MIL/uL   Hemoglobin 9.2 (L) 13.0 - 17.0 g/dL   HCT 28.2 (L) 39.0 - 52.0 %   MCV 84.7 80.0 - 100.0 fL   MCH 27.6 26.0 - 34.0 pg   MCHC 32.6 30.0 - 36.0 g/dL   RDW 15.9 (H) 11.5 - 15.5 %   Platelets 403 (H) 150 - 400 K/uL   nRBC 0.0 0.0 - 0.2 %   Neutrophils Relative % 83 %   Neutro Abs 22.3 (H) 1.7 - 7.7 K/uL   Lymphocytes Relative 4 %   Lymphs Abs 1.1 0.7 - 4.0 K/uL   Monocytes Relative 7 %   Monocytes Absolute 2.0 (  H) 0.1 - 1.0 K/uL   Eosinophils Relative 1 %   Eosinophils Absolute 0.4 0.0 - 0.5 K/uL   Basophils Relative 1 %   Basophils Absolute 0.1 0.0 - 0.1 K/uL   Immature Granulocytes 4 %   Abs Immature Granulocytes 1.11 (H) 0.00 - 0.07 K/uL    Comment: Performed at [facility] [address] [place], [place] [address]  Lactic acid, plasma     Status: Abnormal   Collection Time: 08/18/21  5:36 PM  Result Value Ref Range   Lactic Acid, Venous 2.7 (HH) 0.5 - 1.9 mmol/L    Comment: CRITICAL RESULT CALLED TO, READ BACK BY AND VERIFIED WITH: ADAM G RN BY SSTEPHENS 1947 [id number] Performed at [facility], [place] [address] [place], [place] [address]   Ethanol     Status: None   Collection Time: 08/18/21  5:36 PM  Result Value Ref Range   Alcohol, Ethyl (B) <10 <10 mg/dL    Comment: (NOTE) Lowest detectable limit for serum alcohol is 10 mg/dL.  For medical purposes only. Performed at [facility], [place] [address], [place], [place] [address]   Protime-INR     Status: Abnormal   Collection Time: 08/18/21  5:36 PM  Result Value Ref Range   Prothrombin Time 15.6 (H) 11.4 - 15.2 seconds   INR 1.2 0.8 - 1.2    Comment: (NOTE) INR goal varies based on device and disease states. Performed at [facility], [place] [address] [place], [place] [address]   APTT     Status: Abnormal   Collection Time: 08/18/21  5:36 PM  Result Value Ref Range   aPTT 51 (H) 24 - 36 seconds    Comment:        IF BASELINE aPTT IS ELEVATED, SUGGEST PATIENT RISK ASSESSMENT BE USED TO DETERMINE APPROPRIATE ANTICOAGULANT THERAPY. Performed at [facility], [place] [address] [place], [place] [address]   CK     Status: None   Collection Time: 08/18/21  5:36 PM  Result Value Ref Range   Total CK 221 49 - 397 U/L    Comment: Performed at [facility], [place] [address] [place], [place] [address]  POC CBG, ED     Status: Abnormal   Collection Time: 08/18/21  5:50 PM  Result Value Ref Range   Glucose-Capillary 107 (H) 70 - 99 mg/dL    Comment: Glucose reference range applies only to samples taken after fasting for at least 8 hours.  CBG monitoring, ED     Status: None   Collection Time: 08/18/21  7:00 PM  Result Value Ref Range   Glucose-Capillary 72 70 - 99 mg/dL    Comment: Glucose reference range applies only to samples taken after fasting for at least 8 hours.  Resp Panel by RT-PCR (Flu A&B, Covid) Nasopharyngeal Swab     Status: None   Collection Time: 08/18/21  7:36 PM   Specimen: Nasopharyngeal Swab; Nasopharyngeal(NP) swabs in vial transport medium  Result Value Ref Range   SARS Coronavirus 2 by RT PCR NEGATIVE NEGATIVE    Comment: (NOTE) SARS-CoV-2 target nucleic acids are NOT DETECTED.  The SARS-CoV-2 RNA is generally detectable in upper respiratory specimens during the acute phase of infection. The lowest concentration of SARS-CoV-2 viral copies this assay can detect is 138 copies/mL. A negative result does not preclude SARS-Cov-2 infection and should not be used as the sole basis for treatment or other  patient management decisions. A negative result may occur with  improper specimen collection/handling, submission of specimen other than nasopharyngeal swab, presence of viral  mutation(s) within the areas targeted by this assay, and inadequate number of viral copies(<138 copies/mL). A negative result must be combined with clinical observations, patient history, and epidemiological information. The expected result is Negative.  Fact Sheet for Patients:  EntrepreneurPulse.com.au  Fact Sheet for Healthcare Providers:  IncredibleEmployment.be  This test is no t yet approved or cleared by the [place] FDA and  has been authorized for detection and/or diagnosis of SARS-CoV-2 by FDA under an Emergency Use Authorization (EUA). This EUA will remain  in effect (meaning this test can be used) for the duration of the COVID-19 declaration under Section 564(b)(1) of the Act, 21 U.S.C.section 360bbb-3(b)(1), unless the authorization is terminated  or revoked sooner.       Influenza A by PCR NEGATIVE NEGATIVE   Influenza B by PCR NEGATIVE NEGATIVE    Comment: (NOTE) The Xpert Xpress SARS-CoV-2/FLU/RSV plus assay is intended as an aid in the diagnosis of influenza from Nasopharyngeal swab specimens and should not be used as a sole basis for treatment. Nasal washings and aspirates are unacceptable for Xpert Xpress SARS-CoV-2/FLU/RSV testing.  Fact Sheet for Patients: EntrepreneurPulse.com.au  Fact Sheet for Healthcare Providers: IncredibleEmployment.be  This test is not yet approved or cleared by the [place] FDA and has been authorized for detection and/or diagnosis of SARS-CoV-2 by FDA under an Emergency Use Authorization (EUA). This EUA will remain in effect (meaning this test can be used) for the duration of the COVID-19 declaration under Section 564(b)(1) of the Act, 21 U.S.C. section 360bbb-3(b)(1), unless the authorization is terminated or revoked.  Performed at [facility], [place] [address], [place], [place] [address]   Blood Culture (routine x 2)     Status: None  (Preliminary result)   Collection Time: 08/18/21  8:10 PM   Specimen: BLOOD  Result Value Ref Range   Specimen Description BLOOD LEFT ARM    Special Requests      BOTTLES DRAWN AEROBIC AND ANAEROBIC Blood Culture adequate volume   Culture      NO GROWTH < 12 HOURS Performed at [facility], [place] [address], [place], [place] [address]    Report Status PENDING   Blood Culture (routine x 2)     Status: None (Preliminary result)   Collection Time: 08/18/21  8:12 PM   Specimen: BLOOD RIGHT HAND  Result Value Ref Range   Specimen Description BLOOD RIGHT HAND    Special Requests      BOTTLES DRAWN AEROBIC AND ANAEROBIC Blood Culture adequate volume   Culture      NO GROWTH < 12 HOURS Performed at [facility], [place] [address], [place], [place] [address]    Report Status PENDING   CBG monitoring, ED     Status: Abnormal   Collection Time: 08/18/21  9:58 PM  Result Value Ref Range   Glucose-Capillary 60 (L) 70 - 99 mg/dL    Comment: Glucose reference range applies only to samples taken after fasting for at least 8 hours.  Lactic acid, plasma     Status: Abnormal   Collection Time: 08/18/21 11:03 PM  Result Value Ref Range   Lactic Acid, Venous 2.1 (HH) 0.5 - 1.9 mmol/L    Comment: CRITICAL VALUE NOTED.  VALUE IS CONSISTENT WITH PREVIOUSLY REPORTED AND CALLED VALUE. Performed at [facility], [place]  [address] [place], [place] [address]   Basic metabolic panel     Status: Abnormal   Collection Time: 08/18/21 11:03 PM  Result Value Ref Range   Sodium 129 (L) 135 - 145 mmol/L   Potassium 4.6 3.5 - 5.1 mmol/L   Chloride 93 (L) 98 - 111 mmol/L   CO2 18 (L) 22 - 32 mmol/L   Glucose, Bld 98 70 - 99 mg/dL    Comment: Glucose reference range applies only to samples taken after fasting for at least 8 hours.   BUN 80 (H) 8 - 23 mg/dL   Creatinine, Ser 5.70 (H) 0.61 - 1.24 mg/dL   Calcium 7.8 (L) 8.9 - 10.3 mg/dL   GFR, Estimated 10 (L) >60 mL/min    Comment:  (NOTE) Calculated using the CKD-EPI Creatinine Equation (2021)    Anion gap 18 (H) 5 - 15    Comment: Performed at [facility] [address] [place], [place] [address]  CBG monitoring, ED     Status: Abnormal   Collection Time: 08/18/21 11:11 PM  Result Value Ref Range   Glucose-Capillary 111 (H) 70 - 99 mg/dL    Comment: Glucose reference range applies only to samples taken after fasting for at least 8 hours.  I-Stat arterial blood gas, ED     Status: Abnormal   Collection Time: 08/18/21 11:23 PM  Result Value Ref Range   pH, Arterial 7.386 7.350 - 7.450   pCO2 arterial 33.1 32.0 - 48.0 mmHg   pO2, Arterial 79 (L) 83.0 - 108.0 mmHg   Bicarbonate 19.8 (L) 20.0 - 28.0 mmol/L   TCO2 21 (L) 22 - 32 mmol/L   O2 Saturation 95.0 %   Acid-base deficit 5.0 (H) 0.0 - 2.0 mmol/L   Sodium 128 (L) 135 - 145 mmol/L   Potassium 4.4 3.5 - 5.1 mmol/L   Calcium, Ion 0.98 (L) 1.15 - 1.40 mmol/L   HCT 26.0 (L) 39.0 - 52.0 %   Hemoglobin 8.8 (L) 13.0 - 17.0 g/dL   Patient temperature 99.1 F    Collection site Radial    Drawn by RT    Sample type ARTERIAL   CBG monitoring, ED     Status: Abnormal   Collection Time: 08/19/21  1:32 AM  Result Value Ref Range   Glucose-Capillary 64 (L) 70 - 99 mg/dL    Comment: Glucose reference range applies only to samples taken after fasting for at least 8 hours.  Comprehensive metabolic panel     Status: Abnormal   Collection Time: 08/19/21  5:29 AM  Result Value Ref Range   Sodium 131 (L) 135 - 145 mmol/L   Potassium 4.4 3.5 - 5.1 mmol/L   Chloride 92 (L) 98 - 111 mmol/L   CO2 18 (L) 22 - 32 mmol/L   Glucose, Bld 87 70 - 99 mg/dL    Comment: Glucose reference range applies only to samples taken after fasting for at least 8 hours.   BUN 79 (H) 8 - 23 mg/dL   Creatinine, Ser 5.94 (H) 0.61 - 1.24 mg/dL   Calcium 8.1 (L) 8.9 - 10.3 mg/dL   Total Protein 7.1 6.5 - 8.1 g/dL   Albumin 2.2 (L) 3.5 - 5.0 g/dL   AST 43 (H) 15 - 41 U/L   ALT 14 0 - 44  U/L   Alkaline Phosphatase 192 (H) 38 - 126 U/L   Total Bilirubin 2.3 (H) 0.3 - 1.2 mg/dL   GFR, Estimated 9 (L) >60  mL/min    Comment: (NOTE) Calculated using the CKD-EPI Creatinine Equation (2021)    Anion gap 21 (H) 5 - 15    Comment: REPEATED TO VERIFY Performed at [facility], [address], [place], [place] [address]   CBC     Status: Abnormal   Collection Time: 08/19/21  5:29 AM  Result Value Ref Range   WBC 29.4 (H) 4.0 - 10.5 K/uL   RBC 3.46 (L) 4.22 - 5.81 MIL/uL   Hemoglobin 9.6 (L) 13.0 - 17.0 g/dL   HCT 29.7 (L) 39.0 - 52.0 %   MCV 85.8 80.0 - 100.0 fL   MCH 27.7 26.0 - 34.0 pg   MCHC 32.3 30.0 - 36.0 g/dL   RDW 16.4 (H) 11.5 - 15.5 %   Platelets 453 (H) 150 - 400 K/uL   nRBC 0.1 0.0 - 0.2 %    Comment: Performed at [facility] [address], [place], [place] [address]  Protime-INR     Status: Abnormal   Collection Time: 08/19/21  5:29 AM  Result Value Ref Range   Prothrombin Time 16.9 (H) 11.4 - 15.2 seconds   INR 1.4 (H) 0.8 - 1.2    Comment: (NOTE) INR goal varies based on device and disease states. Performed at [facility], [place] [address] [place], [place] [address]   APTT     Status: Abnormal   Collection Time: 08/19/21  5:29 AM  Result Value Ref Range   aPTT 48 (H) 24 - 36 seconds    Comment:        IF BASELINE aPTT IS ELEVATED, SUGGEST PATIENT RISK ASSESSMENT BE USED TO DETERMINE APPROPRIATE ANTICOAGULANT THERAPY. Performed at [facility], [place] [address] [place], [place] [address]   Ferritin     Status: None   Collection Time: 08/19/21  5:29 AM  Result Value Ref Range   Ferritin 241 24 - 336 ng/mL    Comment: Performed at [facility] [address], [place], [place] [address]  Iron and TIBC     Status: Abnormal   Collection Time: 08/19/21  5:29 AM  Result Value Ref Range   Iron 21 (L) 45 - 182 ug/dL   TIBC 188 (L) 250 - 450 ug/dL   Saturation Ratios 11 (L) 17.9 - 39.5 %   UIBC 167 ug/dL    Comment:  Performed at [facility], [place] [address], [place], [place] [address]  Hemoglobin A1c     Status: Abnormal   Collection Time: 08/19/21  5:29 AM  Result Value Ref Range   Hgb A1c MFr Bld 5.8 (H) 4.8 - 5.6 %    Comment: (NOTE) Pre diabetes:          5.7%-6.4%  Diabetes:              >6.4%  Glycemic control for   <7.0% adults with diabetes    Mean Plasma Glucose 119.76 mg/dL    Comment: Performed at [facility] [address], [place], [place] [address]  Hepatitis panel, acute     Status: None   Collection Time: 08/19/21  5:29 AM  Result Value Ref Range   Hepatitis B Surface Ag NON REACTIVE NON REACTIVE   HCV Ab NON REACTIVE NON REACTIVE    Comment: (NOTE) Nonreactive HCV antibody screen is consistent with no HCV infections,  unless recent infection is suspected or other evidence exists to indicate HCV infection.  Hep A IgM NON REACTIVE NON REACTIVE   Hep B C IgM NON REACTIVE NON REACTIVE    Comment: Performed at [facility], [place] [address] [place], [place] [address]  TSH     Status: None   Collection Time: 08/19/21  5:29 AM  Result Value Ref Range   TSH 1.371 0.350 - 4.500 uIU/mL    Comment: Performed by a 3rd Generation assay with a functional sensitivity of <=0.01 uIU/mL. Performed at [facility], [place] [address] [place], [place] [id number]   Salicylate level     Status: Abnormal   Collection Time: 08/19/21  5:29 AM  Result Value Ref Range   Salicylate Lvl <5.1 (L) 7.0 - 30.0 mg/dL    Comment: Performed at [facility] [address] [place], [place] [address]  Acetaminophen level     Status: Abnormal   Collection Time: 08/19/21  5:29 AM  Result Value Ref Range   Acetaminophen (Tylenol), Serum <10 (L) 10 - 30 ug/mL    Comment: (NOTE) Therapeutic concentrations vary significantly. A range of 10-30 ug/mL  may be an effective concentration for many patients. However, some  are best treated at concentrations outside of this  range. Acetaminophen concentrations >150 ug/mL at 4 hours after ingestion  and >50 ug/mL at 12 hours after ingestion are often associated with  toxic reactions.  Performed at [facility], [place] [address], [place], [place] [address]   Lipid panel     Status: Abnormal   Collection Time: 08/19/21  5:29 AM  Result Value Ref Range   Cholesterol 133 0 - 200 mg/dL   Triglycerides 208 (H) <150 mg/dL   HDL <10 (L) >40 mg/dL    Comment: REPEATED TO VERIFY   Total CHOL/HDL Ratio NOT CALCULATED RATIO   VLDL 42 (H) 0 - 40 mg/dL   LDL Cholesterol NOT CALCULATED 0 - 99 mg/dL    Comment: Performed at [facility] [address], [place], [place] [address]  CBG monitoring, ED     Status: None   Collection Time: 08/19/21  6:53 AM  Result Value Ref Range   Glucose-Capillary 96 70 - 99 mg/dL    Comment: Glucose reference range applies only to samples taken after fasting for at least 8 hours.  CBG monitoring, ED     Status: Abnormal   Collection Time: 08/19/21  7:57 AM  Result Value Ref Range   Glucose-Capillary 104 (H) 70 - 99 mg/dL    Comment: Glucose reference range applies only to samples taken after fasting for at least 8 hours.  Osmolality     Status: Abnormal   Collection Time: 08/19/21  7:58 AM  Result Value Ref Range   Osmolality 303 (H) 275 - 295 mOsm/kg    Comment: Performed at [facility], [place] [address], [place], [place] [address]  Lactic acid, plasma     Status: Abnormal   Collection Time: 08/19/21  7:58 AM  Result Value Ref Range   Lactic Acid, Venous 2.0 (HH) 0.5 - 1.9 mmol/L    Comment: CRITICAL VALUE NOTED.  VALUE IS CONSISTENT WITH PREVIOUSLY REPORTED AND CALLED VALUE. Performed at [facility], [place] [address] [place], [place] [address]   Troponin I (High Sensitivity)     Status: Abnormal   Collection Time: 08/19/21  7:58 AM  Result Value Ref Range   Troponin I (High Sensitivity) 28 (H) <18 ng/L    Comment: (NOTE) Elevated  high sensitivity  troponin I (hsTnI) values and significant  changes across serial measurements may suggest ACS but many other  chronic and acute conditions are known to elevate hsTnI results.  Refer to the "Links" section for chest pain algorithms and additional  guidance. Performed at [facility], [place] [address], [place], [place] [address]      ROS:  Review of systems not obtained due to patient factors.  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   His U/A is really mostly bland so does not seem like GN-  u/s does not show obstruction.  It would fit with a pre renal insult-  possibly a low BP episode on his ARB that gave him an AKI.  We dont usually see this much of a change in MS for a BUN in the 70's, or a sodium of 126-  which has corrected to 131.  I wonder if the AKI in addition to the meds he is taking-  specifically lyrica, narcotics and klonopin are what is responsible for his AMS. There are no acute indications for dialysis -  hold losartan and other BP meds as well as sedating agents and follow 2. Hypertension/volume  - he does not seen overloaded-  dark urine.  I am OK with IVF for now-  is on every 4 hour BMPs 3. Metabolic acidosis-  not extreme , giving LR 4. ID-  with elevated WBC-  looking for infection, cultures pending- giving cefepime/flagyl and vanc 5. Anemia  - hgb 9.6-  not that  extreme-  to follow    Louis Meckel 08/19/2021, 12:04 PM

## 2021-08-19 NOTE — ED Notes (Signed)
MD notified of troponin of 76. MD also notified of current CBG of 82. Pt is NPO. MD notified if possible to switch to dextrose IVF. Waiting for new orders. Will continue to monitor.

## 2021-08-19 NOTE — ED Notes (Addendum)
Mittens placed to both hands as pt keeps on removing EKG leads, pulse O2 and bp cuff. Attempted to remove IV at this time. Will continue to monitor. MD paged for further orders.

## 2021-08-19 NOTE — ED Notes (Signed)
Pt constantly fights restraints. Reapplied and retighten by staff multiple times. Pt constantly hallucinating with CIWA of 23. Ativan 4mg  IV given. Will continue to monitor.

## 2021-08-19 NOTE — ED Notes (Signed)
Glucose 64, Dr. Raymondo Band made aware and stated she will put in for an amp of D50.

## 2021-08-19 NOTE — Anesthesia Preprocedure Evaluation (Addendum)
Anesthesia Evaluation  Patient identified by MRN, date of birth, ID band Patient confused    Reviewed: Allergy & Precautions, H&P , NPO status , Patient's Chart, lab work & pertinent test results  Airway Mallampati: II   Neck ROM: full    Dental   Pulmonary COPD,  COPD inhaler, former smoker,    breath sounds clear to auscultation       Cardiovascular hypertension, Pt. on medications and Pt. on home beta blockers + Peripheral Vascular Disease   Rhythm:regular Rate:Normal     Neuro/Psych Anxiety    GI/Hepatic GERD  ,(+)     substance abuse  alcohol use, Elevated LFTs   Endo/Other  diabetes, Insulin Dependent, Oral Hypoglycemic Agents  Renal/GU ARF and Renal InsufficiencyRenal disease     Musculoskeletal  (+) Arthritis ,   Abdominal   Peds  Hematology  (+) Blood dyscrasia (Hb 9.6), anemia , INR 1.4   Anesthesia Other Findings   Reproductive/Obstetrics                            Anesthesia Physical Anesthesia Plan  ASA: 3 and emergent  Anesthesia Plan: General   Post-op Pain Management:    Induction: Intravenous  PONV Risk Score and Plan: 2 and Ondansetron, Dexamethasone and Treatment may vary due to age or medical condition  Airway Management Planned: Oral ETT  Additional Equipment: None  Intra-op Plan:   Post-operative Plan: Extubation in OR  Informed Consent: I have reviewed the patients History and Physical, chart, labs and discussed the procedure including the risks, benefits and alternatives for the proposed anesthesia with the patient or authorized representative who has indicated his/her understanding and acceptance.     Dental advisory given  Plan Discussed with: CRNA, Anesthesiologist and Surgeon  Anesthesia Plan Comments:        Anesthesia Quick Evaluation

## 2021-08-19 NOTE — Interval H&P Note (Signed)
History and Physical Interval Note:  08/19/2021 6:24 PM  Leslie [name]  has presented today for surgery, with the diagnosis of Septic Left knee.  The various methods of treatment have been discussed with the patient and family. After consideration of risks, benefits and other options for treatment, the patient has consented to  Procedure(s): IRRIGATION AND DEBRIDEMENT KNEE (Left) as a surgical intervention.  The patient's history has been reviewed, patient examined, no change in status, stable for surgery.  I have reviewed the patient's chart and labs.  Questions were answered to the patient's satisfaction.     Mauri Pole

## 2021-08-19 NOTE — Progress Notes (Signed)
PT Cancellation Note  Patient Details Name: Leslie [name] MRN: [id number] DOB: 1946-05-21   Cancelled Treatment:    Reason Eval/Treat Not Completed: Medical issues which prohibited therapy Pt currently agitated and in restraints and not appropriate to work with PT. Will follow up as schedule allows and as medically appropriate.   [name], DPT  Acute Rehabilitation Services  Pager: [phone] Office: [phone]  [name] 08/19/2021, 12:04 PM

## 2021-08-19 NOTE — Consult Note (Signed)
Reason for Consult:Left knee pain Referring Physician: Charise Killian Time called: [id number] Time at bedside: Leslie [name] is an 75 y.o. male.  HPI: Leslie [name] was brought to the ED with AMS presumably 2/2 sepsis. He is confused and can't really contribute much to history. He does c/o significant left knee pain when asked.  Past Medical History:  Diagnosis Date   Anxiety    Panic attack - 10  years aago- ? panic    Asthma    Cellulitis of left foot 2016   Healed and recurred 01/2016   Cellulitis of right foot 06/30/2012   Chronic lower back pain 07/01/2012   COPD (chronic obstructive pulmonary disease) ([place])    Uses nebulizers at home. Former smoker, current chewing tobacco. 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   GERD (gastroesophageal reflux disease)    Gouty arthritis 07/01/2012   Not attacks in ~5 yrs (03/2016)   Hepatitis 1967   "in jail when I got it; dr said it was pretty bad; quaranteened X 30d"   Hyperlipidemia    Hypertension    Lung nodule, solitary 07/2015   Due for 1-yr recheck in 07/2017   Peripheral arterial disease ([place])    critical limb ischemia   Peripheral neuropathy 07/01/2012   Seasonal allergies    Skin growth 07/01/2012   right side of nares; "been on there 20 years"   Substance abuse ([place])    Type II diabetes mellitus ([place]) 2002   Diagnosed in 2002    Past Surgical History:  Procedure Laterality Date   AMPUTATION Left 03/28/2016   Procedure: AMPUTATION left foot 4th and 5 th ray;  Surgeon: Newt Minion, MD;  Location: [facility];  Service: Orthopedics;  Laterality: Left;   AMPUTATION Left 04/11/2016   Procedure: LEFT BELOW KNEE AMPUTATION;  Surgeon: Newt Minion, MD;  Location: [facility];  Service: Orthopedics;  Laterality: Left;   AMPUTATION Right 01/22/2018   Procedure: RIGHT FOOT 3RD RAY AMPUTATION;  Surgeon: Newt Minion, MD;  Location: [facility];  Service: Orthopedics;  Laterality: Right;   AMPUTATION Right 02/19/2018   Procedure: RIGHT TRANSMETATARSAL AMPUTATION;  Surgeon: Newt Minion, MD;  Location: [facility];  Service: Orthopedics;  Laterality: Right;   AMPUTATION Right 03/28/2018   Procedure: RIGHT BELOW KNEE AMPUTATION;  Surgeon: Newt Minion, MD;  Location: [facility];  Service: Orthopedics;  Laterality: Right;   LEG AMPUTATION BELOW KNEE Left 04/11/2016   LOWER EXTREMITY ANGIOGRAM N/A 04/18/2013   Procedure: LOWER EXTREMITY ANGIOGRAM;  Surgeon: Sherren Mocha, MD;  Location: [facility] C[facility];  Service: Cardiovascular;  Laterality: N/A;   LOWER EXTREMITY ANGIOGRAM  08/13/2015   bilateral iliac    PERIPHERAL VASCULAR CATHETERIZATION Bilateral 08/13/2015   Procedure: Lower Extremity Angiography;  Surgeon: Lorretta Harp, MD;  Location: [facility];  Service: Cardiovascular;  Laterality: Bilateral;   PERIPHERAL VASCULAR CATHETERIZATION N/A 08/13/2015   Procedure: Abdominal Aortogram;  Surgeon: Lorretta Harp, MD;  Location: [facility];  Service: Cardiovascular;  Laterality: N/A;   TOE AMPUTATION Right 01/22/2018   3RD TOE RIGHT FOOT    Family History  Problem Relation Age of Onset   Diabetes Sister    Hypertension Mother     Social History:  reports that he quit smoking about 19 years ago. His smoking use included cigarettes. He has a 80.00 pack-year smoking history. His smokeless tobacco use includes chew. He reports that he does not currently use alcohol. He reports that he does not  use drugs.  Allergies:  Allergies  Allergen Reactions   Atorvastatin Other (See Comments)    Unknown reaction   Statins Other (See Comments)    Unknown reaction - on MAR   Flexeril [Cyclobenzaprine] Other (See Comments)    Dry mouth and hallucinations    Medications: I have reviewed the patient's current medications.  Results for orders placed or performed during the hospital encounter of 08/18/21 (from the past 48 hour(s))  Comprehensive metabolic panel     Status: Abnormal   Collection Time: 08/18/21  5:36 PM  Result Value Ref Range   Sodium 126 (L) 135 - 145 mmol/L    Potassium 4.4 3.5 - 5.1 mmol/L   Chloride 90 (L) 98 - 111 mmol/L   CO2 19 (L) 22 - 32 mmol/L   Glucose, Bld 100 (H) 70 - 99 mg/dL    Comment: Glucose reference range applies only to samples taken after fasting for at least 8 hours.   BUN 77 (H) 8 - 23 mg/dL   Creatinine, Ser 5.68 (H) 0.61 - 1.24 mg/dL   Calcium 8.1 (L) 8.9 - 10.3 mg/dL   Total Protein 6.7 6.5 - 8.1 g/dL   Albumin 2.0 (L) 3.5 - 5.0 g/dL   AST 37 15 - 41 U/L   ALT 14 0 - 44 U/L   Alkaline Phosphatase 168 (H) 38 - 126 U/L   Total Bilirubin 1.8 (H) 0.3 - 1.2 mg/dL   GFR, Estimated 10 (L) >60 mL/min    Comment: (NOTE) Calculated using the CKD-EPI Creatinine Equation (2021)    Anion gap 17 (H) 5 - 15    Comment: Performed at [facility], [place] [address], [place], [place] [address]  CBC with Differential     Status: Abnormal   Collection Time: 08/18/21  5:36 PM  Result Value Ref Range   WBC 27.1 (H) 4.0 - 10.5 K/uL   RBC 3.33 (L) 4.22 - 5.81 MIL/uL   Hemoglobin 9.2 (L) 13.0 - 17.0 g/dL   HCT 28.2 (L) 39.0 - 52.0 %   MCV 84.7 80.0 - 100.0 fL   MCH 27.6 26.0 - 34.0 pg   MCHC 32.6 30.0 - 36.0 g/dL   RDW 15.9 (H) 11.5 - 15.5 %   Platelets 403 (H) 150 - 400 K/uL   nRBC 0.0 0.0 - 0.2 %   Neutrophils Relative % 83 %   Neutro Abs 22.3 (H) 1.7 - 7.7 K/uL   Lymphocytes Relative 4 %   Lymphs Abs 1.1 0.7 - 4.0 K/uL   Monocytes Relative 7 %   Monocytes Absolute 2.0 (H) 0.1 - 1.0 K/uL   Eosinophils Relative 1 %   Eosinophils Absolute 0.4 0.0 - 0.5 K/uL   Basophils Relative 1 %   Basophils Absolute 0.1 0.0 - 0.1 K/uL   Immature Granulocytes 4 %   Abs Immature Granulocytes 1.11 (H) 0.00 - 0.07 K/uL    Comment: Performed at [facility] [address], [place], [place] [address]  Lactic acid, plasma     Status: Abnormal   Collection Time: 08/18/21  5:36 PM  Result Value Ref Range   Lactic Acid, Venous 2.7 (HH) 0.5 - 1.9 mmol/L    Comment: CRITICAL RESULT CALLED TO, READ BACK BY AND VERIFIED WITH: ADAM G RN  BY SSTEPHENS 1948 [id number] Performed at [facility], [place] [address] [place], [place] [address]   Ethanol     Status: None   Collection Time: 08/18/21  5:36  PM  Result Value Ref Range   Alcohol, Ethyl (B) <10 <10 mg/dL    Comment: (NOTE) Lowest detectable limit for serum alcohol is 10 mg/dL.  For medical purposes only. Performed at [facility], [place] [address], [place], [place] [address]   Protime-INR     Status: Abnormal   Collection Time: 08/18/21  5:36 PM  Result Value Ref Range   Prothrombin Time 15.6 (H) 11.4 - 15.2 seconds   INR 1.2 0.8 - 1.2    Comment: (NOTE) INR goal varies based on device and disease states. Performed at [facility], [place] [address] [place], [place] [address]   APTT     Status: Abnormal   Collection Time: 08/18/21  5:36 PM  Result Value Ref Range   aPTT 51 (H) 24 - 36 seconds    Comment:        IF BASELINE aPTT IS ELEVATED, SUGGEST PATIENT RISK ASSESSMENT BE USED TO DETERMINE APPROPRIATE ANTICOAGULANT THERAPY. Performed at [facility], [place] [address], [place], [place] [address]   CK     Status: None   Collection Time: 08/18/21  5:36 PM  Result Value Ref Range   Total CK 221 49 - 397 U/L    Comment: Performed at [facility], [place] [address] [place], [place] [address]  POC CBG, ED     Status: Abnormal   Collection Time: 08/18/21  5:50 PM  Result Value Ref Range   Glucose-Capillary 107 (H) 70 - 99 mg/dL    Comment: Glucose reference range applies only to samples taken after fasting for at least 8 hours.  CBG monitoring, ED     Status: None   Collection Time: 08/18/21  7:00 PM  Result Value Ref Range   Glucose-Capillary 72 70 - 99 mg/dL    Comment: Glucose reference range applies only to samples taken after fasting for at least 8 hours.  Resp Panel by RT-PCR (Flu A&B, Covid) Nasopharyngeal Swab     Status: None   Collection Time: 08/18/21  7:36 PM   Specimen: Nasopharyngeal Swab; Nasopharyngeal(NP) swabs in  vial transport medium  Result Value Ref Range   SARS Coronavirus 2 by RT PCR NEGATIVE NEGATIVE    Comment: (NOTE) SARS-CoV-2 target nucleic acids are NOT DETECTED.  The SARS-CoV-2 RNA is generally detectable in upper respiratory specimens during the acute phase of infection. The lowest concentration of SARS-CoV-2 viral copies this assay can detect is 138 copies/mL. A negative result does not preclude SARS-Cov-2 infection and should not be used as the sole basis for treatment or other patient management decisions. A negative result may occur with  improper specimen collection/handling, submission of specimen other than nasopharyngeal swab, presence of viral mutation(s) within the areas targeted by this assay, and inadequate number of viral copies(<138 copies/mL). A negative result must be combined with clinical observations, patient history, and epidemiological information. The expected result is Negative.  Fact Sheet for Patients:  EntrepreneurPulse.com.au  Fact Sheet for Healthcare Providers:  IncredibleEmployment.be  This test is no t yet approved or cleared by the [place] FDA and  has been authorized for detection and/or diagnosis of SARS-CoV-2 by FDA under an Emergency Use Authorization (EUA). This EUA will remain  in effect (meaning this test can be used) for the duration of the COVID-19 declaration under Section 564(b)(1) of the Act, 21 U.S.C.section 360bbb-3(b)(1), unless the authorization is terminated  or revoked sooner.       Influenza A by  PCR NEGATIVE NEGATIVE   Influenza B by PCR NEGATIVE NEGATIVE    Comment: (NOTE) The Xpert Xpress SARS-CoV-2/FLU/RSV plus assay is intended as an aid in the diagnosis of influenza from Nasopharyngeal swab specimens and should not be used as a sole basis for treatment. Nasal washings and aspirates are unacceptable for Xpert Xpress SARS-CoV-2/FLU/RSV testing.  Fact Sheet for  Patients: EntrepreneurPulse.com.au  Fact Sheet for Healthcare Providers: IncredibleEmployment.be  This test is not yet approved or cleared by the [place] FDA and has been authorized for detection and/or diagnosis of SARS-CoV-2 by FDA under an Emergency Use Authorization (EUA). This EUA will remain in effect (meaning this test can be used) for the duration of the COVID-19 declaration under Section 564(b)(1) of the Act, 21 U.S.C. section 360bbb-3(b)(1), unless the authorization is terminated or revoked.  Performed at [facility], [name] [address] [place], [place] [address]   Blood Culture (routine x 2)     Status: None (Preliminary result)   Collection Time: 08/18/21  8:10 PM   Specimen: BLOOD  Result Value Ref Range   Specimen Description BLOOD LEFT ARM    Special Requests      BOTTLES DRAWN AEROBIC AND ANAEROBIC Blood Culture adequate volume   Culture      NO GROWTH < 12 HOURS Performed at [facility], [place] [address], [place], [place] [address]    Report Status PENDING   Blood Culture (routine x 2)     Status: None (Preliminary result)   Collection Time: 08/18/21  8:12 PM   Specimen: BLOOD RIGHT HAND  Result Value Ref Range   Specimen Description BLOOD RIGHT HAND    Special Requests      BOTTLES DRAWN AEROBIC AND ANAEROBIC Blood Culture adequate volume   Culture      NO GROWTH < 12 HOURS Performed at [facility], [place] [address] [place], [place] [address]    Report Status PENDING   CBG monitoring, ED     Status: Abnormal   Collection Time: 08/18/21  9:58 PM  Result Value Ref Range   Glucose-Capillary 60 (L) 70 - 99 mg/dL    Comment: Glucose reference range applies only to samples taken after fasting for at least 8 hours.  Lactic acid, plasma     Status: Abnormal   Collection Time: 08/18/21 11:03 PM  Result Value Ref Range   Lactic Acid, Venous 2.1 (HH) 0.5 - 1.9 mmol/L    Comment: CRITICAL VALUE NOTED.   VALUE IS CONSISTENT WITH PREVIOUSLY REPORTED AND CALLED VALUE. Performed at [facility], [place] [address], [place], [place] [address]   Basic metabolic panel     Status: Abnormal   Collection Time: 08/18/21 11:03 PM  Result Value Ref Range   Sodium 129 (L) 135 - 145 mmol/L   Potassium 4.6 3.5 - 5.1 mmol/L   Chloride 93 (L) 98 - 111 mmol/L   CO2 18 (L) 22 - 32 mmol/L   Glucose, Bld 98 70 - 99 mg/dL    Comment: Glucose reference range applies only to samples taken after fasting for at least 8 hours.   BUN 80 (H) 8 - 23 mg/dL   Creatinine, Ser 5.70 (H) 0.61 - 1.24 mg/dL   Calcium 7.8 (L) 8.9 - 10.3 mg/dL   GFR, Estimated 10 (L) >60 mL/min    Comment: (NOTE) Calculated using the CKD-EPI Creatinine Equation (2021)    Anion gap 18 (H) 5 - 15    Comment:  Performed at [facility], [place] [address] [place], [place] [address]  CBG monitoring, ED     Status: Abnormal   Collection Time: 08/18/21 11:11 PM  Result Value Ref Range   Glucose-Capillary 111 (H) 70 - 99 mg/dL    Comment: Glucose reference range applies only to samples taken after fasting for at least 8 hours.  I-Stat arterial blood gas, ED     Status: Abnormal   Collection Time: 08/18/21 11:23 PM  Result Value Ref Range   pH, Arterial 7.386 7.350 - 7.450   pCO2 arterial 33.1 32.0 - 48.0 mmHg   pO2, Arterial 79 (L) 83.0 - 108.0 mmHg   Bicarbonate 19.8 (L) 20.0 - 28.0 mmol/L   TCO2 21 (L) 22 - 32 mmol/L   O2 Saturation 95.0 %   Acid-base deficit 5.0 (H) 0.0 - 2.0 mmol/L   Sodium 128 (L) 135 - 145 mmol/L   Potassium 4.4 3.5 - 5.1 mmol/L   Calcium, Ion 0.98 (L) 1.15 - 1.40 mmol/L   HCT 26.0 (L) 39.0 - 52.0 %   Hemoglobin 8.8 (L) 13.0 - 17.0 g/dL   Patient temperature 99.1 F    Collection site Radial    Drawn by RT    Sample type ARTERIAL   CBG monitoring, ED     Status: Abnormal   Collection Time: 08/19/21  1:32 AM  Result Value Ref Range   Glucose-Capillary 64 (L) 70 - 99 mg/dL    Comment: Glucose reference  range applies only to samples taken after fasting for at least 8 hours.  Comprehensive metabolic panel     Status: Abnormal   Collection Time: 08/19/21  5:29 AM  Result Value Ref Range   Sodium 131 (L) 135 - 145 mmol/L   Potassium 4.4 3.5 - 5.1 mmol/L   Chloride 92 (L) 98 - 111 mmol/L   CO2 18 (L) 22 - 32 mmol/L   Glucose, Bld 87 70 - 99 mg/dL    Comment: Glucose reference range applies only to samples taken after fasting for at least 8 hours.   BUN 79 (H) 8 - 23 mg/dL   Creatinine, Ser 5.94 (H) 0.61 - 1.24 mg/dL   Calcium 8.1 (L) 8.9 - 10.3 mg/dL   Total Protein 7.1 6.5 - 8.1 g/dL   Albumin 2.2 (L) 3.5 - 5.0 g/dL   AST 43 (H) 15 - 41 U/L   ALT 14 0 - 44 U/L   Alkaline Phosphatase 192 (H) 38 - 126 U/L   Total Bilirubin 2.3 (H) 0.3 - 1.2 mg/dL   GFR, Estimated 9 (L) >60 mL/min    Comment: (NOTE) Calculated using the CKD-EPI Creatinine Equation (2021)    Anion gap 21 (H) 5 - 15    Comment: REPEATED TO VERIFY Performed at [facility], [address], [place], [place] [address]   CBC     Status: Abnormal   Collection Time: 08/19/21  5:29 AM  Result Value Ref Range   WBC 29.4 (H) 4.0 - 10.5 K/uL   RBC 3.46 (L) 4.22 - 5.81 MIL/uL   Hemoglobin 9.6 (L) 13.0 - 17.0 g/dL   HCT 29.7 (L) 39.0 - 52.0 %   MCV 85.8 80.0 - 100.0 fL   MCH 27.7 26.0 - 34.0 pg   MCHC 32.3 30.0 - 36.0 g/dL   RDW 16.4 (H) 11.5 - 15.5 %   Platelets 453 (H) 150 - 400 K/uL   nRBC 0.1 0.0 - 0.2 %  Comment: Performed at [facility], [place] [address] [place], [place] [address]  Protime-INR     Status: Abnormal   Collection Time: 08/19/21  5:29 AM  Result Value Ref Range   Prothrombin Time 16.9 (H) 11.4 - 15.2 seconds   INR 1.4 (H) 0.8 - 1.2    Comment: (NOTE) INR goal varies based on device and disease states. Performed at [facility], [place] [address], [place], [place] [address]   APTT     Status: Abnormal   Collection Time: 08/19/21  5:29 AM  Result Value Ref Range   aPTT 48 (H) 24 -  36 seconds    Comment:        IF BASELINE aPTT IS ELEVATED, SUGGEST PATIENT RISK ASSESSMENT BE USED TO DETERMINE APPROPRIATE ANTICOAGULANT THERAPY. Performed at [facility], [place] [address], [place], [place] [address]   Ferritin     Status: None   Collection Time: 08/19/21  5:29 AM  Result Value Ref Range   Ferritin 241 24 - 336 ng/mL    Comment: Performed at [facility] [address], [place], [place] [address]  Iron and TIBC     Status: Abnormal   Collection Time: 08/19/21  5:29 AM  Result Value Ref Range   Iron 21 (L) 45 - 182 ug/dL   TIBC 188 (L) 250 - 450 ug/dL   Saturation Ratios 11 (L) 17.9 - 39.5 %   UIBC 167 ug/dL    Comment: Performed at [facility], [place] [address], [place], [place] [address]  Hemoglobin A1c     Status: Abnormal   Collection Time: 08/19/21  5:29 AM  Result Value Ref Range   Hgb A1c MFr Bld 5.8 (H) 4.8 - 5.6 %    Comment: (NOTE) Pre diabetes:          5.7%-6.4%  Diabetes:              >6.4%  Glycemic control for   <7.0% adults with diabetes    Mean Plasma Glucose 119.76 mg/dL    Comment: Performed at [facility] [address] [place], [place] [address]  TSH     Status: None   Collection Time: 08/19/21  5:29 AM  Result Value Ref Range   TSH 1.371 0.350 - 4.500 uIU/mL    Comment: Performed by a 3rd Generation assay with a functional sensitivity of <=0.01 uIU/mL. Performed at [facility], [place] [address], [place], [place] [address]   Salicylate level     Status: Abnormal   Collection Time: 08/19/21  5:29 AM  Result Value Ref Range   Salicylate Lvl <3.8 (L) 7.0 - 30.0 mg/dL    Comment: Performed at [facility] [address], [place], [place] [address]  Acetaminophen level     Status: Abnormal   Collection Time: 08/19/21  5:29 AM  Result Value Ref Range   Acetaminophen (Tylenol), Serum <10 (L) 10 - 30 ug/mL    Comment: (NOTE) Therapeutic concentrations vary significantly. A range of 10-30 ug/mL   may be an effective concentration for many patients. However, some  are best treated at concentrations outside of this range. Acetaminophen concentrations >150 ug/mL at 4 hours after ingestion  and >50 ug/mL at 12 hours after ingestion are often associated with  toxic reactions.  Performed at [facility], [place] [address] [place],  [address]   Lipid panel     Status:  Abnormal   Collection Time: 08/19/21  5:29 AM  Result Value Ref Range   Cholesterol 133 0 - 200 mg/dL   Triglycerides 208 (H) <150 mg/dL   HDL <10 (L) >40 mg/dL    Comment: REPEATED TO VERIFY   Total CHOL/HDL Ratio NOT CALCULATED RATIO   VLDL 42 (H) 0 - 40 mg/dL   LDL Cholesterol NOT CALCULATED 0 - 99 mg/dL    Comment: Performed at [facility] [address] [place], [place] [address]  CBG monitoring, ED     Status: None   Collection Time: 08/19/21  6:53 AM  Result Value Ref Range   Glucose-Capillary 96 70 - 99 mg/dL    Comment: Glucose reference range applies only to samples taken after fasting for at least 8 hours.  CBG monitoring, ED     Status: Abnormal   Collection Time: 08/19/21  7:57 AM  Result Value Ref Range   Glucose-Capillary 104 (H) 70 - 99 mg/dL    Comment: Glucose reference range applies only to samples taken after fasting for at least 8 hours.  Osmolality     Status: Abnormal   Collection Time: 08/19/21  7:58 AM  Result Value Ref Range   Osmolality 303 (H) 275 - 295 mOsm/kg    Comment: Performed at [facility], [place] [address], [place], [place] [address]  Lactic acid, plasma     Status: Abnormal   Collection Time: 08/19/21  7:58 AM  Result Value Ref Range   Lactic Acid, Venous 2.0 (HH) 0.5 - 1.9 mmol/L    Comment: CRITICAL VALUE NOTED.  VALUE IS CONSISTENT WITH PREVIOUSLY REPORTED AND CALLED VALUE. Performed at [facility], [place] [address], [place], [place] [address]   Troponin I (High Sensitivity)     Status: Abnormal   Collection Time: 08/19/21  7:58 AM   Result Value Ref Range   Troponin I (High Sensitivity) 28 (H) <18 ng/L    Comment: (NOTE) Elevated high sensitivity troponin I (hsTnI) values and significant  changes across serial measurements may suggest ACS but many other  chronic and acute conditions are known to elevate hsTnI results.  Refer to the "Links" section for chest pain algorithms and additional  guidance. Performed at [facility], [place] [address] [place], [place] [address]     CT HEAD WO CONTRAST (5MM)  Result Date: 08/18/2021 CLINICAL DATA:  Delirium, altered mental status. EXAM: CT HEAD WITHOUT CONTRAST TECHNIQUE: Contiguous axial images were obtained from the base of the skull through the vertex without intravenous contrast. COMPARISON:  CT head dated 12/13/2020. FINDINGS: Brain: No evidence of acute infarction, hemorrhage, hydrocephalus, extra-axial collection or mass lesion/mass effect. There is mild cerebral volume loss with associated ex vacuo dilatation. Periventricular white matter hypoattenuation likely represents chronic small vessel ischemic disease. Vascular: There are vascular calcifications in the carotid siphons. Skull: Normal. Negative for fracture or focal lesion. Sinuses/Orbits: There is a small left mastoid effusion. There is mild right ethmoid sinus disease. Other: None. IMPRESSION: No acute intracranial process. Electronically Signed   By: Zerita Boers M.D.   On: 08/18/2021 18:12   US Abdomen Complete  Result Date: 08/19/2021 CLINICAL DATA:  Abdominal pain EXAM: ABDOMEN ULTRASOUND COMPLETE COMPARISON:  None. FINDINGS: Gallbladder: No gallstones or wall thickening visualized. Minimal sludge. No sonographic Murphy sign noted by sonographer. Common bile duct: Diameter: 6 mm Liver: No focal lesion identified. Within normal limits in parenchymal echogenicity. Portal vein is patent on color Doppler imaging  with normal direction of blood flow towards the liver. IVC: No abnormality visualized. Pancreas: Not  visualized due to overlying bowel gas. Spleen: Size and appearance within normal limits. Adjacent splenule. Right Kidney: Length: 9.4 cm. Echogenicity within normal limits. No mass or hydronephrosis visualized. Left Kidney: Length: 11.1 cm. Echogenicity within normal limits. No mass or hydronephrosis visualized. Abdominal aorta: No aneurysm visualized. Other findings: None. IMPRESSION: 1. Minimal sludge within the gallbladder. No evidence of cholecystitis. 2. Otherwise unremarkable ultrasound of the abdomen. Electronically Signed   By: Merilyn Baba M.D.   On: 08/19/2021 00:26   DG Chest Portable 1 View  Result Date: 08/18/2021 CLINICAL DATA:  Altered mental status.  Hypoglycemia. EXAM: PORTABLE CHEST 1 VIEW COMPARISON:  Chest radiograph 03/27/2018.  Chest CT 01/03/2019 FINDINGS: Upper normal heart size, stable from prior exam. Unchanged mediastinal contours. Aortic atherosclerosis and tortuosity. Scattered calcified pleural plaques, better demonstrated on prior CT. No acute airspace disease. No pulmonary edema, pleural effusion, or pneumothorax. No acute osseous abnormalities are seen. IMPRESSION: No acute findings. Aortic Atherosclerosis (ICD10-I70.0). Electronically Signed   By: Keith Rake M.D.   On: 08/18/2021 19:15   DG Knee Left Port  Result Date: 08/18/2021 CLINICAL DATA:  Sepsis, possible infection. EXAM: PORTABLE LEFT KNEE - 1-2 VIEW COMPARISON:  Included portions from femur radiograph 08/12/2021 FINDINGS: Below the knee amputation. The resection margins are smooth. No erosion or periosteal reaction. The knee joint is intact. Minimal degenerative change. No erosion or bony destruction. Advanced vascular calcifications. Mild generalized soft tissue edema. No soft tissue air. Similar density adjacent to the distal femur in the lateral soft tissues, well-defined margins and of doubtful clinical significance. IMPRESSION: 1. Below the knee amputation. No radiographic findings of osteomyelitis. 2.  Mild soft tissue edema. Electronically Signed   By: Keith Rake M.D.   On: 08/18/2021 19:34    Review of Systems  Unable to perform ROS: Mental status change  Musculoskeletal:  Positive for arthralgias (Left knee).  Blood pressure (!) 159/98, pulse 88, temperature 98.9 F (37.2 C), temperature source Oral, resp. rate 10, weight 90.1 kg, SpO2 95 %. Physical Exam Constitutional:      General: He is not in acute distress.    Appearance: He is well-developed. He is not diaphoretic.  HENT:     Head: Normocephalic and atraumatic.  Eyes:     General: No scleral icterus.       Right eye: No discharge.        Left eye: No discharge.     Conjunctiva/sclera: Conjunctivae normal.  Cardiovascular:     Rate and Rhythm: Normal rate and regular rhythm.  Pulmonary:     Effort: Pulmonary effort is normal. No respiratory distress.  Musculoskeletal:     Cervical back: Normal range of motion.     Comments: LLE No traumatic wounds, ecchymosis, or rash  S/p BKA, mild diffuse erythema knee, mod TTP diffusely knee, mod pain with AROM/PROM  Mild knee effusion  Knee stable to varus/ valgus and anterior/posterior stress  Skin:    General: Skin is warm and dry.  Neurological:     Mental Status: He is alert.  Psychiatric:        Mood and Affect: Mood normal.        Behavior: Behavior normal.    Assessment/Plan: Left knee pain -- Will plan on arthrocentesis through only clear window anteriorly. Please keep NPO.    Lisette Abu, PA-C Orthopedic Surgery [phone] 08/19/2021, 9:31 AM

## 2021-08-19 NOTE — ED Notes (Signed)
Pt was found pulling out the IV. Pt pulled out EKG leads, bp cuff and pulse O2. MD notified. Restraints to bilateral wrists applied for safety. MD at bedside.

## 2021-08-19 NOTE — Progress Notes (Signed)
Inpatient Diabetes Program Recommendations  AACE/ADA: New Consensus Statement on Inpatient Glycemic Control (2015)  Target Ranges:  Prepandial:   less than 140 mg/dL      Peak postprandial:   less than 180 mg/dL (1-2 hours)      Critically ill patients:  140 - 180 mg/dL   Lab Results  Component Value Date   GLUCAP 104 (H) 08/19/2021   HGBA1C 5.8 (H) 08/19/2021    Review of Glycemic Control Results for Leslie [name], Leslie [name] (MRN [id number]) as of 08/19/2021 12:15  Ref. Range 08/18/2021 17:50 08/18/2021 19:00 08/18/2021 21:58 08/18/2021 23:11 08/19/2021 01:32 08/19/2021 06:53 08/19/2021 07:57  Glucose-Capillary Latest Ref Range: 70 - 99 mg/dL 107 (H) 72 60 (L) 111 (H) 64 (L) 96 104 (H)   Diabetes history: DM2 Outpatient Diabetes medications: Lantus 20 units qd, Metformin 1 gm bid Current orders for Inpatient glycemic control: None @ this time  Inpatient Diabetes Program Recommendations:   Patient currently in ED. Noted CBG 57 per EMS prior to admission. CBG 104 this am without medications. Diabetes medications will need evaluated on discharge.  Thank you, Nani Gasser. Cj Beecher, RN, MSN, CDE  Diabetes Coordinator Inpatient Glycemic Control Team Team Pager [phone] (8am-5pm) 08/19/2021 12:18 PM

## 2021-08-19 NOTE — Procedures (Signed)
Procedure: Left knee aspiration and injection   Indication: Left knee effusion(s)   Surgeon: Silvestre Gunner, PA-C   Assist: None   Anesthesia: Topical refrigerant   EBL: None   Complications: None   Findings: After risks/benefits explained patient desires to undergo procedure. Verbal consent obtained, witnessed by nurse, and time out performed. The left knee was sterilely prepped and aspirated. <62ml frank pus was aspirated. 85ml 0.5% Marcaine instilled. Pt tolerated the procedure well.       Lisette Abu, PA-C Orthopedic Surgery [phone]

## 2021-08-19 NOTE — ED Notes (Signed)
Pt woke up a few times and tried to move around the bed. Pt states, "Get me out of this!" Pt was upset about being on restraints. Pt redirected by staff. Continues to be confused at this time. Will continue to monitor. Safety precautions maintained.

## 2021-08-19 NOTE — Brief Op Note (Signed)
08/18/2021 - 08/19/2021  6:25 PM  PATIENT:  Leslie [name]  75 y.o. male  PRE-OPERATIVE DIAGNOSIS:  Septic native left knee   POST-OPERATIVE DIAGNOSIS:  Septic native left knee   PROCEDURE:  Procedure(s): IRRIGATION AND DEBRIDEMENT KNEE (Left)  SURGEON:  Surgeon(s) and Role:    Paralee Cancel, MD - Primary  PHYSICIAN ASSISTANT: Costella Hatcher, PA-C  ANESTHESIA:   general  EBL:  Minimal  BLOOD ADMINISTERED:none  DRAINS: (one medium) Hemovact drain(s) in the left knee with  Suction Open   LOCAL MEDICATIONS USED:  NONE  SPECIMEN:  Source of Specimen:  left knee synovial fluid and swabs  DISPOSITION OF SPECIMEN:  PATHOLOGY  COUNTS:  YES  TOURNIQUET:  15 min at 225 mmHg  DICTATION: .Other Dictation: Dictation Number [id number]  PLAN OF CARE: Admit to inpatient   PATIENT DISPOSITION:  PACU - guarded condition.   Delay start of Pharmacological VTE agent (>24hrs) due to surgical blood loss or risk of bleeding: no

## 2021-08-19 NOTE — Anesthesia Procedure Notes (Addendum)
Procedure Name: Intubation Date/Time: 08/19/2021 6:47 PM Performed by: Thelma Comp, CRNA Pre-anesthesia Checklist: Patient identified, Emergency Drugs available, Suction available and Patient being monitored Patient Re-evaluated:Patient Re-evaluated prior to induction Oxygen Delivery Method: Circle System Utilized Preoxygenation: Pre-oxygenation with 100% oxygen Induction Type: IV induction and Rapid sequence Ventilation: Mask ventilation without difficulty and Two handed mask ventilation required Laryngoscope Size: Mac and 4 Grade View: Grade I Tube type: Oral Tube size: 7.5 mm Number of attempts: 1 Airway Equipment and Method: Stylet Placement Confirmation: ETT inserted through vocal cords under direct vision, positive ETCO2 and breath sounds checked- equal and bilateral Secured at: 23 cm Tube secured with: Tape Dental Injury: Teeth and Oropharynx as per pre-operative assessment

## 2021-08-19 NOTE — Progress Notes (Signed)
SLP Cancellation Note  Patient Details Name: Leslie [name] MRN: [id number] DOB: 1946-08-29   Cancelled treatment:       Reason Eval/Treat Not Completed: Patient at procedure or test/unavailable. Pt npo for potential procedure    Mateen Franssen, Katherene Ponto 08/19/2021, 10:11 AM

## 2021-08-19 NOTE — Transfer of Care (Signed)
Immediate Anesthesia Transfer of Care Note  Patient: Leslie [name]  Procedure(s) Performed: IRRIGATION AND DEBRIDEMENT KNEE (Left)  Patient Location: PACU  Anesthesia Type:General  Level of Consciousness: drowsy, pateint uncooperative and confused  Airway & Oxygen Therapy: Patient Spontanous Breathing and Patient connected to face mask oxygen  Post-op Assessment: Report given to RN and Post -op Vital signs reviewed and stable  Post vital signs: Reviewed and stable  Last Vitals:  Vitals Value Taken Time  BP 147/49    Temp 97   Pulse 100   Resp 18   SpO2 94     Last Pain:  Vitals:   08/19/21 1828  TempSrc: Axillary         Complications: No notable events documented.

## 2021-08-19 NOTE — Progress Notes (Signed)
Office Visit Note   Patient: Leslie [name]           Date of Birth: 1945-12-08           MRN: [id number] Visit Date: 08/15/2021              Requested by: [facility] [address] [place],  [place] [address] PCP: [facility] [place]  Chief Complaint  Patient presents with   Right Leg - Follow-up    BKA 03/2018      HPI: Patient is a 75 year old gentleman who is seen in follow-up for bilateral transtibial amputations.  Patient was last seen in the office 3 years ago.  Patient states he has difficulty with range of motion of his left knee.  Patient states he has had a several week history of acute pain in his left below-knee amputation.  Assessment & Plan: Visit Diagnoses:  1. S/P bilateral BKA (below knee amputation) ([place])     Plan: Patient has a follow-up appoint with Dr. Gwenlyn Found.  Follow-Up Instructions: Return if symptoms worsen or fail to improve.   Ortho Exam  Patient is alert, oriented, no adenopathy, well-dressed, normal affect, normal respiratory effort. Examination patient's left residual limb has slow capillary refill of about 5 seconds.  His leg is cold.  There are no ulcers no cellulitis no signs of infection.  Patient appears symptomatic from an acute vascular event with a history of severe peripheral vascular disease.  Imaging: CT HEAD WO CONTRAST (5MM)  Result Date: 08/18/2021 CLINICAL DATA:  Delirium, altered mental status. EXAM: CT HEAD WITHOUT CONTRAST TECHNIQUE: Contiguous axial images were obtained from the base of the skull through the vertex without intravenous contrast. COMPARISON:  CT head dated 12/13/2020. FINDINGS: Brain: No evidence of acute infarction, hemorrhage, hydrocephalus, extra-axial collection or mass lesion/mass effect. There is mild cerebral volume loss with associated ex vacuo dilatation. Periventricular white matter hypoattenuation likely represents chronic small vessel  ischemic disease. Vascular: There are vascular calcifications in the carotid siphons. Skull: Normal. Negative for fracture or focal lesion. Sinuses/Orbits: There is a small left mastoid effusion. There is mild right ethmoid sinus disease. Other: None. IMPRESSION: No acute intracranial process. Electronically Signed   By: Zerita Boers M.D.   On: 08/18/2021 18:12   US Abdomen Complete  Result Date: 08/19/2021 CLINICAL DATA:  Abdominal pain EXAM: ABDOMEN ULTRASOUND COMPLETE COMPARISON:  None. FINDINGS: Gallbladder: No gallstones or wall thickening visualized. Minimal sludge. No sonographic Murphy sign noted by sonographer. Common bile duct: Diameter: 6 mm Liver: No focal lesion identified. Within normal limits in parenchymal echogenicity. Portal vein is patent on color Doppler imaging with normal direction of blood flow towards the liver. IVC: No abnormality visualized. Pancreas: Not visualized due to overlying bowel gas. Spleen: Size and appearance within normal limits. Adjacent splenule. Right Kidney: Length: 9.4 cm. Echogenicity within normal limits. No mass or hydronephrosis visualized. Left Kidney: Length: 11.1 cm. Echogenicity within normal limits. No mass or hydronephrosis visualized. Abdominal aorta: No aneurysm visualized. Other findings: None. IMPRESSION: 1. Minimal sludge within the gallbladder. No evidence of cholecystitis. 2. Otherwise unremarkable ultrasound of the abdomen. Electronically Signed   By: Merilyn Baba M.D.   On: 08/19/2021 00:26   DG Chest Portable 1 View  Result Date: 08/18/2021 CLINICAL DATA:  Altered mental status.  Hypoglycemia. EXAM: PORTABLE CHEST 1 VIEW COMPARISON:  Chest radiograph 03/27/2018.  Chest CT 01/03/2019 FINDINGS: Upper normal heart size, stable from prior  exam. Unchanged mediastinal contours. Aortic atherosclerosis and tortuosity. Scattered calcified pleural plaques, better demonstrated on prior CT. No acute airspace disease. No pulmonary edema, pleural  effusion, or pneumothorax. No acute osseous abnormalities are seen. IMPRESSION: No acute findings. Aortic Atherosclerosis (ICD10-I70.0). Electronically Signed   By: Keith Rake M.D.   On: 08/18/2021 19:15   DG Knee Left Port  Result Date: 08/18/2021 CLINICAL DATA:  Sepsis, possible infection. EXAM: PORTABLE LEFT KNEE - 1-2 VIEW COMPARISON:  Included portions from femur radiograph 08/12/2021 FINDINGS: Below the knee amputation. The resection margins are smooth. No erosion or periosteal reaction. The knee joint is intact. Minimal degenerative change. No erosion or bony destruction. Advanced vascular calcifications. Mild generalized soft tissue edema. No soft tissue air. Similar density adjacent to the distal femur in the lateral soft tissues, well-defined margins and of doubtful clinical significance. IMPRESSION: 1. Below the knee amputation. No radiographic findings of osteomyelitis. 2. Mild soft tissue edema. Electronically Signed   By: Keith Rake M.D.   On: 08/18/2021 19:34   No images are attached to the encounter.  Labs: Lab Results  Component Value Date   HGBA1C 5.8 (H) 08/19/2021   HGBA1C 7.1 (H) 02/19/2018   HGBA1C 7.2 (H) 03/27/2016   ESRSEDRATE 90 (H) 03/27/2016   CRP 23.4 (H) 03/27/2016   LABURIC 4.8 08/09/2012   REPTSTATUS PENDING 08/18/2021   CULT  08/18/2021    NO GROWTH < 12 HOURS Performed at [facility], [place] [address], [place], [place] [address]      Lab Results  Component Value Date   ALBUMIN 2.2 (L) 08/19/2021   ALBUMIN 2.0 (L) 08/18/2021   ALBUMIN 1.9 (L) 03/27/2018   PREALBUMIN 13.8 (L) 03/27/2016    No results found for: MG No results found for: VD25OH  Lab Results  Component Value Date   PREALBUMIN 13.8 (L) 03/27/2016   CBC EXTENDED Latest Ref Rng & Units 08/19/2021 08/18/2021 08/18/2021  WBC 4.0 - 10.5 K/uL 29.4(H) - 27.1(H)  RBC 4.22 - 5.81 MIL/uL 3.46(L) - 3.33(L)  HGB 13.0 - 17.0 g/dL 9.6(L) 8.8(L) 9.2(L)  HCT 39.0 - 52.0 %  29.7(L) 26.0(L) 28.2(L)  PLT 150 - 400 K/uL 453(H) - 403(H)  NEUTROABS 1.7 - 7.7 K/uL - - 22.3(H)  LYMPHSABS 0.7 - 4.0 K/uL - - 1.1     There is no height or weight on file to calculate BMI.  Orders:  No orders of the defined types were placed in this encounter.  No orders of the defined types were placed in this encounter.    Procedures: No procedures performed  Clinical Data: No additional findings.  ROS:  All other systems negative, except as noted in the HPI. Review of Systems  Objective: Vital Signs: There were no vitals taken for this visit.  Specialty Comments:  No specialty comments available.  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 ([place]) 01/14/2018   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 ([place]) 01/14/2018   S/P bilateral BKA (below knee amputation) ([place]) 04/11/2016   History of tobacco abuse 03/31/2016   Osteomyelitis of left foot ([place])    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   Septic arthritis of left foot ([place]) 03/27/2016   Solitary pulmonary nodule 08/14/2015   Precordial chest pain 08/13/2015   Peripheral arterial disease ([place]) 04/18/2015   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   Right anterior knee pain 11/19/2012   Toenail deformity 01/16/2012   Gout    Diabetic neuropathy ([place]) 03/31/2008   ALCOHOLISM 03/31/2008    Rosacea 03/31/2008   GERD 01/31/2008   COPD (chronic obstructive pulmonary disease) ([place]) 38/45/3646   DM W/COMPLICATION NOS, TYPE II 05/08/2007   OBESITY 05/08/2007   HTN (hypertension) 05/08/2007   HERNIATED [place] 05/08/2007   HISTORY OF ASBESTOS EXPOSURE 05/08/2007   Chronic lower back pain on narcotics  06/16/2005   HLD (hyperlipidemia) 03/22/2002   Past Medical History:  Diagnosis Date   Anxiety    Panic attack - 10  years aago- ? panic    Asthma    Cellulitis of left foot 2016   Healed and recurred 01/2016   Cellulitis of right foot 06/30/2012   Chronic lower back pain 07/01/2012   COPD (chronic obstructive pulmonary disease) ([place])    Uses nebulizers at home. Former smoker, current chewing tobacco. 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   GERD (gastroesophageal reflux disease)    Gouty arthritis 07/01/2012   Not attacks in ~5 yrs (03/2016)   Hepatitis 1967   "in jail when I got it; dr said it was pretty bad; quaranteened X 30d"   Hyperlipidemia    Hypertension    Lung nodule, solitary 07/2015   Due for 1-yr recheck in 07/2017   Peripheral arterial disease ([place])    critical limb ischemia   Peripheral neuropathy 07/01/2012   Seasonal allergies    Skin growth 07/01/2012   right side of nares; "been on there 20 years"   Substance abuse ([place])    Type II diabetes mellitus ([place]) 2002   Diagnosed in 2002    Family History  Problem Relation Age of Onset   Diabetes Sister    Hypertension Mother     Past Surgical History:  Procedure Laterality Date   AMPUTATION Left 03/28/2016   Procedure: AMPUTATION left foot 4th and 5 th ray;  Surgeon: Newt Minion, MD;  Location: [facility];  Service: Orthopedics;  Laterality: Left;   AMPUTATION Left 04/11/2016   Procedure: LEFT BELOW KNEE AMPUTATION;  Surgeon: Newt Minion, MD;  Location: [facility];  Service: Orthopedics;  Laterality: Left;   AMPUTATION Right 01/22/2018   Procedure: RIGHT FOOT 3RD RAY AMPUTATION;  Surgeon: Newt Minion, MD;  Location: [facility];  Service:  Orthopedics;  Laterality: Right;   AMPUTATION Right 02/19/2018   Procedure: RIGHT TRANSMETATARSAL AMPUTATION;  Surgeon: Newt Minion, MD;  Location: [facility];  Service: Orthopedics;  Laterality: Right;   AMPUTATION Right 03/28/2018   Procedure: RIGHT BELOW KNEE AMPUTATION;  Surgeon: Newt Minion, MD;  Location: [facility];  Service: Orthopedics;  Laterality: Right;   LEG AMPUTATION BELOW KNEE Left 04/11/2016   LOWER EXTREMITY ANGIOGRAM N/A 04/18/2013   Procedure: LOWER EXTREMITY ANGIOGRAM;  Surgeon: Sherren Mocha, MD;  Location: [facility] CATH LAB;  Service: Cardiovascular;  Laterality: N/A;   LOWER EXTREMITY ANGIOGRAM  08/13/2015   bilateral iliac    PERIPHERAL  VASCULAR CATHETERIZATION Bilateral 08/13/2015   Procedure: Lower Extremity Angiography;  Surgeon: Lorretta Harp, MD;  Location: [facility] CV LAB;  Service: Cardiovascular;  Laterality: Bilateral;   PERIPHERAL VASCULAR CATHETERIZATION N/A 08/13/2015   Procedure: Abdominal Aortogram;  Surgeon: Lorretta Harp, MD;  Location: [facility] CV LAB;  Service: Cardiovascular;  Laterality: N/A;   TOE AMPUTATION Right 01/22/2018   3RD TOE RIGHT FOOT   Social History   Occupational History   Occupation: disabled  Tobacco Use   Smoking status: Former    Packs/day: 2.00    Years: 40.00    Pack years: 80.00    Types: Cigarettes    Quit date: 01/13/2002    Years since quitting: 19.6   Smokeless tobacco: Current    Types: Chew   Tobacco comments:    chews a little tobacco  Vaping Use   Vaping Use: Never used  Substance and Sexual Activity   Alcohol use: Not Currently    Comment: 03/03/18: "Used to drink alot. No alcohol in a long time."   Drug use: No    Types: Marijuana    Comment: 03/27/2016 "last marijuana was maybe in 2013"   Sexual activity: Never

## 2021-08-20 ENCOUNTER — Inpatient Hospital Stay (HOSPITAL_COMMUNITY): Payer: Medicare (Managed Care)

## 2021-08-20 ENCOUNTER — Encounter (HOSPITAL_COMMUNITY): Payer: Self-pay | Admitting: Orthopedic Surgery

## 2021-08-20 DIAGNOSIS — R4182 Altered mental status, unspecified: Secondary | ICD-10-CM | POA: Diagnosis not present

## 2021-08-20 DIAGNOSIS — G9340 Encephalopathy, unspecified: Secondary | ICD-10-CM | POA: Diagnosis not present

## 2021-08-20 DIAGNOSIS — R9431 Abnormal electrocardiogram [ECG] [EKG]: Secondary | ICD-10-CM

## 2021-08-20 LAB — BASIC METABOLIC PANEL
Anion gap: 17 — ABNORMAL HIGH (ref 5–15)
Anion gap: 15 (ref 5–15)
Anion gap: 15 (ref 5–15)
Anion gap: 16 — ABNORMAL HIGH (ref 5–15)
BUN: 80 mg/dL — ABNORMAL HIGH (ref 8–23)
BUN: 77 mg/dL — ABNORMAL HIGH (ref 8–23)
BUN: 74 mg/dL — ABNORMAL HIGH (ref 8–23)
BUN: 69 mg/dL — ABNORMAL HIGH (ref 8–23)
CO2: 16 mmol/L — ABNORMAL LOW (ref 22–32)
CO2: 19 mmol/L — ABNORMAL LOW (ref 22–32)
CO2: 20 mmol/L — ABNORMAL LOW (ref 22–32)
CO2: 19 mmol/L — ABNORMAL LOW (ref 22–32)
Calcium: 8 mg/dL — ABNORMAL LOW (ref 8.9–10.3)
Calcium: 8.3 mg/dL — ABNORMAL LOW (ref 8.9–10.3)
Calcium: 8.4 mg/dL — ABNORMAL LOW (ref 8.9–10.3)
Calcium: 8.6 mg/dL — ABNORMAL LOW (ref 8.9–10.3)
Chloride: 99 mmol/L (ref 98–111)
Chloride: 99 mmol/L (ref 98–111)
Chloride: 98 mmol/L (ref 98–111)
Chloride: 101 mmol/L (ref 98–111)
Creatinine, Ser: 5.23 mg/dL — ABNORMAL HIGH (ref 0.61–1.24)
Creatinine, Ser: 5 mg/dL — ABNORMAL HIGH (ref 0.61–1.24)
Creatinine, Ser: 4.71 mg/dL — ABNORMAL HIGH (ref 0.61–1.24)
Creatinine, Ser: 4.09 mg/dL — ABNORMAL HIGH (ref 0.61–1.24)
GFR, Estimated: 11 mL/min — ABNORMAL LOW (ref >60)
GFR, Estimated: 11 mL/min — ABNORMAL LOW (ref >60)
GFR, Estimated: 12 mL/min — ABNORMAL LOW (ref >60)
GFR, Estimated: 14 mL/min — ABNORMAL LOW (ref >60)
Glucose, Bld: 123 mg/dL — ABNORMAL HIGH (ref 70–99)
Glucose, Bld: 120 mg/dL — ABNORMAL HIGH (ref 70–99)
Glucose, Bld: 122 mg/dL — ABNORMAL HIGH (ref 70–99)
Glucose, Bld: 127 mg/dL — ABNORMAL HIGH (ref 70–99)
Potassium: 4.6 mmol/L (ref 3.5–5.1)
Potassium: 4.2 mmol/L (ref 3.5–5.1)
Potassium: 4.4 mmol/L (ref 3.5–5.1)
Potassium: 4.3 mmol/L (ref 3.5–5.1)
Sodium: 132 mmol/L — ABNORMAL LOW (ref 135–145)
Sodium: 133 mmol/L — ABNORMAL LOW (ref 135–145)
Sodium: 133 mmol/L — ABNORMAL LOW (ref 135–145)
Sodium: 136 mmol/L (ref 135–145)

## 2021-08-20 LAB — GLUCOSE, CAPILLARY
Glucose-Capillary: 110 mg/dL — ABNORMAL HIGH (ref 70–99)
Glucose-Capillary: 110 mg/dL — ABNORMAL HIGH (ref 70–99)
Glucose-Capillary: 126 mg/dL — ABNORMAL HIGH (ref 70–99)
Glucose-Capillary: 120 mg/dL — ABNORMAL HIGH (ref 70–99)
Glucose-Capillary: 119 mg/dL — ABNORMAL HIGH (ref 70–99)
Glucose-Capillary: 116 mg/dL — ABNORMAL HIGH (ref 70–99)
Glucose-Capillary: 125 mg/dL — ABNORMAL HIGH (ref 70–99)

## 2021-08-20 LAB — ECHOCARDIOGRAM COMPLETE
Area-P 1/2: 2.76 cm2
Calc EF: 58.4 %
MV VTI: 1.77 cm2
S' Lateral: 2.7 cm
Single Plane A2C EF: 61 %
Single Plane A4C EF: 56.1 %
Weight: 3238.12 oz

## 2021-08-20 LAB — CBC
HCT: 27.4 % — ABNORMAL LOW (ref 39.0–52.0)
Hemoglobin: 8.9 g/dL — ABNORMAL LOW (ref 13.0–17.0)
MCH: 28.1 pg (ref 26.0–34.0)
MCHC: 32.5 g/dL (ref 30.0–36.0)
MCV: 86.4 fL (ref 80.0–100.0)
Platelets: 381 10*3/uL (ref 150–400)
RBC: 3.17 MIL/uL — ABNORMAL LOW (ref 4.22–5.81)
RDW: 16.6 % — ABNORMAL HIGH (ref 11.5–15.5)
WBC: 18 10*3/uL — ABNORMAL HIGH (ref 4.0–10.5)
nRBC: 0.2 % (ref 0.0–0.2)

## 2021-08-20 LAB — URINE CULTURE: Culture: NO GROWTH

## 2021-08-20 MED ORDER — VANCOMYCIN HCL 750 MG/150ML IV SOLN
750.0000 mg | INTRAVENOUS | Status: DC
  Administered 2021-08-20: 750 mg via INTRAVENOUS
  Filled 2021-08-20: qty 150

## 2021-08-20 MED ORDER — ALLOPURINOL 100 MG PO TABS: 50.0000 mg | ORAL_TABLET | Freq: Every day | ORAL | Status: DC

## 2021-08-20 MED ORDER — LACTATED RINGERS IV SOLN: INTRAVENOUS | Status: AC

## 2021-08-20 NOTE — Evaluation (Signed)
Clinical/Bedside Swallow Evaluation Patient Details  Name: Leslie [name] MRN: [id number] Date of Birth: 1946-05-29  Today's Date: 08/20/2021 Time: SLP Start Time (ACUTE ONLY): 8101 SLP Stop Time (ACUTE ONLY): 1440 SLP Time Calculation (min) (ACUTE ONLY): 28 min  Past Medical History:  Past Medical History:  Diagnosis Date   Anxiety    Panic attack - 10  years aago- ? panic    Asthma    Cellulitis of left foot 2016   Healed and recurred 01/2016   Cellulitis of right foot 06/30/2012   Chronic lower back pain 07/01/2012   COPD (chronic obstructive pulmonary disease) ([place])    Uses nebulizers at home. Former smoker, current chewing tobacco. 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   GERD (gastroesophageal reflux disease)    Gouty arthritis 07/01/2012   Not attacks in ~5 yrs (03/2016)   Hepatitis 1967   "in jail when I got it; dr said it was pretty bad; quaranteened X 30d"   Hyperlipidemia    Hypertension    Lung nodule, solitary 07/2015   Due for 1-yr recheck in 07/2017   Peripheral arterial disease ([place])    critical limb ischemia   Peripheral neuropathy 07/01/2012   Seasonal allergies    Skin growth 07/01/2012   right side of nares; "been on there 20 years"   Substance abuse ([place])    Type II diabetes mellitus ([place]) 2002   Diagnosed in 2002   Past Surgical History:  Past Surgical History:  Procedure Laterality Date   AMPUTATION Left 03/28/2016   Procedure: AMPUTATION left foot 4th and 5 th ray;  Surgeon: Newt Minion, MD;  Location: [place];  Service: Orthopedics;  Laterality: Left;   AMPUTATION Left 04/11/2016   Procedure: LEFT BELOW KNEE AMPUTATION;  Surgeon: Newt Minion, MD;  Location: [place];  Service: Orthopedics;  Laterality: Left;   AMPUTATION Right 01/22/2018   Procedure: RIGHT FOOT 3RD RAY AMPUTATION;  Surgeon: Newt Minion, MD;  Location: [place];  Service: Orthopedics;  Laterality: Right;   AMPUTATION Right 02/19/2018   Procedure: RIGHT TRANSMETATARSAL AMPUTATION;  Surgeon: Newt Minion, MD;   Location: [place];  Service: Orthopedics;  Laterality: Right;   AMPUTATION Right 03/28/2018   Procedure: RIGHT BELOW KNEE AMPUTATION;  Surgeon: Newt Minion, MD;  Location: [place];  Service: Orthopedics;  Laterality: Right;   I & D EXTREMITY Left 08/19/2021   Procedure: IRRIGATION AND DEBRIDEMENT LEFT KNEE;  Surgeon: Paralee Cancel, MD;  Location: [place];  Service: Orthopedics;  Laterality: Left;   LEG AMPUTATION BELOW KNEE Left 04/11/2016   LOWER EXTREMITY ANGIOGRAM N/A 04/18/2013   Procedure: LOWER EXTREMITY ANGIOGRAM;  Surgeon: Sherren Mocha, MD;  Location: [facility] CATH LAB;  Service: Cardiovascular;  Laterality: N/A;   LOWER EXTREMITY ANGIOGRAM  08/13/2015   bilateral iliac    PERIPHERAL VASCULAR CATHETERIZATION Bilateral 08/13/2015   Procedure: Lower Extremity Angiography;  Surgeon: Lorretta Harp, MD;  Location: [place] CV LAB;  Service: Cardiovascular;  Laterality: Bilateral;   PERIPHERAL VASCULAR CATHETERIZATION N/A 08/13/2015   Procedure: Abdominal Aortogram;  Surgeon: Lorretta Harp, MD;  Location: [place] CV LAB;  Service: Cardiovascular;  Laterality: N/A;   TOE AMPUTATION Right 01/22/2018   3RD TOE RIGHT FOOT   HPI:  75 yo person living with HTN, HLD, T2DM, COPD, PVD s/p bilateral BKAs, and alcohol use disorder here for altered mental status, found down at home for an unclear amount of time although a neighbor reports not hearing from him in two days. Taken  to OR 10/25 for septic native left knee s/p irrigation and debridement.    Assessment / Plan / Recommendation  Clinical Impression  Bedside swallow evaluation limited by patient mentation. Pt has had persistent lethargy, on some sedating meds per chart review. Pt did open eyes briefly during diligent oral care by SLP but overall sustained attention was poor. Pt with open mouth posture with mouth breathing noted. Significant xerostomia and copious dried secretions noted in oral cavity and velar region. SLP provided diligent oral  care to moisten and clean mucosa, extracted dried thick/ brown/yellow secretions, and set up oral suction. Imperative to continue oral care QID in setting of prolonged NPO status, discussed with RN. Following diligent oral care, trialed single ice chip via spoon. Pt with no passive attempts to propel ice chips which was subsequently extracted from oral cavity via suction. No further PO trials given due to poor pt tolerance. Recommend continue NPO, if mentation does not improve consider short term alternative means for nutritional support. SLP will continue to closely monitor. SLP Visit Diagnosis: Dysphagia, oral phase (R13.11);Dysphagia, unspecified (R13.10)    Aspiration Risk  Moderate aspiration risk;Risk for inadequate nutrition/hydration    Diet Recommendation   NPO, pt may need short term alternative nutrition if mentation does not improve  Medication Administration: Via alternative means    Other  Recommendations Oral Care Recommendations: Oral care QID Other Recommendations: Have oral suction available    Recommendations for follow up therapy are one component of a multi-disciplinary discharge planning process, led by the attending physician.  Recommendations may be updated based on patient status, additional functional criteria and insurance authorization.  Follow up Recommendations Other (comment) (TBD)      Frequency and Duration min 2x/week  2 weeks       Prognosis Prognosis for Safe Diet Advancement: Fair Barriers to Reach Goals: Cognitive deficits;Severity of deficits      Swallow Study   General Date of Onset: 08/18/21 HPI: 75 yo person living with HTN, HLD, T2DM, COPD, PVD s/p bilateral BKAs, and alcohol use disorder here for altered mental status, found down at home for an unclear amount of time although a neighbor reports not hearing from him in two days. Taken to OR 10/25 for septic native left knee s/p irrigation and debridement. Type of Study: Bedside Swallow  Evaluation Previous Swallow Assessment: none on file Diet Prior to this Study: NPO Temperature Spikes Noted: No Respiratory Status: Nasal cannula History of Recent Intubation: No Behavior/Cognition: Lethargic/Drowsy;Doesn't follow directions Oral Cavity Assessment: Dry;Dried secretions;Excessive secretions Oral Care Completed by SLP: Yes Oral Cavity - Dentition: Edentulous Vision: Impaired for self-feeding Self-Feeding Abilities: Total assist Patient Positioning: Upright in bed Baseline Vocal Quality: Not observed Volitional Cough: Cognitively unable to elicit Volitional Swallow: Unable to elicit    Oral/Motor/Sensory Function Overall Oral Motor/Sensory Function:  (unable to assess due to altered mental status)   Ice Chips Ice chips: Impaired Presentation: Spoon Oral Phase Impairments: Poor awareness of bolus;Reduced labial seal Oral Phase Functional Implications: Oral holding (extracted by SLP following no passive attempts to propel to pharynx) Pharyngeal Phase Impairments: Unable to trigger swallow   Thin Liquid Thin Liquid: Not tested    Nectar Thick Nectar Thick Liquid: Not tested   Honey Thick Honey Thick Liquid: Not tested   Puree Puree: Not tested   Solid     Solid: Not tested     Hayden Rasmussen MA, CCC-SLP Acute Rehabilitation Services   08/20/2021,2:54 PM

## 2021-08-20 NOTE — Op Note (Signed)
NAME: Leslie [name], Leslie [name] MEDICAL RECORD NO: [id number] ACCOUNT NO: [id number] DATE OF BIRTH: Mar 20, 1946 FACILITY: [facility] LOCATION: [id number] PHYSICIAN: Pietro Cassis. Alvan Dame, MD  Operative Report   DATE OF PROCEDURE: 08/19/2021  PREOPERATIVE DIAGNOSIS:  Septic native left knee joint.  POSTOPERATIVE DIAGNOSIS:  Septic native left knee joint.  PROCEDURE:  Open arthrotomy with excisional and non-excisional debridement of left knee.  Please see details of the operative note for description of the debridement.  SURGEON:  Pietro Cassis. Alvan Dame, MD  ASSISTANT:  Costella Hatcher, PA-C.  Note that Ms. Leslie [name] was present for the entirety of the case from preoperative positioning, perioperative management of the operative extremity and general facilitation of the case.  ANESTHESIA:  General.  ESTIMATED BLOOD LOSS:  Minimal.  Tourniquet was up at for 15 minutes at 250 mmHg.  DRAINS:  One medium Hemovac was placed deep into the knee.  INDICATIONS:  The patient is a 75 year old male with a significant medical history including history of bilateral below-the-knee amputations.  Apparently in conversation with a friend there were some concerns with wound for which he is being followed and  monitored.  There were concerns about the left below-the-knee amputation stump having some erythema.  He was not seen or heard from for a couple days and was eventually found at his place of living.  He was brought to the Emergency Room and found to be  septic.  He had a significantly elevated white blood cell count with a painful left knee.  He was very disoriented and confused.  Aspiration attempt in the ER revealed purulence.  Orthopedics was consulted for management.  He was unable to participate in  the conversations, felt this was an emergent/urgent need to debride his knee due to the infection and his sepsis.  DESCRIPTION OF PROCEDURE:  The patient was brought to the operative theater.  Once adequate anesthesia, preoperative  antibiotics were already administered including vancomycin a dose at his initial presentation in the Emergency Room as well as cefepime.   He was positioned supine with a left thigh tourniquet placed.  The left lower extremity was then prepped and draped in sterile fashion.  A timeout was performed identifying the patient, planned procedure, and extremity, I elected to perform an open  arthrotomy for more complete visualization of the joint as well as collection of more fluid as well as a non-excisional debridement.  An approximate 3-4 inch incision was made over his knee.  A median arthrotomy was made encountering a large purulent  effusion.  We aspirated this with a syringe amounting to about 8 mL of purulence.  This was sent to pathology for Gram stain, culture and evaluation to help steer antibiotic selection.  At this point, I sharply excised nonviable synovium in the knee with  a combination of the scalpel and the Bovie.  Once the debridement was carried out, I used pulse lavage and irrigated the knee out with 3 liters of normal saline solution.  At the completion of the excisional and non-excisional debridement, the knee had  a relatively normal appearance other than some evidence of cartilage erosion on the trochlear region medially.  I placed a medium Hemovac drain deep.  We then reapproximated the extensor mechanism using #1 PDS suture.  The remainder of the wound was  closed with 2-0 Vicryl and a running Monocryl stitch.  The primary wound was clean, dry and dressed sterilely with surgical glue and Aquacel dressing.  The drain site was dressed with Xeroform and Mepilex dressing.  The knee was wrapped in an Ace wrap.   The patient was then brought to the recovery room in a guarded condition related to his underlying sepsis.  POSTOPERATIVE PLAN:  He will be managed through medicine.  We will follow his drain output and remove his drain when it is minimal.  Hopefully, antibiotics can be steered  based on culture results through medicine and/or Infectious Disease.   PUS D: 08/19/2021 7:36:16 pm T: 08/20/2021 5:25:00 am  JOB: [id number]/ [id number]

## 2021-08-20 NOTE — Progress Notes (Signed)
SLP Cancellation Note  Patient Details Name: Leslie [name] MRN: [id number] DOB: 1946-08-13   Cancelled treatment:       Reason Eval/Treat Not Completed: Fatigue/lethargy limiting ability to participate. Spoke to RN and PT who both report pt with poor alertness this am. Will f/u, as schedule allows, for completion of bedside swallow eval.    Ellwood Dense, [place], [place] Office Number: [place] 08/20/2021, 10:42 AM

## 2021-08-20 NOTE — Progress Notes (Signed)
Pt. Arrives from PACU on ICU bed. CCMD notified, MD orders reviewed and carried out. Pt. Answers to name but will not answer questions. Deval drain intact with scant serousanguinous drainage noted. Will continue to monitor closely.

## 2021-08-20 NOTE — Progress Notes (Signed)
Subjective: 1 Day Post-Op Procedure(s) (LRB): IRRIGATION AND DEBRIDEMENT LEFT KNEE (Left) Patient reports pain as mild.   Patient seen in rounds for Dr. Alvan Dame. Patient is well, and has had no acute events overnight. He is sleeping on exam this morning and did not awake to his name. When I removed his hemovac, he did wake up to this but did not interact with me.   Objective: Vital signs in last 24 hours: Temp:  [97 F (36.1 C)-99 F (37.2 C)] 97.8 F (36.6 C) (10/25 0345) Pulse Rate:  [63-101] 75 (10/25 0345) Resp:  [10-20] 14 (10/25 0345) BP: (104-178)/(48-132) 127/64 (10/25 0345) SpO2:  [92 %-100 %] 100 % (10/25 0345) Weight:  [91.8 kg] 91.8 kg (10/25 0345)  Intake/Output from previous day:  Intake/Output Summary (Last 24 hours) at 08/20/2021 0719 Last data filed at 08/20/2021 0348 Gross per 24 hour  Intake 2032 ml  Output 605 ml  Net 1427 ml     Intake/Output this shift: No intake/output data recorded.  Labs: Recent Labs    08/18/21 1736 08/18/21 2323 08/19/21 0529 08/20/21 0147  HGB 9.2* 8.8* 9.6* 8.9*   Recent Labs    08/19/21 0529 08/20/21 0147  WBC 29.4* 18.0*  RBC 3.46* 3.17*  HCT 29.7* 27.4*  PLT 453* 381   Recent Labs    08/20/21 0147 08/20/21 0519  NA 132* 133*  K 4.6 4.2  CL 99 99  CO2 16* 19*  BUN 80* 77*  CREATININE 5.23* 5.00*  GLUCOSE 123* 120*  CALCIUM 8.0* 8.3*   Recent Labs    08/18/21 1736 08/19/21 0529  INR 1.2 1.4*    Exam: General - Patient arouses to painful stimulus (removing drain) Extremity -  S/p bilateral BKA Hemovac drain removed without difficulty Skin warm and dry Dressing - dressing C/D/I, aquacel peeling up at the top, will change to longer dressing tomorrow.  Motor Function - moved legs to stimulus  Past Medical History:  Diagnosis Date   Anxiety    Panic attack - 10  years aago- ? panic    Asthma    Cellulitis of left foot 2016   Healed and recurred 01/2016   Cellulitis of right foot 06/30/2012    Chronic lower back pain 07/01/2012   COPD (chronic obstructive pulmonary disease) ([place])    Uses nebulizers at home. Former smoker, current chewing tobacco. 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   GERD (gastroesophageal reflux disease)    Gouty arthritis 07/01/2012   Not attacks in ~5 yrs (03/2016)   Hepatitis 1967   "in jail when I got it; dr said it was pretty bad; quaranteened X 30d"   Hyperlipidemia    Hypertension    Lung nodule, solitary 07/2015   Due for 1-yr recheck in 07/2017   Peripheral arterial disease ([place])    critical limb ischemia   Peripheral neuropathy 07/01/2012   Seasonal allergies    Skin growth 07/01/2012   right side of nares; "been on there 20 years"   Substance abuse ([place])    Type II diabetes mellitus ([place]) 2002   Diagnosed in 2002    Assessment/Plan: 1 Day Post-Op Procedure(s) (LRB): IRRIGATION AND DEBRIDEMENT LEFT KNEE (Left) Active Problems:   Altered mental status   S/P debridement  Estimated body mass index is 32.67 kg/m as calculated from the following:   Height as of 06/21/18: 5\' 6"  (1.676 m).   Weight as of this encounter: 91.8 kg.   DVT Prophylaxis - Aspirin  Hemovac drain removed Mepilex dressing left  in place at drain site  Gram stain shows few gram positive cocci - awaiting final cultures  Recommend medicine vs ID input on antibiotic regimen   Griffith Citron, PA-C Orthopedic Surgery [phone] 08/20/2021, 7:19 AM

## 2021-08-20 NOTE — Progress Notes (Signed)
PT Cancellation Note  Patient Details Name: Leslie [name] MRN: [id number] DOB: 13-May-1946   Cancelled Treatment:    Reason Eval/Treat Not Completed: Fatigue/lethargy limiting ability to participate, unable to arouse patient beyond one eye opening then returning to snoring; RN aware. Will follow-up for PT Evaluation as schedule permits.  Mabeline Caras, PT, DPT Acute Rehabilitation Services  Pager [phone] Office [place] 08/20/2021, 8:38 AM

## 2021-08-20 NOTE — Progress Notes (Signed)
Subjective:  ended up going to the OR for a septic knee last night - is hemodynamically stable and had at least 600 of UOP-  BUN and crt trending slightly better -  opens eyes to aggressive stimuli but still mostly obtunded   Objective Vital signs in last 24 hours: Vitals:   08/19/21 2041 08/19/21 2306 08/20/21 0345 08/20/21 0749  BP: (!) 178/60 (!) 167/74 127/64 (!) 146/61  Pulse: 100 100 75 85  Resp: 18 14 14 14   Temp: 97.7 F (36.5 C) 97.7 F (36.5 C) 97.8 F (36.6 C) 98.6 F (37 C)  TempSrc: Oral Axillary Axillary Axillary  SpO2: 96% 100% 100%   Weight: 91.8 kg  91.8 kg    Weight change: 1.67 kg  Intake/Output Summary (Last 24 hours) at 08/20/2021 0854 Last data filed at 08/20/2021 0348 Gross per 24 hour  Intake 2032 ml  Output 605 ml  Net 1427 ml    Assessment/Plan: 75 year old WM with altered MS and also with AKI 1.Renal- if crt was 1.5 in June ( reported in the chart but I have not been able to verify) -  this is either an acute or subacute presentation.   His U/A is bland so does not seem like GN-  u/s does not show obstruction.  It would fit with a pre renal insult-  possibly a low BP episode on his ARB that gave him an AKI.  We dont usually see this much of a change in MS for a BUN in the 70's, or a sodium of 126-  which has corrected to 133.  I wonder if the AKI in addition to the meds he is taking-  specifically lyrica, narcotics and klonopin are what is responsible for some of his AMS. There are no acute indications for dialysis -  hold losartan and other BP meds as well as sedating agents and follow 2. Hypertension/volume  - he does not seen overloaded-  dark urine.  I am OK with IVF for now-  is on every 4 hour BMPs.  Has been put back on amlodipine/hydralazine and lasix-  since hydrating will stop lasix-  need to be careful about dropping BP too low-  will put hold parameter on hydralazine 3. Metabolic acidosis-  not extreme , giving LR- improving  4. ID-  with  elevated WBC-  looking for infection, cultures pending- giving cefepime/flagyl and vanc-  now found to have septic knee-  flagyl stopped  5. Anemia  - hgb 9.6-  8.9 not that extreme-  to follow    Louis Meckel    Labs: Basic Metabolic Panel: Recent Labs  Lab 08/19/21 2056 08/20/21 0147 08/20/21 0519  NA 133* 132* 133*  K 4.4 4.6 4.2  CL 97* 99 99  CO2 20* 16* 19*  GLUCOSE 129* 123* 120*  BUN 80* 80* 77*  CREATININE 5.65* 5.23* 5.00*  CALCIUM 8.2* 8.0* 8.3*   Liver Function Tests: Recent Labs  Lab 08/18/21 1736 08/19/21 0529  AST 37 43*  ALT 14 14  ALKPHOS 168* 192*  BILITOT 1.8* 2.3*  PROT 6.7 7.1  ALBUMIN 2.0* 2.2*   No results for input(s): LIPASE, AMYLASE in the last 168 hours. No results for input(s): AMMONIA in the last 168 hours. CBC: Recent Labs  Lab 08/18/21 1736 08/18/21 2323 08/19/21 0529 08/20/21 0147  WBC 27.1*  --  29.4* 18.0*  NEUTROABS 22.3*  --   --   --   HGB 9.2* 8.8* 9.6* 8.9*  HCT 28.2* 26.0* 29.7* 27.4*  MCV 84.7  --  85.8 86.4  PLT 403*  --  453* 381   Cardiac Enzymes: Recent Labs  Lab 08/18/21 1736  CKTOTAL 221   CBG: Recent Labs  Lab 08/19/21 1614 08/19/21 1800 08/19/21 1947 08/19/21 2044 08/20/21 0630  GLUCAP 161* 142* 110* 128* 110*    Iron Studies:  Recent Labs    08/19/21 0529  IRON 21*  TIBC 188*  FERRITIN 241   Studies/Results: CT HEAD WO CONTRAST (5MM)  Result Date: 08/18/2021 CLINICAL DATA:  Delirium, altered mental status. EXAM: CT HEAD WITHOUT CONTRAST TECHNIQUE: Contiguous axial images were obtained from the base of the skull through the vertex without intravenous contrast. COMPARISON:  CT head dated 12/13/2020. FINDINGS: Brain: No evidence of acute infarction, hemorrhage, hydrocephalus, extra-axial collection or mass lesion/mass effect. There is mild cerebral volume loss with associated ex vacuo dilatation. Periventricular white matter hypoattenuation likely represents chronic small vessel  ischemic disease. Vascular: There are vascular calcifications in the carotid siphons. Skull: Normal. Negative for fracture or focal lesion. Sinuses/Orbits: There is a small left mastoid effusion. There is mild right ethmoid sinus disease. Other: None. IMPRESSION: No acute intracranial process. Electronically Signed   By: Zerita Boers M.D.   On: 08/18/2021 18:12   US Abdomen Complete  Result Date: 08/19/2021 CLINICAL DATA:  Abdominal pain EXAM: ABDOMEN ULTRASOUND COMPLETE COMPARISON:  None. FINDINGS: Gallbladder: No gallstones or wall thickening visualized. Minimal sludge. No sonographic Murphy sign noted by sonographer. Common bile duct: Diameter: 6 mm Liver: No focal lesion identified. Within normal limits in parenchymal echogenicity. Portal vein is patent on color Doppler imaging with normal direction of blood flow towards the liver. IVC: No abnormality visualized. Pancreas: Not visualized due to overlying bowel gas. Spleen: Size and appearance within normal limits. Adjacent splenule. Right Kidney: Length: 9.4 cm. Echogenicity within normal limits. No mass or hydronephrosis visualized. Left Kidney: Length: 11.1 cm. Echogenicity within normal limits. No mass or hydronephrosis visualized. Abdominal aorta: No aneurysm visualized. Other findings: None. IMPRESSION: 1. Minimal sludge within the gallbladder. No evidence of cholecystitis. 2. Otherwise unremarkable ultrasound of the abdomen. Electronically Signed   By: Merilyn Baba M.D.   On: 08/19/2021 00:26   DG Chest Portable 1 View  Result Date: 08/18/2021 CLINICAL DATA:  Altered mental status.  Hypoglycemia. EXAM: PORTABLE CHEST 1 VIEW COMPARISON:  Chest radiograph 03/27/2018.  Chest CT 01/03/2019 FINDINGS: Upper normal heart size, stable from prior exam. Unchanged mediastinal contours. Aortic atherosclerosis and tortuosity. Scattered calcified pleural plaques, better demonstrated on prior CT. No acute airspace disease. No pulmonary edema, pleural  effusion, or pneumothorax. No acute osseous abnormalities are seen. IMPRESSION: No acute findings. Aortic Atherosclerosis (ICD10-I70.0). Electronically Signed   By: Keith Rake M.D.   On: 08/18/2021 19:15   DG Knee Left Port  Result Date: 08/18/2021 CLINICAL DATA:  Sepsis, possible infection. EXAM: PORTABLE LEFT KNEE - 1-2 VIEW COMPARISON:  Included portions from femur radiograph 08/12/2021 FINDINGS: Below the knee amputation. The resection margins are smooth. No erosion or periosteal reaction. The knee joint is intact. Minimal degenerative change. No erosion or bony destruction. Advanced vascular calcifications. Mild generalized soft tissue edema. No soft tissue air. Similar density adjacent to the distal femur in the lateral soft tissues, well-defined margins and of doubtful clinical significance. IMPRESSION: 1. Below the knee amputation. No radiographic findings of osteomyelitis. 2. Mild soft tissue edema. Electronically Signed   By: Keith Rake M.D.   On: 08/18/2021 19:34   Medications:  Infusions:  ceFEPime (MAXIPIME) IV 1 g (08/20/21 0122)   lactated ringers 125 mL/hr at 08/20/21 0640   vancomycin      Scheduled Medications:  allopurinol  100 mg Oral Daily   amLODipine  10 mg Oral Daily   aspirin  81 mg Oral Daily   cholecalciferol  1,000 Units Oral Daily   docusate sodium  100 mg Oral BID   DULoxetine  60 mg Oral Daily   famotidine  40 mg Oral Daily   folic acid  1 mg Oral Daily   furosemide  20 mg Oral Daily   heparin  5,000 Units Subcutaneous Q8H   hydrALAZINE  50 mg Oral TID   hydrocerin   Topical BID   mirtazapine  7.5 mg Oral QHS   multivitamin with minerals  1 tablet Oral Daily   pentoxifylline  400 mg Oral TID WC   senna-docusate  2 tablet Oral BID   thiamine  100 mg Oral Daily   Or   thiamine  100 mg Intravenous Daily   vancomycin variable dose per unstable renal function (pharmacist dosing)   Does not apply See admin instructions    have reviewed scheduled  and prn medications.  Physical Exam: General:  mittens on -  only opens eyes to extreme stimuli-  otherwise obtunded Heart: RRR Lungs: poor effort Abdomen: obese, distended Extremities:  bilat BKA-  post op from knee I and D-  less red    08/20/2021,8:54 AM  LOS: 2 days

## 2021-08-20 NOTE — Progress Notes (Signed)
Pharmacy Antibiotic Note  Leslie [name] is a 75 y.o. male admitted on 08/18/2021 with sepsis.  Pharmacy has been consulted for cefepime and vancomycin dosing.  Patient is a 69 yom with a history of cellulitis, chronic low back pain, COPD, hyperlipidemia, hypertension, hypertension, PAD, substance abuse, alcohol abuse, type 2 diabetes mellitus. Patient presenting with AMS and she is noted with left knee intraarticular infection s/p arthrocentesis and I&D -WBC= 18, afebrile -SCr= 5.3 (noted 1.0 in 2019).  -blood cultures- ngtd -synovial cultures: GPC/clusters   Plan: -Cefepime 1gm IV q24h4 -Vancomycin 750mg  IV q48hr (last dose was 2000mg  given ~ 11pm on 10/23 -Will follow renal function, cultures and clinical progress   Weight: 91.8 kg (202 lb 6.1 oz)  Temp (24hrs), Avg:98 F (36.7 C), Min:97 F (36.1 C), Max:99 F (37.2 C)  Recent Labs  Lab 08/18/21 1736 08/18/21 2303 08/19/21 0529 08/19/21 0758 08/19/21 1302 08/19/21 1500 08/19/21 2056 08/20/21 0147 08/20/21 0519  WBC 27.1*  --  29.4*  --   --   --   --  18.0*  --   CREATININE 5.68* 5.70* 5.94*  --   --  5.84* 5.65* 5.23* 5.00*  LATICACIDVEN 2.7* 2.1*  --  2.0* 1.7  --   --   --   --      CrCl cannot be calculated (Unknown ideal weight.).    Allergies  Allergen Reactions   Atorvastatin Other (See Comments)    Unknown reaction   Statins Other (See Comments)    Unknown reaction - on MAR   Flexeril [Cyclobenzaprine] Other (See Comments)    Dry mouth and hallucinations   Antimicrobials this admission: cefepime 10/23 >>  vancomycin 10/23 >>    Thank you for allowing pharmacy to be a part of this patient's care.  Hildred Laser, PharmD Clinical Pharmacist **Pharmacist phone directory can now be found on [email or web address] ([email or web address]).  Listed under [place].

## 2021-08-20 NOTE — Progress Notes (Addendum)
HD#2 SUBJECTIVE:  Patient Summary: Leslie [name] is a 75 y.o. with a pertinent PMH of HTN, HLD, T2DM, COPD, bilateral BKAs, alcohol use disorder, PAD, and a remote history of hepatitis, who presented with altered mental status and admitted acute encephalopathy on hospital day 2.   Overnight Events:  Patient was taken to operating room last night for I&D.  At that time his home medications, including pregabalin, Klonopin, and Norco were restarted by ortho as well as his home blood pressure medications.  Interim History:  Patient was evaluated at bedside this AM.  He opens his eyes to questions, but is not able to follow commands.  He received several sedating medications overnight and is more sedated than yesterday.   OBJECTIVE:  Vital Signs: Vitals:   08/19/21 2015 08/19/21 2041 08/19/21 2306 08/20/21 0345  BP: (!) 168/62 (!) 178/60 (!) 167/74 127/64  Pulse: (!) 101 100 100 75  Resp: 17 18 14 14   Temp: 97.7 F (36.5 C) 97.7 F (36.5 C) 97.7 F (36.5 C) 97.8 F (36.6 C)  TempSrc:  Oral Axillary Axillary  SpO2: 95% 96% 100% 100%  Weight:  91.8 kg  91.8 kg   Supplemental O2: Room Air SpO2: 100 % O2 Flow Rate (L/min): 2 L/min  Filed Weights   08/18/21 1734 08/19/21 2041 08/20/21 0345  Weight: 90.1 kg 91.8 kg 91.8 kg     Intake/Output Summary (Last 24 hours) at 08/20/2021 0543 Last data filed at 08/20/2021 0348 Gross per 24 hour  Intake 2032 ml  Output 605 ml  Net 1427 ml    Net IO Since Admission: 2,527 mL [08/20/21 0543]  Physical Exam: General: somnolent, elderly man HENT: NCAT, tongue appears dry with black film present on tongue and hard palate, patient is breathing through mouth Eyes: no scleral icterus, conjunctiva clear CV: no murmurs, normal rate Pulm: CTAB, normal pulmonary effort GI: no tenderness, bowel sounds present, no distension MSK: Bilateral BKAs, drain present on left knee joint, left joint remains warm to touch Skin: warm and dry  Neuro:  somnolent, opens eyes to questions   Patient Lines/Drains/Airways Status     Active Line/Drains/Airways     Name Placement date Placement time Site Days   Peripheral IV 08/18/21 20 G Left;Posterior Wrist 08/18/21  1724  Wrist  1   Negative Pressure Wound Therapy Knee Anterior;Right 03/28/18  0845  --  1240   External Urinary Catheter 08/18/21  1854  --  1   Incision (Closed) 03/28/18 Leg Right 03/28/18  0839  -- 1240            Pertinent Labs: CBC Latest Ref Rng & Units 08/20/2021 08/19/2021 08/18/2021  WBC 4.0 - 10.5 K/uL 18.0(H) 29.4(H) -  Hemoglobin 13.0 - 17.0 g/dL 8.9(L) 9.6(L) 8.8(L)  Hematocrit 39.0 - 52.0 % 27.4(L) 29.7(L) 26.0(L)  Platelets 150 - 400 K/uL 381 453(H) -    CMP Latest Ref Rng & Units 08/20/2021 08/19/2021 08/19/2021  Glucose 70 - 99 mg/dL 123(H) 129(H) 79  BUN 8 - 23 mg/dL 80(H) 80(H) 80(H)  Creatinine 0.61 - 1.24 mg/dL 5.23(H) 5.65(H) 5.84(H)  Sodium 135 - 145 mmol/L 132(L) 133(L) 132(L)  Potassium 3.5 - 5.1 mmol/L 4.6 4.4 4.6  Chloride 98 - 111 mmol/L 99 97(L) 96(L)  CO2 22 - 32 mmol/L 16(L) 20(L) 18(L)  Calcium 8.9 - 10.3 mg/dL 8.0(L) 8.2(L) 8.1(L)  Total Protein 6.5 - 8.1 g/dL - - -  Total Bilirubin 0.3 - 1.2 mg/dL - - -  Alkaline  Phos 38 - 126 U/L - - -  AST 15 - 41 U/L - - -  ALT 0 - 44 U/L - - -    Recent Labs    08/19/21 1800 08/19/21 1947 08/19/21 2044  GLUCAP 142* 110* 128*      Pertinent Imaging: No results found.  ASSESSMENT/PLAN:  Assessment: Active Problems:   Altered mental status   S/P debridement  Leslie [name] is a 75 y.o. with a pertinent PMH of HTN, HLD, T2DM, COPD, bilateral BKAs, alcohol use disorder, PAD, and a remote history of hepatitis, who presented with altered mental status and admitted acute encephalopathy on hospital day 2.   Plan: Acute encephalopathy Patient received carbamazepine, Klonopin, Remeron, Norco, and pregabalin last night that are renally cleared. He is sedated, does not answer  questions.  He opens eyes to questions, but unable to follow commands. Encephalopathy is likely multifactorial with sedating medications, uremia, vs infection.  Will continue to monitor closely due to increased risk for aspiration. -holding sedating medications   C/f Left knee intraarticular infection Leukocytosis Arthrocentesis yesterday revealed frank pus.  Patient was taken to the OR last night for I&D of left knee. Large purulent effusion noted per operative report.   Leukocytosis improving from 29.4 at 18 this morning.  Knee synovial fluid growing moderate Staph aureus, will follow-up on speciation. Will continue empiric antibiotics with vancomycin and cefepime and de-escalate as speciation results are finalized.  -Appreciate assistance from orthopedic surgery  -Continue IV Vancomycin and Cefepime, day 2 -Blood and urine cultures pending, no growth to date -CBC pending  EKG changes Prolonged Qtc EKG with some T wave inversions and ST depressions, however these are reflected on previous EKGs.  Initial troponin mildly elevated and trended up before flattening, although patient does have severe acute kidney injury.  TTE showed EF of 60-65% with no regional wall motion abnormalities with mild concentric left ventricular hypertrophy.   -Troponin trended down with 28->76->86->65 -Telemetry monitoring  Acute Kidney Injury Acidosis Oliguria Creatinine slowly trending down from 5.94 at its peak yesterday to 5.00 today, BUN remains elevated.  Nephrology was consulted and appreciate their recommendations.  No urgent need for dialysis at this time.  Holding home medication of losartan.    -Neurology following, appreciate their recommendations -Continue to monitor renal function, trending BMP today, monitoring for post-ATN diuresis -Continue LR 125 cc/hr  Hypovolemic Hyponatremia Sodium was corrected from 126->133 today.  Osmolality, urine 329 and Sodium, urine 68  -BMP trend -Goal correction  of 6-8/ 24 hour, unclear if this is acute verus chronic.  Normocytic anemia Hemoglobin of 9.6 to 8.9 with decrease in hemotocrit as well likely secondary to fluid resuscitation. Iron studies reflecting iron deficiency and anemia of chronic disease. Ganzoni equation calculation of 1019 mg iron deficiency. Patient currently has infective process of left knee joint, will consider iron supplementation once infection resolves.  -Monitor CBC  Alcohol use disorder Patient unable to quantify alcohol use history nor is he able to state when his last drink was. Currently trying to limit sedating medications, at home patient is on lyrica and Carbamazepine 200 mg AM and 300 mg PM.    -Ciwa protocol without ativan -Holding sedating medications, including home medications of lyrica and carbamazepine. -thiamine 357 mg -Folic acid 1 mg -Rapid drug screen pending  Type 2 diabetes with peripheral neuropathy Hypoglycemia Patient was given one amp of D50 yesterday due to glucose . Glucose has remained between 110-126 today. Confirmed carbamazepine prescribed for neuropathy, not  seizures, per [facility] provider.  -CBGs q 2 hours -Continue holding home insulin given hypoglycemia -holding carbamazepime due to sedating effects  Hypertension Home medications include Amlodipine 10 mg, Hydralazine 50 mg TID, and Metoprolol 24 hr 200 mg.  Blood pressure was 112/57 on initial presentation to ED.  Will continue to hold blood pressure medications at this time.  His blood pressure has fluctuated over the last 24 hours, if he is consistently hypertensive, will consider gradually adding back on home medications for BP control.  -Holding Amlodipine, Hydralazine, Metoprolol, lasix  Hyperlipidemia Lipid panel showed triglycerides of 208 and LDL not able to be calculated.  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  -On aspirin 81 mg at home, will continue  Best Practice: Diet: NPO due to  altered mental status IVF: Fluids: LR, Rate:  150 cc/hr x 20 hrs VTE: heparin injection 5,000 Units Start: 08/19/21 1400 Code: Full AB: vancomycin, cefepime DISPO: Anticipated discharge pending Medical stability.  Signature: Christiana Fuchs, D.O. Internal Medicine Resident, PGY-1 [facility]  Pager: [phone] 5:43 AM, 08/20/2021   Please contact the on call pager after 5 pm and on weekends at [phone].

## 2021-08-21 ENCOUNTER — Inpatient Hospital Stay (HOSPITAL_COMMUNITY): Payer: Medicare (Managed Care)

## 2021-08-21 DIAGNOSIS — R4182 Altered mental status, unspecified: Secondary | ICD-10-CM

## 2021-08-21 DIAGNOSIS — A4901 Methicillin susceptible Staphylococcus aureus infection, unspecified site: Secondary | ICD-10-CM | POA: Diagnosis not present

## 2021-08-21 DIAGNOSIS — M00062 Staphylococcal arthritis, left knee: Secondary | ICD-10-CM

## 2021-08-21 DIAGNOSIS — G9340 Encephalopathy, unspecified: Secondary | ICD-10-CM | POA: Diagnosis not present

## 2021-08-21 DIAGNOSIS — M009 Pyogenic arthritis, unspecified: Secondary | ICD-10-CM

## 2021-08-21 LAB — CBC
HCT: 28 % — ABNORMAL LOW (ref 39.0–52.0)
Hemoglobin: 8.9 g/dL — ABNORMAL LOW (ref 13.0–17.0)
MCH: 27.4 pg (ref 26.0–34.0)
MCHC: 31.8 g/dL (ref 30.0–36.0)
MCV: 86.2 fL (ref 80.0–100.0)
Platelets: 343 10*3/uL (ref 150–400)
RBC: 3.25 MIL/uL — ABNORMAL LOW (ref 4.22–5.81)
RDW: 16.6 % — ABNORMAL HIGH (ref 11.5–15.5)
WBC: 16.5 10*3/uL — ABNORMAL HIGH (ref 4.0–10.5)
nRBC: 0.2 % (ref 0.0–0.2)

## 2021-08-21 LAB — BASIC METABOLIC PANEL
Anion gap: 15 (ref 5–15)
BUN: 57 mg/dL — ABNORMAL HIGH (ref 8–23)
CO2: 22 mmol/L (ref 22–32)
Calcium: 8.9 mg/dL (ref 8.9–10.3)
Chloride: 103 mmol/L (ref 98–111)
Creatinine, Ser: 3.2 mg/dL — ABNORMAL HIGH (ref 0.61–1.24)
GFR, Estimated: 19 mL/min — ABNORMAL LOW (ref >60)
Glucose, Bld: 170 mg/dL — ABNORMAL HIGH (ref 70–99)
Potassium: 4.5 mmol/L (ref 3.5–5.1)
Sodium: 140 mmol/L (ref 135–145)

## 2021-08-21 LAB — RENAL FUNCTION PANEL
Albumin: 1.9 g/dL — ABNORMAL LOW (ref 3.5–5.0)
Anion gap: 15 (ref 5–15)
BUN: 67 mg/dL — ABNORMAL HIGH (ref 8–23)
CO2: 20 mmol/L — ABNORMAL LOW (ref 22–32)
Calcium: 8.6 mg/dL — ABNORMAL LOW (ref 8.9–10.3)
Chloride: 102 mmol/L (ref 98–111)
Creatinine, Ser: 3.83 mg/dL — ABNORMAL HIGH (ref 0.61–1.24)
GFR, Estimated: 16 mL/min — ABNORMAL LOW (ref >60)
Glucose, Bld: 123 mg/dL — ABNORMAL HIGH (ref 70–99)
Phosphorus: 5.4 mg/dL — ABNORMAL HIGH (ref 2.5–4.6)
Potassium: 4.6 mmol/L (ref 3.5–5.1)
Sodium: 137 mmol/L (ref 135–145)

## 2021-08-21 LAB — GLUCOSE, CAPILLARY
Glucose-Capillary: 133 mg/dL — ABNORMAL HIGH (ref 70–99)
Glucose-Capillary: 141 mg/dL — ABNORMAL HIGH (ref 70–99)
Glucose-Capillary: 140 mg/dL — ABNORMAL HIGH (ref 70–99)
Glucose-Capillary: 151 mg/dL — ABNORMAL HIGH (ref 70–99)

## 2021-08-21 MED ORDER — ACETAMINOPHEN 325 MG PO TABS: 325.0000 mg | ORAL_TABLET | Freq: Four times a day (QID) | ORAL | Status: DC | PRN

## 2021-08-21 MED ORDER — LACTATED RINGERS IV BOLUS: 1000.0000 mL | Freq: Once | INTRAVENOUS | Status: DC

## 2021-08-21 MED ORDER — AMLODIPINE BESYLATE 5 MG PO TABS: 2.5000 mg | ORAL_TABLET | Freq: Every day | ORAL | Status: DC

## 2021-08-21 MED ORDER — ASPIRIN 81 MG PO CHEW
81.0000 mg | CHEWABLE_TABLET | Freq: Every day | ORAL | Status: DC
  Administered 2021-08-22 – 2021-08-23 (×2): 81 mg via NASOGASTRIC
  Filled 2021-08-21 (×2): qty 1

## 2021-08-21 MED ORDER — LACTATED RINGERS IV SOLN: INTRAVENOUS | Status: DC

## 2021-08-21 MED ORDER — ASPIRIN 81 MG PO CHEW: 81.0000 mg | CHEWABLE_TABLET | Freq: Every day | ORAL | Status: DC

## 2021-08-21 MED ORDER — AMLODIPINE BESYLATE 5 MG PO TABS: 5.0000 mg | ORAL_TABLET | Freq: Every day | ORAL | Status: DC

## 2021-08-21 MED ORDER — SENNOSIDES-DOCUSATE SODIUM 8.6-50 MG PO TABS: 1.0000 | ORAL_TABLET | Freq: Every evening | ORAL | Status: DC | PRN

## 2021-08-21 MED ORDER — CEFAZOLIN SODIUM-DEXTROSE 2-4 GM/100ML-% IV SOLN
2.0000 g | Freq: Two times a day (BID) | INTRAVENOUS | Status: DC
  Administered 2021-08-21 – 2021-08-24 (×8): 2 g via INTRAVENOUS
  Filled 2021-08-21 (×9): qty 100

## 2021-08-21 NOTE — Procedures (Signed)
Cortrak  Person Inserting Tube:  Kalicia Dufresne, Creola Corn, RD Tube Type:  Cortrak - 43 inches Tube Size:  10 Tube Location:  Right nare Initial Placement:  Stomach Secured by: Bridle Technique Used to Measure Tube Placement:  Marking at nare/corner of mouth Cortrak Secured At:  74 cm  Cortrak Tube Team Note:  Consult received to place a Cortrak feeding tube.   X-ray is required, abdominal x-ray has been ordered by the Cortrak team. Please confirm tube placement before using the Cortrak tube.   If the tube becomes dislodged please keep the tube and contact the Cortrak team at [email or web address] ([email or web address]) for replacement.  If after hours and replacement cannot be delayed, place a NG tube and confirm placement with an abdominal x-ray.     Larkin Ina, MS, RD, LDN (she/her/hers) RD pager number and weekend/on-call pager number located in [place].

## 2021-08-21 NOTE — Progress Notes (Signed)
SLP Cancellation Note  Patient Details Name: CORDAY WYKA MRN: [id number] DOB: 1946-05-23   Cancelled treatment:       Reason Eval/Treat Not Completed: Fatigue/lethargy limiting ability to participate. Pt not alert enough for PO intake. Suggested Cortrak placement to RN.    Wilhemina Grall, Katherene Ponto 08/21/2021, 12:04 PM

## 2021-08-21 NOTE — Progress Notes (Signed)
Subjective:   hemodynamically stable , 3200  of UOP-  BUN and crt trending slightly better again-  eyes open but still not talking   Objective Vital signs in last 24 hours: Vitals:   08/20/21 2033 08/20/21 2320 08/21/21 0316 08/21/21 0828  BP: 139/65 (!) 157/136 (!) 127/116 (!) 141/64  Pulse: 85 78 80 81  Resp: 18 20 20 20   Temp: 99.6 F (37.6 C) 99 F (37.2 C) 99 F (37.2 C) 98.7 F (37.1 C)  TempSrc: Axillary Axillary Axillary Oral  SpO2: 100% 100% 100% 99%  Weight:   90.1 kg    Weight change: -1.7 kg  Intake/Output Summary (Last 24 hours) at 08/21/2021 1013 Last data filed at 08/21/2021 0700 Gross per 24 hour  Intake 200 ml  Output 2150 ml  Net -1950 ml    Assessment/Plan: 75 year old WM with altered MS and also with AKI 1.Renal- if crt was 1.5 in June ( reported in the chart but I have not been able to verify) -  this is either an acute or subacute presentation.   His U/A is bland so does not seem like GN-  u/s does not show obstruction.  It would fit with a pre renal insult-  possibly a low BP episode on his ARB that gave him an AKI.    I wondered if the AKI in addition to the meds he is taking-  specifically lyrica, narcotics and klonopin are what was responsible for some of his AMS-  also septic knee. There are still no acute indications for dialysis, renal function improving slowly -  hold losartan and other BP meds as well as sedating agents and follow 2. Hypertension/volume  - he did not seen overloaded-  dark urine.  I am OK with IVF for now-  Had been put back on amlodipine/hydralazine and lasix-  since hydrating I stopped lasix-  need to be careful about dropping BP too low-  will put hold parameter on hydralazine.  Now it seems BP meds have been stopped.  I think tank is  full-  I would not do bolus and will dec IVF  3. Metabolic acidosis-  not extreme , giving LR- improving  4. ID-  with elevated WBC-  looking for infection, cultures pending- giving cefepime/flagyl and  vanc-  now found to have septic knee-  abx simplified to vanc -  WBC improving  5. Anemia  - hgb 9.6-  8.9 not that extreme-  to follow  6. Dec MS- cannot explain-  BUN of 67 would not do-  I would think sedating meds would be out of system by now-  but actually got them Monday night  ? HCT negative for CVA on admit- previously lived alone ?  May need to get palliative on the case if cont to not be able to respond or make decisions    Leslie [name]    Labs: Basic Metabolic Panel: Recent Labs  Lab 08/20/21 1128 08/20/21 2203 08/21/21 0217  NA 133* 136 137  K 4.4 4.3 4.6  CL 98 101 102  CO2 20* 19* 20*  GLUCOSE 122* 127* 123*  BUN 74* 69* 67*  CREATININE 4.71* 4.09* 3.83*  CALCIUM 8.4* 8.6* 8.6*  PHOS  --   --  5.4*   Liver Function Tests: Recent Labs  Lab 08/18/21 1736 08/19/21 0529 08/21/21 0217  AST 37 43*  --   ALT 14 14  --   ALKPHOS 168* 192*  --  BILITOT 1.8* 2.3*  --   PROT 6.7 7.1  --   ALBUMIN 2.0* 2.2* 1.9*   No results for input(s): LIPASE, AMYLASE in the last 168 hours. No results for input(s): AMMONIA in the last 168 hours. CBC: Recent Labs  Lab 08/18/21 1736 08/18/21 2323 08/19/21 0529 08/20/21 0147 08/21/21 0217  WBC 27.1*  --  29.4* 18.0* 16.5*  NEUTROABS 22.3*  --   --   --   --   HGB 9.2*   < > 9.6* 8.9* 8.9*  HCT 28.2*   < > 29.7* 27.4* 28.0*  MCV 84.7  --  85.8 86.4 86.2  PLT 403*  --  453* 381 343   < > = values in this interval not displayed.   Cardiac Enzymes: Recent Labs  Lab 08/18/21 1736  CKTOTAL 221   CBG: Recent Labs  Lab 08/20/21 1404 08/20/21 1545 08/20/21 1854 08/20/21 2035 08/21/21 0622  GLUCAP 120* 119* 116* 125* 133*    Iron Studies:  Recent Labs    08/19/21 0529  IRON 21*  TIBC 188*  FERRITIN 241   Studies/Results: ECHOCARDIOGRAM COMPLETE  Result Date: 08/20/2021    ECHOCARDIOGRAM REPORT   Patient Name:   Leslie [name] Date of Exam: 08/20/2021 Medical Rec #:  [id number]       Height:        66.0 in Accession #:    [id number]      Weight:       202.4 lb Date of Birth:  10-20-1946       BSA:          2.010 m Patient Age:    43 years        BP:           127/64 mmHg Patient Gender: M               HR:           76 bpm. Exam Location:  Inpatient Procedure: 2D Echo, Cardiac Doppler and Color Doppler Indications:    Abnl Ekg  History:        Patient has no prior history of Echocardiogram examinations.                 Risk Factors:Hypertension and Diabetes.  Sonographer:    Helmut Muster Referring Phys: [place]  1. Left ventricular ejection fraction, by estimation, is 60 to 65%. The left ventricle has normal function. The left ventricle has no regional wall motion abnormalities. There is mild concentric left ventricular hypertrophy. Left ventricular diastolic function could not be evaluated.  2. Right ventricular systolic function is normal. The right ventricular size is normal. Tricuspid regurgitation signal is inadequate for assessing PA pressure.  3. The mitral valve is degenerative. Trivial mitral valve regurgitation. Mild mitral stenosis. Moderate to severe mitral annular calcification.  4. The aortic valve is grossly normal. Aortic valve regurgitation is not visualized. No aortic stenosis is present.  5. The inferior vena cava is normal in size with greater than 50% respiratory variability, suggesting right atrial pressure of 3 mmHg. FINDINGS  Left Ventricle: Left ventricular ejection fraction, by estimation, is 60 to 65%. The left ventricle has normal function. The left ventricle has no regional wall motion abnormalities. The left ventricular internal cavity size was normal in size. There is  mild concentric left ventricular hypertrophy. Left ventricular diastolic function could not be evaluated due to mitral annular calcification (moderate or greater). Left ventricular diastolic function could not  be evaluated. Right Ventricle: The right ventricular size is normal. No increase  in right ventricular wall thickness. Right ventricular systolic function is normal. Tricuspid regurgitation signal is inadequate for assessing PA pressure. Left Atrium: Left atrial size was normal in size. Right Atrium: Right atrial size was normal in size. Pericardium: Trivial pericardial effusion is present. Mitral Valve: The mitral valve is degenerative in appearance. Moderate to severe mitral annular calcification. Trivial mitral valve regurgitation. Mild mitral valve stenosis. MV peak gradient, 10.5 mmHg. The mean mitral valve gradient is 5.0 mmHg. Tricuspid Valve: The tricuspid valve is grossly normal. Tricuspid valve regurgitation is not demonstrated. No evidence of tricuspid stenosis. Aortic Valve: The aortic valve is grossly normal. Aortic valve regurgitation is not visualized. No aortic stenosis is present. Pulmonic Valve: The pulmonic valve was grossly normal. Pulmonic valve regurgitation is not visualized. No evidence of pulmonic stenosis. Aorta: The aortic root and ascending aorta are structurally normal, with no evidence of dilitation. Venous: The right lower pulmonary vein is normal. The inferior vena cava is normal in size with greater than 50% respiratory variability, suggesting right atrial pressure of 3 mmHg. IAS/Shunts: The atrial septum is grossly normal.  LEFT VENTRICLE PLAX 2D LVIDd:         3.90 cm      Diastology LVIDs:         2.70 cm      LV e' medial:    6.09 cm/s LV PW:         1.40 cm      LV E/e' medial:  26.1 LV IVS:        1.40 cm      LV e' lateral:   10.60 cm/s LVOT diam:     2.00 cm      LV E/e' lateral: 15.0 LV SV:         89 LV SV Index:   44 LVOT Area:     3.14 cm  LV Volumes (MOD) LV vol d, MOD A2C: 105.0 ml LV vol d, MOD A4C: 76.5 ml LV vol s, MOD A2C: 40.9 ml LV vol s, MOD A4C: 33.6 ml LV SV MOD A2C:     64.1 ml LV SV MOD A4C:     76.5 ml LV SV MOD BP:      52.8 ml RIGHT VENTRICLE             IVC RV S prime:     13.70 cm/s  IVC diam: 1.90 cm TAPSE (M-mode): 2.5 cm LEFT  ATRIUM             Index        RIGHT ATRIUM           Index LA diam:        3.70 cm 1.84 cm/m   RA Area:     15.90 cm LA Vol (A2C):   59.6 ml 29.65 ml/m  RA Volume:   33.40 ml  16.62 ml/m LA Vol (A4C):   63.7 ml 31.69 ml/m LA Biplane Vol: 64.6 ml 32.14 ml/m  AORTIC VALVE LVOT Vmax:   123.00 cm/s LVOT Vmean:  83.000 cm/s LVOT VTI:    0.282 m  AORTA Ao Root diam: 3.80 cm Ao Asc diam:  3.90 cm MITRAL VALVE MV Area (PHT): 2.76 cm     SHUNTS MV Area VTI:   1.77 cm     Systemic VTI:  0.28 m MV Peak grad:  10.5 mmHg    Systemic Diam: 2.00 cm MV  Mean grad:  5.0 mmHg MV Vmax:       1.62 m/s MV Vmean:      100.0 cm/s MV Decel Time: 275 msec MV E velocity: 159.00 cm/s MV A velocity: 134.00 cm/s MV E/A ratio:  1.19 Eleonore Chiquito MD Electronically signed by Eleonore Chiquito MD Signature Date/Time: 08/20/2021/12:34:20 PM    Final    Medications: Infusions:  ceFEPime (MAXIPIME) IV Stopped (08/20/21 2308)   lactated ringers     lactated ringers 125 mL/hr at 08/20/21 1529   vancomycin 750 mg (08/20/21 2317)    Scheduled Medications:  cholecalciferol  1,000 Units Oral Daily   heparin  5,000 Units Subcutaneous Q8H   hydrocerin   Topical BID   thiamine  100 mg Intravenous Daily   vancomycin variable dose per unstable renal function (pharmacist dosing)   Does not apply See admin instructions    have reviewed scheduled and prn medications.  Physical Exam: General:  mittens on -   eyes open-  is responding some to being worked on...Marland KitchenMarland KitchenMarland Kitchen but nothing purposeful  Heart: RRR Lungs: poor effort Abdomen: obese, distended Extremities:  bilat BKA-  post op from knee I and D-  less red    08/21/2021,10:13 AM  LOS: 3 days

## 2021-08-21 NOTE — Progress Notes (Addendum)
HD#3 SUBJECTIVE:  Patient Summary: Leslie [name] is a 75 y.o. with a pertinent PMH of HTN, HLD, T2DM, COPD, bilateral BKAs, alcohol use disorder, PAD, and a remote history of hepatitis, who presented with altered mental status and admitted for acute encephalopathy complicated by Acute Kidney Injury on hospital day 3. Patient has significant tenderness to left knee joint and arthrocentesis showed frank pus, patient was taken to OR for I and D 10/24.  Overnight Events:  No overnight events  Interim History:  Patient evaluated at bedside this AM. He opens his eyes and is oriented to his name, but not place or time.  He states that he is doing all right when asked how he is feeling.  OBJECTIVE:  Vital Signs: Vitals:   08/20/21 1600 08/20/21 2033 08/20/21 2320 08/21/21 0316  BP: (!) 145/78 139/65 (!) 157/136 (!) 127/116  Pulse:  85 78 80  Resp: 16 18 20 20   Temp: 99.6 F (37.6 C) 99.6 F (37.6 C) 99 F (37.2 C) 99 F (37.2 C)  TempSrc: Axillary Axillary Axillary Axillary  SpO2:  100% 100% 100%  Weight:    90.1 kg   Supplemental O2: Room Air SpO2: 100 % O2 Flow Rate (L/min): 3 L/min  Filed Weights   08/19/21 2041 08/20/21 0345 08/21/21 0316  Weight: 91.8 kg 91.8 kg 90.1 kg     Intake/Output Summary (Last 24 hours) at 08/21/2021 1740 Last data filed at 08/21/2021 0540 Gross per 24 hour  Intake --  Output 3250 ml  Net -3250 ml    Net IO Since Admission: -723 mL [08/21/21 0621]  Physical Exam: General: chronically ill-appearing, elderly man HENT: NCAT Eyes: no scleral icterus, conjunctiva clear CV: no murmurs, normal rate Pulm: CTAB, pulmonary effort normal  GI: no tenderness, soft, non-distended MSK: bilateral BKAs, blanching, erythema of left knee joint which is warm to touch and tender Skin: warm and dry, skin on hands bilaterally with scaling, non blanching erythema Psych: somnolent, agitated intermittently   Patient Lines/Drains/Airways Status     Active  Line/Drains/Airways     Name Placement date Placement time Site Days   Peripheral IV 08/18/21 20 G Left;Posterior Wrist 08/18/21  1724  Wrist  1   Negative Pressure Wound Therapy Knee Anterior;Right 03/28/18  0845  --  1240   External Urinary Catheter 08/18/21  1854  --  1   Incision (Closed) 03/28/18 Leg Right 03/28/18  0839  -- 1240            Pertinent Labs: CBC Latest Ref Rng & Units 08/21/2021 08/20/2021 08/19/2021  WBC 4.0 - 10.5 K/uL 16.5(H) 18.0(H) 29.4(H)  Hemoglobin 13.0 - 17.0 g/dL 8.9(L) 8.9(L) 9.6(L)  Hematocrit 39.0 - 52.0 % 28.0(L) 27.4(L) 29.7(L)  Platelets 150 - 400 K/uL 343 381 453(H)    CMP Latest Ref Rng & Units 08/20/2021 08/20/2021 08/20/2021  Glucose 70 - 99 mg/dL 127(H) 122(H) 120(H)  BUN 8 - 23 mg/dL 69(H) 74(H) 77(H)  Creatinine 0.61 - 1.24 mg/dL 4.09(H) 4.71(H) 5.00(H)  Sodium 135 - 145 mmol/L 136 133(L) 133(L)  Potassium 3.5 - 5.1 mmol/L 4.3 4.4 4.2  Chloride 98 - 111 mmol/L 101 98 99  CO2 22 - 32 mmol/L 19(L) 20(L) 19(L)  Calcium 8.9 - 10.3 mg/dL 8.6(L) 8.4(L) 8.3(L)  Total Protein 6.5 - 8.1 g/dL - - -  Total Bilirubin 0.3 - 1.2 mg/dL - - -  Alkaline Phos 38 - 126 U/L - - -  AST 15 - 41 U/L - - -  ALT 0 - 44 U/L - - -    Recent Labs    08/20/21 1545 08/20/21 1854 08/20/21 2035  GLUCAP 119* 116* 125*      Pertinent Imaging: ECHOCARDIOGRAM COMPLETE  Result Date: 08/20/2021    ECHOCARDIOGRAM REPORT   Patient Name:   Leslie [name] Date of Exam: 08/20/2021 Medical Rec #:  [id number]       Height:       66.0 in Accession #:    [id number]      Weight:       202.4 lb Date of Birth:  [birth year]       BSA:          2.010 m Patient Age:    43 years        BP:           127/64 mmHg Patient Gender: M               HR:           76 bpm. Exam Location:  Inpatient Procedure: 2D Echo, Cardiac Doppler and Color Doppler Indications:    Abnl Ekg  History:        Patient has no prior history of Echocardiogram examinations.                 Risk  Factors:Hypertension and Diabetes.  Sonographer:    Helmut Muster Referring Phys: [place]  1. Left ventricular ejection fraction, by estimation, is 60 to 65%. The left ventricle has normal function. The left ventricle has no regional wall motion abnormalities. There is mild concentric left ventricular hypertrophy. Left ventricular diastolic function could not be evaluated.  2. Right ventricular systolic function is normal. The right ventricular size is normal. Tricuspid regurgitation signal is inadequate for assessing PA pressure.  3. The mitral valve is degenerative. Trivial mitral valve regurgitation. Mild mitral stenosis. Moderate to severe mitral annular calcification.  4. The aortic valve is grossly normal. Aortic valve regurgitation is not visualized. No aortic stenosis is present.  5. The inferior vena cava is normal in size with greater than 50% respiratory variability, suggesting right atrial pressure of 3 mmHg. FINDINGS  Left Ventricle: Left ventricular ejection fraction, by estimation, is 60 to 65%. The left ventricle has normal function. The left ventricle has no regional wall motion abnormalities. The left ventricular internal cavity size was normal in size. There is  mild concentric left ventricular hypertrophy. Left ventricular diastolic function could not be evaluated due to mitral annular calcification (moderate or greater). Left ventricular diastolic function could not be evaluated. Right Ventricle: The right ventricular size is normal. No increase in right ventricular wall thickness. Right ventricular systolic function is normal. Tricuspid regurgitation signal is inadequate for assessing PA pressure. Left Atrium: Left atrial size was normal in size. Right Atrium: Right atrial size was normal in size. Pericardium: Trivial pericardial effusion is present. Mitral Valve: The mitral valve is degenerative in appearance. Moderate to severe mitral annular calcification. Trivial  mitral valve regurgitation. Mild mitral valve stenosis. MV peak gradient, 10.5 mmHg. The mean mitral valve gradient is 5.0 mmHg. Tricuspid Valve: The tricuspid valve is grossly normal. Tricuspid valve regurgitation is not demonstrated. No evidence of tricuspid stenosis. Aortic Valve: The aortic valve is grossly normal. Aortic valve regurgitation is not visualized. No aortic stenosis is present. Pulmonic Valve: The pulmonic valve was grossly normal. Pulmonic valve regurgitation is not visualized. No evidence of pulmonic stenosis. Aorta: The aortic root and ascending aorta  are structurally normal, with no evidence of dilitation. Venous: The right lower pulmonary vein is normal. The inferior vena cava is normal in size with greater than 50% respiratory variability, suggesting right atrial pressure of 3 mmHg. IAS/Shunts: The atrial septum is grossly normal.  LEFT VENTRICLE PLAX 2D LVIDd:         3.90 cm      Diastology LVIDs:         2.70 cm      LV e' medial:    6.09 cm/s LV PW:         1.40 cm      LV E/e' medial:  26.1 LV IVS:        1.40 cm      LV e' lateral:   10.60 cm/s LVOT diam:     2.00 cm      LV E/e' lateral: 15.0 LV SV:         89 LV SV Index:   44 LVOT Area:     3.14 cm  LV Volumes (MOD) LV vol d, MOD A2C: 105.0 ml LV vol d, MOD A4C: 76.5 ml LV vol s, MOD A2C: 40.9 ml LV vol s, MOD A4C: 33.6 ml LV SV MOD A2C:     64.1 ml LV SV MOD A4C:     76.5 ml LV SV MOD BP:      52.8 ml RIGHT VENTRICLE             IVC RV S prime:     13.70 cm/s  IVC diam: 1.90 cm TAPSE (M-mode): 2.5 cm LEFT ATRIUM             Index        RIGHT ATRIUM           Index LA diam:        3.70 cm 1.84 cm/m   RA Area:     15.90 cm LA Vol (A2C):   59.6 ml 29.65 ml/m  RA Volume:   33.40 ml  16.62 ml/m LA Vol (A4C):   63.7 ml 31.69 ml/m LA Biplane Vol: 64.6 ml 32.14 ml/m  AORTIC VALVE LVOT Vmax:   123.00 cm/s LVOT Vmean:  83.000 cm/s LVOT VTI:    0.282 m  AORTA Ao Root diam: 3.80 cm Ao Asc diam:  3.90 cm MITRAL VALVE MV Area (PHT): 2.76  cm     SHUNTS MV Area VTI:   1.77 cm     Systemic VTI:  0.28 m MV Peak grad:  10.5 mmHg    Systemic Diam: 2.00 cm MV Mean grad:  5.0 mmHg MV Vmax:       1.62 m/s MV Vmean:      100.0 cm/s MV Decel Time: 275 msec MV E velocity: 159.00 cm/s MV A velocity: 134.00 cm/s MV E/A ratio:  1.19 Eleonore Chiquito MD Electronically signed by Eleonore Chiquito MD Signature Date/Time: 08/20/2021/12:34:20 PM    Final     ASSESSMENT/PLAN:  Assessment: Active Problems:   Acute renal failure (HCC)   Altered mental status   S/P debridement  GARNER DULLEA is a 75 y.o. with a pertinent PMH of HTN, HLD, T2DM, COPD, bilateral BKAs, alcohol use disorder, PAD, and a remote history of hepatitis, who presented with altered mental status and admitted for acute encephalopathy complicated by Acute Kidney Injury on hospital day 3. Patient has significant tenderness to left knee joint and arthrocentesis showed frank pus, patient was taken to OR for I and D 10/24.  Plan: Acute encephalopathy Patient is able to state name, but remains sleepy and difficult to wake up. He is protecting airway, but is not alert enough for PO intake.  Called and spoke with patient's sister, Leslie [name].  At baseline patient lives alone, is oriented x4, and able to take care of most of his ADLs.  She helps with bringing him things and transportation if needed.  He is hard of hearing and low level of education and literacy. She states that Leslie [name] neighbor checked on him on 10/22 and patient was not feeling well, but was able to answer the door that evening.  The following day, she found him altered and unable to get out of bed.   Will continue to monitor closely due to increased risk for aspiration.  Patient received home medications that had been held by IM team on October 24 postoperatively.  These medications could be contributing to his continued altered mental status, although would expect their effect to resolve soon.  Hyponatremia has resolved and  uremia is improving.   -holding sedating medications -NPO -Cortrak placement   MSSA septic arthritis of left knee s/p I&D Leukocytosis Cellulitis of left stump Open Arthrotomy completed 10/24 with large purulent effusion.  On exam today, knee remains swollen and tender.  His left stump is erythematous concerning for overlying cellulitis.  Leukocytosis is improving, blood cell count trending down from 18-16.5. Knee synovial fluid growing MSSA.  ID consulted to help with determining antibiotic choice and duration.  Patient has been on vancomycin and cefepime for 3 days, will discontinue mycin and cefepime and de-escalate to cefazolin.  -Appreciate assistance from orthopedic surgery  -Appreciate Infectious Disease recommendations -Start IV cefazolin -Discontinue IV Vancomycin, day 3 -Discontinue IV cefepime on day 3 -Blood and urine cultures pending, no growth at three days -trend CBC  Acute Kidney Injury Acidosis Oliguria-resolved Creatinine is improving today, trending to 3.83 with peak of 5.84 10/24. BUN is also trending down from 80 to 67 today.  Acidosis slowly improving with CO2 of 20.  His presentation likely pre renal with patient presenting dehydrated or due to blood pressure medications leading to hypotension.  Concern for Acute Tubular Necrosis due to possible hypotension.  Patient had urine output of -3L yesterday, this could be secondary to post- ATN diuresis.  He was given LR bolus and will continue to monitor UOP.  -Neurology following, appreciate their recommendations -Continue to monitor renal function, trending BMP today, monitoring for post-ATN diuresis -Monitor Urine output -Continue LR 100 cc/hr -LR bolus 1 L given, hold further bolus  EKG changes Prolonged Qtc HFpEF Echo showed left ventricular EF of 60 to 65% with no wall motion abnormalities with mild concentric left ventricular hypertrophy.  Initially concerned that patient could have had a cardiac event leading  to being found down and ensuing AKI and encephalopathy.    -Telemetry monitoring -holding home medication of Lasix 20 mg until creatinine improves  Hypovolemic Hyponatremia-resolved Sodium at 137 today.    -BMP trend  Normocytic anemia Hemoglobin remains stable at 8.9.  This appears to be around his baseline. Ganzoni equation calculation of 1019 mg iron deficiency. Patient currently has infective process of left knee joint, will consider iron supplementation once infection resolves.  -Monitor CBC  Alcohol use disorder Patient unable to quantify alcohol use history nor is he able to state when his last drink was. Currently trying to limit sedating medications, at home patient is on lyrica and Carbamazepine 200 mg AM and 300 mg  PM.    -Ciwa protocol without ativan -Holding sedating medications, including home medications of lyrica and carbamazepine. -thiamine 101 mg -Folic acid 1 mg  Type 2 diabetes with peripheral neuropathy Hypoglycemia, improving Initially presented with low BG, home insulin held. Glucose has remained between 120-140s today. Confirmed carbamazepine prescribed for neuropathy, not seizures, per [facility] provider.  -CBGs q 4 hours -Continue holding home insulin given hypoglycemia -holding carbamazepime due to sedating effects  Hypertension Home medications include Amlodipine 10 mg, Hydralazine 50 mg TID, and Metoprolol 24 hr 200 mg.  Blood pressure was 112/57 on initial presentation to ED. patient's blood pressure is trending up, systolic 751-025E.  Will add amlodipine.  Patient is currently n.p.o., pharmacy stated that amlodipine should be crushed and given via core tech feeding tube.  -Restarted amlodipine 2.5 mg (crush and place in Cortrak), will monitor -Holding Hydralazine, Metoprolol, Lasix  Hyperlipidemia Lipid panel showed triglycerides of 208 and LDL not able to be calculated.   COPD Albuterol inhaler at home.  PAD s/p bilateral BKAs Left BKA in 2017  and right BKA in 2019.  -On aspirin 81 mg and pentoxifylline 400 mg 3 times daily at home, will restart aspirin to be given through feeding tube.  Best Practice: Diet: NPO due to altered mental status IVF: Fluids: LR, Rate:  150 cc/hr x 20 hrs VTE: heparin injection 5,000 Units Start: 08/19/21 1400 Code: Full AB: vancomycin day 3 DISPO: Anticipated discharge pending Medical stability.  Signature: Christiana Fuchs, D.O. Internal Medicine Resident, PGY-1 [facility]  Pager: [phone] 6:21 AM, 08/21/2021   Please contact the on call pager after 5 pm and on weekends at [phone].

## 2021-08-21 NOTE — Progress Notes (Signed)
PT Cancellation Note  Patient Details Name: BROGHAN PANNONE MRN: [id number] DOB: 1946/09/09   Cancelled Treatment:    Reason Eval/Treat Not Completed: Fatigue/lethargy limiting ability to participate, pt remains obtunded. Will follow-up for PT Evaluation as appropriate.  Mabeline Caras, PT, DPT Acute Rehabilitation Services  Pager [phone] Office [place] 08/21/2021, 9:53 AM

## 2021-08-21 NOTE — Care Management Important Message (Signed)
Important Message  Patient Details  Name: Leslie [name] MRN: [id number] Date of Birth: 1946-04-12   Medicare Important Message Given:  Yes     Orbie Pyo 08/21/2021, 2:06 PM

## 2021-08-21 NOTE — Anesthesia Postprocedure Evaluation (Signed)
Anesthesia Post Note  Patient: Leslie [name]  Procedure(s) Performed: IRRIGATION AND DEBRIDEMENT LEFT KNEE (Left: Knee)     Patient location during evaluation: PACU Anesthesia Type: General Level of consciousness: awake and alert Pain management: pain level controlled Vital Signs Assessment: post-procedure vital signs reviewed and stable Respiratory status: spontaneous breathing, nonlabored ventilation, respiratory function stable and patient connected to nasal cannula oxygen Cardiovascular status: blood pressure returned to baseline and stable Postop Assessment: no apparent nausea or vomiting Anesthetic complications: no   No notable events documented.  Last Vitals:  Vitals:   08/21/21 0316 08/21/21 0828  BP: (!) 127/116 (!) 141/64  Pulse: 80 81  Resp: 20 20  Temp: 37.2 C 37.1 C  SpO2: 100% 99%    Last Pain:  Vitals:   08/21/21 0828  TempSrc: Oral  PainSc:                  [place]

## 2021-08-21 NOTE — Consult Note (Signed)
[name] for Infectious Disease    Date of Admission:  08/18/2021     Total days of antibiotics 4               Reason for Consult: Septic arthritis   Referring Provider: Dr. Saverio Danker Primary Care Provider: [facility] [place]   ASSESSMENT:  Leslie [name] is a 75 y/o caucasian gentleman admitted with altered mental status and found to have MSSA septic arthritis of the left knee s/p I&D and complicated by acute kidney injury and encephalopathy. Blood cultures have remained without growth to date. Has a history of alcohol use although ETOH levels were undetectable on admission. Will narrow antibiotics down Cefazolin for MSSA infection. Recommend 6 weeks of treatment with disposition determining route of antibiotics with IV being preferential. Nephrology following for AKI with no indications for dialysis. Continue to monitor blood cultures for presence of bacteremia. Post-op wound care per orthopedics. Remaining medical and supportive care per primary team.   PLAN:  Narrow antibiotics to Cefazolin for MSSA septic arthritis Wound care per orthopedics Monitor blood cultures for presence of bacteremia. Will need prolonged therapy with mental status and disposition to determine route of treatment. Remaining medical and supportive care per primary team.    Active Problems:   Acute renal failure (HCC)   Altered mental status   S/P debridement   Septic arthritis of knee (HCC)   MSSA (methicillin susceptible Staphylococcus aureus) infection    [START ON 08/22/2021] amLODipine  2.5 mg Oral Daily   aspirin  81 mg Oral Daily   cholecalciferol  1,000 Units Oral Daily   heparin  5,000 Units Subcutaneous Q8H   hydrocerin   Topical BID   thiamine  100 mg Intravenous Daily     HPI: Leslie [name] is a 75 y.o. male with previous medical history of hypertension, Type 2 diabetes, COPD, peripheral vascular disease s/p bilateral BKAs and alcohol use admitted with  altered mental status after being found down at home for unclear amount of time.   Leslie [name] presented to the ED with altered mental status and per EMS lives alone and at baseline is independent. EMS activated because he had not been heard from for a couple of days. On arrival to ED was oriented only to self. Lab work showed hyponatremia with sodium of 126, creatinine 5.68, WBC count of 27.1, and lactic acid of 2.7. No acute findings on either chest x-ray or CT head. Left knee x-ray with BKA and mild soft tissue edema and no evidence of osteomyelitis. US abdomen was unremarkable.  Started on broad spectrum antibiotics with vancomycin, cefepime and metronidazole with suspected sepsis.   Orthopedics consulted given left knee pain and aspiration revealed frank purulence. Open arthrotomy was performed on 08/19/21 encountering a large purulent effusion. Returning about 8 cc of fluid. Synovial fluid of the knee and surgical specimen growing MSSA. Blood cultures on 08/18/21 have been without growth to date. Renal following for AKI.   Unable to obtain meaningful history information from Leslie [name] as he remains with altered mental status.   Review of Systems: Review of Systems  Unable to perform ROS: Mental status change    Past Medical History:  Diagnosis Date   Anxiety    Panic attack - 10  years aago- ? panic    Asthma    Cellulitis of left foot 2016   Healed and recurred 01/2016   Cellulitis of right foot 06/30/2012   Chronic  lower back pain 07/01/2012   COPD (chronic obstructive pulmonary disease) ([place])    Uses nebulizers at home. Former smoker, current chewing tobacco. 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   GERD (gastroesophageal reflux disease)    Gouty arthritis 07/01/2012   Not attacks in ~5 yrs (03/2016)   Hepatitis 1967   "in jail when I got it; dr said it was pretty bad; quaranteened X 30d"   Hyperlipidemia    Hypertension    Lung nodule, solitary 07/2015   Due for 1-yr recheck in 07/2017   Peripheral  arterial disease ([place])    critical limb ischemia   Peripheral neuropathy 07/01/2012   Seasonal allergies    Skin growth 07/01/2012   right side of nares; "been on there 20 years"   Substance abuse ([place])    Type II diabetes mellitus ([place]) 2002   Diagnosed in 2002    Social History   Tobacco Use   Smoking status: Former    Packs/day: 2.00    Years: 40.00    Pack years: 80.00    Types: Cigarettes    Quit date: 01/13/2002    Years since quitting: 19.6   Smokeless tobacco: Current    Types: Chew   Tobacco comments:    chews a little tobacco  Vaping Use   Vaping Use: Never used  Substance Use Topics   Alcohol use: Not Currently    Comment: 03/03/18: "Used to drink alot. No alcohol in a long time."   Drug use: No    Types: Marijuana    Comment: 03/27/2016 "last marijuana was maybe in 2013"    Family History  Problem Relation Age of Onset   Diabetes Sister    Hypertension Mother     Allergies  Allergen Reactions   Atorvastatin Other (See Comments)    Unknown reaction   Statins Other (See Comments)    Unknown reaction - on MAR   Flexeril [Cyclobenzaprine] Other (See Comments)    Dry mouth and hallucinations    OBJECTIVE: Blood pressure (!) 163/71, pulse 82, temperature 97.9 F (36.6 C), temperature source Oral, resp. rate 17, weight 90.1 kg, SpO2 99 %.  Physical Exam Constitutional:      General: He is not in acute distress.    Appearance: He is well-developed.     Interventions: He is restrained.     Comments: Lying in bed; encephalopathic  Cardiovascular:     Rate and Rhythm: Normal rate and regular rhythm.     Heart sounds: Normal heart sounds.  Pulmonary:     Effort: Pulmonary effort is normal.     Breath sounds: Normal breath sounds.  Skin:    General: Skin is warm and dry.    Lab Results Lab Results  Component Value Date   WBC 16.5 (H) 08/21/2021   HGB 8.9 (L) 08/21/2021   HCT 28.0 (L) 08/21/2021   MCV 86.2 08/21/2021   PLT 343 08/21/2021    Lab  Results  Component Value Date   CREATININE 3.83 (H) 08/21/2021   BUN 67 (H) 08/21/2021   NA 137 08/21/2021   K 4.6 08/21/2021   CL 102 08/21/2021   CO2 20 (L) 08/21/2021    Lab Results  Component Value Date   ALT 14 08/19/2021   AST 43 (H) 08/19/2021   ALKPHOS 192 (H) 08/19/2021   BILITOT 2.3 (H) 08/19/2021     Microbiology: Recent Results (from the past 240 hour(s))  Resp Panel by RT-PCR (Flu A&B, Covid) Nasopharyngeal Swab     Status:  None   Collection Time: 08/18/21  7:36 PM   Specimen: Nasopharyngeal Swab; Nasopharyngeal(NP) swabs in vial transport medium  Result Value Ref Range Status   SARS Coronavirus 2 by RT PCR NEGATIVE NEGATIVE Final    Comment: (NOTE) SARS-CoV-2 target nucleic acids are NOT DETECTED.  The SARS-CoV-2 RNA is generally detectable in upper respiratory specimens during the acute phase of infection. The lowest concentration of SARS-CoV-2 viral copies this assay can detect is 138 copies/mL. A negative result does not preclude SARS-Cov-2 infection and should not be used as the sole basis for treatment or other patient management decisions. A negative result may occur with  improper specimen collection/handling, submission of specimen other than nasopharyngeal swab, presence of viral mutation(s) within the areas targeted by this assay, and inadequate number of viral copies(<138 copies/mL). A negative result must be combined with clinical observations, patient history, and epidemiological information. The expected result is Negative.  Fact Sheet for Patients:  EntrepreneurPulse.com.au  Fact Sheet for Healthcare Providers:  IncredibleEmployment.be  This test is no t yet approved or cleared by the [place] FDA and  has been authorized for detection and/or diagnosis of SARS-CoV-2 by FDA under an Emergency Use Authorization (EUA). This EUA will remain  in effect (meaning this test can be used) for the duration of  the COVID-19 declaration under Section 564(b)(1) of the Act, 21 U.S.C.section 360bbb-3(b)(1), unless the authorization is terminated  or revoked sooner.       Influenza A by PCR NEGATIVE NEGATIVE Final   Influenza B by PCR NEGATIVE NEGATIVE Final    Comment: (NOTE) The Xpert Xpress SARS-CoV-2/FLU/RSV plus assay is intended as an aid in the diagnosis of influenza from Nasopharyngeal swab specimens and should not be used as a sole basis for treatment. Nasal washings and aspirates are unacceptable for Xpert Xpress SARS-CoV-2/FLU/RSV testing.  Fact Sheet for Patients: EntrepreneurPulse.com.au  Fact Sheet for Healthcare Providers: IncredibleEmployment.be  This test is not yet approved or cleared by the [place] FDA and has been authorized for detection and/or diagnosis of SARS-CoV-2 by FDA under an Emergency Use Authorization (EUA). This EUA will remain in effect (meaning this test can be used) for the duration of the COVID-19 declaration under Section 564(b)(1) of the Act, 21 U.S.C. section 360bbb-3(b)(1), unless the authorization is terminated or revoked.  Performed at [facility], [place] [address], [place], [place] [address]   Urine Culture     Status: None   Collection Time: 08/18/21  7:36 PM   Specimen: In/Out Cath Urine  Result Value Ref Range Status   Specimen Description IN/OUT CATH URINE  Final   Special Requests NONE  Final   Culture   Final    NO GROWTH Performed at [facility], [place] [address], [place], [place] [address]    Report Status 08/20/2021 FINAL  Final  Blood Culture (routine x 2)     Status: None (Preliminary result)   Collection Time: 08/18/21  8:10 PM   Specimen: BLOOD  Result Value Ref Range Status   Specimen Description BLOOD LEFT ARM  Final   Special Requests   Final    BOTTLES DRAWN AEROBIC AND ANAEROBIC Blood Culture adequate volume   Culture   Final    NO GROWTH 3 DAYS Performed at [facility], [place] [address] [place], [place] [address]    Report Status PENDING  Incomplete  Blood Culture (routine x 2)     Status: None (Preliminary result)  Collection Time: 08/18/21  8:12 PM   Specimen: BLOOD RIGHT HAND  Result Value Ref Range Status   Specimen Description BLOOD RIGHT HAND  Final   Special Requests   Final    BOTTLES DRAWN AEROBIC AND ANAEROBIC Blood Culture adequate volume   Culture   Final    NO GROWTH 3 DAYS Performed at [facility], [address] [place], [place] [address]    Report Status PENDING  Incomplete  Body fluid culture w Gram Stain     Status: None (Preliminary result)   Collection Time: 08/19/21  9:28 AM   Specimen: Body Fluid  Result Value Ref Range Status   Specimen Description FLUID  Final   Special Requests SYNOV KNEE  Final   Gram Stain   Final    NO ORGANISMS SEEN SQUAMOUS EPITHELIAL CELLS PRESENT MODERATE WBC PRESENT, PREDOMINANTLY PMN MODERATE GRAM POSITIVE COCCI    Culture FEW STAPHYLOCOCCUS AUREUS  Final   Report Status PENDING  Incomplete   Organism ID, Bacteria STAPHYLOCOCCUS AUREUS  Final      Susceptibility   Staphylococcus aureus - MIC*    CIPROFLOXACIN <=0.5 SENSITIVE Sensitive     ERYTHROMYCIN >=8 RESISTANT Resistant     GENTAMICIN <=0.5 SENSITIVE Sensitive     OXACILLIN 0.5 SENSITIVE Sensitive     TETRACYCLINE <=1 SENSITIVE Sensitive     VANCOMYCIN 1 SENSITIVE Sensitive     TRIMETH/SULFA <=10 SENSITIVE Sensitive     CLINDAMYCIN RESISTANT Resistant     RIFAMPIN <=0.5 SENSITIVE Sensitive     Inducible Clindamycin Value in next row Resistant      POSITIVEPerformed at [facility], [address], [place], [place] [address]    * FEW STAPHYLOCOCCUS AUREUS  Aerobic/Anaerobic Culture w Gram Stain (surgical/deep wound)     Status: None (Preliminary result)   Collection Time: 08/19/21  7:22 PM   Specimen: Synovial, Left Knee; Body Fluid  Result Value Ref Range Status   Specimen Description KNEE LEFT SYNOVIAL  FLUID  Final   Special Requests CUP ID A  Final   Gram Stain   Final    NO ORGANISMS SEEN SQUAMOUS EPITHELIAL CELLS PRESENT ABUNDANT WBC PRESENT,BOTH PMN AND MONONUCLEAR FEW GRAM POSITIVE COCCI Performed at [facility], [place] [address] [place], [place] [address]    Culture   Final    MODERATE STAPHYLOCOCCUS AUREUS SUSCEPTIBILITIES TO FOLLOW NO ANAEROBES ISOLATED; CULTURE IN PROGRESS FOR 5 DAYS    Report Status PENDING  Incomplete     Terri Piedra, NP [place] [facility]  08/21/2021  2:53 PM

## 2021-08-22 ENCOUNTER — Inpatient Hospital Stay (HOSPITAL_COMMUNITY): Payer: Medicare (Managed Care)

## 2021-08-22 DIAGNOSIS — N171 Acute kidney failure with acute cortical necrosis: Secondary | ICD-10-CM | POA: Diagnosis not present

## 2021-08-22 DIAGNOSIS — M00062 Staphylococcal arthritis, left knee: Secondary | ICD-10-CM | POA: Diagnosis not present

## 2021-08-22 DIAGNOSIS — A4901 Methicillin susceptible Staphylococcus aureus infection, unspecified site: Secondary | ICD-10-CM | POA: Diagnosis not present

## 2021-08-22 DIAGNOSIS — R4182 Altered mental status, unspecified: Secondary | ICD-10-CM | POA: Diagnosis not present

## 2021-08-22 DIAGNOSIS — Z9889 Other specified postprocedural states: Secondary | ICD-10-CM

## 2021-08-22 LAB — HEPATIC FUNCTION PANEL
ALT: 20 U/L (ref 0–44)
AST: 65 U/L — ABNORMAL HIGH (ref 15–41)
Albumin: 2.2 g/dL — ABNORMAL LOW (ref 3.5–5.0)
Alkaline Phosphatase: 181 U/L — ABNORMAL HIGH (ref 38–126)
Bilirubin, Direct: 1.2 mg/dL — ABNORMAL HIGH (ref 0.0–0.2)
Indirect Bilirubin: 2 mg/dL — ABNORMAL HIGH (ref 0.3–0.9)
Total Bilirubin: 3.2 mg/dL — ABNORMAL HIGH (ref 0.3–1.2)
Total Protein: 7.5 g/dL (ref 6.5–8.1)

## 2021-08-22 LAB — CBC
HCT: 28.6 % — ABNORMAL LOW (ref 39.0–52.0)
Hemoglobin: 9.2 g/dL — ABNORMAL LOW (ref 13.0–17.0)
MCH: 27.6 pg (ref 26.0–34.0)
MCHC: 32.2 g/dL (ref 30.0–36.0)
MCV: 85.9 fL (ref 80.0–100.0)
Platelets: 362 10*3/uL (ref 150–400)
RBC: 3.33 MIL/uL — ABNORMAL LOW (ref 4.22–5.81)
RDW: 16.8 % — ABNORMAL HIGH (ref 11.5–15.5)
WBC: 16.8 10*3/uL — ABNORMAL HIGH (ref 4.0–10.5)
nRBC: 0.1 % (ref 0.0–0.2)

## 2021-08-22 LAB — LIPASE, BLOOD: Lipase: 73 U/L — ABNORMAL HIGH (ref 11–51)

## 2021-08-22 LAB — GLUCOSE, CAPILLARY
Glucose-Capillary: 171 mg/dL — ABNORMAL HIGH (ref 70–99)
Glucose-Capillary: 184 mg/dL — ABNORMAL HIGH (ref 70–99)
Glucose-Capillary: 180 mg/dL — ABNORMAL HIGH (ref 70–99)

## 2021-08-22 LAB — RENAL FUNCTION PANEL
Albumin: 2.2 g/dL — ABNORMAL LOW (ref 3.5–5.0)
Anion gap: 16 — ABNORMAL HIGH (ref 5–15)
BUN: 56 mg/dL — ABNORMAL HIGH (ref 8–23)
CO2: 20 mmol/L — ABNORMAL LOW (ref 22–32)
Calcium: 9 mg/dL (ref 8.9–10.3)
Chloride: 104 mmol/L (ref 98–111)
Creatinine, Ser: 3.2 mg/dL — ABNORMAL HIGH (ref 0.61–1.24)
GFR, Estimated: 19 mL/min — ABNORMAL LOW (ref >60)
Glucose, Bld: 164 mg/dL — ABNORMAL HIGH (ref 70–99)
Phosphorus: 4.5 mg/dL (ref 2.5–4.6)
Potassium: 4.2 mmol/L (ref 3.5–5.1)
Sodium: 140 mmol/L (ref 135–145)

## 2021-08-22 LAB — BODY FLUID CULTURE W GRAM STAIN

## 2021-08-22 MED ORDER — POLYETHYLENE GLYCOL 3350 17 G PO PACK: 17.0000 g | PACK | Freq: Every day | ORAL | Status: DC

## 2021-08-22 MED ORDER — PROSOURCE TF PO LIQD
45.0000 mL | Freq: Two times a day (BID) | ORAL | Status: DC
  Administered 2021-08-22 – 2021-08-23 (×3): 45 mL
  Filled 2021-08-22 (×3): qty 45

## 2021-08-22 MED ORDER — SENNOSIDES-DOCUSATE SODIUM 8.6-50 MG PO TABS
1.0000 | ORAL_TABLET | Freq: Every day | ORAL | Status: DC
  Administered 2021-08-22: 1 via NASOGASTRIC
  Filled 2021-08-22: qty 1

## 2021-08-22 MED ORDER — INSULIN GLARGINE-YFGN 100 UNIT/ML ~~LOC~~ SOLN
5.0000 [IU] | Freq: Every day | SUBCUTANEOUS | Status: DC
  Filled 2021-08-22: qty 0.05

## 2021-08-22 MED ORDER — AMLODIPINE BESYLATE 5 MG PO TABS: 5.0000 mg | ORAL_TABLET | Freq: Every day | ORAL | Status: DC

## 2021-08-22 MED ORDER — ACETAMINOPHEN 160 MG/5ML PO SOLN
650.0000 mg | Freq: Two times a day (BID) | ORAL | Status: DC
  Administered 2021-08-22 – 2021-08-23 (×2): 650 mg via NASOGASTRIC
  Filled 2021-08-22 (×2): qty 20.3

## 2021-08-22 MED ORDER — AMLODIPINE BESYLATE 5 MG PO TABS
5.0000 mg | ORAL_TABLET | Freq: Every day | ORAL | Status: DC
  Administered 2021-08-22 – 2021-08-23 (×2): 5 mg via NASOGASTRIC
  Filled 2021-08-22 (×2): qty 1

## 2021-08-22 MED ORDER — FREE WATER
150.0000 mL | Status: DC
  Administered 2021-08-22 – 2021-08-23 (×7): 150 mL

## 2021-08-22 MED ORDER — JEVITY 1.2 CAL PO LIQD
1000.0000 mL | ORAL | Status: DC
  Administered 2021-08-22: 1000 mL
  Filled 2021-08-22 (×3): qty 1000

## 2021-08-22 MED ORDER — OSMOLITE 1.2 CAL PO LIQD
1000.0000 mL | ORAL | Status: DC
  Filled 2021-08-22: qty 1000

## 2021-08-22 MED ORDER — LIDOCAINE VISCOUS HCL 2 % MT SOLN
6.0000 mL | Freq: Once | OROMUCOSAL | Status: AC
  Administered 2021-08-22: 6 mL via OROMUCOSAL
  Filled 2021-08-22: qty 15

## 2021-08-22 MED ORDER — DIATRIZOATE MEGLUMINE & SODIUM 66-10 % PO SOLN
15.0000 mL | Freq: Once | ORAL | Status: AC
  Administered 2021-08-22: 15 mL
  Filled 2021-08-22: qty 30

## 2021-08-22 NOTE — Progress Notes (Signed)
Pt arrived back to 4E from [facility]. VSS. Call light in reach.  Raelyn Number, RN

## 2021-08-22 NOTE — Progress Notes (Signed)
Subjective: 3 Days Post-Op Procedure(s) (LRB): IRRIGATION AND DEBRIDEMENT LEFT KNEE (Left) Patient reports pain as mild.   Patient seen in rounds for Dr. Alvan Dame. Patient is resting in bed this morning. He has his eyes open, but is non-communicative.    Objective: Vital signs in last 24 hours: Temp:  [97.9 F (36.6 C)-98.7 F (37.1 C)] 98.6 F (37 C) (10/27 0331) Pulse Rate:  [55-100] 98 (10/27 0331) Resp:  [17-20] 20 (10/27 0331) BP: (141-165)/(48-120) 157/82 (10/27 0331) SpO2:  [96 %-99 %] 98 % (10/27 0331) Weight:  [87.9 kg] 87.9 kg (10/27 0331)  Intake/Output from previous day:  Intake/Output Summary (Last 24 hours) at 08/22/2021 0736 Last data filed at 08/22/2021 0333 Gross per 24 hour  Intake 0 ml  Output 800 ml  Net -800 ml     Intake/Output this shift: No intake/output data recorded.  Labs: Recent Labs    08/20/21 0147 08/21/21 0217 08/22/21 0136  HGB 8.9* 8.9* 9.2*   Recent Labs    08/21/21 0217 08/22/21 0136  WBC 16.5* 16.8*  RBC 3.25* 3.33*  HCT 28.0* 28.6*  PLT 343 362   Recent Labs    08/21/21 1644 08/22/21 0136  NA 140 140  K 4.5 4.2  CL 103 104  CO2 22 20*  BUN 57* 56*  CREATININE 3.20* 3.20*  GLUCOSE 170* 164*  CALCIUM 8.9 9.0   No results for input(s): LABPT, INR in the last 72 hours.  Exam: General - Patient has eyes open Extremity -  Bilateral BKA Left knee dressing has been changed to aquacel 10in Dressing - dressing C/D/I   Past Medical History:  Diagnosis Date   Anxiety    Panic attack - 10  years aago- ? panic    Asthma    Cellulitis of left foot 2016   Healed and recurred 01/2016   Cellulitis of right foot 06/30/2012   Chronic lower back pain 07/01/2012   COPD (chronic obstructive pulmonary disease) ([place])    Uses nebulizers at home. Former smoker, current chewing tobacco. 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   GERD (gastroesophageal reflux disease)    Gouty arthritis 07/01/2012   Not attacks in ~5 yrs (03/2016)   Hepatitis 1967    "in jail when I got it; dr said it was pretty bad; quaranteened X 30d"   Hyperlipidemia    Hypertension    Lung nodule, solitary 07/2015   Due for 1-yr recheck in 07/2017   Peripheral arterial disease ([place])    critical limb ischemia   Peripheral neuropathy 07/01/2012   Seasonal allergies    Skin growth 07/01/2012   right side of nares; "been on there 20 years"   Substance abuse ([place])    Type II diabetes mellitus ([place]) 2002   Diagnosed in 2002    Assessment/Plan: 3 Days Post-Op Procedure(s) (LRB): IRRIGATION AND DEBRIDEMENT LEFT KNEE (Left) Principal Problem:   Septic arthritis of knee (HCC) Active Problems:   Acute renal failure (HCC)   Altered mental status   S/P debridement   MSSA (methicillin susceptible Staphylococcus aureus) infection  Estimated body mass index is 31.28 kg/m as calculated from the following:   Height as of 06/21/18: 5\' 6"  (1.676 m).   Weight as of this encounter: 87.9 kg.   DVT Prophylaxis - Aspirin  Planned to change aquacel to larger dressing as it was pulling up, but has been changed. I appreciate this.  Culture grew staph aureus. Continue medical management. Appreciate ID recommendations.   Griffith Citron, PA-C Orthopedic Surgery [phone])  [phone] 08/22/2021, 7:36 AM

## 2021-08-22 NOTE — Progress Notes (Signed)
Initial Nutrition Assessment  DOCUMENTATION CODES:   Not applicable  INTERVENTION:   Initiate tube feeding via Cortrak tube: Jevity 1.2 at 20 ml/h, increase by 10 ml every 4 hours to goal rate of 70 ml/h (1680 ml per day) Prosource TF 45 ml BID  Provides 2096 kcal, 115 gm protein, 1361 ml free water daily.  Free water flushes 150 ml every 4 hours for a total of 2261 ml free water daily.  NUTRITION DIAGNOSIS:   Inadequate oral intake related to dysphagia as evidenced by NPO status.  GOAL:   Patient will meet greater than or equal to 90% of their needs  MONITOR:   TF tolerance, Labs, Diet advancement  REASON FOR ASSESSMENT:   Consult Enteral/tube feeding initiation and management  ASSESSMENT:   75 yo male admitted with septic L knee, acute encephalopathy c/b AKI. PMH includes HTN, HLD, DM-2, COPD, bilateral BKAs, alcohol use disorder, PAD, hepatitis.  S/P I&D of left knee on 10/24.  SLP following for dysphagia. Patient is not safe for POs at this time. Cortrak was placed 10/26. Repositioned in IR this morning, tip is now in the duodenum. Per discussion with attending physician team, okay to begin TF today, RD to order.  Patient unable to provide any nutrition history at this time.  Labs reviewed. Alk phos 181, total Bili 3.2 CBG: 151-171  Medications reviewed and include Senokot-S, thiamine.  No recent weights available for comparison to current weight.  NUTRITION - FOCUSED PHYSICAL EXAM:  Flowsheet Row Most Recent Value  Orbital Region No depletion  Upper Arm Region No depletion  Thoracic and Lumbar Region No depletion  Buccal Region Mild depletion  Temple Region No depletion  Clavicle Bone Region Mild depletion  Clavicle and Acromion Bone Region No depletion  Scapular Bone Region No depletion  Dorsal Hand No depletion  Patellar Region Unable to assess  Anterior Thigh Region Unable to assess  Posterior Calf Region Unable to assess  Edema (RD  Assessment) Unable to assess  Hair Reviewed  Eyes Reviewed  Mouth Reviewed  Skin Reviewed  Nails Reviewed       Diet Order:   Diet Order             Diet NPO time specified  Diet effective now                   EDUCATION NEEDS:   Not appropriate for education at this time  Skin:  Skin Assessment: Reviewed RN Assessment (L leg incision)  Last BM:  no BM documented  Height:   Ht Readings from Last 1 Encounters:  06/21/18 5' 6"  (1.676 m)    Weight:   Wt Readings from Last 1 Encounters:  08/22/21 87.9 kg    BMI:  Body mass index is 35.97 kg/m (adjusted for bilateral BKAs)  Estimated Nutritional Needs:   Kcal:  1900-2100  Protein:  105-125 gm  Fluid:  >/= 2.1 L    Lucas Mallow, RD, LDN, CNSC Please refer to Amion for contact information.

## 2021-08-22 NOTE — Progress Notes (Signed)
Speech Language Pathology Treatment: Dysphagia  Patient Details Name: Leslie [name] MRN: [id number] DOB: [birth year] Today's Date: 08/22/2021 Time: 2706-2376 SLP Time Calculation (min) (ACUTE ONLY): 15 min  Assessment / Plan / Recommendation Clinical Impression  Pt with improvements noted in overall alertness, as compared to prior SLP encounter, however persists with a cognitive based dysphagia. Provided diligent oral care; xerostomia exhibited with poor patient mangement of saliva. Oral hygiene improved some s/p oral care by SLP. Trialed single ice chips by teaspoon, pt with some variable oral readiness, some bolus manipulation and mastication however palpable swallow only exhibited in 2/4 trials. No overt coughing indicated however no further PO trials given due to clinical deficits observed. Will continue to follow for PO readiness. Continue NPO with meds via alternative means and diligent oral care.    HPI HPI: 75 yo person living with HTN, HLD, T2DM, COPD, PVD s/p bilateral BKAs, and alcohol use disorder here for altered mental status, found down at home for an unclear amount of time although a neighbor reports not hearing from him in two days. Taken to OR 10/25 for septic native left knee s/p irrigation and debridement. Pt s/p cortrak placement.      SLP Plan  Continue with current plan of care      Recommendations for follow up therapy are one component of a multi-disciplinary discharge planning process, led by the attending physician.  Recommendations may be updated based on patient status, additional functional criteria and insurance authorization.    Recommendations  Diet recommendations: NPO Medication Administration: Via alternative means                Oral Care Recommendations: Oral care QID;Staff/trained caregiver to provide oral care Follow up Recommendations: Other (comment) (TBD) SLP Visit Diagnosis: Dysphagia, oral phase (R13.11);Dysphagia, unspecified  (R13.10) Plan: Continue with current plan of care       [place], CCC-SLP Acute Rehabilitation Services    08/22/2021, 9:33 AM

## 2021-08-22 NOTE — Progress Notes (Signed)
PT Cancellation Note  Patient Details Name: Leslie [name] MRN: [id number] DOB: Dec 23, 1945   Cancelled Treatment:    Reason Eval/Treat Not Completed: Fatigue/lethargy limiting ability to participate;Patient not medically ready. Will sign off; please reconsult when appropriate to participate in PT Evaluation.  Mabeline Caras, PT, DPT Acute Rehabilitation Services  Pager [phone] Office [place] 08/22/2021, 9:09 AM

## 2021-08-22 NOTE — Progress Notes (Signed)
Subjective:   hemodynamically stable , actually hypertensive, at least  800  of UOP-  BUN and crt stable from yesterday-  maybe a little more alert, verbalizing some   Objective Vital signs in last 24 hours: Vitals:   08/22/21 0331 08/22/21 0745 08/22/21 0800 08/22/21 0900  BP: (!) 157/82 (!) 191/107 (!) 199/68 (!) 177/77  Pulse: 98 (!) 116 (!) 101 (!) 101  Resp: 20  17   Temp: 98.6 F (37 C) 98.5 F (36.9 C)    TempSrc: Axillary Oral    SpO2: 98% 100%  100%  Weight: 87.9 kg      Weight change: -2.2 kg  Intake/Output Summary (Last 24 hours) at 08/22/2021 1043 Last data filed at 08/22/2021 0973 Gross per 24 hour  Intake 0 ml  Output 800 ml  Net -800 ml    Assessment/Plan: 75 year old WM with altered MS and also with AKI 1.Renal- if crt was 1.5 in June ( reported in the chart but I have not been able to verify) -  this is either an acute or subacute presentation.   His U/A is bland so does not seem like GN-  u/s does not show obstruction.  It would fit with a pre renal insult-  possibly a low BP episode on his ARB that gave him an AKI.    I wondered if the AKI in addition to the meds he is taking-  specifically lyrica, narcotics and klonopin are what was responsible for some of his AMS-  also septic knee. There are still no indications for dialysis, renal function was improving slowly -  hold losartan as well as sedating agents and follow 2. Hypertension/volume  - he did not seen overloaded-  dark urine.  IVF initially - I think tank is  full-  IVF stopped.  Amlodipine started and titrated up per primary team  3. Metabolic acidosis-  not extreme -  no treatment for now 4. ID-  with elevated WBC-   found to have septic knee-  abx simplified to ancef -  WBC improving  5. Anemia  - hgb around 9 without trend-  follow 6. Dec MS- cannot explain-  BUN of 56 would not do-  I would think sedating meds would be out of system by now-  but actually got them Monday night  ? HCT negative for CVA on  admit- previously lived alone ?  Is improving but only VERY SLOWLY-  May need to get palliative on the case if cont to not be able to respond or make decisions    Louis Meckel    Labs: Basic Metabolic Panel: Recent Labs  Lab 08/21/21 0217 08/21/21 1644 08/22/21 0136  NA 137 140 140  K 4.6 4.5 4.2  CL 102 103 104  CO2 20* 22 20*  GLUCOSE 123* 170* 164*  BUN 67* 57* 56*  CREATININE 3.83* 3.20* 3.20*  CALCIUM 8.6* 8.9 9.0  PHOS 5.4*  --  4.5   Liver Function Tests: Recent Labs  Lab 08/18/21 1736 08/19/21 0529 08/21/21 0217 08/22/21 0136  AST 37 43*  --   --   ALT 14 14  --   --   ALKPHOS 168* 192*  --   --   BILITOT 1.8* 2.3*  --   --   PROT 6.7 7.1  --   --   ALBUMIN 2.0* 2.2* 1.9* 2.2*   No results for input(s): LIPASE, AMYLASE in the last 168 hours. No results for input(s):  AMMONIA in the last 168 hours. CBC: Recent Labs  Lab 08/18/21 1736 08/18/21 2323 08/19/21 0529 08/20/21 0147 08/21/21 0217 08/22/21 0136  WBC 27.1*  --  29.4* 18.0* 16.5* 16.8*  NEUTROABS 22.3*  --   --   --   --   --   HGB 9.2*   < > 9.6* 8.9* 8.9* 9.2*  HCT 28.2*   < > 29.7* 27.4* 28.0* 28.6*  MCV 84.7  --  85.8 86.4 86.2 85.9  PLT 403*  --  453* 381 343 362   < > = values in this interval not displayed.   Cardiac Enzymes: Recent Labs  Lab 08/18/21 1736  CKTOTAL 221   CBG: Recent Labs  Lab 08/21/21 0622 08/21/21 1113 08/21/21 1632 08/21/21 2107 08/22/21 0627  GLUCAP 133* 141* 140* 151* 171*    Iron Studies:  No results for input(s): IRON, TIBC, TRANSFERRIN, FERRITIN in the last 72 hours.  Studies/Results: DG Abd 1 View  Result Date: 08/22/2021 CLINICAL DATA:  Feeding tube adjustment EXAM: ABDOMEN - 1 VIEW COMPARISON:  Abdominal x-ray 08/21/2021 FINDINGS: Enteric tube tip is in the proximal duodenum with injected contrast visualized in the duodenum. IMPRESSION: As above. Electronically Signed   By: Ofilia Neas M.D.   On: 08/22/2021 08:57   DG Abd  Portable 1V  Result Date: 08/21/2021 CLINICAL DATA:  Feeding tube placement EXAM: PORTABLE ABDOMEN - 1 VIEW COMPARISON:  None. FINDINGS: Heart is enlarged. Atherosclerotic calcification of the aortic arch. Visualized lungs are clear. Visualized upper abdomen is unremarkable. Distal tip of the feeding tube projecting over the gastric fundus. IMPRESSION: Feeding tube with distal tip projecting over the gastric fundus, it could be advanced 5-8 cm. Electronically Signed   By: Keane Police D.O.   On: 08/21/2021 17:35   ECHOCARDIOGRAM COMPLETE  Result Date: 08/20/2021    ECHOCARDIOGRAM REPORT   Patient Name:   Leslie [name] Date of Exam: 08/20/2021 Medical Rec #:  [id number]       Height:       66.0 in Accession #:    [id number]      Weight:       202.4 lb Date of Birth:  [birth year]       BSA:          2.010 m Patient Age:    75 years        BP:           127/64 mmHg Patient Gender: M               HR:           76 bpm. Exam Location:  Inpatient Procedure: 2D Echo, Cardiac Doppler and Color Doppler Indications:    Abnl Ekg  History:        Patient has no prior history of Echocardiogram examinations.                 Risk Factors:Hypertension and Diabetes.  Sonographer:    Helmut Muster Referring Phys: [place]  1. Left ventricular ejection fraction, by estimation, is 60 to 65%. The left ventricle has normal function. The left ventricle has no regional wall motion abnormalities. There is mild concentric left ventricular hypertrophy. Left ventricular diastolic function could not be evaluated.  2. Right ventricular systolic function is normal. The right ventricular size is normal. Tricuspid regurgitation signal is inadequate for assessing PA pressure.  3. The mitral valve is degenerative. Trivial mitral valve regurgitation. Mild mitral stenosis. Moderate  to severe mitral annular calcification.  4. The aortic valve is grossly normal. Aortic valve regurgitation is not visualized. No aortic  stenosis is present.  5. The inferior vena cava is normal in size with greater than 50% respiratory variability, suggesting right atrial pressure of 3 mmHg. FINDINGS  Left Ventricle: Left ventricular ejection fraction, by estimation, is 60 to 65%. The left ventricle has normal function. The left ventricle has no regional wall motion abnormalities. The left ventricular internal cavity size was normal in size. There is  mild concentric left ventricular hypertrophy. Left ventricular diastolic function could not be evaluated due to mitral annular calcification (moderate or greater). Left ventricular diastolic function could not be evaluated. Right Ventricle: The right ventricular size is normal. No increase in right ventricular wall thickness. Right ventricular systolic function is normal. Tricuspid regurgitation signal is inadequate for assessing PA pressure. Left Atrium: Left atrial size was normal in size. Right Atrium: Right atrial size was normal in size. Pericardium: Trivial pericardial effusion is present. Mitral Valve: The mitral valve is degenerative in appearance. Moderate to severe mitral annular calcification. Trivial mitral valve regurgitation. Mild mitral valve stenosis. MV peak gradient, 10.5 mmHg. The mean mitral valve gradient is 5.0 mmHg. Tricuspid Valve: The tricuspid valve is grossly normal. Tricuspid valve regurgitation is not demonstrated. No evidence of tricuspid stenosis. Aortic Valve: The aortic valve is grossly normal. Aortic valve regurgitation is not visualized. No aortic stenosis is present. Pulmonic Valve: The pulmonic valve was grossly normal. Pulmonic valve regurgitation is not visualized. No evidence of pulmonic stenosis. Aorta: The aortic root and ascending aorta are structurally normal, with no evidence of dilitation. Venous: The right lower pulmonary vein is normal. The inferior vena cava is normal in size with greater than 50% respiratory variability, suggesting right atrial pressure  of 3 mmHg. IAS/Shunts: The atrial septum is grossly normal.  LEFT VENTRICLE PLAX 2D LVIDd:         3.90 cm      Diastology LVIDs:         2.70 cm      LV e' medial:    6.09 cm/s LV PW:         1.40 cm      LV E/e' medial:  26.1 LV IVS:        1.40 cm      LV e' lateral:   10.60 cm/s LVOT diam:     2.00 cm      LV E/e' lateral: 15.0 LV SV:         89 LV SV Index:   44 LVOT Area:     3.14 cm  LV Volumes (MOD) LV vol d, MOD A2C: 105.0 ml LV vol d, MOD A4C: 76.5 ml LV vol s, MOD A2C: 40.9 ml LV vol s, MOD A4C: 33.6 ml LV SV MOD A2C:     64.1 ml LV SV MOD A4C:     76.5 ml LV SV MOD BP:      52.8 ml RIGHT VENTRICLE             IVC RV S prime:     13.70 cm/s  IVC diam: 1.90 cm TAPSE (M-mode): 2.5 cm LEFT ATRIUM             Index        RIGHT ATRIUM           Index LA diam:        3.70 cm 1.84 cm/m   RA Area:  15.90 cm LA Vol (A2C):   59.6 ml 29.65 ml/m  RA Volume:   33.40 ml  16.62 ml/m LA Vol (A4C):   63.7 ml 31.69 ml/m LA Biplane Vol: 64.6 ml 32.14 ml/m  AORTIC VALVE LVOT Vmax:   123.00 cm/s LVOT Vmean:  83.000 cm/s LVOT VTI:    0.282 m  AORTA Ao Root diam: 3.80 cm Ao Asc diam:  3.90 cm MITRAL VALVE MV Area (PHT): 2.76 cm     SHUNTS MV Area VTI:   1.77 cm     Systemic VTI:  0.28 m MV Peak grad:  10.5 mmHg    Systemic Diam: 2.00 cm MV Mean grad:  5.0 mmHg MV Vmax:       1.62 m/s MV Vmean:      100.0 cm/s MV Decel Time: 275 msec MV E velocity: 159.00 cm/s MV A velocity: 134.00 cm/s MV E/A ratio:  1.19 Eleonore Chiquito MD Electronically signed by Eleonore Chiquito MD Signature Date/Time: 08/20/2021/12:34:20 PM    Final    Medications: Infusions:   ceFAZolin (ANCEF) IV 2 g (08/21/21 2110)    Scheduled Medications:  [START ON 08/23/2021] amLODipine  5 mg Per NG tube Daily   aspirin  81 mg Per NG tube Daily   heparin  5,000 Units Subcutaneous Q8H   hydrocerin   Topical BID   senna-docusate  1 tablet Per NG tube Daily   thiamine  100 mg Intravenous Daily    have reviewed scheduled and prn  medications.  Physical Exam: General:  mittens on -   eyes open-  is responding some to being worked on-  trying to verbalize some today Heart: RRR Lungs: poor effort Abdomen: obese, distended Extremities:  bilat BKA-  post op from knee I and D-  less red    08/22/2021,10:43 AM  LOS: 4 days

## 2021-08-22 NOTE — Progress Notes (Addendum)
 HD#4 SUBJECTIVE:  Patient Summary: Leslie [name] is a 74 y.o. with a pertinent PMH of HTN, HLD, T2DM, COPD, bilateral BKAs, alcohol use disorder, PAD, and a remote history of hepatitis, who presented with altered mental status and admitted for acute encephalopathy complicated by Acute Kidney Injury on 10/23, on hospital day 5. Patient has significant tenderness to left knee joint and arthrocentesis showed frank pus, patient was taken to OR for I and D 10/24.  Overnight Events:  NG tube placement difficulty overnight.  Patient's blood pressure has remained consistently elevated in the 160s to as high as 190s.  He has not been able to receive amlodipine.  Interim History:   Patient was assessed at bedside this AM.  He was calm, alert, and able to follow commands such as closing his eyes and sticking out his tongue.  He was able to state his name and that he was at [facility].  Not oriented to place or time.  His left knee and proximal thigh remained extremely tender.   OBJECTIVE:  Vital Signs: Vitals:   08/21/21 2055 08/21/21 2330 08/21/21 2331 08/22/21 0331  BP: (!) 156/104 (!) 152/120 (!) 152/120 (!) 157/82  Pulse: 98 100 100 98  Resp: 18 20 18 20  Temp: 98.6 F (37 C) 98.6 F (37 C) 98.6 F (37 C) 98.6 F (37 C)  TempSrc:  Axillary  Axillary  SpO2:  98%  98%  Weight:    87.9 kg   Supplemental O2: Nasal Cannula SpO2: 98 % O2 Flow Rate (L/min): 3 L/min  Filed Weights   08/20/21 0345 08/21/21 0316 08/22/21 0331  Weight: 91.8 kg 90.1 kg 87.9 kg     Intake/Output Summary (Last 24 hours) at 08/22/2021 0615 Last data filed at 08/22/2021 0333 Gross per 24 hour  Intake 200 ml  Output 800 ml  Net -600 ml    Net IO Since Admission: -1,323 mL [08/22/21 0615]  Physical Exam: General: chronically ill-appearing, elderly man HENT: NCAT Eyes: no scleral icterus, conjunctiva clear CV: no murmurs, normal rate Pulm: CTAB, pulmonary effort normal  GI: Diffuse  tenderness, soft, nondistended MSK: bilateral BKAs, blanching, erythema of left knee joint which is warm to touch and tender (picture in chart) Skin: warm and dry, skin on hands bilaterally with scaling, non blanching erythema, mittens in place Psych: Calm, able to follow some commands   Patient Lines/Drains/Airways Status     Active Line/Drains/Airways     Name Placement date Placement time Site Days   Peripheral IV 08/18/21 20 G Left;Posterior Wrist 08/18/21  1724  Wrist  1   Negative Pressure Wound Therapy Knee Anterior;Right 03/28/18  0845  --  1240   External Urinary Catheter 08/18/21  1854  --  1   Incision (Closed) 03/28/18 Leg Right 03/28/18  0839  -- 1240            Pertinent Labs: CBC Latest Ref Rng & Units 08/22/2021 08/21/2021 08/20/2021  WBC 4.0 - 10.5 K/uL 16.8(H) 16.5(H) 18.0(H)  Hemoglobin 13.0 - 17.0 g/dL 9.2(L) 8.9(L) 8.9(L)  Hematocrit 39.0 - 52.0 % 28.6(L) 28.0(L) 27.4(L)  Platelets 150 - 400 K/uL 362 343 381    CMP Latest Ref Rng & Units 08/22/2021 08/21/2021 08/21/2021  Glucose 70 - 99 mg/dL 164(H) 170(H) 123(H)  BUN 8 - 23 mg/dL 56(H) 57(H) 67(H)  Creatinine 0.61 - 1.24 mg/dL 3.20(H) 3.20(H) 3.83(H)  Sodium 135 - 145 mmol/L 140 140 137  Potassium 3.5 - 5.1 mmol/L 4.2 4.5 4.6    Chloride 98 - 111 mmol/L 104 103 102  CO2 22 - 32 mmol/L 20(L) 22 20(L)  Calcium 8.9 - 10.3 mg/dL 9.0 8.9 8.6(L)  Total Protein 6.5 - 8.1 g/dL - - -  Total Bilirubin 0.3 - 1.2 mg/dL - - -  Alkaline Phos 38 - 126 U/L - - -  AST 15 - 41 U/L - - -  ALT 0 - 44 U/L - - -    Recent Labs    08/21/21 1113 08/21/21 1632 08/21/21 2107  GLUCAP 141* 140* 151*      Pertinent Imaging: DG Abd Portable 1V  Result Date: 08/21/2021 CLINICAL DATA:  Feeding tube placement EXAM: PORTABLE ABDOMEN - 1 VIEW COMPARISON:  None. FINDINGS: Heart is enlarged. Atherosclerotic calcification of the aortic arch. Visualized lungs are clear. Visualized upper abdomen is unremarkable. Distal tip of  the feeding tube projecting over the gastric fundus. IMPRESSION: Feeding tube with distal tip projecting over the gastric fundus, it could be advanced 5-8 cm. Electronically Signed   By: Imran  Ahmed D.O.   On: 08/21/2021 17:35    ASSESSMENT/PLAN:  Assessment: Principal Problem:   Septic arthritis of knee (HCC) Active Problems:   Acute renal failure (HCC)   Altered mental status   S/P debridement   MSSA (methicillin susceptible Staphylococcus aureus) infection  Leslie [name] is a 74 y.o. with a pertinent PMH of HTN, HLD, T2DM, COPD, bilateral BKAs, alcohol use disorder, PAD, and a remote history of hepatitis, who presented with altered mental status and admitted for acute encephalopathy complicated by Acute Kidney Injury on 10/23, on hospital day 5. Patient has significant tenderness to left knee joint and arthrocentesis showed frank pus, patient was taken to OR for I and D 10/24.  Plan: Acute encephalopathy Mentation improving slowly today.  He is awake and able to follow some commands, but is not at his baseline.  He remains afebrile, leukocytosis stable from yesterday, creatinine and uremia improving, and electrolyte abnormalities resolved.  Patient received home medications that had been held by IM team on October 24 postoperatively, however this is unlikely to be continuing to affect mentation after 3 days.    -holding sedating medications -NPO -Cortrak placement, tube feeds initiated   MSSA septic arthritis of left knee s/p I&D Leukocytosis Cellulitis of left stump Open Arthrotomy completed 10/24 with large purulent effusion that grew MSSA.  His left knee remains swollen, warm, and tender to palpation.  He has pain up into his mid-femur.  Was following with Dr. Duda outpatient due to to cellulitis on left stump.  He has remained afrebrile and leukocytosis stable was at 16.5 yesterday to 16.8 today.  Yesterday antibiotics were de-escalated from Vancomycin and cefepime to  cefazolin.  -IV cefazolin 2g q 12 hours, day 2  -Appreciate assistance from orthopedic surgery  -Appreciate Infectious Disease recommendations -Received 3 days of vancomycin and cefepime, discontinued 10/26 -Blood and urine cultures pending, no growth -trend CBC -Tylenol 650 mg, BID for pain -Follow-up on CT left lower extremity  Acute Kidney Injury Acidosis Oliguria-resolved Creatinine is improving today, trending to 3.2 with peak of 5.84 10/24. BUN is also trending down from 80 to 56 today.  Acidosis slowly improving with CO2 of 20.  His presentation most concerning for pre renal with patient presenting dehydrated or due to blood pressure medications leading to hypotension.  Concern for possible ATN due to possible hypotension and prolonged prerenal state.  Urine output -800 mL yesterday.   -Nephrology following, appreciate their recommendations -Continue   to monitor renal function, trending BMP today, monitoring for post-ATN diuresis -Monitor Urine output -Discontinued LR maintenance fluids, FWF per RD -Continue to monitor renal function panel  Abdominal pain With diffuse tenderness encompassing with palpation to abdomen.  Not had a bowel movement since being hospitalized.  We will start bowel regimen as well as obtain imaging to further evaluate given underlying encephalopathy and inability to further qualify.  -Hepatic function panel, lipase -CT without contrast abdomen pelvis  EKG changes Prolonged Qtc HFpEF Echo showed left ventricular EF of 60 to 65% with no wall motion abnormalities with mild concentric left ventricular hypertrophy.  Initially concerned that patient could have had a cardiac event leading to being found down and ensuing AKI and encephalopathy.    -Telemetry monitoring -holding home medication of Lasix 20 mg until creatinine improves  Hypertension Home medications include Amlodipine 10 mg, Hydralazine 50 mg TID, and Metoprolol 24 hr 200 mg.  Blood pressure  was 112/57 on initial presentation to ED. Patient's blood pressure has trended up into the 150s to 190s systolic.  Added low-dose amlodipine 5 mg last night, however patient was unable to receive this due to not having been placed yet.  Feeding tube has been placed and increased amlodipine to 5 mg.   Patient is currently n.p.o., pharmacy stated that amlodipine should be crushed and given via Cortrak feeding tube.  -Amlodipine 5 mg (crush and place in Cortrak), will monitor -Holding Hydralazine, Metoprolol, Lasix  Hypovolemic Hyponatremia-resolved Sodium at 137 today.    -BMP trend  Normocytic anemia Hemoglobin remains stable at 9.2.  This appears to be around his baseline. Ganzoni equation calculation of 1019 mg iron deficiency. Patient currently has infective process of left knee joint, will consider iron supplementation once infection resolves.  -Monitor CBC  Alcohol use disorder Transaminitis Unsure of last drink, patient has multiple reasons for altered mental status.  Alk phos 181, AST 65, Albumin 2.2, Total Bilirubin 3.2- indirect 2 and direct 1.2.   -Ciwa protocol without ativan -Holding sedating medications, including home medications of lyrica and carbamazepine. -thiamine 100 mg -Folic acid 1 mg  Type 2 diabetes with peripheral neuropathy Hypoglycemia, improving Initially presented with low BG, home insulin held. Glucose trending up into the 180s today. Confirmed carbamazepine prescribed for neuropathy, not seizures, per [facility] provider.  -CBGs q 4 hours -will start Semglee 5 units  -holding carbamazepime due to sedating effects  Hyperlipidemia Lipid panel showed triglycerides of 208 and LDL not able to be calculated.   COPD Albuterol inhaler at home.  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  -On aspirin 81 mg and pentoxifylline 400 mg 3 times daily at home, will restart aspirin to be given through feeding tube.  Best Practice: Diet: NPO due  to altered mental status IVF: Fluids: LR, Rate:  150 cc/hr x 20 hrs VTE: heparin injection 5,000 Units Start: 08/19/21 1400 Code: Full AB: cefazolin, day 2    DISPO: Anticipated discharge pending Medical stability.  Signature:  , D.O. Internal Medicine Resident, PGY-1 [facility]  Pager: #[phone] 6:16 AM, 08/22/2021   Please contact the on call pager after 5 pm and on weekends at [phone].  

## 2021-08-22 NOTE — Progress Notes (Signed)
[facility]ious [facility]  Date of Admission:  08/18/2021     Total days of antibiotics 5         ASSESSMENT:  Leslie [name] appears to be slightly more awake but remains encephalopathic. Blood cultures remain without growth to date. Creatinine stable and followed by Nephrology. Continue wound care per Orthopedics. Monitor cultures for bacteremia. Continue current dose of Cefazolin. Further antibiotic recommendations pending clearance of encephalopathy and disposition. Remaining medical and supportive care per Primary Team.   PLAN:  Continue current dose of Cefazolin.  Monitor blood cultures for bacteremia. Wound care per orthopedics  Remaining medical and supportive care per primary team.   Principal Problem:   Septic arthritis of knee ([place]) Active Problems:   Acute renal failure (HCC)   Altered mental status   S/P debridement   MSSA (methicillin susceptible Staphylococcus aureus) infection    [START ON 08/23/2021] amLODipine  5 mg Per NG tube Daily   aspirin  81 mg Per NG tube Daily   feeding supplement (PROSource TF)  45 mL Per Tube BID   free water  150 mL Per Tube Q4H   heparin  5,000 Units Subcutaneous Q8H   hydrocerin   Topical BID   senna-docusate  1 tablet Per NG tube Daily   thiamine  100 mg Intravenous Daily    SUBJECTIVE:  Afebrile overnight with no acute events. Appears to be slightly more alert today with eyes open but does not communicate nor track.   Allergies  Allergen Reactions   Atorvastatin Other (See Comments)    Unknown reaction   Statins Other (See Comments)    Unknown reaction - on MAR   Flexeril [Cyclobenzaprine] Other (See Comments)    Dry mouth and hallucinations     Review of Systems: Review of Systems  Unable to perform ROS: Acuity of condition     OBJECTIVE: Vitals:   08/22/21 0745 08/22/21 0800 08/22/21 0900 08/22/21 1209  BP: (!) 191/107 (!) 199/68 (!) 177/77 (!) 152/89  Pulse: (!) 116 (!) 101 (!) 101 100   Resp:  17  18  Temp: 98.5 F (36.9 C)   98.1 F (36.7 C)  TempSrc: Oral   Axillary  SpO2: 100%  100% 100%  Weight:       Body mass index is 31.28 kg/m.  Physical Exam Constitutional:      General: He is not in acute distress.    Appearance: He is well-developed.  Cardiovascular:     Rate and Rhythm: Regular rhythm. Tachycardia present.     Heart sounds: Normal heart sounds.  Pulmonary:     Effort: Pulmonary effort is normal.     Breath sounds: Normal breath sounds.  Skin:    General: Skin is warm and dry.    Lab Results Lab Results  Component Value Date   WBC 16.8 (H) 08/22/2021   HGB 9.2 (L) 08/22/2021   HCT 28.6 (L) 08/22/2021   MCV 85.9 08/22/2021   PLT 362 08/22/2021    Lab Results  Component Value Date   CREATININE 3.20 (H) 08/22/2021   BUN 56 (H) 08/22/2021   NA 140 08/22/2021   K 4.2 08/22/2021   CL 104 08/22/2021   CO2 20 (L) 08/22/2021    Lab Results  Component Value Date   ALT 20 08/22/2021   AST 65 (H) 08/22/2021   ALKPHOS 181 (H) 08/22/2021   BILITOT 3.2 (H) 08/22/2021     Microbiology: Recent Results (from the past 240  hour(s))  Resp Panel by RT-PCR (Flu A&B, Covid) Nasopharyngeal Swab     Status: None   Collection Time: 08/18/21  7:36 PM   Specimen: Nasopharyngeal Swab; Nasopharyngeal(NP) swabs in vial transport medium  Result Value Ref Range Status   SARS Coronavirus 2 by RT PCR NEGATIVE NEGATIVE Final    Comment: (NOTE) SARS-CoV-2 target nucleic acids are NOT DETECTED.  The SARS-CoV-2 RNA is generally detectable in upper respiratory specimens during the acute phase of infection. The lowest concentration of SARS-CoV-2 viral copies this assay can detect is 138 copies/mL. A negative result does not preclude SARS-Cov-2 infection and should not be used as the sole basis for treatment or other patient management decisions. A negative result may occur with  improper specimen collection/handling, submission of specimen other than  nasopharyngeal swab, presence of viral mutation(s) within the areas targeted by this assay, and inadequate number of viral copies(<138 copies/mL). A negative result must be combined with clinical observations, patient history, and epidemiological information. The expected result is Negative.  Fact Sheet for Patients:  EntrepreneurPulse.com.au  Fact Sheet for Healthcare Providers:  IncredibleEmployment.be  This test is no t yet approved or cleared by the [place] FDA and  has been authorized for detection and/or diagnosis of SARS-CoV-2 by FDA under an Emergency Use Authorization (EUA). This EUA will remain  in effect (meaning this test can be used) for the duration of the COVID-19 declaration under Section 564(b)(1) of the Act, 21 U.S.C.section 360bbb-3(b)(1), unless the authorization is terminated  or revoked sooner.       Influenza A by PCR NEGATIVE NEGATIVE Final   Influenza B by PCR NEGATIVE NEGATIVE Final    Comment: (NOTE) The Xpert Xpress SARS-CoV-2/FLU/RSV plus assay is intended as an aid in the diagnosis of influenza from Nasopharyngeal swab specimens and should not be used as a sole basis for treatment. Nasal washings and aspirates are unacceptable for Xpert Xpress SARS-CoV-2/FLU/RSV testing.  Fact Sheet for Patients: EntrepreneurPulse.com.au  Fact Sheet for Healthcare Providers: IncredibleEmployment.be  This test is not yet approved or cleared by the [place] FDA and has been authorized for detection and/or diagnosis of SARS-CoV-2 by FDA under an Emergency Use Authorization (EUA). This EUA will remain in effect (meaning this test can be used) for the duration of the COVID-19 declaration under Section 564(b)(1) of the Act, 21 U.S.C. section 360bbb-3(b)(1), unless the authorization is terminated or revoked.  Performed at [facility], [place] [address], [place], [place] [address]    Urine Culture     Status: None   Collection Time: 08/18/21  7:36 PM   Specimen: In/Out Cath Urine  Result Value Ref Range Status   Specimen Description IN/OUT CATH URINE  Final   Special Requests NONE  Final   Culture   Final    NO GROWTH Performed at [facility], [place] [address], [place], [place] [address]    Report Status 08/20/2021 FINAL  Final  Blood Culture (routine x 2)     Status: None (Preliminary result)   Collection Time: 08/18/21  8:10 PM   Specimen: BLOOD  Result Value Ref Range Status   Specimen Description BLOOD LEFT ARM  Final   Special Requests   Final    BOTTLES DRAWN AEROBIC AND ANAEROBIC Blood Culture adequate volume   Culture   Final    NO GROWTH 4 DAYS Performed at [facility], [place] [address], [place], [place] [address]    Report Status PENDING  Incomplete  Blood Culture (routine x 2)     Status: None (Preliminary result)   Collection Time: 08/18/21  8:12 PM   Specimen: BLOOD RIGHT HAND  Result Value Ref Range Status   Specimen Description BLOOD RIGHT HAND  Final   Special Requests   Final    BOTTLES DRAWN AEROBIC AND ANAEROBIC Blood Culture adequate volume   Culture   Final    NO GROWTH 4 DAYS Performed at [facility], [place] [address] [place], [place] [address]    Report Status PENDING  Incomplete  Body fluid culture w Gram Stain     Status: None   Collection Time: 08/19/21  9:28 AM   Specimen: Body Fluid  Result Value Ref Range Status   Specimen Description FLUID  Final   Special Requests SYNOV KNEE  Final   Gram Stain   Final    NO ORGANISMS SEEN SQUAMOUS EPITHELIAL CELLS PRESENT MODERATE WBC PRESENT, PREDOMINANTLY PMN MODERATE GRAM POSITIVE COCCI Performed at [facility], [place] [address] [place], [place] [address]    Culture FEW STAPHYLOCOCCUS AUREUS  Final   Report Status 08/22/2021 FINAL  Final   Organism ID, Bacteria STAPHYLOCOCCUS AUREUS  Final      Susceptibility   Staphylococcus aureus - MIC*     CIPROFLOXACIN <=0.5 SENSITIVE Sensitive     ERYTHROMYCIN >=8 RESISTANT Resistant     GENTAMICIN <=0.5 SENSITIVE Sensitive     OXACILLIN 0.5 SENSITIVE Sensitive     TETRACYCLINE <=1 SENSITIVE Sensitive     VANCOMYCIN 1 SENSITIVE Sensitive     TRIMETH/SULFA <=10 SENSITIVE Sensitive     CLINDAMYCIN RESISTANT Resistant     RIFAMPIN <=0.5 SENSITIVE Sensitive     Inducible Clindamycin POSITIVE Resistant     * FEW STAPHYLOCOCCUS AUREUS  Aerobic/Anaerobic Culture w Gram Stain (surgical/deep wound)     Status: None (Preliminary result)   Collection Time: 08/19/21  7:22 PM   Specimen: Synovial, Left Knee; Body Fluid  Result Value Ref Range Status   Specimen Description KNEE LEFT SYNOVIAL FLUID  Final   Special Requests CUP ID A  Final   Gram Stain   Final    NO ORGANISMS SEEN SQUAMOUS EPITHELIAL CELLS PRESENT ABUNDANT WBC PRESENT,BOTH PMN AND MONONUCLEAR FEW GRAM POSITIVE COCCI Performed at [facility], [place] [address] [place], [place] [address]    Culture   Final    MODERATE STAPHYLOCOCCUS AUREUS NO ANAEROBES ISOLATED; CULTURE IN PROGRESS FOR 5 DAYS    Report Status PENDING  Incomplete   Organism ID, Bacteria STAPHYLOCOCCUS AUREUS  Final      Susceptibility   Staphylococcus aureus - MIC*    CIPROFLOXACIN <=0.5 SENSITIVE Sensitive     ERYTHROMYCIN >=8 RESISTANT Resistant     GENTAMICIN <=0.5 SENSITIVE Sensitive     OXACILLIN <=0.25 SENSITIVE Sensitive     TETRACYCLINE <=1 SENSITIVE Sensitive     VANCOMYCIN <=0.5 SENSITIVE Sensitive     TRIMETH/SULFA <=10 SENSITIVE Sensitive     CLINDAMYCIN RESISTANT Resistant     RIFAMPIN <=0.5 SENSITIVE Sensitive     Inducible Clindamycin POSITIVE Resistant     * MODERATE STAPHYLOCOCCUS AUREUS     Terri Piedra, NP [facility]  08/22/2021  12:34 PM

## 2021-08-23 DIAGNOSIS — M00062 Staphylococcal arthritis, left knee: Secondary | ICD-10-CM | POA: Diagnosis not present

## 2021-08-23 DIAGNOSIS — N179 Acute kidney failure, unspecified: Secondary | ICD-10-CM | POA: Diagnosis not present

## 2021-08-23 LAB — GLUCOSE, CAPILLARY
Glucose-Capillary: 248 mg/dL — ABNORMAL HIGH (ref 70–99)
Glucose-Capillary: 219 mg/dL — ABNORMAL HIGH (ref 70–99)
Glucose-Capillary: 299 mg/dL — ABNORMAL HIGH (ref 70–99)
Glucose-Capillary: 281 mg/dL — ABNORMAL HIGH (ref 70–99)
Glucose-Capillary: 320 mg/dL — ABNORMAL HIGH (ref 70–99)

## 2021-08-23 LAB — CBC
HCT: 28.1 % — ABNORMAL LOW (ref 39.0–52.0)
Hemoglobin: 9.2 g/dL — ABNORMAL LOW (ref 13.0–17.0)
MCH: 28 pg (ref 26.0–34.0)
MCHC: 32.7 g/dL (ref 30.0–36.0)
MCV: 85.4 fL (ref 80.0–100.0)
Platelets: 349 10*3/uL (ref 150–400)
RBC: 3.29 MIL/uL — ABNORMAL LOW (ref 4.22–5.81)
RDW: 17 % — ABNORMAL HIGH (ref 11.5–15.5)
WBC: 16.1 10*3/uL — ABNORMAL HIGH (ref 4.0–10.5)
nRBC: 0.1 % (ref 0.0–0.2)

## 2021-08-23 LAB — RENAL FUNCTION PANEL
Albumin: 2.1 g/dL — ABNORMAL LOW (ref 3.5–5.0)
Anion gap: 13 (ref 5–15)
BUN: 70 mg/dL — ABNORMAL HIGH (ref 8–23)
CO2: 22 mmol/L (ref 22–32)
Calcium: 8.8 mg/dL — ABNORMAL LOW (ref 8.9–10.3)
Chloride: 105 mmol/L (ref 98–111)
Creatinine, Ser: 3.57 mg/dL — ABNORMAL HIGH (ref 0.61–1.24)
GFR, Estimated: 17 mL/min — ABNORMAL LOW (ref >60)
Glucose, Bld: 244 mg/dL — ABNORMAL HIGH (ref 70–99)
Phosphorus: 4.4 mg/dL (ref 2.5–4.6)
Potassium: 3.7 mmol/L (ref 3.5–5.1)
Sodium: 140 mmol/L (ref 135–145)

## 2021-08-23 LAB — HEPATIC FUNCTION PANEL
ALT: 14 U/L (ref 0–44)
AST: 60 U/L — ABNORMAL HIGH (ref 15–41)
Albumin: 2.1 g/dL — ABNORMAL LOW (ref 3.5–5.0)
Alkaline Phosphatase: 223 U/L — ABNORMAL HIGH (ref 38–126)
Bilirubin, Direct: 0.8 mg/dL — ABNORMAL HIGH (ref 0.0–0.2)
Indirect Bilirubin: 0.9 mg/dL (ref 0.3–0.9)
Total Bilirubin: 1.7 mg/dL — ABNORMAL HIGH (ref 0.3–1.2)
Total Protein: 7.5 g/dL (ref 6.5–8.1)

## 2021-08-23 LAB — CULTURE, BLOOD (ROUTINE X 2)
Culture: NO GROWTH
Culture: NO GROWTH
Special Requests: ADEQUATE
Special Requests: ADEQUATE

## 2021-08-23 LAB — PROTIME-INR
INR: 1.2 (ref 0.8–1.2)
Prothrombin Time: 15.3 seconds — ABNORMAL HIGH (ref 11.4–15.2)

## 2021-08-23 MED ORDER — INSULIN ASPART 100 UNIT/ML IJ SOLN
4.0000 [IU] | Freq: Once | INTRAMUSCULAR | Status: AC
  Administered 2021-08-23: 4 [IU] via SUBCUTANEOUS

## 2021-08-23 MED ORDER — CHLORHEXIDINE GLUCONATE CLOTH 2 % EX PADS
6.0000 | MEDICATED_PAD | Freq: Every day | CUTANEOUS | Status: DC
  Administered 2021-08-23 – 2021-09-11 (×19): 6 via TOPICAL

## 2021-08-23 MED ORDER — ACETAMINOPHEN 325 MG PO TABS
650.0000 mg | ORAL_TABLET | Freq: Four times a day (QID) | ORAL | Status: DC | PRN
  Administered 2021-08-23 – 2021-09-08 (×12): 650 mg via ORAL
  Filled 2021-08-23 (×13): qty 2

## 2021-08-23 MED ORDER — INSULIN ASPART 100 UNIT/ML IJ SOLN
0.0000 [IU] | Freq: Three times a day (TID) | INTRAMUSCULAR | Status: DC
Start: 2021-08-23 — End: 2021-08-25
  Administered 2021-08-23: 3 [IU] via SUBCUTANEOUS
  Administered 2021-08-24: 4 [IU] via SUBCUTANEOUS
  Administered 2021-08-24: 3 [IU] via SUBCUTANEOUS
  Administered 2021-08-24: 2 [IU] via SUBCUTANEOUS
  Administered 2021-08-25: 4 [IU] via SUBCUTANEOUS

## 2021-08-23 MED ORDER — SENNA 8.6 MG PO TABS
1.0000 | ORAL_TABLET | Freq: Every day | ORAL | Status: DC | PRN
  Administered 2021-09-04: 8.6 mg via ORAL
  Filled 2021-08-23: qty 1

## 2021-08-23 MED ORDER — AMLODIPINE BESYLATE 10 MG PO TABS
10.0000 mg | ORAL_TABLET | Freq: Every day | ORAL | Status: DC
  Administered 2021-08-24: 10 mg via ORAL
  Filled 2021-08-23: qty 1

## 2021-08-23 MED ORDER — ASPIRIN 81 MG PO CHEW
81.0000 mg | CHEWABLE_TABLET | Freq: Every day | ORAL | Status: DC
  Administered 2021-08-24: 81 mg via ORAL
  Filled 2021-08-23: qty 1

## 2021-08-23 MED ORDER — SENNOSIDES 8.8 MG/5ML PO SYRP
8.8000 mg | ORAL_SOLUTION | Freq: Every day | ORAL | Status: DC | PRN
  Filled 2021-08-23: qty 5

## 2021-08-23 MED ORDER — AMLODIPINE BESYLATE 5 MG PO TABS: 5.0000 mg | ORAL_TABLET | Freq: Every day | ORAL | Status: DC

## 2021-08-23 NOTE — Progress Notes (Addendum)
HD#5 SUBJECTIVE:  Patient Summary: Leslie [name] is a 75 y.o. with a pertinent PMH of HTN, HLD, T2DM, COPD, bilateral BKAs, alcohol use disorder, PAD, and a remote history of hepatitis, who presented with altered mental status and admitted for acute encephalopathy complicated by Acute Kidney Injury on 10/23, on hospital day 6. Patient has significant tenderness to left knee joint and arthrocentesis showed frank pus, patient was taken to OR for I and D 10/24.  Overnight Events:  Bladder scan showed >999 mL of urine in bladder.  Foley catheter inserted with 1,300 mL output.  Interim History:   Patient assessed at bedside this AM. Resting in bed comfortably. Able to state his name. Unable to endorse pain.   OBJECTIVE:  Vital Signs: Vitals:   08/22/21 1600 08/22/21 1945 08/23/21 0017 08/23/21 0424  BP: (!) 178/93 (!) 166/97 (!) 147/67 124/80  Pulse:  94 92 92  Resp:  _0 Temp: 97.8 F (36.6 C) 97.7 F (36.5 C) 98.2 F (36.8 C) 98.1 F (36.7 C)  TempSrc: Oral Axillary Oral Oral  SpO2: 99% 97% 100% 100%  Weight:       Supplemental O2: Nasal Cannula SpO2: 100 % O2 Flow Rate (L/min): 2 L/min  Filed Weights   08/20/21 0345 08/21/21 0316 08/22/21 0331  Weight: 91.8 kg 90.1 kg 87.9 kg     Intake/Output Summary (Last 24 hours) at 08/23/2021 8144 Last data filed at 08/23/2021 0255 Gross per 24 hour  Intake 400 ml  Output 1900 ml  Net -1500 ml    Net IO Since Admission: -2,823 mL [08/23/21 0552]  Physical Exam: General: chronically ill appearing elderly man HENT: thickened skin of nose with bulbous appearance, NCAT Eyes: no scleral icterus, conjunctiva clear CV: normal rate, no murmurs Pulm: crackles present diffusely, normal work of breathing, on Nasal cannula at 2.5 L GI:no tenderness present, soft, non-distended MSK: bilateral BKAs, erythema, tenderness, and edema present at left stump extending into left knee Skin: warm and dry Neuro: alert and oriented to  self only, hard of hearing   Patient Lines/Drains/Airways Status     Active Line/Drains/Airways     Name Placement date Placement time Site Days   Peripheral IV 08/18/21 20 G Left;Posterior Wrist 08/18/21  1724  Wrist  1   Negative Pressure Wound Therapy Knee Anterior;Right 03/28/18  0845  --  1240   External Urinary Catheter 08/18/21  1854  --  1   Incision (Closed) 03/28/18 Leg Right 03/28/18  0839  -- 1240            Pertinent Labs: CBC Latest Ref Rng & Units 08/23/2021 08/22/2021 08/21/2021  WBC 4.0 - 10.5 K/uL 16.1(H) 16.8(H) 16.5(H)  Hemoglobin 13.0 - 17.0 g/dL 9.2(L) 9.2(L) 8.9(L)  Hematocrit 39.0 - 52.0 % 28.1(L) 28.6(L) 28.0(L)  Platelets 150 - 400 K/uL 349 362 343    CMP Latest Ref Rng & Units 08/23/2021 08/22/2021 08/21/2021  Glucose 70 - 99 mg/dL 244(H) 164(H) 170(H)  BUN 8 - 23 mg/dL 70(H) 56(H) 57(H)  Creatinine 0.61 - 1.24 mg/dL 3.57(H) 3.20(H) 3.20(H)  Sodium 135 - 145 mmol/L 140 140 140  Potassium 3.5 - 5.1 mmol/L 3.7 4.2 4.5  Chloride 98 - 111 mmol/L 105 104 103  CO2 22 - 32 mmol/L 22 20(L) 22  Calcium 8.9 - 10.3 mg/dL 8.8(L) 9.0 8.9  Total Protein 6.5 - 8.1 g/dL 7.5 7.5 -  Total Bilirubin 0.3 - 1.2 mg/dL 1.7(H) 3.2(H) -  Alkaline Phos 38 -  126 U/L 223(H) 181(H) -  AST 15 - 41 U/L 60(H) 65(H) -  ALT 0 - 44 U/L 14 20 -    Recent Labs    08/22/21 0627 08/22/21 1246 08/22/21 1627  GLUCAP 171* 184* 180*      Pertinent Imaging: CT ABDOMEN PELVIS WO CONTRAST  Result Date: 08/23/2021 CLINICAL DATA:  Abdominal pain. EXAM: CT ABDOMEN AND PELVIS WITHOUT CONTRAST TECHNIQUE: Multidetector CT imaging of the abdomen and pelvis was performed following the standard protocol without IV contrast. COMPARISON:  Abdominal radiograph dated 08/22/2021. FINDINGS: Evaluation of this exam is limited in the absence of intravenous contrast. Lower chest: The visualized lung bases are clear. Several pleural based calcifications noted which may be sequela of prior asbestos  exposure. Three vessel coronary vascular calcification. A 12 mm nodular density at the left lung base (60/5), possibly with focus of scarring. This is however new since the study of 01/03/2019. Follow-up recommended. No intra-abdominal free air or free fluid. Hepatobiliary: Small calcified liver granuloma. The liver is otherwise unremarkable. No intrahepatic biliary dilatation. The gallbladder is unremarkable. Pancreas: Unremarkable. No pancreatic ductal dilatation or surrounding inflammatory changes. Spleen: Normal in size without focal abnormality. Adrenals/Urinary Tract: The adrenal glands are unremarkable. There is no hydronephrosis or nephrolithiasis on either side. Renal vascular calcifications. Mild fullness of the renal collecting systems and ureters may be related to reflux. The urinary bladder is distended. Stomach/Bowel: Feeding tube with tip in the region of the proximal duodenum. There is no bowel obstruction or active inflammation. Several small scattered colonic diverticula without active inflammatory changes. The appendix is normal. Vascular/Lymphatic: Advanced aortoiliac atherosclerotic disease. The IVC is unremarkable. No portal venous gas. There is no adenopathy. Reproductive: Enlarged prostate gland measuring 6.6 cm in transverse axial diameter. The seminal vesicles are symmetric. Other: None Musculoskeletal: Degenerative changes of the spine. No acute osseous pathology. IMPRESSION: 1. No acute intra-abdominal or pelvic pathology. No bowel obstruction. Normal appendix. 2. Colonic diverticulosis. 3. Enlarged prostate gland. 4. Distended urinary bladder with probable mild reflux. 5. Aortic Atherosclerosis (ICD10-I70.0). Electronically Signed   By: Anner Crete M.D.   On: 08/23/2021 00:19   DG Abd 1 View  Result Date: 08/22/2021 CLINICAL DATA:  Feeding tube adjustment EXAM: ABDOMEN - 1 VIEW COMPARISON:  Abdominal x-ray 08/21/2021 FINDINGS: Enteric tube tip is in the proximal duodenum with  injected contrast visualized in the duodenum. IMPRESSION: As above. Electronically Signed   By: Ofilia Neas M.D.   On: 08/22/2021 08:57    ASSESSMENT/PLAN:  Assessment: Principal Problem:   Septic arthritis of knee (HCC) Active Problems:   Acute renal failure (HCC)   Altered mental status   S/P debridement   MSSA (methicillin susceptible Staphylococcus aureus) infection  Leslie [name] is a 75 y.o. with a pertinent PMH of HTN, HLD, T2DM, COPD, bilateral BKAs, alcohol use disorder, PAD, and a remote history of hepatitis, who presented with altered mental status and admitted for acute encephalopathy complicated by Acute Kidney Injury on 10/23, on hospital day 6. Patient has significant tenderness to left knee joint and arthrocentesis showed frank pus, patient was taken to OR for I and D 10/24.  Plan: Acute encephalopathy Mental status slowly improving.  Today, patient appears more alert and oriented to self.  He states that he has pain everywhere. Speech evaluated today and patient was able to swallow graham crackers, applesauce, nectar, applesauce, water and ice chips.  Will advance diet and remove nasogastric tube.   -holding sedating medications -advance diet as tolerated  to carb modified -remove nasogastric tube -PT/ OT   MSSA septic arthritis of left knee s/p I&D Leukocytosis Cellulitis of left stump Open Arthrotomy completed 10/24 with large purulent effusion that grew MSSA on cefazolin day 3.  CT lower extremity showed mild skin thickening and subcutaneous soft tissue edema about the lateral aspect of the thigh as well as about the amputation site. On exam his left stump remains erythematous, tender, and edematous.  -IV cefazolin 2g q 12 hours, day 3  -Appreciate assistance from orthopedic surgery  -Appreciate Infectious Disease recommendations -Received 3 days of vancomycin and cefepime, discontinued 10/26 -Blood and urine cultures pending, no growth after 5 days -trend  CBC -Tylenol 650 mg, BID for pain -TOC consult for SNF placement  Acute Kidney Injury Acidosis Oliguria-resolved Urinary retention Creatinine and BUN with mild increase his overnight, Creatinine 3.2->3.57 and BUN 56->70. CT abd showed distended urinary bladder with probable mild reflux, no acute intra-abdominal or pelvic pathology, no obstruction, normal appendix.  Bladder scan performed showed greater than _0 mL of urine in bladder.  Foley catheter was placed with -1300 mL output.  Likely creatinine increases secondary to post obstructive etiology.  Will follow-up on BMP tomorrow.  -Nephrology following, appreciate their recommendations -Continue to monitor renal function, trending BMP today, monitoring for post-ATN diuresis -Monitor Urine output -Discontinued LR maintenance fluids, FWF per RD -Continue to monitor renal function panel  Type 2 diabetes with peripheral neuropathy Hypoglycemia, improving Initially presented with low BG, home insulin held. A1c of 5.8 on admisison. Glucose trending up into the 200s today. Confirmed carbamazepine prescribed for neuropathy, not seizures, per [facility] provider.  Patient was evaluated by speech today and started on a diet, added sliding scale insulin.   -CBGs q 4 hours -Sliding scale insulin very sensitive -holding carbamazepime due to sedating effect  Alcohol use disorder Transaminitis Unsure of last drink, patient has multiple reasons for altered mental status.  Alk phos 181, AST 65, Albumin 2.2, Total Bilirubin 3.2- indirect 2 and direct 1.2. Repeat INR at 1.2.  -discontinued [place] -Holding sedating medications, including home medications of lyrica and carbamazepine. -thiamine 073 mg -Folic acid 1 mg  Hypertension Home medications include Amlodipine 10 mg, Hydralazine 50 mg TID, and Metoprolol 24 hr 200 mg.  Blood pressure was 112/57 on initial presentation to ED. Patient's blood pressure has trended up into the 150s to 170s.  Amlodipine  5 mg added, but patient's blood pressure has remained elevated up to 170s.     -Increased Amlodipine 5 mg to 10 mg -Holding Hydralazine, Metoprolol, Lasix  EKG changes Prolonged Qtc HFpEF On exam, patient does not appear volume overloaded. Echo showed left ventricular EF of 60 to 65% with no wall motion abnormalities with mild concentric left ventricular hypertrophy.   -Telemetry monitoring -holding home medication of Lasix 20 mg until creatinine improves  Hypovolemic Hyponatremia-resolved Sodium at 137 today.    -BMP trend  Normocytic anemia Hemoglobin remains stable at 9.2.  This appears to be around his baseline. Ganzoni equation calculation of 1019 mg iron deficiency. Patient currently has infective process of left knee joint, will consider iron supplementation once infection resolves.  -Monitor CBC  Hyperlipidemia Lipid panel showed triglycerides of 208 and LDL not able to be calculated.   COPD Albuterol inhaler at home.  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  -On aspirin 81 mg and pentoxifylline 400 mg 3 times daily at home, will restart aspirin to be given  through feeding tube.  Best Practice: Diet: NPO due to altered mental status IVF: Fluids: LR, Rate:  150 cc/hr x 20 hrs VTE: heparin injection 5,000 Units Start: 08/19/21 1400 Code: Full AB: cefazolin, day 2    DISPO: Anticipated discharge pending Medical stability.  Signature: Christiana Fuchs, D.O. Internal Medicine Resident, PGY-1 [facility]  Pager: [phone] 5:52 AM, 08/23/2021   Please contact the on call pager after 5 pm and on weekends at [phone].

## 2021-08-23 NOTE — Progress Notes (Signed)
The patient went down for an abdominal CT which showed a distended bladder.  The patient was putting out very little output, which was tea colored and had sediments.  The physician ordered a bladder scan and this scan said the patient had more than 999 mL of urine in the bladder.  The physician then ordered to place a foley catheter.  The nurse and nurse tech put in a 14 French foley catheter. The output of dark urine was 1,300 mL.  Will continue to monitor.  Lupita Dawn, RN

## 2021-08-23 NOTE — Progress Notes (Signed)
Pt cortrak removed per MD order.  Pt tolerated well.

## 2021-08-23 NOTE — Progress Notes (Signed)
[facility]  Date of Admission:  08/18/2021      Lines: Foley catheter  Abx: 10/26-c cefazolin  10/23-26 vanc  ASSESSMENT: MSSA Left septic knee Metabolic encephalopathy Severe sepsis with aki Hx bilateral bka Etoh abuse Encephalopathy improving although not baseline Aki initially improved now plateauing in the 3's. Last known baseline 2019 cr = 0.9-1  Admission 10/23 bcx negative S/p I&D/washout left knee 10/24; cx mssa 10/25 tte no valve vegetation but mentioned severe mitral valve annular calcification   ------ 10/28 assessment Continued improvement Tenderness around left knee joint still moderate planned 6 week cefazolin from 10/24 Aki not baseline  PLAN: Continue cefazolin Monitor left knee; if worsening swelling/pain might need repeat ortho evaluation Q72 hours cbc, crp, cmp Will continue to follow  I spent more than 35 minute reviewing data/chart, and coordinating care and >50% direct face to face time providing counseling/discussing diagnostics/treatment plan with patient   Principal Problem:   Septic arthritis of knee ([place]) Active Problems:   Acute renal failure (HCC)   Altered mental status   S/P debridement   MSSA (methicillin susceptible Staphylococcus aureus) infection   Allergies  Allergen Reactions   Atorvastatin Other (See Comments)    Unknown reaction   Statins Other (See Comments)    Unknown reaction - on MAR   Flexeril [Cyclobenzaprine] Other (See Comments)    Dry mouth and hallucinations    Scheduled Meds:  acetaminophen  650 mg Per NG tube BID   amLODipine  5 mg Per NG tube Daily   aspirin  81 mg Per NG tube Daily   Chlorhexidine Gluconate Cloth  6 each Topical Daily   feeding supplement (PROSource TF)  45 mL Per Tube BID   free water  150 mL Per Tube Q4H   heparin  5,000 Units Subcutaneous Q8H   hydrocerin   Topical BID   thiamine  100 mg Intravenous Daily   Continuous Infusions:    ceFAZolin (ANCEF) IV 2 g (08/23/21 0917)   feeding supplement (JEVITY 1.2 CAL) 40 mL/hr at 08/23/21 0430   PRN Meds:.[DISCONTINUED] acetaminophen **OR** acetaminophen, albuterol, bisacodyl, sennosides, sodium phosphate   SUBJECTIVE: Spoke with patient and his niece on the phone Per niece patient better but not baseline in terms of mentation Patient reports no focal pain outside of left knee Said no diarrhea No rash No dyspnea/sob/cough    Review of Systems: ROS All other ROS was negative, except mentioned above     OBJECTIVE: Vitals:   08/23/21 0017 08/23/21 0424 08/23/21 0821 08/23/21 1159  BP: (!) 147/67 124/80 (!) 154/73 (!) 171/75  Pulse: 92 92 100 93  Resp: 13 15 20 18   Temp: 98.2 F (36.8 C) 98.1 F (36.7 C) 99.1 F (37.3 C) 98.6 F (37 C)  TempSrc: Oral Oral Oral Oral  SpO2: 100% 100% 97% 97%  Weight:       Body mass index is 31.28 kg/m.  Physical Exam General/constitutional: no distress, pleasant HEENT: Normocephalic, PER, Conj Clear; thick beard Neck supple CV: rrr no mrg Lungs: clear to auscultation, normal respiratory effort Abd: Soft, Nontender Ext: no edema Skin: No Rash Neuro: nonfocal outside of intention tremor; no rigidity MSK: s/p bilateral bka; left knee incision dressing clean/dry; there is surrounding warmth/erythema around left knee and moderately tender to palpation   Lab Results Lab Results  Component Value Date   WBC 16.1 (H) 08/23/2021   HGB 9.2 (L) 08/23/2021   HCT 28.1 (  L) 08/23/2021   MCV 85.4 08/23/2021   PLT 349 08/23/2021    Lab Results  Component Value Date   CREATININE 3.57 (H) 08/23/2021   BUN 70 (H) 08/23/2021   NA 140 08/23/2021   K 3.7 08/23/2021   CL 105 08/23/2021   CO2 22 08/23/2021    Lab Results  Component Value Date   ALT 14 08/23/2021   AST 60 (H) 08/23/2021   ALKPHOS 223 (H) 08/23/2021   BILITOT 1.7 (H) 08/23/2021      Microbiology: Recent Results (from the past 240 hour(s))  Resp Panel by  RT-PCR (Flu A&B, Covid) Nasopharyngeal Swab     Status: None   Collection Time: 08/18/21  7:36 PM   Specimen: Nasopharyngeal Swab; Nasopharyngeal(NP) swabs in vial transport medium  Result Value Ref Range Status   SARS Coronavirus 2 by RT PCR NEGATIVE NEGATIVE Final    Comment: (NOTE) SARS-CoV-2 target nucleic acids are NOT DETECTED.  The SARS-CoV-2 RNA is generally detectable in upper respiratory specimens during the acute phase of infection. The lowest concentration of SARS-CoV-2 viral copies this assay can detect is 138 copies/mL. A negative result does not preclude SARS-Cov-2 infection and should not be used as the sole basis for treatment or other patient management decisions. A negative result may occur with  improper specimen collection/handling, submission of specimen other than nasopharyngeal swab, presence of viral mutation(s) within the areas targeted by this assay, and inadequate number of viral copies(<138 copies/mL). A negative result must be combined with clinical observations, patient history, and epidemiological information. The expected result is Negative.  Fact Sheet for Patients:  EntrepreneurPulse.com.au  Fact Sheet for Healthcare Providers:  IncredibleEmployment.be  This test is no t yet approved or cleared by the [place] FDA and  has been authorized for detection and/or diagnosis of SARS-CoV-2 by FDA under an Emergency Use Authorization (EUA). This EUA will remain  in effect (meaning this test can be used) for the duration of the COVID-19 declaration under Section 564(b)(1) of the Act, 21 U.S.C.section 360bbb-3(b)(1), unless the authorization is terminated  or revoked sooner.       Influenza A by PCR NEGATIVE NEGATIVE Final   Influenza B by PCR NEGATIVE NEGATIVE Final    Comment: (NOTE) The Xpert Xpress SARS-CoV-2/FLU/RSV plus assay is intended as an aid in the diagnosis of influenza from Nasopharyngeal swab  specimens and should not be used as a sole basis for treatment. Nasal washings and aspirates are unacceptable for Xpert Xpress SARS-CoV-2/FLU/RSV testing.  Fact Sheet for Patients: EntrepreneurPulse.com.au  Fact Sheet for Healthcare Providers: IncredibleEmployment.be  This test is not yet approved or cleared by the [place] FDA and has been authorized for detection and/or diagnosis of SARS-CoV-2 by FDA under an Emergency Use Authorization (EUA). This EUA will remain in effect (meaning this test can be used) for the duration of the COVID-19 declaration under Section 564(b)(1) of the Act, 21 U.S.C. section 360bbb-3(b)(1), unless the authorization is terminated or revoked.  Performed at [facility], [place] [address], [place], [place] [address]   Urine Culture     Status: None   Collection Time: 08/18/21  7:36 PM   Specimen: In/Out Cath Urine  Result Value Ref Range Status   Specimen Description IN/OUT CATH URINE  Final   Special Requests NONE  Final   Culture   Final    NO GROWTH Performed at [facility], [place] [address] [place], [place] [address]    Report Status 08/20/2021  FINAL  Final  Blood Culture (routine x 2)     Status: None   Collection Time: 08/18/21  8:10 PM   Specimen: BLOOD  Result Value Ref Range Status   Specimen Description BLOOD LEFT ARM  Final   Special Requests   Final    BOTTLES DRAWN AEROBIC AND ANAEROBIC Blood Culture adequate volume   Culture   Final    NO GROWTH 5 DAYS Performed at [facility], [address], [place], [place] [address]    Report Status 08/23/2021 FINAL  Final  Blood Culture (routine x 2)     Status: None   Collection Time: 08/18/21  8:12 PM   Specimen: BLOOD RIGHT HAND  Result Value Ref Range Status   Specimen Description BLOOD RIGHT HAND  Final   Special Requests   Final    BOTTLES DRAWN AEROBIC AND ANAEROBIC Blood Culture adequate volume   Culture   Final    NO  GROWTH 5 DAYS Performed at [facility], [place] [address] [place], [place] [address]    Report Status 08/23/2021 FINAL  Final  Body fluid culture w Gram Stain     Status: None   Collection Time: 08/19/21  9:28 AM   Specimen: Body Fluid  Result Value Ref Range Status   Specimen Description FLUID  Final   Special Requests SYNOV KNEE  Final   Gram Stain   Final    NO ORGANISMS SEEN SQUAMOUS EPITHELIAL CELLS PRESENT MODERATE WBC PRESENT, PREDOMINANTLY PMN MODERATE GRAM POSITIVE COCCI Performed at [facility], [place] [address] [place], [place] [address]    Culture FEW STAPHYLOCOCCUS AUREUS  Final   Report Status 08/22/2021 FINAL  Final   Organism ID, Bacteria STAPHYLOCOCCUS AUREUS  Final      Susceptibility   Staphylococcus aureus - MIC*    CIPROFLOXACIN <=0.5 SENSITIVE Sensitive     ERYTHROMYCIN >=8 RESISTANT Resistant     GENTAMICIN <=0.5 SENSITIVE Sensitive     OXACILLIN 0.5 SENSITIVE Sensitive     TETRACYCLINE <=1 SENSITIVE Sensitive     VANCOMYCIN 1 SENSITIVE Sensitive     TRIMETH/SULFA <=10 SENSITIVE Sensitive     CLINDAMYCIN RESISTANT Resistant     RIFAMPIN <=0.5 SENSITIVE Sensitive     Inducible Clindamycin POSITIVE Resistant     * FEW STAPHYLOCOCCUS AUREUS  Aerobic/Anaerobic Culture w Gram Stain (surgical/deep wound)     Status: None (Preliminary result)   Collection Time: 08/19/21  7:22 PM   Specimen: Synovial, Left Knee; Body Fluid  Result Value Ref Range Status   Specimen Description KNEE LEFT SYNOVIAL FLUID  Final   Special Requests CUP ID A  Final   Gram Stain   Final    NO ORGANISMS SEEN SQUAMOUS EPITHELIAL CELLS PRESENT ABUNDANT WBC PRESENT,BOTH PMN AND MONONUCLEAR FEW GRAM POSITIVE COCCI Performed at [facility], [place] [address], [place], [place] [address]    Culture   Final    MODERATE STAPHYLOCOCCUS AUREUS NO ANAEROBES ISOLATED; CULTURE IN PROGRESS FOR 5 DAYS    Report Status PENDING  Incomplete   Organism ID, Bacteria STAPHYLOCOCCUS  AUREUS  Final      Susceptibility   Staphylococcus aureus - MIC*    CIPROFLOXACIN <=0.5 SENSITIVE Sensitive     ERYTHROMYCIN >=8 RESISTANT Resistant     GENTAMICIN <=0.5 SENSITIVE Sensitive     OXACILLIN <=0.25 SENSITIVE Sensitive     TETRACYCLINE <=1 SENSITIVE Sensitive     VANCOMYCIN <=0.5 SENSITIVE Sensitive  TRIMETH/SULFA <=10 SENSITIVE Sensitive     CLINDAMYCIN RESISTANT Resistant     RIFAMPIN <=0.5 SENSITIVE Sensitive     Inducible Clindamycin POSITIVE Resistant     * MODERATE STAPHYLOCOCCUS AUREUS     Serology:   Imaging: If present, new imagings (plain films, ct scans, and mri) have been personally visualized and interpreted; radiology reports have been reviewed. Decision making incorporated into the Impression / Recommendations.  10/28 ct abd pelv 1. No acute intra-abdominal or pelvic pathology. No bowel obstruction. Normal appendix. 2. Colonic diverticulosis. 3. Enlarged prostate gland. 4. Distended urinary bladder with probable mild reflux. 5. Aortic Atherosclerosis  10/25 tte  1. Left ventricular ejection fraction, by estimation, is 60 to 65%. The  left ventricle has normal function. The left ventricle has no regional  wall motion abnormalities. There is mild concentric left ventricular  hypertrophy. Left ventricular diastolic  function could not be evaluated.   2. Right ventricular systolic function is normal. The right ventricular  size is normal. Tricuspid regurgitation signal is inadequate for assessing  PA pressure.   3. The mitral valve is degenerative. Trivial mitral valve regurgitation.  Mild mitral stenosis. Moderate to severe mitral annular calcification.   4. The aortic valve is grossly normal. Aortic valve regurgitation is not  visualized. No aortic stenosis is present.   5. The inferior vena cava is normal in size with greater than 50%  respiratory variability, suggesting right atrial pressure of 3 mmHg.   Jabier Mutton, [facility] [place] [phone] pager    08/23/2021, 12:37 PM

## 2021-08-23 NOTE — Progress Notes (Signed)
Subjective:  CT a/p overnight - distended bladder; foley placed at 1451mL urine drained.  BUN 70/3.6 from 56/3.2 but foley didn't go in until overnight. He offers little this AM but denies pain currently.  Was unaware of difficulty voiding.   Objective Vital signs in last 24 hours: Vitals:   08/22/21 1945 08/23/21 0017 08/23/21 0424 08/23/21 0821  BP: (!) 166/97 (!) 147/67 124/80 (!) 154/73  Pulse: 94 92 92 100  Resp: 18 13 15 20   Temp: 97.7 F (36.5 C) 98.2 F (36.8 C) 98.1 F (36.7 C) 99.1 F (37.3 C)  TempSrc: Axillary Oral Oral Oral  SpO2: 97% 100% 100% 97%  Weight:       Weight change:   Intake/Output Summary (Last 24 hours) at 08/23/2021 2119 Last data filed at 08/23/2021 0255 Gross per 24 hour  Intake 400 ml  Output 1900 ml  Net -1500 ml     Assessment/Plan: 75 year old WM with altered MS and also with AKI 1.Renal- if crt was 1.5 in June (reported in the chart) now with severe AKI with Cr initially ~6.  UA ok; concern for ATN related to possible hypotension. U/S did not initially show obstruction but now with urinary retention requiring foley placement 10/28.  I think the slight increase in Cr yesterday to today is related to that.  Will follow now after foley has been placed.   2. Hypertension/volume  - Initially volume expanded and now on TF and looks like can  take some PO.Marland Kitchen  Looks pretty euvolemic. .  Amlodipine started and titrated up per primary team - BP now fairly normal.  3. Metabolic acidosis-  bicarb 22 now 4. ID-  with elevated WBC-   found to have septic knee-  abx simplified to ancef -  WBC improved but now hovering about 16k.  He's been afebrile but this AM temp ws 99.1 5. Anemia  - hgb around 9 without trend-  follow 6. Dec MS- cannot explain-  BUN at his level would not do-  I would think sedating meds would be out of system by now-  but actually got them Monday night  ? HCT negative for CVA on admit- previously lived alone ?  Is improving but only VERY  SLOWLY-  May need to get palliative on the case if cont to not be able to respond or make decisions.   Leslie [name]    Labs: Basic Metabolic Panel: Recent Labs  Lab 08/21/21 0217 08/21/21 1644 08/22/21 0136 08/23/21 0131  NA 137 140 140 140  K 4.6 4.5 4.2 3.7  CL 102 103 104 105  CO2 20* 22 20* 22  GLUCOSE 123* 170* 164* 244*  BUN 67* 57* 56* 70*  CREATININE 3.83* 3.20* 3.20* 3.57*  CALCIUM 8.6* 8.9 9.0 8.8*  PHOS 5.4*  --  4.5 4.4    Liver Function Tests: Recent Labs  Lab 08/19/21 0529 08/21/21 0217 08/22/21 0136 08/23/21 0131  AST 43*  --  65* 60*  ALT 14  --  20 14  ALKPHOS 192*  --  181* 223*  BILITOT 2.3*  --  3.2* 1.7*  PROT 7.1  --  7.5 7.5  ALBUMIN 2.2* 1.9* 2.2*  2.2* 2.1*  2.1*    Recent Labs  Lab 08/22/21 0136  LIPASE 73*   No results for input(s): AMMONIA in the last 168 hours. CBC: Recent Labs  Lab 08/18/21 1736 08/18/21 2323 08/19/21 0529 08/20/21 0147 08/21/21 0217 08/22/21 0136 08/23/21 0131  WBC  27.1*  --  29.4* 18.0* 16.5* 16.8* 16.1*  NEUTROABS 22.3*  --   --   --   --   --   --   HGB 9.2*   < > 9.6* 8.9* 8.9* 9.2* 9.2*  HCT 28.2*   < > 29.7* 27.4* 28.0* 28.6* 28.1*  MCV 84.7  --  85.8 86.4 86.2 85.9 85.4  PLT 403*  --  453* 381 343 362 349   < > = values in this interval not displayed.    Cardiac Enzymes: Recent Labs  Lab 08/18/21 1736  CKTOTAL 221    CBG: Recent Labs  Lab 08/21/21 2107 08/22/21 0627 08/22/21 1246 08/22/21 1627 08/23/21 0648  GLUCAP 151* 171* 184* 180* 248*     Iron Studies:  No results for input(s): IRON, TIBC, TRANSFERRIN, FERRITIN in the last 72 hours.  Studies/Results: CT ABDOMEN PELVIS WO CONTRAST  Result Date: 08/23/2021 CLINICAL DATA:  Abdominal pain. EXAM: CT ABDOMEN AND PELVIS WITHOUT CONTRAST TECHNIQUE: Multidetector CT imaging of the abdomen and pelvis was performed following the standard protocol without IV contrast. COMPARISON:  Abdominal radiograph dated 08/22/2021.  FINDINGS: Evaluation of this exam is limited in the absence of intravenous contrast. Lower chest: The visualized lung bases are clear. Several pleural based calcifications noted which may be sequela of prior asbestos exposure. Three vessel coronary vascular calcification. A 12 mm nodular density at the left lung base (60/5), possibly with focus of scarring. This is however new since the study of 01/03/2019. Follow-up recommended. No intra-abdominal free air or free fluid. Hepatobiliary: Small calcified liver granuloma. The liver is otherwise unremarkable. No intrahepatic biliary dilatation. The gallbladder is unremarkable. Pancreas: Unremarkable. No pancreatic ductal dilatation or surrounding inflammatory changes. Spleen: Normal in size without focal abnormality. Adrenals/Urinary Tract: The adrenal glands are unremarkable. There is no hydronephrosis or nephrolithiasis on either side. Renal vascular calcifications. Mild fullness of the renal collecting systems and ureters may be related to reflux. The urinary bladder is distended. Stomach/Bowel: Feeding tube with tip in the region of the proximal duodenum. There is no bowel obstruction or active inflammation. Several small scattered colonic diverticula without active inflammatory changes. The appendix is normal. Vascular/Lymphatic: Advanced aortoiliac atherosclerotic disease. The IVC is unremarkable. No portal venous gas. There is no adenopathy. Reproductive: Enlarged prostate gland measuring 6.6 cm in transverse axial diameter. The seminal vesicles are symmetric. Other: None Musculoskeletal: Degenerative changes of the spine. No acute osseous pathology. IMPRESSION: 1. No acute intra-abdominal or pelvic pathology. No bowel obstruction. Normal appendix. 2. Colonic diverticulosis. 3. Enlarged prostate gland. 4. Distended urinary bladder with probable mild reflux. 5. Aortic Atherosclerosis (ICD10-I70.0). Electronically Signed   By: Anner Crete M.D.   On: 08/23/2021  00:19   DG Abd 1 View  Result Date: 08/22/2021 CLINICAL DATA:  Feeding tube adjustment EXAM: ABDOMEN - 1 VIEW COMPARISON:  Abdominal x-ray 08/21/2021 FINDINGS: Enteric tube tip is in the proximal duodenum with injected contrast visualized in the duodenum. IMPRESSION: As above. Electronically Signed   By: Ofilia Neas M.D.   On: 08/22/2021 08:57   DG Abd Portable 1V  Result Date: 08/21/2021 CLINICAL DATA:  Feeding tube placement EXAM: PORTABLE ABDOMEN - 1 VIEW COMPARISON:  None. FINDINGS: Heart is enlarged. Atherosclerotic calcification of the aortic arch. Visualized lungs are clear. Visualized upper abdomen is unremarkable. Distal tip of the feeding tube projecting over the gastric fundus. IMPRESSION: Feeding tube with distal tip projecting over the gastric fundus, it could be advanced 5-8 cm. Electronically Signed  By: Keane Police D.O.   On: 08/21/2021 17:35   CT EXTREMITY LOWER LEFT WO CONTRAST  Result Date: 08/23/2021 CLINICAL DATA:  Infection suspected, bilateral below-knee amputation. Is EXAM: CT OF THE LOWER LEFT EXTREMITY WITHOUT CONTRAST TECHNIQUE: Multidetector CT imaging of the lower left extremity was performed according to the standard protocol. COMPARISON:  None. FINDINGS: Bones/Joint/Cartilage Status post bilateral below-knee amputation. No acute osseous abnormality. Ligaments Suboptimally assessed by CT. Muscles and Tendons Mild muscle atrophy.  No intramuscular hematoma or fluid collection. Soft tissues There is mild skin thickening and subcutaneous soft tissue edema on the lateral aspect of the proximal femur. There is also mild skin thickening and subcutaneous soft tissue edema about the amputation site. There is trace amount of fluid in the popliteal fossa without evidence of drainable fluid collection or abscess. Prominent vascular calcifications. IMPRESSION: 1.  No evidence of drainable fluid collection or abscess.2. 2. Mild skin thickening and subcutaneous soft tissue  edema about the lateral aspect of the thigh as well as about the amputation site. Electronically Signed   By: Keane Police D.O.   On: 08/23/2021 07:46   Medications: Infusions:   ceFAZolin (ANCEF) IV Stopped (08/22/21 2336)   feeding supplement (JEVITY 1.2 CAL) 40 mL/hr at 08/23/21 0430    Scheduled Medications:  acetaminophen  650 mg Per NG tube BID   amLODipine  5 mg Per NG tube Daily   aspirin  81 mg Per NG tube Daily   Chlorhexidine Gluconate Cloth  6 each Topical Daily   feeding supplement (PROSource TF)  45 mL Per Tube BID   free water  150 mL Per Tube Q4H   heparin  5,000 Units Subcutaneous Q8H   hydrocerin   Topical BID   thiamine  100 mg Intravenous Daily    have reviewed scheduled and prn medications.  Physical Exam: General:  mittens on -   eyes open-  is responding some to being worked on-  trying to verbalize some today Heart: RRR Lungs: poor effort Abdomen: obese, distended Extremities:  bilat BKA-  post op from knee I and D-  less red    08/23/2021,8:33 AM  LOS: 5 days

## 2021-08-23 NOTE — Progress Notes (Signed)
Speech Language Pathology Treatment: Dysphagia  Patient Details Name: Leslie [name] MRN: [id number] DOB: Jul 07, 1946 Today's Date: 08/23/2021 Time: 0819-0909 SLP Time Calculation (min) (ACUTE ONLY): 50 min  Assessment / Plan / Recommendation Clinical Impression  Pt today alert and spoke on phone to friend during session.  SLP provided oral care using toothbrush and suction.  He consumed graham crackers, applesauce, nectar juice, water and ice chips.  Timely swallow noted with liquids and applesauce - however pt with prolonged oral manipulation/transiting with solids (up to 20 seconds with small bolus).  prolonged mastication *edentulous*, difficulty self feeding - ? ataxia/weakness.  No s/s of aspiration - Due to pt reliance on others for feeding *hand over hand assist* and oral deficits, rec dys1/thin diet with  medicine with puree.  Of note, Leslie [name] asked repeatedly about his dog at home (pt lives alone)- worried about its care - he spoke to a friend who is checking into situration.  He also recalled his sister's phone number without cues - SLP wrote number on board for staff.  Cognitive based dysphagia improving, suspect component of weakness/deconditioning and xerostomia impacting tolerance of solids requiring mastication.  Anticipate diet advancement rapidly as pt continues to improve. Using teach back and written precautions, pt educated to diet recommendations/plan.  Will continue to follow for advanced po trials, education and compensations. Messaged Md via secure chat.  Please consider OT referral to help pt with self feeding/ADLs. Thanks.     HPI HPI: 75 yo person living with HTN, HLD, T2DM, COPD, PVD s/p bilateral BKAs, and alcohol use disorder here for altered mental status, found down at home for an unclear amount of time although a neighbor reports not hearing from him in two days. Taken to OR 10/25 for septic native left knee s/p irrigation and debridement. Pt s/p cortrak placement.   Follow up re: swallow function indicated to assess readiness for dietary advancement.  Pt reports "pain everywhere" but does not show overt signs of discomfort.  He recalls SLP Informing him of hospitalization for knee infection and s/p surgery.      SLP Plan  Continue with current plan of care      Recommendations for follow up therapy are one component of a multi-disciplinary discharge planning process, led by the attending physician.  Recommendations may be updated based on patient status, additional functional criteria and insurance authorization.    Recommendations  Diet recommendations: Dysphagia 1 (puree);Thin liquid Liquids provided via: Cup;Straw Medication Administration: Whole meds with puree Supervision: Staff to assist with self feeding Compensations: Slow rate;Small sips/bites;Lingual sweep for clearance of pocketing Postural Changes and/or Swallow Maneuvers: Seated upright 90 degrees;Upright 30-60 min after meal                Oral Care Recommendations: Oral care QID;Staff/trained caregiver to provide oral care Follow up Recommendations: Other (comment) (TBD) SLP Visit Diagnosis: Dysphagia, oral phase (R13.11);Dysphagia, unspecified (R13.10) Plan: Continue with current plan of care       GO              [name], MS [place] Office [phone] Pager [phone]   [name]  08/23/2021, 9:31 AM

## 2021-08-24 DIAGNOSIS — Z89511 Acquired absence of right leg below knee: Secondary | ICD-10-CM | POA: Diagnosis not present

## 2021-08-24 DIAGNOSIS — M00062 Staphylococcal arthritis, left knee: Secondary | ICD-10-CM | POA: Diagnosis not present

## 2021-08-24 DIAGNOSIS — L03116 Cellulitis of left lower limb: Secondary | ICD-10-CM

## 2021-08-24 DIAGNOSIS — Z89512 Acquired absence of left leg below knee: Secondary | ICD-10-CM | POA: Diagnosis not present

## 2021-08-24 LAB — CBC
HCT: 26.3 % — ABNORMAL LOW (ref 39.0–52.0)
Hemoglobin: 8.4 g/dL — ABNORMAL LOW (ref 13.0–17.0)
MCH: 27.5 pg (ref 26.0–34.0)
MCHC: 31.9 g/dL (ref 30.0–36.0)
MCV: 86.2 fL (ref 80.0–100.0)
Platelets: 285 10*3/uL (ref 150–400)
RBC: 3.05 MIL/uL — ABNORMAL LOW (ref 4.22–5.81)
RDW: 17.2 % — ABNORMAL HIGH (ref 11.5–15.5)
WBC: 13.9 10*3/uL — ABNORMAL HIGH (ref 4.0–10.5)
nRBC: 0 % (ref 0.0–0.2)

## 2021-08-24 LAB — BASIC METABOLIC PANEL
Anion gap: 11 (ref 5–15)
BUN: 47 mg/dL — ABNORMAL HIGH (ref 8–23)
CO2: 23 mmol/L (ref 22–32)
Calcium: 8.2 mg/dL — ABNORMAL LOW (ref 8.9–10.3)
Chloride: 99 mmol/L (ref 98–111)
Creatinine, Ser: 1.86 mg/dL — ABNORMAL HIGH (ref 0.61–1.24)
GFR, Estimated: 37 mL/min — ABNORMAL LOW (ref >60)
Glucose, Bld: 312 mg/dL — ABNORMAL HIGH (ref 70–99)
Potassium: 3.6 mmol/L (ref 3.5–5.1)
Sodium: 133 mmol/L — ABNORMAL LOW (ref 135–145)

## 2021-08-24 LAB — RENAL FUNCTION PANEL
Albumin: 1.9 g/dL — ABNORMAL LOW (ref 3.5–5.0)
Anion gap: 10 (ref 5–15)
BUN: 58 mg/dL — ABNORMAL HIGH (ref 8–23)
CO2: 25 mmol/L (ref 22–32)
Calcium: 8.7 mg/dL — ABNORMAL LOW (ref 8.9–10.3)
Chloride: 105 mmol/L (ref 98–111)
Creatinine, Ser: 2.35 mg/dL — ABNORMAL HIGH (ref 0.61–1.24)
GFR, Estimated: 28 mL/min — ABNORMAL LOW (ref >60)
Glucose, Bld: 256 mg/dL — ABNORMAL HIGH (ref 70–99)
Phosphorus: 3.3 mg/dL (ref 2.5–4.6)
Potassium: 3.1 mmol/L — ABNORMAL LOW (ref 3.5–5.1)
Sodium: 140 mmol/L (ref 135–145)

## 2021-08-24 LAB — AEROBIC/ANAEROBIC CULTURE W GRAM STAIN (SURGICAL/DEEP WOUND)

## 2021-08-24 LAB — GLUCOSE, CAPILLARY
Glucose-Capillary: 225 mg/dL — ABNORMAL HIGH (ref 70–99)
Glucose-Capillary: 319 mg/dL — ABNORMAL HIGH (ref 70–99)
Glucose-Capillary: 254 mg/dL — ABNORMAL HIGH (ref 70–99)
Glucose-Capillary: 253 mg/dL — ABNORMAL HIGH (ref 70–99)

## 2021-08-24 LAB — MAGNESIUM: Magnesium: 1.5 mg/dL — ABNORMAL LOW (ref 1.7–2.4)

## 2021-08-24 MED ORDER — ALUM & MAG HYDROXIDE-SIMETH 200-200-20 MG/5ML PO SUSP
30.0000 mL | ORAL | Status: DC | PRN
  Administered 2021-08-24 – 2021-08-25 (×2): 30 mL via ORAL
  Filled 2021-08-24 (×2): qty 30

## 2021-08-24 MED ORDER — MAGNESIUM SULFATE 2 GM/50ML IV SOLN
2.0000 g | Freq: Once | INTRAVENOUS | Status: AC
  Administered 2021-08-24: 2 g via INTRAVENOUS
  Filled 2021-08-24: qty 50

## 2021-08-24 MED ORDER — HYDRALAZINE HCL 25 MG PO TABS
25.0000 mg | ORAL_TABLET | Freq: Three times a day (TID) | ORAL | Status: DC
  Administered 2021-08-24 – 2021-08-25 (×3): 25 mg via ORAL
  Filled 2021-08-24 (×3): qty 1

## 2021-08-24 MED ORDER — SODIUM CHLORIDE 0.9 % IV BOLUS
500.0000 mL | Freq: Once | INTRAVENOUS | Status: AC
  Administered 2021-08-24: 500 mL via INTRAVENOUS

## 2021-08-24 MED ORDER — POTASSIUM CHLORIDE 10 MEQ/100ML IV SOLN
10.0000 meq | INTRAVENOUS | Status: DC
  Administered 2021-08-24: 10 meq via INTRAVENOUS
  Filled 2021-08-24 (×3): qty 100

## 2021-08-24 MED ORDER — CEFAZOLIN SODIUM-DEXTROSE 2-4 GM/100ML-% IV SOLN
2.0000 g | Freq: Three times a day (TID) | INTRAVENOUS | Status: DC
  Administered 2021-08-25 – 2021-09-11 (×50): 2 g via INTRAVENOUS
  Filled 2021-08-24 (×51): qty 100

## 2021-08-24 MED ORDER — THIAMINE HCL 100 MG PO TABS
100.0000 mg | ORAL_TABLET | Freq: Every day | ORAL | Status: DC
  Administered 2021-08-24 – 2021-09-13 (×20): 100 mg via ORAL
  Filled 2021-08-24 (×22): qty 1

## 2021-08-24 MED ORDER — CEFAZOLIN SODIUM-DEXTROSE 2-4 GM/100ML-% IV SOLN: 2.0000 g | Freq: Three times a day (TID) | INTRAVENOUS | Status: DC

## 2021-08-24 MED ORDER — POTASSIUM CHLORIDE 20 MEQ PO PACK
40.0000 meq | PACK | Freq: Two times a day (BID) | ORAL | Status: AC
  Administered 2021-08-24 (×2): 40 meq via ORAL
  Filled 2021-08-24 (×2): qty 2

## 2021-08-24 NOTE — Progress Notes (Signed)
Subjective:  Seen while working with PT today.  They say he follows commands but remains confused.  I/Os yesterday 1.4 / 2.4. BUN/Cr improved from 70/3.6 to 58/2.35 since foley placed Thurs PM.   Objective Vital signs in last 24 hours: Vitals:   08/23/21 2314 08/24/21 0339 08/24/21 0500 08/24/21 0859  BP: (!) 165/67 (!) 174/72  (!) 163/81  Pulse: 93 87  100  Resp: 18 14  20   Temp: 99.1 F (37.3 C) 97.7 F (36.5 C)  97.7 F (36.5 C)  TempSrc: Axillary Oral  Oral  SpO2: 99% 96%  97%  Weight:   85.5 kg    Weight change:   Intake/Output Summary (Last 24 hours) at 08/24/2021 0957 Last data filed at 08/24/2021 3474 Gross per 24 hour  Intake 1394.42 ml  Output 1850 ml  Net -455.58 ml     Assessment/Plan: 75 year old WM admitted with AMS found to have septic L knee and AKI.    AKI on CKD:  Cr was 1.5 in June (reported in the chart) now with severe AKI with Cr initially ~6.  UA ok; suspected ATN related to sepsis. U/S did not initially show obstruction but then developed urinary retention 10/28 with 1.4L urine on foley placement.  Thankfully renal function substantially improved after foley placed.  Cont to monitor daily labs, I/Os, dose meds for CrCl.  Removed fleets from med list. Looking euvolemic and taking po now.    2. Hypertension:  has been fairly hypertensive, on amlodipine, hydralazine added.  3. Metabolic acidosis-  resolved 4. Septic knee - s/p I&D OR 10/24.  abx simplified to ancef -  WBC improving. ID following 5. Anemia  - hgb 8s, follow, transfuse PRN.  6. AMS- cannot explain-  BUN at his level would not do-  I would think sedating meds would be out of system by now-  but actually got them Monday night  ? HCT negative for CVA on admit- previously lived alone ?  Is improving but only VERY SLOWLY-  May need to get palliative on the case if cont to not be able to respond or make decisions.    Justin Mend    Labs: Basic Metabolic Panel: Recent Labs  Lab  08/22/21 0136 08/23/21 0131 08/24/21 0056  NA 140 140 140  K 4.2 3.7 3.1*  CL 104 105 105  CO2 20* 22 25  GLUCOSE 164* 244* 256*  BUN 56* 70* 58*  CREATININE 3.20* 3.57* 2.35*  CALCIUM 9.0 8.8* 8.7*  PHOS 4.5 4.4 3.3    Liver Function Tests: Recent Labs  Lab 08/19/21 0529 08/21/21 0217 08/22/21 0136 08/23/21 0131 08/24/21 0056  AST 43*  --  65* 60*  --   ALT 14  --  20 14  --   ALKPHOS 192*  --  181* 223*  --   BILITOT 2.3*  --  3.2* 1.7*  --   PROT 7.1  --  7.5 7.5  --   ALBUMIN 2.2*   < > 2.2*  2.2* 2.1*  2.1* 1.9*   < > = values in this interval not displayed.    Recent Labs  Lab 08/22/21 0136  LIPASE 73*    No results for input(s): AMMONIA in the last 168 hours. CBC: Recent Labs  Lab 08/18/21 1736 08/18/21 2323 08/20/21 0147 08/21/21 0217 08/22/21 0136 08/23/21 0131 08/24/21 0056  WBC 27.1*   < > 18.0* 16.5* 16.8* 16.1* 13.9*  NEUTROABS 22.3*  --   --   --   --   --   --  HGB 9.2*   < > 8.9* 8.9* 9.2* 9.2* 8.4*  HCT 28.2*   < > 27.4* 28.0* 28.6* 28.1* 26.3*  MCV 84.7   < > 86.4 86.2 85.9 85.4 86.2  PLT 403*   < > 381 343 362 349 285   < > = values in this interval not displayed.    Cardiac Enzymes: Recent Labs  Lab 08/18/21 1736  CKTOTAL 221    CBG: Recent Labs  Lab 08/23/21 0836 08/23/21 1205 08/23/21 1756 08/23/21 2053 08/24/21 0556  GLUCAP 219* 299* 281* 320* 225*     Iron Studies:  No results for input(s): IRON, TIBC, TRANSFERRIN, FERRITIN in the last 72 hours.  Studies/Results: CT ABDOMEN PELVIS WO CONTRAST  Result Date: 08/23/2021 CLINICAL DATA:  Abdominal pain. EXAM: CT ABDOMEN AND PELVIS WITHOUT CONTRAST TECHNIQUE: Multidetector CT imaging of the abdomen and pelvis was performed following the standard protocol without IV contrast. COMPARISON:  Abdominal radiograph dated 08/22/2021. FINDINGS: Evaluation of this exam is limited in the absence of intravenous contrast. Lower chest: The visualized lung bases are clear.  Several pleural based calcifications noted which may be sequela of prior asbestos exposure. Three vessel coronary vascular calcification. A 12 mm nodular density at the left lung base (60/5), possibly with focus of scarring. This is however new since the study of 01/03/2019. Follow-up recommended. No intra-abdominal free air or free fluid. Hepatobiliary: Small calcified liver granuloma. The liver is otherwise unremarkable. No intrahepatic biliary dilatation. The gallbladder is unremarkable. Pancreas: Unremarkable. No pancreatic ductal dilatation or surrounding inflammatory changes. Spleen: Normal in size without focal abnormality. Adrenals/Urinary Tract: The adrenal glands are unremarkable. There is no hydronephrosis or nephrolithiasis on either side. Renal vascular calcifications. Mild fullness of the renal collecting systems and ureters may be related to reflux. The urinary bladder is distended. Stomach/Bowel: Feeding tube with tip in the region of the proximal duodenum. There is no bowel obstruction or active inflammation. Several small scattered colonic diverticula without active inflammatory changes. The appendix is normal. Vascular/Lymphatic: Advanced aortoiliac atherosclerotic disease. The IVC is unremarkable. No portal venous gas. There is no adenopathy. Reproductive: Enlarged prostate gland measuring 6.6 cm in transverse axial diameter. The seminal vesicles are symmetric. Other: None Musculoskeletal: Degenerative changes of the spine. No acute osseous pathology. IMPRESSION: 1. No acute intra-abdominal or pelvic pathology. No bowel obstruction. Normal appendix. 2. Colonic diverticulosis. 3. Enlarged prostate gland. 4. Distended urinary bladder with probable mild reflux. 5. Aortic Atherosclerosis (ICD10-I70.0). Electronically Signed   By: Anner Crete M.D.   On: 08/23/2021 00:19   CT EXTREMITY LOWER LEFT WO CONTRAST  Result Date: 08/23/2021 CLINICAL DATA:  Infection suspected, bilateral below-knee  amputation. Is EXAM: CT OF THE LOWER LEFT EXTREMITY WITHOUT CONTRAST TECHNIQUE: Multidetector CT imaging of the lower left extremity was performed according to the standard protocol. COMPARISON:  None. FINDINGS: Bones/Joint/Cartilage Status post bilateral below-knee amputation. No acute osseous abnormality. Ligaments Suboptimally assessed by CT. Muscles and Tendons Mild muscle atrophy.  No intramuscular hematoma or fluid collection. Soft tissues There is mild skin thickening and subcutaneous soft tissue edema on the lateral aspect of the proximal femur. There is also mild skin thickening and subcutaneous soft tissue edema about the amputation site. There is trace amount of fluid in the popliteal fossa without evidence of drainable fluid collection or abscess. Prominent vascular calcifications. IMPRESSION: 1.  No evidence of drainable fluid collection or abscess.2. 2. Mild skin thickening and subcutaneous soft tissue edema about the lateral aspect of the thigh as well as  about the amputation site. Electronically Signed   By: Keane Police D.O.   On: 08/23/2021 07:46   Medications: Infusions:   ceFAZolin (ANCEF) IV Stopped (08/23/21 2206)   potassium chloride 10 mEq (08/24/21 0918)    Scheduled Medications:  amLODipine  10 mg Oral Daily   aspirin  81 mg Oral Daily   Chlorhexidine Gluconate Cloth  6 each Topical Daily   heparin  5,000 Units Subcutaneous Q8H   hydrocerin   Topical BID   insulin aspart  0-6 Units Subcutaneous TID WC   thiamine  100 mg Oral Daily    have reviewed scheduled and prn medications.  Physical Exam: General:  awake and interactive with PT  Heart: RRR Lungs: normal WOB on RA Abdomen: obese, distended Extremities:  bilat BKA-  post op from knee I and D, no erythema Neuro: awake and alert, working with PT following commands but confused to time and situation   08/24/2021,9:57 AM  LOS: 6 days

## 2021-08-24 NOTE — Progress Notes (Signed)
Paged this PM. Patient had reported coffee ground emesis around RN shift change. Witnessed by nurse tech. Vitals checked and tachycardia trending up. Trend this afternoon 101>108>111 . EKG checked, sinus tachycardia with prolonged QT. Patient receiving Mg and K.   Patient examined lying in bed. Patient reports feeling no different after emesis. Some continued nausea . Reports potassium powder has caused nausea. He has never had similar episode. No subcutaneous emphysema of neck, breathing comfortably on room air.   - NS hanging, will give 500 cc bolus - Patient consented for blood administration - If tachycardia does not improve with bolus, will give 1 unit of PRBC

## 2021-08-24 NOTE — Progress Notes (Signed)
Speech Language Pathology Treatment: Dysphagia  Patient Details Name: Leslie [name] MRN: [id number] DOB: 14-Jul-1946 Today's Date: 08/24/2021 Time: 5885-0277 SLP Time Calculation (min) (ACUTE ONLY): 28 min  Assessment / Plan / Recommendation Clinical Impression  Pt much more alert when arrived to room.  RN reports pt consumed approx 25% of breakfast *grits.  He is much more alert today and able to self feed without difficulties.  SLP conducted diagnostic treatment re: dysphagia goals with pt consuming soft [place] sandwich, applesauce and water.  Pt without any indication of aspiration - improved tming of oral transiting and no pocketing.  Pt noted to hvae congested voice x1 after slid up- but he reflexively cleared it.  Cough x2 - not related to po intake - pt reports due to h/o smoking observed.  Despite pt taking large boluses, he did not have oral retention or any indication of pharyneal dysphagia.  Did observed pt to consume water or applesauce to aid oral transiting of solids.  Given premorbid diet is regular/thin, pt's mentation and strength significantly improved - recommend regular/thin to allow pt to chose items he can manage. SLP did advise against consuming tough meats, etc and to eat slowly.  Using teach back, written education, pt informed and no SLP follow up indicated.  Thanks.    HPI HPI: 75 yo person living with HTN, HLD, T2DM, COPD, PVD s/p bilateral BKAs, and alcohol use disorder here for altered mental status, found down at home for an unclear amount of time although a neighbor reports not hearing from him in two days. Taken to OR 10/25 for septic native left knee s/p irrigation and debridement. Pt s/p cortrak placement.  Follow up re: swallow function indicated to assess readiness for dietary advancement.  Pt reports "pain everywhere" but does not show overt signs of discomfort.  He recalls SLP Informing him of hospitalization for knee infection and s/p surgery.      SLP Plan   All goals met      Recommendations for follow up therapy are one component of a multi-disciplinary discharge planning process, led by the attending physician.  Recommendations may be updated based on patient status, additional functional criteria and insurance authorization.    Recommendations  Diet recommendations: Regular;Thin liquid Liquids provided via: Cup;Straw Medication Administration: Other (Comment) (as tolerated) Supervision: Patient able to self feed Compensations: Slow rate;Small sips/bites Postural Changes and/or Swallow Maneuvers: Seated upright 90 degrees;Upright 30-60 min after meal                Oral Care Recommendations: Oral care QID;Staff/trained caregiver to provide oral care Follow up Recommendations: None SLP Visit Diagnosis: Dysphagia, oral phase (R13.11);Dysphagia, unspecified (R13.10) Plan: All goals met       GO               [name], MS [facility] SLP Acute Rehab Services Office [phone] Pager [phone]  [name]  08/24/2021, 12:07 PM

## 2021-08-24 NOTE — Progress Notes (Signed)
HD#6 SUBJECTIVE:  Patient Summary: Leslie [name] is a 75 y.o. with a pertinent PMH of HTN, HLD, T2DM, COPD, bilateral BKAs, alcohol use disorder, PAD, and a remote history of hepatitis, who presented with altered mental status and admitted for acute encephalopathy complicated by Acute Kidney Injury on 10/23, on hospital day 7. Patient has significant tenderness to left knee joint and arthrocentesis showed frank pus, patient was taken to OR for I and D 10/24.  Overnight Events: No events overnight.  Interim History:  Patient assessed at bedside this AM.  Patient is alert and able to follow commands.   OBJECTIVE:  Vital Signs: Vitals:   08/23/21 1948 08/23/21 2314 08/24/21 0339 08/24/21 0500  BP: (!) 149/88 (!) 165/67 (!) 174/72   Pulse: 96 93 87   Resp: _0 Temp: 97.7 F (36.5 C) 99.1 F (37.3 C) 97.7 F (36.5 C)   TempSrc: Oral Axillary Oral   SpO2: 100% 99% 96%   Weight:    85.5 kg   Supplemental O2: Nasal Cannula SpO2: 96 % O2 Flow Rate (L/min): 2 L/min  Filed Weights   08/21/21 0316 08/22/21 0331 08/24/21 0500  Weight: 90.1 kg 87.9 kg 85.5 kg     Intake/Output Summary (Last 24 hours) at 08/24/2021 0654 Last data filed at 08/24/2021 0520 Gross per 24 hour  Intake 1394.42 ml  Output 2350 ml  Net -955.58 ml    Net IO Since Admission: -3,778.58 mL [08/24/21 0654]  Physical Exam: General: chronically ill appearing elderly man HENT: thickened skin of nose with bulbous appearance, NCAT Eyes: no scleral icterus, conjunctiva clear CV: normal rate, no murmurs Pulm: crackles present diffusely, normal work of breathing, on Nasal cannula at 2 L GI:no tenderness present, soft, non-distended MSK: bilateral BKAs, erythema, tenderness, and edema improved from yesterday at left stump extending into left knee Skin: warm and dry Neuro: alert and oriented to self only, hard of hearing   Patient Lines/Drains/Airways Status     Active Line/Drains/Airways      Name Placement date Placement time Site Days   Peripheral IV 08/18/21 20 G Left;Posterior Wrist 08/18/21  1724  Wrist  1   Negative Pressure Wound Therapy Knee Anterior;Right 03/28/18  0845  --  1240   External Urinary Catheter 08/18/21  1854  --  1   Incision (Closed) 03/28/18 Leg Right 03/28/18  0839  -- 1240            Pertinent Labs: CBC Latest Ref Rng & Units 08/24/2021 08/23/2021 08/22/2021  WBC 4.0 - 10.5 K/uL 13.9(H) 16.1(H) 16.8(H)  Hemoglobin 13.0 - 17.0 g/dL 8.4(L) 9.2(L) 9.2(L)  Hematocrit 39.0 - 52.0 % 26.3(L) 28.1(L) 28.6(L)  Platelets 150 - 400 K/uL 285 349 362    CMP Latest Ref Rng & Units 08/24/2021 08/23/2021 08/22/2021  Glucose 70 - 99 mg/dL 256(H) 244(H) 164(H)  BUN 8 - 23 mg/dL 58(H) 70(H) 56(H)  Creatinine 0.61 - 1.24 mg/dL 2.35(H) 3.57(H) 3.20(H)  Sodium 135 - 145 mmol/L 140 140 140  Potassium 3.5 - 5.1 mmol/L 3.1(L) 3.7 4.2  Chloride 98 - 111 mmol/L 105 105 104  CO2 22 - 32 mmol/L 25 22 20(L)  Calcium 8.9 - 10.3 mg/dL 8.7(L) 8.8(L) 9.0  Total Protein 6.5 - 8.1 g/dL - 7.5 7.5  Total Bilirubin 0.3 - 1.2 mg/dL - 1.7(H) 3.2(H)  Alkaline Phos 38 - 126 U/L - 223(H) 181(H)  AST 15 - 41 U/L - 60(H) 65(H)  ALT 0 - 44 U/L -  [place]    08/23/21 1756 08/23/21 2053 08/24/21 0556  GLUCAP 281* 320* 225*      Pertinent Imaging: No results found.  ASSESSMENT/PLAN:  Assessment: Principal Problem:   Septic arthritis of knee (HCC) Active Problems:   Acute kidney injury ([place])   Altered mental status   S/P debridement   MSSA (methicillin susceptible Staphylococcus aureus) infection  Leslie [name] is a 75 y.o. with a pertinent PMH of HTN, HLD, T2DM, COPD, bilateral BKAs, alcohol use disorder, PAD, and a remote history of hepatitis, who presented with altered mental status and admitted for acute encephalopathy complicated by Acute Kidney Injury on 10/23, on hospital day 6. Patient has significant tenderness to left knee joint and arthrocentesis  showed frank pus, patient was taken to OR for I and D 10/24.  Plan: MSSA septic arthritis of left knee s/p I&D Leukocytosis Cellulitis of left residual limb Open Arthrotomy completed 10/24 with large purulent effusion that grew MSSA on cefazolin day 4. Base of left residual limb appears less erythematous and edematous today.  Patient still has pain, but appears less painful than previous days.  Leukocytosis improving from 16.1-13.9 today.  Patient will need 6 weeks of IV cefazolin for septic arthritis.  -IV cefazolin 2g q 12 hours, day 4  -Dr. Sharol Given evaluated with recommendations for follow-up in his office on discharge -Appreciate Infectious Disease recommendations -Received 3 days of vancomycin and cefepime, discontinued 10/26 -Blood and urine cultures pending, no growth after 5 days -trend CBC -Tylenol 650 mg, BID for pain -TOC consult for SNF placement  Acute Kidney Injury Acidosis Oliguria-resolved Urinary retention Yesterday creatinine had trended up from 3.2-3.57.  Patient was found to have urinary retention and Foley catheter was placed.  Today his creatinine has improved from 3.57-2.35.  Baseline appears to be around 1.5.    -Nephrology following, appreciate their recommendations -Continue to monitor renal function panel  Type 2 diabetes with peripheral neuropathy Hypoglycemia, improving Initially presented with low BG, home insulin held. A1c of 5.8 on admisison. Glucose trending up into the 200s today. Confirmed carbamazepine prescribed for neuropathy, not seizures, per [facility] provider. Speech eval with recommendations for regular diet with thin liquids.  -CBGs q 4 hours -Sliding scale insulin very sensitive -Regular diet, thin liquids -holding carbamazepime due to sedating effect  Alcohol use disorder Transaminitis Unsure of last drink, patient has multiple reasons for altered mental status.  Alk phos 181, AST 65, Albumin 2.2, Total Bilirubin 3.2- indirect 2 and direct  1.2. Repeat INR at 1.2.  -discontinued [place] -Holding sedating medications, including home medications of lyrica and carbamazepine. -thiamine 264 mg -Folic acid 1 mg  Hypertension Home medications include Amlodipine 10 mg, Hydralazine 50 mg TID, and Metoprolol 24 hr 200 mg.  Blood pressure was 112/57 on initial presentation to ED. Patient's blood pressure has trended up into the 150s to 170s.    -Amlodipine 10 mg -Added hydralazine 25 mg TID -Holding Metoprolol and Lasix  EKG changes Prolonged Qtc HFpEF On exam, patient does not appear volume overloaded. Echo showed left ventricular EF of 60 to 65% with no wall motion abnormalities with mild concentric left ventricular hypertrophy.   -Telemetry monitoring -holding home medication of Lasix 20 mg until creatinine improves  Hypovolemic Hyponatremia-resolved Sodium at 137 today.    -BMP trend  Normocytic anemia Trended down to 8.4.  This appears to be around his baseline. Ganzoni equation calculation of 1019 mg iron deficiency. Patient currently has infective process  of left knee joint, will consider iron supplementation once infection resolves.  -Monitor CBC  Hyperlipidemia Lipid panel showed triglycerides of 208 and LDL not able to be calculated.   COPD Albuterol inhaler at home.  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  -On aspirin 81 mg and pentoxifylline 400 mg 3 times daily at home, will restart aspirin to be given through feeding tube.  Best Practice: Diet: NPO due to altered mental status IVF: Fluids: LR, Rate:  150 cc/hr x 20 hrs VTE: heparin injection 5,000 Units Start: 08/19/21 1400 Code: Full AB: cefazolin, day 2    DISPO: Anticipated discharge pending Medical stability.  Signature: Christiana Fuchs, D.O. Internal Medicine Resident, PGY-1 [facility]  Pager: [phone] 6:54 AM, 08/24/2021   Please contact the on call pager after 5 pm and on weekends at  [phone].

## 2021-08-24 NOTE — Evaluation (Signed)
Occupational Therapy Evaluation Patient Details Name: Leslie [name] MRN: [id number] DOB: April 28, 1946 Today's Date: 08/24/2021   History of Present Illness Pt is a 75 y.o. male admitted 08/18/21 with AMS and L knee pain; workup for L knee sepsis. S/p L knee aspiration, then L knee I&D on 10/24. PMH includes PAD s/p bilateral BKA (left 2017, right 2019), cellulitis, COPD, gout, arthritis, DM2.   Clinical Impression   Pt admitted with the above diagnoses and presents with below problem list. Pt will benefit from continued acute OT to address the below listed deficits and maximize independence with basic ADLs prior to d/c to venue below. At baseline, pt reports he was mod I to setup with BADLs, showers at [facility] center where he is able to have setup/supervision. Pt appears to be struggling with ADLs as evidenced by sacral wounds, dirt under fingernails and long/unkept beard and hair. Pt found to be incontinent of bowel at start of session, pt unaware of this. Able to to roll to each side with +1 assist, total A for pericare. Pt will need ST rehab at SNF at time of d/c and need to consider a higher level of care setting post-rehab such as ALF or [place] services.        Recommendations for follow up therapy are one component of a multi-disciplinary discharge planning process, led by the attending physician.  Recommendations may be updated based on patient status, additional functional criteria and insurance authorization.   Follow Up Recommendations  Skilled nursing-short term rehab (<3 hours/day)    Assistance Recommended at Discharge Frequent or constant Supervision/Assistance  Functional Status Assessment  Patient has had a recent decline in their functional status and demonstrates the ability to make significant improvements in function in a reasonable and predictable amount of time.  Equipment Recommendations  Other (comment) (defer to next venue)    Recommendations for Other Services        Precautions / Restrictions Precautions Precautions: Fall Precaution Comments: chronic bilateral BKA (does not have prosthetics at hospital). significant hx of falls Restrictions Weight Bearing Restrictions: No      Mobility Bed Mobility Overal bed mobility: Needs Assistance Bed Mobility: Rolling;Supine to Sit;Sit to Supine Rolling: Mod assist;Min assist   Supine to sit: Mod assist;HOB elevated Sit to supine: Mod assist;HOB elevated   General bed mobility comments: modA of 2 for safety with transition to sitting, but pt able to use bed rails to initiate movements when cued. Assist to elevate trunk. pt completing multiple rolls in bed for pericare and cleaning, good use of bed rails    Transfers                   General transfer comment: deferred due to pt not having prosthetics and with continued BM after being cleaned      Balance Overall balance assessment: Needs assistance;History of Falls Sitting-balance support: Bilateral upper extremity supported;Feet unsupported Sitting balance-Leahy Scale: Poor Sitting balance - Comments: reliant on UE support and min-modA                                   ADL either performed or assessed with clinical judgement   ADL Overall ADL's : Needs assistance/impaired Eating/Feeding: Set up   Grooming: Moderate assistance;Sitting   Upper Body Bathing: Moderate assistance;Sitting   Lower Body Bathing: Maximal assistance;Sitting/lateral leans   Upper Body Dressing : Moderate assistance;Sitting   Lower Body Dressing: Maximal  assistance;Sitting/lateral leans                 General ADL Comments: Pt rolled to each side for pericare at bed level. Pt then sat EOB briefly prior to returning to bed for further cleanup and sacral skin assessment by RN.     Vision         Perception     Praxis      Pertinent Vitals/Pain Pain Assessment: Faces Faces Pain Scale: Hurts little more Pain Location: left knee  when slid up in bed Pain Descriptors / Indicators: Grimacing Pain Intervention(s): Limited activity within patient's tolerance;Monitored during session;Repositioned     Hand Dominance Right   Extremity/Trunk Assessment Upper Extremity Assessment Upper Extremity Assessment: Generalized weakness   Lower Extremity Assessment Lower Extremity Assessment: Defer to PT evaluation RLE Deficits / Details: chronic R BKA with good ability to move against gravity, achieve full knee extension and with good movement at hip. slight limitation in HS length LLE Deficits / Details: grossly limited by pain, but pt able to demo good mobility at hip. sores and scabs around end of leg. unable to achieve full knee ext or flexion LLE: Unable to fully assess due to pain   Cervical / Trunk Assessment Cervical / Trunk Assessment: Kyphotic   Communication Communication Communication: Expressive difficulties (mumbling at times)   Cognition Arousal/Alertness: Awake/alert Behavior During Therapy: WFL for tasks assessed/performed Overall Cognitive Status: No family/caregiver present to determine baseline cognitive functioning Area of Impairment: Attention;Memory;Following commands;Safety/judgement;Awareness;Problem solving                   Current Attention Level: Focused Memory: Decreased recall of precautions;Decreased short-term memory Following Commands: Follows one step commands inconsistently;Follows one step commands with increased time Safety/Judgement: Decreased awareness of safety;Decreased awareness of deficits Awareness: Intellectual Problem Solving: Slow processing;Decreased initiation;Difficulty sequencing;Requires verbal cues General Comments: pt needing cues for all mobility, unaware of BM, and at times not responding appropriately to quesitons or instructions from staff.     General Comments  VSS on RA, pt with loose BM and unaware. sacral wounds noted during clean up    Exercises     Shoulder Instructions      Home Living Family/patient expects to be discharged to:: Private residence Living Arrangements: Alone Available Help at Discharge: Friend(s);Available PRN/intermittently Type of Home: Independent living facility Home Access: Ramped entrance     Home Layout: One level           Bathroom Accessibility: Yes   Home Equipment: Conservation officer, nature (2 wheels);Wheelchair - power;Hospital bed   Additional Comments: Meals on weals and [employer] for meals. Showers at [employer]. Pt reports he drives a 3 wheel scooter.      Prior Functioning/Environment Prior Level of Function : History of Falls (last six months);Independent/Modified Independent             Mobility Comments: mostly w/c level. can transfer to/from w/c mod I ADLs Comments: setup and superivision for showers at [employer]. Appears to be struggling with BADLs at home. Dirt under fingernails, sacral wound, long and unkept hair and beard.        OT Problem List:        OT Treatment/Interventions:      OT Goals(Current goals can be found in the care plan section) Acute Rehab OT Goals Patient Stated Goal: not stated OT Goal Formulation: With patient Time For Goal Achievement: 09/07/21 Potential to Achieve Goals: Good  OT Frequency:     Barriers to  D/C:            Co-evaluation PT/OT/SLP Co-Evaluation/Treatment: Yes Reason for Co-Treatment: Complexity of the patient's impairments (multi-system involvement);Necessary to address cognition/behavior during functional activity;For patient/therapist safety;To address functional/ADL transfers PT goals addressed during session: Mobility/safety with mobility;Balance OT goals addressed during session: ADL's and self-care      AM-PAC OT "6 Clicks" Daily Activity     Outcome Measure Help from another person eating meals?: A Little Help from another person taking care of personal grooming?: A Lot Help from another person toileting, which includes using toliet,  bedpan, or urinal?: Total Help from another person bathing (including washing, rinsing, drying)?: Total Help from another person to put on and taking off regular upper body clothing?: A Little Help from another person to put on and taking off regular lower body clothing?: A Lot 6 Click Score: 12   End of Session    Activity Tolerance:   Patient left:                     Time: 1610-9604 OT Time Calculation (min): 37 min Charges:  OT General Charges $OT Visit: 1 Visit OT Evaluation $OT Eval Moderate Complexity: 1 Mod  Tyrone Schimke, OT Acute Rehabilitation Services Pager: [phone] Office: [phone]   Hortencia Pilar 08/24/2021, 1:00 PM

## 2021-08-24 NOTE — Consult Note (Signed)
ORTHOPAEDIC CONSULTATION  REQUESTING PHYSICIAN: Sid Falcon, MD  Chief Complaint: Septic left knee status post bilateral below-knee amputations.  HPI: Leslie [name] is a 75 y.o. male who presents with septic left knee he underwent open debridement with Dr. Ihor Gully.  Patient has been having pain in both residual limbs and patient is seen for evaluation for possible abscess in the residual limb.  Past Medical History:  Diagnosis Date   Anxiety    Panic attack - 10  years aago- ? panic    Asthma    Cellulitis of left foot 2016   Healed and recurred 01/2016   Cellulitis of right foot 06/30/2012   Chronic lower back pain 07/01/2012   COPD (chronic obstructive pulmonary disease) ([place])    Uses nebulizers at home. Former smoker, current chewing tobacco. 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   GERD (gastroesophageal reflux disease)    Gouty arthritis 07/01/2012   Not attacks in ~5 yrs (03/2016)   Hepatitis 1967   "in jail when I got it; dr said it was pretty bad; quaranteened X 30d"   Hyperlipidemia    Hypertension    Lung nodule, solitary 07/2015   Due for 1-yr recheck in 07/2017   Peripheral arterial disease ([place])    critical limb ischemia   Peripheral neuropathy 07/01/2012   Seasonal allergies    Skin growth 07/01/2012   right side of nares; "been on there 20 years"   Substance abuse ([place])    Type II diabetes mellitus ([place]) 2002   Diagnosed in 2002   Past Surgical History:  Procedure Laterality Date   AMPUTATION Left 03/28/2016   Procedure: AMPUTATION left foot 4th and 5 th ray;  Surgeon: Newt Minion, MD;  Location: [place];  Service: Orthopedics;  Laterality: Left;   AMPUTATION Left 04/11/2016   Procedure: LEFT BELOW KNEE AMPUTATION;  Surgeon: Newt Minion, MD;  Location: [place];  Service: Orthopedics;  Laterality: Left;   AMPUTATION Right 01/22/2018   Procedure: RIGHT FOOT 3RD RAY AMPUTATION;  Surgeon: Newt Minion, MD;  Location: [place];  Service: Orthopedics;  Laterality: Right;   AMPUTATION Right  02/19/2018   Procedure: RIGHT TRANSMETATARSAL AMPUTATION;  Surgeon: Newt Minion, MD;  Location: [place];  Service: Orthopedics;  Laterality: Right;   AMPUTATION Right 03/28/2018   Procedure: RIGHT BELOW KNEE AMPUTATION;  Surgeon: Newt Minion, MD;  Location: [place];  Service: Orthopedics;  Laterality: Right;   I & D EXTREMITY Left 08/19/2021   Procedure: IRRIGATION AND DEBRIDEMENT LEFT KNEE;  Surgeon: Paralee Cancel, MD;  Location: [place];  Service: Orthopedics;  Laterality: Left;   LEG AMPUTATION BELOW KNEE Left 04/11/2016   LOWER EXTREMITY ANGIOGRAM N/A 04/18/2013   Procedure: LOWER EXTREMITY ANGIOGRAM;  Surgeon: Sherren Mocha, MD;  Location[facility];  Service: Cardiovascular;  Laterality: N/A;   LOWER EXTREMITY ANGIOGRAM  08/13/2015   bilateral iliac    PERIPHERAL VASCULAR CATHETERIZATION Bilateral 08/13/2015   Procedure: Lower Extremity Angiography;  Surgeon: Lorretta Harp, MD;  Location: [place] CV LAB;  Service: Cardiovascular;  Laterality: Bilateral;   PERIPHERAL VASCULAR CATHETERIZATION N/A 08/13/2015   Procedure: Abdominal Aortogram;  Surgeon: Lorretta Harp, MD;  Location: [place] [facility];  Service: Cardiovascular;  Laterality: N/A;   TOE AMPUTATION Right 01/22/2018   3RD TOE RIGHT FOOT   Social History   Socioeconomic History   Marital status: Widowed    Spouse name: Not on file   Number of children: Not on file  Years of education: Not on file   Highest education level: Not on file  Occupational History   Occupation: disabled  Tobacco Use   Smoking status: Former    Packs/day: 2.00    Years: 40.00    Pack years: 80.00    Types: Cigarettes    Quit date: 01/13/2002    Years since quitting: 19.6   Smokeless tobacco: Current    Types: Chew   Tobacco comments:    chews a little tobacco  Vaping Use   Vaping Use: Never used  Substance and Sexual Activity   Alcohol use: Not Currently    Comment: 03/03/18: "Used to drink alot. 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   Lives in assisted living. Performs ADLs and IADLs, including cooking and managing his medications. Friend drives him to the store. Uses rollator walker at home and motorized scooter in store.   [facility] patient   Widowed   5-8 years education   Likes to fish   Pets:  4 cockatiels and 2 lovebirds   Social Determinants of Radio broadcast assistant Strain: Not on file  Food Insecurity: Not on file  Transportation Needs: Not on file  Physical Activity: Not on file  Stress: Not on file  Social Connections: Not on file   Family History  Problem Relation Age of Onset   Diabetes Sister    Hypertension Mother    - negative except otherwise stated in the family history section Allergies  Allergen Reactions   Atorvastatin Other (See Comments)    Unknown reaction   Statins Other (See Comments)    Unknown reaction - on MAR   Flexeril [Cyclobenzaprine] Other (See Comments)    Dry mouth and hallucinations   Prior to Admission medications   Medication Sig Start Date End Date Taking? Authorizing Provider  acetaminophen (TYLENOL) 325 MG tablet Take 2 tablets (650 mg total) by mouth every 6 (six) hours as needed for mild pain (or Fever >/= 101). Patient taking differently: Take 650 mg by mouth every 8 (eight) hours as needed for mild pain (or Fever >/= 101). 03/30/18  Yes Marcelyn Bruins, MD  albuterol (PROVENTIL HFA;VENTOLIN HFA) 108 (90 Base) MCG/ACT inhaler Inhale 2 puffs into the lungs 4 (four) times daily as needed for wheezing or shortness of breath.    Yes [provider]  albuterol (PROVENTIL) (2.5 MG/3ML) 0.083% nebulizer solution Take 2.5 mg by nebulization every 6 (six) hours as needed for wheezing or shortness of  breath.   Yes [provider]  allopurinol (ZYLOPRIM) 100 MG tablet Take 100 mg by mouth at bedtime. For gout   Yes [provider]  amLODipine (NORVASC) 10 MG tablet Take 1 tablet (10 mg total) by mouth daily. 08/09/12  Yes Funches, Adriana Mccallum, MD  aspirin 81 MG chewable tablet Chew 81 mg by mouth daily.   Yes [provider]  calcium carbonate (TUMS EX) 750 MG chewable tablet Chew 1 tablet by mouth 2 (two) times daily as needed for heartburn.   Yes [provider]  carbamazepine (TEGRETOL XR) 200 MG 12 hr tablet Take 200 mg by mouth 2 (two) times daily.   Yes [provider]  Cholecalciferol (VITAMIN D-3) 1000 units CAPS Take 1,000 Units by mouth daily.   Yes [provider]  Evolocumab with Infusor ([place]) 420 MG/3.5ML SOCT Inject 420 mg into the skin every 30 (thirty) days.   Yes [provider]  famotidine (PEPCID) 20 MG tablet Take 20 mg by mouth at bedtime.   Yes [provider]  ferrous sulfate 325 (65 FE) MG tablet Take 325 mg by mouth every other day.   Yes [provider]  insulin glargine (LANTUS) 100 UNIT/ML injection Inject 75 Units into the skin daily.   Yes [provider]  LORazepam (ATIVAN) 0.5 MG tablet Take 0.25 mg by mouth daily as needed for anxiety (x2 months). 07/30/21 10/22/21 Yes [provider]  losartan-hydrochlorothiazide (HYZAAR) 100-25 MG tablet Take 1 tablet by mouth daily.   Yes [provider]  Melatonin 10 MG TABS Take 10 mg by mouth at bedtime.   Yes [provider]  metFORMIN (GLUCOPHAGE) 850 MG tablet Take 850 mg by mouth 2 (two) times daily with a meal.   Yes [provider]  metoprolol (TOPROL-XL) 200 MG 24 hr tablet Take 200 mg by mouth daily. Take with or immediately following a meal.   Yes [provider]  Multiple Vitamins-Minerals (CERTAVITE SENIOR/ANTIOXIDANT) TABS Take 1 tablet by mouth daily.   Yes  [provider]  nitroGLYCERIN (NITROSTAT) 0.4 MG SL tablet Place 0.4 mg under the tongue every 5 (five) minutes x 3 doses as needed for chest pain.  11/12/11  Yes Funches, Josalyn, MD  polyethylene glycol (MIRALAX / GLYCOLAX) 17 g packet Take 17 g by mouth every other day.   Yes [provider]  senna-docusate (SENOKOT-S) 8.6-50 MG tablet Take 2 tablets by mouth daily.   Yes [provider]  Skin Protectants, Misc. (MINERIN) CREA Apply 1 application topically 2 (two) times daily. For dryness   Yes [provider]  traZODone (DESYREL) 50 MG tablet Take 50 mg by mouth at bedtime.   Yes [provider]  vitamin B-12 (CYANOCOBALAMIN) 1000 MCG tablet Take 1,000 mcg by mouth daily.   Yes [provider]  bisacodyl (DULCOLAX) 10 MG suppository Place 10 mg rectally once as needed (for constipation not relieved by Milk of Magnesia). Patient not taking: Reported on 08/22/2021    [provider]  carbamazepine (TEGRETOL) 200 MG tablet Take 200-300 mg by mouth See admin instructions. Take 1 tablet (200 mg) by mouth every morning and 1 1/2 tablet (300 mg) every evening Patient not taking: No sig reported    [provider]  clonazePAM (KLONOPIN) 1 MG tablet Take 1 mg by mouth at bedtime.  Patient not taking: Reported on 08/22/2021    [provider]  DULoxetine (CYMBALTA) 60 MG capsule Take 60 mg by mouth daily. Patient not taking: Reported on 08/22/2021    [provider]  furosemide (LASIX) 20 MG tablet Take 20 mg by mouth daily. Patient not taking: Reported on 08/22/2021    [provider]  hydrALAZINE (APRESOLINE) 50 MG tablet Take 50 mg by mouth 3 (three) times daily. Patient not taking: Reported on 08/22/2021    [provider]  insulin glargine (LANTUS) 100 UNIT/ML injection Inject 20 Units into the skin daily.  Patient not taking: No sig reported    [provider]  losartan (COZAAR)  100 MG tablet Take 100 mg by mouth daily. Patient not taking: No sig reported    [provider]  metFORMIN (GLUCOPHAGE)  1000 MG tablet Take 1 tablet (1,000 mg total) by mouth 2 (two) times daily with a meal. Patient not taking: No sig reported 08/16/15   Eileen Stanford, PA-C  mirtazapine (REMERON) 7.5 MG tablet Take 7.5 mg by mouth at bedtime. Patient not taking: Reported on 08/22/2021    [provider]  pentoxifylline (TRENTAL) 400 MG CR tablet Take 1 tablet (400 mg total) by mouth 3 (three) times daily with meals. Patient not taking: Reported on 08/22/2021 02/04/18   Newt Minion, MD  pregabalin (LYRICA) 150 MG capsule Take 150 mg by mouth 3 (three) times daily.  Patient not taking: Reported on 08/22/2021    [provider]  ranitidine (ZANTAC) 300 MG tablet Take 300 mg by mouth at bedtime. Patient not taking: Reported on 08/22/2021    [provider]  Sodium Phosphates (FLEET ENEMA RE) Place 1 each rectally daily as needed (constipation not relieved by Bisacodyl suppository). Patient not taking: Reported on 08/22/2021    [provider]   CT ABDOMEN PELVIS WO CONTRAST  Result Date: 08/23/2021 CLINICAL DATA:  Abdominal pain. EXAM: CT ABDOMEN AND PELVIS WITHOUT CONTRAST TECHNIQUE: Multidetector CT imaging of the abdomen and pelvis was performed following the standard protocol without IV contrast. COMPARISON:  Abdominal radiograph dated 08/22/2021. FINDINGS: Evaluation of this exam is limited in the absence of intravenous contrast. Lower chest: The visualized lung bases are clear. Several pleural based calcifications noted which may be sequela of prior asbestos exposure. Three vessel coronary vascular calcification. A 12 mm nodular density at the left lung base (60/5), possibly with focus of scarring. This is however new since the study of 01/03/2019. Follow-up recommended. No intra-abdominal free air or free fluid. Hepatobiliary: Small calcified  liver granuloma. The liver is otherwise unremarkable. No intrahepatic biliary dilatation. The gallbladder is unremarkable. Pancreas: Unremarkable. No pancreatic ductal dilatation or surrounding inflammatory changes. Spleen: Normal in size without focal abnormality. Adrenals/Urinary Tract: The adrenal glands are unremarkable. There is no hydronephrosis or nephrolithiasis on either side. Renal vascular calcifications. Mild fullness of the renal collecting systems and ureters may be related to reflux. The urinary bladder is distended. Stomach/Bowel: Feeding tube with tip in the region of the proximal duodenum. There is no bowel obstruction or active inflammation. Several small scattered colonic diverticula without active inflammatory changes. The appendix is normal. Vascular/Lymphatic: Advanced aortoiliac atherosclerotic disease. The IVC is unremarkable. No portal venous gas. There is no adenopathy. Reproductive: Enlarged prostate gland measuring 6.6 cm in transverse axial diameter. The seminal vesicles are symmetric. Other: None Musculoskeletal: Degenerative changes of the spine. No acute osseous pathology. IMPRESSION: 1. No acute intra-abdominal or pelvic pathology. No bowel obstruction. Normal appendix. 2. Colonic diverticulosis. 3. Enlarged prostate gland. 4. Distended urinary bladder with probable mild reflux. 5. Aortic Atherosclerosis (ICD10-I70.0). Electronically Signed   By: Anner Crete M.D.   On: 08/23/2021 00:19   CT EXTREMITY LOWER LEFT WO CONTRAST  Result Date: 08/23/2021 CLINICAL DATA:  Infection suspected, bilateral below-knee amputation. Is EXAM: CT OF THE LOWER LEFT EXTREMITY WITHOUT CONTRAST TECHNIQUE: Multidetector CT imaging of the lower left extremity was performed according to the standard protocol. COMPARISON:  None. FINDINGS: Bones/Joint/Cartilage Status post bilateral below-knee amputation. No acute osseous abnormality. Ligaments Suboptimally assessed by CT. Muscles and Tendons Mild  muscle atrophy.  No intramuscular hematoma or fluid collection. Soft tissues There is mild skin thickening and subcutaneous soft tissue edema on the lateral aspect of the proximal femur. There is also mild skin thickening and subcutaneous soft  tissue edema about the amputation site. There is trace amount of fluid in the popliteal fossa without evidence of drainable fluid collection or abscess. Prominent vascular calcifications. IMPRESSION: 1.  No evidence of drainable fluid collection or abscess.2. 2. Mild skin thickening and subcutaneous soft tissue edema about the lateral aspect of the thigh as well as about the amputation site. Electronically Signed   By: Keane Police D.O.   On: 08/23/2021 07:46   - pertinent xrays, CT, MRI studies were reviewed and independently interpreted  Positive ROS: All other systems have been reviewed and were otherwise negative with the exception of those mentioned in the HPI and as above.  Physical Exam: General: Alert, no acute distress Psychiatric: Patient is competent for consent with normal mood and affect Lymphatic: No axillary or cervical lymphadenopathy Cardiovascular: No pedal edema Respiratory: No cyanosis, no use of accessory musculature GI: No organomegaly, abdomen is soft and non-tender    Images:  @ENCIMAGES @  Labs:  Lab Results  Component Value Date   HGBA1C 5.8 (H) 08/19/2021   HGBA1C 7.1 (H) 02/19/2018   HGBA1C 7.2 (H) 03/27/2016   ESRSEDRATE 90 (H) 03/27/2016   CRP 23.4 (H) 03/27/2016   LABURIC 4.8 08/09/2012   REPTSTATUS PENDING 08/19/2021   GRAMSTAIN  08/19/2021    NO ORGANISMS SEEN SQUAMOUS EPITHELIAL CELLS PRESENT ABUNDANT WBC PRESENT,BOTH PMN AND MONONUCLEAR FEW GRAM POSITIVE COCCI Performed at [facility], [place] [address], [place], [place] [address]    CULT  08/19/2021    MODERATE STAPHYLOCOCCUS AUREUS NO ANAEROBES ISOLATED; CULTURE IN PROGRESS FOR 5 DAYS    LABORGA STAPHYLOCOCCUS AUREUS 08/19/2021    Lab Results   Component Value Date   ALBUMIN 1.9 (L) 08/24/2021   ALBUMIN 2.1 (L) 08/23/2021   ALBUMIN 2.1 (L) 08/23/2021   PREALBUMIN 13.8 (L) 03/27/2016   LABURIC 4.8 08/09/2012     CBC EXTENDED Latest Ref Rng & Units 08/24/2021 08/23/2021 08/22/2021  WBC 4.0 - 10.5 K/uL 13.9(H) 16.1(H) 16.8(H)  RBC 4.22 - 5.81 MIL/uL 3.05(L) 3.29(L) 3.33(L)  HGB 13.0 - 17.0 g/dL 8.4(L) 9.2(L) 9.2(L)  HCT 39.0 - 52.0 % 26.3(L) 28.1(L) 28.6(L)  PLT 150 - 400 K/uL 285 349 362  NEUTROABS 1.7 - 7.7 K/uL - - -  LYMPHSABS 0.7 - 4.0 K/uL - - -    Neurologic: Patient does not have protective sensation bilateral lower extremities.   MUSCULOSKELETAL:   Skin: Examination patient has no cellulitis or fluctuance in either residual limb.  He has end bearing callus on the left leg secondary to subsiding in the socket.  There is a little bit of redness over the medial aspect of the knee status post open debridement of septic knee.  Patient's white cell count is decreased at 13.9 hemoglobin 8.4.  Albumin 1.9 and hemoglobin A1c 5.8  Reviewed a CT scan left leg shows no evidence of abscess.  Assessment: Assessment: Resolving septic left knee with bilateral transtibial amputations with subsiding in the socket on the left leg.  Plan: Patient will need to follow-up with the prosthetists for either a new socket or modification of his existing socket to prevent the end bearing subsidence.  I will follow-up in the office.  Thank you for the consult and the opportunity to see [name], [place] [phone] 11:17 AM

## 2021-08-24 NOTE — Evaluation (Signed)
Physical Therapy Evaluation Patient Details Name: Leslie [name] MRN: [id number] DOB: 09/20/1946 Today's Date: 08/24/2021  History of Present Illness  Pt is a 75 y.o. male admitted 08/18/21 with AMS and L knee pain; workup for L knee sepsis. S/p L knee aspiration, then L knee I&D on 10/24. PMH includes PAD s/p bilateral BKA (left 2017, right 2019), cellulitis, COPD, gout, arthritis, DM2.   Clinical Impression  Pt in bed upon arrival of PT, agreeable to evaluation at this time. Prior to admission the pt was completing transfers independently, mobilizing primarily with power WC in his home, and attending [facility] program for medical needs and assistance with bathing. The pt reports multiple falls at home both out of bed and with mobility, resulting in him acquiring a life alert. The pt now presents with limitations in functional mobility, strength and ROM in BLE, seated stability, and cognition (problem solving, safety awareness, and memory) due to above dx, and will continue to benefit from skilled PT to address these deficits. The pt was able to complete rolling in bed with modA-minA with use of bed rails and verbal cues, and required cues and assist to initiate movements.   Given current deficits and pt report that he was transferring independently to his power chair PTA, recommend SNF for rehab following d/c. However, given pt report of no caregiver available, repeated falls, and noted deficits in skin integrity, the pt will likely need increased assistance long-term (such as assisted living) after short term rehab.         Recommendations for follow up therapy are one component of a multi-disciplinary discharge planning process, led by the attending physician.  Recommendations may be updated based on patient status, additional functional criteria and insurance authorization.  Follow Up Recommendations Skilled nursing-short term rehab (<3 hours/day)    Assistance Recommended at Discharge Frequent  or constant Supervision/Assistance  Functional Status Assessment Patient has had a recent decline in their functional status and demonstrates the ability to make significant improvements in function in a reasonable and predictable amount of time.  Equipment Recommendations  None recommended by PT (defer to post acute)    Recommendations for Other Services       Precautions / Restrictions Precautions Precautions: Fall Precaution Comments: chronic bilateral BKA (does not have prosthetics at hospital). significant hx of falls Restrictions Weight Bearing Restrictions: No      Mobility  Bed Mobility Overal bed mobility: Needs Assistance Bed Mobility: Rolling;Supine to Sit;Sit to Supine Rolling: Mod assist;Min assist   Supine to sit: Mod assist;HOB elevated Sit to supine: Mod assist;HOB elevated   General bed mobility comments: modA of 2 for safety with transition to sitting, but pt able to use bed rails to initiate movements when cued. Assist to elevate trunk. pt completing multiple rolls in bed for pericare and cleaning, good use of bed rails    Transfers                   General transfer comment: deferred due to pt not having prosthetics and with continued BM after being cleaned       Balance Overall balance assessment: Needs assistance;History of Falls Sitting-balance support: Bilateral upper extremity supported;Feet unsupported Sitting balance-Leahy Scale: Poor Sitting balance - Comments: reliant on UE support and min-modA                                     Pertinent Vitals/Pain  Pain Assessment: Faces Faces Pain Scale: Hurts little more Pain Location: L knee with movement Pain Descriptors / Indicators: Discomfort;Grimacing Pain Intervention(s): Limited activity within patient's tolerance;Monitored during session;Repositioned    Home Living Family/patient expects to be discharged to:: Private residence Living Arrangements: Alone Available  Help at Discharge: Friend(s);Available PRN/intermittently Type of Home: Independent living facility Home Access: Ramped entrance         Home Equipment: [place] (2 wheels);Wheelchair - power;Hospital bed Additional Comments: Meals on weals and [facility] for meals. Showers at [employer]. Pt reports he drives a 3 wheel scooter.    Prior Function Prior Level of Function : History of Falls (last six months);Independent/Modified Independent             Mobility Comments: mostly w/c level. can transfer to/from w/c mod I ADLs Comments: setup and superivision for showers at [facility].     Hand Dominance   Dominant Hand: Right    Extremity/Trunk Assessment   Upper Extremity Assessment Upper Extremity Assessment: Defer to OT evaluation    Lower Extremity Assessment Lower Extremity Assessment: RLE deficits/detail;LLE deficits/detail RLE Deficits / Details: chronic R BKA with good ability to move against gravity, achieve full knee extension and with good movement at hip. slight limitation in HS length LLE Deficits / Details: grossly limited by pain, but pt able to demo good mobility at hip. sores and scabs around end of leg. unable to achieve full knee ext or flexion LLE: Unable to fully assess due to pain    Cervical / Trunk Assessment Cervical / Trunk Assessment: Kyphotic  Communication   Communication: Expressive difficulties (mumbling at times)  Cognition Arousal/Alertness: Awake/alert Behavior During Therapy: WFL for tasks assessed/performed Overall Cognitive Status: No family/caregiver present to determine baseline cognitive functioning Area of Impairment: Attention;Memory;Following commands;Safety/judgement;Awareness;Problem solving                   Current Attention Level: Focused Memory: Decreased recall of precautions;Decreased short-term memory Following Commands: Follows one step commands inconsistently;Follows one step commands with increased time Safety/Judgement:  Decreased awareness of safety;Decreased awareness of deficits Awareness: Intellectual Problem Solving: Slow processing;Decreased initiation;Difficulty sequencing;Requires verbal cues General Comments: pt needing cues for all mobility, unaware of BM, and at times not responding appropriately to quesitons or instructions from staff.        General Comments General comments (skin integrity, edema, etc.): VSS on RA, pt with loose BM and unaware. sacral wounds noted during clean up    Exercises General Exercises - Lower Extremity Quad Sets: AROM;Both;5 reps;Supine Hip Flexion/Marching: AROM;Both;5 reps;Supine   Assessment/Plan    PT Assessment Patient needs continued PT services  PT Problem List Decreased strength;Decreased range of motion;Decreased activity tolerance;Decreased balance;Decreased mobility;Decreased coordination;Decreased safety awareness;Pain       PT Treatment Interventions DME instruction;Gait training;Stair training;Functional mobility training;Therapeutic activities;Therapeutic exercise;Balance training;Patient/family education    PT Goals (Current goals can be found in the Care Plan section)  Acute Rehab PT Goals Patient Stated Goal: get back to his dog PT Goal Formulation: With patient Time For Goal Achievement: 09/07/21 Potential to Achieve Goals: Fair    Frequency Min 2X/week   Barriers to discharge Decreased caregiver support pt from home alone    Co-evaluation PT/OT/SLP Co-Evaluation/Treatment: Yes Reason for Co-Treatment: Complexity of the patient's impairments (multi-system involvement);Necessary to address cognition/behavior during functional activity;For patient/therapist safety;To address functional/ADL transfers PT goals addressed during session: Mobility/safety with mobility;Balance         AM-PAC PT "6 Clicks" Mobility  Outcome Measure Help  needed turning from your back to your side while in a flat bed without using bedrails?: A Lot Help  needed moving from lying on your back to sitting on the side of a flat bed without using bedrails?: A Lot Help needed moving to and from a bed to a chair (including a wheelchair)?: Total Help needed standing up from a chair using your arms (e.g., wheelchair or bedside chair)?: Total Help needed to walk in hospital room?: Total Help needed climbing 3-5 steps with a railing? : Total 6 Click Score: 8    End of Session   Activity Tolerance: Patient tolerated treatment well Patient left: in bed;with call bell/phone within reach;with nursing/sitter in room Nurse Communication: Mobility status (sacral wounds) PT Visit Diagnosis: Muscle weakness (generalized) (M62.81);Other abnormalities of gait and mobility (R26.89);Repeated falls (R29.6);Pain Pain - Right/Left: Left Pain - part of body: Knee    Time: 2122-4825 PT Time Calculation (min) (ACUTE ONLY): 38 min   Charges:   PT Evaluation $PT Eval Moderate Complexity: 1 Mod PT Treatments $Therapeutic Activity: 8-22 mins        West Carbo, PT, DPT   Acute Rehabilitation Department Pager #: [phone]  Sandra Cockayne 08/24/2021, 11:14 AM

## 2021-08-24 NOTE — Progress Notes (Signed)
PHARMACY NOTE:  ANTIMICROBIAL RENAL DOSAGE ADJUSTMENT  Current antimicrobial regimen includes a mismatch between antimicrobial dosage and estimated renal function.  As per policy approved by the Pharmacy & Therapeutics and Medical Executive Committees, the antimicrobial dosage will be adjusted accordingly.  Current antimicrobial dosage:  cefazolin 2g Q12 hr  Indication: MSSA Septic left knee status post bilateral below-knee amputations.  Renal Function:  Estimated Creatinine Clearance: 35.2 mL/min (A) (by C-G formula based on SCr of 1.86 mg/dL (H)).     Antimicrobial dosage has been changed to:  2g Q 8hr   Additional comments: Renal function improving rapidly   Thank you for allowing pharmacy to be a part of this patient's care.  Benetta Spar, PharmD, BCPS, BCCP Clinical Pharmacist  Please check AMION for all [place] phone numbers After 10:00 PM, call [place] [phone]

## 2021-08-25 ENCOUNTER — Encounter (HOSPITAL_COMMUNITY): Payer: Self-pay | Admitting: Orthopedic Surgery

## 2021-08-25 ENCOUNTER — Inpatient Hospital Stay (HOSPITAL_COMMUNITY): Payer: Medicare (Managed Care) | Admitting: Certified Registered Nurse Anesthetist

## 2021-08-25 ENCOUNTER — Encounter (HOSPITAL_COMMUNITY)
Admission: EM | Disposition: A | Discharge status: Skilled Nursing Facility | Payer: Self-pay | Source: Home / Self Care | Attending: Internal Medicine

## 2021-08-25 DIAGNOSIS — L03116 Cellulitis of left lower limb: Secondary | ICD-10-CM

## 2021-08-25 DIAGNOSIS — K264 Chronic or unspecified duodenal ulcer with hemorrhage: Secondary | ICD-10-CM

## 2021-08-25 DIAGNOSIS — K254 Chronic or unspecified gastric ulcer with hemorrhage: Secondary | ICD-10-CM

## 2021-08-25 DIAGNOSIS — M00062 Staphylococcal arthritis, left knee: Secondary | ICD-10-CM | POA: Diagnosis not present

## 2021-08-25 DIAGNOSIS — K209 Esophagitis, unspecified without bleeding: Secondary | ICD-10-CM | POA: Diagnosis not present

## 2021-08-25 DIAGNOSIS — N179 Acute kidney failure, unspecified: Secondary | ICD-10-CM

## 2021-08-25 DIAGNOSIS — K920 Hematemesis: Secondary | ICD-10-CM

## 2021-08-25 DIAGNOSIS — K921 Melena: Secondary | ICD-10-CM | POA: Diagnosis not present

## 2021-08-25 HISTORY — PX: SCLEROTHERAPY: SHX6841

## 2021-08-25 HISTORY — PX: ESOPHAGOGASTRODUODENOSCOPY (EGD) WITH PROPOFOL: SHX5813

## 2021-08-25 HISTORY — PX: BIOPSY: SHX5522

## 2021-08-25 HISTORY — PX: HOT HEMOSTASIS: SHX5433

## 2021-08-25 LAB — PROTIME-INR
INR: 1.4 — ABNORMAL HIGH (ref 0.8–1.2)
Prothrombin Time: 16.8 seconds — ABNORMAL HIGH (ref 11.4–15.2)

## 2021-08-25 LAB — GLUCOSE, CAPILLARY
Glucose-Capillary: 311 mg/dL — ABNORMAL HIGH (ref 70–99)
Glucose-Capillary: 347 mg/dL — ABNORMAL HIGH (ref 70–99)
Glucose-Capillary: 328 mg/dL — ABNORMAL HIGH (ref 70–99)
Glucose-Capillary: 429 mg/dL — ABNORMAL HIGH (ref 70–99)
Glucose-Capillary: 361 mg/dL — ABNORMAL HIGH (ref 70–99)
Glucose-Capillary: 336 mg/dL — ABNORMAL HIGH (ref 70–99)
Glucose-Capillary: 315 mg/dL — ABNORMAL HIGH (ref 70–99)
Glucose-Capillary: 205 mg/dL — ABNORMAL HIGH (ref 70–99)

## 2021-08-25 LAB — RENAL FUNCTION PANEL
Albumin: 1.8 g/dL — ABNORMAL LOW (ref 3.5–5.0)
Anion gap: 12 (ref 5–15)
BUN: 56 mg/dL — ABNORMAL HIGH (ref 8–23)
CO2: 23 mmol/L (ref 22–32)
Calcium: 8.2 mg/dL — ABNORMAL LOW (ref 8.9–10.3)
Chloride: 98 mmol/L (ref 98–111)
Creatinine, Ser: 2 mg/dL — ABNORMAL HIGH (ref 0.61–1.24)
GFR, Estimated: 34 mL/min — ABNORMAL LOW (ref >60)
Glucose, Bld: 350 mg/dL — ABNORMAL HIGH (ref 70–99)
Phosphorus: 2.8 mg/dL (ref 2.5–4.6)
Potassium: 4.1 mmol/L (ref 3.5–5.1)
Sodium: 133 mmol/L — ABNORMAL LOW (ref 135–145)

## 2021-08-25 LAB — CBC
HCT: 25.3 % — ABNORMAL LOW (ref 39.0–52.0)
HCT: 24.2 % — ABNORMAL LOW (ref 39.0–52.0)
Hemoglobin: 8 g/dL — ABNORMAL LOW (ref 13.0–17.0)
Hemoglobin: 8 g/dL — ABNORMAL LOW (ref 13.0–17.0)
MCH: 27.7 pg (ref 26.0–34.0)
MCH: 28.5 pg (ref 26.0–34.0)
MCHC: 31.6 g/dL (ref 30.0–36.0)
MCHC: 33.1 g/dL (ref 30.0–36.0)
MCV: 87.5 fL (ref 80.0–100.0)
MCV: 86.1 fL (ref 80.0–100.0)
Platelets: 307 10*3/uL (ref 150–400)
Platelets: 263 10*3/uL (ref 150–400)
RBC: 2.89 MIL/uL — ABNORMAL LOW (ref 4.22–5.81)
RBC: 2.81 MIL/uL — ABNORMAL LOW (ref 4.22–5.81)
RDW: 17.3 % — ABNORMAL HIGH (ref 11.5–15.5)
RDW: 17.1 % — ABNORMAL HIGH (ref 11.5–15.5)
WBC: 21.1 10*3/uL — ABNORMAL HIGH (ref 4.0–10.5)
WBC: 17.8 10*3/uL — ABNORMAL HIGH (ref 4.0–10.5)
nRBC: 0 % (ref 0.0–0.2)
nRBC: 0 % (ref 0.0–0.2)

## 2021-08-25 LAB — HEPATIC FUNCTION PANEL
ALT: 9 U/L (ref 0–44)
AST: 26 U/L (ref 15–41)
Albumin: 1.8 g/dL — ABNORMAL LOW (ref 3.5–5.0)
Alkaline Phosphatase: 148 U/L — ABNORMAL HIGH (ref 38–126)
Bilirubin, Direct: 0.3 mg/dL — ABNORMAL HIGH (ref 0.0–0.2)
Indirect Bilirubin: 0.6 mg/dL (ref 0.3–0.9)
Total Bilirubin: 0.9 mg/dL (ref 0.3–1.2)
Total Protein: 6.7 g/dL (ref 6.5–8.1)

## 2021-08-25 LAB — PREPARE RBC (CROSSMATCH)

## 2021-08-25 LAB — MAGNESIUM: Magnesium: 2.2 mg/dL (ref 1.7–2.4)

## 2021-08-25 LAB — HEMOGLOBIN AND HEMATOCRIT, BLOOD
HCT: 26.8 % — ABNORMAL LOW (ref 39.0–52.0)
Hemoglobin: 8.9 g/dL — ABNORMAL LOW (ref 13.0–17.0)

## 2021-08-25 SURGERY — ESOPHAGOGASTRODUODENOSCOPY (EGD) WITH PROPOFOL
Anesthesia: General

## 2021-08-25 MED ORDER — PANTOPRAZOLE SODIUM 40 MG IV SOLR
40.0000 mg | Freq: Two times a day (BID) | INTRAVENOUS | Status: AC
  Administered 2021-08-25 – 2021-09-07 (×27): 40 mg via INTRAVENOUS
  Filled 2021-08-25 (×27): qty 40

## 2021-08-25 MED ORDER — LIDOCAINE 2% (20 MG/ML) 5 ML SYRINGE
INTRAMUSCULAR | Status: DC | PRN
  Administered 2021-08-25: 80 mg via INTRAVENOUS

## 2021-08-25 MED ORDER — SUCCINYLCHOLINE CHLORIDE 200 MG/10ML IV SOSY
PREFILLED_SYRINGE | INTRAVENOUS | Status: DC | PRN
  Administered 2021-08-25: 100 mg via INTRAVENOUS

## 2021-08-25 MED ORDER — EPINEPHRINE 1 MG/10ML IJ SOSY
PREFILLED_SYRINGE | INTRAMUSCULAR | Status: AC
  Filled 2021-08-25: qty 10

## 2021-08-25 MED ORDER — ONDANSETRON HCL 4 MG/2ML IJ SOLN
INTRAMUSCULAR | Status: DC | PRN
  Administered 2021-08-25: 4 mg via INTRAVENOUS

## 2021-08-25 MED ORDER — SODIUM CHLORIDE 0.9% IV SOLUTION: Freq: Once | INTRAVENOUS | Status: DC

## 2021-08-25 MED ORDER — PROPOFOL 10 MG/ML IV BOLUS
INTRAVENOUS | Status: DC | PRN
  Administered 2021-08-25: 140 mg via INTRAVENOUS

## 2021-08-25 MED ORDER — SODIUM CHLORIDE 0.9 % IV SOLN: INTRAVENOUS | Status: DC | PRN

## 2021-08-25 MED ORDER — SODIUM CHLORIDE (PF) 0.9 % IJ SOLN
PREFILLED_SYRINGE | INTRAMUSCULAR | Status: DC | PRN
  Administered 2021-08-25: 2.5 mL

## 2021-08-25 MED ORDER — INSULIN ASPART 100 UNIT/ML IJ SOLN
0.0000 [IU] | Freq: Three times a day (TID) | INTRAMUSCULAR | Status: DC
  Administered 2021-08-25 (×2): 11 [IU] via SUBCUTANEOUS
  Administered 2021-08-26 (×2): 3 [IU] via SUBCUTANEOUS
  Administered 2021-08-26 – 2021-08-27 (×3): 5 [IU] via SUBCUTANEOUS
  Administered 2021-08-27: 8 [IU] via SUBCUTANEOUS
  Administered 2021-08-28: 5 [IU] via SUBCUTANEOUS
  Administered 2021-08-28: 8 [IU] via SUBCUTANEOUS
  Administered 2021-08-29: 3 [IU] via SUBCUTANEOUS
  Administered 2021-08-29: 5 [IU] via SUBCUTANEOUS
  Administered 2021-08-29: 2 [IU] via SUBCUTANEOUS

## 2021-08-25 NOTE — Consult Note (Addendum)
[facility] Consult: 9:13 AM 08/25/2021  LOS: 7 days    Referring Provider: Dr Dorian Pod, [facility] int medicine TS Primary Care Physician:  Inc, [facility] Primary Gastroenterologist: Althia Forts.  None.    Reason for Consultation: CGE, dark stool.   HPI: Leslie [name] is a 75 y.o. male.  Hx IDDM.  Hypertension.  COPD.  ASPVD.  S/p bil BKA. Chronic anemia dating back to 2014, blood transfusions 02/2018 when Hgb 6.6 and admitted for amputations.  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  COPD.  Asbestos exposure.  Acute hepatitis many years ago while in jail.  CT chest 2016 with right lung nodules and calcified liver granuloma.  Diabetic neuropathy.  Fatty liver on chest CT 12/2018.  Basal cell skin cancer.  Long history of alcohol abuse, claims abstinence for 1 year. Telephone interaction 2018 with [facility].  Dr. Havery Moros determined direct colonoscopy inappropriate as pt was anemic.  Patient never followed through with in person appointment.  No prior colonoscopy or EGD. Home meds include 81 ASA, Pepcid 20 mg nightly, ferrous sulfate 325 mg qod, Miralax prn, senokot prn, oral B12  Admitted a week ago with delirium/AMS, found down at home, left knee sepsis, cellulitis of residual left limb, oliguric AKI.  Growing MSSA.  Anticipate need for 6 weeks of IV cefazolin.  Patient required insertion of core track feeding tube 10/26 Yesterday evening patient developed dark, coffee like emesis.  Has had 3 or 4 episodes through this morning.  Has had 2 or 3 episodes of melena as well.  Stool  brown that same AM.  Denies current or recent abdominal pain.  Pepcid controls his reflux well.  No dysphagia.  Good appetite at home.  Normally has daily brown stools.  However within the last week had a spell of for 5 days where  he did not have a bowel movement.  Has never seen blood in his stool.  No NSAIDs or ASA other than the low-dose aspirin.  He uses oxycodone prn.     Hgb 9.2 >> 8 (0220 this AM)over 7 days of admission.  1 PRBC transfused starting 0430 this morning.  MCV mid 80s, normal.  Platelets normal.  INR as high as 1.4, currently 1.2.    AKI, GFR as low as 10 currently up to 34.  Not clear what baseline is at this point but normal BUN creatinine in summer 2019.   Na 133. Elevated LFTs with T bili as high as 3.2, alk phos 223, AST/ALT 65/20. Lipase to 73 at admission.  Acute hepatitis serologies all nonreactive.  APAP, salicylate, ETOH levels not elevated Hgb A1c 5.8 but serum glucoses as high as 350 (today). 08/19/2021 abdominal ultrasound with minor GB sludge, no cholecystitis.  Unremarkable liver.  Portal vein Dopplers normal. 08/23/2021 noncontrast CTAP: Colonic diverticulosis.  Prostatomegaly.  Distended urinary bladder with possible mild reflux.  Advanced aortoiliac atherosclerosis.  Degenerative spine disease.  Small calcified liver granuloma otherwise normal liver.  Biliary ducts nondilated, unremarkable gallbladder.  Three-vessel coronary vascular calcification.  His brother died  with colon cancer at age 21.  His mother had "a tumor on her stomach" she died at 27 and patient was 75 years old., wonder if this was not some sort of a pelvic/GYN cancer.  Patient has a history of heavy beer drinking but has not had anything to drink for 2 years.  Past Medical History:  Diagnosis Date   Anxiety    Panic attack - 10  years aago- ? panic    Asthma    Cellulitis of left foot 2016   Healed and recurred 01/2016   Cellulitis of right foot 06/30/2012   Chronic lower back pain 07/01/2012   COPD (chronic obstructive pulmonary disease) ([place])    Uses nebulizers at home. Former smoker, current chewing tobacco. 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   GERD (gastroesophageal reflux disease)    Gouty arthritis 07/01/2012   Not attacks in ~5  yrs (03/2016)   Hepatitis 1967   "in jail when I got it; dr said it was pretty bad; quaranteened X 30d"   Hyperlipidemia    Hypertension    Lung nodule, solitary 07/2015   Due for 1-yr recheck in 07/2017   Peripheral arterial disease ([place])    critical limb ischemia   Peripheral neuropathy 07/01/2012   Seasonal allergies    Skin growth 07/01/2012   right side of nares; "been on there 20 years"   Substance abuse ([place])    Type II diabetes mellitus ([place]) 2002   Diagnosed in 2002    Past Surgical History:  Procedure Laterality Date   AMPUTATION Left 03/28/2016   Procedure: AMPUTATION left foot 4th and 5 th ray;  Surgeon: Newt Minion, MD;  Location: [place];  Service: Orthopedics;  Laterality: Left;   AMPUTATION Left 04/11/2016   Procedure: LEFT BELOW KNEE AMPUTATION;  Surgeon: Newt Minion, MD;  Location: [place];  Service: Orthopedics;  Laterality: Left;   AMPUTATION Right 01/22/2018   Procedure: RIGHT FOOT 3RD RAY AMPUTATION;  Surgeon: Newt Minion, MD;  Location: [place];  Service: Orthopedics;  Laterality: Right;   AMPUTATION Right 02/19/2018   Procedure: RIGHT TRANSMETATARSAL AMPUTATION;  Surgeon: Newt Minion, MD;  Location: [place];  Service: Orthopedics;  Laterality: Right;   AMPUTATION Right 03/28/2018   Procedure: RIGHT BELOW KNEE AMPUTATION;  Surgeon: Newt Minion, MD;  Location: [place];  Service: Orthopedics;  Laterality: Right;   I & D EXTREMITY Left 08/19/2021   Procedure: IRRIGATION AND DEBRIDEMENT LEFT KNEE;  Surgeon: Paralee Cancel, MD;  Location: [place];  Service: Orthopedics;  Laterality: Left;   LEG AMPUTATION BELOW KNEE Left 04/11/2016   LOWER EXTREMITY ANGIOGRAM N/A 04/18/2013   Procedure: LOWER EXTREMITY ANGIOGRAM;  Surgeon: Sherren Mocha, MD;  Location: [facility] CATH LAB;  Service: Cardiovascular;  Laterality: N/A;   LOWER EXTREMITY ANGIOGRAM  08/13/2015   bilateral iliac    PERIPHERAL VASCULAR CATHETERIZATION Bilateral 08/13/2015   Procedure: Lower Extremity Angiography;   Surgeon: Lorretta Harp, MD;  Location: [place] CV LAB;  Service: Cardiovascular;  Laterality: Bilateral;   PERIPHERAL VASCULAR CATHETERIZATION N/A 08/13/2015   Procedure: Abdominal Aortogram;  Surgeon: Lorretta Harp, MD;  Location: [place] CV LAB;  Service: Cardiovascular;  Laterality: N/A;   TOE AMPUTATION Right 01/22/2018   3RD TOE RIGHT FOOT    Prior to Admission medications   Medication Sig Start Date End Date Taking? Authorizing Provider  acetaminophen (TYLENOL) 325 MG tablet Take 2 tablets (650 mg total) by mouth every 6 (six) hours as needed for mild  pain (or Fever >/= 101). Patient taking differently: Take 650 mg by mouth every 8 (eight) hours as needed for mild pain (or Fever >/= 101). 03/30/18  Yes Marcelyn Bruins, MD  albuterol (PROVENTIL HFA;VENTOLIN HFA) 108 (90 Base) MCG/ACT inhaler Inhale 2 puffs into the lungs 4 (four) times daily as needed for wheezing or shortness of breath.    Yes [provider]  albuterol (PROVENTIL) (2.5 MG/3ML) 0.083% nebulizer solution Take 2.5 mg by nebulization every 6 (six) hours as needed for wheezing or shortness of breath.   Yes [provider]  allopurinol (ZYLOPRIM) 100 MG tablet Take 100 mg by mouth at bedtime. For gout   Yes [provider]  amLODipine (NORVASC) 10 MG tablet Take 1 tablet (10 mg total) by mouth daily. 08/09/12  Yes Funches, Adriana Mccallum, MD  aspirin 81 MG chewable tablet Chew 81 mg by mouth daily.   Yes [provider]  calcium carbonate (TUMS EX) 750 MG chewable tablet Chew 1 tablet by mouth 2 (two) times daily as needed for heartburn.   Yes [provider]  carbamazepine (TEGRETOL XR) 200 MG 12 hr tablet Take 200 mg by mouth 2 (two) times daily.   Yes [provider]  Cholecalciferol (VITAMIN D-3) 1000 units CAPS Take 1,000 Units by mouth daily.   Yes [provider]  Evolocumab with Infusor ([place]) 420 MG/3.5ML SOCT Inject 420 mg into  the skin every 30 (thirty) days.   Yes [provider]  famotidine (PEPCID) 20 MG tablet Take 20 mg by mouth at bedtime.   Yes [provider]  ferrous sulfate 325 (65 FE) MG tablet Take 325 mg by mouth every other day.   Yes [provider]  insulin glargine (LANTUS) 100 UNIT/ML injection Inject 75 Units into the skin daily.   Yes [provider]  LORazepam (ATIVAN) 0.5 MG tablet Take 0.25 mg by mouth daily as needed for anxiety (x2 months). 07/30/21 10/22/21 Yes [provider]  losartan-hydrochlorothiazide (HYZAAR) 100-25 MG tablet Take 1 tablet by mouth daily.   Yes [provider]  Melatonin 10 MG TABS Take 10 mg by mouth at bedtime.   Yes [provider]  metFORMIN (GLUCOPHAGE) 850 MG tablet Take 850 mg by mouth 2 (two) times daily with a meal.   Yes [provider]  metoprolol (TOPROL-XL) 200 MG 24 hr tablet Take 200 mg by mouth daily. Take with or immediately following a meal.   Yes [provider]  Multiple Vitamins-Minerals (CERTAVITE SENIOR/ANTIOXIDANT) TABS Take 1 tablet by mouth daily.   Yes [provider]  nitroGLYCERIN (NITROSTAT) 0.4 MG SL tablet Place 0.4 mg under the tongue every 5 (five) minutes x 3 doses as needed for chest pain.  11/12/11  Yes Funches, Josalyn, MD  polyethylene glycol (MIRALAX / GLYCOLAX) 17 g packet Take 17 g by mouth every other day.   Yes [provider]  senna-docusate (SENOKOT-S) 8.6-50 MG tablet Take 2 tablets by mouth daily.   Yes [provider]  Skin Protectants, Misc. (MINERIN) CREA Apply 1 application topically 2 (two) times daily. For dryness   Yes [provider]  traZODone (DESYREL) 50 MG tablet Take 50 mg by mouth at bedtime.   Yes [provider]  vitamin B-12 (CYANOCOBALAMIN) 1000 MCG tablet Take 1,000 mcg by mouth daily.   Yes [provider]  bisacodyl (DULCOLAX) 10 MG suppository Place 10 mg rectally once as  needed (for constipation not relieved by Milk  of Magnesia). Patient not taking: Reported on 08/22/2021    [provider]  carbamazepine (TEGRETOL) 200 MG tablet Take 200-300 mg by mouth See admin instructions. Take 1 tablet (200 mg) by mouth every morning and 1 1/2 tablet (300 mg) every evening Patient not taking: No sig reported    [provider]  clonazePAM (KLONOPIN) 1 MG tablet Take 1 mg by mouth at bedtime.  Patient not taking: Reported on 08/22/2021    [provider]  DULoxetine (CYMBALTA) 60 MG capsule Take 60 mg by mouth daily. Patient not taking: Reported on 08/22/2021    [provider]  furosemide (LASIX) 20 MG tablet Take 20 mg by mouth daily. Patient not taking: Reported on 08/22/2021    [provider]  hydrALAZINE (APRESOLINE) 50 MG tablet Take 50 mg by mouth 3 (three) times daily. Patient not taking: Reported on 08/22/2021    [provider]  insulin glargine (LANTUS) 100 UNIT/ML injection Inject 20 Units into the skin daily.  Patient not taking: No sig reported    [provider]  losartan (COZAAR) 100 MG tablet Take 100 mg by mouth daily. Patient not taking: No sig reported    [provider]  metFORMIN (GLUCOPHAGE) 1000 MG tablet Take 1 tablet (1,000 mg total) by mouth 2 (two) times daily with a meal. Patient not taking: No sig reported 08/16/15   Eileen Stanford, PA-C  mirtazapine (REMERON) 7.5 MG tablet Take 7.5 mg by mouth at bedtime. Patient not taking: Reported on 08/22/2021    [provider]  pentoxifylline (TRENTAL) 400 MG CR tablet Take 1 tablet (400 mg total) by mouth 3 (three) times daily with meals. Patient not taking: Reported on 08/22/2021 02/04/18   Newt Minion, MD  pregabalin (LYRICA) 150 MG capsule Take 150 mg by mouth 3 (three) times daily.  Patient not taking: Reported on 08/22/2021    [provider]  ranitidine (ZANTAC) 300 MG tablet Take 300 mg by mouth  at bedtime. Patient not taking: Reported on 08/22/2021    [provider]  Sodium Phosphates (FLEET ENEMA RE) Place 1 each rectally daily as needed (constipation not relieved by Bisacodyl suppository). Patient not taking: Reported on 08/22/2021    [provider]    Scheduled Meds:  sodium chloride   Intravenous Once   Chlorhexidine Gluconate Cloth  6 each Topical Daily   hydrocerin   Topical BID   insulin aspart  0-15 Units Subcutaneous TID WC   pantoprazole (PROTONIX) IV  40 mg Intravenous Q12H   thiamine  100 mg Oral Daily   Infusions:   ceFAZolin (ANCEF) IV 2 g (08/25/21 0702)   PRN Meds: [DISCONTINUED] acetaminophen **OR** acetaminophen, acetaminophen, albuterol, alum & mag hydroxide-simeth, bisacodyl, senna   Allergies as of 08/18/2021 - Review Complete 08/18/2021  Allergen Reaction Noted   Atorvastatin Other (See Comments) 03/26/2018   Statins Other (See Comments) 03/03/2018   Flexeril [cyclobenzaprine] Other (See Comments) 07/23/2012    Family History  Problem Relation Age of Onset   Diabetes Sister    Hypertension Mother     Social History   Socioeconomic History   Marital status: Widowed    Spouse name: Not on file   Number of children: Not on file   Years of education: Not on file   Highest education level: Not on file  Occupational History   Occupation: disabled  Tobacco Use   Smoking status: Former    Packs/day: 2.00    Years: 40.00  Pack years: 80.00    Types: Cigarettes    Quit date: 01/13/2002    Years since quitting: 19.6   Smokeless tobacco: Current    Types: Chew   Tobacco comments:    chews a little tobacco  Vaping Use   Vaping Use: Never used  Substance and Sexual Activity   Alcohol use: Not Currently    Comment: 03/03/18: "Used to drink alot. 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   Lives in assisted living. Performs ADLs and IADLs, including cooking and managing his medications. Friend drives him to the store. Uses rollator walker at home and motorized scooter in store.   [facility] patient   Widowed   5-8 years education   Likes to fish   Pets:  4 cockatiels and 2 lovebirds   Social Determinants of Radio broadcast assistant Strain: Not on file  Food Insecurity: Not on file  Transportation Needs: Not on file  Physical Activity: Not on file  Stress: Not on file  Social Connections: Not on file  Intimate Partner Violence: Not on file    REVIEW OF SYSTEMS: Constitutional: Patient normally reports no fatigue or weakness.  He walks his little dog regularly using his handicap ramp. ENT:  No nose bleeds Pulm: Denies shortness of breath and cough. CV:  No palpitations, no LE edema.  No angina GU:  No hematuria, no frequency GI: Per HPI. Heme: Denies excessive or unusual bleeding/bruising. Transfusions: See HPI. Neuro:  No headaches, no peripheral tingling or numbness.  No seizures, no syncope. Derm:  No itching, no rash or sores.  Endocrine:  No sweats or chills.  No polyuria or dysuria Immunization: Reviewed.    PHYSICAL EXAM: Vital signs in last 24 hours: Vitals:   08/25/21 0645 08/25/21 0739  BP: 129/68 (!) 161/64  Pulse: 96 94  Resp: 18 16  Temp: 97.6 F (36.4 C) 97.8 F (36.6 C)  SpO2: 98% 99%   Wt Readings from Last 3 Encounters:  08/25/21 86.9 kg  06/21/18 93 kg  04/26/18 97.1 kg    General: Pleasant, overweight, appears stated age.  Comfortable, no distress.  Looks chronically unwell Head: No facial asymmetry or swelling.  No signs of head trauma. Eyes: No scleral icterus or conjunctival pallor.  EOMI Ears: Slightly HOH. Nose: No congestion or discharge Mouth: Mucosa is pink, moist, clear.  Tongue midline.   Edentulous. Neck: No JVD. Lungs: No labored breathing, no cough.  Lungs clear bilaterally though breath sounds a bit distant. Heart: RRR.  No MRG.  S1, S2 present Abdomen: Soft, slightly obese.  Not tender.  Active bowel sounds.  No HSM, masses, bruits, hernias..   Rectal: Deferred. Musc/Skeltl: S/p bilateral BKA, minor erythema in residual L limb Extremities: No CCE. Neurologic: Alert.  Oriented x3.  Moves all 4 limbs without gross deficit but strength not formally tested.  Asterixis present, no tremor. Skin: No rash, no sores, no telangiectasia. Nodes: No cervical adenopathy. Psych: Calm, pleasant, appropriate, cooperative, in good spirits.  Intake/Output from previous day: 10/29 0701 - 10/30 0700 In: 1278 [P.O.:240; I.V.:500; Blood:338; IV Piggyback:200] Out: 1250 [Urine:750; Emesis/NG output:500] Intake/Output this shift: No intake/output data recorded.  LAB RESULTS: Recent Labs  08/23/21 0131 08/24/21 0056 08/25/21 0221  WBC 16.1* 13.9* 21.1*  HGB 9.2* 8.4* 8.0*  HCT 28.1* 26.3* 25.3*  PLT 349 285 307   BMET Lab Results  Component Value Date   NA 133 (L) 08/25/2021   NA 133 (L) 08/24/2021   NA 140 08/24/2021   K 4.1 08/25/2021   K 3.6 08/24/2021   K 3.1 (L) 08/24/2021   CL 98 08/25/2021   CL 99 08/24/2021   CL 105 08/24/2021   CO2 23 08/25/2021   CO2 23 08/24/2021   CO2 25 08/24/2021   GLUCOSE 350 (H) 08/25/2021   GLUCOSE 312 (H) 08/24/2021   GLUCOSE 256 (H) 08/24/2021   BUN 56 (H) 08/25/2021   BUN 47 (H) 08/24/2021   BUN 58 (H) 08/24/2021   CREATININE 2.00 (H) 08/25/2021   CREATININE 1.86 (H) 08/24/2021   CREATININE 2.35 (H) 08/24/2021   CALCIUM 8.2 (L) 08/25/2021   CALCIUM 8.2 (L) 08/24/2021   CALCIUM 8.7 (L) 08/24/2021   LFT Recent Labs    08/23/21 0131 08/24/21 0056 08/25/21 0221  PROT 7.5  --   --   ALBUMIN 2.1*  2.1* 1.9* 1.8*  AST 60*  --   --   ALT 14  --   --   ALKPHOS 223*  --   --   BILITOT 1.7*  --   --   BILIDIR 0.8*  --   --    IBILI 0.9  --   --    PT/INR Lab Results  Component Value Date   INR 1.2 08/23/2021   INR 1.4 (H) 08/19/2021   INR 1.2 08/18/2021   Hepatitis Panel No results for input(s): HEPBSAG, HCVAB, HEPAIGM, HEPBIGM in the last 72 hours. C-Diff No components found for: CDIFF Lipase     Component Value Date/Time   LIPASE 73 (H) 08/22/2021 0136    Drugs of Abuse  No results found for: LABOPIA, COCAINSCRNUR, LABBENZ, AMPHETMU, THCU, LABBARB   RADIOLOGY STUDIES: No results found.    IMPRESSION:      Dark, coffee like emesis and melenic type stools beginning yesterday afternoon/evening.  Takes H2 blocker at home and has no significant reflux disease.  Acute on chronic anemia.  MCV normal.  No recent studies of iron, B12 or folate.  Got 1 PRBC this morning, await follow-up Hgb      Cellulitis of residual left leg.  S/P bilateral BKA     IDDM.  Positive family history colon cancer causing his brother's death in his early 83s.  Patient has never undergone colon colonoscopy  Elevated lipase of 73 at admission.  No evidence for pancreatitis or pancreatic abnormalities on noncontrast CT and abdominal ultrasound.  Elevated LFTs, most notably elevated alk phos.  Again noncontrast CT and abdominal ultrasound pertinent only for minor GB sludge and small, stable calcified liver granuloma, normal bile ducts.  Acute hepatitis panel negative.  Remote hx, pt reported history of acute hepatitis     ASPVD.     PLAN:        EGD.  Will discuss timing with Dr. Candis Schatz.  At some point in near future should also undergo colonoscopy.  See orders for multiple labs to rule out metabolic, autoimmune causes for liver disease as well as to check hepatitis B surface antibody status.    Pt NPO, no solid food > 12 hours.      Azucena Freed  08/25/2021, 9:13 AM Phone [phone]

## 2021-08-25 NOTE — Op Note (Signed)
[facility] Patient Name: Leslie [name] Procedure Date : 08/25/2021 MRN: [id number] Attending MD: Gladstone Pih. Candis Schatz , MD Date of Birth: June 16, 1946 CSN: [id number] Age: 75 Admit Type: Inpatient Procedure:                Upper GI endoscopy Indications:              Melena, coffee ground emesis Providers:                Nicki Reaper E. Candis Schatz, MD, Doristine Johns, RN,                            Jaci Carrel, RN, [facility] Technician,                            Technician Referring MD:              Medicines:                General Anesthesia Complications:            No immediate complications. Estimated Blood Loss:     Estimated blood loss was minimal. Procedure:                Pre-Anesthesia Assessment:                           - Prior to the procedure, a History and Physical                            was performed, and patient medications and                            allergies were reviewed. The patient's tolerance of                            previous anesthesia was also reviewed. The risks                            and benefits of the procedure and the sedation                            options and risks were discussed with the patient.                            All questions were answered, and informed consent                            was obtained. Prior Anticoagulants: The patient has                            taken no previous anticoagulant or antiplatelet                            agents. ASA Grade Assessment: III - A patient with  severe systemic disease. After reviewing the risks                            and benefits, the patient was deemed in                            satisfactory condition to undergo the procedure.                           After obtaining informed consent, the endoscope was                            passed under direct vision. Throughout the                            procedure, the patient's  blood pressure, pulse, and                            oxygen saturations were monitored continuously. The                            [id number]-H190 ([id number]) Olympus endoscope was introduced                            through the mouth, and advanced to the second part                            of duodenum. The upper GI endoscopy was                            accomplished without difficulty. The patient                            tolerated the procedure well. Scope In: Scope Out: Findings:      The examined portions of the nasopharynx, oropharynx and larynx were       normal.      Severe esophagitis with no bleeding was found in the lower third of the       esophagus. There was no identifiable normal appearing mucosa in the mid       and distal esophagus. The mucosal abnormalities were characterized by       black and brown discoloration with some scattered petechia-like lesions.       Biopsies were taken with a cold forceps for histology. Estimated blood       loss was minimal.      Many non-bleeding cratered and superficial gastric ulcers with adherent       clot were found in the gastric body. The largest lesion was 5 mm in       largest dimension. Biopsies were taken with a cold forceps for       histology. Estimated blood loss was minimal.      The exam of the stomach was otherwise normal.      The pylorus was deformed, angulated and congested. Passing into the       duodenal bulb, there was bright red blood present. There was some  resistance traversing the bulb and the second portion of the duodenum       was seen, normal with with some scant bright red blood. The region of       the duodenum just proximal to the C-sweep was stenotic, presumably from       swelling and achieving a stable endoscope position and obtaining good       visualization in this area was very difficult. There was what was       assumed to be a large duodenal ulcer with a visible ulcer and adherent        clot in this area. The mucosa adjacent to the ulcer was injected with       epinephrine (44mL), but no improvement in bleeding or visualization was       achieved. Attempt was made to use bipolar cauterization, but due to the       narrowing in the area, a stable position for hemostasis was not       possible. As the lesion was currently only oozing, and there was       potential for creation of catastrophic bleeding by unroofing the clot       and attempting cauterization in such a difficult location, the decision       was made not to attempt cauterization.      The exam of the duodenum was otherwise normal. Impression:               - The examined portions of the nasopharynx,                            oropharynx and larynx were normal.                           - Severe esophagitis with no bleeding. Biopsied.                           - Non-bleeding gastric ulcers with adherent clot.                            Biopsied.                           - Large duodenal ulcer with adherent clot, visible                            ulcer, active oozing. Due to very difficult                            location secondary to swelling/stenosis, definitive                            hemostasis was not attempted out of concern of                            creating catastrophic bleeding. Recommendation:           - Return patient to hospital ward for ongoing care.                           -  NPO.                           - Use Protonix (pantoprazole) 40 mg IV daily.                           - Use sucralfate suspension 1 gram PO QID.                           - Will discuss with interventional radiology about                            potential embolization, although patient's acute                            renal insufficiency would like preclude angiography                            unless life threatening hemorrhage. Currently the                            patient is hemodynamically stable,  but is high risk                            of major hemorrhage.                           - Await pathology results. Procedure Code(s):        --- Professional ---                           [phone], Esophagogastroduodenoscopy, flexible,                            transoral; with biopsy, single or multiple Diagnosis Code(s):        --- Professional ---                           K20.90, Esophagitis, unspecified without bleeding                           K25.4, Chronic or unspecified gastric ulcer with                            hemorrhage                           K26.4, Chronic or unspecified duodenal ulcer with                            hemorrhage                           K92.1, Melena (includes Hematochezia) CPT copyright 2019 [employer]. All rights reserved. The codes documented in this report are preliminary and upon coder review may  be revised to meet current compliance requirements. Keithen Capo E. Candis Schatz, MD 08/25/2021  1:34:48 PM This report has been signed electronically. Number of Addenda: 0

## 2021-08-25 NOTE — Progress Notes (Signed)
0700: on call MD text paged notifying pt had 200 cc of black coffee ground emesis and episode of black stool. Dr. Gershon Mussel called back and stated he will consult Gi. Notified 1 unit of blood transfused per order. MD was by bedside at shift change. Will continue to monitor.

## 2021-08-25 NOTE — Progress Notes (Addendum)
HD#7 Subjective:  Overnight Events: Coffee ground emesis x 2, dark tarry stools x 1. Give 1 unit of PRBC  Leslie [name] was resting in bed comfortably.  States he had 1 episode of dark vomit this morning as well as one yesterday evening.  He denies ever having this before.  He denies ever having an endoscopy or colonoscopy.  He endorses a long history of alcohol use, drinking 5-6 beers per day but quit a year ago.  He denies excessive ibuprofen or Goody powder use recently.  Does endorse history of acid reflux for which she takes a pill for daily.  Denies feeling lightheaded or dizzy.  Denies abdominal or chest pain.  Objective:  Vital signs in last 24 hours: Vitals:   08/24/21 2352 08/25/21 0359 08/25/21 0418 08/25/21 0455  BP: (!) 156/96 136/65 (!) 147/71 (!) 137/58  Pulse: (!) 104 98 98   Resp: 18 18 18    Temp: 97.8 F (36.6 C) 97.8 F (36.6 C) 97.7 F (36.5 C) 97.6 F (36.4 C)  TempSrc: Oral Oral Oral Oral  SpO2: 98% 98% 98%   Weight:  86.9 kg    Height:       Supplemental O2: Room Air SpO2: 98 % O2 Flow Rate (L/min): 2 L/min   Physical Exam:  Constitutional: Well-appearing, no acute distress HENT: normocephalic atraumatic, rhinophyma Eyes: conjunctiva non-erythematous Neck: supple Cardiovascular: regular rate and rhythm, no m/r/g Pulmonary/Chest: normal work of breathing on room air Abdominal: soft, non-tender, non-distended MSK: normal bulk and tone. Bilateral BKA. Distal left residual limb with minimal erythema, no tenderness Neurological: alert & oriented x 3 GU -Foley catheter in place with dark yellow urine in Foley bag Skin: warm and dry Psych: Normal mood and thought process  Filed Weights   08/22/21 0331 08/24/21 0500 08/25/21 0359  Weight: 87.9 kg 85.5 kg 86.9 kg     Intake/Output Summary (Last 24 hours) at 08/25/2021 0742 Last data filed at 08/25/2021 0500 Gross per 24 hour  Intake 978 ml  Output 1050 ml  Net -72 ml   Net IO Since Admission:  -3,850.58 mL [08/25/21 0742]  Pertinent Labs: CBC Latest Ref Rng & Units 08/25/2021 08/24/2021 08/23/2021  WBC 4.0 - 10.5 K/uL 21.1(H) 13.9(H) 16.1(H)  Hemoglobin 13.0 - 17.0 g/dL 8.0(L) 8.4(L) 9.2(L)  Hematocrit 39.0 - 52.0 % 25.3(L) 26.3(L) 28.1(L)  Platelets 150 - 400 K/uL 307 285 349    CMP Latest Ref Rng & Units 08/25/2021 08/24/2021 08/24/2021  Glucose 70 - 99 mg/dL 350(H) 312(H) 256(H)  BUN 8 - 23 mg/dL 56(H) 47(H) 58(H)  Creatinine 0.61 - 1.24 mg/dL 2.00(H) 1.86(H) 2.35(H)  Sodium 135 - 145 mmol/L 133(L) 133(L) 140  Potassium 3.5 - 5.1 mmol/L 4.1 3.6 3.1(L)  Chloride 98 - 111 mmol/L 98 99 105  CO2 22 - 32 mmol/L 23 23 25   Calcium 8.9 - 10.3 mg/dL 8.2(L) 8.2(L) 8.7(L)  Total Protein 6.5 - 8.1 g/dL - - -  Total Bilirubin 0.3 - 1.2 mg/dL - - -  Alkaline Phos 38 - 126 U/L - - -  AST 15 - 41 U/L - - -  ALT 0 - 44 U/L - - -    Imaging: No results found.  Assessment/Plan:   Principal Problem:   Septic arthritis of knee (HCC) Active Problems:   S/P BKA (below knee amputation) bilateral (HCC)   Acute kidney injury ([place])   Altered mental status   S/P debridement   MSSA (methicillin susceptible Staphylococcus aureus) infection  Cellulitis of left lower extremity   Patient Summary: Leslie [name] is a 75 y.o. with a pertinent PMH of HTN, HLD, T2DM, COPD, bilateral BKAs, alcohol use disorder, PAD, and a remote history of hepatitis, who presented with encephalopathy and admitted for septic arthritis and acute renal failure.    MSSA septic arthritis of left knee s/p I&D Leukocytosis Cellulitis of left residual limb Open Arthrotomy completed 10/24 with large purulent effusion that grew MSSA on cefazolin day 5. Base of left residual limb appears less erythematous and without tenderness today.  Leukocytosis increase from 13.9 to 21.1. Suspect secondary to acute upper GI bleed. Patient will need 6 weeks of IV cefazolin for septic arthritis. -IV cefazolin 2g q 12 hours, day 5,  6 week total course -Dr. Sharol Given evaluated with recommendations for follow-up in his office on discharge -Appreciate Infectious Disease recommendations -Blood and urine cultures pending, no growth after 6 days -trend CBC -Tylenol 650 mg, BID for pain -TOC consult for SNF placement   Hematemesis Melena Patient with 2x episodes hematemesis and 1x melena overnight. Patient with long standing history of alcohol use, quite 1 year ago, no evidence of cirrhosis.  Denies history of GI bleed. Denies excessive NSAID use recently, however on aspirin at home.  Denies history of endoscopy or colonoscopy, none seen in our system. EKG without changes form admission. Hgb dropped from 9 to 8 over past 24 hours. Given 1 unit of PRBC by night team. Started on 40 mg IV Protonix and will plan to consult GI today. Hemodynamically stable at this time.  Hx of iron deficiency anemia, last note from GI from 2018 Dr. Havery Moros, discussing patient needing EGD/colonoscopy however see no documentation of these procedures.  -Will consult GI, appreciate their assistance in his care -s/p 1 unit of PRBC and 500 NS bolus -H&H two hours after transfusion -IV Protonix q12h -2 large bore IV's placed -discontinue aspirin and heparin  AKI on CKD 2/2 ATN vs Prerenal Oliguria-resolved Urinary retention Baseline Cr of 1.5. Cr improved since placement of foley catheter. Elevation in BUN, possibly 2/2 to upper GI bleed. -Nephrology following, appreciate their recommendations -Continue to monitor renal function panel   Type 2 diabetes with peripheral neuropathy Hypoglycemia, improving CBG's steadily rising over past few days, patient tolerating diet well. Will change from sensitive to resistant SSI, however, patient will be NPO until evaluated by GI.  -CBGs q 4 hours -Sliding scale insulin changed to resistant  -NPO at this time 2/2 suspected upper GI bleed -holding carbamazepime due to sedating effect   Alcohol use  disorder Transaminitis Endorses last alcoholic beverage a year ago. hepatic function panel improving. PT/INR pending. No evidence of cirrhosis on imaging.  -ciwa discontinued -Holding sedating medications, including home medications of lyrica and carbamazepine. -thiamine 062 mg -Folic acid 1 mg   EKG changes Prolonged Qtc HFpEF On exam, patient does not appear volume overloaded. Echo showed left ventricular EF of 60 to 65% with no wall motion abnormalities with mild concentric left ventricular hypertrophy. Repeat EKG overnight without acute ischemic changes from prior.  -Telemetry monitoring -holding home medication of Lasix 20 mg until creatinine improves   Normocytic anemia Iron deficiency Trended down to 8.0. Vomiting episode as per above, suspect 2/2 upper gi  bleed.  This appears to be around his baseline. Ganzoni equation calculation of 1019 mg iron deficiency. Patient currently has infective process of left knee joint, will consider iron supplementation once infection resolves. -Monitor CBC   Hypertension Home medications include  Amlodipine 10 mg, Hydralazine 50 mg TID, and Metoprolol 24 hr 200 mg. Blood pressures having improved on amlodipine and hydralazine, however will hold in setting of suspected upper GI bleed.   -Amlodipine 10 mg -Added hydralazine 25 mg TID -Holding Metoprolol and Lasix   Hyperlipidemia Lipid panel showed triglycerides of 208 and LDL not able to be calculated.    COPD Albuterol inhaler at home.   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 -On aspirin 81 mg and pentoxifylline 400 mg 3 times daily at home -Hold aspirin as per above.  Diet: NPO IVF: None,None VTE:  Holding heparin Code:   Dispo: Anticipated discharge to Skilled nursing facility pending further workup and evaluation  Sanjuana Letters DO Internal Medicine Resident PGY-2 Pager [phone] Please contact the on call pager after 5 pm and on weekends at  [phone].

## 2021-08-25 NOTE — Transfer of Care (Signed)
Immediate Anesthesia Transfer of Care Note  Patient: Leslie [name]  Procedure(s) Performed: ESOPHAGOGASTRODUODENOSCOPY (EGD) WITH PROPOFOL SCLEROTHERAPY HOT HEMOSTASIS (ARGON PLASMA COAGULATION/BICAP) BIOPSY  Patient Location: Endoscopy Unit  Anesthesia Type:General  Level of Consciousness: drowsy  Airway & Oxygen Therapy: Patient Spontanous Breathing  Post-op Assessment: Report given to RN and Post -op Vital signs reviewed and stable  Post vital signs: Reviewed and stable  Last Vitals:  Vitals Value Taken Time  BP 128/73 08/25/21 1316  Temp    Pulse 101 08/25/21 1317  Resp 11 08/25/21 1317  SpO2 95 % 08/25/21 1317  Vitals shown include unvalidated device data.  Last Pain:  Vitals:   08/25/21 1202  TempSrc: Temporal  PainSc: 0-No pain      Patients Stated Pain Goal: 0 (20/60/15 6153)  Complications: No notable events documented.

## 2021-08-25 NOTE — Anesthesia Postprocedure Evaluation (Signed)
Anesthesia Post Note  Patient: Leslie [name]  Procedure(s) Performed: ESOPHAGOGASTRODUODENOSCOPY (EGD) WITH PROPOFOL SCLEROTHERAPY HOT HEMOSTASIS (ARGON PLASMA COAGULATION/BICAP) BIOPSY     Patient location during evaluation: PACU Anesthesia Type: General Level of consciousness: awake and alert Pain management: pain level controlled Vital Signs Assessment: post-procedure vital signs reviewed and stable Respiratory status: spontaneous breathing, nonlabored ventilation, respiratory function stable and patient connected to nasal cannula oxygen Cardiovascular status: blood pressure returned to baseline and stable Postop Assessment: no apparent nausea or vomiting Anesthetic complications: no   No notable events documented.  Last Vitals:  Vitals:   08/25/21 1412 08/25/21 1556  BP: (!) 153/61 127/69  Pulse: 94 96  Resp: 15 18  Temp: 36.6 C 36.5 C  SpO2: 95% 100%    Last Pain:  Vitals:   08/25/21 1556  TempSrc: Oral  PainSc: 0-No pain                 Catalina Gravel

## 2021-08-25 NOTE — Anesthesia Preprocedure Evaluation (Signed)
Anesthesia Evaluation  Patient identified by MRN, date of birth, ID band Patient awake    Reviewed: Allergy & Precautions, NPO status , Patient's Chart, lab work & pertinent test results, reviewed documented beta blocker date and time   Airway Mallampati: II  TM Distance: >3 FB Neck ROM: Full    Dental  (+) Dental Advisory Given, Edentulous Lower, Edentulous Upper   Pulmonary asthma , COPD,  COPD inhaler, former smoker,    Pulmonary exam normal breath sounds clear to auscultation       Cardiovascular hypertension, Pt. on medications and Pt. on home beta blockers + Peripheral Vascular Disease  Normal cardiovascular exam Rhythm:Regular Rate:Normal     Neuro/Psych PSYCHIATRIC DISORDERS Anxiety  Neuromuscular disease    GI/Hepatic GERD  Medicated,(+)     substance abuse  alcohol use, Coffee like emesis, melenic stool, acute on chronic anemia.   Endo/Other  diabetes, Type 2, Insulin Dependent, Oral Hypoglycemic AgentsObesity   Renal/GU Renal disease (AIK)     Musculoskeletal  (+) Arthritis ,   Abdominal   Peds  Hematology  (+) Blood dyscrasia, anemia ,   Anesthesia Other Findings Day of surgery medications reviewed with the patient.  Reproductive/Obstetrics                             Anesthesia Physical Anesthesia Plan  ASA: 3  Anesthesia Plan: MAC   Post-op Pain Management:    Induction: Intravenous  PONV Risk Score and Plan: 1 and Propofol infusion and Treatment may vary due to age or medical condition  Airway Management Planned: Nasal Cannula and Natural Airway  Additional Equipment:   Intra-op Plan:   Post-operative Plan:   Informed Consent: I have reviewed the patients History and Physical, chart, labs and discussed the procedure including the risks, benefits and alternatives for the proposed anesthesia with the patient or authorized representative who has indicated  his/her understanding and acceptance.     Dental advisory given  Plan Discussed with: CRNA and Anesthesiologist  Anesthesia Plan Comments:         Anesthesia Quick Evaluation

## 2021-08-25 NOTE — Progress Notes (Signed)
Subjective:  coffee ground emesis and tarry stools overnight.  I/Os yesterday 1.3 / 1.2, UOP 750. BUN/Cr improved from 70/3.6 to 58/2.35 to 56/2.0 since foley placed Thurs PM.   More lucid today.  Objective Vital signs in last 24 hours: Vitals:   08/25/21 0418 08/25/21 0455 08/25/21 0645 08/25/21 0739  BP: (!) 147/71 (!) 137/58 129/68 (!) 161/64  Pulse: 98  96 94  Resp: 18  18 16   Temp: 97.7 F (36.5 C) 97.6 F (36.4 C) 97.6 F (36.4 C) 97.8 F (36.6 C)  TempSrc: Oral Oral Oral Oral  SpO2: 98%  98% 99%  Weight:      Height:       Weight change: 1.4 kg  Intake/Output Summary (Last 24 hours) at 08/25/2021 0932 Last data filed at 08/25/2021 0645 Gross per 24 hour  Intake 1038 ml  Output 1250 ml  Net -212 ml     Assessment/Plan: 75 year old WM admitted with AMS found to have septic L knee and AKI.    AKI on CKD:  Cr was 1.5 in June (reported in the chart) now with severe AKI with Cr initially ~6.  UA ok; suspected ATN related to sepsis. U/S did not initially show obstruction but then developed urinary retention 10/28 with 1.4L urine on foley placement.  Thankfully renal function substantially improved after foley placed.  Cont to monitor daily labs, I/Os, dose meds for CrCl.  Removed fleets from med list. Looking fairly euvolemic and taking po now (until today with the concern for GIB he's NPO for procedure).    2. Hypertension:  BP somewhat labile but acceptable; avoid over treatment 3. Septic knee - s/p I&D OR 10/24.  abx simplified to ancef -  WBC improving but note jumped to 21 today. He's been afebrile with no new localizing source of infection. ID following 4. Anemia  - hgb 8s, GI bleeding reported now; being transfused PRN.  For endoscopy today he tells me.  5. AMS- persistent AMS.  Off all offending meds x ~ a week. Seems improved today.    6. EtOH overuse.  Renal function has improved significantly and he's near baseline Cr 1.5 with good UOP.  Nothing further to add at  this point, call us back if if we can further assist.  I'll arrange for f/u in clinic in 4 weeks. Should see PCP sooner than that.  Justin Mend    Labs: Basic Metabolic Panel: Recent Labs  Lab 08/23/21 0131 08/24/21 0056 08/24/21 1403 08/25/21 0221  NA 140 140 133* 133*  K 3.7 3.1* 3.6 4.1  CL 105 105 99 98  CO2 22 25 23 23   GLUCOSE 244* 256* 312* 350*  BUN 70* 58* 47* 56*  CREATININE 3.57* 2.35* 1.86* 2.00*  CALCIUM 8.8* 8.7* 8.2* 8.2*  PHOS 4.4 3.3  --  2.8    Liver Function Tests: Recent Labs  Lab 08/19/21 0529 08/21/21 0217 08/22/21 0136 08/23/21 0131 08/24/21 0056 08/25/21 0221  AST 43*  --  65* 60*  --   --   ALT 14  --  20 14  --   --   ALKPHOS 192*  --  181* 223*  --   --   BILITOT 2.3*  --  3.2* 1.7*  --   --   PROT 7.1  --  7.5 7.5  --   --   ALBUMIN 2.2*   < > 2.2*  2.2* 2.1*  2.1* 1.9* 1.8*   < > =  values in this interval not displayed.    Recent Labs  Lab 08/22/21 0136  LIPASE 73*    No results for input(s): AMMONIA in the last 168 hours. CBC: Recent Labs  Lab 08/18/21 1736 08/18/21 2323 08/21/21 0217 08/22/21 0136 08/23/21 0131 08/24/21 0056 08/25/21 0221  WBC 27.1*   < > 16.5* 16.8* 16.1* 13.9* 21.1*  NEUTROABS 22.3*  --   --   --   --   --   --   HGB 9.2*   < > 8.9* 9.2* 9.2* 8.4* 8.0*  HCT 28.2*   < > 28.0* 28.6* 28.1* 26.3* 25.3*  MCV 84.7   < > 86.2 85.9 85.4 86.2 87.5  PLT 403*   < > 343 362 349 285 307   < > = values in this interval not displayed.    Cardiac Enzymes: Recent Labs  Lab 08/18/21 1736  CKTOTAL 221    CBG: Recent Labs  Lab 08/24/21 2055 08/25/21 0105 08/25/21 0410 08/25/21 0628 08/25/21 0742  GLUCAP 253* 311* 347* 328* 429*     Iron Studies:  No results for input(s): IRON, TIBC, TRANSFERRIN, FERRITIN in the last 72 hours.  Studies/Results: No results found. Medications: Infusions:   ceFAZolin (ANCEF) IV 2 g (08/25/21 2800)    Scheduled Medications:  sodium chloride   Intravenous  Once   Chlorhexidine Gluconate Cloth  6 each Topical Daily   hydrocerin   Topical BID   insulin aspart  0-15 Units Subcutaneous TID WC   pantoprazole (PROTONIX) IV  40 mg Intravenous Q12H   thiamine  100 mg Oral Daily    have reviewed scheduled and prn medications.  Physical Exam: General:  awake and interactive Heart: RRR Lungs: normal WOB on RA Abdomen: obese, soft Extremities:  bilat BKA-  post op from knee I and D, no erythema Neuro: awake and alert, fully oriented today and knows detail of medical issues today   08/25/2021,9:32 AM  LOS: 7 days

## 2021-08-25 NOTE — Plan of Care (Signed)

## 2021-08-25 NOTE — Anesthesia Procedure Notes (Signed)
Procedure Name: Intubation Date/Time: 08/25/2021 12:31 PM Performed by: Clearnce Sorrel, CRNA Pre-anesthesia Checklist: Patient identified, Emergency Drugs available, Suction available, Patient being monitored and Timeout performed Patient Re-evaluated:Patient Re-evaluated prior to induction Oxygen Delivery Method: Circle system utilized Preoxygenation: Pre-oxygenation with 100% oxygen Induction Type: Rapid sequence, Cricoid Pressure applied and IV induction Laryngoscope Size: McGraph and 4 Grade View: Grade I Tube type: Oral Tube size: 7.5 mm Number of attempts: 1 Airway Equipment and Method: Stylet Placement Confirmation: ETT inserted through vocal cords under direct vision, CO2 detector and breath sounds checked- equal and bilateral Secured at: 23 cm Tube secured with: Tape Dental Injury: Teeth and Oropharynx as per pre-operative assessment

## 2021-08-25 NOTE — Progress Notes (Signed)
2146: notified DR. Steen that pt had dark brown 300 cc of emesis at bedside shift change report. MD was aware.12 ekg completed per order.  2158: after taking potasium supplement , pt complained of nausea, started sweating and said he could not tolerate it and feels sick to the stomach. Notified dr. Court Joy and asked if pt could have some Zofran.  2225: MD was on bedside.  2355: 500 cc bolus completed per order. Pt was still profusely sweating and was shaky. Stated he has real bad reflux and could still taste that potassium juice . Prn Maalox administered. MD updated on pt's condition. See Mar for blood transfusion orders.  Little better after Maalox and bolus. Resting in bed. Will continue to monitor.

## 2021-08-26 ENCOUNTER — Inpatient Hospital Stay: Payer: Self-pay

## 2021-08-26 ENCOUNTER — Encounter (HOSPITAL_COMMUNITY): Payer: Self-pay | Admitting: Gastroenterology

## 2021-08-26 DIAGNOSIS — Z9889 Other specified postprocedural states: Secondary | ICD-10-CM | POA: Diagnosis not present

## 2021-08-26 DIAGNOSIS — L8990 Pressure ulcer of unspecified site, unspecified stage: Secondary | ICD-10-CM | POA: Insufficient documentation

## 2021-08-26 DIAGNOSIS — N179 Acute kidney failure, unspecified: Secondary | ICD-10-CM | POA: Diagnosis not present

## 2021-08-26 DIAGNOSIS — M00062 Staphylococcal arthritis, left knee: Secondary | ICD-10-CM | POA: Diagnosis not present

## 2021-08-26 DIAGNOSIS — R4182 Altered mental status, unspecified: Secondary | ICD-10-CM | POA: Diagnosis not present

## 2021-08-26 DIAGNOSIS — A4901 Methicillin susceptible Staphylococcus aureus infection, unspecified site: Secondary | ICD-10-CM | POA: Diagnosis not present

## 2021-08-26 LAB — RENAL FUNCTION PANEL
Albumin: 1.9 g/dL — ABNORMAL LOW (ref 3.5–5.0)
Anion gap: 11 (ref 5–15)
BUN: 67 mg/dL — ABNORMAL HIGH (ref 8–23)
CO2: 26 mmol/L (ref 22–32)
Calcium: 8 mg/dL — ABNORMAL LOW (ref 8.9–10.3)
Chloride: 96 mmol/L — ABNORMAL LOW (ref 98–111)
Creatinine, Ser: 2.08 mg/dL — ABNORMAL HIGH (ref 0.61–1.24)
GFR, Estimated: 33 mL/min — ABNORMAL LOW (ref >60)
Glucose, Bld: 203 mg/dL — ABNORMAL HIGH (ref 70–99)
Phosphorus: 2.6 mg/dL (ref 2.5–4.6)
Potassium: 3.3 mmol/L — ABNORMAL LOW (ref 3.5–5.1)
Sodium: 133 mmol/L — ABNORMAL LOW (ref 135–145)

## 2021-08-26 LAB — CBC WITH DIFFERENTIAL/PLATELET
Abs Immature Granulocytes: 0.22 10*3/uL — ABNORMAL HIGH (ref 0.00–0.07)
Basophils Absolute: 0.1 10*3/uL (ref 0.0–0.1)
Basophils Relative: 0 %
Eosinophils Absolute: 0.1 10*3/uL (ref 0.0–0.5)
Eosinophils Relative: 1 %
HCT: 24.7 % — ABNORMAL LOW (ref 39.0–52.0)
Hemoglobin: 8.1 g/dL — ABNORMAL LOW (ref 13.0–17.0)
Immature Granulocytes: 1 %
Lymphocytes Relative: 12 %
Lymphs Abs: 2.2 10*3/uL (ref 0.7–4.0)
MCH: 28.7 pg (ref 26.0–34.0)
MCHC: 32.8 g/dL (ref 30.0–36.0)
MCV: 87.6 fL (ref 80.0–100.0)
Monocytes Absolute: 1 10*3/uL (ref 0.1–1.0)
Monocytes Relative: 5 %
Neutro Abs: 15 10*3/uL — ABNORMAL HIGH (ref 1.7–7.7)
Neutrophils Relative %: 81 %
Platelets: 286 10*3/uL (ref 150–400)
RBC: 2.82 MIL/uL — ABNORMAL LOW (ref 4.22–5.81)
RDW: 17.2 % — ABNORMAL HIGH (ref 11.5–15.5)
WBC: 18.5 10*3/uL — ABNORMAL HIGH (ref 4.0–10.5)
nRBC: 0 % (ref 0.0–0.2)

## 2021-08-26 LAB — GLUCOSE, CAPILLARY
Glucose-Capillary: 241 mg/dL — ABNORMAL HIGH (ref 70–99)
Glucose-Capillary: 228 mg/dL — ABNORMAL HIGH (ref 70–99)
Glucose-Capillary: 168 mg/dL — ABNORMAL HIGH (ref 70–99)
Glucose-Capillary: 166 mg/dL — ABNORMAL HIGH (ref 70–99)
Glucose-Capillary: 256 mg/dL — ABNORMAL HIGH (ref 70–99)

## 2021-08-26 LAB — HEPATITIS B SURFACE ANTIBODY,QUALITATIVE: Hep B S Ab: REACTIVE — AB

## 2021-08-26 MED ORDER — POTASSIUM CHLORIDE 20 MEQ PO PACK
20.0000 meq | PACK | Freq: Two times a day (BID) | ORAL | Status: AC
  Administered 2021-08-26 – 2021-08-27 (×2): 20 meq via ORAL
  Filled 2021-08-26 (×2): qty 1

## 2021-08-26 MED ORDER — POTASSIUM CHLORIDE 20 MEQ PO PACK: 20.0000 meq | PACK | Freq: Two times a day (BID) | ORAL | Status: DC

## 2021-08-26 MED ORDER — SUCRALFATE 1 G PO TABS
1.0000 g | ORAL_TABLET | Freq: Three times a day (TID) | ORAL | Status: DC
  Administered 2021-08-26 – 2021-09-13 (×63): 1 g via ORAL
  Filled 2021-08-26 (×62): qty 1

## 2021-08-26 MED ORDER — LACTATED RINGERS IV BOLUS
500.0000 mL | Freq: Once | INTRAVENOUS | Status: AC
  Administered 2021-08-26: 500 mL via INTRAVENOUS

## 2021-08-26 MED ORDER — POTASSIUM CHLORIDE 10 MEQ/100ML IV SOLN
10.0000 meq | INTRAVENOUS | Status: AC
  Administered 2021-08-26 (×3): 10 meq via INTRAVENOUS
  Filled 2021-08-26 (×3): qty 100

## 2021-08-26 NOTE — Progress Notes (Signed)
Inpatient Diabetes Program Recommendations  AACE/ADA: New Consensus Statement on Inpatient Glycemic Control   Target Ranges:  Prepandial:   less than 140 mg/dL      Peak postprandial:   less than 180 mg/dL (1-2 hours)      Critically ill patients:  140 - 180 mg/dL   Results for Leslie [name], Leslie [name] (MRN [id number]) as of 08/26/2021 11:26  Ref. Range 08/25/2021 07:42 08/25/2021 12:09 08/25/2021 13:20 08/25/2021 15:58 08/25/2021 20:48 08/26/2021 07:01 08/26/2021 08:11  Glucose-Capillary Latest Ref Range: 70 - 99 mg/dL 429 (H) 361 (H) 336 (H) 315 (H) 205 (H) 241 (H) 228 (H)   Review of Glycemic Control  Diabetes history: DM2 Outpatient Diabetes medications: Lantus 75 units daily, Metformin 850 mg BID Current orders for Inpatient glycemic control: Novolog 0-15 units TID with meals  Inpatient Diabetes Program Recommendations:    Insulin: Please consider ordering Semglee 13 units Q24H (based on 87 kg x 0.15 units) and once diet is resumed consider ordering Novolog 3 units TID with meals for meal coverage if patient eats at least 50% of meals.  Thanks, Barnie Alderman, RN, MSN, CDE Diabetes Coordinator Inpatient Diabetes Program [phone] (Team Pager from 8am to 5pm)

## 2021-08-26 NOTE — TOC Initial Note (Signed)
Transition of Care [facility]) - Initial/Assessment Note    Patient Details  Name: Leslie [name] MRN: [id number] Date of Birth: Dec 27, 1945  Transition of Care [facility]) CM/SW Contact:    Vinie Sill, LCSW Phone Number: 08/26/2021, 3:35 PM  Clinical Narrative:                  CSW met with patient at bedside. CSW introduced self and explained role. CSW discuss with patient therapy recommendations for short term rehab at [facility]. Patient states he lives alone. He is agrees with recommendation and wants [facility] as first choice. CSW explained the SNF process w/ [facility]. Patient states understanding. Patient has received covid vaccine but no booster.   [facility] SW is Marcie Bal # [phone]. [facility] agrees with recommendation.  CSW will provide bed offers once available  CSW will continue to follow and assist with discharge planning.  Thurmond Butts, MSW, LCSW Clinical Social Worker    Expected Discharge Plan: Skilled Nursing Facility Barriers to Discharge: Continued Medical Work up, SNF Pending bed offer   Patient Goals and CMS Choice        Expected Discharge Plan and Services Expected Discharge Plan: [place] arrangements for the past 2 months: Single Family Home                                      Prior Living Arrangements/Services Living arrangements for the past 2 months: Single Family Home Lives with:: Self Patient language and need for interpreter reviewed:: No        Need for Family Participation in Patient Care: Yes (Comment) Care giver support system in place?: Yes (comment)   Criminal Activity/Legal Involvement Pertinent to Current Situation/Hospitalization: No - Comment as needed  Activities of Daily Living      Permission Sought/Granted Permission sought to share information with : Family Supports Permission granted to share information with : Yes, Verbal Permission Granted  Share Information with NAME: [name]  Permission granted to share info w AGENCY: SNFs  Permission granted to share info w Relationship: sister  Permission granted to share info w Contact Information: [phone]  Emotional Assessment Appearance:: Appears stated age Attitude/Demeanor/Rapport: Engaged Affect (typically observed): Pleasant, Appropriate Orientation: : Oriented to Self, Oriented to Place, Oriented to  Time, Oriented to Situation Alcohol / Substance Use: Not Applicable Psych Involvement: No (comment)  Admission diagnosis:  Hyponatremia [E87.1] Altered mental status [R41.82] Hypoglycemia [E16.2] Cellulitis of left lower extremity [L03.116] Diagnosis unknown [R69] Acute kidney injury ([place]) [N17.9] Acute renal failure, unspecified acute renal failure type ([place]) [N17.9] Altered mental status, unspecified altered mental status type [R41.82] S/P debridement [Z98.890] Patient Active Problem List   Diagnosis Date Noted   Pressure injury of skin 08/26/2021   Cellulitis of left lower extremity    Septic arthritis of knee ([place]) 08/21/2021   MSSA (methicillin susceptible Staphylococcus aureus) infection 08/21/2021   S/P debridement 08/19/2021   Altered mental status 08/18/2021   Bacteremia    Acute respiratory distress    Acute kidney injury ([place])    Sepsis ([place])    Symptomatic anemia    Severe protein-calorie malnutrition ([place])    Wound infection    Dehiscence of amputation stump ([place])    Osteomyelitis ([place]) 03/26/2018   Cellulitis of foot without toes, right 03/03/2018   History of transmetatarsal amputation of right foot ([place]) 02/19/2018  Amputated toe of right foot ([place]) 01/22/2018   Gangrene of right foot ([place])    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 ([place]) 01/14/2018   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 ([place]) 01/14/2018   S/P BKA (below knee amputation) bilateral ([place]) 04/11/2016   History of tobacco  abuse 03/31/2016   Osteomyelitis of left foot ([place])    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   Septic arthritis of left foot ([place]) 03/27/2016   Solitary pulmonary nodule 08/14/2015   Precordial chest pain 08/13/2015   Peripheral arterial disease ([place]) 04/18/2015   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   Right anterior knee pain 11/19/2012   Toenail deformity 01/16/2012   Gout    Diabetic neuropathy ([place]) 03/31/2008   ALCOHOLISM 03/31/2008   Rosacea 03/31/2008   GERD 01/31/2008   COPD (chronic obstructive pulmonary disease) ([place]) 74/82/7078   DM W/COMPLICATION NOS, TYPE II 05/08/2007   OBESITY 05/08/2007   HTN (hypertension) 05/08/2007   HERNIATED [place] 05/08/2007   HISTORY OF ASBESTOS EXPOSURE 05/08/2007   Chronic lower back pain on narcotics  06/16/2005   HLD (hyperlipidemia) 03/22/2002   PCP:  Inc, [place]:  No Pharmacies Listed    Social Determinants of Health (SDOH) Interventions    Readmission Risk Interventions No flowsheet data found.

## 2021-08-26 NOTE — Hospital Course (Addendum)
11/5- more responsive and alert. Able to properly answer questions. Feeling better with decreased pain in his left leg. No cp, sob, dizziness, abd pain, fevers, chills. Did not use O2 last night. Hungry for breakfast. HR in the 90s, 140/68, 98 sats, 16 rr  A&Ox3 Leg wrapped in ace bandage.   11/11 Feeling alright today. Says he would rather go home than go to [place]. Leg feels better today. Some pain in his leg but says that there will always be a little pain in it. Last time he worked with PT was a couple of days ago, but all they want to do is have him practice putting his boot on which he says that he can already do by himself. Had a brown bowel movement last night.   11/12 Feeling arlight this morning. No pain in his leg. Does not have any concerns. Does not remember if he talked to the people from [facility] yesterday  #Acute Encephalopathy  Patient presented on 08/18/21 due to altered mental status.  His sister stated that she had not been able to reach him for the last 2 days and asked the landlord to open the door for her.  On presentation to ED, patient was not able to participate in interview and A&O x1.  In the ED initial labs showed leukocytosis 27.1, hemoglobin 9.2, platelets of 403, lactic acid 2.7, BUN/ CR 77/5.68 with baseline of 1.5 in 03/2021.  Acute encephalopathy thought to be multifactorial due to to sedating medications including Klonopin, pain medications, and carbamazepine as well as sepsis secondary to left knee septic arthritis and uremia due to acute kidney injury.  Even though all medications were held by internal medicine team, following I&D with orthopedics on 10/24 sedating medications were given due to orthopedic surgeon restarting home medications.  Following day patient remained altered, but gradually improved with time and resolution of sepsis and uremia.  #Acute kidney injury #Acute Tubular Necrosis #Urinary Retention BUN/creatinine 77/5.68 with baseline of 1.5 in  June 2022.  AKI thought to be secondary to acute tubular necrosis due to to sepsis and hypertensive medications.  Creatinine and BUN were improving until 10/28.  CT abdomen was ordered due to abdominal pain and that showed that he had a distended bladder.  Foley catheter placed 10/29 with 1.3 L output.  Creatinine and BUN again began to improve.  Leslie [name] had a significant GI bleed on October 29 and was kept n.p.o. for endoscopy on October 30.  Following this his creatinine again began to trend upwards.   #Left Knee septic arthritis #MSSA septic arthritis of left knee s/p I&D #Leukocytosis #Cellulitis of left residual limb On initial evaluation in the ED, left knee and residual limb appeared to be erythematous, tender, and swollen.  CBC significant for leukocytosis of 27.1.  Patient was started on empiric treatment with Vancomycin and cefepime..  On chart review telephone documentation to Dr. Jess Barters office indicated that patient had had pain in this leg for the last few days.  The PDX consulted and performed an arthrocentesis with 1 mL purulent discharge.  Patient was then taken to the OR on October 24 for open arthroscopy with irrigation and drainage.  ID was consulted.  Synovial fluid from left knee grew MSSA and antibiotics were de-escalated to IV cefazolin 2 g every 8 hours.  Per ID patient will need to receive 6 weeks of IV antibiotics.  Vancomycin and cefepime initiated on October 23.  Cefazolin was initiated on October 26.   #GI hemorrhage  due to duodenal ulcer #Iron deficiency anemia On the evening of October 29, patient began to have multiple episodes of hematemesis and melena.  Vitals checked and tachycardia trending up from 10 1-1 11 this afternoon.  EKG checked showing sinus tachycardia with prolonged QT.  Tachycardia did not improve with bolus and patient was transfused 1 unit of packed red blood cells.  Endoscopy was completed on October 30 and showed large duodenal ulcer that is stable at  the moment.  The clot was left undisturbed during endoscopy to avoid catastrophic bleeding from dislodgment.     #Hepatitis B surface antibody #Transaminitis #Hypoalbuminemia #History of alcohol use disorder  #Hypokalemia  #Hyponatremia  Chronic problems:  #PAD s/p bilateral BKAs  #Hyperlipidemia  #HFpEF  #COPD  #Type 2 diabetes with peripheral neuropathy #Hypoglycemia on admission- resolved

## 2021-08-26 NOTE — Progress Notes (Signed)
Brief GI Progress Note: Per discussion with Dr. Candis Schatz and review of the EGD report, there are no additional endoscopic therapies that can be offered for the duodenal ulcer. Please reach out to IR if the patient has recurrent episodes of bleeding. GI will sign off. We will arrange for clinic follow up with Dr. Candis Schatz as an outpatient. Please call if any new questions arise.

## 2021-08-26 NOTE — Care Management Important Message (Signed)
Important Message  Patient Details  Name: Leslie [name] MRN: [id number] Date of Birth: [birth year]   Medicare Important Message Given:  Yes     Shelda Altes 08/26/2021, 11:21 AM

## 2021-08-26 NOTE — NC FL2 (Signed)
[place] LEVEL OF CARE SCREENING TOOL     IDENTIFICATION  Patient Name: Leslie [name] Birthdate: [birth year] Sex: male Admission Date (Current Location): 08/18/2021  [facility] and [place] Number:  Herbalist and Address:  [facility] [place] [address], [place], [place] [address]      Provider Number: [id number]  Attending Physician Name and Address:  Angelica Pou, MD  Relative Name and Phone Number:       Current Level of Care: Hospital Recommended Level of Care: [place] Prior Approval Number:    Date Approved/Denied:   PASRR Number: [id number]  Discharge Plan: SNF    Current Diagnoses: Patient Active Problem List   Diagnosis Date Noted   Pressure injury of skin 08/26/2021   Cellulitis of left lower extremity    Septic arthritis of knee ([place]) 08/21/2021   MSSA (methicillin susceptible Staphylococcus aureus) infection 08/21/2021   S/P debridement 08/19/2021   Altered mental status 08/18/2021   Bacteremia    Acute respiratory distress    Acute kidney injury ([place])    Sepsis ([place])    Symptomatic anemia    Severe protein-calorie malnutrition ([place])    Wound infection    Dehiscence of amputation stump ([place])    Osteomyelitis ([place]) 03/26/2018   Cellulitis of foot without toes, right 03/03/2018   History of transmetatarsal amputation of right foot ([place]) 02/19/2018   Amputated toe of right foot ([place]) 01/22/2018   Gangrene of right foot ([place])    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 ([place]) 01/14/2018   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 ([place]) 01/14/2018   S/P BKA (below knee amputation) bilateral ([place]) 04/11/2016   History of tobacco abuse 03/31/2016   Osteomyelitis of left foot ([place])    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   Septic arthritis of left foot ([place]) 03/27/2016   Solitary pulmonary nodule  08/14/2015   Precordial chest pain 08/13/2015   Peripheral arterial disease ([place]) 04/18/2015   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   Right anterior knee pain 11/19/2012   Toenail deformity 01/16/2012   Gout    Diabetic neuropathy ([place]) 03/31/2008   ALCOHOLISM 03/31/2008   Rosacea 03/31/2008   GERD 01/31/2008   COPD (chronic obstructive pulmonary disease) ([place]) 17/79/3903   DM W/COMPLICATION NOS, TYPE II 05/08/2007   OBESITY 05/08/2007   HTN (hypertension) 05/08/2007   HERNIATED [place]C 05/08/2007   HISTORY OF ASBESTOS EXPOSURE 05/08/2007   Chronic lower back pain on narcotics  06/16/2005   HLD (hyperlipidemia) 03/22/2002    Orientation RESPIRATION BLADDER Height & Weight     Self, Time, Situation, Place  Normal Incontinent, External catheter Weight: 191 lb 12.8 oz (87 kg) Height:  5\' 6"  (167.6 cm)  BEHAVIORAL SYMPTOMS/MOOD NEUROLOGICAL BOWEL NUTRITION STATUS      Incontinent Diet (please see discharge summary)  AMBULATORY STATUS COMMUNICATION OF NEEDS Skin   Limited Assist Verbally Surgical wounds (pressure injury [place] Mid Stg II, Closed incision Left Leg)                       Personal Care Assistance Level of Assistance  Bathing, Dressing, Feeding Bathing Assistance: Limited assistance Feeding assistance: Independent Dressing Assistance: Limited assistance     Functional Limitations Info  Sight, Hearing, Speech Sight Info: Adequate Hearing Info: Adequate Speech Info: Adequate    SPECIAL CARE FACTORS FREQUENCY  PT (By licensed PT), OT (By licensed OT)  PT Frequency: 5x per week OT Frequency: 5x per week            Contractures Contractures Info: Not present    Additional Factors Info  Code Status, Allergies Code Status Info: FULL Allergies Info: Atorvastatin,Statins,Flexeril           Current Medications (08/26/2021):  This is the current hospital active medication list Current Facility-Administered Medications  Medication Dose Route Frequency Provider  Last Rate Last Admin   0.9 %  sodium chloride infusion (Manually program via Guardrails IV Fluids)   Intravenous Once Madalyn Rob, MD       acetaminophen (TYLENOL) suppository 650 mg  650 mg Rectal Q6H PRN Paralee Cancel, MD       acetaminophen (TYLENOL) tablet 650 mg  650 mg Oral Q6H PRN Atway, Rayann N, DO   650 mg at 08/26/21 0122   albuterol (PROVENTIL) (2.5 MG/3ML) 0.083% nebulizer solution 2.5 mg  2.5 mg Nebulization Q6H PRN Paralee Cancel, MD       alum & mag hydroxide-simeth (MAALOX/MYLANTA) 200-200-20 MG/5ML suspension 30 mL  30 mL Oral Q4H PRN Gilles Chiquito B, MD   30 mL at 08/25/21 0705   bisacodyl (DULCOLAX) suppository 10 mg  10 mg Rectal Once PRN Paralee Cancel, MD   10 mg at 08/23/21 0147   ceFAZolin (ANCEF) IVPB 2g/100 mL premix  2 g Intravenous Q8H Donnamae Jude, RPH 200 mL/hr at 08/26/21 1308 2 g at 08/26/21 1308   Chlorhexidine Gluconate Cloth 2 % PADS 6 each  6 each Topical Daily Charise Killian, MD   6 each at 08/26/21 2458   hydrocerin (EUCERIN) cream   Topical BID Paralee Cancel, MD   Given at 08/26/21 [id number]   insulin aspart (novoLOG) injection 0-15 Units  0-15 Units Subcutaneous TID WC Katsadouros, Vasilios, MD   3 Units at 08/26/21 1256   pantoprazole (PROTONIX) injection 40 mg  40 mg Intravenous Q12H Katsadouros, Vasilios, MD   40 mg at 08/26/21 3382   senna (SENOKOT) tablet 8.6 mg  1 tablet Oral Daily PRN Masters, Katie, DO       sucralfate (CARAFATE) tablet 1 g  1 g Oral TID WC & HS Lacinda Axon, MD   1 g at 08/26/21 1256   thiamine tablet 100 mg  100 mg Oral Daily Masters, Katie, DO   100 mg at 08/26/21 1256     Discharge Medications: Please see discharge summary for a list of discharge medications.  Relevant Imaging Results:  Relevant Lab Results:   Additional Information SSN [id number]  patient states he has received covid vaccine but no booster shot  Vinie Sill, LCSW

## 2021-08-26 NOTE — Progress Notes (Addendum)
HD#8 Subjective:  Overnight Events: No overnight events, no further episodes of emesis. He had one episode of dark tarry stool last night.  Patient seen at bedside this AM. He states that he is hungry and ready to go home.  We discussed that with his GI bleed, he would need to be monitored for a few days and that he would also likely need to go to a rehabilitation place for continued IV antibiotic.    Objective:  Vital signs in last 24 hours: Vitals:   08/25/21 2127 08/26/21 0005 08/26/21 0009 08/26/21 0545  BP:  (!) 112/47 (!) 159/73 (!) 173/87  Pulse:  92 92 98  Resp:  19 19 18   Temp:   98.3 F (36.8 C) 98.4 F (36.9 C)  TempSrc: Oral Oral Oral Oral  SpO2:  97% 96% 95%  Weight:    87 kg  Height:       Supplemental O2: Room Air SpO2: 95 % O2 Flow Rate (L/min): 2 L/min   Physical Exam:  General: well-appearing, in no acute distress HENT: NCAT, rhinophyma Eyes: conjunctiva clear, no scleral icterus CV: regular rate and rhythm, no murmurs Pulm: CTAB, normal work of breathing GI: no tenderness, bowel sounds present MSK: bilateral BKA, distal left residual limb with dressing in place 5 cm x 5 cm area of erythema and swelling on the medial side of knee, below patella. Skin: warm and dry Psych: normal mood and affect, concerned about going home  [facility] Weights   08/24/21 0500 08/25/21 0359 08/26/21 0545  Weight: 85.5 kg 86.9 kg 87 kg     Intake/Output Summary (Last 24 hours) at 08/26/2021 0636 Last data filed at 08/26/2021 0601 Gross per 24 hour  Intake 1246.81 ml  Output 930 ml  Net 316.81 ml    Net IO Since Admission: -3,533.77 mL [08/26/21 0636]  Pertinent Labs: CBC Latest Ref Rng & Units 08/26/2021 08/25/2021 08/25/2021  WBC 4.0 - 10.5 K/uL 18.5(H) 17.8(H) -  Hemoglobin 13.0 - 17.0 g/dL 8.1(L) 8.0(L) 8.9(L)  Hematocrit 39.0 - 52.0 % 24.7(L) 24.2(L) 26.8(L)  Platelets 150 - 400 K/uL 286 263 -    CMP Latest Ref Rng & Units 08/25/2021 08/24/2021  08/24/2021  Glucose 70 - 99 mg/dL 350(H) 312(H) 256(H)  BUN 8 - 23 mg/dL 56(H) 47(H) 58(H)  Creatinine 0.61 - 1.24 mg/dL 2.00(H) 1.86(H) 2.35(H)  Sodium 135 - 145 mmol/L 133(L) 133(L) 140  Potassium 3.5 - 5.1 mmol/L 4.1 3.6 3.1(L)  Chloride 98 - 111 mmol/L 98 99 105  CO2 22 - 32 mmol/L 23 23 25   Calcium 8.9 - 10.3 mg/dL 8.2(L) 8.2(L) 8.7(L)  Total Protein 6.5 - 8.1 g/dL 6.7 - -  Total Bilirubin 0.3 - 1.2 mg/dL 0.9 - -  Alkaline Phos 38 - 126 U/L 148(H) - -  AST 15 - 41 U/L 26 - -  ALT 0 - 44 U/L 9 - -    Imaging: No results found.  Assessment/Plan:   Principal Problem:   Septic arthritis of knee (HCC) Active Problems:   S/P BKA (below knee amputation) bilateral (HCC)   Acute kidney injury ([place])   Altered mental status   S/P debridement   MSSA (methicillin susceptible Staphylococcus aureus) infection   Cellulitis of left lower extremity   Pressure injury of skin   Patient Summary: Leslie [name] is a 75 y.o. with a pertinent PMH of HTN, HLD, T2DM, COPD, bilateral BKAs, alcohol use disorder, PAD, and a remote history of hepatitis, who presented  with encephalopathy and admitted for septic arthritis and acute renal failure on hospital day 8.   Hematemesis Melena Patient with multiple episodes of hematemesis and melena overnight 10/29, he was transfused one unit RBCs.  Endoscopy completed 08/25/21 showed severe esophagitis without active bleeding, non-bleeding gastric ulcer with adherent clot, large duodenal ulcer with adherent clot, active oozing.  Due to difficult location secondary to swelling/stenosis, definitive hemostasis was not attempted out of concern of creating catastrophic bleeding.  Ideally would consult Interventional Radiology for potential embolization, however in the setting of acute kidney injury, contrast could offer more risk than benefit. Post H&H at 8.9 to 8.1 today.   -Trend CBC -Advance diet as tolerated to carb modified -Protonix 40 mg IV daily -no  further plans for endoscopy  -follow up on alpha-1 antitrypsin, ceruloplasmin, anti-smooth muscle hydrocolloid's, ANA, IgG lab following endoscopy -Use sucralfate suspension 1 gram PO QID -High risk for major hemorrhage  Hepatitis B surface antibody Transaminitis Hypoalbuminemia Hepatitis B surface antibody reactive, indicating exposure to infection verus vaccination in the past.  INR at 1.4. Albumin of 1.8, alk phosphatase of 148 and bilirubin 0.4 on October 30.  -Hepatitis B core antibody  MSSA septic arthritis of left knee s/p I&D Leukocytosis Cellulitis of left residual limb Open Arthrotomy completed 10/24 with large purulent effusion that grew MSSA on cefazolin day 6. Leukocytosis improving from 21.1 to 18.5. Base of left residual limb appears less erythematous, but there is an area of swelling with circumscribed erythema that is 5 cm x 5 cm area.   -IV cefazolin 2g q 8 hours, day 6, Stop date 10/03/21 -Picc line placed -Dr. Sharol Given evaluated with recommendations for follow-up in his office on discharge -Blood and urine cultures final result with no growth -trend CBC -Tylenol 650 mg, BID for pain -TOC consult for SNF placement, [facility] so possible he can receive IV abx there, however q 8 hour dosing makes this challenging.  AKI on CKD 2/2 ATN vs Prerenal Oliguria-resolved Urinary retention Baseline Cr of 1.5. Initial presenting creatinine of  5.68, thought to be secondary to Acute tubular necrosis in setting of sepsis.  Creatinine was improving, then began to worsen and patient was found to have urinary retention. Foley catheter placed and Creatinine began to improve.  Today, creatinine worsening from 1.86->2.00->2.08 with BUN from 47 to 67 reflecting RBC breakdown from GI bleed.  Patient was NPO yesterday for endoscopy and likely bump is prerenal due to dehydration.   -Lactated Ringer's 0.5 L bolus -Nephrology signed off -Continue to monitor renal function  panel  Hypokalemia Patient continuing to require potassium supplementation. K+ today at 3.3. Magnesium 10/30 at 2.2.  -monitor renal function panel -replete potassium   Type 2 diabetes with peripheral neuropathy Hypoglycemia on admission- resolved CBG's steadily rising over past few days. Patient was NPO yesterday, will advance diet from clear liquids to carb modified as tolerated.  -CBGs q 4 hours -Sliding scale insulin changed to resistant  -holding carbamazepime due to sedating effect   Alcohol use disorder Transaminitis Endorses last alcoholic beverage a year ago. hepatic function panel improving. PT/INR pending. No evidence of cirrhosis on imaging.   -ciwa discontinued -Holding sedating medications, including home medications of lyrica and carbamazepine. -thiamine 532 mg -Folic acid 1 mg   EKG changes Prolonged Qtc HFpEF On exam, patient does not appear volume overloaded. Echo showed left ventricular EF of 60 to 65% with no wall motion abnormalities with mild concentric left ventricular hypertrophy. Repeat EKG overnight  without acute ischemic changes from prior.  -Telemetry monitoring -holding home medication of Lasix 20 mg until creatinine improves   Normocytic anemia Iron deficiency Patient received one unit of RBCs. Vomiting episode as per above, suspect 2/2 upper gi  bleed.  This appears to be around his baseline.   -Monitor CBC  Hypertension Home medications include Amlodipine 10 mg, Hydralazine 50 mg TID, and Metoprolol 24 hr 200 mg. Blood pressures having improved on amlodipine and hydralazine, however will hold in setting of suspected upper GI bleed.   -Amlodipine 10 mg -Added hydralazine 25 mg TID -Holding Metoprolol and Lasix   Hyperlipidemia Lipid panel showed triglycerides of 208 and LDL not able to be calculated.    COPD Albuterol inhaler at home.   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 -On aspirin 81 mg and pentoxifylline  400 mg 3 times daily at home -Hold aspirin as per above.  Diet: NPO IVF: None,None VTE:  Holding heparin Code: Full Family update: Marcie Bal (Education officer, museum with [facility]) and sister updated today  Dispo: Anticipated discharge to Skilled nursing facility pending further workup and evaluation  Signature: Christiana Fuchs, D.O. Internal Medicine Resident, PGY-1 [facility]  Pager: [phone] 5:43 AM, 08/26/2021

## 2021-08-26 NOTE — Progress Notes (Signed)
Subjective: Is complaining that he is hungry    Antibiotics:  Anti-infectives (From admission, onward)    Start     Dose/Rate Route Frequency Ordered Stop   08/25/21 2300  ceFAZolin (ANCEF) IVPB 2g/100 mL premix  Status:  Discontinued        2 g 200 mL/hr over 30 Minutes Intravenous Every 8 hours 08/24/21 2213 08/24/21 2309   08/24/21 2315  ceFAZolin (ANCEF) IVPB 2g/100 mL premix        2 g 200 mL/hr over 30 Minutes Intravenous Every 8 hours 08/24/21 2310     08/21/21 1415  ceFAZolin (ANCEF) IVPB 2g/100 mL premix  Status:  Discontinued        2 g 200 mL/hr over 30 Minutes Intravenous Every 12 hours 08/21/21 1322 08/24/21 2213   08/20/21 2300  vancomycin (VANCOREADY) IVPB 750 mg/150 mL  Status:  Discontinued        750 mg 150 mL/hr over 60 Minutes Intravenous Every 48 hours 08/20/21 0737 08/21/21 1322   08/19/21 2100  ceFEPIme (MAXIPIME) 1 g in sodium chloride 0.9 % 100 mL IVPB  Status:  Discontinued        1 g 200 mL/hr over 30 Minutes Intravenous Every 24 hours 08/18/21 1944 08/21/21 1021   08/18/21 1945  ceFEPIme (MAXIPIME) 2 g in sodium chloride 0.9 % 100 mL IVPB        2 g 200 mL/hr over 30 Minutes Intravenous  Once 08/18/21 1936 08/18/21 2044   08/18/21 1945  metroNIDAZOLE (FLAGYL) IVPB 500 mg        500 mg 100 mL/hr over 60 Minutes Intravenous  Once 08/18/21 1936 08/18/21 2255   08/18/21 1945  vancomycin (VANCOREADY) IVPB 2000 mg/400 mL        2,000 mg 200 mL/hr over 120 Minutes Intravenous  Once 08/18/21 1936 08/19/21 0138   08/18/21 1943  vancomycin variable dose per unstable renal function (pharmacist dosing)  Status:  Discontinued         Does not apply See admin instructions 08/18/21 1943 08/21/21 1322       Medications: Scheduled Meds:  sodium chloride   Intravenous Once   Chlorhexidine Gluconate Cloth  6 each Topical Daily   hydrocerin   Topical BID   insulin aspart  0-15 Units Subcutaneous TID WC   pantoprazole (PROTONIX) IV  40 mg Intravenous  Q12H   sucralfate  1 g Oral TID WC & HS   thiamine  100 mg Oral Daily   Continuous Infusions:   ceFAZolin (ANCEF) IV 2 g (08/26/21 0601)   PRN Meds:.[DISCONTINUED] acetaminophen **OR** acetaminophen, acetaminophen, albuterol, alum & mag hydroxide-simeth, bisacodyl, senna    Objective: Weight change: 0.1 kg  Intake/Output Summary (Last 24 hours) at 08/26/2021 1225 Last data filed at 08/26/2021 0601 Gross per 24 hour  Intake 946.81 ml  Output 730 ml  Net 216.81 ml   Blood pressure (!) 154/66, pulse 96, temperature 98.3 F (36.8 C), temperature source Oral, resp. rate 18, height 5\' 6"  (1.676 m), weight 87 kg, SpO2 95 %. Temp:  [97.5 F (36.4 C)-98.4 F (36.9 C)] 98.3 F (36.8 C) (10/31 0812) Pulse Rate:  [92-105] 96 (10/31 0812) Resp:  [15-24] 18 (10/31 0812) BP: (112-173)/(47-87) 154/66 (10/31 0812) SpO2:  [93 %-100 %] 95 % (10/31 0812) Weight:  [87 kg] 87 kg (10/31 0545)  Physical Exam: Physical Exam Constitutional:      Appearance: He is obese.  Cardiovascular:  Rate and Rhythm: Normal rate and regular rhythm.  Pulmonary:     Effort: Pulmonary effort is normal. No respiratory distress.  Abdominal:     General: There is no distension.  Neurological:     General: No focal deficit present.     Mental Status: He is alert.  Psychiatric:        Speech: Speech is delayed.        Cognition and Memory: Memory is impaired. He exhibits impaired recent memory and impaired remote memory.     CBC:    BMET Recent Labs    08/25/21 0221 08/26/21 0051  NA 133* 133*  K 4.1 3.3*  CL 98 96*  CO2 23 26  GLUCOSE 350* 203*  BUN 56* 67*  CREATININE 2.00* 2.08*  CALCIUM 8.2* 8.0*     Liver Panel  Recent Labs    08/25/21 0826 08/26/21 0051  PROT 6.7  --   ALBUMIN 1.8* 1.9*  AST 26  --   ALT 9  --   ALKPHOS 148*  --   BILITOT 0.9  --   BILIDIR 0.3*  --   IBILI 0.6  --        Sedimentation Rate No results for input(s): ESRSEDRATE in the last 72  hours. C-Reactive Protein No results for input(s): CRP in the last 72 hours.  Micro Results: Recent Results (from the past 720 hour(s))  Resp Panel by RT-PCR (Flu A&B, Covid) Nasopharyngeal Swab     Status: None   Collection Time: 08/18/21  7:36 PM   Specimen: Nasopharyngeal Swab; Nasopharyngeal(NP) swabs in vial transport medium  Result Value Ref Range Status   SARS Coronavirus 2 by RT PCR NEGATIVE NEGATIVE Final    Comment: (NOTE) SARS-CoV-2 target nucleic acids are NOT DETECTED.  The SARS-CoV-2 RNA is generally detectable in upper respiratory specimens during the acute phase of infection. The lowest concentration of SARS-CoV-2 viral copies this assay can detect is 138 copies/mL. A negative result does not preclude SARS-Cov-2 infection and should not be used as the sole basis for treatment or other patient management decisions. A negative result may occur with  improper specimen collection/handling, submission of specimen other than nasopharyngeal swab, presence of viral mutation(s) within the areas targeted by this assay, and inadequate number of viral copies(<138 copies/mL). A negative result must be combined with clinical observations, patient history, and epidemiological information. The expected result is Negative.  Fact Sheet for Patients:  EntrepreneurPulse.com.au  Fact Sheet for Healthcare Providers:  IncredibleEmployment.be  This test is no t yet approved or cleared by the [place] FDA and  has been authorized for detection and/or diagnosis of SARS-CoV-2 by FDA under an Emergency Use Authorization (EUA). This EUA will remain  in effect (meaning this test can be used) for the duration of the COVID-19 declaration under Section 564(b)(1) of the Act, 21 U.S.C.section 360bbb-3(b)(1), unless the authorization is terminated  or revoked sooner.       Influenza A by PCR NEGATIVE NEGATIVE Final   Influenza B by PCR NEGATIVE NEGATIVE  Final    Comment: (NOTE) The Xpert Xpress SARS-CoV-2/FLU/RSV plus assay is intended as an aid in the diagnosis of influenza from Nasopharyngeal swab specimens and should not be used as a sole basis for treatment. Nasal washings and aspirates are unacceptable for Xpert Xpress SARS-CoV-2/FLU/RSV testing.  Fact Sheet for Patients: EntrepreneurPulse.com.au  Fact Sheet for Healthcare Providers: IncredibleEmployment.be  This test is not yet approved or cleared by the [place] FDA and has  been authorized for detection and/or diagnosis of SARS-CoV-2 by FDA under an Emergency Use Authorization (EUA). This EUA will remain in effect (meaning this test can be used) for the duration of the COVID-19 declaration under Section 564(b)(1) of the Act, 21 U.S.C. section 360bbb-3(b)(1), unless the authorization is terminated or revoked.  Performed at [facility], [place] [address] [place], [place] [address]   Urine Culture     Status: None   Collection Time: 08/18/21  7:36 PM   Specimen: In/Out Cath Urine  Result Value Ref Range Status   Specimen Description IN/OUT CATH URINE  Final   Special Requests NONE  Final   Culture   Final    NO GROWTH Performed at [facility], [place] [address], [place], [place] [address]    Report Status 08/20/2021 FINAL  Final  Blood Culture (routine x 2)     Status: None   Collection Time: 08/18/21  8:10 PM   Specimen: BLOOD  Result Value Ref Range Status   Specimen Description BLOOD LEFT ARM  Final   Special Requests   Final    BOTTLES DRAWN AEROBIC AND ANAEROBIC Blood Culture adequate volume   Culture   Final    NO GROWTH 5 DAYS Performed at [facility], [place] [address], [place], [place] [address]    Report Status 08/23/2021 FINAL  Final  Blood Culture (routine x 2)     Status: None   Collection Time: 08/18/21  8:12 PM   Specimen: BLOOD RIGHT HAND  Result Value Ref Range Status   Specimen Description  BLOOD RIGHT HAND  Final   Special Requests   Final    BOTTLES DRAWN AEROBIC AND ANAEROBIC Blood Culture adequate volume   Culture   Final    NO GROWTH 5 DAYS Performed at [facility], [place] [address], [place], [place] [address]    Report Status 08/23/2021 FINAL  Final  Body fluid culture w Gram Stain     Status: None   Collection Time: 08/19/21  9:28 AM   Specimen: Body Fluid  Result Value Ref Range Status   Specimen Description FLUID  Final   Special Requests SYNOV KNEE  Final   Gram Stain   Final    NO ORGANISMS SEEN SQUAMOUS EPITHELIAL CELLS PRESENT MODERATE WBC PRESENT, PREDOMINANTLY PMN MODERATE GRAM POSITIVE COCCI Performed at [facility], [place] [address] [place],  [address]    Culture FEW STAPHYLOCOCCUS AUREUS  Final   Report Status 08/22/2021 FINAL  Final   Organism ID, Bacteria STAPHYLOCOCCUS AUREUS  Final      Susceptibility   Staphylococcus aureus - MIC*    CIPROFLOXACIN <=0.5 SENSITIVE Sensitive     ERYTHROMYCIN >=8 RESISTANT Resistant     GENTAMICIN <=0.5 SENSITIVE Sensitive     OXACILLIN 0.5 SENSITIVE Sensitive     TETRACYCLINE <=1 SENSITIVE Sensitive     VANCOMYCIN 1 SENSITIVE Sensitive     TRIMETH/SULFA <=10 SENSITIVE Sensitive     CLINDAMYCIN RESISTANT Resistant     RIFAMPIN <=0.5 SENSITIVE Sensitive     Inducible Clindamycin POSITIVE Resistant     * FEW STAPHYLOCOCCUS AUREUS  Aerobic/Anaerobic Culture w Gram Stain (surgical/deep wound)     Status: None   Collection Time: 08/19/21  7:22 PM   Specimen: Synovial, Left Knee; Body Fluid  Result Value Ref Range Status   Specimen Description KNEE LEFT SYNOVIAL FLUID  Final   Special Requests CUP ID A  Final  Gram Stain   Final    NO ORGANISMS SEEN SQUAMOUS EPITHELIAL CELLS PRESENT ABUNDANT WBC PRESENT,BOTH PMN AND MONONUCLEAR FEW GRAM POSITIVE COCCI    Culture   Final    MODERATE STAPHYLOCOCCUS AUREUS NO ANAEROBES ISOLATED Performed at [facility], [address]  [place], [place] [address]    Report Status 08/24/2021 FINAL  Final   Organism ID, Bacteria STAPHYLOCOCCUS AUREUS  Final      Susceptibility   Staphylococcus aureus - MIC*    CIPROFLOXACIN <=0.5 SENSITIVE Sensitive     ERYTHROMYCIN >=8 RESISTANT Resistant     GENTAMICIN <=0.5 SENSITIVE Sensitive     OXACILLIN <=0.25 SENSITIVE Sensitive     TETRACYCLINE <=1 SENSITIVE Sensitive     VANCOMYCIN <=0.5 SENSITIVE Sensitive     TRIMETH/SULFA <=10 SENSITIVE Sensitive     CLINDAMYCIN RESISTANT Resistant     RIFAMPIN <=0.5 SENSITIVE Sensitive     Inducible Clindamycin POSITIVE Resistant     * MODERATE STAPHYLOCOCCUS AUREUS    Studies/Results: No results found.    Assessment/Plan:  INTERVAL HISTORY: Patient's cognition is improving   Principal Problem:   Septic arthritis of knee (HCC) Active Problems:   S/P BKA (below knee amputation) bilateral (HCC)   Acute kidney injury ([place])   Altered mental status   S/P debridement   MSSA (methicillin susceptible Staphylococcus aureus) infection   Cellulitis of left lower extremity   Pressure injury of skin    Leslie [name] is a 75 y.o. male with with past medical history significant for alcoholism below the knee amputations bilaterally admitted with septic arthritis of his left knee also with GI bleed and acute renal failure.  He is undergoing an open arthrotomy and MSSA has been isolated from culture from the knee.  1.  MSSA septic knee:  Ideally should receive 6 weeks of IV cefazolin but this will need to be done in a skilled facility  Not clear to me if he will agree to this if and when he demonstrates competence.  I am absolutely 100% not comfortabl managing IV antibiotics for him at his home.  2.  Encephalopathy: Multifactorial and seems to be improving as mentioned above I would not feel comfortable managing IV antibiotics with him anywhere except for the hospital at a skilled facility  I will sign off for now but can you please let  me know when patient  is nearing DC so I can help formulate a long term IV antibiotic plan?     LOS: 8 days   Alcide Evener 08/26/2021, 12:25 PM ,

## 2021-08-26 NOTE — H&P (View-Only) (Signed)
Brief GI Progress Note: Per discussion with Dr. Candis Schatz and review of the EGD report, there are no additional endoscopic therapies that can be offered for the duodenal ulcer. Please reach out to IR if the patient has recurrent episodes of bleeding. GI will sign off. We will arrange for clinic follow up with Dr. Candis Schatz as an outpatient. Please call if any new questions arise.

## 2021-08-27 ENCOUNTER — Other Ambulatory Visit: Payer: Self-pay

## 2021-08-27 DIAGNOSIS — N179 Acute kidney failure, unspecified: Secondary | ICD-10-CM | POA: Diagnosis not present

## 2021-08-27 DIAGNOSIS — R4182 Altered mental status, unspecified: Secondary | ICD-10-CM | POA: Diagnosis not present

## 2021-08-27 LAB — RENAL FUNCTION PANEL
Albumin: 1.9 g/dL — ABNORMAL LOW (ref 3.5–5.0)
Anion gap: 8 (ref 5–15)
BUN: 50 mg/dL — ABNORMAL HIGH (ref 8–23)
CO2: 25 mmol/L (ref 22–32)
Calcium: 8 mg/dL — ABNORMAL LOW (ref 8.9–10.3)
Chloride: 103 mmol/L (ref 98–111)
Creatinine, Ser: 1.71 mg/dL — ABNORMAL HIGH (ref 0.61–1.24)
GFR, Estimated: 41 mL/min — ABNORMAL LOW (ref >60)
Glucose, Bld: 224 mg/dL — ABNORMAL HIGH (ref 70–99)
Phosphorus: 2.2 mg/dL — ABNORMAL LOW (ref 2.5–4.6)
Potassium: 4.8 mmol/L (ref 3.5–5.1)
Sodium: 136 mmol/L (ref 135–145)

## 2021-08-27 LAB — HEMOGLOBIN AND HEMATOCRIT, BLOOD
HCT: 27.1 % — ABNORMAL LOW (ref 39.0–52.0)
Hemoglobin: 8.7 g/dL — ABNORMAL LOW (ref 13.0–17.0)

## 2021-08-27 LAB — HEPATITIS B CORE ANTIBODY, TOTAL: Hep B Core Total Ab: REACTIVE — AB

## 2021-08-27 LAB — ALPHA-1-ANTITRYPSIN: A-1 Antitrypsin, Ser: 285 mg/dL — ABNORMAL HIGH (ref 101–187)

## 2021-08-27 LAB — CBC
HCT: 24.2 % — ABNORMAL LOW (ref 39.0–52.0)
Hemoglobin: 7.6 g/dL — ABNORMAL LOW (ref 13.0–17.0)
MCH: 28.5 pg (ref 26.0–34.0)
MCHC: 31.4 g/dL (ref 30.0–36.0)
MCV: 90.6 fL (ref 80.0–100.0)
Platelets: 279 10*3/uL (ref 150–400)
RBC: 2.67 MIL/uL — ABNORMAL LOW (ref 4.22–5.81)
RDW: 17.8 % — ABNORMAL HIGH (ref 11.5–15.5)
WBC: 17.2 10*3/uL — ABNORMAL HIGH (ref 4.0–10.5)
nRBC: 0 % (ref 0.0–0.2)

## 2021-08-27 LAB — CERULOPLASMIN: Ceruloplasmin: 34.4 mg/dL — ABNORMAL HIGH (ref 16.0–31.0)

## 2021-08-27 LAB — MITOCHONDRIAL ANTIBODIES: Mitochondrial M2 Ab, IgG: <20 Units (ref 0.0–20.0)

## 2021-08-27 LAB — GLUCOSE, CAPILLARY
Glucose-Capillary: 215 mg/dL — ABNORMAL HIGH (ref 70–99)
Glucose-Capillary: 220 mg/dL — ABNORMAL HIGH (ref 70–99)
Glucose-Capillary: 254 mg/dL — ABNORMAL HIGH (ref 70–99)
Glucose-Capillary: 198 mg/dL — ABNORMAL HIGH (ref 70–99)

## 2021-08-27 LAB — IGG: IgG (Immunoglobin G), Serum: 1296 mg/dL (ref 603–1613)

## 2021-08-27 LAB — ANTI-SMOOTH MUSCLE ANTIBODY, IGG: F-Actin IgG: 9 Units (ref 0–19)

## 2021-08-27 LAB — ANTINUCLEAR ANTIBODIES, IFA: ANA Ab, IFA: NEGATIVE

## 2021-08-27 LAB — PREPARE RBC (CROSSMATCH)

## 2021-08-27 MED ORDER — SODIUM CHLORIDE 0.9% FLUSH
10.0000 mL | INTRAVENOUS | Status: DC | PRN
  Administered 2021-09-13: 20 mL

## 2021-08-27 MED ORDER — SODIUM CHLORIDE 0.9% FLUSH
10.0000 mL | Freq: Two times a day (BID) | INTRAVENOUS | Status: DC
  Administered 2021-08-28 – 2021-09-02 (×8): 10 mL
  Administered 2021-09-03: 20 mL
  Administered 2021-09-03 – 2021-09-11 (×9): 10 mL
  Administered 2021-09-12: 11:00:00 20 mL
  Administered 2021-09-12: 22:00:00 10 mL

## 2021-08-27 MED ORDER — SODIUM CHLORIDE 0.9% IV SOLUTION: Freq: Once | INTRAVENOUS | Status: DC

## 2021-08-27 MED ORDER — HYDROMORPHONE HCL 1 MG/ML IJ SOLN
0.5000 mg | INTRAMUSCULAR | Status: DC | PRN
  Administered 2021-08-27 – 2021-08-28 (×5): 0.5 mg via INTRAVENOUS
  Filled 2021-08-27 (×5): qty 1

## 2021-08-27 NOTE — Progress Notes (Addendum)
Pt had black tardy stool x 2 tonight. His Hb trend down, Hb 7.6 this am. Denied nausea or vomiting or abdominal pain. His hemodynamic has been stable. He slept well tonight. Pain on his leg is well tolerated. No acute distress. Will monitor.  Kennyth Lose, RN

## 2021-08-27 NOTE — Progress Notes (Addendum)
Overnight: According to night nurse, patient had 2 bouts of black tarry stools.  Patient denies nausea, vomiting, or abdominal pain.  Patient remained afebrile and was hemodynamically stable.  Subjective: I  evaluated [name] at bedside.  He was lying comfortably in bed.  Patient endorses some tenderness around the left residual limb.  Patient reports 1 bout of black tarry stool today.  Patient expressed appetite and has tolerated clear liquids well and feels ready for an advanced diet.  PICC line has been placed, patient denies any complications.   Objective:  Vital signs in last 24 hours: Vitals:   08/27/21 0812 08/27/21 1054 08/27/21 1130 08/27/21 1415  BP: (!) 165/108 (!) 149/95 (!) 151/66 (!) 108/93  Pulse: 100 95 90 (!) 102  Resp: 15 18 17    Temp: 98 F (36.7 C) 98.5 F (36.9 C) 98.1 F (36.7 C) 98.9 F (37.2 C)  TempSrc: Oral Oral Oral Oral  SpO2: 100% 100% 99% 100%  Weight:      Height:       Review of Systems  Constitutional:  Negative for chills, fever and malaise/fatigue.  Respiratory:  Negative for shortness of breath.   Cardiovascular:  Negative for chest pain and palpitations.  Gastrointestinal:  Positive for melena. Negative for abdominal pain, constipation, diarrhea, heartburn, nausea and vomiting.  Neurological:  Negative for dizziness and weakness.   Physical Exam Constitutional:      General: He is not in acute distress. HENT:     Head: Normocephalic and atraumatic.  Eyes:     General: Lids are normal.  Cardiovascular:     Rate and Rhythm: Normal rate.     Heart sounds: Normal heart sounds.  Pulmonary:     Effort: Pulmonary effort is normal.     Breath sounds: Normal breath sounds.  Genitourinary:    Comments: Urinary catheter in place Musculoskeletal:     Right Lower Extremity: Right leg is amputated below knee. (Erythema, edema, warth of left residual limb)    Left Lower Extremity: Left leg is amputated below knee.  Skin:    General: Skin  is warm and dry.  Neurological:     General: No focal deficit present.     Mental Status: He is alert.  Psychiatric:        Attention and Perception: Attention normal.        Mood and Affect: Mood normal.        Behavior: Behavior is cooperative.        Assessment/Plan:  Principal Problem:   Septic arthritis of knee (HCC) Active Problems:   S/P BKA (below knee amputation) bilateral (HCC)   Acute kidney injury ([place])   Altered mental status   S/P debridement   MSSA (methicillin susceptible Staphylococcus aureus) infection   Cellulitis of left lower extremity   Pressure injury of skin  Septic Arthritis of left knee Patient reports tenderness of the left residual limb.  On physical exam, erythema, edema and warmth noted of the left residual limb.  Patient remains afebrile.  Patient denies chills. --PICC line in place --Cefazolin 2g Q8H- 6wks per ID recc --IV Dilaudid 0.5 mg every 4 hours for pain --Monitor vitals and left residual limb for acute signs of infection  Acute Renal Failure Cr function improving. Electrolytes- WNL --Trend BMP  Upper GI bleed 2/2 duodenal ulcer Patient was found to have large duodenal ulcer on endoscopy.  No surgical intervention was indicated at that time.  IR recommended close monitoring for catastrophic hemorrhaging.  Nurse reports 2 bouts of black tarry stool overnight.  Patient also had 1 episode of black tarry stool today.  Patient denies fevers, chills, nausea, vomiting or abdominal pain.  Patient is hemodynamically stable.  Labs reveal hemoglobin of 7.6 --Transfused 1 unit of blood --Monitor H&H --CBC daily  Type 2 diabetes with peripheral neuropathy -- Advance diet as tolerated --SSI resistant --CBGs Q4H  Acute encephalopathy, resolved     Prior to Admission Living Arrangement: [facility] Anticipated Discharge Location: [name] facility Barriers to Discharge: Bed availability Dispo: Anticipated discharge in approximately 1-2  day(s).   Leslie Lasso, MD 08/27/2021, 3:35 PM Pager: [phone] After 5pm on weekdays and 1pm on weekends: On Call pager [phone]

## 2021-08-27 NOTE — TOC Progression Note (Signed)
Transition of Care [facility]) - Progression Note    Patient Details  Name: Leslie [name] MRN: [id number] Date of Birth: [birth year]  Transition of Care [facility]) CM/SW [place], [place] Phone Number: 08/27/2021, 4:46 PM  Clinical Narrative:     CSW has contacted [facility]- they are reviewing and will  follow up with CSW.   CSW will continue to follow and assist with discharge planning.  Thurmond Butts, MSW, LCSW Clinical Social Worker    Expected Discharge Plan: Skilled Nursing Facility Barriers to Discharge: Continued Medical Work up, SNF Pending bed offer  Expected Discharge Plan and Services Expected Discharge Plan: [place] arrangements for the past 2 months: Single Family Home                                       Social Determinants of Health (SDOH) Interventions    Readmission Risk Interventions No flowsheet data found.

## 2021-08-27 NOTE — Progress Notes (Signed)
Peripherally Inserted Central Catheter Placement  The IV Nurse has discussed with the patient and/or persons authorized to consent for the patient, the purpose of this procedure and the potential benefits and risks involved with this procedure.  The benefits include less needle sticks, lab draws from the catheter, and the patient may be discharged home with the catheter. Risks include, but not limited to, infection, bleeding, blood clot (thrombus formation), and puncture of an artery; nerve damage and irregular heartbeat and possibility to perform a PICC exchange if needed/ordered by physician.  Alternatives to this procedure were also discussed.  Bard Power PICC patient education guide, fact sheet on infection prevention and patient information card has been provided to patient /or left at bedside.    PICC Placement Documentation  PICC Single Lumen 22/63/33 Right Basilic 48 cm 1 cm (Active)  Indication for Insertion or Continuance of Line Home intravenous therapies (PICC only) 08/27/21 2205  Exposed Catheter (cm) 1 cm 08/27/21 2205  Site Assessment Clean;Dry;Intact 08/27/21 2205  Line Status Flushed;Saline locked;Blood return noted 08/27/21 2205  Dressing Type Transparent;Securing device 08/27/21 2205  Dressing Status Clean;Dry;Intact 08/27/21 2205  Antimicrobial disc in place? Yes 08/27/21 2205  Safety Lock Not Applicable 54/56/25 6389  Dressing Change Due 09/03/21 08/27/21 2205       Shon Hale 08/27/2021, 10:17 PM

## 2021-08-27 NOTE — Progress Notes (Signed)
Occupational Therapy Treatment Patient Details Name: Leslie [name] MRN: [id number] DOB: 1946/10/14 Today's Date: 08/27/2021   History of present illness Pt is a 75 y.o. male admitted 08/18/21 with AMS and L knee pain; workup for L knee sepsis. S/p L knee aspiration, then L knee I&D on 10/24. PMH includes PAD s/p bilateral BKA (left 2017, right 2019), cellulitis, COPD, gout, arthritis, DM2.   OT comments  Pt progressing towards acute OT goals. Pt more alert and tolerating activity better today compared to last OT (Saturday). Pt received EOB with PT, sat EOB about 15 minutes to eat lunch, fair-good sitting balance. Min A to return to supine. Pt observed to have had an episode of bowel incontinence, pt unaware. With heavy use of bedside rail, pt able to roll self to each side for pericare in sidelying position; total A for pericare task. D/c recommendation remains appropriate.    Recommendations for follow up therapy are one component of a multi-disciplinary discharge planning process, led by the attending physician.  Recommendations may be updated based on patient status, additional functional criteria and insurance authorization.    Follow Up Recommendations  Skilled nursing-short term rehab (<3 hours/day)    Assistance Recommended at Discharge Frequent or constant Supervision/Assistance  Equipment Recommendations  Other (comment) (defer to next venue)    Recommendations for Other Services      Precautions / Restrictions Precautions Precautions: Fall;Other (comment) Precaution Comments: chronic bilateral BKA (does not have prosthetics at hospital). significant h/o falls Restrictions Weight Bearing Restrictions: Yes RLE Weight Bearing: Non weight bearing LLE Weight Bearing: Non weight bearing Other Position/Activity Restrictions: S/p L knee aspiration, then L knee I&D on 10/24       Mobility Bed Mobility Overal bed mobility: Needs Assistance Bed Mobility: Sit to Supine        Sit to supine: Min assist;HOB elevated   General bed mobility comments: extra time and effort, use of bed rails and offloading/advancing each hip in isolation. Assist to steady and control descent    Transfers                   General transfer comment: deferred d/t pt not having R prosthetic leg     Balance Overall balance assessment: Needs assistance;History of Falls Sitting-balance support: No upper extremity supported Sitting balance-Leahy Scale: Good Sitting balance - Comments: able to eat lunch sitting EOB                                   ADL either performed or assessed with clinical judgement   ADL Overall ADL's : Needs assistance/impaired Eating/Feeding: Modified independent;Sitting Eating/Feeding Details (indicate cue type and reason): sat EOB to consume lunch, able to open containers with no assist                         Toileting- Clothing Manipulation and Hygiene: Total assistance;Bed level         General ADL Comments: Pt sat EOB about 15 minutes to eat lunch. Once back in bed pt noted to have had a bowel movement (pt unaware). Pt able to roll to each side at min guard level. total A for pericare.     Vision       Perception     Praxis      Cognition Arousal/Alertness: Awake/alert Behavior During Therapy: WFL for tasks assessed/performed Overall Cognitive Status: No family/caregiver present  to determine baseline cognitive functioning Area of Impairment: Attention;Memory;Following commands;Safety/judgement;Awareness;Problem solving                   Current Attention Level: Selective Memory: Decreased short-term memory Following Commands: Follows multi-step commands consistently;Follows multi-step commands with increased time Safety/Judgement: Decreased awareness of safety;Decreased awareness of deficits Awareness: Emergent Problem Solving: Slow processing General Comments: Improved ability to problem solve;  able to call sister and ask her to bring RLE prosthetic and wheelchair. Pt adamantly states, "Once I have my leg on, I'll be able to do everything" despite education that he has not been OOB since admission 10/23          Exercises     Shoulder Instructions       General Comments      Pertinent Vitals/ Pain       Pain Assessment: Faces Faces Pain Scale: Hurts little more Pain Location: L knee with mobility Pain Descriptors / Indicators: Discomfort Pain Intervention(s): Monitored during session;Limited activity within patient's tolerance  Home Living                                          Prior Functioning/Environment              Frequency  Min 2X/week        Progress Toward Goals  OT Goals(current goals can now be found in the care plan section)  Progress towards OT goals: Progressing toward goals  Acute Rehab OT Goals Patient Stated Goal: home OT Goal Formulation: With patient Time For Goal Achievement: 09/07/21 Potential to Achieve Goals: Good ADL Goals Pt Will Perform Grooming: with set-up;sitting Pt Will Perform Upper Body Dressing: with supervision;sitting Pt Will Perform Lower Body Dressing: with min guard assist;sitting/lateral leans Pt Will Transfer to Toilet: with min assist;squat pivot transfer;bedside commode Pt Will Perform Toileting - Clothing Manipulation and hygiene: with mod assist;sitting/lateral leans  Plan Discharge plan remains appropriate    Co-evaluation                 AM-PAC OT "6 Clicks" Daily Activity     Outcome Measure   Help from another person eating meals?: None Help from another person taking care of personal grooming?: A Little Help from another person toileting, which includes using toliet, bedpan, or urinal?: Total Help from another person bathing (including washing, rinsing, drying)?: A Little Help from another person to put on and taking off regular upper body clothing?: A Little Help  from another person to put on and taking off regular lower body clothing?: A Lot 6 Click Score: 16    End of Session    OT Visit Diagnosis: Other abnormalities of gait and mobility (R26.89);Muscle weakness (generalized) (M62.81);History of falling (Z91.81);Pain   Activity Tolerance Patient tolerated treatment well   Patient Left in bed;with call bell/phone within reach;with bed alarm set   Nurse Communication Other (comment) (sacral dressing discarded d/t bowel contamination)        Time: 7482-7078 OT Time Calculation (min): 35 min  Charges: OT General Charges $OT Visit: 1 Visit OT Treatments $Self Care/Home Management : 23-37 mins  Tyrone Schimke, OT Acute Rehabilitation Services Pager: [phone] Office: [phone]   Hortencia Pilar 08/27/2021, 1:12 PM

## 2021-08-27 NOTE — Progress Notes (Signed)
Physical Therapy Treatment Patient Details Name: Leslie [name] MRN: [id number] DOB: 1946-09-11 Today's Date: 08/27/2021   History of Present Illness Pt is a 75 y.o. male admitted 08/18/21 with AMS and L knee pain; workup for L knee sepsis. S/p L knee aspiration, then L knee I&D on 10/24. Course complicated by encephalopathy. Pt with GIB, s/p EGD 10/30 showing large duodenal ulcer with adherent clot. PMH includes PAD s/p bilateral BKA (left 2017, right 2019), cellulitis, COPD, gout, arthritis, DM2.   PT Comments    Pt progressing with mobility, also demonstrates improved alertness/awareness compared to prior sessions. Pt requiring assist for bed mobility and lateral scoot/seated balance tasks; deferred OOB transfer as pt does not have LE prosthetics in room, reports, "I'll be able to do everything once I have my leg on." Pt called sister during session who will plan to bring pt's RLE prosthetic to allow for mobility progression. Pt reports he hopes to return home; continue to recommend SNF-level therapies to maximize functional mobility and independence.    Recommendations for follow up therapy are one component of a multi-disciplinary discharge planning process, led by the attending physician.  Recommendations may be updated based on patient status, additional functional criteria and insurance authorization.  Follow Up Recommendations  Skilled nursing-short term rehab (<3 hours/day) - pt hopeful for d/c home     Assistance Recommended at Discharge Frequent or constant Supervision/Assistance  Equipment Recommendations  None recommended by PT    Recommendations for Other Services       Precautions / Restrictions Precautions Precautions: Fall;Other (comment) Precaution Comments: chronic bilateral BKA (does not have prosthetics at hospital). significant h/o falls Restrictions Weight Bearing Restrictions:  Other Position/Activity Restrictions: S/p L knee aspiration, then L knee I&D on  10/24     Mobility  Bed Mobility Overal bed mobility: Needs Assistance Bed Mobility: Supine to Sit     Supine to sit: Mod assist;HOB elevated Sit to supine: Min assist;HOB elevated   General bed mobility comments: Heavy modA for HHA to elevate trunk and scoot hips to EOB, use of rail for UE support    Transfers Overall transfer level: Needs assistance Equipment used: None Transfers: Lateral/Scoot Transfers            Lateral/Scoot Transfers: Min assist General transfer comment: Lateral scoot towards HOB with minA for scooting hips with bed pad, close min guard for balance to prevent anterior LOB from EOB; pt declined transfer to drop arm recliner wanting to wait until "I have my leg on"    Ambulation/Gait                 Stairs             Wheelchair Mobility    Modified Rankin (Stroke Patients Only)       Balance Overall balance assessment: Needs assistance;History of Falls Sitting-balance support: No upper extremity supported;Feet unsupported Sitting balance-Leahy Scale: Good Sitting balance - Comments: Able to maintain static sitting EOB without BLE prosthetics donned, initial min guard for dynamic balance progressing to supervision                                    Cognition Arousal/Alertness: Awake/alert Behavior During Therapy: WFL for tasks assessed/performed Overall Cognitive Status: No family/caregiver present to determine baseline cognitive functioning Area of Impairment: Attention;Memory;Following commands;Safety/judgement;Awareness;Problem solving  Current Attention Level: Selective Memory: Decreased short-term memory Following Commands: Follows multi-step commands consistently;Follows multi-step commands with increased time Safety/Judgement: Decreased awareness of safety;Decreased awareness of deficits Awareness: Emergent Problem Solving: Slow processing General Comments: Improved ability to  problem solve; able to call sister and ask her to bring RLE prosthetic and wheelchair. Pt adamantly states, "Once I have my leg on, I'll be able to do everything" despite education that he has not been OOB since admission 10/23        Exercises      General Comments General comments (skin integrity, edema, etc.): Pt called sister during session to request she bring RLE prosthetic and w/c. Pt reports hopeful to d/c home instead of SNF, does not seemed concerned that he has not been OOB since admission 08/18/21      Pertinent Vitals/Pain Pain Assessment: Faces Faces Pain Scale: Hurts little more Pain Location: L knee with mobility Pain Descriptors / Indicators: Discomfort Pain Intervention(s): Monitored during session;Limited activity within patient's tolerance    Home Living                          Prior Function            PT Goals (current goals can now be found in the care plan section) Progress towards PT goals: Progressing toward goals    Frequency    Min 3X/week      PT Plan Frequency needs to be updated    Co-evaluation              AM-PAC PT "6 Clicks" Mobility   Outcome Measure  Help needed turning from your back to your side while in a flat bed without using bedrails?: A Lot Help needed moving from lying on your back to sitting on the side of a flat bed without using bedrails?: A Lot Help needed moving to and from a bed to a chair (including a wheelchair)?: A Lot Help needed standing up from a chair using your arms (e.g., wheelchair or bedside chair)?: A Lot Help needed to walk in hospital room?: Total Help needed climbing 3-5 steps with a railing? : Total 6 Click Score: 10    End of Session   Activity Tolerance: Patient tolerated treatment well Patient left: in bed;with call bell/phone within reach;Other (comment) (with OT) Nurse Communication: Mobility status PT Visit Diagnosis: Muscle weakness (generalized) (M62.81);Other  abnormalities of gait and mobility (R26.89);Repeated falls (R29.6);Pain Pain - Right/Left: Left Pain - part of body: Knee     Time: 1202-1221 PT Time Calculation (min) (ACUTE ONLY): 19 min  Charges:  $Therapeutic Activity: 8-22 mins                    Mabeline Caras, PT, DPT Acute Rehabilitation Services  Pager [phone] Office [phone]  Derry Lory 08/27/2021, 1:16 PM

## 2021-08-28 ENCOUNTER — Inpatient Hospital Stay (HOSPITAL_COMMUNITY): Payer: Medicare (Managed Care)

## 2021-08-28 DIAGNOSIS — L03116 Cellulitis of left lower limb: Secondary | ICD-10-CM | POA: Diagnosis not present

## 2021-08-28 DIAGNOSIS — R4182 Altered mental status, unspecified: Secondary | ICD-10-CM | POA: Diagnosis not present

## 2021-08-28 LAB — CBC WITH DIFFERENTIAL/PLATELET
Abs Immature Granulocytes: 0.13 10*3/uL — ABNORMAL HIGH (ref 0.00–0.07)
Basophils Absolute: 0.1 10*3/uL (ref 0.0–0.1)
Basophils Relative: 0 %
Eosinophils Absolute: 0 10*3/uL (ref 0.0–0.5)
Eosinophils Relative: 0 %
HCT: 23.9 % — ABNORMAL LOW (ref 39.0–52.0)
Hemoglobin: 7.9 g/dL — ABNORMAL LOW (ref 13.0–17.0)
Immature Granulocytes: 1 %
Lymphocytes Relative: 10 %
Lymphs Abs: 1.7 10*3/uL (ref 0.7–4.0)
MCH: 29.4 pg (ref 26.0–34.0)
MCHC: 33.1 g/dL (ref 30.0–36.0)
MCV: 88.8 fL (ref 80.0–100.0)
Monocytes Absolute: 1.3 10*3/uL — ABNORMAL HIGH (ref 0.1–1.0)
Monocytes Relative: 8 %
Neutro Abs: 13.6 10*3/uL — ABNORMAL HIGH (ref 1.7–7.7)
Neutrophils Relative %: 81 %
Platelets: 249 10*3/uL (ref 150–400)
RBC: 2.69 MIL/uL — ABNORMAL LOW (ref 4.22–5.81)
RDW: 18.4 % — ABNORMAL HIGH (ref 11.5–15.5)
WBC: 16.8 10*3/uL — ABNORMAL HIGH (ref 4.0–10.5)
nRBC: 0 % (ref 0.0–0.2)

## 2021-08-28 LAB — RENAL FUNCTION PANEL
Albumin: 1.8 g/dL — ABNORMAL LOW (ref 3.5–5.0)
Anion gap: 10 (ref 5–15)
BUN: 38 mg/dL — ABNORMAL HIGH (ref 8–23)
CO2: 22 mmol/L (ref 22–32)
Calcium: 7.5 mg/dL — ABNORMAL LOW (ref 8.9–10.3)
Chloride: 96 mmol/L — ABNORMAL LOW (ref 98–111)
Creatinine, Ser: 1.51 mg/dL — ABNORMAL HIGH (ref 0.61–1.24)
GFR, Estimated: 48 mL/min — ABNORMAL LOW (ref >60)
Glucose, Bld: 206 mg/dL — ABNORMAL HIGH (ref 70–99)
Phosphorus: 2.2 mg/dL — ABNORMAL LOW (ref 2.5–4.6)
Potassium: 4.2 mmol/L (ref 3.5–5.1)
Sodium: 128 mmol/L — ABNORMAL LOW (ref 135–145)

## 2021-08-28 LAB — GLUCOSE, CAPILLARY
Glucose-Capillary: 231 mg/dL — ABNORMAL HIGH (ref 70–99)
Glucose-Capillary: 252 mg/dL — ABNORMAL HIGH (ref 70–99)
Glucose-Capillary: 273 mg/dL — ABNORMAL HIGH (ref 70–99)

## 2021-08-28 LAB — OSMOLALITY, URINE: Osmolality, Ur: 405 mOsm/kg (ref 300–900)

## 2021-08-28 LAB — HEMOGLOBIN AND HEMATOCRIT, BLOOD
HCT: 26.9 % — ABNORMAL LOW (ref 39.0–52.0)
Hemoglobin: 9.1 g/dL — ABNORMAL LOW (ref 13.0–17.0)

## 2021-08-28 LAB — PREPARE RBC (CROSSMATCH)

## 2021-08-28 LAB — SURGICAL PATHOLOGY

## 2021-08-28 LAB — SODIUM, URINE, RANDOM: Sodium, Ur: 66 mmol/L

## 2021-08-28 MED ORDER — CARBAMAZEPINE 200 MG PO TABS
200.0000 mg | ORAL_TABLET | Freq: Every day | ORAL | Status: DC
  Administered 2021-08-28 – 2021-09-13 (×16): 200 mg via ORAL
  Filled 2021-08-28 (×17): qty 1

## 2021-08-28 MED ORDER — MIRTAZAPINE 15 MG PO TABS
7.5000 mg | ORAL_TABLET | Freq: Every day | ORAL | Status: DC
  Administered 2021-08-28 – 2021-09-12 (×16): 7.5 mg via ORAL
  Filled 2021-08-28 (×16): qty 1

## 2021-08-28 MED ORDER — FUROSEMIDE 20 MG PO TABS
20.0000 mg | ORAL_TABLET | Freq: Every day | ORAL | Status: DC
  Administered 2021-08-28: 20 mg via ORAL
  Filled 2021-08-28: qty 1

## 2021-08-28 MED ORDER — ALLOPURINOL 100 MG PO TABS
100.0000 mg | ORAL_TABLET | Freq: Every day | ORAL | Status: DC
  Administered 2021-08-28 – 2021-09-13 (×17): 100 mg via ORAL
  Filled 2021-08-28 (×17): qty 1

## 2021-08-28 MED ORDER — ASPIRIN EC 81 MG PO TBEC
81.0000 mg | DELAYED_RELEASE_TABLET | Freq: Every day | ORAL | Status: DC
  Administered 2021-08-28 – 2021-09-13 (×17): 81 mg via ORAL
  Filled 2021-08-28 (×17): qty 1

## 2021-08-28 MED ORDER — AMLODIPINE BESYLATE 10 MG PO TABS
10.0000 mg | ORAL_TABLET | Freq: Every day | ORAL | Status: DC
  Administered 2021-08-28 – 2021-09-13 (×17): 10 mg via ORAL
  Filled 2021-08-28 (×17): qty 1

## 2021-08-28 MED ORDER — ADULT MULTIVITAMIN W/MINERALS CH
1.0000 | ORAL_TABLET | Freq: Every day | ORAL | Status: DC
  Administered 2021-08-28 – 2021-09-13 (×17): 1 via ORAL
  Filled 2021-08-28 (×16): qty 1

## 2021-08-28 MED ORDER — FOLIC ACID 1 MG PO TABS
1.0000 mg | ORAL_TABLET | Freq: Every day | ORAL | Status: DC
  Administered 2021-08-28 – 2021-09-13 (×17): 1 mg via ORAL
  Filled 2021-08-28 (×16): qty 1

## 2021-08-28 MED ORDER — SODIUM CHLORIDE 0.9 % IV BOLUS
1000.0000 mL | Freq: Once | INTRAVENOUS | Status: AC
  Administered 2021-08-28: 1000 mL via INTRAVENOUS

## 2021-08-28 MED ORDER — INSULIN GLARGINE-YFGN 100 UNIT/ML ~~LOC~~ SOLN
13.0000 [IU] | Freq: Every day | SUBCUTANEOUS | Status: DC
  Administered 2021-08-28: 13 [IU] via SUBCUTANEOUS
  Filled 2021-08-28 (×2): qty 0.13

## 2021-08-28 MED ORDER — LOSARTAN POTASSIUM 50 MG PO TABS
100.0000 mg | ORAL_TABLET | Freq: Every day | ORAL | Status: DC
  Administered 2021-08-28 – 2021-09-13 (×17): 100 mg via ORAL
  Filled 2021-08-28 (×17): qty 2

## 2021-08-28 MED ORDER — DULOXETINE HCL 60 MG PO CPEP
60.0000 mg | ORAL_CAPSULE | Freq: Every day | ORAL | Status: DC
  Administered 2021-08-28 – 2021-09-13 (×17): 60 mg via ORAL
  Filled 2021-08-28 (×17): qty 1

## 2021-08-28 MED ORDER — HYDRALAZINE HCL 25 MG PO TABS
25.0000 mg | ORAL_TABLET | Freq: Three times a day (TID) | ORAL | Status: DC | PRN
  Administered 2021-09-03: 25 mg via ORAL
  Filled 2021-08-28: qty 1

## 2021-08-28 MED ORDER — FAMOTIDINE 20 MG PO TABS
40.0000 mg | ORAL_TABLET | Freq: Every day | ORAL | Status: DC
  Administered 2021-08-28 – 2021-09-13 (×17): 40 mg via ORAL
  Filled 2021-08-28 (×17): qty 2

## 2021-08-28 MED ORDER — CLONAZEPAM 0.25 MG PO TBDP
1.0000 mg | ORAL_TABLET | Freq: Every day | ORAL | Status: DC
  Administered 2021-08-28 – 2021-09-01 (×5): 1 mg via ORAL
  Filled 2021-08-28 (×5): qty 4

## 2021-08-28 MED ORDER — SODIUM CHLORIDE 0.9% IV SOLUTION: Freq: Once | INTRAVENOUS | Status: AC

## 2021-08-28 MED ORDER — LACTATED RINGERS IV BOLUS: 1000.0000 mL | Freq: Once | INTRAVENOUS | Status: DC

## 2021-08-28 NOTE — Progress Notes (Signed)
Transport in room to take pt down to MRI again.  Pt refused stating that he is still recovering from his first scan this morning, his back is hurting.  Transport to let MRI know and to try again this evening.

## 2021-08-28 NOTE — Progress Notes (Signed)
Physical Therapy Treatment Patient Details Name: Leslie [name] MRN: [id number] DOB: 06/11/1946 Today's Date: 08/28/2021   History of Present Illness Pt is a 75 y.o. male admitted 08/18/21 with AMS and L knee pain; workup for L knee sepsis. S/p L knee aspiration, then L knee I&D on 10/24. PMH includes PAD s/p bilateral BKA (left 2017, right 2019), cellulitis, COPD, gout, arthritis, DM2.    PT Comments    Pt is progressing slowly with mobility. Sister brought L prosthetic today, but still waiting on the liner to work on stand pivot transfer. Today's session focused on transfers without prosthetics and UE theraband exercises. Performed lateral scoot transfer from bed to recliner with mod assist. Pt demonstrated multiple UE exercises he does at home and we provided several additional. Pt continues to express that he can do everything he needs to do once he has his leg on. Pt would benefit from further acute care PT to continue to progress functional mobility (especially once prosthetic can be used). Continue to recommend SNF after d/c to maximize independence.     Recommendations for follow up therapy are one component of a multi-disciplinary discharge planning process, led by the attending physician.  Recommendations may be updated based on patient status, additional functional criteria and insurance authorization.  Follow Up Recommendations  Skilled nursing-short term rehab (<3 hours/day)     Assistance Recommended at Discharge Frequent or constant Supervision/Assistance  Equipment Recommendations  None recommended by PT    Recommendations for Other Services       Precautions / Restrictions Precautions Precautions: Fall;Other (comment) Precaution Comments: chronic bilateral BKA (has prostheic but waiting on liner). significant h/o falls Restrictions Other Position/Activity Restrictions: S/p L knee aspiration, then L knee I&D on 10/24     Mobility  Bed Mobility Overal bed mobility:  Needs Assistance Bed Mobility: Supine to Sit     Supine to sit: Mod assist;HOB elevated     General bed mobility comments: modA to elevate trunk and scoot hips to EOB, use of rail for UE support    Transfers Overall transfer level: Needs assistance Equipment used: None Transfers: Lateral/Scoot Transfers            Lateral/Scoot Transfers: Mod assist General transfer comment: Lateral scoot to drop arm recliner on right with modA for stability and progression to recliner.    Ambulation/Gait                 Stairs             Wheelchair Mobility    Modified Rankin (Stroke Patients Only)       Balance Overall balance assessment: Needs assistance;History of Falls Sitting-balance support: No upper extremity supported;Feet unsupported Sitting balance-Leahy Scale: Good Sitting balance - Comments: Able to maintain static sitting EOB without BLE prosthetics donned, supervision for safety                                    Cognition Arousal/Alertness: Awake/alert Behavior During Therapy: WFL for tasks assessed/performed Overall Cognitive Status: No family/caregiver present to determine baseline cognitive functioning Area of Impairment: Attention;Memory;Following commands;Safety/judgement;Awareness                   Current Attention Level: Selective   Following Commands: Follows multi-step commands consistently;Follows multi-step commands with increased time Safety/Judgement: Decreased awareness of safety;Decreased awareness of deficits Awareness: Emergent Problem Solving: Slow processing General Comments: Pt continuing to express  that "once he has his leg on he can do anything he needs to do by himself" despite being in the hospital since 10/23.        Exercises Other Exercises Other Exercises: Gave pt orange theraband and went over some UE exercises he could do while he was waiting for his liner. Pt exercised with weights PTA and  was eager to demonstrate what he does. Pt demonstrated several exercises he did at home and we also provided several others (bicep curls, tricep ext, horizontal abduction). Also taught pt quad sets to work on knee extension.    General Comments General comments (skin integrity, edema, etc.): Pt has prosthetic but not liner.      Pertinent Vitals/Pain Pain Assessment: Faces Faces Pain Scale: Hurts a little bit Pain Location: L knee with mobility Pain Descriptors / Indicators: Discomfort    Home Living                          Prior Function            PT Goals (current goals can now be found in the care plan section) Acute Rehab PT Goals Patient Stated Goal: get back to his dog PT Goal Formulation: With patient Time For Goal Achievement: 09/07/21 Potential to Achieve Goals: Fair Progress towards PT goals: Progressing toward goals    Frequency    Min 3X/week      PT Plan Current plan remains appropriate    Co-evaluation              AM-PAC PT "6 Clicks" Mobility   Outcome Measure  Help needed turning from your back to your side while in a flat bed without using bedrails?: A Lot Help needed moving from lying on your back to sitting on the side of a flat bed without using bedrails?: A Lot Help needed moving to and from a bed to a chair (including a wheelchair)?: A Lot Help needed standing up from a chair using your arms (e.g., wheelchair or bedside chair)?: A Lot Help needed to walk in hospital room?: Total Help needed climbing 3-5 steps with a railing? : Total 6 Click Score: 10    End of Session   Activity Tolerance: Patient tolerated treatment well Patient left: with chair alarm set;in chair;with call bell/phone within reach Nurse Communication: Mobility status PT Visit Diagnosis: Muscle weakness (generalized) (M62.81);Other abnormalities of gait and mobility (R26.89);Repeated falls (R29.6);Pain Pain - Right/Left: Left Pain - part of body: Knee      Time: 1421-1445 PT Time Calculation (min) (ACUTE ONLY): 24 min  Charges:  $Therapeutic Exercise: 8-22 mins $Therapeutic Activity: 8-22 mins                     Brandon Melnick, SPT   Brandon Melnick 08/28/2021, 3:43 PM

## 2021-08-28 NOTE — Progress Notes (Addendum)
Subjective: I seen and evaluated Leslie [name] at bedside.  He states that he is able to tolerate diet.  Denies any nausea or vomiting.  Patient expresses odynophagia has been going on since admission.  He states that when he is drinking fluids slowly he does not feel any pain in the throat.  However, if he "eats too fast" he feels some tenderness in the middle of his throat.  Patient states he had a bowel movement today but is unsure of the color of the feces.  Patient denies fevers or chills.  Patient reports left knee tenderness and states moderate relief with pain medicine.   Objective:  Vital signs in last 24 hours: Vitals:   08/27/21 2042 08/28/21 0036 08/28/21 0342 08/28/21 0800  BP: 140/77 (!) 148/86 (!) 161/71 (!) 159/91  Pulse: 100 (!) 106 100 (!) 102  Resp: 18 16 18 20   Temp: 99 F (37.2 C) 98.7 F (37.1 C) 97.8 F (36.6 C) 97.9 F (36.6 C)  TempSrc: Oral Oral Oral Oral  SpO2: 100% 100% 100% 100%  Weight:   87.9 kg   Height:       Review of Systems  Constitutional:  Negative for chills, fever and malaise/fatigue.  HENT:  Positive for sore throat.   Respiratory:  Negative for shortness of breath.   Cardiovascular:  Negative for chest pain and palpitations.  Gastrointestinal:  Negative for abdominal pain, constipation, diarrhea, heartburn, nausea and vomiting.  Neurological:  Negative for dizziness, weakness and headaches.   Physical Exam HENT:     Head: Normocephalic and atraumatic.  Eyes:     General: Lids are normal.  Neck:     Thyroid: No thyroid mass, thyromegaly or thyroid tenderness.     Vascular: No JVD.  Cardiovascular:     Rate and Rhythm: Normal rate and regular rhythm.     Heart sounds: Normal heart sounds.  Pulmonary:     Effort: Pulmonary effort is normal.     Breath sounds: Normal breath sounds.  Abdominal:     General: Abdomen is flat.     Palpations: Abdomen is soft.  Genitourinary:    Comments: Foley present; hematuria noted in catheter   Musculoskeletal:     Comments: Left residual limb tenderness upon palpation; warmth, erythema and swelling noted on the medial side of the left knee     Right Lower Extremity: Right leg is amputated below knee.     Left Lower Extremity: Left leg is amputated below knee.  Lymphadenopathy:     Cervical: No cervical adenopathy.  Skin:    General: Skin is warm and dry.  Neurological:     General: No focal deficit present.     Mental Status: He is alert.  Psychiatric:        Attention and Perception: Attention normal.        Behavior: Behavior is cooperative.        Cognition and Memory: Cognition normal.     Assessment/Plan:  Principal Problem:   Septic arthritis of knee (HCC) Active Problems:   S/P BKA (below knee amputation) bilateral (HCC)   Acute kidney injury ([place])   Altered mental status   S/P debridement   MSSA (methicillin susceptible Staphylococcus aureus) infection   Cellulitis of left lower extremity   Pressure injury of skin  Septic Arthritis of left knee Patient reports persistent tenderness of the left residual limb.  On physical exam, worsening erythema, edema and warmth noted of the left residual limb.  Patient  remains afebrile.  Patient denies chills.  MRI of the left residual limb reveals no osteomyelitis; left knee effusion with Baker's cyst.  Low-grade fasciitis is not completely excluded. --PICC line in place; Cefazolin 2g Q8H- 6wks per ID recc --IV Dilaudid 0.5 mg every 4 hours for pain --Continue to monitor for acute signs of infection  Odynophagia Patient states he has been experiencing odynophagia for several days. It occurs intermittently when eating/drinking "too quickly". Patient recently underwent endoscopy on 08/25/21. Residual esophageal pain can occur following procedure. Patient denies choking, dysphagia or anorexia 2/2 discomfort. --Symptomatic relief --Monitor for worsening of condition  Hypovolemic hyponatremia Corrected Sodium- 131;  Patient is asymptomatic. Patient appeared dry with decreased skin turgor on physical exam. Patient currently on diuretic, lasix 20mg  daily; likely cause of renal losses. --Trend BMP --IV NS 1L  Acute Renal Failure Cr function continues to improve. Blood tinged urine noted in Foley likely due to injury during Foley placement. --Trend BMP   Upper GI bleed 2/2 duodenal ulcer Patient received 1 unit of blood yesterday and hemoglobin improved to 8.7.  Today patient's hemoglobin has been trending down currently at 7.9.  Patient had a bowel movement today and no report of melena. Patient is hemodynamically stable.  --Monitor H&H --CBC daily --Transfuse 1 unit of blood; consider IR consult  Type 2 diabetes with peripheral neuropathy -- Advance diet as tolerated -- Semglee 13 units at bedtime --SSI resistant; 3 units 3 times daily with meals --CBGs Q4H  Prior to Admission Living Arrangement: Anticipated Discharge Location: Barriers to Discharge: Dispo: Anticipated discharge in approximately 2-3 day(s).   Timothy Lasso, MD 08/28/2021, 2:14 PM Pager: [phone] After 5pm on weekdays and 1pm on weekends: On Call pager [phone]

## 2021-08-28 NOTE — Progress Notes (Signed)
Physical Therapy Treatment Patient Details Name: Leslie [name] MRN: [id number] DOB: 10-24-1946 Today's Date: 08/28/2021   History of Present Illness Pt is a 75 y.o. male admitted 08/18/21 with AMS and L knee pain; workup for L knee sepsis. S/p L knee aspiration, then L knee I&D on 10/24. Course complicated by encephalopathy. MRI L femur 11/2 shows no osteomyelitis; L knee effusion with Baker's cyst; pt declined L knee MRI. PMH includes PAD s/p bilateral BKA (left 2017, right 2019), cellulitis, COPD, gout, arthritis, DM2.   PT Comments    Pt seen for additional session for transfer from recliner, pt still without liner/sock to allow use of RLE prosthetic in room. Pt requiring modA for lateral scoot transfer at slight upward angle from drop arm recliner to bed; mod indep with bed-level mobility with use of rail. Discussed d/c recommendations - pt adamant about return home, not interested in post-acute rehab at SNF or HHPT services; pt reports good awareness of mobility strategies from prior rehab experience s/p prior amputations; also reports [facility] able to provide transportation home; treatment team notified of update in d/c plan. Will continue to follow acutely to address established goals.   HR up to 109 with activity Post-transfer BP 160/98    Recommendations for follow up therapy are one component of a multi-disciplinary discharge planning process, led by the attending physician.  Recommendations may be updated based on patient status, additional functional criteria and insurance authorization.  Follow Up Recommendations  Skilled nursing-short term rehab (<3 hours/day) - pt declining any follow-up PT     Assistance Recommended at Discharge Intermittent Supervision/Assistance  Equipment Recommendations  None recommended by PT    Recommendations for Other Services       Precautions / Restrictions Precautions Precautions: Fall;Other (comment) Precaution Comments: chronic bilateral BKA  (has RLE prosthetic in room, but no socks/liners); significant h/o falls Restrictions Weight Bearing Restrictions: Yes LLE Weight Bearing: Non weight bearing Other Position/Activity Restrictions: S/p L knee aspiration, then L knee I&D on 10/24     Mobility  Bed Mobility Overal bed mobility: Needs Assistance Bed Mobility: Rolling;Sit to Sidelying Rolling: Modified independent (Device/Increase time)   Supine to sit: Mod assist;HOB elevated   Sit to sidelying: Min assist General bed mobility comments: sit to sidelying with minA to prevent hip rolling from R-side of bed, rolling mod indep with use of bed rail    Transfers Overall transfer level: Needs assistance Equipment used: Bilateral platform walker Transfers: Lateral/Scoot Transfers            Lateral/Scoot Transfers: Mod assist General transfer comment: Pt with increased difficulty performing lateral scoot 'uphill' from recliner to bed, requiring modA for UE support to pull trunk; heavy reliance on other UE support to push; pt still does not have shrinker/socks in order to use RLE prosthetic for transfers    Ambulation/Gait                 Stairs             Wheelchair Mobility    Modified Rankin (Stroke Patients Only)       Balance Overall balance assessment: Needs assistance;History of Falls Sitting-balance support: No upper extremity supported;Feet unsupported Sitting balance-Leahy Scale: Fair Sitting balance - Comments: good static sitting at edge of recliner with intermittent UE rail support  Cognition Arousal/Alertness: Awake/alert Behavior During Therapy: WFL for tasks assessed/performed Overall Cognitive Status: No family/caregiver present to determine baseline cognitive functioning Area of Impairment: Attention;Following commands;Safety/judgement;Awareness                   Current Attention Level: Selective   Following  Commands: Follows one step commands consistently;Follows multi-step commands with increased time Safety/Judgement: Decreased awareness of safety;Decreased awareness of deficits Awareness: Emergent Problem Solving: Slow processing General Comments: Pt with good problem solving for transfer back to recliner, still some impulsivity with movement, preference to do certain things his way, continues to report "I'll be able to do anything I need to when I get my leg on... I'll be able to drive my scooter to pace if I need to"        Exercises Other Exercises Other Exercises: Gave pt orange theraband and went over some UE exercises he could do while he was waiting for his liner. Pt exercised with weights PTA and was eager to demonstrate what he does. Pt demonstrated several exercises he did at home and we also provided several others (bicep curls, tricep ext, horizontal abduction). Also taught pt quad sets to work on knee extension.    General Comments General comments (skin integrity, edema, etc.): Increased time discussing d/c plans - pt adamant against d/c to SNF, also not interested in HHPT; pt reports he knows what to do from prior acute/SNF/HHPT experiences s/p prior amputations      Pertinent Vitals/Pain Pain Assessment: Faces Faces Pain Scale: Hurts a little bit Pain Location: L knee with mobility Pain Descriptors / Indicators: Discomfort Pain Intervention(s): Monitored during session;Limited activity within patient's tolerance;Repositioned    Home Living                          Prior Function            PT Goals (current goals can now be found in the care plan section) Acute Rehab PT Goals Patient Stated Goal: get back to his dog PT Goal Formulation: With patient Time For Goal Achievement: 09/07/21 Potential to Achieve Goals: Fair Progress towards PT goals: Progressing toward goals    Frequency    Min 3X/week      PT Plan Discharge plan needs to be updated     Co-evaluation              AM-PAC PT "6 Clicks" Mobility   Outcome Measure  Help needed turning from your back to your side while in a flat bed without using bedrails?: A Little Help needed moving from lying on your back to sitting on the side of a flat bed without using bedrails?: A Lot Help needed moving to and from a bed to a chair (including a wheelchair)?: A Lot Help needed standing up from a chair using your arms (e.g., wheelchair or bedside chair)?: A Lot Help needed to walk in hospital room?: Total Help needed climbing 3-5 steps with a railing? : Total 6 Click Score: 11    End of Session   Activity Tolerance: Patient tolerated treatment well Patient left: in bed;with call bell/phone within reach;with bed alarm set Nurse Communication: Mobility status PT Visit Diagnosis: Muscle weakness (generalized) (M62.81);Other abnormalities of gait and mobility (R26.89);Repeated falls (R29.6);Pain Pain - Right/Left: Left Pain - part of body: Knee     Time: 6789-3810 PT Time Calculation (min) (ACUTE ONLY): 13 min  Charges:  $Therapeutic Activity: 8-22 mins  Mabeline Caras, PT, DPT Acute Rehabilitation Services  Pager [phone] Office [place] 08/28/2021, 4:30 PM

## 2021-08-28 NOTE — Progress Notes (Signed)
Attempted to bring pt down for MRI. Per transport pt does not want to come down for MRI at this time. Will attempt at later time.

## 2021-08-29 ENCOUNTER — Encounter (HOSPITAL_COMMUNITY): Payer: Self-pay | Admitting: Orthopedic Surgery

## 2021-08-29 ENCOUNTER — Inpatient Hospital Stay (HOSPITAL_COMMUNITY): Payer: Medicare (Managed Care) | Admitting: Certified Registered Nurse Anesthetist

## 2021-08-29 ENCOUNTER — Encounter (HOSPITAL_COMMUNITY)
Admission: EM | Disposition: A | Discharge status: Skilled Nursing Facility | Payer: Self-pay | Source: Home / Self Care | Attending: Internal Medicine

## 2021-08-29 ENCOUNTER — Inpatient Hospital Stay (HOSPITAL_COMMUNITY): Payer: Medicare (Managed Care)

## 2021-08-29 HISTORY — PX: I & D EXTREMITY: SHX5045

## 2021-08-29 LAB — RENAL FUNCTION PANEL
Albumin: 1.8 g/dL — ABNORMAL LOW (ref 3.5–5.0)
Anion gap: 8 (ref 5–15)
BUN: 25 mg/dL — ABNORMAL HIGH (ref 8–23)
CO2: 22 mmol/L (ref 22–32)
Calcium: 7.7 mg/dL — ABNORMAL LOW (ref 8.9–10.3)
Chloride: 101 mmol/L (ref 98–111)
Creatinine, Ser: 1.41 mg/dL — ABNORMAL HIGH (ref 0.61–1.24)
GFR, Estimated: 52 mL/min — ABNORMAL LOW (ref >60)
Glucose, Bld: 201 mg/dL — ABNORMAL HIGH (ref 70–99)
Phosphorus: 2.1 mg/dL — ABNORMAL LOW (ref 2.5–4.6)
Potassium: 3.9 mmol/L (ref 3.5–5.1)
Sodium: 131 mmol/L — ABNORMAL LOW (ref 135–145)

## 2021-08-29 LAB — CBC WITH DIFFERENTIAL/PLATELET
Abs Immature Granulocytes: 0.15 10*3/uL — ABNORMAL HIGH (ref 0.00–0.07)
Basophils Absolute: 0.1 10*3/uL (ref 0.0–0.1)
Basophils Relative: 0 %
Eosinophils Absolute: 0 10*3/uL (ref 0.0–0.5)
Eosinophils Relative: 0 %
HCT: 27.7 % — ABNORMAL LOW (ref 39.0–52.0)
Hemoglobin: 9.3 g/dL — ABNORMAL LOW (ref 13.0–17.0)
Immature Granulocytes: 1 %
Lymphocytes Relative: 8 %
Lymphs Abs: 1.5 10*3/uL (ref 0.7–4.0)
MCH: 29.4 pg (ref 26.0–34.0)
MCHC: 33.6 g/dL (ref 30.0–36.0)
MCV: 87.7 fL (ref 80.0–100.0)
Monocytes Absolute: 1.7 10*3/uL — ABNORMAL HIGH (ref 0.1–1.0)
Monocytes Relative: 9 %
Neutro Abs: 15.7 10*3/uL — ABNORMAL HIGH (ref 1.7–7.7)
Neutrophils Relative %: 82 %
Platelets: 253 10*3/uL (ref 150–400)
RBC: 3.16 MIL/uL — ABNORMAL LOW (ref 4.22–5.81)
RDW: 17.1 % — ABNORMAL HIGH (ref 11.5–15.5)
WBC: 19.2 10*3/uL — ABNORMAL HIGH (ref 4.0–10.5)
nRBC: 0 % (ref 0.0–0.2)

## 2021-08-29 LAB — TYPE AND SCREEN
ABO/RH(D): O NEG
Antibody Screen: NEGATIVE
Unit division: 0
Unit division: 0
Unit division: 0

## 2021-08-29 LAB — BPAM RBC
Blood Product Expiration Date: 202211092359
Blood Product Expiration Date: 202211162359
Blood Product Expiration Date: 202211212359
ISSUE DATE / TIME: 202210300426
ISSUE DATE / TIME: 202211011103
ISSUE DATE / TIME: 202211021705
Unit Type and Rh: 9500
Unit Type and Rh: 9500
Unit Type and Rh: 9500

## 2021-08-29 LAB — GLUCOSE, CAPILLARY
Glucose-Capillary: 189 mg/dL — ABNORMAL HIGH (ref 70–99)
Glucose-Capillary: 207 mg/dL — ABNORMAL HIGH (ref 70–99)
Glucose-Capillary: 128 mg/dL — ABNORMAL HIGH (ref 70–99)
Glucose-Capillary: 188 mg/dL — ABNORMAL HIGH (ref 70–99)
Glucose-Capillary: 153 mg/dL — ABNORMAL HIGH (ref 70–99)

## 2021-08-29 LAB — FOLATE: Folate: 6.9 ng/mL (ref >5.9)

## 2021-08-29 SURGERY — IRRIGATION AND DEBRIDEMENT EXTREMITY
Anesthesia: General | Site: Knee | Laterality: Left

## 2021-08-29 MED ORDER — ONDANSETRON HCL 4 MG PO TABS: 4.0000 mg | ORAL_TABLET | Freq: Four times a day (QID) | ORAL | Status: DC | PRN

## 2021-08-29 MED ORDER — INSULIN GLARGINE-YFGN 100 UNIT/ML ~~LOC~~ SOLN
20.0000 [IU] | Freq: Every day | SUBCUTANEOUS | Status: DC
  Filled 2021-08-29: qty 0.2

## 2021-08-29 MED ORDER — INSULIN GLARGINE-YFGN 100 UNIT/ML ~~LOC~~ SOLN
15.0000 [IU] | Freq: Every day | SUBCUTANEOUS | Status: DC
  Administered 2021-08-30 – 2021-09-12 (×13): 15 [IU] via SUBCUTANEOUS
  Filled 2021-08-29 (×17): qty 0.15

## 2021-08-29 MED ORDER — ONDANSETRON HCL 4 MG/2ML IJ SOLN: 4.0000 mg | Freq: Once | INTRAMUSCULAR | Status: DC | PRN

## 2021-08-29 MED ORDER — ONDANSETRON HCL 4 MG/2ML IJ SOLN: 4.0000 mg | Freq: Four times a day (QID) | INTRAMUSCULAR | Status: DC | PRN

## 2021-08-29 MED ORDER — POVIDONE-IODINE 10 % EX SWAB: 2.0000 "application " | Freq: Once | CUTANEOUS | Status: DC

## 2021-08-29 MED ORDER — FENTANYL CITRATE (PF) 100 MCG/2ML IJ SOLN
INTRAMUSCULAR | Status: DC | PRN
  Administered 2021-08-29 (×3): 50 ug via INTRAVENOUS

## 2021-08-29 MED ORDER — HYDROMORPHONE HCL 1 MG/ML IJ SOLN
1.0000 mg | INTRAMUSCULAR | Status: DC | PRN
  Administered 2021-08-30 – 2021-09-11 (×15): 1 mg via INTRAVENOUS
  Filled 2021-08-29 (×15): qty 1

## 2021-08-29 MED ORDER — SODIUM CHLORIDE 0.9 % IR SOLN
Status: DC | PRN
  Administered 2021-08-29 (×2): 3000 mL

## 2021-08-29 MED ORDER — CHLORHEXIDINE GLUCONATE 4 % EX LIQD
60.0000 mL | Freq: Once | CUTANEOUS | Status: DC
  Filled 2021-08-29: qty 15

## 2021-08-29 MED ORDER — INSULIN ASPART 100 UNIT/ML IJ SOLN
3.0000 [IU] | Freq: Three times a day (TID) | INTRAMUSCULAR | Status: DC
  Administered 2021-08-31 – 2021-09-13 (×21): 3 [IU] via SUBCUTANEOUS

## 2021-08-29 MED ORDER — ONDANSETRON HCL 4 MG/2ML IJ SOLN
INTRAMUSCULAR | Status: DC | PRN
  Administered 2021-08-29: 4 mg via INTRAVENOUS

## 2021-08-29 MED ORDER — SODIUM CHLORIDE 0.9 % IR SOLN
Status: DC | PRN
  Administered 2021-08-29: 1000 mL

## 2021-08-29 MED ORDER — PROPOFOL 10 MG/ML IV BOLUS
INTRAVENOUS | Status: DC | PRN
  Administered 2021-08-29: 100 mg via INTRAVENOUS

## 2021-08-29 MED ORDER — STERILE WATER FOR IRRIGATION IR SOLN
Status: DC | PRN
  Administered 2021-08-29: 300 mL

## 2021-08-29 MED ORDER — METOCLOPRAMIDE HCL 5 MG/ML IJ SOLN: 5.0000 mg | Freq: Three times a day (TID) | INTRAMUSCULAR | Status: DC | PRN

## 2021-08-29 MED ORDER — DOCUSATE SODIUM 100 MG PO CAPS
100.0000 mg | ORAL_CAPSULE | Freq: Two times a day (BID) | ORAL | Status: DC
  Administered 2021-08-30 – 2021-09-13 (×27): 100 mg via ORAL
  Filled 2021-08-29 (×29): qty 1

## 2021-08-29 MED ORDER — METOCLOPRAMIDE HCL 5 MG PO TABS: 5.0000 mg | ORAL_TABLET | Freq: Three times a day (TID) | ORAL | Status: DC | PRN

## 2021-08-29 MED ORDER — ENSURE ENLIVE PO LIQD
237.0000 mL | Freq: Three times a day (TID) | ORAL | Status: DC
Start: 2021-08-29 — End: 2021-09-14
  Administered 2021-08-29 – 2021-09-12 (×22): 237 mL via ORAL

## 2021-08-29 MED ORDER — CEFAZOLIN SODIUM 1 G IJ SOLR
INTRAMUSCULAR | Status: AC
  Filled 2021-08-29: qty 20

## 2021-08-29 MED ORDER — PROPOFOL 10 MG/ML IV BOLUS
INTRAVENOUS | Status: AC
  Filled 2021-08-29: qty 20

## 2021-08-29 MED ORDER — FENTANYL CITRATE (PF) 250 MCG/5ML IJ SOLN
INTRAMUSCULAR | Status: AC
  Filled 2021-08-29: qty 5

## 2021-08-29 MED ORDER — ACETAMINOPHEN 10 MG/ML IV SOLN
INTRAVENOUS | Status: DC | PRN
  Administered 2021-08-29: 1000 mg via INTRAVENOUS

## 2021-08-29 MED ORDER — ACETAMINOPHEN 10 MG/ML IV SOLN
INTRAVENOUS | Status: AC
  Filled 2021-08-29: qty 100

## 2021-08-29 MED ORDER — FENTANYL CITRATE (PF) 100 MCG/2ML IJ SOLN
INTRAMUSCULAR | Status: AC
  Filled 2021-08-29: qty 2

## 2021-08-29 MED ORDER — FENTANYL CITRATE (PF) 100 MCG/2ML IJ SOLN
25.0000 ug | INTRAMUSCULAR | Status: DC | PRN
  Administered 2021-08-29 (×2): 50 ug via INTRAVENOUS

## 2021-08-29 MED ORDER — LIDOCAINE 2% (20 MG/ML) 5 ML SYRINGE
INTRAMUSCULAR | Status: AC
  Filled 2021-08-29: qty 5

## 2021-08-29 MED ORDER — LACTATED RINGERS IV SOLN: INTRAVENOUS | Status: DC | PRN

## 2021-08-29 MED ORDER — SUCCINYLCHOLINE CHLORIDE 200 MG/10ML IV SOSY
PREFILLED_SYRINGE | INTRAVENOUS | Status: DC | PRN
  Administered 2021-08-29: 100 mg via INTRAVENOUS

## 2021-08-29 MED ORDER — PHENYLEPHRINE HCL (PRESSORS) 10 MG/ML IV SOLN
INTRAVENOUS | Status: DC | PRN
  Administered 2021-08-29: 80 ug via INTRAVENOUS

## 2021-08-29 MED ORDER — LIDOCAINE HCL (CARDIAC) PF 100 MG/5ML IV SOSY
PREFILLED_SYRINGE | INTRAVENOUS | Status: DC | PRN
  Administered 2021-08-29: 80 mg via INTRAVENOUS

## 2021-08-29 SURGICAL SUPPLY — 69 items
BAG COUNTER SPONGE SURGICOUNT (BAG) ×1 IMPLANT
BAG SPNG CNTER NS LX DISP (BAG)
BAG SURGICOUNT SPONGE COUNTING (BAG)
BNDG ELASTIC 4X5.8 VLCR STR LF (GAUZE/BANDAGES/DRESSINGS) ×1 IMPLANT
BNDG ELASTIC 6X5.8 VLCR STR LF (GAUZE/BANDAGES/DRESSINGS) ×2 IMPLANT
BNDG GAUZE ELAST 4 BULKY (GAUZE/BANDAGES/DRESSINGS) ×3 IMPLANT
CNTNR URN SCR LID CUP LEK RST (MISCELLANEOUS) IMPLANT
CONT SPEC 4OZ STRL OR WHT (MISCELLANEOUS) ×3
CUFF TOURN SGL QUICK 18X4 (TOURNIQUET CUFF) ×1 IMPLANT
CUFF TOURN SGL QUICK 24 (TOURNIQUET CUFF)
CUFF TOURN SGL QUICK 34 (TOURNIQUET CUFF) ×3
CUFF TOURN SGL QUICK 42 (TOURNIQUET CUFF) IMPLANT
CUFF TRNQT CYL 24X4X16.5-23 (TOURNIQUET CUFF) IMPLANT
CUFF TRNQT CYL 34X4.125X (TOURNIQUET CUFF) IMPLANT
DRAPE SURG 17X23 STRL (DRAPES) ×1 IMPLANT
DRAPE U-SHAPE 47X51 STRL (DRAPES) ×3 IMPLANT
DRSG EMULSION OIL 3X3 NADH (GAUZE/BANDAGES/DRESSINGS) ×1 IMPLANT
DRSG PAD ABDOMINAL 8X10 ST (GAUZE/BANDAGES/DRESSINGS) ×5 IMPLANT
DURAPREP 26ML APPLICATOR (WOUND CARE) ×2 IMPLANT
ELECT CAUTERY BLADE 6.4 (BLADE) ×2 IMPLANT
ELECT REM PT RETURN 9FT ADLT (ELECTROSURGICAL) ×3
ELECTRODE REM PT RTRN 9FT ADLT (ELECTROSURGICAL) IMPLANT
EVACUATOR 1/8 PVC DRAIN (DRAIN) ×4 IMPLANT
FACESHIELD WRAPAROUND (MASK) IMPLANT
FACESHIELD WRAPAROUND OR TEAM (MASK) ×1 IMPLANT
GAUZE SPONGE 4X4 12PLY STRL (GAUZE/BANDAGES/DRESSINGS) ×1 IMPLANT
GAUZE SPONGE 4X4 12PLY STRL LF (GAUZE/BANDAGES/DRESSINGS) ×2 IMPLANT
GAUZE XEROFORM 5X9 LF (GAUZE/BANDAGES/DRESSINGS) ×2 IMPLANT
GLOVE SRG 8 PF TXTR STRL LF DI (GLOVE) IMPLANT
GLOVE SURG ENC MOIS LTX SZ6 (GLOVE) ×3 IMPLANT
GLOVE SURG ORTHO LTX SZ7.5 (GLOVE) ×3 IMPLANT
GLOVE SURG ORTHO LTX SZ8 (GLOVE) ×3 IMPLANT
GLOVE SURG POLYISO LF SZ7.5 (GLOVE) ×4 IMPLANT
GLOVE SURG UNDER POLY LF SZ6.5 (GLOVE) ×3 IMPLANT
GLOVE SURG UNDER POLY LF SZ7.5 (GLOVE) ×5 IMPLANT
GLOVE SURG UNDER POLY LF SZ8 (GLOVE) ×3
GOWN STRL REIN 3XL XLG LVL4 (GOWN DISPOSABLE) ×3 IMPLANT
GOWN STRL REUS W/ TWL LRG LVL3 (GOWN DISPOSABLE) ×3 IMPLANT
GOWN STRL REUS W/TWL 2XL LVL3 (GOWN DISPOSABLE) ×2 IMPLANT
GOWN STRL REUS W/TWL LRG LVL3 (GOWN DISPOSABLE) ×9
HANDPIECE INTERPULSE COAX TIP (DISPOSABLE) ×3
KIT BASIN OR (CUSTOM PROCEDURE TRAY) ×3 IMPLANT
KIT TURNOVER KIT B (KITS) ×3 IMPLANT
MANIFOLD NEPTUNE II (INSTRUMENTS) ×3 IMPLANT
NS IRRIG 1000ML POUR BTL (IV SOLUTION) ×3 IMPLANT
PACK ORTHO EXTREMITY (CUSTOM PROCEDURE TRAY) ×3 IMPLANT
PAD ARMBOARD 7.5X6 YLW CONV (MISCELLANEOUS) ×4 IMPLANT
PAD CAST 4YDX4 CTTN HI CHSV (CAST SUPPLIES) IMPLANT
PADDING CAST COTTON 4X4 STRL (CAST SUPPLIES) ×3
SET HNDPC FAN SPRY TIP SCT (DISPOSABLE) IMPLANT
SPONGE T-LAP 18X18 ~~LOC~~+RFID (SPONGE) ×5 IMPLANT
SPONGE T-LAP 4X18 ~~LOC~~+RFID (SPONGE) ×1 IMPLANT
STAPLER VISISTAT 35W (STAPLE) ×2 IMPLANT
STOCKINETTE IMPERVIOUS 9X36 MD (GAUZE/BANDAGES/DRESSINGS) ×1 IMPLANT
SUCTION FRAZIER HANDLE 10FR (MISCELLANEOUS)
SUCTION TUBE FRAZIER 10FR DISP (MISCELLANEOUS) IMPLANT
SUT ETHILON 3 0 PS 1 (SUTURE) ×1 IMPLANT
SUT SILK 2 0 PERMA HAND 18 BK (SUTURE) ×1 IMPLANT
SUT VIC AB 2-0 CTB1 (SUTURE) ×2 IMPLANT
SUT VLOC 180 0 24IN GS25 (SUTURE) ×2 IMPLANT
SWAB CULTURE ESWAB REG 1ML (MISCELLANEOUS) IMPLANT
SYR CONTROL 10ML LL (SYRINGE) ×2 IMPLANT
TOWEL GREEN STERILE (TOWEL DISPOSABLE) ×3 IMPLANT
TOWEL GREEN STERILE FF (TOWEL DISPOSABLE) ×3 IMPLANT
TUBE CONNECTING 12'X1/4 (SUCTIONS) ×1
TUBE CONNECTING 12X1/4 (SUCTIONS) ×2 IMPLANT
UNDERPAD 30X36 HEAVY ABSORB (UNDERPADS AND DIAPERS) ×3 IMPLANT
WATER STERILE IRR 1000ML POUR (IV SOLUTION) ×3 IMPLANT
YANKAUER SUCT BULB TIP NO VENT (SUCTIONS) ×3 IMPLANT

## 2021-08-29 NOTE — Progress Notes (Signed)
Subjective: I seen and evaluated Mr. Casanas at bedside.  He was restless in bed.  He states he still has tenderness in his left knee.  He denies subjective fevers, chills, chest pain, shortness of breath, abdominal pain, or nausea/vomiting.  According to the nurse, patient is still having melena with 2-3 bouts in the early morning.  INTERIM: Patient was seen this afternoon.  He appeared restless and delirious in bed.  He was mumbling to himself and reaching his arms out in front of him as if he was grasping something. He appeared diaphoretic and felt warm.  Objective:  Vital signs in last 24 hours: Vitals:   08/29/21 0455 08/29/21 0800 08/29/21 0836 08/29/21 1130  BP: (!) 148/88 (!) 125/110 (!) 156/85 (!) 156/72  Pulse: (!) 110 (!) 115  (!) 111  Resp: 20 (!) 21  20  Temp:  98.3 F (36.8 C)  98.5 F (36.9 C)  TempSrc:  Oral  Oral  SpO2: 100% 95%  100%  Weight: 90.4 kg     Height:       Review of Systems  Constitutional:  Negative for chills and fever.  Eyes:  Negative for blurred vision.  Respiratory:  Negative for shortness of breath.   Cardiovascular:  Negative for chest pain and palpitations.  Gastrointestinal:  Positive for melena. Negative for abdominal pain, constipation, diarrhea, nausea and vomiting.  Musculoskeletal:  Positive for joint pain.  Neurological:  Negative for dizziness, weakness and headaches.   Physical Exam Constitutional:      General: He is awake.     Appearance: He is ill-appearing.     Comments: Patient appeared uncomfortable and restless in bed  HENT:     Head: Normocephalic and atraumatic.  Eyes:     General: Lids are normal.  Cardiovascular:     Rate and Rhythm: Normal rate.     Heart sounds: Normal heart sounds.  Pulmonary:     Effort: Pulmonary effort is normal.  Musculoskeletal:     Comments: Left residual limb medial aspect of tibia is edematous, red and tender to touch. Drainage noted     Right Lower Extremity: Right leg is amputated  below knee.     Left Lower Extremity: Left leg is amputated below knee.  Skin:    General: Skin is warm and moist.      Assessment/Plan:  Principal Problem:   Septic arthritis of knee (HCC) Active Problems:   S/P BKA (below knee amputation) bilateral (HCC)   Acute kidney injury ([place])   Altered mental status   S/P debridement   MSSA (methicillin susceptible Staphylococcus aureus) infection   Cellulitis of left lower extremity   Pressure injury of skin  Septic arthritis of the left knee Patient reports persistent tenderness of the left residual limb.  On physical exam there is worsening erythema, edema and tender to palpation of the left knee.  Patient was febrile at 100.7 overnight which has since resolved with antipyretic. This morning patient is afebrile and restless. Patient appears ill.  MRI of the left tibia reveals no sign of osteomyelitis; fluid collection consistent with large Baker's cyst; no gas seen in the soft tissue. -- PICC line in place; cefazolin 2 g every 8 hours-6 weeks per ID recc -- IV Dilaudid increased to 1 mg from 0.5 every 4 hours as needed --Ortho consulted  Hypovolemic hyponatremia Sodium levels are starting to improve; patient is asymptomatic. --Lasix Dc'd --Continuous IV fluid  Acute renal failure Creatinine function continues to improve.  Urine in Foley is no longer blood-tinged. --Trend BMP  Upper GI bleed 2/2 duodenal ulcer Patient has received 1 unit of blood yesterday and his hemoglobin improved to 9.6 today.  However, according to the nurse patient is still experiencing melena. --Trend CBC  Acute renal failure Creatinine function continues to improve. --Trend BMP --Continues IV fluid  Delirium 2/2 infection CBC reveals leukocytosis at 19.1.  Patient has been experiencing tenderness of the left knee. On PE, left knee is edematous, red and tender to touch with purulent drainage.  Patient was also noted to have cloudy urine in the Foley.   Patient was febrile overnight which resolved with antipyretic.  Currently, during interview patient was mumbling to himself, restless, and reaching for objects that are not there. --Ortho consulted for left knee source of infection --Tylenol as needed --Continuous IV fluid   Prior to Admission Living Arrangement: Anticipated Discharge Location: Barriers to Discharge: Dispo: Anticipated discharge in approximately 2-3 day(s).   Timothy Lasso, MD 08/29/2021, 12:46 PM Pager: [phone] After 5pm on weekdays and 1pm on weekends: On Call pager [phone]

## 2021-08-29 NOTE — TOC Progression Note (Signed)
Transition of Care (TOC) - Progression Note    Patient Details  Name: Leslie [name] MRN: [id number] Date of Birth: 04/19/1946  Transition of Care (TOC) CM/SW Contact   N , LCSW Phone Number: 08/29/2021, 1:55 PM  Clinical Narrative:     CSW spoke with patient's [facility] SW Janet- informed [facility] has made offer-  CSW met with the patient at bedside- informed he has been accepted at [facility] and [facility] SW Janet has been updated.    , MSW, LCSW Clinical Social Worker    Expected Discharge Plan: Skilled Nursing Facility Barriers to Discharge: Continued Medical Work up, SNF Pending bed offer  Expected Discharge Plan and Services Expected Discharge Plan: Skilled Nursing Facility       Living arrangements for the past 2 months: Single Family Home                                       Social Determinants of Health (SDOH) Interventions    Readmission Risk Interventions No flowsheet data found.  

## 2021-08-29 NOTE — Progress Notes (Signed)
Patient ID: Leslie [name], male   DOB: [birth year], 75 y.o.   MRN: [id number] Subjective: 4 Days Post-Op Procedure(s) (LRB): ESOPHAGOGASTRODUODENOSCOPY (EGD) WITH PROPOFOL (N/A) SCLEROTHERAPY HOT HEMOSTASIS (ARGON PLASMA COAGULATION/BICAP) BIOPSY    Patient reports pain as moderate in the left LE  Objective:   VITALS:   Vitals:   08/29/21 1130 08/29/21 1633  BP: (!) 156/72 (!) 133/56  Pulse: (!) 111 (!) 108  Resp: 20 20  Temp: 98.5 F (36.9 C) 99.9 F (37.7 C)  SpO2: 100% 100%    Exam: Melena appearing stool occurring regularly including this evening  Left knee: Purulent drainage from inferior aspect of wound    LABS Recent Labs    08/27/21 0139 08/27/21 1607 08/28/21 0648 08/28/21 2251 08/29/21 0515  HGB 7.6*   < > 7.9* 9.1* 9.3*  HCT 24.2*   < > 23.9* 26.9* 27.7*  WBC 17.2*  --  16.8*  --  19.2*  PLT 279  --  249  --  253   < > = values in this interval not displayed.    Recent Labs    08/27/21 0139 08/28/21 0130 08/29/21 0515  NA 136 128* 131*  K 4.8 4.2 3.9  BUN 50* 38* 25*  CREATININE 1.71* 1.51* 1.41*  GLUCOSE 224* 206* 201*    No results for input(s): LABPT, INR in the last 72 hours.   Assessment/Plan: Persistent left septic knee   Plan: NPO I am going to take him back to the OR tonight to repeat I&D of this left knee to try and help reduce infection burden so antibiotics will effective treat the infection Consent ordered He hasn't eaten since lunch

## 2021-08-29 NOTE — H&P (View-Only) (Signed)
Patient ID: Leslie [name], male   DOB: [birth year], 75 y.o.   MRN: [id number] Subjective: 4 Days Post-Op Procedure(s) (LRB): ESOPHAGOGASTRODUODENOSCOPY (EGD) WITH PROPOFOL (N/A) SCLEROTHERAPY HOT HEMOSTASIS (ARGON PLASMA COAGULATION/BICAP) BIOPSY    Patient reports pain as moderate in the left LE  Objective:   VITALS:   Vitals:   08/29/21 1130 08/29/21 1633  BP: (!) 156/72 (!) 133/56  Pulse: (!) 111 (!) 108  Resp: 20 20  Temp: 98.5 F (36.9 C) 99.9 F (37.7 C)  SpO2: 100% 100%    Exam: Melena appearing stool occurring regularly including this evening  Left knee: Purulent drainage from inferior aspect of wound    LABS Recent Labs    08/27/21 0139 08/27/21 1607 08/28/21 0648 08/28/21 2251 08/29/21 0515  HGB 7.6*   < > 7.9* 9.1* 9.3*  HCT 24.2*   < > 23.9* 26.9* 27.7*  WBC 17.2*  --  16.8*  --  19.2*  PLT 279  --  249  --  253   < > = values in this interval not displayed.    Recent Labs    08/27/21 0139 08/28/21 0130 08/29/21 0515  NA 136 128* 131*  K 4.8 4.2 3.9  BUN 50* 38* 25*  CREATININE 1.71* 1.51* 1.41*  GLUCOSE 224* 206* 201*    No results for input(s): LABPT, INR in the last 72 hours.   Assessment/Plan: Persistent left septic knee   Plan: NPO I am going to take him back to the OR tonight to repeat I&D of this left knee to try and help reduce infection burden so antibiotics will effective treat the infection Consent ordered He hasn't eaten since lunch

## 2021-08-29 NOTE — Consult Note (Signed)
WOC Nurse Consult Note: Patient receiving care in [place]. Reason for Consult: left knee wound Wound type: Orthopedic surgery was involved in the surgical management of this wound on 08/20/21; Dr. Sharol Given saw the patient on 08/24/21, and an Orthopedic PA has seen the patient.    I spoke with Dr. Ileene Musa over the phone regarding the order placed by her MD teammate, Agreeg Rehman at [phone] this morning, and explained that concerns related to the surgical site really should go to the Orthopedic surgeons. She will reach out to the surgeons as needed.  The [place] nurse did not see the patient. Please reconsult if needed.  Val Riles, RN, MSN, CWOCN, CNS-BC, pager [phone]

## 2021-08-29 NOTE — Anesthesia Procedure Notes (Signed)
Procedure Name: Intubation Date/Time: 08/29/2021 8:45 PM Performed by: Amrutha Avera T, CRNA Pre-anesthesia Checklist: Patient identified, Emergency Drugs available, Suction available and Patient being monitored Patient Re-evaluated:Patient Re-evaluated prior to induction Oxygen Delivery Method: Circle system utilized Preoxygenation: Pre-oxygenation with 100% oxygen Induction Type: IV induction and Rapid sequence Ventilation: Mask ventilation without difficulty Laryngoscope Size: Miller and 3 Grade View: Grade I Tube type: Oral Tube size: 7.5 mm Number of attempts: 1 Airway Equipment and Method: Stylet and Oral airway Placement Confirmation: ETT inserted through vocal cords under direct vision, positive ETCO2 and breath sounds checked- equal and bilateral Secured at: 22 cm Tube secured with: Tape Dental Injury: Teeth and Oropharynx as per pre-operative assessment

## 2021-08-29 NOTE — Interval H&P Note (Signed)
History and Physical Interval Note:  08/29/2021 8:41 PM  Leslie [name]  has presented today for surgery, with the diagnosis of Left Knee Infection.  The various methods of treatment have been discussed with the patient and family. After consideration of risks, benefits and other options for treatment, the patient has consented to  Procedure(s): IRRIGATION AND DEBRIDEMENT Left Knee (Left) as a surgical intervention.  The patient's history has been reviewed, patient examined, no change in status, stable for surgery.  I have reviewed the patient's chart and labs.  Questions were answered to the patient's satisfaction.     Mauri Pole

## 2021-08-29 NOTE — Plan of Care (Signed)
  Problem: Clinical Measurements: Goal: Will remain free from infection Outcome: Progressing   Problem: Nutrition: Goal: Adequate nutrition will be maintained Outcome: Progressing   Problem: Elimination: Goal: Will not experience complications related to bowel motility Outcome: Progressing

## 2021-08-29 NOTE — Care Management Important Message (Signed)
Important Message  Patient Details  Name: TRESTIN VENCES MRN: [id number] Date of Birth: May 09, 1946   Medicare Important Message Given:  Yes     Shelda Altes 08/29/2021, 10:43 AM

## 2021-08-29 NOTE — Transfer of Care (Signed)
Immediate Anesthesia Transfer of Care Note  Patient: Leslie [name]  Procedure(s) Performed: IRRIGATION AND DEBRIDEMENT Left Knee (Left)  Patient Location: PACU  Anesthesia Type:General  Level of Consciousness: drowsy  Airway & Oxygen Therapy: Patient Spontanous Breathing and Patient connected to nasal cannula oxygen  Post-op Assessment: Report given to RN, Post -op Vital signs reviewed and stable and Patient moving all extremities  Post vital signs: Reviewed and stable  Last Vitals:  Vitals Value Taken Time  BP 120/73 08/29/21 2210  Temp    Pulse 106 08/29/21 2210  Resp 17 08/29/21 2210  SpO2 98 % 08/29/21 2210  Vitals shown include unvalidated device data.  Last Pain:  Vitals:   08/29/21 2018  TempSrc: Oral  PainSc:       Patients Stated Pain Goal: 0 (20/23/34 3568)  Complications: No notable events documented.

## 2021-08-29 NOTE — Progress Notes (Signed)
Inpatient Diabetes Program Recommendations  AACE/ADA: New Consensus Statement on Inpatient Glycemic Control   Target Ranges:  Prepandial:   less than 140 mg/dL      Peak postprandial:   less than 180 mg/dL (1-2 hours)      Critically ill patients:  140 - 180 mg/dL   Results for ARYN, KOPS (MRN [id number]) as of 08/29/2021 11:23  Ref. Range 08/28/2021 06:16 08/28/2021 16:53 08/28/2021 21:23 08/29/2021 06:16  Glucose-Capillary Latest Ref Range: 70 - 99 mg/dL 231 (H) 252 (H) 273 (H) 189 (H)    Review of Glycemic Control  Diabetes history: DM2 Outpatient Diabetes medications: Lantus 75 units daily, Metformin 850 mg BID Current orders for Inpatient glycemic control: Semglee 20 units QHS, Novolog 0-15 units TID with meals   Inpatient Diabetes Program Recommendations:     Insulin: Patient received Semglee 13 units on 11/2 and fasting glucose 189 mg/dl today.  Please consider decreasing Semglee to 15 units QHS, and ordering Novolog 3 units TID with meals for meal coverage if patient eats at least 50% of meals.   Thanks Barnie Alderman, RN, MSN, CDE Diabetes Coordinator Inpatient Diabetes Program [phone] (Team Pager from 8am to 5pm)

## 2021-08-29 NOTE — Progress Notes (Signed)
Initial Nutrition Assessment  DOCUMENTATION CODES:   Not applicable  INTERVENTION:   Ensure Enlive po TID, each supplement provides 350 kcal and 20 grams of protein. Check vitamin A, vitamin C, zinc, folate, and vitamin B-6 levels as patient is at risk for deficiencies which could impede wound healing.   NUTRITION DIAGNOSIS:   Inadequate oral intake related to dysphagia as evidenced by NPO status.  GOAL:   Patient will meet greater than or equal to 90% of their needs  MONITOR:   TF tolerance, Labs, Diet advancement  REASON FOR ASSESSMENT:   Consult Enteral/tube feeding initiation and management  ASSESSMENT:   75 yo male admitted with septic L knee, acute encephalopathy c/b AKI. PMH includes HTN, HLD, DM-2, COPD, bilateral BKAs, alcohol use disorder, PAD, hepatitis.  S/P I&D of left knee on 10/24. Now with worsening redness and swelling, questionable abscess. Cortrak was removed and TF discontinued 10/28. SLP signed off 10/29, recommending regular diet with thin liquids. Currently on full liquids d/t GI issues.  S/P upper GI endoscopy 10/30 for melena, coffee ground emesis. Found to have severe esophagitis, non-bleeding gastric ulcers, large duodenal ulcer with active oozing. Hemostasis was not attempted d/t concern of creating catastrophic bleeding.   Labs reviewed. Na 131, phos 2.1 CBG: 252-273-189  Medications reviewed and include Pepcid, folic acid, novolog, Semglee, Remeron, MVI with minerals, Protonix, sucralfate, thiamine.  Admission weight 90.1 kg Current weight 90.4 kg  Patient is at risk for multiple vitamin/mineral deficiencies given history of ETOH abuse. Discussed with attending physician team. Will check levels and supplement as needed.  Diet Order:   Diet Order             Diet full liquid Room service appropriate? Yes with Assist; Fluid consistency: Thin  Diet effective now                   EDUCATION NEEDS:   Not appropriate for education  at this time  Skin:  Skin Assessment: Skin Integrity Issues: Skin Integrity Issues:: Stage II, Other (Comment) Stage II: sacrum Other: L leg wound at BKA site  Last BM:  11/3 type 7  Height:   Ht Readings from Last 1 Encounters:  08/24/21 5\' 6"  (1.676 m)    Weight:   Wt Readings from Last 1 Encounters:  08/29/21 90.4 kg    BMI:  Body mass index is 35.97 kg/m (adjusted for bilateral BKAs)  Estimated Nutritional Needs:   Kcal:  1900-2100  Protein:  105-125 gm  Fluid:  >/= 2.1 L    Lucas Mallow, RD, LDN, CNSC Please refer to Amion for contact information.

## 2021-08-29 NOTE — Brief Op Note (Signed)
08/18/2021 - 08/29/2021  8:41 PM  PATIENT:  Leslie [name]  75 y.o. male  PRE-OPERATIVE DIAGNOSIS:  Persistent left knee infection  POST-OPERATIVE DIAGNOSIS:  Persistent left knee infection  PROCEDURE:  Procedure(s): IRRIGATION AND DEBRIDEMENT Left Knee (Left)  SURGEON:  Surgeon(s) and Role:    Paralee Cancel, MD - Primary  PHYSICIAN ASSISTANT: None  ANESTHESIA:   general  EBL:  <50 cc  BLOOD ADMINISTERED:none  DRAINS: 2 (medium) Hemovact drain(s) in the left knee with  Suction Open.  (A) drain within knee suprapatellar pouch, (B) placed in plane medial to knee down to stump   LOCAL MEDICATIONS USED:  NONE  SPECIMEN:  No Specimen  DISPOSITION OF SPECIMEN:  N/A  COUNTS:  YES  TOURNIQUET:  33 min at 225 mmHg  DICTATION: .Other Dictation: Dictation Number [id number]  PLAN OF CARE: Admit to inpatient   PATIENT DISPOSITION:  PACU - hemodynamically stable.   Delay start of Pharmacological VTE agent (>24hrs) due to surgical blood loss or risk of bleeding: no

## 2021-08-29 NOTE — Progress Notes (Signed)
Pt came back from MRI, left knee site was darning thick pinkish pus fluid constantly. Hydrocolloid dressing taken off, left knee is more swollen , red than last picture on the chart.  2831: on call MD DR. Zinovie notified. Also notified MD that pt is having low grade fever 99-100.7, jittery, restless , pulling on lines, confused but easily reoriented. Urine is slightly pink tinged, not sure if pt pulled on foley cathter. Asked MD if she would come to access the wound, Per MD clean the wound, and wrap it with 4x4 and let day shift monitor.stated wound care will be consulted. Will continue to monitor.

## 2021-08-29 NOTE — Progress Notes (Signed)
Dressing changed to left knee, cleaned with normal saline, 4x4, Abdominal pad applied, wrapped with kurlex. Pt's urine now is little darker than pinked tinged. On call MD Dr. Shanon Payor notified. Will continue to monitor.

## 2021-08-29 NOTE — Anesthesia Preprocedure Evaluation (Addendum)
Anesthesia Evaluation  Patient identified by MRN, date of birth, ID band Patient awake    Reviewed: Allergy & Precautions, NPO status , Patient's Chart, lab work & pertinent test results, reviewed documented beta blocker date and time   Airway Mallampati: II  TM Distance: >3 FB Neck ROM: Full    Dental  (+) Dental Advisory Given, Edentulous Lower, Edentulous Upper   Pulmonary asthma , COPD,  COPD inhaler, former smoker,    Pulmonary exam normal breath sounds clear to auscultation       Cardiovascular hypertension, Pt. on medications and Pt. on home beta blockers + Peripheral Vascular Disease  Normal cardiovascular exam Rhythm:Regular Rate:Normal     Neuro/Psych PSYCHIATRIC DISORDERS Anxiety  Neuromuscular disease    GI/Hepatic Neg liver ROS, GERD  Medicated,  Endo/Other  diabetes, Type 2, Insulin Dependent, Oral Hypoglycemic AgentsObesity   Renal/GU Renal disease (AKI)     Musculoskeletal  (+) Arthritis , Left Knee Infection   Abdominal   Peds  Hematology  (+) Blood dyscrasia, anemia ,   Anesthesia Other Findings Day of surgery medications reviewed with the patient.  Reproductive/Obstetrics                             Anesthesia Physical Anesthesia Plan  ASA: 3 and emergent  Anesthesia Plan: General   Post-op Pain Management:    Induction: Intravenous, Rapid sequence and Cricoid pressure planned  PONV Risk Score and Plan: 2 and Dexamethasone and Ondansetron  Airway Management Planned: Oral ETT  Additional Equipment:   Intra-op Plan:   Post-operative Plan: Extubation in OR  Informed Consent: I have reviewed the patients History and Physical, chart, labs and discussed the procedure including the risks, benefits and alternatives for the proposed anesthesia with the patient or authorized representative who has indicated his/her understanding and acceptance.     Dental advisory  given  Plan Discussed with: CRNA  Anesthesia Plan Comments:        Anesthesia Quick Evaluation

## 2021-08-30 ENCOUNTER — Encounter (HOSPITAL_COMMUNITY): Payer: Self-pay | Admitting: Orthopedic Surgery

## 2021-08-30 LAB — CBC WITH DIFFERENTIAL/PLATELET
Abs Immature Granulocytes: 0.09 10*3/uL — ABNORMAL HIGH (ref 0.00–0.07)
Basophils Absolute: 0.1 10*3/uL (ref 0.0–0.1)
Basophils Relative: 0 %
Eosinophils Absolute: 0.1 10*3/uL (ref 0.0–0.5)
Eosinophils Relative: 1 %
HCT: 25.7 % — ABNORMAL LOW (ref 39.0–52.0)
Hemoglobin: 8.3 g/dL — ABNORMAL LOW (ref 13.0–17.0)
Immature Granulocytes: 1 %
Lymphocytes Relative: 10 %
Lymphs Abs: 1.5 10*3/uL (ref 0.7–4.0)
MCH: 29.3 pg (ref 26.0–34.0)
MCHC: 32.3 g/dL (ref 30.0–36.0)
MCV: 90.8 fL (ref 80.0–100.0)
Monocytes Absolute: 1.6 10*3/uL — ABNORMAL HIGH (ref 0.1–1.0)
Monocytes Relative: 11 %
Neutro Abs: 11.7 10*3/uL — ABNORMAL HIGH (ref 1.7–7.7)
Neutrophils Relative %: 77 %
Platelets: 275 10*3/uL (ref 150–400)
RBC: 2.83 MIL/uL — ABNORMAL LOW (ref 4.22–5.81)
RDW: 17.1 % — ABNORMAL HIGH (ref 11.5–15.5)
WBC: 15.1 10*3/uL — ABNORMAL HIGH (ref 4.0–10.5)
nRBC: 0 % (ref 0.0–0.2)

## 2021-08-30 LAB — RENAL FUNCTION PANEL
Albumin: 1.6 g/dL — ABNORMAL LOW (ref 3.5–5.0)
Anion gap: 7 (ref 5–15)
BUN: 24 mg/dL — ABNORMAL HIGH (ref 8–23)
CO2: 25 mmol/L (ref 22–32)
Calcium: 7.5 mg/dL — ABNORMAL LOW (ref 8.9–10.3)
Chloride: 100 mmol/L (ref 98–111)
Creatinine, Ser: 1.58 mg/dL — ABNORMAL HIGH (ref 0.61–1.24)
GFR, Estimated: 45 mL/min — ABNORMAL LOW (ref >60)
Glucose, Bld: 191 mg/dL — ABNORMAL HIGH (ref 70–99)
Phosphorus: 3.2 mg/dL (ref 2.5–4.6)
Potassium: 3.8 mmol/L (ref 3.5–5.1)
Sodium: 132 mmol/L — ABNORMAL LOW (ref 135–145)

## 2021-08-30 LAB — GLUCOSE, CAPILLARY
Glucose-Capillary: 150 mg/dL — ABNORMAL HIGH (ref 70–99)
Glucose-Capillary: 132 mg/dL — ABNORMAL HIGH (ref 70–99)
Glucose-Capillary: 122 mg/dL — ABNORMAL HIGH (ref 70–99)
Glucose-Capillary: 171 mg/dL — ABNORMAL HIGH (ref 70–99)

## 2021-08-30 MED ORDER — COLLAGENASE 250 UNIT/GM EX OINT
TOPICAL_OINTMENT | Freq: Every day | CUTANEOUS | Status: DC
  Filled 2021-08-30 (×3): qty 30

## 2021-08-30 MED ORDER — LACTATED RINGERS IV SOLN: INTRAVENOUS | Status: DC

## 2021-08-30 NOTE — Progress Notes (Signed)
Occupational Therapy Treatment Patient Details Name: Leslie [name] MRN: [id number] DOB: 1945/11/18 Today's Date: 08/30/2021   History of present illness Pt is a 75 y.o. male admitted 08/18/21 with AMS and L knee pain; workup for L knee sepsis. S/p L knee aspiration, then L knee I&D on 10/24. Course complicated by encephalopathy. MRI L femur 11/2 shows no osteomyelitis; L knee effusion with Baker's cyst; pt declined L knee MRI. PMH includes PAD s/p bilateral BKA (left 2017, right 2019), cellulitis, COPD, gout, arthritis, DM2.   OT comments  Pt. Seen for skilled OT therapy session.  Session limited secondary to pt. With notable lethargic presentation.  Difficult to fully arouse.  Total assist for bed level grooming task. Refusing breakfast with multiple attempts and repositioning to aide in arousal attempts.     Recommendations for follow up therapy are one component of a multi-disciplinary discharge planning process, led by the attending physician.  Recommendations may be updated based on patient status, additional functional criteria and insurance authorization.    Follow Up Recommendations       Assistance Recommended at Discharge    Equipment Recommendations       Recommendations for Other Services      Precautions / Restrictions Precautions Precautions: Fall;Other (comment) Precaution Comments: chronic bilateral BKA (has RLE prosthetic in room, but no socks/liners); significant h/o falls Restrictions Other Position/Activity Restrictions: S/p L knee aspiration, then L knee I&D on 10/24       Mobility Bed Mobility                    Transfers                         Balance                                           ADL either performed or assessed with clinical judgement   ADL Overall ADL's : Needs assistance/impaired     Grooming: Total assistance;Bed level Grooming Details (indicate cue type and reason): unable to fully arouse for  participation                               General ADL Comments: multiple attempts for arousal, pt. unable to sustain full attention to task. total a for bed level grooming task. cna present and reports pt. was up and restless most of the night     Vision       Perception     Praxis      Cognition Arousal/Alertness: Lethargic Behavior During Therapy: Flat affect Overall Cognitive Status: No family/caregiver present to determine baseline cognitive functioning                                            Exercises     Shoulder Instructions       General Comments      Pertinent Vitals/ Pain       Pain Assessment: No/denies pain  Home Living  Prior Functioning/Environment              Frequency           Progress Toward Goals  OT Goals(current goals can now be found in the care plan section)  Progress towards OT goals: Not progressing toward goals - comment (pt. unable to fully arouse for particiaption today)     Plan      Co-evaluation                 AM-PAC OT "6 Clicks" Daily Activity     Outcome Measure                    End of Session        Activity Tolerance     Patient Left     Nurse Communication          Time: 8937-3428 OT Time Calculation (min): 10 min  Charges: OT General Charges $OT Visit: 1 Visit OT Treatments $Self Care/Home Management : 8-22 mins  Sonia Baller, COTA/L Acute Rehabilitation [phone]   Tanya Nones 08/30/2021, 10:26 AM

## 2021-08-30 NOTE — Op Note (Signed)
NAME: Leslie [name], Leslie [name] MEDICAL RECORD NO: [id number] ACCOUNT NO: [id number] DATE OF BIRTH: Feb 16, 1946 FACILITY: [facility] LOCATION: [facility] PHYSICIAN: Pietro Cassis. Alvan Dame, MD  Operative Report   DATE OF PROCEDURE: 08/29/2021  DATE OF PROCEDURE:  08/29/2021.  PREOPERATIVE DIAGNOSIS:  Persistent left septic knee with extension of infection into the inferior medial aspect of his knee and proximal leg towards his stump.  POSTOPERATIVE DIAGNOSIS:  Persistent left septic knee with extension of infection into the inferior medial aspect of his knee and proximal leg towards his stump.  PROCEDURE:  Repeat open arthrotomy excisional and non-excisional debridement of left knee.  Excisional debridement included the use of a scalpel and the Bovie cautery of nonviable tissue including subcutaneous fat, synovium, tendon.  Non-excisional  debridement included 6 liters of normal saline solution with pulse lavage.  SURGEON:  Dr. Alvan Dame.  ASSISTANT:  Surgical team.  ANESTHESIA:  General.  ESTIMATED BLOOD LOSS:  Less than 50 mL.  TOURNIQUET: Tourniquet was up for 33 minutes at 225 mmHg.  DRAINS:  One medium Hemovac drain was placed into the suprapatellar pouch within the knee itself.  This was labeled as A.  A second Hemovac drain was placed into the inferior medial aspect of the knee into the proximal leg towards the stump labeled as B.  INDICATIONS:  The patient is a 75 year old male with history of bilateral below-the-knee amputation as well as multiple comorbidities.  He presented to the hospital earlier in the week and a septic picture with findings of a septic left knee.  He was  taken to the operating room urgently for an open arthrotomy and I and D of his knee.  Unfortunately, his infection has yet to be cleared despite antibiotic use.  He was seen and evaluated and noted to have persistent swelling in the knee with surrounding  erythema and persistent purulent drainage from the inferior aspect of the wound.   I felt that it was necessary at this point to repeat the open arthrotomy with excisional and non-excisional debridement.  The risks and benefits and necessity of the  procedure were reviewed with the patient and consent was obtained.  DESCRIPTION OF PROCEDURE:  The patient was brought to the operative theater.  Once adequate anesthesia, preoperative antibiotics were not given as he is on scheduled Ancef.  Of note, we did give him his next scheduled dose at 09:00 p.m.  A thigh  tourniquet was placed on the left lower extremity.  The left lower extremity was then prepped and draped in sterile fashion.  A timeout was performed identifying the patient, planned procedure, and extremity.  The tourniquet was elevated to 225 mmHg  after the leg had been elevated during prep and draping.  The previous incision was utilized.  Upon entering into the subdermal layer, the prepatellar region was noted to have a lot of purulence in this area.  I did use a 10 mL syringe and removed this  purulent fluid.  Once this area was opened and exposed, I opened the knee up and exposed further purulence.  An aspiration of this fluid was taken and sent to pathology for Gram stain and culture.  I removed all remaining sutures.  At this point, using a  scalpel and the Bovie cautery, I performed an excisional debridement of nonviable tissue including synovium, fat and tendon.  Once I had adequately debrided the joint including the suprapatellar pouch, medial gutter, I began irrigating the knee with  normal saline solution.  Of note, I did note  that I did not appreciate this at his first procedure.  There was significant extension of the purulent material in the inferior medial aspect of the knee into the proximal leg towards the stump where he had a  wound.  This potentially could have represented a source for his infection to the joint.  I used 6 liters of normal saline solution with pulse lavage both for the knee as well as this large  space created by infection, again on the inferior medial aspect  of the knee into the proximal leg.  Once the 6 liters of normal saline solution were completed, I reapproximated the extensor mechanism using a V-Loc suture.  The remainder of the wound was closed with 2-0 Vicryl in running and staples.  I did place two  medium Hemovac drains into his knee, one was placed into the knee itself in the suprapatellar pouch and exiting the lateral aspect of the proximal knee/distal thigh.  I labeled this as A. A second drain was placed into this large potential space in the  proximal leg medially.  This exited the knee area medially and was labeled B. All wounds were dressed with Xeroform and a bulky dressing.  The drains were placed on suction.  He was then awoken from anesthesia and brought to the recovery room in stable  condition.  Postoperatively, he will remain on Ancef.  These new cultures can be followed for any potential change in a bacterial species, representing a concern for persistent infection.   MUK D: 08/29/2021 9:48:51 pm T: 08/30/2021 2:34:00 am  JOB: [id number]/ [id number]

## 2021-08-30 NOTE — Progress Notes (Signed)
PT Cancellation Note  Patient Details Name: Leslie [name] MRN: [id number] DOB: 10-23-1946   Cancelled Treatment:    Reason Eval/Treat Not Completed: Patient declined, no reason specified   [place] 08/30/2021, 5:18 PM [place] Pager [phone] Office [phone]

## 2021-08-30 NOTE — Progress Notes (Addendum)
   Subjective: I evaluated Mr. Frenette at bedside.  He was sleeping soundly in bed.  He is on nasal cannula 3 L. Patient underwent I&D of left knee overnight.  Objective:  Vital signs in last 24 hours: Vitals:   08/29/21 2239 08/29/21 2358 08/30/21 0419 08/30/21 0749  BP: 109/65 121/64 (!) 146/83 (!) 143/101  Pulse: (!) 102 98 99 (!) 101  Resp: 19 18 19 20   Temp: 98 F (36.7 C) 97.9 F (36.6 C) 97.8 F (36.6 C) 98 F (36.7 C)  TempSrc: Oral Oral Oral Oral  SpO2: 100% 99% 100% 100%  Weight:   88 kg   Height:       Physical Exam Constitutional:      General: He is sleeping.  HENT:     Head: Normocephalic and atraumatic.  Cardiovascular:     Rate and Rhythm: Normal rate.     Heart sounds: Normal heart sounds.  Pulmonary:     Breath sounds: Normal breath sounds.  Abdominal:     General: Abdomen is protuberant.  Genitourinary:    Comments: Foley in place; sediments present Musculoskeletal:     Comments: Left residual limb wrapped in bandage; no drainage through the bandage. Hemovac not present     Right Lower Extremity: Right leg is amputated below knee.     Left Lower Extremity: Left leg is amputated below knee.     Assessment/Plan:  Principal Problem:   Septic arthritis of knee (HCC) Active Problems:   S/P BKA (below knee amputation) bilateral (HCC)   Acute kidney injury ([place])   Altered mental status   S/P debridement   MSSA (methicillin susceptible Staphylococcus aureus) infection   Cellulitis of left lower extremity   Pressure injury of skin  Septic arthritis of the left knee Repeat I&D overnight; Patient remains afebrile --Surgical wound cultures pending -- IV cefazolin 2 g Q8H--6 weeks per ID recc -- IV Dilaudid for pain  Hypovolemic hyponatremia Sodium levels continue to improve; patient is asymptomatic --IV LR continuous infusion  Acute renal failure Patient recently underwent I&D procedure overnight.  Slight increase in creatinine (1.58 increased  from 1.41) likely in the setting of infection. -- IV LR continuous infusion  Upper GI bleed 2/2 duodenal ulcer Over the course of hospital admission patient has so far received 2 units of blood.  Patient is still experiencing melena most recently overnight according to the nurse.  His current hemoglobin is 8.3 which is decreased from yesterday.  -- Transfuse if hemoglobin <8 -- Consider consulting IR  Delirium 2/2 infection CBC reveals white count trending down (15.1 down from 19.2).  Patient remains afebrile.  Patient was sleeping comfortably this morning.  Nurses state that this afternoon patient is alert and tolerating diet well.  But mentation not at baseline. -- Tylenol as needed --Continuous IV LR fluids --Monitor for improvements in mentation   Prior to Admission Living Arrangement: Anticipated Discharge Location: Barriers to Discharge: Dispo: Anticipated discharge in approximately 1-2 day(s).   Timothy Lasso, MD 08/30/2021, 8:00 AM Pager: [phone] After 5pm on weekdays and 1pm on weekends: On Call pager [phone]

## 2021-08-30 NOTE — Progress Notes (Signed)
Patient confused but has was previously redirectable throughout the evening.  This morning pt pulled hemovac drains placed during I&D of left knee 08/29/21. Also pulling at foley, but did not pull out.   Contacted IMTS, Dr. Bunnie Pion advised she would let the day team know, no orders given.

## 2021-08-30 NOTE — Consult Note (Signed)
[facility] Nurse Consult Note: Patient receiving care in [place]. NT at bedside to assist with positioning for wound area evaluation. Reason for Consult: yellow slough on the sacrum. Wound type: The patient's primary nurse explains that the patient is mobile in the bed, and has had many episodes of incontinence.  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 Pressure Injury POA: Yes/No/NA Measurement: 6 cm x 5 cm. Encompasses the apex of the intergluteal fold, some of the intergluteal fold, and areas along the left buttock adjacent to the fold. Wound bed: 50% pink, 50% yellow non-viable slough Drainage (amount, consistency, odor) none Periwound: intact, but moist Dressing procedure/placement/frequency: Apply Santyl to wounds on coccyx, buttocks in a nickel thick layer. Cover with a saline moistened gauze, then foam dressing.  Change daily.  Monitor the wound area(s) for worsening of condition such as: Signs/symptoms of infection,  Increase in size,  Development of or worsening of odor, Development of pain, or increased pain at the affected locations.  Notify the medical team if any of these develop.  Thank you for the consult.  Discussed plan of care with the bedside nurse.  [facility] nurse will not follow at this time.  Please re-consult the [facility] team if needed.  Val Riles, RN, MSN, CWOCN, CNS-BC, pager [phone]

## 2021-08-30 NOTE — Progress Notes (Signed)
Patient ID: Leslie [name], male   DOB: [birth year], 75 y.o.   MRN: [id number]   LOS: 12 days   Subjective: Doing ok, says leg feels about the same. Pt pulled drains out this morning.   Objective: Vital signs in last 24 hours: Temp:  [97.8 F (36.6 C)-99.9 F (37.7 C)] 98.8 F (37.1 C) (11/04 1301) Pulse Rate:  [98-108] 102 (11/04 1301) Resp:  [13-20] 18 (11/04 1301) BP: (109-151)/(56-101) 151/65 (11/04 1301) SpO2:  [97 %-100 %] 97 % (11/04 1301) Weight:  [88 kg] 88 kg (11/04 0419) Last BM Date: 08/30/21   Laboratory  CBC Recent Labs    08/29/21 0515 08/30/21 0254  WBC 19.2* 15.1*  HGB 9.3* 8.3*  HCT 27.7* 25.7*  PLT 253 275   BMET Recent Labs    08/29/21 0515 08/30/21 0254  NA 131* 132*  K 3.9 3.8  CL 101 100  CO2 22 25  GLUCOSE 201* 191*  BUN 25* 24*  CREATININE 1.41* 1.58*  CALCIUM 7.7* 7.5*     Physical Exam General appearance: alert and no distress LLE: Incision C/D/I, appearance improved from yesterday   Assessment/Plan: LLE infection -- Continue dry dressings, change prn.    Lisette Abu, PA-C Orthopedic Surgery [phone] 08/30/2021

## 2021-08-31 LAB — CBC WITH DIFFERENTIAL/PLATELET
Abs Immature Granulocytes: 0.06 10*3/uL (ref 0.00–0.07)
Basophils Absolute: 0.1 10*3/uL (ref 0.0–0.1)
Basophils Relative: 1 %
Eosinophils Absolute: 0.1 10*3/uL (ref 0.0–0.5)
Eosinophils Relative: 1 %
HCT: 24.8 % — ABNORMAL LOW (ref 39.0–52.0)
Hemoglobin: 7.7 g/dL — ABNORMAL LOW (ref 13.0–17.0)
Immature Granulocytes: 1 %
Lymphocytes Relative: 15 %
Lymphs Abs: 1.3 10*3/uL (ref 0.7–4.0)
MCH: 28.4 pg (ref 26.0–34.0)
MCHC: 31 g/dL (ref 30.0–36.0)
MCV: 91.5 fL (ref 80.0–100.0)
Monocytes Absolute: 0.9 10*3/uL (ref 0.1–1.0)
Monocytes Relative: 10 %
Neutro Abs: 6.7 10*3/uL (ref 1.7–7.7)
Neutrophils Relative %: 72 %
Platelets: 276 10*3/uL (ref 150–400)
RBC: 2.71 MIL/uL — ABNORMAL LOW (ref 4.22–5.81)
RDW: 17 % — ABNORMAL HIGH (ref 11.5–15.5)
WBC: 9.2 10*3/uL (ref 4.0–10.5)
nRBC: 0 % (ref 0.0–0.2)

## 2021-08-31 LAB — RENAL FUNCTION PANEL
Albumin: <1.5 g/dL — ABNORMAL LOW (ref 3.5–5.0)
Anion gap: 5 (ref 5–15)
BUN: 18 mg/dL (ref 8–23)
CO2: 24 mmol/L (ref 22–32)
Calcium: 7.1 mg/dL — ABNORMAL LOW (ref 8.9–10.3)
Chloride: 106 mmol/L (ref 98–111)
Creatinine, Ser: 1.23 mg/dL (ref 0.61–1.24)
GFR, Estimated: >60 mL/min (ref >60)
Glucose, Bld: 131 mg/dL — ABNORMAL HIGH (ref 70–99)
Phosphorus: 2.7 mg/dL (ref 2.5–4.6)
Potassium: 3.6 mmol/L (ref 3.5–5.1)
Sodium: 135 mmol/L (ref 135–145)

## 2021-08-31 LAB — GLUCOSE, CAPILLARY
Glucose-Capillary: 153 mg/dL — ABNORMAL HIGH (ref 70–99)
Glucose-Capillary: 231 mg/dL — ABNORMAL HIGH (ref 70–99)
Glucose-Capillary: 118 mg/dL — ABNORMAL HIGH (ref 70–99)
Glucose-Capillary: 149 mg/dL — ABNORMAL HIGH (ref 70–99)

## 2021-08-31 LAB — HEMOGLOBIN AND HEMATOCRIT, BLOOD
HCT: 27.4 % — ABNORMAL LOW (ref 39.0–52.0)
Hemoglobin: 8.6 g/dL — ABNORMAL LOW (ref 13.0–17.0)

## 2021-08-31 LAB — PREPARE RBC (CROSSMATCH)

## 2021-08-31 LAB — ZINC: Zinc: 54 ug/dL (ref 44–115)

## 2021-08-31 MED ORDER — SODIUM CHLORIDE 0.9% IV SOLUTION: Freq: Once | INTRAVENOUS | Status: AC

## 2021-08-31 NOTE — Progress Notes (Signed)
Subjective: 2 Days Post-Op Procedure(s) (LRB): IRRIGATION AND DEBRIDEMENT Left Knee (Left) Patient reports pain as  denies knee pain .  Pt tolerating regular diet.  Drains removed yesterday.  X  Objective: Vital signs in last 24 hours: Temp:  [97.9 F (36.6 C)-100.3 F (37.9 C)] 98.4 F (36.9 C) (11/05 0343) Pulse Rate:  [98-108] 98 (11/05 0343) Resp:  [18-20] 18 (11/05 0343) BP: (119-161)/(60-101) 130/60 (11/05 0343) SpO2:  [93 %-100 %] 97 % (11/05 0343)  Intake/Output from previous day: 11/04 0701 - 11/05 0700 In: 130 [P.O.:120; I.V.:10] Out: 1725 [Urine:1725] Intake/Output this shift: No intake/output data recorded.  Recent Labs    08/28/21 2251 08/29/21 0515 08/30/21 0254 08/31/21 0315  HGB 9.1* 9.3* 8.3* 7.7*   Recent Labs    08/30/21 0254 08/31/21 0315  WBC 15.1* 9.2  RBC 2.83* 2.71*  HCT 25.7* 24.8*  PLT 275 276   Recent Labs    08/30/21 0254 08/31/21 0315  NA 132* 135  K 3.8 3.6  CL 100 106  CO2 25 24  BUN 24* 18  CREATININE 1.58* 1.23  GLUCOSE 191* 131*  CALCIUM 7.5* 7.1*   No results for input(s): LABPT, INR in the last 72 hours.  PE:  Good ROM of the left knee.  Dressed and dry.  5/5 strength in flexion and extension.   Assessment/Plan: 2 Days Post-Op Procedure(s) (LRB): IRRIGATION AND DEBRIDEMENT Left Knee (Left) Continue abx per ID.      Wylene Simmer 08/31/2021, 7:31 AM

## 2021-08-31 NOTE — Progress Notes (Signed)
Subjective: I seen and evaluated [name] at bedside.  He was lying comfortably in bed.  He was alert and oriented x3.  He appears to be back at baseline.  He was able to answer my questions appropriately.  He states minimal tenderness in left residual limb, otherwise no other complaints.  He states that he slept well last night and tolerating fluids/food well.  Objective:  Vital signs in last 24 hours: Vitals:   08/30/21 2034 08/30/21 2334 08/31/21 0343 08/31/21 0745  BP: (!) 161/100 139/68 130/60 140/68  Pulse: (!) 108 (!) 101 98 95  Resp: 20 19 18 16   Temp: 97.9 F (36.6 C) 98.5 F (36.9 C) 98.4 F (36.9 C) 98.5 F (36.9 C)  TempSrc: Oral Oral Oral Oral  SpO2: 93% 96% 97% 100%  Weight:      Height:       Review of Systems  Constitutional:  Negative for chills and fever.  Eyes:  Negative for blurred vision.  Respiratory:  Negative for shortness of breath.   Cardiovascular:  Negative for chest pain and palpitations.  Gastrointestinal:  Negative for abdominal pain, constipation, diarrhea, heartburn, nausea and vomiting.  Neurological:  Negative for dizziness and headaches.   Physical Exam Constitutional:      General: He is not in acute distress. HENT:     Head: Normocephalic and atraumatic.  Cardiovascular:     Rate and Rhythm: Normal rate.     Heart sounds: Normal heart sounds.  Pulmonary:     Effort: Pulmonary effort is normal.     Breath sounds: Normal breath sounds.  Abdominal:     General: Abdomen is flat.  Genitourinary:    Comments: Foley present Musculoskeletal:     Comments: Left residual limb neatly wrapped in bandage. NO drainage noted     Right Lower Extremity: Right leg is amputated below knee.     Left Lower Extremity: Left leg is amputated below knee.  Skin:    General: Skin is warm and moist.  Neurological:     General: No focal deficit present.     Mental Status: He is alert and oriented to person, place, and time. Mental status is at  baseline.  Psychiatric:        Behavior: Behavior normal. Behavior is cooperative.        Cognition and Memory: Cognition normal.     Assessment/Plan:  Principal Problem:   Septic arthritis of knee (HCC) Active Problems:   S/P BKA (below knee amputation) bilateral (HCC)   Acute kidney injury ([place])   Altered mental status   S/P debridement   MSSA (methicillin susceptible Staphylococcus aureus) infection   Cellulitis of left lower extremity   Pressure injury of skin  Septic arthritis of the left knee POD 1 repeat I&D; surgical wound culture show no growth in the last 12 hours.  Patient remains afebrile.  Patient reports minimal pain in the left residual limb. --IV cefazolin 2 g every 8 hours-6 weeks per ID recc --IV Dilaudid for pain  Upper GI bleed secondary to duodenal ulcer Patient is still experiencing melena most recently overnight.  His current hemoglobin is 7.7 down from 8.3 yesterday. GI deferred to IR for surgical intervention.  --Transfuse 1 unit of blood products --IR on board --NPO at midnight for procedure tomorrow  Hypovolemic hyponatremia, resolved Sodium levels within normal limits; patient is asymptomatic --discontinue IV LR continuous infusion  Delirium secondary to infection, resolved White count within normal limits.  Patient remains afebrile.  Patient was alert and oriented x3 this morning.  Patient is back at baseline   Prior to Admission Living Arrangement: Anticipated Discharge Location: Barriers to Discharge: Dispo: Anticipated discharge in approximately 1-2 day(s).   Timothy Lasso, MD 08/31/2021, 10:35 AM Pager: [phone] After 5pm on weekdays and 1pm on weekends: On Call pager [phone]

## 2021-08-31 NOTE — Consult Note (Signed)
Chief Complaint: Patient was seen in consultation today for  Chief Complaint  Patient presents with   Altered Mental Status    Referring Physician(s): Dr. Jeanice Lim  Supervising Physician: Aletta Edouard  Patient Status: [facility] - In-pt  History of Present Illness: Leslie [name] [name] is a 75 y.o. male with a medical history significant for cellulitis, COPD, HTN, PAD (bilateral AKAs), substance/alcohol abuse, DM2. He was brought to the ED via EMS 08/18/21 for altered level of consciousness. Work up was pertinent for significant leukocytosis, acute renal failure and possible developing cellulitis to the left leg. Further work up revealed a septic left knee and he was taken to the OR 08/19/21 for an I&D with ortho. He was taken back to the OR 08/30/21 due to persistent left septic knee and he had a repeat debridement.   His hospital course has been complicated by the development of hematemesis and melena and he underwent EGD with GI on 08/25/21. The EGD revealed severe esophagitis and a large duodenal ulcer with adherent clot and active oozing. Per procedure notes, "definitive hemostasis was not attempted out of concern of creating catastrophic bleeding." Recommendations were made for protonix and sucralfate. Possible embolization was discussed with IR but given the patient's renal insufficiency the decision was made to attempt medical management as long as the patient remained hemodynamically stable.   Leslie [name] [name] continued to experience melena with drops in hemoglobin levels and he received PRBCs 08/31/21. Interventional Radiology has been asked to evaluate this patient for an image-guided mesenteric arteriogram with possible intervention/embolization for bleeding duodenal ulcer.   Past Medical History:  Diagnosis Date   Anxiety    Panic attack - 10  years aago- ? panic    Asthma    Cellulitis of left foot 2016   Healed and recurred 01/2016   Cellulitis of right foot 06/30/2012   Chronic lower  back pain 07/01/2012   COPD (chronic obstructive pulmonary disease) ([place])    Uses nebulizers at home. Former smoker, current chewing tobacco. 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   GERD (gastroesophageal reflux disease)    Gouty arthritis 07/01/2012   Not attacks in ~5 yrs (03/2016)   Hepatitis 1967   "in jail when I got it; dr said it was pretty bad; quaranteened X 30d"   Hyperlipidemia    Hypertension    Lung nodule, solitary 07/2015   Due for 1-yr recheck in 07/2017   Peripheral arterial disease ([place])    critical limb ischemia   Peripheral neuropathy 07/01/2012   Seasonal allergies    Skin growth 07/01/2012   right side of nares; "been on there 20 years"   Substance abuse ([address])    Type II diabetes mellitus ([place]) 2002   Diagnosed in 2002    Past Surgical History:  Procedure Laterality Date   AMPUTATION Left 03/28/2016   Procedure: AMPUTATION left foot 4th and 5 th ray;  Surgeon: Newt Minion, MD;  Location: [place];  Service: Orthopedics;  Laterality: Left;   AMPUTATION Left 04/11/2016   Procedure: LEFT BELOW KNEE AMPUTATION;  Surgeon: Newt Minion, MD;  Location: [place];  Service: Orthopedics;  Laterality: Left;   AMPUTATION Right 01/22/2018   Procedure: RIGHT FOOT 3RD RAY AMPUTATION;  Surgeon: Newt Minion, MD;  Location: [place];  Service: Orthopedics;  Laterality: Right;   AMPUTATION Right 02/19/2018   Procedure: RIGHT TRANSMETATARSAL AMPUTATION;  Surgeon: Newt Minion, MD;  Location: [place];  Service: Orthopedics;  Laterality: Right;   AMPUTATION Right 03/28/2018  Procedure: RIGHT BELOW KNEE AMPUTATION;  Surgeon: Newt Minion, MD;  Location: [place];  Service: Orthopedics;  Laterality: Right;   BIOPSY  08/25/2021   Procedure: BIOPSY;  Surgeon: Daryel November, MD;  Location: [facility] ENDOSCOPY;  Service: Gastroenterology;;   ESOPHAGOGASTRODUODENOSCOPY (EGD) WITH PROPOFOL N/A 08/25/2021   Procedure: ESOPHAGOGASTRODUODENOSCOPY (EGD) WITH PROPOFOL;  Surgeon: Daryel November, MD;  Location: [place];   Service: Gastroenterology;  Laterality: N/A;   HOT HEMOSTASIS  08/25/2021   Procedure: HOT HEMOSTASIS (ARGON PLASMA COAGULATION/BICAP);  Surgeon: Daryel November, MD;  Location: [facility] ENDOSCOPY;  Service: Gastroenterology;;   I & D EXTREMITY Left 08/19/2021   Procedure: IRRIGATION AND DEBRIDEMENT LEFT KNEE;  Surgeon: Paralee Cancel, MD;  Location: [place];  Service: Orthopedics;  Laterality: Left;   I & D EXTREMITY Left 08/29/2021   Procedure: IRRIGATION AND DEBRIDEMENT Left Knee;  Surgeon: Paralee Cancel, MD;  Location: [place];  Service: Orthopedics;  Laterality: Left;   LEG AMPUTATION BELOW KNEE Left 04/11/2016   LOWER EXTREMITY ANGIOGRAM N/A 04/18/2013   Procedure: LOWER EXTREMITY ANGIOGRAM;  Surgeon: Sherren Mocha, MD;  Location: [facility] CATH LAB;  Service: Cardiovascular;  Laterality: N/A;   LOWER EXTREMITY ANGIOGRAM  08/13/2015   bilateral iliac    PERIPHERAL VASCULAR CATHETERIZATION Bilateral 08/13/2015   Procedure: Lower Extremity Angiography;  Surgeon: Lorretta Harp, MD;  Location: [place] CV LAB;  Service: Cardiovascular;  Laterality: Bilateral;   PERIPHERAL VASCULAR CATHETERIZATION N/A 08/13/2015   Procedure: Abdominal Aortogram;  Surgeon: Lorretta Harp, MD;  Location: [place] CV LAB;  Service: Cardiovascular;  Laterality: N/A;   SCLEROTHERAPY  08/25/2021   Procedure: SCLEROTHERAPY;  Surgeon: Daryel November, MD;  Location: [facility] ENDOSCOPY;  Service: Gastroenterology;;   TOE AMPUTATION Right 01/22/2018   3RD TOE RIGHT FOOT    Allergies: Atorvastatin, Statins, and Flexeril [cyclobenzaprine]  Medications: Prior to Admission medications   Medication Sig Start Date End Date Taking? Authorizing Provider  acetaminophen (TYLENOL) 325 MG tablet Take 2 tablets (650 mg total) by mouth every 6 (six) hours as needed for mild pain (or Fever >/= 101). Patient taking differently: Take 650 mg by mouth every 8 (eight) hours as needed for mild pain (or Fever >/= 101). 03/30/18  Yes Marcelyn Bruins, MD  albuterol (PROVENTIL HFA;VENTOLIN HFA) 108 (90 Base) MCG/ACT inhaler Inhale 2 puffs into the lungs 4 (four) times daily as needed for wheezing or shortness of breath.    Yes [provider]  albuterol (PROVENTIL) (2.5 MG/3ML) 0.083% nebulizer solution Take 2.5 mg by nebulization every 6 (six) hours as needed for wheezing or shortness of breath.   Yes [provider]  allopurinol (ZYLOPRIM) 100 MG tablet Take 100 mg by mouth at bedtime. For gout   Yes [provider]  amLODipine (NORVASC) 10 MG tablet Take 1 tablet (10 mg total) by mouth daily. 08/09/12  Yes Funches, Adriana Mccallum, MD  aspirin 81 MG chewable tablet Chew 81 mg by mouth daily.   Yes [provider]  calcium carbonate (TUMS EX) 750 MG chewable tablet Chew 1 tablet by mouth 2 (two) times daily as needed for heartburn.   Yes [provider]  carbamazepine (TEGRETOL XR) 200 MG 12 hr tablet Take 200 mg by mouth 2 (two) times daily.   Yes [provider]  Cholecalciferol (VITAMIN D-3) 1000 units CAPS Take 1,000 Units by mouth daily.   Yes [provider]  Evolocumab with Infusor (Silt) 420 MG/3.5ML SOCT Inject 420 mg  into the skin every 30 (thirty) days.   Yes [provider]  famotidine (PEPCID) 20 MG tablet Take 20 mg by mouth at bedtime.   Yes [provider]  ferrous sulfate 325 (65 FE) MG tablet Take 325 mg by mouth every other day.   Yes [provider]  insulin glargine (LANTUS) 100 UNIT/ML injection Inject 75 Units into the skin daily.   Yes [provider]  LORazepam (ATIVAN) 0.5 MG tablet Take 0.25 mg by mouth daily as needed for anxiety (x2 months). 07/30/21 10/22/21 Yes [provider]  losartan-hydrochlorothiazide (HYZAAR) 100-25 MG tablet Take 1 tablet by mouth daily.   Yes [provider]  Melatonin 10 MG TABS Take 10 mg by mouth at bedtime.   Yes [provider]  metFORMIN  (GLUCOPHAGE) 850 MG tablet Take 850 mg by mouth 2 (two) times daily with a meal.   Yes [provider]  metoprolol (TOPROL-XL) 200 MG 24 hr tablet Take 200 mg by mouth daily. Take with or immediately following a meal.   Yes [provider]  Multiple Vitamins-Minerals (CERTAVITE SENIOR/ANTIOXIDANT) TABS Take 1 tablet by mouth daily.   Yes [provider]  nitroGLYCERIN (NITROSTAT) 0.4 MG SL tablet Place 0.4 mg under the tongue every 5 (five) minutes x 3 doses as needed for chest pain.  11/12/11  Yes Funches, Josalyn, MD  polyethylene glycol (MIRALAX / GLYCOLAX) 17 g packet Take 17 g by mouth every other day.   Yes [provider]  senna-docusate (SENOKOT-S) 8.6-50 MG tablet Take 2 tablets by mouth daily.   Yes [provider]  Skin Protectants, Misc. (MINERIN) CREA Apply 1 application topically 2 (two) times daily. For dryness   Yes [provider]  traZODone (DESYREL) 50 MG tablet Take 50 mg by mouth at bedtime.   Yes [provider]  vitamin B-12 (CYANOCOBALAMIN) 1000 MCG tablet Take 1,000 mcg by mouth daily.   Yes [provider]  bisacodyl (DULCOLAX) 10 MG suppository Place 10 mg rectally once as needed (for constipation not relieved by Milk of Magnesia). Patient not taking: Reported on 08/22/2021    [provider]  carbamazepine (TEGRETOL) 200 MG tablet Take 200-300 mg by mouth See admin instructions. Take 1 tablet (200 mg) by mouth every morning and 1 1/2 tablet (300 mg) every evening Patient not taking: No sig reported    [provider]  clonazePAM (KLONOPIN) 1 MG tablet Take 1 mg by mouth at bedtime.  Patient not taking: Reported on 08/22/2021    [provider]  DULoxetine (CYMBALTA) 60 MG capsule Take 60 mg by mouth daily. Patient not taking: Reported on 08/22/2021    [provider]  furosemide (LASIX) 20 MG tablet Take 20 mg by mouth daily. Patient not taking: Reported on  08/22/2021    [provider]  hydrALAZINE (APRESOLINE) 50 MG tablet Take 50 mg by mouth 3 (three) times daily. Patient not taking: Reported on 08/22/2021    [provider]  insulin glargine (LANTUS) 100 UNIT/ML injection Inject 20 Units into the skin daily.  Patient not taking: No sig reported    [provider]  losartan (COZAAR) 100 MG tablet Take 100 mg by mouth daily. Patient not taking: No sig reported    [provider]  metFORMIN (GLUCOPHAGE) 1000 MG tablet Take 1 tablet (1,000 mg total) by mouth 2 (two) times daily with a meal. Patient not taking: No sig reported 08/16/15   Eileen Stanford, PA-C  mirtazapine (REMERON) 7.5 MG tablet Take 7.5 mg by mouth at bedtime. Patient not taking: Reported on 08/22/2021    [provider]  pentoxifylline (TRENTAL) 400 MG CR tablet Take 1 tablet (400 mg total) by mouth 3 (three) times daily with meals. Patient not taking: Reported on 08/22/2021 02/04/18   Newt Minion, MD  pregabalin (LYRICA) 150 MG capsule Take 150 mg by mouth 3 (three) times daily.  Patient not taking: Reported on 08/22/2021    [provider]  ranitidine (ZANTAC) 300 MG tablet Take 300 mg by mouth at bedtime. Patient not taking: Reported on 08/22/2021    [provider]  Sodium Phosphates (FLEET ENEMA RE) Place 1 each rectally daily as needed (constipation not relieved by Bisacodyl suppository). Patient not taking: Reported on 08/22/2021    [provider]     Family History  Problem Relation Age of Onset   Diabetes Sister    Hypertension Mother     Social History   Socioeconomic History   Marital status: Widowed    Spouse name: Not on file   Number of children: Not on file   Years of education: Not on file   Highest education level: Not on file  Occupational History   Occupation: disabled  Tobacco Use   Smoking status: Former    Packs/day: 2.00    Years: 40.00    Pack years: 80.00     Types: Cigarettes    Quit date: 01/13/2002    Years since quitting: 19.6   Smokeless tobacco: Current    Types: Chew   Tobacco comments:    chews a little tobacco  Vaping Use   Vaping Use: Never used  Substance and Sexual Activity   Alcohol use: Not Currently    Comment: 03/03/18: "Used to drink alot. 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   Lives in assisted living. Performs ADLs and IADLs, including cooking and managing his medications. Friend drives him to the store. Uses rollator walker at home and motorized scooter in store.   [employer] patient   Widowed   5-8 years education   Likes to fish   Pets:  4 cockatiels and 2 lovebirds   Social Determinants of Radio broadcast assistant Strain: Not on file  Food Insecurity: Not on file  Transportation Needs: Not on file  Physical Activity: Not on file  Stress: Not on file  Social Connections: Not on file    Review of Systems: A 12 point ROS discussed and pertinent positives are indicated in the HPI above.  All other systems are negative.  Review of Systems  Constitutional:  Positive for fatigue. Negative for appetite change.  Respiratory:  Negative for cough and shortness of breath.   Cardiovascular:  Negative for chest pain.  Gastrointestinal:  Negative for abdominal pain, blood in stool, diarrhea, nausea and vomiting.  Neurological:  Negative for dizziness and headaches.   Vital Signs: BP (!) 150/109   Pulse 100   Temp 98.3 F (36.8 C) (Oral)   Resp 17   Ht 5' 6"  (1.676 m)   Wt 194 lb 0.1 oz (88 kg)   SpO2 100%   BMI 31.31 kg/m   Physical  Exam Constitutional:      General: He is not in acute distress. HENT:     Mouth/Throat:     Mouth: Mucous membranes are moist.      Pharynx: Oropharynx is clear.  Cardiovascular:     Rate and Rhythm: Normal rate and regular rhythm.  Pulmonary:     Effort: Pulmonary effort is normal.     Breath sounds: Normal breath sounds.  Abdominal:     General: Bowel sounds are normal.     Palpations: Abdomen is soft.     Tenderness: There is no abdominal tenderness.  Musculoskeletal:     Comments: Bilateral BKA. Left stump wrapped in ace bandage. Patient complains of pain in this leg.   Skin:    General: Skin is warm and dry.  Neurological:     Mental Status: He is alert and oriented to person, place, and time.    Imaging: CT ABDOMEN PELVIS WO CONTRAST  Result Date: 08/23/2021 CLINICAL DATA:  Abdominal pain. EXAM: CT ABDOMEN AND PELVIS WITHOUT CONTRAST TECHNIQUE: Multidetector CT imaging of the abdomen and pelvis was performed following the standard protocol without IV contrast. COMPARISON:  Abdominal radiograph dated 08/22/2021. FINDINGS: Evaluation of this exam is limited in the absence of intravenous contrast. Lower chest: The visualized lung bases are clear. Several pleural based calcifications noted which may be sequela of prior asbestos exposure. Three vessel coronary vascular calcification. A 12 mm nodular density at the left lung base (60/5), possibly with focus of scarring. This is however new since the study of 01/03/2019. Follow-up recommended. No intra-abdominal free air or free fluid. Hepatobiliary: Small calcified liver granuloma. The liver is otherwise unremarkable. No intrahepatic biliary dilatation. The gallbladder is unremarkable. Pancreas: Unremarkable. No pancreatic ductal dilatation or surrounding inflammatory changes. Spleen: Normal in size without focal abnormality. Adrenals/Urinary Tract: The adrenal glands are unremarkable. There is no hydronephrosis or nephrolithiasis on either side. Renal vascular calcifications. Mild fullness of the renal collecting systems and ureters may be related to reflux. The  urinary bladder is distended. Stomach/Bowel: Feeding tube with tip in the region of the proximal duodenum. There is no bowel obstruction or active inflammation. Several small scattered colonic diverticula without active inflammatory changes. The appendix is normal. Vascular/Lymphatic: Advanced aortoiliac atherosclerotic disease. The IVC is unremarkable. No portal venous gas. There is no adenopathy. Reproductive: Enlarged prostate gland measuring 6.6 cm in transverse axial diameter. The seminal vesicles are symmetric. Other: None Musculoskeletal: Degenerative changes of the spine. No acute osseous pathology. IMPRESSION: 1. No acute intra-abdominal or pelvic pathology. No bowel obstruction. Normal appendix. 2. Colonic diverticulosis. 3. Enlarged prostate gland. 4. Distended urinary bladder with probable mild reflux. 5. Aortic Atherosclerosis (ICD10-I70.0). Electronically Signed   By: Anner Crete M.D.   On: 08/23/2021 00:19   DG Abd 1 View  Result Date: 08/22/2021 CLINICAL DATA:  Feeding tube adjustment EXAM: ABDOMEN - 1 VIEW COMPARISON:  Abdominal x-ray 08/21/2021 FINDINGS: Enteric tube tip is in the proximal duodenum with injected contrast visualized in the duodenum. IMPRESSION: As above. Electronically Signed   By: Ofilia Neas M.D.   On: 08/22/2021 08:57   CT HEAD WO CONTRAST (5MM)  Result Date: 08/18/2021 CLINICAL DATA:  Delirium, altered mental status. EXAM: CT HEAD WITHOUT CONTRAST TECHNIQUE: Contiguous axial images were obtained from the base of the skull through the vertex without intravenous contrast. COMPARISON:  CT head dated 12/13/2020. FINDINGS: Brain: No evidence of acute infarction, hemorrhage, hydrocephalus, extra-axial collection or mass lesion/mass effect. There is mild cerebral volume loss  with associated ex vacuo dilatation. Periventricular white matter hypoattenuation likely represents chronic small vessel ischemic disease. Vascular: There are vascular calcifications in the  carotid siphons. Skull: Normal. Negative for fracture or focal lesion. Sinuses/Orbits: There is a small left mastoid effusion. There is mild right ethmoid sinus disease. Other: None. IMPRESSION: No acute intracranial process. Electronically Signed   By: Zerita Boers M.D.   On: 08/18/2021 18:12   US Abdomen Complete  Result Date: 08/19/2021 CLINICAL DATA:  Abdominal pain EXAM: ABDOMEN ULTRASOUND COMPLETE COMPARISON:  None. FINDINGS: Gallbladder: No gallstones or wall thickening visualized. Minimal sludge. No sonographic Murphy sign noted by sonographer. Common bile duct: Diameter: 6 mm Liver: No focal lesion identified. Within normal limits in parenchymal echogenicity. Portal vein is patent on color Doppler imaging with normal direction of blood flow towards the liver. IVC: No abnormality visualized. Pancreas: Not visualized due to overlying bowel gas. Spleen: Size and appearance within normal limits. Adjacent splenule. Right Kidney: Length: 9.4 cm. Echogenicity within normal limits. No mass or hydronephrosis visualized. Left Kidney: Length: 11.1 cm. Echogenicity within normal limits. No mass or hydronephrosis visualized. Abdominal aorta: No aneurysm visualized. Other findings: None. IMPRESSION: 1. Minimal sludge within the gallbladder. No evidence of cholecystitis. 2. Otherwise unremarkable ultrasound of the abdomen. Electronically Signed   By: Merilyn Baba M.D.   On: 08/19/2021 00:26   MR TIBIA FIBULA LEFT WO CONTRAST  Result Date: 08/29/2021 CLINICAL DATA:  Left knee pain. Prior below the knee amputations. History of gout and arthritis. Recent septic knee joint on the left, status post drainage, excisional synovial debridement coma and lavage on 08/20/2021. EXAM: MRI OF LOWER LEFT EXTREMITY WITHOUT CONTRAST TECHNIQUE: Multiplanar, multisequence MR imaging of the left tibia/fibula was performed. No intravenous contrast was administered. COMPARISON:  MRI femur from 08/28/2021 FINDINGS: The patient was  experiencing confusion during imaging and was unable to remain stationary, resulting in extensive motion artifact. We obtain the best exam we could under the circumstances. Reduced diagnostic sensitivity and specificity. Bones/Joint/Cartilage No substantial marrow edema in the left distal femur, patella, remaining tibia, or remaining fibula to indicate active osteomyelitis. There is a moderate left knee joint effusion. Ligaments N/A Muscles and Tendons Regional muscular atrophy Soft tissues Substantial medial fluid collection apparently contiguous with a Baker's cyst extending along the medial knee and stump, measuring about 11.5 by 2.8 by 17.9 cm (volume = 300 cm^3). It is possible that this is joint fluid extending into a Baker's cyst and then extravasated into the soft tissues, although this collection has a somewhat lower T2 signal than the fluid within the joint and accordingly may represent a separate process. The T1 signal characteristics are similar to the fluid in the joint and likely mildly complex. IMPRESSION: 1. No findings of active osteomyelitis. 2. Proximally 300 cc fluid collection consisting of a large Baker's cyst and apparent contiguous extension of this fluid along the medial knee and stump, extending down to the distal bony margin of the tibia. This collection is probably mildly complex. Given that it seems contiguous with the Baker's cyst, possibility of some connectivity to the knee joint effusion is raised although the collection does have slightly lower T2 signal than the fluid in the knee joint. Clearly in this circumstance in with the patient's history, infection involving this collection is not readily excluded, although I do not see gas in the soft tissues. Electronically Signed   By: Van Clines M.D.   On: 08/29/2021 07:55   MR FEMUR LEFT WO CONTRAST  Result Date: 08/28/2021 CLINICAL DATA:  Osteomyelitis and soft tissue infection, incision and drainage on 08/19/2021 EXAM: MR  OF THE LEFT FEMUR WITHOUT CONTRAST TECHNIQUE: Multiplanar, multisequence MR imaging of the left femur was performed. No intravenous contrast was administered. COMPARISON:  CT scan 08/23/2021 FINDINGS: Despite efforts by the technologist and patient, motion artifact is present on today's exam and could not be eliminated. This reduces exam sensitivity and specificity. Bones/Joint/Cartilage The patient is a below the knee amputation. The distal margin of the stump was not included on today's femur MRI. No significant abnormal marrow edema in the femur to indicate osteomyelitis. Moderate left knee joint effusion. Upper normal amount of fluid in the left hip joint. Ligaments N/A Muscles and Tendons In the distal thigh region, there is low-grade edema tracking around the margins of the distal femur as well as along fascia planes between the anterior and medial compartment and anterior and posterior compartment, with low-level adjacent muscular edema. Suspected large Baker's cyst. Soft tissues Possible fluid in the soft tissues of the proximal calf tracking down medially. This area is very poorly seen due to signal loss. Mild infiltrative edema medially along the distal thigh. Other: There a catheter in the urinary bladder. IMPRESSION: 1. No osteomyelitis of the femur is identified. 2. Left knee effusion with Baker's cyst. Signal is poor in the vicinity of the knee. 3. There is probably some fluid tracking in the medial upper calf near the Baker's cyst within the soft tissues. Signal is very poor below the level of the knee and the distal margin of the stump was not included. 4. Mild edema in the soft tissues surrounding the periosteal margin of the distal femur and tracking along adjacent fascia planes including the fascia planes between the anterior posterior compartment and anterior and medial compartment. Low-grade fasciitis is not completely excluded. Electronically Signed   By: Van Clines M.D.   On:  08/28/2021 13:41   DG Chest Portable 1 View  Result Date: 08/18/2021 CLINICAL DATA:  Altered mental status.  Hypoglycemia. EXAM: PORTABLE CHEST 1 VIEW COMPARISON:  Chest radiograph 03/27/2018.  Chest CT 01/03/2019 FINDINGS: Upper normal heart size, stable from prior exam. Unchanged mediastinal contours. Aortic atherosclerosis and tortuosity. Scattered calcified pleural plaques, better demonstrated on prior CT. No acute airspace disease. No pulmonary edema, pleural effusion, or pneumothorax. No acute osseous abnormalities are seen. IMPRESSION: No acute findings. Aortic Atherosclerosis (ICD10-I70.0). Electronically Signed   By: Keith Rake M.D.   On: 08/18/2021 19:15   DG Knee Left Port  Result Date: 08/18/2021 CLINICAL DATA:  Sepsis, possible infection. EXAM: PORTABLE LEFT KNEE - 1-2 VIEW COMPARISON:  Included portions from femur radiograph 08/12/2021 FINDINGS: Below the knee amputation. The resection margins are smooth. No erosion or periosteal reaction. The knee joint is intact. Minimal degenerative change. No erosion or bony destruction. Advanced vascular calcifications. Mild generalized soft tissue edema. No soft tissue air. Similar density adjacent to the distal femur in the lateral soft tissues, well-defined margins and of doubtful clinical significance. IMPRESSION: 1. Below the knee amputation. No radiographic findings of osteomyelitis. 2. Mild soft tissue edema. Electronically Signed   By: Keith Rake M.D.   On: 08/18/2021 19:34   DG Abd Portable 1V  Result Date: 08/21/2021 CLINICAL DATA:  Feeding tube placement EXAM: PORTABLE ABDOMEN - 1 VIEW COMPARISON:  None. FINDINGS: Heart is enlarged. Atherosclerotic calcification of the aortic arch. Visualized lungs are clear. Visualized upper abdomen is unremarkable. Distal tip of the feeding tube projecting over the gastric  fundus. IMPRESSION: Feeding tube with distal tip projecting over the gastric fundus, it could be advanced 5-8 cm.  Electronically Signed   By: Keane Police D.O.   On: 08/21/2021 17:35   ECHOCARDIOGRAM COMPLETE  Result Date: 08/20/2021    ECHOCARDIOGRAM REPORT   Patient Name:   LYELL CLUGSTON Date of Exam: 08/20/2021 Medical Rec #:  [id number]       Height:       66.0 in Accession #:    [id number]      Weight:       202.4 lb Date of Birth:  1945-11-28       BSA:          2.010 m Patient Age:    35 years        BP:           127/64 mmHg Patient Gender: M               HR:           76 bpm. Exam Location:  Inpatient Procedure: 2D Echo, Cardiac Doppler and Color Doppler Indications:    Abnl Ekg  History:        Patient has no prior history of Echocardiogram examinations.                 Risk Factors:Hypertension and Diabetes.  Sonographer:    Helmut Muster Referring Phys: [place]  1. Left ventricular ejection fraction, by estimation, is 60 to 65%. The left ventricle has normal function. The left ventricle has no regional wall motion abnormalities. There is mild concentric left ventricular hypertrophy. Left ventricular diastolic function could not be evaluated.  2. Right ventricular systolic function is normal. The right ventricular size is normal. Tricuspid regurgitation signal is inadequate for assessing PA pressure.  3. The mitral valve is degenerative. Trivial mitral valve regurgitation. Mild mitral stenosis. Moderate to severe mitral annular calcification.  4. The aortic valve is grossly normal. Aortic valve regurgitation is not visualized. No aortic stenosis is present.  5. The inferior vena cava is normal in size with greater than 50% respiratory variability, suggesting right atrial pressure of 3 mmHg. FINDINGS  Left Ventricle: Left ventricular ejection fraction, by estimation, is 60 to 65%. The left ventricle has normal function. The left ventricle has no regional wall motion abnormalities. The left ventricular internal cavity size was normal in size. There is  mild concentric left ventricular  hypertrophy. Left ventricular diastolic function could not be evaluated due to mitral annular calcification (moderate or greater). Left ventricular diastolic function could not be evaluated. Right Ventricle: The right ventricular size is normal. No increase in right ventricular wall thickness. Right ventricular systolic function is normal. Tricuspid regurgitation signal is inadequate for assessing PA pressure. Left Atrium: Left atrial size was normal in size. Right Atrium: Right atrial size was normal in size. Pericardium: Trivial pericardial effusion is present. Mitral Valve: The mitral valve is degenerative in appearance. Moderate to severe mitral annular calcification. Trivial mitral valve regurgitation. Mild mitral valve stenosis. MV peak gradient, 10.5 mmHg. The mean mitral valve gradient is 5.0 mmHg. Tricuspid Valve: The tricuspid valve is grossly normal. Tricuspid valve regurgitation is not demonstrated. No evidence of tricuspid stenosis. Aortic Valve: The aortic valve is grossly normal. Aortic valve regurgitation is not visualized. No aortic stenosis is present. Pulmonic Valve: The pulmonic valve was grossly normal. Pulmonic valve regurgitation is not visualized. No evidence of pulmonic stenosis. Aorta: The aortic root and ascending aorta  are structurally normal, with no evidence of dilitation. Venous: The right lower pulmonary vein is normal. The inferior vena cava is normal in size with greater than 50% respiratory variability, suggesting right atrial pressure of 3 mmHg. IAS/Shunts: The atrial septum is grossly normal.  LEFT VENTRICLE PLAX 2D LVIDd:         3.90 cm      Diastology LVIDs:         2.70 cm      LV e' medial:    6.09 cm/s LV PW:         1.40 cm      LV E/e' medial:  26.1 LV IVS:        1.40 cm      LV e' lateral:   10.60 cm/s LVOT diam:     2.00 cm      LV E/e' lateral: 15.0 LV SV:         89 LV SV Index:   44 LVOT Area:     3.14 cm  LV Volumes (MOD) LV vol d, MOD A2C: 105.0 ml LV vol d, MOD  A4C: 76.5 ml LV vol s, MOD A2C: 40.9 ml LV vol s, MOD A4C: 33.6 ml LV SV MOD A2C:     64.1 ml LV SV MOD A4C:     76.5 ml LV SV MOD BP:      52.8 ml RIGHT VENTRICLE             IVC RV S prime:     13.70 cm/s  IVC diam: 1.90 cm TAPSE (M-mode): 2.5 cm LEFT ATRIUM             Index        RIGHT ATRIUM           Index LA diam:        3.70 cm 1.84 cm/m   RA Area:     15.90 cm LA Vol (A2C):   59.6 ml 29.65 ml/m  RA Volume:   33.40 ml  16.62 ml/m LA Vol (A4C):   63.7 ml 31.69 ml/m LA Biplane Vol: 64.6 ml 32.14 ml/m  AORTIC VALVE LVOT Vmax:   123.00 cm/s LVOT Vmean:  83.000 cm/s LVOT VTI:    0.282 m  AORTA Ao Root diam: 3.80 cm Ao Asc diam:  3.90 cm MITRAL VALVE MV Area (PHT): 2.76 cm     SHUNTS MV Area VTI:   1.77 cm     Systemic VTI:  0.28 m MV Peak grad:  10.5 mmHg    Systemic Diam: 2.00 cm MV Mean grad:  5.0 mmHg MV Vmax:       1.62 m/s MV Vmean:      100.0 cm/s MV Decel Time: 275 msec MV E velocity: 159.00 cm/s MV A velocity: 134.00 cm/s MV E/A ratio:  1.19 Eleonore Chiquito MD Electronically signed by Eleonore Chiquito MD Signature Date/Time: 08/20/2021/12:34:20 PM    Final    CT EXTREMITY LOWER LEFT WO CONTRAST  Result Date: 08/23/2021 CLINICAL DATA:  Infection suspected, bilateral below-knee amputation. Is EXAM: CT OF THE LOWER LEFT EXTREMITY WITHOUT CONTRAST TECHNIQUE: Multidetector CT imaging of the lower left extremity was performed according to the standard protocol. COMPARISON:  None. FINDINGS: Bones/Joint/Cartilage Status post bilateral below-knee amputation. No acute osseous abnormality. Ligaments Suboptimally assessed by CT. Muscles and Tendons Mild muscle atrophy.  No intramuscular hematoma or fluid collection. Soft tissues There is mild skin thickening and subcutaneous soft tissue edema on the lateral aspect of  the proximal femur. There is also mild skin thickening and subcutaneous soft tissue edema about the amputation site. There is trace amount of fluid in the popliteal fossa without evidence of  drainable fluid collection or abscess. Prominent vascular calcifications. IMPRESSION: 1.  No evidence of drainable fluid collection or abscess.2. 2. Mild skin thickening and subcutaneous soft tissue edema about the lateral aspect of the thigh as well as about the amputation site. Electronically Signed   By: Keane Police D.O.   On: 08/23/2021 07:46   DG FEMUR MIN 2 VIEWS LEFT  Result Date: 08/13/2021 CLINICAL DATA:  Pain EXAM: LEFT FEMUR 2 VIEWS COMPARISON:  None. FINDINGS: No acute fracture or dislocation. Mild degenerative changes of the left hip joint. Extensive vascular calcifications. Calcific density in the posterior soft tissues of the lower thigh measuring up to 1.6 cm. IMPRESSION: No acute osseous abnormality. Calcific density in the posterior soft tissues of the lower thigh, possibly a foreign body or heterotopic ossification. Electronically Signed   By: Yetta Glassman M.D.   On: 08/13/2021 13:28   [place] EKG SITE RITE  Result Date: 08/26/2021 If Site Rite image not attached, placement could not be confirmed due to current cardiac rhythm.   Labs:  CBC: Recent Labs    08/28/21 0648 08/28/21 2251 08/29/21 0515 08/30/21 0254 08/31/21 0315  WBC 16.8*  --  19.2* 15.1* 9.2  HGB 7.9* 9.1* 9.3* 8.3* 7.7*  HCT 23.9* 26.9* 27.7* 25.7* 24.8*  PLT 249  --  253 275 276    COAGS: Recent Labs    08/18/21 1736 08/19/21 0529 08/23/21 0131 08/25/21 0826  INR 1.2 1.4* 1.2 1.4*  APTT 51* 48*  --   --     BMP: Recent Labs    08/28/21 0130 08/29/21 0515 08/30/21 0254 08/31/21 0315  NA 128* 131* 132* 135  K 4.2 3.9 3.8 3.6  CL 96* 101 100 106  CO2 22 22 25 24   GLUCOSE 206* 201* 191* 131*  BUN 38* 25* 24* 18  CALCIUM 7.5* 7.7* 7.5* 7.1*  CREATININE 1.51* 1.41* 1.58* 1.23  GFRNONAA 48* 52* 45* >60    LIVER FUNCTION TESTS: Recent Labs    08/19/21 0529 08/21/21 0217 08/22/21 0136 08/23/21 0131 08/24/21 0056 08/25/21 0826 08/26/21 0051 08/28/21 0130 08/29/21 0515  08/30/21 0254 08/31/21 0315  BILITOT 2.3*  --  3.2* 1.7*  --  0.9  --   --   --   --   --   AST 43*  --  65* 60*  --  26  --   --   --   --   --   ALT 14  --  20 14  --  9  --   --   --   --   --   ALKPHOS 192*  --  181* 223*  --  148*  --   --   --   --   --   PROT 7.1  --  7.5 7.5  --  6.7  --   --   --   --   --   ALBUMIN 2.2*   < > 2.2*  2.2* 2.1*  2.1*   < > 1.8*   < > 1.8* 1.8* 1.6* <1.5*   < > = values in this interval not displayed.    TUMOR MARKERS: No results for input(s): AFPTM, CEA, CA199, CHROMGRNA in the last 8760 hours.  Assessment and Plan:  Bleeding duodenal ulcer: [name], 75 year old  male, was evaluated today for possible mesenteric angiogram with intervention. His hemoglobin level this morning was 9.2 and he has not had any signs of bleeding overnight. I met with the patient to introduce IR, to briefly discuss the procedure and to notify him that he is currently stable/without bleeding and that IR will continue to follow his clinical course and be ready to intervene if necessary but for now he appears stable. He denies any abdominal pain and states he has not had any bleeding overnight.  No procedure planned today. Consent was not obtained. Please contact IR if the patient develops a drop in hemoglobin and/or signs of bleeding.  IR will continue to follow.   Thank you for this interesting consult.  I greatly enjoyed meeting Leslie [name] [name] and look forward to participating in their care.  A copy of this report was sent to the requesting provider on this date.  Electronically Signed: Soyla Dryer, AGACNP-BC [phone] 08/31/2021, 3:45 PM   I spent a total of 20 Minutes    in face to face in clinical consultation, greater than 50% of which was counseling/coordinating care for mesenteric angiogram with possible embolization.

## 2021-09-01 DIAGNOSIS — M00062 Staphylococcal arthritis, left knee: Secondary | ICD-10-CM | POA: Diagnosis not present

## 2021-09-01 LAB — BPAM RBC
Blood Product Expiration Date: 202211112359
ISSUE DATE / TIME: 202211051449
Unit Type and Rh: 9500

## 2021-09-01 LAB — RENAL FUNCTION PANEL
Albumin: 1.6 g/dL — ABNORMAL LOW (ref 3.5–5.0)
Anion gap: 5 (ref 5–15)
BUN: 17 mg/dL (ref 8–23)
CO2: 26 mmol/L (ref 22–32)
Calcium: 7.6 mg/dL — ABNORMAL LOW (ref 8.9–10.3)
Chloride: 102 mmol/L (ref 98–111)
Creatinine, Ser: 1.2 mg/dL (ref 0.61–1.24)
GFR, Estimated: >60 mL/min (ref >60)
Glucose, Bld: 133 mg/dL — ABNORMAL HIGH (ref 70–99)
Phosphorus: 2.3 mg/dL — ABNORMAL LOW (ref 2.5–4.6)
Potassium: 3.9 mmol/L (ref 3.5–5.1)
Sodium: 133 mmol/L — ABNORMAL LOW (ref 135–145)

## 2021-09-01 LAB — CBC WITH DIFFERENTIAL/PLATELET
Abs Immature Granulocytes: 0.04 10*3/uL (ref 0.00–0.07)
Basophils Absolute: 0.1 10*3/uL (ref 0.0–0.1)
Basophils Relative: 1 %
Eosinophils Absolute: 0.2 10*3/uL (ref 0.0–0.5)
Eosinophils Relative: 3 %
HCT: 28.8 % — ABNORMAL LOW (ref 39.0–52.0)
Hemoglobin: 9.2 g/dL — ABNORMAL LOW (ref 13.0–17.0)
Immature Granulocytes: 1 %
Lymphocytes Relative: 17 %
Lymphs Abs: 1.4 10*3/uL (ref 0.7–4.0)
MCH: 28.4 pg (ref 26.0–34.0)
MCHC: 31.9 g/dL (ref 30.0–36.0)
MCV: 88.9 fL (ref 80.0–100.0)
Monocytes Absolute: 1 10*3/uL (ref 0.1–1.0)
Monocytes Relative: 13 %
Neutro Abs: 5.4 10*3/uL (ref 1.7–7.7)
Neutrophils Relative %: 65 %
Platelets: 299 10*3/uL (ref 150–400)
RBC: 3.24 MIL/uL — ABNORMAL LOW (ref 4.22–5.81)
RDW: 17.7 % — ABNORMAL HIGH (ref 11.5–15.5)
WBC: 8.1 10*3/uL (ref 4.0–10.5)
nRBC: 0 % (ref 0.0–0.2)

## 2021-09-01 LAB — TYPE AND SCREEN
ABO/RH(D): O NEG
Antibody Screen: NEGATIVE
Unit division: 0

## 2021-09-01 LAB — GLUCOSE, CAPILLARY
Glucose-Capillary: 141 mg/dL — ABNORMAL HIGH (ref 70–99)
Glucose-Capillary: 156 mg/dL — ABNORMAL HIGH (ref 70–99)
Glucose-Capillary: 230 mg/dL — ABNORMAL HIGH (ref 70–99)
Glucose-Capillary: 139 mg/dL — ABNORMAL HIGH (ref 70–99)

## 2021-09-01 NOTE — Progress Notes (Signed)
Patient tentatively scheduled today for mesenteric angiogram with possible embolization. His hemoglobin this morning is 9.2 and per the day shift nurse, there were no signs of bleeding overnight. He had a large BM last night that was described as being brown.   IR will hold off on procedure today but will continue to follow patient's hemoglobin levels. If he drops his hemoglobin and/or has signs of bleeding please notify IR. This information was relayed to the Internal Medicine on-call provider.   IR will plan to see the patient today to discuss the procedure and introduce IR in the event he does need to undergo embolization.  Soyla Dryer,  [phone] 09/01/2021, 8:14 AM

## 2021-09-01 NOTE — Progress Notes (Signed)
   Subjective: I seen and evaluated Mr. Haupert at bedside. He was lying comfortably in bed. He states minimal pain in the left residual limb. He states his last bowel movement was yesterday and is unsure of the color.  Otherwise patient has no other complaints.  Objective:  Vital signs in last 24 hours: Vitals:   08/31/21 2001 08/31/21 2321 09/01/21 0357 09/01/21 0402  BP: (!) 157/97 (!) 153/64 (!) 155/78   Pulse: 100 97 100   Resp: 15 20 15    Temp: 98.5 F (36.9 C) 98.3 F (36.8 C)    TempSrc: Oral Oral    SpO2: 98% 94% 98%   Weight:    93.9 kg  Height:       Physical Exam HENT:     Head: Normocephalic and atraumatic.  Cardiovascular:     Rate and Rhythm: Normal rate and regular rhythm.     Heart sounds: Normal heart sounds.  Pulmonary:     Effort: Pulmonary effort is normal.     Breath sounds: Normal breath sounds.  Abdominal:     General: Abdomen is protuberant. Bowel sounds are normal.     Palpations: Abdomen is soft.  Musculoskeletal:     Comments: Left residual limb wrapped neatly in bandage.  No drainage noted     Right Lower Extremity: Right leg is amputated below knee.     Left Lower Extremity: Left leg is amputated below knee.  Skin:    General: Skin is warm and dry.  Neurological:     General: No focal deficit present.     Mental Status: He is easily aroused. Mental status is at baseline.  Psychiatric:        Behavior: Behavior normal. Behavior is cooperative.     Assessment/Plan:  Principal Problem:   Septic arthritis of knee (HCC) Active Problems:   S/P BKA (below knee amputation) bilateral (HCC)   Acute kidney injury ([place])   Altered mental status   S/P debridement   MSSA (methicillin susceptible Staphylococcus aureus) infection   Cellulitis of left lower extremity   Pressure injury of skin  Septic arthritis of the left knee POD 2 repeat I&D; surgical wound cultures show no growth.  Patient remains afebrile. --IV cefazolin 2 g every 8 hours-6  weeks per ID --IV Dilaudid for pain  Upper GI bleed secondary to duodenal ulcer Patient has received 3 units of blood products in the last 3 days due to continued blood loss (melena).  Today patient has not had a bowel movement yet.  Patient's current hemoglobin is 9.2 improved following blood transfusion yesterday (hgb 7.7).  Patient is hemodynamically stable.  IR would like to defer procedure at this time. --Monitor hemoglobin --IR recommendations appreciated      Prior to Admission Living Arrangement: Anticipated Discharge Location: Barriers to Discharge: Dispo: Anticipated discharge in approximately 1-2 day(s).   Timothy Lasso, MD 09/01/2021, 6:26 AM Pager: [phone] After 5pm on weekdays and 1pm on weekends: On Call pager [phone]

## 2021-09-01 NOTE — Progress Notes (Signed)
Subjective: 3 Days Post-Op Procedure(s) (LRB): IRRIGATION AND DEBRIDEMENT Left Knee (Left) Patient reports pain as  denies knee pain .  Pt tolerating regular diet.   Objective: Vital signs in last 24 hours: Temp:  [97.2 F (36.2 C)-99.4 F (37.4 C)] 98 F (36.7 C) (11/06 0807) Pulse Rate:  [97-103] 100 (11/06 0807) Resp:  [15-20] 16 (11/06 0807) BP: (150-167)/(64-109) 167/75 (11/06 0807) SpO2:  [94 %-100 %] 100 % (11/06 0807) Weight:  [93.9 kg] 93.9 kg (11/06 0402)  Intake/Output from previous day: 11/05 0701 - 11/06 0700 In: 4665.8 [I.V.:2763.7; Blood:352.1; NG/GT:1050; IV Piggyback:500] Out: 5102 [HENID:7824] Intake/Output this shift: No intake/output data recorded.  Recent Labs    08/30/21 0254 08/31/21 0315 08/31/21 1904 09/01/21 0400  HGB 8.3* 7.7* 8.6* 9.2*   Recent Labs    08/31/21 0315 08/31/21 1904 09/01/21 0400  WBC 9.2  --  8.1  RBC 2.71*  --  3.24*  HCT 24.8* 27.4* 28.8*  PLT 276  --  299   Recent Labs    08/31/21 0315 09/01/21 0400  NA 135 133*  K 3.6 3.9  CL 106 102  CO2 24 26  BUN 18 17  CREATININE 1.23 1.20  GLUCOSE 131* 133*  CALCIUM 7.1* 7.6*   No results for input(s): LABPT, INR in the last 72 hours.  PE:  Good ROM of the left knee.  Dressed and dry.  5/5 strength in flexion and extension.   Assessment/Plan: 3 Days Post-Op Procedure(s) (LRB): IRRIGATION AND DEBRIDEMENT Left Knee (Left) Continue abx per ID.  -We will engage Dr. Sharol Given of [facility] starting next week for evaluation and management recommendations.  No further surgical treatment is recommended at this time.      Nicholes Stairs 09/01/2021, 11:35 AM

## 2021-09-02 DIAGNOSIS — K264 Chronic or unspecified duodenal ulcer with hemorrhage: Secondary | ICD-10-CM | POA: Diagnosis not present

## 2021-09-02 DIAGNOSIS — M009 Pyogenic arthritis, unspecified: Secondary | ICD-10-CM | POA: Diagnosis not present

## 2021-09-02 DIAGNOSIS — Z89512 Acquired absence of left leg below knee: Secondary | ICD-10-CM | POA: Diagnosis not present

## 2021-09-02 DIAGNOSIS — Z89511 Acquired absence of right leg below knee: Secondary | ICD-10-CM | POA: Diagnosis not present

## 2021-09-02 LAB — GLUCOSE, CAPILLARY
Glucose-Capillary: 106 mg/dL — ABNORMAL HIGH (ref 70–99)
Glucose-Capillary: 184 mg/dL — ABNORMAL HIGH (ref 70–99)
Glucose-Capillary: 140 mg/dL — ABNORMAL HIGH (ref 70–99)
Glucose-Capillary: 192 mg/dL — ABNORMAL HIGH (ref 70–99)

## 2021-09-02 LAB — CBC WITH DIFFERENTIAL/PLATELET
Abs Immature Granulocytes: 0.05 10*3/uL (ref 0.00–0.07)
Basophils Absolute: 0.1 10*3/uL (ref 0.0–0.1)
Basophils Relative: 1 %
Eosinophils Absolute: 0.2 10*3/uL (ref 0.0–0.5)
Eosinophils Relative: 2 %
HCT: 26.5 % — ABNORMAL LOW (ref 39.0–52.0)
Hemoglobin: 8.5 g/dL — ABNORMAL LOW (ref 13.0–17.0)
Immature Granulocytes: 1 %
Lymphocytes Relative: 23 %
Lymphs Abs: 1.5 10*3/uL (ref 0.7–4.0)
MCH: 28.1 pg (ref 26.0–34.0)
MCHC: 32.1 g/dL (ref 30.0–36.0)
MCV: 87.5 fL (ref 80.0–100.0)
Monocytes Absolute: 0.9 10*3/uL (ref 0.1–1.0)
Monocytes Relative: 13 %
Neutro Abs: 4 10*3/uL (ref 1.7–7.7)
Neutrophils Relative %: 60 %
Platelets: 306 10*3/uL (ref 150–400)
RBC: 3.03 MIL/uL — ABNORMAL LOW (ref 4.22–5.81)
RDW: 17.1 % — ABNORMAL HIGH (ref 11.5–15.5)
WBC: 6.6 10*3/uL (ref 4.0–10.5)
nRBC: 0 % (ref 0.0–0.2)

## 2021-09-02 LAB — VITAMIN C: Vitamin C: 0.1 mg/dL — ABNORMAL LOW (ref 0.4–2.0)

## 2021-09-02 LAB — RENAL FUNCTION PANEL
Albumin: <1.5 g/dL — ABNORMAL LOW (ref 3.5–5.0)
Anion gap: 6 (ref 5–15)
BUN: 16 mg/dL (ref 8–23)
CO2: 27 mmol/L (ref 22–32)
Calcium: 7.6 mg/dL — ABNORMAL LOW (ref 8.9–10.3)
Chloride: 99 mmol/L (ref 98–111)
Creatinine, Ser: 1.18 mg/dL (ref 0.61–1.24)
GFR, Estimated: >60 mL/min (ref >60)
Glucose, Bld: 98 mg/dL (ref 70–99)
Phosphorus: 2.4 mg/dL — ABNORMAL LOW (ref 2.5–4.6)
Potassium: 3.9 mmol/L (ref 3.5–5.1)
Sodium: 132 mmol/L — ABNORMAL LOW (ref 135–145)

## 2021-09-02 NOTE — Anesthesia Postprocedure Evaluation (Signed)
Anesthesia Post Note  Patient: Leslie [name]  Procedure(s) Performed: IRRIGATION AND DEBRIDEMENT Left Knee (Left: Knee)     Patient location during evaluation: PACU Anesthesia Type: General Level of consciousness: awake and alert Pain management: pain level controlled Vital Signs Assessment: post-procedure vital signs reviewed and stable Respiratory status: spontaneous breathing, nonlabored ventilation, respiratory function stable and patient connected to nasal cannula oxygen Cardiovascular status: blood pressure returned to baseline and stable Postop Assessment: no apparent nausea or vomiting Anesthetic complications: no   No notable events documented.  Last Vitals:  Vitals:   09/01/21 2334 09/02/21 0356  BP: 106/88 (!) 176/74  Pulse: 100 97  Resp: 19 18  Temp: 36.8 C 36.7 C  SpO2: 99% 99%    Last Pain:  Vitals:   09/02/21 0356  TempSrc: Oral  PainSc:                  Catalina Gravel

## 2021-09-02 NOTE — Progress Notes (Addendum)
Patient was scheduled for mesenteric angiogram with possible embolization 08/31/21. Procedure was placed on hold 11/6 d/t no melena and no signs of bleeding. Last BM 11/5 was normal in color. Pt stable today with Hbg 8.2 slightly down from 9.5 yesterday. Per RN, no signs of bleeding noted today. Pt reports that he feels like he needs to have a BM but cannot. Pt denies abd pain, nausea or vomiting at this time. Pt states that he is eating/drinking normally.   IR to continue to follow to see if there is a need for intervention at a later time.    Narda Rutherford, AGNP-BC 09/02/2021, 12:15 PM

## 2021-09-02 NOTE — Progress Notes (Addendum)
   Subjective: I  evaluated Leslie [name] at bedside.  Leslie [name] states that Leslie [name] is feeling better today.  Leslie [name] reports mild discomfort in his left residual limb.  Leslie [name] is A&Ox3 and currently at baseline.  Objective:  Vital signs in last 24 hours: Vitals:   09/01/21 1956 09/01/21 2334 09/02/21 0356 09/02/21 0727  BP: (!) 167/86 106/88 (!) 176/74 (!) 179/78  Pulse: 100 100 97 98  Resp: 19 19 18 16   Temp: 98 F (36.7 C) 98.3 F (36.8 C) 98 F (36.7 C) 98.2 F (36.8 C)  TempSrc: Oral Oral Oral Oral  SpO2: 93% 99% 99% 97%  Weight:   87 kg   Height:       Physical Exam HENT:     Head: Normocephalic and atraumatic.  Cardiovascular:     Rate and Rhythm: Normal rate and regular rhythm.     Heart sounds: Normal heart sounds.  Pulmonary:     Effort: Pulmonary effort is normal.     Breath sounds: Normal breath sounds.  Abdominal:     General: Abdomen is protuberant.     Palpations: Abdomen is soft.  Genitourinary:    Comments: Foley present Musculoskeletal:     Comments: Medial aspect of left residual limb-mild edema present; erythema and warm.  Appears to be improving.     Right Lower Extremity: Right leg is amputated below knee.     Left Lower Extremity: Left leg is amputated below knee.  Skin:    General: Skin is warm and dry.  Neurological:     Mental Status: Leslie [name] is alert. Mental status is at baseline.  Psychiatric:        Attention and Perception: Attention normal.        Behavior: Behavior normal. Behavior is cooperative.      Assessment/Plan:  Principal Problem:   Septic arthritis of knee (HCC) Active Problems:   S/P BKA (below knee amputation) bilateral (HCC)   Acute kidney injury ([place])   Altered mental status   S/P debridement   MSSA (methicillin susceptible Staphylococcus aureus) infection   Cellulitis of left lower extremity   Pressure injury of skin  Septic arthritis of the left knee POD 3 repeat IND; surgical wound culture showed no growth.  Leslie [name] remains afebrile.  Leslie [name] reports mild tenderness in the left residual limb.  Wound seems to be healing well.  Less swelling and erythema. -- IV cefazolin 2 g every 8 hours-6 weeks per ID -- IV Dilaudid for pain  Upper GI bleed secondary to duodenal ulcer Leslie [name] has received 3 units of blood products in the last 4 days due to continued blood loss (melena).  Hemoglobin continues to fluctuate.  Current hemoglobin 8.5 (9.2 yesterday).  According to the nurse, yesterday Leslie [name] did not have any melena.  But the day prior to that Leslie [name] did experience a few bouts of melena.  We will continue to monitor.  IR has deferred surgical intervention at this time due to Leslie [name] being hemodynamically stable following blood transfusion. --Monitor hemoglobin --IR recommendations appreciated   Prior to Admission Living Arrangement: Anticipated Discharge Location: Barriers to Discharge: Dispo: Anticipated discharge in approximately 1-2 day(s).   Timothy Lasso, MD 09/02/2021, 10:33 AM Pager: [phone] After 5pm on weekdays and 1pm on weekends: On Call pager [phone]

## 2021-09-02 NOTE — Progress Notes (Signed)
Physical Therapy Treatment Patient Details Name: Leslie [name] MRN: [id number] DOB: 1945/12/27 Today's Date: 09/02/2021   History of Present Illness Pt is a 75 y.o. male admitted 08/18/21 with AMS and L knee pain; workup for L knee sepsis. S/p L knee aspiration, then L knee I&D on 10/24. Course complicated by encephalopathy. MRI L femur 11/2 shows no osteomyelitis; L knee effusion with Baker's cyst; pt declined L knee MRI. PMH includes PAD s/p bilateral BKA (left 2017, right 2019), cellulitis, COPD, gout, arthritis, DM2.    PT Comments    The pt was agreeable to session and make good progress with OOB transfers this session now that he has RLE prosthetic from home. He continues to require modA to complete sit-stand transfers and squat pivot transfers from elevated surface due to poor strength, power, and stability at this time. He was able to don R prosthetic without LOB, but needed assist for fine motor components. Due to level of assist needed and high risk of falls during transfers without assist available, I continue to recommend SNF rehab at d/c, however the pt continues to adamantly decline SNF rehab and voices preference for home without follow-up services. Will continue to progress pt mobility and strength to allow for safest possible d/c home.     Recommendations for follow up therapy are one component of a multi-disciplinary discharge planning process, led by the attending physician.  Recommendations may be updated based on patient status, additional functional criteria and insurance authorization.  Follow Up Recommendations  Skilled nursing-short term rehab (<3 hours/day) (pt continues to decline follow-up services, would benefit from PT through [facility] program if agreeable)     Assistance Recommended at Discharge Intermittent Supervision/Assistance  Equipment Recommendations  None recommended by PT    Recommendations for Other Services       Precautions / Restrictions  Precautions Precautions: Fall;Other (comment) Precaution Comments: chronic bilateral BKA (has RLE prosthetic in room, but no socks/liners); significant h/o falls Restrictions Weight Bearing Restrictions: Yes LLE Weight Bearing: Non weight bearing     Mobility  Bed Mobility Overal bed mobility: Needs Assistance Bed Mobility: Rolling;Sit to Sidelying Rolling: Modified independent (Device/Increase time)   Supine to sit: Mod assist;HOB elevated     General bed mobility comments: modA to manage trunk elevation to sitting, even with HOB slightly elevated. pt with increased exertion and WOB    Transfers Overall transfer level: Needs assistance Equipment used: Rolling walker (2 wheels) Transfers: Sit to/from Stand;Bed to chair/wheelchair/BSC Sit to Stand: Mod assist;From elevated surface   Squat pivot transfers: Mod assist       General transfer comment: pt needing elevation of EOB to complete sit-stand with use of RUE to power up from EOB. Then needing moda to steady while performing squat pivot to R to chair. cues for optimal hand positioning, wt shift to RLE    Ambulation/Gait               General Gait Details: pt unable, minA to steady in static stance       Balance Overall balance assessment: Needs assistance;History of Falls Sitting-balance support: No upper extremity supported;Feet unsupported Sitting balance-Leahy Scale: Fair Sitting balance - Comments: able to reach outside BOS to don prosthetic RLE   Standing balance support: Bilateral upper extremity supported Standing balance-Leahy Scale: Poor Standing balance comment: dependent on BUE support  Cognition Arousal/Alertness: Awake/alert Behavior During Therapy: Flat affect Overall Cognitive Status: No family/caregiver present to determine baseline cognitive functioning Area of Impairment: Attention;Following commands;Safety/judgement;Awareness                    Current Attention Level: Selective Memory: Decreased short-term memory Following Commands: Follows one step commands consistently;Follows multi-step commands with increased time Safety/Judgement: Decreased awareness of safety;Decreased awareness of deficits Awareness: Emergent Problem Solving: Slow processing General Comments: pt with good ability to problem solve transfers to chair with use of prosthetic and good awareness of safety features (brakes on chairs) and line awareness.        Exercises      General Comments General comments (skin integrity, edema, etc.): VSS on RA      Pertinent Vitals/Pain Pain Assessment: Faces Faces Pain Scale: Hurts little more Pain Location: L knee resting against bed Pain Descriptors / Indicators: Discomfort Pain Intervention(s): Limited activity within patient's tolerance;Monitored during session;Repositioned;RN gave pain meds during session     PT Goals (current goals can now be found in the care plan section) Acute Rehab PT Goals Patient Stated Goal: get back to his dog PT Goal Formulation: With patient Time For Goal Achievement: 09/07/21 Potential to Achieve Goals: Fair Progress towards PT goals: Progressing toward goals    Frequency    Min 3X/week      PT Plan Current plan remains appropriate       AM-PAC PT "6 Clicks" Mobility   Outcome Measure  Help needed turning from your back to your side while in a flat bed without using bedrails?: A Little Help needed moving from lying on your back to sitting on the side of a flat bed without using bedrails?: A Lot Help needed moving to and from a bed to a chair (including a wheelchair)?: A Lot Help needed standing up from a chair using your arms (e.g., wheelchair or bedside chair)?: A Lot Help needed to walk in hospital room?: Total Help needed climbing 3-5 steps with a railing? : Total 6 Click Score: 11    End of Session Equipment Utilized During Treatment: Gait belt;Other  (comment) (R prosthetic) Activity Tolerance: Patient tolerated treatment well Patient left: in chair;with call bell/phone within reach;with chair alarm set Nurse Communication: Mobility status PT Visit Diagnosis: Muscle weakness (generalized) (M62.81);Other abnormalities of gait and mobility (R26.89);Repeated falls (R29.6);Pain Pain - Right/Left: Left Pain - part of body: Knee     Time: 7867-6720 PT Time Calculation (min) (ACUTE ONLY): 32 min  Charges:  $Therapeutic Activity: 23-37 mins                     West Carbo, PT, DPT   Acute Rehabilitation Department Pager #: [phone]   Sandra Cockayne 09/02/2021, 10:03 AM

## 2021-09-03 LAB — CBC WITH DIFFERENTIAL/PLATELET
Abs Immature Granulocytes: 0.03 10*3/uL (ref 0.00–0.07)
Basophils Absolute: 0 10*3/uL (ref 0.0–0.1)
Basophils Relative: 1 %
Eosinophils Absolute: 0.1 10*3/uL (ref 0.0–0.5)
Eosinophils Relative: 2 %
HCT: 25.7 % — ABNORMAL LOW (ref 39.0–52.0)
Hemoglobin: 8.1 g/dL — ABNORMAL LOW (ref 13.0–17.0)
Immature Granulocytes: 1 %
Lymphocytes Relative: 22 %
Lymphs Abs: 1.2 10*3/uL (ref 0.7–4.0)
MCH: 27.6 pg (ref 26.0–34.0)
MCHC: 31.5 g/dL (ref 30.0–36.0)
MCV: 87.7 fL (ref 80.0–100.0)
Monocytes Absolute: 0.8 10*3/uL (ref 0.1–1.0)
Monocytes Relative: 15 %
Neutro Abs: 3.2 10*3/uL (ref 1.7–7.7)
Neutrophils Relative %: 59 %
Platelets: 336 10*3/uL (ref 150–400)
RBC: 2.93 MIL/uL — ABNORMAL LOW (ref 4.22–5.81)
RDW: 16.5 % — ABNORMAL HIGH (ref 11.5–15.5)
WBC: 5.4 10*3/uL (ref 4.0–10.5)
nRBC: 0 % (ref 0.0–0.2)

## 2021-09-03 LAB — RENAL FUNCTION PANEL
Albumin: 1.6 g/dL — ABNORMAL LOW (ref 3.5–5.0)
Anion gap: 8 (ref 5–15)
BUN: 18 mg/dL (ref 8–23)
CO2: 26 mmol/L (ref 22–32)
Calcium: 8 mg/dL — ABNORMAL LOW (ref 8.9–10.3)
Chloride: 101 mmol/L (ref 98–111)
Creatinine, Ser: 1.29 mg/dL — ABNORMAL HIGH (ref 0.61–1.24)
GFR, Estimated: 58 mL/min — ABNORMAL LOW (ref >60)
Glucose, Bld: 119 mg/dL — ABNORMAL HIGH (ref 70–99)
Phosphorus: 2.7 mg/dL (ref 2.5–4.6)
Potassium: 3.6 mmol/L (ref 3.5–5.1)
Sodium: 135 mmol/L (ref 135–145)

## 2021-09-03 LAB — AEROBIC/ANAEROBIC CULTURE W GRAM STAIN (SURGICAL/DEEP WOUND): Gram Stain: NONE SEEN

## 2021-09-03 LAB — VITAMIN B6: Vitamin B6: 1.3 ug/L — ABNORMAL LOW (ref 3.4–65.2)

## 2021-09-03 LAB — GLUCOSE, CAPILLARY
Glucose-Capillary: 98 mg/dL (ref 70–99)
Glucose-Capillary: 141 mg/dL — ABNORMAL HIGH (ref 70–99)
Glucose-Capillary: 199 mg/dL — ABNORMAL HIGH (ref 70–99)
Glucose-Capillary: 121 mg/dL — ABNORMAL HIGH (ref 70–99)

## 2021-09-03 MED ORDER — METOPROLOL SUCCINATE ER 100 MG PO TB24
100.0000 mg | ORAL_TABLET | Freq: Every day | ORAL | Status: DC
  Administered 2021-09-03 – 2021-09-13 (×11): 100 mg via ORAL
  Filled 2021-09-03 (×11): qty 1

## 2021-09-03 MED ORDER — VITAMIN B-6 100 MG PO TABS
100.0000 mg | ORAL_TABLET | Freq: Every day | ORAL | Status: DC
  Administered 2021-09-03 – 2021-09-13 (×11): 100 mg via ORAL
  Filled 2021-09-03 (×11): qty 1

## 2021-09-03 MED ORDER — CLONAZEPAM 0.25 MG PO TBDP
0.2500 mg | ORAL_TABLET | Freq: Every day | ORAL | Status: AC
  Administered 2021-09-06 – 2021-09-07 (×2): 0.25 mg via ORAL
  Filled 2021-09-03: qty 1

## 2021-09-03 MED ORDER — HYDRALAZINE HCL 25 MG PO TABS: 25.0000 mg | ORAL_TABLET | Freq: Three times a day (TID) | ORAL | Status: DC

## 2021-09-03 MED ORDER — CLONAZEPAM 0.25 MG PO TBDP
0.5000 mg | ORAL_TABLET | Freq: Every day | ORAL | Status: AC
  Administered 2021-09-04 – 2021-09-05 (×2): 0.5 mg via ORAL
  Filled 2021-09-03 (×4): qty 2

## 2021-09-03 MED ORDER — HYDRALAZINE HCL 25 MG PO TABS: 25.0000 mg | ORAL_TABLET | Freq: Once | ORAL | Status: DC | PRN

## 2021-09-03 MED ORDER — SENNOSIDES-DOCUSATE SODIUM 8.6-50 MG PO TABS
1.0000 | ORAL_TABLET | Freq: Every day | ORAL | Status: DC
  Administered 2021-09-03 – 2021-09-12 (×10): 1 via ORAL
  Filled 2021-09-03 (×10): qty 1

## 2021-09-03 MED ORDER — ASCORBIC ACID 500 MG PO TABS
500.0000 mg | ORAL_TABLET | Freq: Every day | ORAL | Status: DC
  Administered 2021-09-03 – 2021-09-10 (×8): 500 mg via ORAL
  Filled 2021-09-03 (×9): qty 1

## 2021-09-03 MED ORDER — CLONAZEPAM 0.125 MG PO TBDP
0.1250 mg | ORAL_TABLET | Freq: Every day | ORAL | Status: AC
  Administered 2021-09-08 – 2021-09-10 (×3): 0.125 mg via ORAL
  Filled 2021-09-03 (×3): qty 1

## 2021-09-03 NOTE — Progress Notes (Signed)
Nutrition Follow-up / Consult  DOCUMENTATION CODES:   Not applicable  INTERVENTION:   Continue Ensure Enlive po TID, each supplement provides 350 kcal and 20 grams of protein. Awaiting results of vitamin A lab.  Start vitamin B6 and vitamin C supplementation. Continue MVI with minerals daily.  NUTRITION DIAGNOSIS:   Increased nutrient needs related to wound healing as evidenced by estimated needs.  GOAL:   Patient will meet greater than or equal to 90% of their needs  MONITOR:   PO intake, Supplement acceptance, Labs, Skin  REASON FOR ASSESSMENT:   Consult Enteral/tube feeding initiation and management  ASSESSMENT:   75 yo male admitted with septic L knee, acute encephalopathy c/b AKI. PMH includes HTN, HLD, DM-2, COPD, bilateral BKAs, alcohol use disorder, PAD, hepatitis.  Patient sitting up in bed eating lunch during RD visit. He says he is eating well, "when they give me something to eat." He likes the Ensure supplements; has been drinking 2 per day. Meal completions documented at 0-50%.   Labs reviewed. CBG: [phone]  Vitamin Panel Vitamin B-6: 1.3 Low Vitamin C: 0.1 Low Vitamin A: result pending Zinc: 54 WNL Folate: 6.9 WNL  Medications reviewed and include Colace, Pepcid, folic acid, novolog, Semglee, Remeron, MVI with minerals, Protonix, sucralfate, thiamine.  Admission weight 90.1 kg Current weight 89 kg   Diet Order:   Diet Order             Diet heart healthy/carb modified Room service appropriate? Yes with Assist; Fluid consistency: Thin  Diet effective now                   EDUCATION NEEDS:   Not appropriate for education at this time  Skin:  Skin Assessment: Skin Integrity Issues: Skin Integrity Issues:: Stage II, Other (Comment) Stage II: sacrum Other: L leg wound at BKA site  Last BM:  11/7  Height:   Ht Readings from Last 1 Encounters:  08/24/21 5\' 6"  (1.676 m)    Weight:   Wt Readings from Last 1 Encounters:   09/03/21 89 kg    Estimated Nutritional Needs:   Kcal:  1900-2100  Protein:  105-125 gm  Fluid:  >/= 2.1 L    Lucas Mallow, RD, LDN, CNSC Please refer to Amion for contact information.

## 2021-09-03 NOTE — Care Management Important Message (Signed)
Important Message  Patient Details  Name: Leslie [name] MRN: [id number] Date of Birth: 06-16-1946   Medicare Important Message Given:  Yes     Shelda Altes 09/03/2021, 1:18 PM

## 2021-09-03 NOTE — Progress Notes (Addendum)
Subjective: I evaluated Leslie [name] at bedside. He was in a better mood today. He states that his knee pain has improved greatly, he denied any pain today. He states that he continues to experience odynophagia. He characterizes the pain as 8/10 when swallowing "too fast". However, states when he swallows "slowly", it is non-painful. He denies fever or chills.  Objective:  Vital signs in last 24 hours: Vitals:   09/02/21 1659 09/02/21 2016 09/02/21 2342 09/03/21 0320  BP: (!) 158/83 (!) 141/76 (!) 173/84 (!) 178/96  Pulse: (!) 102 95 90 88  Resp: 16 20 20 20   Temp: 97.9 F (36.6 C) 97.8 F (36.6 C) 98.2 F (36.8 C) 98.2 F (36.8 C)  TempSrc: Oral Oral Oral Oral  SpO2: 100% 100% 100% 100%  Weight:    89 kg  Height:       Physical Exam Constitutional:      General: He is not in acute distress. HENT:     Head: Normocephalic and atraumatic.  Neck:     Thyroid: No thyroid mass, thyromegaly or thyroid tenderness.  Cardiovascular:     Rate and Rhythm: Normal rate and regular rhythm.     Heart sounds: Normal heart sounds.  Pulmonary:     Effort: Pulmonary effort is normal.     Breath sounds: Normal breath sounds.  Abdominal:     General: Abdomen is protuberant.  Musculoskeletal:     Comments: Left medial aspect of the knee- red, warm fluctuant fluid collection present. NO pain with palpation, no drainage, no surrounding edema of the knee      Right Lower Extremity: Right leg is amputated below knee.     Left Lower Extremity: Left leg is amputated below knee.  Lymphadenopathy:     Cervical: Cervical adenopathy present.     Right cervical: No superficial or deep cervical adenopathy.    Left cervical: No superficial or deep cervical adenopathy.  Skin:    General: Skin is warm and dry.  Neurological:     General: No focal deficit present.     Mental Status: He is alert. Mental status is at baseline.  Psychiatric:        Attention and Perception: Attention normal.         Behavior: Behavior normal. Behavior is cooperative.     Assessment/Plan:  Principal Problem:   Septic arthritis of knee (HCC) Active Problems:   S/P BKA (below knee amputation) bilateral (HCC)   Acute kidney injury ([place])   Altered mental status   S/P debridement   MSSA (methicillin susceptible Staphylococcus aureus) infection   Cellulitis of left lower extremity   Pressure injury of skin  Septic arthritis of the left knee POD 4 repeat I&D; Patient denies any pain of the left knee. -- IV cefazolin 2 g every 8 hours-6 weeks per ID -- IV Dilaudid for pain   Upper GI bleed secondary to duodenal ulcer Patient's hgb continues to trend down. Although patient remains hemodynamically stable.  Current hgb 8.1 (8.5<<<9.2). Patient has not had a BM since yesterday. Consider re-consulting IR if Hgb continues to trend down. --Transfuse if hgb < 8 --Monitor hemoglobin --Monitor for further episodes of melena --IR recommendations appreciated  Hypertension Overnight patient's BP remained elevated despite being given hydralazine PRN in addition to is current regimen. SBP>170 and DBP>80. Over the past week, patient SBP has been chronically elevated in the140s-150s. Initially attributed to knee pain and recent procedure. However, currently w/o pain and POD4 patient BP is  still uncontrolled. --Continue amlodipine 10mg  daily --Continue losartan 100mg  daily --Continue hydralazine PRN (if BP >130/90) --Started on Toprol 100mg  (half of original home dose)  Medication adjustment Upon review of medication, patient has been on clonazepam since admission. Such long acting benzodiazepine are typically avoided in older individuals due to increase risk of adverse effects. --Taper clonazepam off    Prior to Admission Living Arrangement: Anticipated Discharge Location: Barriers to Discharge: Dispo: Anticipated discharge in approximately 1-2 day(s).   Leslie Lasso, MD 09/03/2021, 7:16 AM Pager:  [phone] After 5pm on weekdays and 1pm on weekends: On Call pager [phone]

## 2021-09-03 NOTE — Plan of Care (Signed)

## 2021-09-03 NOTE — Discharge Instructions (Signed)
[facility] Stay Proper nutrition can help your body recover from illness and injury.   Foods and beverages high in protein, vitamins, and minerals help rebuild muscle loss, promote healing, & reduce fall risk.   In addition to eating healthy foods, a nutrition shake is an easy, delicious way to get the nutrition you need during and after your hospital stay  It is recommended that you continue to drink 2 bottles per day of:       Ensure Plus for at least 1 month (30 days) after your hospital stay   Tips for adding a nutrition shake into your routine: As allowed, drink one with vitamins or medications instead of water or juice Enjoy one as a tasty mid-morning or afternoon snack Drink cold or make a milkshake out of it Drink one instead of milk with cereal or snacks Use as a coffee creamer   Available at the following grocery stores and pharmacies:           * [place] [phone]            For COUPONS visit: www.ensure.com/join or [email or web address]   Suggested Substitutions Ensure Plus = Boost Plus = Carnation Breakfast Essentials = Boost Compact  Take a multivitamin every day.

## 2021-09-04 ENCOUNTER — Ambulatory Visit: Payer: Medicare (Managed Care) | Admitting: Cardiovascular Disease

## 2021-09-04 DIAGNOSIS — K264 Chronic or unspecified duodenal ulcer with hemorrhage: Secondary | ICD-10-CM | POA: Diagnosis not present

## 2021-09-04 DIAGNOSIS — Z89512 Acquired absence of left leg below knee: Secondary | ICD-10-CM | POA: Diagnosis not present

## 2021-09-04 DIAGNOSIS — M009 Pyogenic arthritis, unspecified: Secondary | ICD-10-CM | POA: Diagnosis not present

## 2021-09-04 DIAGNOSIS — I10 Essential (primary) hypertension: Secondary | ICD-10-CM

## 2021-09-04 DIAGNOSIS — Z89511 Acquired absence of right leg below knee: Secondary | ICD-10-CM | POA: Diagnosis not present

## 2021-09-04 LAB — CBC
HCT: 24.6 % — ABNORMAL LOW (ref 39.0–52.0)
Hemoglobin: 8 g/dL — ABNORMAL LOW (ref 13.0–17.0)
MCH: 28.6 pg (ref 26.0–34.0)
MCHC: 32.5 g/dL (ref 30.0–36.0)
MCV: 87.9 fL (ref 80.0–100.0)
Platelets: 370 10*3/uL (ref 150–400)
RBC: 2.8 MIL/uL — ABNORMAL LOW (ref 4.22–5.81)
RDW: 16.7 % — ABNORMAL HIGH (ref 11.5–15.5)
WBC: 6.9 10*3/uL (ref 4.0–10.5)
nRBC: 0 % (ref 0.0–0.2)

## 2021-09-04 LAB — GLUCOSE, CAPILLARY
Glucose-Capillary: 160 mg/dL — ABNORMAL HIGH (ref 70–99)
Glucose-Capillary: 148 mg/dL — ABNORMAL HIGH (ref 70–99)
Glucose-Capillary: 147 mg/dL — ABNORMAL HIGH (ref 70–99)

## 2021-09-04 LAB — SARS CORONAVIRUS 2 (TAT 6-24 HRS): SARS Coronavirus 2: NEGATIVE

## 2021-09-04 LAB — RENAL FUNCTION PANEL
Albumin: 1.6 g/dL — ABNORMAL LOW (ref 3.5–5.0)
Anion gap: 10 (ref 5–15)
BUN: 27 mg/dL — ABNORMAL HIGH (ref 8–23)
CO2: 26 mmol/L (ref 22–32)
Calcium: 8.2 mg/dL — ABNORMAL LOW (ref 8.9–10.3)
Chloride: 97 mmol/L — ABNORMAL LOW (ref 98–111)
Creatinine, Ser: 1.3 mg/dL — ABNORMAL HIGH (ref 0.61–1.24)
GFR, Estimated: 57 mL/min — ABNORMAL LOW (ref >60)
Glucose, Bld: 158 mg/dL — ABNORMAL HIGH (ref 70–99)
Phosphorus: 3.1 mg/dL (ref 2.5–4.6)
Potassium: 3.7 mmol/L (ref 3.5–5.1)
Sodium: 133 mmol/L — ABNORMAL LOW (ref 135–145)

## 2021-09-04 MED ORDER — LACTATED RINGERS IV BOLUS
1000.0000 mL | Freq: Once | INTRAVENOUS | Status: AC
  Administered 2021-09-04: 1000 mL via INTRAVENOUS

## 2021-09-04 NOTE — TOC Progression Note (Signed)
Transition of Care [facility]) - Progression Note    Patient Details  Name: Leslie [name] MRN: [id number] Date of Birth: 08/11/1946  Transition of Care [facility] CM/SW [place], [place] Phone Number: 09/04/2021, 2:13 PM  Clinical Narrative:     Informed [place]- patient will not discharge today.  Thurmond Butts, MSW, LCSW Clinical Social Worker    Expected Discharge Plan: Skilled Nursing Facility Barriers to Discharge: Continued Medical Work up, SNF Pending bed offer  Expected Discharge Plan and Services Expected Discharge Plan: [place] arrangements for the past 2 months: Single Family Home                                       Social Determinants of Health (SDOH) Interventions    Readmission Risk Interventions No flowsheet data found.

## 2021-09-04 NOTE — Progress Notes (Signed)
Occupational Therapy Treatment Patient Details Name: Leslie [name] MRN: [id number] DOB: 1946/01/11 Today's Date: 09/04/2021   History of present illness Pt is a 75 y.o. male admitted 08/18/21 with AMS and L knee pain; workup for L knee sepsis. S/p L knee aspiration, then L knee I&D on 10/24. Course complicated by encephalopathy. MRI L femur 11/2 shows no osteomyelitis; L knee effusion with Baker's cyst; pt declined L knee MRI. PMH includes PAD s/p bilateral BKA (left 2017, right 2019), cellulitis, COPD, gout, arthritis, DM2.   OT comments  Patient continues to make gradual progress towards goals in skilled OT session. Patient's session encompassed theraband exercises due to patient's flat affect and limited participation in session. Pt reporting fatigue at beginning of session, and with convincing, willing to attempt bed level exercises. Pt able to complete exercises with rest breaks in between, and admitting to therapist, "its been a while since I had a visitor in here". Therapist offered to patient to call one of the numbers listed in his room (family and friends) with minimal acknowledgment noted. Discharge recommendation remains appropriate, therapy will continue to follow.    Recommendations for follow up therapy are one component of a multi-disciplinary discharge planning process, led by the attending physician.  Recommendations may be updated based on patient status, additional functional criteria and insurance authorization.    Follow Up Recommendations  Skilled nursing-short term rehab (<3 hours/day)    Assistance Recommended at Discharge Frequent or constant Supervision/Assistance  Equipment Recommendations  Other (comment) (defer to next venue)    Recommendations for Other Services      Precautions / Restrictions Precautions Precautions: Fall;Other (comment) Precaution Comments: chronic bilateral BKA (has RLE prosthetic in room); significant h/o falls Restrictions Other  Position/Activity Restrictions: S/p L knee aspiration, then L knee I&D on 10/24       Mobility Bed Mobility Overal bed mobility: Needs Assistance   Rolling: Modified independent (Device/Increase time)              Transfers                         Balance Overall balance assessment: Needs assistance;History of Falls Sitting-balance support: No upper extremity supported;Feet unsupported Sitting balance-Leahy Scale: Fair                                     ADL either performed or assessed with clinical judgement   ADL Overall ADL's : Needs assistance/impaired     Grooming: Set up;Sitting;Bed level Grooming Details (indicate cue type and reason): able to use BUEs in order to sit upright in bed to reach items                               General ADL Comments: pt with minimal participation in session, agreeable to theraband exercises    Extremity/Trunk Assessment              Vision       Perception     Praxis      Cognition Arousal/Alertness: Awake/alert Behavior During Therapy: Flat affect Overall Cognitive Status: Within Functional Limits for tasks assessed                         Following Commands: Follows one step commands consistently  General Comments: pt with flat affect and minimally responsive but able to follow all commands and demonstrate appropriate conversation during session          Exercises General Exercises - Upper Extremity Shoulder ABduction: Strengthening;Both;20 reps;Theraband;Seated Theraband Level (Shoulder Abduction): Level 2 (Red) Shoulder ADduction: Strengthening;Both;20 reps;Theraband;Seated Theraband Level (Shoulder Adduction): Level 2 (Red) Shoulder Horizontal ABduction: AROM;20 reps;Seated;Theraband Theraband Level (Shoulder Horizontal Abduction): Level 2 (Red) Shoulder Horizontal ADduction: AROM;20 reps;Seated;Theraband Theraband Level (Shoulder Horizontal  Adduction): Level 2 (Red) Other Exercises Other Exercises: Serratus punches x20 BUEs   Shoulder Instructions       General Comments      Pertinent Vitals/ Pain       Pain Assessment: Faces Faces Pain Scale: No hurt  Home Living                                          Prior Functioning/Environment              Frequency  Min 2X/week        Progress Toward Goals  OT Goals(current goals can now be found in the care plan section)  Progress towards OT goals: Progressing toward goals  Acute Rehab OT Goals Patient Stated Goal: to go home OT Goal Formulation: With patient Time For Goal Achievement: 09/07/21 Potential to Achieve Goals: [place] Discharge plan remains appropriate    Co-evaluation                 AM-PAC OT "6 Clicks" Daily Activity     Outcome Measure   Help from another person eating meals?: None Help from another person taking care of personal grooming?: A Little Help from another person toileting, which includes using toliet, bedpan, or urinal?: A Lot Help from another person bathing (including washing, rinsing, drying)?: A Lot Help from another person to put on and taking off regular upper body clothing?: A Little Help from another person to put on and taking off regular lower body clothing?: A Lot 6 Click Score: 16    End of Session Equipment Utilized During Treatment: Other (comment) (theraband)  OT Visit Diagnosis: Other abnormalities of gait and mobility (R26.89);Muscle weakness (generalized) (M62.81);History of falling (Z91.81);Pain   Activity Tolerance Patient tolerated treatment well   Patient Left in bed;with call bell/phone within reach;with bed alarm set   Nurse Communication Mobility status        Time: 0973-5329 OT Time Calculation (min): 11 min  Charges: OT General Charges $OT Visit: 1 Visit OT Treatments $Therapeutic Exercise: 8-22 mins  Corinne Ports E. Lamont, [place] Acute Rehabilitation  Services [place] 09/04/2021, 10:24 AM

## 2021-09-04 NOTE — Progress Notes (Signed)
Physical Therapy Treatment Patient Details Name: Leslie [name] MRN: [id number] DOB: [birth year] Today's Date: 09/04/2021   History of Present Illness Pt is a 75 y.o. male admitted 08/18/21 with AMS and L knee pain; workup for L knee sepsis. S/p L knee aspiration, then L knee I&D on 10/24 and again on 11/3. Course complicated by encephalopathy. MRI L femur 11/2 shows no osteomyelitis; L knee effusion with Baker's cyst; pt declined L knee MRI. PMH includes PAD s/p bilateral BKA (left 2017, right 2019), cellulitis, COPD, gout, arthritis, DM2.    PT Comments    Pt is progressing with mobility. Today's session consisted of bed mobility. Pt attempted to don RLE prosthetic to progress to OOB mobility, but unable to complete secondary to catheter placement (RN notified).  Further mobility limited by pt's flat affect, distracted by discomfort from hospital bed. Continue to recommend SNF at d/c, but pt continues to voice preference for home without follow up services. Will continue to follow acutely to address short-term PT goals.  Recommendations for follow up therapy are one component of a multi-disciplinary discharge planning process, led by the attending physician.  Recommendations may be updated based on patient status, additional functional criteria and insurance authorization.  Follow Up Recommendations  Skilled nursing-short term rehab (<3 hours/day)     Assistance Recommended at Discharge Intermittent Supervision/Assistance  Equipment Recommendations  None recommended by PT    Recommendations for Other Services       Precautions / Restrictions Precautions Precautions: Fall Precaution Comments: chronic bilateral BKA (has RLE prosthetic in room); significant h/o falls     Mobility  Bed Mobility Overal bed mobility: Needs Assistance   Rolling: Modified independent (Device/Increase time)   Supine to sit: Supervision;HOB elevated Sit to supine: HOB elevated;Supervision   General bed  mobility comments: pt used railings to help roll and elevate into sitting    Transfers                        Ambulation/Gait                   Stairs             Wheelchair Mobility    Modified Rankin (Stroke Patients Only)       Balance Overall balance assessment: Needs assistance;History of Falls Sitting-balance support: No upper extremity supported;Feet unsupported Sitting balance-Leahy Scale: Good Sitting balance - Comments: able to sit EOB and reach outside BOS to don prosthetic sleeve       Standing balance comment: unable to don RLE prosthetic due to catheter placement so did not stand this session                            Cognition Arousal/Alertness: Awake/alert Behavior During Therapy: Flat affect Overall Cognitive Status: Impaired/Different from baseline Area of Impairment: Attention;Following commands;Safety/judgement;Awareness                   Current Attention Level: Selective Memory: Decreased short-term memory Following Commands: Follows one step commands consistently Safety/Judgement: Decreased awareness of safety;Decreased awareness of deficits Awareness: Emergent Problem Solving: Slow processing General Comments: pt with flat affect and minimal conversation        Exercises      General Comments        Pertinent Vitals/Pain Pain Assessment: Faces Faces Pain Scale: Hurts a little bit Pain Location: bottom from being in bed Pain Descriptors / Indicators:  Discomfort    Home Living                          Prior Function            PT Goals (current goals can now be found in the care plan section) Acute Rehab PT Goals Patient Stated Goal: get back to his dog PT Goal Formulation: With patient Time For Goal Achievement: 09/07/21 Potential to Achieve Goals: Fair Progress towards PT goals: Progressing toward goals    Frequency    Min 3X/week      PT Plan Current plan  remains appropriate    Co-evaluation              AM-PAC PT "6 Clicks" Mobility   Outcome Measure  Help needed turning from your back to your side while in a flat bed without using bedrails?: A Little Help needed moving from lying on your back to sitting on the side of a flat bed without using bedrails?: A Lot Help needed moving to and from a bed to a chair (including a wheelchair)?: A Lot Help needed standing up from a chair using your arms (e.g., wheelchair or bedside chair)?: A Lot Help needed to walk in hospital room?: Total Help needed climbing 3-5 steps with a railing? : Total 6 Click Score: 11    End of Session Equipment Utilized During Treatment: Other (comment) (Right prosthetic sleeve) Activity Tolerance: Other (comment) Patient left: in bed;with bed alarm set Nurse Communication: Mobility status PT Visit Diagnosis: Muscle weakness (generalized) (M62.81);Other abnormalities of gait and mobility (R26.89);Repeated falls (R29.6);Pain Pain - Right/Left: Left Pain - part of body: Knee     Time: 1620-1640 PT Time Calculation (min) (ACUTE ONLY): 20 min  Charges:  $Therapeutic Activity: 8-22 mins                     Brandon Melnick, SPT   Brandon Melnick 09/04/2021, 5:18 PM

## 2021-09-04 NOTE — Progress Notes (Addendum)
Subjective: I seen and evaluated Leslie [name] at bedside.  He expressed wanting to go home.  Patient states that he has not had a bowel movement yet today.  Patient states that he is not experiencing any left knee pain today.  Patient states he continues to experience odynphagia when he "eats or drinks too fast".  Objective:  Vital signs in last 24 hours: Vitals:   09/03/21 2334 09/04/21 0345 09/04/21 0814 09/04/21 1137  BP: 137/61 119/71 (!) 159/63 (!) 142/50  Pulse: 90 93 75 63  Resp: 17 18  18   Temp: 98.6 F (37 C) 98.6 F (37 C) 97.9 F (36.6 C) 97.6 F (36.4 C)  TempSrc: Oral Oral Oral Oral  SpO2: 97% 98% 99% 99%  Weight:  89 kg    Height:       Physical Exam Constitutional:      General: He is awake. He is not in acute distress. HENT:     Head: Normocephalic and atraumatic.  Eyes:     General: Lids are normal.  Cardiovascular:     Rate and Rhythm: Normal rate and regular rhythm.     Heart sounds: Normal heart sounds.  Pulmonary:     Effort: Pulmonary effort is normal.     Breath sounds: Normal breath sounds.  Abdominal:     General: Abdomen is protuberant.  Genitourinary:    Comments: Foley present Musculoskeletal:     Comments: Left knee- fluctuant fluid collection present on the medial side of the knee that has not improved from previous day. The fluid collection is tracking towards the upper medial side of the patellar bone. Warmer in touch compared to the contralateral knee. Overall, redness has improved.     Right Lower Extremity: Right leg is amputated below knee.     Left Lower Extremity: Left leg is amputated below knee.  Skin:    General: Skin is warm and dry.  Neurological:     General: No focal deficit present.     Mental Status: He is alert. Mental status is at baseline.  Psychiatric:        Behavior: Behavior is cooperative.     Comments: Patient expresses really wanted to go home. Patient disappointed about still having to be in the hospital.      Assessment/Plan:  Principal Problem:   Septic arthritis of knee ([place]) Active Problems:   S/P BKA (below knee amputation) bilateral ([facility])   Acute kidney injury ([place])   Altered mental status   S/P debridement   MSSA (methicillin susceptible Staphylococcus aureus) infection   Cellulitis of left lower extremity   Pressure injury of skin  Septic arthritis of the left knee POD 5 repeat I&D; Patient denies pain of the left knee.  Fluctuant fluid collection noted on the medial side of the knee.  This fluid collection has been present for the last several days and appears to not be improving.  Following post op I&D 5 days ago, patient did pull his Hemovac.  And the Hemovac was never replaced.  Some concern for unresolving fluid collection. Orthopedics was consulted Silvestre Gunner, [place]) and they are aware of the hematoma; Ortho states there is nothing that can be done at this time. Patient remains afebrile and WBC WNL. --IV cefazolin 2 g every 8 hours-6 weeks per ID --IV Dilaudid for pain  Upper GI bleed secondary to duodenal ulcer Patient's hemoglobin continues to trend down.  Current hemoglobin is 8.  Patient has not had a bowel movement  in the last 48 hours.  Unable to determine if melena persists.  -- Reconsulted IR; recommendations appreciated --Continue to monitor hemoglobin --Continue to monitor for further episodes of melena  Hypertension -- Amlodipine 10 mg daily --Losartan 100 mg daily --Toprol 100 mg daily --Hydralazine as needed (if BP >130/90)  Medication adjustment --Taper off clonazepam   Prior to Admission Living Arrangement: Anticipated Discharge Location: Barriers to Discharge: Dispo: Anticipated discharge in approximately 1-2 day(s).   Timothy Lasso, MD 09/04/2021, 11:43 AM Pager: [phone] After 5pm on weekdays and 1pm on weekends: On Call pager [phone]

## 2021-09-05 ENCOUNTER — Inpatient Hospital Stay (HOSPITAL_COMMUNITY): Payer: Medicare (Managed Care) | Admitting: Anesthesiology

## 2021-09-05 ENCOUNTER — Inpatient Hospital Stay (HOSPITAL_COMMUNITY): Payer: Medicare (Managed Care)

## 2021-09-05 ENCOUNTER — Encounter (HOSPITAL_COMMUNITY): Payer: Self-pay | Admitting: Orthopedic Surgery

## 2021-09-05 ENCOUNTER — Encounter (HOSPITAL_COMMUNITY)
Admission: EM | Disposition: A | Discharge status: Skilled Nursing Facility | Payer: Self-pay | Source: Home / Self Care | Attending: Internal Medicine

## 2021-09-05 DIAGNOSIS — D62 Acute posthemorrhagic anemia: Secondary | ICD-10-CM

## 2021-09-05 DIAGNOSIS — K2990 Gastroduodenitis, unspecified, without bleeding: Secondary | ICD-10-CM

## 2021-09-05 DIAGNOSIS — M00862 Arthritis due to other bacteria, left knee: Secondary | ICD-10-CM

## 2021-09-05 DIAGNOSIS — E46 Unspecified protein-calorie malnutrition: Secondary | ICD-10-CM

## 2021-09-05 DIAGNOSIS — K3182 Dieulafoy lesion (hemorrhagic) of stomach and duodenum: Secondary | ICD-10-CM

## 2021-09-05 DIAGNOSIS — K269 Duodenal ulcer, unspecified as acute or chronic, without hemorrhage or perforation: Secondary | ICD-10-CM

## 2021-09-05 DIAGNOSIS — K922 Gastrointestinal hemorrhage, unspecified: Secondary | ICD-10-CM

## 2021-09-05 DIAGNOSIS — K2970 Gastritis, unspecified, without bleeding: Secondary | ICD-10-CM

## 2021-09-05 HISTORY — PX: ESOPHAGOGASTRODUODENOSCOPY: SHX5428

## 2021-09-05 HISTORY — PX: SCLEROTHERAPY: SHX6841

## 2021-09-05 HISTORY — PX: HEMOSTASIS CLIP PLACEMENT: SHX6857

## 2021-09-05 HISTORY — PX: HOT HEMOSTASIS: SHX5433

## 2021-09-05 LAB — RENAL FUNCTION PANEL
Albumin: 1.6 g/dL — ABNORMAL LOW (ref 3.5–5.0)
Anion gap: 8 (ref 5–15)
BUN: 30 mg/dL — ABNORMAL HIGH (ref 8–23)
CO2: 25 mmol/L (ref 22–32)
Calcium: 8.3 mg/dL — ABNORMAL LOW (ref 8.9–10.3)
Chloride: 101 mmol/L (ref 98–111)
Creatinine, Ser: 1.28 mg/dL — ABNORMAL HIGH (ref 0.61–1.24)
GFR, Estimated: 58 mL/min — ABNORMAL LOW (ref >60)
Glucose, Bld: 97 mg/dL (ref 70–99)
Phosphorus: 3.4 mg/dL (ref 2.5–4.6)
Potassium: 3.6 mmol/L (ref 3.5–5.1)
Sodium: 134 mmol/L — ABNORMAL LOW (ref 135–145)

## 2021-09-05 LAB — CBC
HCT: 22 % — ABNORMAL LOW (ref 39.0–52.0)
Hemoglobin: 7.1 g/dL — ABNORMAL LOW (ref 13.0–17.0)
MCH: 28.6 pg (ref 26.0–34.0)
MCHC: 32.3 g/dL (ref 30.0–36.0)
MCV: 88.7 fL (ref 80.0–100.0)
Platelets: 368 10*3/uL (ref 150–400)
RBC: 2.48 MIL/uL — ABNORMAL LOW (ref 4.22–5.81)
RDW: 16.6 % — ABNORMAL HIGH (ref 11.5–15.5)
WBC: 6.2 10*3/uL (ref 4.0–10.5)
nRBC: 0 % (ref 0.0–0.2)

## 2021-09-05 LAB — GLUCOSE, CAPILLARY: Glucose-Capillary: 87 mg/dL (ref 70–99)

## 2021-09-05 LAB — PREPARE RBC (CROSSMATCH)

## 2021-09-05 SURGERY — EGD (ESOPHAGOGASTRODUODENOSCOPY)
Anesthesia: Monitor Anesthesia Care

## 2021-09-05 MED ORDER — BISACODYL 10 MG RE SUPP: 10.0000 mg | Freq: Every day | RECTAL | Status: DC | PRN

## 2021-09-05 MED ORDER — PROPOFOL 500 MG/50ML IV EMUL
INTRAVENOUS | Status: DC | PRN
  Administered 2021-09-05: 125 ug/kg/min via INTRAVENOUS

## 2021-09-05 MED ORDER — PROPOFOL 10 MG/ML IV BOLUS
INTRAVENOUS | Status: DC | PRN
  Administered 2021-09-05: 60 mg via INTRAVENOUS

## 2021-09-05 MED ORDER — SODIUM CHLORIDE 0.9 % IV SOLN: INTRAVENOUS | Status: DC

## 2021-09-05 MED ORDER — EPINEPHRINE 1 MG/10ML IJ SOSY
PREFILLED_SYRINGE | INTRAMUSCULAR | Status: AC
  Filled 2021-09-05: qty 10

## 2021-09-05 MED ORDER — HYDRALAZINE HCL 25 MG PO TABS
25.0000 mg | ORAL_TABLET | Freq: Once | ORAL | Status: DC | PRN
  Administered 2021-09-06 – 2021-09-07 (×2): 25 mg via ORAL
  Filled 2021-09-05 (×2): qty 1

## 2021-09-05 MED ORDER — IOHEXOL 350 MG/ML SOLN
100.0000 mL | Freq: Once | INTRAVENOUS | Status: AC | PRN
  Administered 2021-09-05: 100 mL via INTRAVENOUS

## 2021-09-05 MED ORDER — SODIUM CHLORIDE 0.9% IV SOLUTION: Freq: Once | INTRAVENOUS | Status: AC

## 2021-09-05 MED ORDER — SODIUM CHLORIDE (PF) 0.9 % IJ SOLN
PREFILLED_SYRINGE | INTRAMUSCULAR | Status: DC | PRN
  Administered 2021-09-05: 2.5 mL

## 2021-09-05 NOTE — Op Note (Signed)
[facility] Patient Name: Leslie [name] Procedure Date : 09/05/2021 MRN: [id number] Attending MD: Gerrit Heck , MD Date of Birth: 06-24-1946 CSN: [id number] Age: 75 Admit Type: Inpatient Procedure:                Upper GI endoscopy Indications:              Acute post hemorrhagic anemia, Melena, Exclusion of                            duodenal ulcer with hemorrhage Providers:                Gerrit Heck, MD, Doristine Johns, RN, Tyna Jaksch Technician Referring MD:              Medicines:                Monitored Anesthesia Care. Transfusing 2U pRBCs                            during the procedure. Complications:            No immediate complications. Estimated Blood Loss:     Estimated blood loss was minimal. Procedure:                Pre-Anesthesia Assessment:                           - Prior to the procedure, a History and Physical                            was performed, and patient medications and                            allergies were reviewed. The patient's tolerance of                            previous anesthesia was also reviewed. The risks                            and benefits of the procedure and the sedation                            options and risks were discussed with the patient.                            All questions were answered, and informed consent                            was obtained. Prior Anticoagulants: The patient has                            taken no previous anticoagulant or antiplatelet  agents. ASA Grade Assessment: III - A patient with                            severe systemic disease. After reviewing the risks                            and benefits, the patient was deemed in                            satisfactory condition to undergo the procedure.                           After obtaining informed consent, the endoscope was                             passed under direct vision. Throughout the                            procedure, the patient's blood pressure, pulse, and                            oxygen saturations were monitored continuously. The                            [id number]-H190 ([id number] Olympus endoscope was introduced                            through the mouth, and advanced to the second part                            of duodenum. The upper GI endoscopy was                            accomplished without difficulty. The patient                            tolerated the procedure well. Scope In: Scope Out: Findings:      Severe esophagitis with no bleeding was found in the middle and lower       third of the esophagus.      Mild inflammation characterized by congestion (edema) and erythema was       found in the gastric antrum. There was luminal deformity of the antrum       and pre-pyloric channel.      Active bleeding blood was found in the duodenal bulb. This area was       lavaged and source of bleeding localized to a vascular lesion in the       duodenal bulb, which was separate from the cratered ulcer. 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 Coagulation for       hemostasis using bipolar probe was performed with some continued       bleeding. Three hemostatic clips were successfully placed (MR       conditional) with cessation of bleeding. The area was then successfully  injected with 3 mL of a 1:10,000 solution of epinephrine for hemostasis       with appropriate mucosal blanching.      One non-bleeding cratered duodenal ulcer with no stigmata of bleeding       was found in the duodenal bulb. The lesion was 8 mm in largest       dimension. There was a small speck of blood that easily lavaged off. No       additional high grade stigmata of bleeding was seen at this location.       This ulcer was separate from the above bleeding site.      The second portion of the duodenum was  normal. Impression:               - Severe esophagitis with no bleeding.                           - Mild gastritis with luminal deformity of the                            distal stomach. The previously noted ulcers have                            since healed.                           - Active bleeding in the duodenal bulb. Appeance                            and clinical presentation seems most consistent                            with Dieulafoy lesion. This was successfully                            treated with a combination of bipolar cautery,                            hemostatic clips x3, then Epinephrine injection.                           - Non-bleeding duodenal ulcer with no stigmata of                            bleeding.                           - Normal second portion of the duodenum.                           - No specimens collected. Recommendation:           - Return patient to hospital ward for ongoing care.                           - Full liquid diet.                           -  Use Protonix 40 mg IV BID for 3 days then convert                            to Protonix 40 mg PO BID for 6-8 weeks to promote                            mucosal healing.                           - Continue serial CBC checks with additional blood                            products as needed. Procedure Code(s):        --- Professional ---                           [phone], Esophagogastroduodenoscopy, flexible,                            transoral; with control of bleeding, any method Diagnosis Code(s):        --- Professional ---                           K20.90, Esophagitis, unspecified without bleeding                           K29.70, Gastritis, unspecified, without bleeding                           K92.2, Gastrointestinal hemorrhage, unspecified                           K26.9, Duodenal ulcer, unspecified as acute or                            chronic, without hemorrhage or  perforation                           D62, Acute posthemorrhagic anemia                           K92.1, Melena (includes Hematochezia) CPT copyright 2019 [employer]. All rights reserved. The codes documented in this report are preliminary and upon coder review may  be revised to meet current compliance requirements. Gerrit Heck, MD 09/05/2021 5:10:30 PM Number of Addenda: 0

## 2021-09-05 NOTE — Transfer of Care (Signed)
Immediate Anesthesia Transfer of Care Note  Patient: RAY GERVASI  Procedure(s) Performed: ESOPHAGOGASTRODUODENOSCOPY (EGD) (Left) HOT HEMOSTASIS (ARGON PLASMA COAGULATION/BICAP) HEMOSTASIS CLIP PLACEMENT SCLEROTHERAPY  Patient Location: Endoscopy Unit  Anesthesia Type:MAC  Level of Consciousness: drowsy and patient cooperative  Airway & Oxygen Therapy: Patient Spontanous Breathing and Patient connected to face mask oxygen  Post-op Assessment: Report given to RN and Post -op Vital signs reviewed and stable  Post vital signs: Reviewed and stable  Last Vitals:  Vitals Value Taken Time  BP 154/68 09/05/21 1659  Temp 36.6 C 09/05/21 1659  Pulse 84 09/05/21 1701  Resp 26 09/05/21 1701  SpO2 91 % 09/05/21 1701  Vitals shown include unvalidated device data.  Last Pain:  Vitals:   09/05/21 1659  TempSrc: Temporal  PainSc:       Patients Stated Pain Goal: 0 (13/08/65 7846)  Complications: No notable events documented.

## 2021-09-05 NOTE — H&P (Signed)
Chief Complaint: Patient was seen in consultation today for  Chief Complaint  Patient presents with   Altered Mental Status    Referring Physician(s): Dr. Jeanice Lim  Supervising Physician: Mir, Sharen Heck  Patient Status: [name] - In-pt  History of Present Illness: Leslie [name] is a 75 y.o. male with a medical history significant for cellulitis, COPD, HTN, PAD (bilateral AKAs), substance/alcohol abuse, DM2. He was brought to the ED via EMS 08/18/21 for altered level of consciousness. Work up was pertinent for significant leukocytosis, acute renal failure and possible developing cellulitis to the left leg. Further work up revealed a septic left knee and he was taken to the OR 08/19/21 for an I&D with ortho. He was taken back to the OR 08/30/21 due to persistent left septic knee and he had a repeat debridement.    His hospital course has been complicated by the development of hematemesis and melena and he underwent EGD with GI on 08/25/21. The EGD revealed severe esophagitis and a large duodenal ulcer with adherent clot and active oozing. Per procedure notes, "definitive hemostasis was not attempted out of concern of creating catastrophic bleeding." Recommendations were made for protonix and sucralfate. Possible embolization was discussed with IR but given the patient's renal insufficiency the decision was made to attempt medical management as long as the patient remained hemodynamically stable.    Leslie [name] continued to experience melena with drops in hemoglobin levels and he received PRBCs 08/31/21. Interventional Radiology was consulted 08/31/21 for a possible mesenteric arteriogram with embolization but by the time he was evaluated the hemoglobin level had stabilized and there were no obvious signs of bleeding.    Leslie [name] continues to demonstrate no outward signs of bleeding but his hemoglobin level is steadily declining. IR was re-consulted for possible mesenteric arteriogram with  intervention. This case has been reviewed and procedure approved by Dr. Dwaine Gale.   Past Medical History:  Diagnosis Date   Anxiety    Panic attack - 10  years aago- ? panic    Asthma    Cellulitis of left foot 2016   Healed and recurred 01/2016   Cellulitis of right foot 06/30/2012   Chronic lower back pain 07/01/2012   COPD (chronic obstructive pulmonary disease) ([place])    Uses nebulizers at home. Former smoker, current chewing tobacco. 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   GERD (gastroesophageal reflux disease)    Gouty arthritis 07/01/2012   Not attacks in ~5 yrs (03/2016)   Hepatitis 1967   "in jail when I got it; dr said it was pretty bad; quaranteened X 30d"   Hyperlipidemia    Hypertension    Lung nodule, solitary 07/2015   Due for 1-yr recheck in 07/2017   Peripheral arterial disease ([place])    critical limb ischemia   Peripheral neuropathy 07/01/2012   Seasonal allergies    Skin growth 07/01/2012   right side of nares; "been on there 20 years"   Substance abuse ([place])    Type II diabetes mellitus ([place]) 2002   Diagnosed in 2002    Past Surgical History:  Procedure Laterality Date   AMPUTATION Left 03/28/2016   Procedure: AMPUTATION left foot 4th and 5 th ray;  Surgeon: Newt Minion, MD;  Location: [place];  Service: Orthopedics;  Laterality: Left;   AMPUTATION Left 04/11/2016   Procedure: LEFT BELOW KNEE AMPUTATION;  Surgeon: Newt Minion, MD;  Location: [place];  Service: Orthopedics;  Laterality: Left;   AMPUTATION Right 01/22/2018   Procedure: RIGHT FOOT  3RD RAY AMPUTATION;  Surgeon: Newt Minion, MD;  Location: [place];  Service: Orthopedics;  Laterality: Right;   AMPUTATION Right 02/19/2018   Procedure: RIGHT TRANSMETATARSAL AMPUTATION;  Surgeon: Newt Minion, MD;  Location: [place];  Service: Orthopedics;  Laterality: Right;   AMPUTATION Right 03/28/2018   Procedure: RIGHT BELOW KNEE AMPUTATION;  Surgeon: Newt Minion, MD;  Location: [place];  Service: Orthopedics;  Laterality: Right;   BIOPSY   08/25/2021   Procedure: BIOPSY;  Surgeon: Daryel November, MD;  Location: [facility] ENDOSCOPY;  Service: Gastroenterology;;   ESOPHAGOGASTRODUODENOSCOPY (EGD) WITH PROPOFOL N/A 08/25/2021   Procedure: ESOPHAGOGASTRODUODENOSCOPY (EGD) WITH PROPOFOL;  Surgeon: Daryel November, MD;  Location: [place];  Service: Gastroenterology;  Laterality: N/A;   HOT HEMOSTASIS  08/25/2021   Procedure: HOT HEMOSTASIS (ARGON PLASMA COAGULATION/BICAP);  Surgeon: Daryel November, MD;  Location: [facility]OSCOPY;  Service: Gastroenterology;;   I & D EXTREMITY Left 08/19/2021   Procedure: IRRIGATION AND DEBRIDEMENT LEFT KNEE;  Surgeon: Paralee Cancel, MD;  Location: [place];  Service: Orthopedics;  Laterality: Left;   I & D EXTREMITY Left 08/29/2021   Procedure: IRRIGATION AND DEBRIDEMENT Left Knee;  Surgeon: Paralee Cancel, MD;  Location: [place];  Service: Orthopedics;  Laterality: Left;   LEG AMPUTATION BELOW KNEE Left 04/11/2016   LOWER EXTREMITY ANGIOGRAM N/A 04/18/2013   Procedure: LOWER EXTREMITY ANGIOGRAM;  Surgeon: Sherren Mocha, MD;  Location: [facility] CATH LAB;  Service: Cardiovascular;  Laterality: N/A;   LOWER EXTREMITY ANGIOGRAM  08/13/2015   bilateral iliac    PERIPHERAL VASCULAR CATHETERIZATION Bilateral 08/13/2015   Procedure: Lower Extremity Angiography;  Surgeon: Lorretta Harp, MD;  Location: [place] CV LAB;  Service: Cardiovascular;  Laterality: Bilateral;   PERIPHERAL VASCULAR CATHETERIZATION N/A 08/13/2015   Procedure: Abdominal Aortogram;  Surgeon: Lorretta Harp, MD;  Location: [place] CV LAB;  Service: Cardiovascular;  Laterality: N/A;   SCLEROTHERAPY  08/25/2021   Procedure: SCLEROTHERAPY;  Surgeon: Daryel November, MD;  Location: [facility] ENDOSCOPY;  Service: Gastroenterology;;   TOE AMPUTATION Right 01/22/2018   3RD TOE RIGHT FOOT    Allergies: Atorvastatin, Statins, and Flexeril [cyclobenzaprine]  Medications: Prior to Admission medications   Medication Sig Start Date End Date  Taking? Authorizing Provider  acetaminophen (TYLENOL) 325 MG tablet Take 2 tablets (650 mg total) by mouth every 6 (six) hours as needed for mild pain (or Fever >/= 101). Patient taking differently: Take 650 mg by mouth every 8 (eight) hours as needed for mild pain (or Fever >/= 101). 03/30/18  Yes Marcelyn Bruins, MD  albuterol (PROVENTIL HFA;VENTOLIN HFA) 108 (90 Base) MCG/ACT inhaler Inhale 2 puffs into the lungs 4 (four) times daily as needed for wheezing or shortness of breath.    Yes [provider]  albuterol (PROVENTIL) (2.5 MG/3ML) 0.083% nebulizer solution Take 2.5 mg by nebulization every 6 (six) hours as needed for wheezing or shortness of breath.   Yes [provider]  allopurinol (ZYLOPRIM) 100 MG tablet Take 100 mg by mouth at bedtime. For gout   Yes [provider]  amLODipine (NORVASC) 10 MG tablet Take 1 tablet (10 mg total) by mouth daily. 08/09/12  Yes Funches, Adriana Mccallum, MD  aspirin 81 MG chewable tablet Chew 81 mg by mouth daily.   Yes [provider]  calcium carbonate (TUMS EX) 750 MG chewable tablet Chew 1 tablet by mouth 2 (two) times daily as needed for heartburn.   Yes [provider]  carbamazepine (TEGRETOL  XR) 200 MG 12 hr tablet Take 200 mg by mouth 2 (two) times daily.   Yes [provider]  Cholecalciferol (VITAMIN D-3) 1000 units CAPS Take 1,000 Units by mouth daily.   Yes [provider]  Evolocumab with Infusor ([place]) 420 MG/3.5ML SOCT Inject 420 mg into the skin every 30 (thirty) days.   Yes [provider]  famotidine (PEPCID) 20 MG tablet Take 20 mg by mouth at bedtime.   Yes [provider]  ferrous sulfate 325 (65 FE) MG tablet Take 325 mg by mouth every other day.   Yes [provider]  insulin glargine (LANTUS) 100 UNIT/ML injection Inject 75 Units into the skin daily.   Yes [provider]  LORazepam (ATIVAN) 0.5 MG tablet Take 0.25 mg by  mouth daily as needed for anxiety (x2 months). 07/30/21 10/22/21 Yes [provider]  losartan-hydrochlorothiazide (HYZAAR) 100-25 MG tablet Take 1 tablet by mouth daily.   Yes [provider]  Melatonin 10 MG TABS Take 10 mg by mouth at bedtime.   Yes [provider]  metFORMIN (GLUCOPHAGE) 850 MG tablet Take 850 mg by mouth 2 (two) times daily with a meal.   Yes [provider]  metoprolol (TOPROL-XL) 200 MG 24 hr tablet Take 200 mg by mouth daily. Take with or immediately following a meal.   Yes [provider]  Multiple Vitamins-Minerals (CERTAVITE SENIOR/ANTIOXIDANT) TABS Take 1 tablet by mouth daily.   Yes [provider]  nitroGLYCERIN (NITROSTAT) 0.4 MG SL tablet Place 0.4 mg under the tongue every 5 (five) minutes x 3 doses as needed for chest pain.  11/12/11  Yes Funches, Josalyn, MD  polyethylene glycol (MIRALAX / GLYCOLAX) 17 g packet Take 17 g by mouth every other day.   Yes [provider]  senna-docusate (SENOKOT-S) 8.6-50 MG tablet Take 2 tablets by mouth daily.   Yes [provider]  Skin Protectants, Misc. (MINERIN) CREA Apply 1 application topically 2 (two) times daily. For dryness   Yes [provider]  traZODone (DESYREL) 50 MG tablet Take 50 mg by mouth at bedtime.   Yes [provider]  vitamin B-12 (CYANOCOBALAMIN) 1000 MCG tablet Take 1,000 mcg by mouth daily.   Yes [provider]  bisacodyl (DULCOLAX) 10 MG suppository Place 10 mg rectally once as needed (for constipation not relieved by Milk of Magnesia). Patient not taking: Reported on 08/22/2021    [provider]  carbamazepine (TEGRETOL) 200 MG tablet Take 200-300 mg by mouth See admin instructions. Take 1 tablet (200 mg) by mouth every morning and 1 1/2 tablet (300 mg) every evening Patient not taking: No sig reported    [provider]  clonazePAM (KLONOPIN) 1 MG tablet Take 1 mg by mouth at bedtime.   Patient not taking: Reported on 08/22/2021    [provider]  DULoxetine (CYMBALTA) 60 MG capsule Take 60 mg by mouth daily. Patient not taking: Reported on 08/22/2021    [provider]  furosemide (LASIX) 20 MG tablet Take 20 mg by mouth daily. Patient not taking: Reported on 08/22/2021    [provider]  hydrALAZINE (APRESOLINE) 50 MG tablet Take 50 mg by mouth 3 (three) times daily. Patient not taking: Reported on 08/22/2021    [provider]  insulin glargine (LANTUS) 100 UNIT/ML injection Inject 20 Units into the skin daily.  Patient not taking: No sig reported    [provider]  losartan (COZAAR) 100 MG  tablet Take 100 mg by mouth daily. Patient not taking: No sig reported    [provider]  metFORMIN (GLUCOPHAGE) 1000 MG tablet Take 1 tablet (1,000 mg total) by mouth 2 (two) times daily with a meal. Patient not taking: No sig reported 08/16/15   Eileen Stanford, PA-C  mirtazapine (REMERON) 7.5 MG tablet Take 7.5 mg by mouth at bedtime. Patient not taking: Reported on 08/22/2021    [provider]  pentoxifylline (TRENTAL) 400 MG CR tablet Take 1 tablet (400 mg total) by mouth 3 (three) times daily with meals. Patient not taking: Reported on 08/22/2021 02/04/18   Newt Minion, MD  pregabalin (LYRICA) 150 MG capsule Take 150 mg by mouth 3 (three) times daily.  Patient not taking: Reported on 08/22/2021    [provider]  ranitidine (ZANTAC) 300 MG tablet Take 300 mg by mouth at bedtime. Patient not taking: Reported on 08/22/2021    [provider]  Sodium Phosphates (FLEET ENEMA RE) Place 1 each rectally daily as needed (constipation not relieved by Bisacodyl suppository). Patient not taking: Reported on 08/22/2021    [provider]     Family History  Problem Relation Age of Onset   Diabetes Sister    Hypertension Mother     Social History   Socioeconomic History   Marital  status: Widowed    Spouse name: Not on file   Number of children: Not on file   Years of education: Not on file   Highest education level: Not on file  Occupational History   Occupation: disabled  Tobacco Use   Smoking status: Former    Packs/day: 2.00    Years: 40.00    Pack years: 80.00    Types: Cigarettes    Quit date: 01/13/2002    Years since quitting: 19.6   Smokeless tobacco: Current    Types: Chew   Tobacco comments:    chews a little tobacco  Vaping Use   Vaping Use: Never used  Substance and Sexual Activity   Alcohol use: Not Currently    Comment: 03/03/18: "Used to drink alot. 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   Lives in assisted living. Performs ADLs and IADLs, including cooking and managing his medications. Friend drives him to the store. Uses rollator walker at home and motorized scooter in store.   [facility] patient   Widowed   5-8 years education   Likes to fish   Pets:  4 cockatiels and 2 lovebirds   Social Determinants of Radio broadcast assistant Strain: Not on file  Food Insecurity: Not on file  Transportation Needs: Not on file  Physical Activity: Not on file  Stress: Not on file  Social Connections: Not on file    Review of Systems: A 12 point ROS discussed and pertinent positives are indicated in the HPI above.  All other systems are negative.  Review of Systems  Constitutional:  Positive for fatigue.  Respiratory:  Negative for cough and shortness of breath.   Gastrointestinal:  Negative for abdominal pain, diarrhea, nausea and vomiting.  Neurological:  Negative for dizziness and headaches.   Vital Signs: BP (!) 165/77 (BP Location: Left Arm)  Pulse 89   Temp 98.2 F (36.8 C) (Oral)    Resp 19   Ht 5' 6"  (1.676 m)   Wt 198 lb 13.7 oz (90.2 kg)   SpO2 98%   BMI 32.10 kg/m   Physical Exam Constitutional:      General: He is not in acute distress.    Appearance: He is ill-appearing.  HENT:     Mouth/Throat:     Mouth: Mucous membranes are moist.     Pharynx: Oropharynx is clear.  Pulmonary:     Effort: Pulmonary effort is normal.  Abdominal:     General: There is no distension.  Musculoskeletal:     Comments: Bilateral BKA. Left stump wrapped in ace bandage. Patient complains of pain in this leg.  Skin:    General: Skin is warm and dry.  Neurological:     Mental Status: He is alert and oriented to person, place, and time.    Imaging: CT ABDOMEN PELVIS WO CONTRAST  Result Date: 08/23/2021 CLINICAL DATA:  Abdominal pain. EXAM: CT ABDOMEN AND PELVIS WITHOUT CONTRAST TECHNIQUE: Multidetector CT imaging of the abdomen and pelvis was performed following the standard protocol without IV contrast. COMPARISON:  Abdominal radiograph dated 08/22/2021. FINDINGS: Evaluation of this exam is limited in the absence of intravenous contrast. Lower chest: The visualized lung bases are clear. Several pleural based calcifications noted which may be sequela of prior asbestos exposure. Three vessel coronary vascular calcification. A 12 mm nodular density at the left lung base (60/5), possibly with focus of scarring. This is however new since the study of 01/03/2019. Follow-up recommended. No intra-abdominal free air or free fluid. Hepatobiliary: Small calcified liver granuloma. The liver is otherwise unremarkable. No intrahepatic biliary dilatation. The gallbladder is unremarkable. Pancreas: Unremarkable. No pancreatic ductal dilatation or surrounding inflammatory changes. Spleen: Normal in size without focal abnormality. Adrenals/Urinary Tract: The adrenal glands are unremarkable. There is no hydronephrosis or nephrolithiasis on either side. Renal vascular calcifications. Mild fullness of  the renal collecting systems and ureters may be related to reflux. The urinary bladder is distended. Stomach/Bowel: Feeding tube with tip in the region of the proximal duodenum. There is no bowel obstruction or active inflammation. Several small scattered colonic diverticula without active inflammatory changes. The appendix is normal. Vascular/Lymphatic: Advanced aortoiliac atherosclerotic disease. The IVC is unremarkable. No portal venous gas. There is no adenopathy. Reproductive: Enlarged prostate gland measuring 6.6 cm in transverse axial diameter. The seminal vesicles are symmetric. Other: None Musculoskeletal: Degenerative changes of the spine. No acute osseous pathology. IMPRESSION: 1. No acute intra-abdominal or pelvic pathology. No bowel obstruction. Normal appendix. 2. Colonic diverticulosis. 3. Enlarged prostate gland. 4. Distended urinary bladder with probable mild reflux. 5. Aortic Atherosclerosis (ICD10-I70.0). Electronically Signed   By: Anner Crete M.D.   On: 08/23/2021 00:19   DG Abd 1 View  Result Date: 08/22/2021 CLINICAL DATA:  Feeding tube adjustment EXAM: ABDOMEN - 1 VIEW COMPARISON:  Abdominal x-ray 08/21/2021 FINDINGS: Enteric tube tip is in the proximal duodenum with injected contrast visualized in the duodenum. IMPRESSION: As above. Electronically Signed   By: Ofilia Neas M.D.   On: 08/22/2021 08:57   CT HEAD WO CONTRAST (5MM)  Result Date: 08/18/2021 CLINICAL DATA:  Delirium, altered mental status. EXAM: CT HEAD WITHOUT CONTRAST TECHNIQUE: Contiguous axial images were obtained from the base of the skull through the vertex without intravenous contrast. COMPARISON:  CT head dated 12/13/2020. FINDINGS: Brain: No evidence of acute infarction, hemorrhage, hydrocephalus, extra-axial collection or mass  lesion/mass effect. There is mild cerebral volume loss with associated ex vacuo dilatation. Periventricular white matter hypoattenuation likely represents chronic small vessel  ischemic disease. Vascular: There are vascular calcifications in the carotid siphons. Skull: Normal. Negative for fracture or focal lesion. Sinuses/Orbits: There is a small left mastoid effusion. There is mild right ethmoid sinus disease. Other: None. IMPRESSION: No acute intracranial process. Electronically Signed   By: Zerita Boers M.D.   On: 08/18/2021 18:12   US Abdomen Complete  Result Date: 08/19/2021 CLINICAL DATA:  Abdominal pain EXAM: ABDOMEN ULTRASOUND COMPLETE COMPARISON:  None. FINDINGS: Gallbladder: No gallstones or wall thickening visualized. Minimal sludge. No sonographic Murphy sign noted by sonographer. Common bile duct: Diameter: 6 mm Liver: No focal lesion identified. Within normal limits in parenchymal echogenicity. Portal vein is patent on color Doppler imaging with normal direction of blood flow towards the liver. IVC: No abnormality visualized. Pancreas: Not visualized due to overlying bowel gas. Spleen: Size and appearance within normal limits. Adjacent splenule. Right Kidney: Length: 9.4 cm. Echogenicity within normal limits. No mass or hydronephrosis visualized. Left Kidney: Length: 11.1 cm. Echogenicity within normal limits. No mass or hydronephrosis visualized. Abdominal aorta: No aneurysm visualized. Other findings: None. IMPRESSION: 1. Minimal sludge within the gallbladder. No evidence of cholecystitis. 2. Otherwise unremarkable ultrasound of the abdomen. Electronically Signed   By: Merilyn Baba M.D.   On: 08/19/2021 00:26   MR TIBIA FIBULA LEFT WO CONTRAST  Result Date: 08/29/2021 CLINICAL DATA:  Left knee pain. Prior below the knee amputations. History of gout and arthritis. Recent septic knee joint on the left, status post drainage, excisional synovial debridement coma and lavage on 08/20/2021. EXAM: MRI OF LOWER LEFT EXTREMITY WITHOUT CONTRAST TECHNIQUE: Multiplanar, multisequence MR imaging of the left tibia/fibula was performed. No intravenous contrast was administered.  COMPARISON:  MRI femur from 08/28/2021 FINDINGS: The patient was experiencing confusion during imaging and was unable to remain stationary, resulting in extensive motion artifact. We obtain the best exam we could under the circumstances. Reduced diagnostic sensitivity and specificity. Bones/Joint/Cartilage No substantial marrow edema in the left distal femur, patella, remaining tibia, or remaining fibula to indicate active osteomyelitis. There is a moderate left knee joint effusion. Ligaments N/A Muscles and Tendons Regional muscular atrophy Soft tissues Substantial medial fluid collection apparently contiguous with a Baker's cyst extending along the medial knee and stump, measuring about 11.5 by 2.8 by 17.9 cm (volume = 300 cm^3). It is possible that this is joint fluid extending into a Baker's cyst and then extravasated into the soft tissues, although this collection has a somewhat lower T2 signal than the fluid within the joint and accordingly may represent a separate process. The T1 signal characteristics are similar to the fluid in the joint and likely mildly complex. IMPRESSION: 1. No findings of active osteomyelitis. 2. Proximally 300 cc fluid collection consisting of a large Baker's cyst and apparent contiguous extension of this fluid along the medial knee and stump, extending down to the distal bony margin of the tibia. This collection is probably mildly complex. Given that it seems contiguous with the Baker's cyst, possibility of some connectivity to the knee joint effusion is raised although the collection does have slightly lower T2 signal than the fluid in the knee joint. Clearly in this circumstance in with the patient's history, infection involving this collection is not readily excluded, although I do not see gas in the soft tissues. Electronically Signed   By: Van Clines M.D.   On: 08/29/2021 07:55  MR FEMUR LEFT WO CONTRAST  Result Date: 08/28/2021 CLINICAL DATA:  Osteomyelitis and  soft tissue infection, incision and drainage on 08/19/2021 EXAM: MR OF THE LEFT FEMUR WITHOUT CONTRAST TECHNIQUE: Multiplanar, multisequence MR imaging of the left femur was performed. No intravenous contrast was administered. COMPARISON:  CT scan 08/23/2021 FINDINGS: Despite efforts by the technologist and patient, motion artifact is present on today's exam and could not be eliminated. This reduces exam sensitivity and specificity. Bones/Joint/Cartilage The patient is a below the knee amputation. The distal margin of the stump was not included on today's femur MRI. No significant abnormal marrow edema in the femur to indicate osteomyelitis. Moderate left knee joint effusion. Upper normal amount of fluid in the left hip joint. Ligaments N/A Muscles and Tendons In the distal thigh region, there is low-grade edema tracking around the margins of the distal femur as well as along fascia planes between the anterior and medial compartment and anterior and posterior compartment, with low-level adjacent muscular edema. Suspected large Baker's cyst. Soft tissues Possible fluid in the soft tissues of the proximal calf tracking down medially. This area is very poorly seen due to signal loss. Mild infiltrative edema medially along the distal thigh. Other: There a catheter in the urinary bladder. IMPRESSION: 1. No osteomyelitis of the femur is identified. 2. Left knee effusion with Baker's cyst. Signal is poor in the vicinity of the knee. 3. There is probably some fluid tracking in the medial upper calf near the Baker's cyst within the soft tissues. Signal is very poor below the level of the knee and the distal margin of the stump was not included. 4. Mild edema in the soft tissues surrounding the periosteal margin of the distal femur and tracking along adjacent fascia planes including the fascia planes between the anterior posterior compartment and anterior and medial compartment. Low-grade fasciitis is not completely excluded.  Electronically Signed   By: Van Clines M.D.   On: 08/28/2021 13:41   DG Chest Portable 1 View  Result Date: 08/18/2021 CLINICAL DATA:  Altered mental status.  Hypoglycemia. EXAM: PORTABLE CHEST 1 VIEW COMPARISON:  Chest radiograph 03/27/2018.  Chest CT 01/03/2019 FINDINGS: Upper normal heart size, stable from prior exam. Unchanged mediastinal contours. Aortic atherosclerosis and tortuosity. Scattered calcified pleural plaques, better demonstrated on prior CT. No acute airspace disease. No pulmonary edema, pleural effusion, or pneumothorax. No acute osseous abnormalities are seen. IMPRESSION: No acute findings. Aortic Atherosclerosis (ICD10-I70.0). Electronically Signed   By: Keith Rake M.D.   On: 08/18/2021 19:15   DG Knee Left Port  Result Date: 08/18/2021 CLINICAL DATA:  Sepsis, possible infection. EXAM: PORTABLE LEFT KNEE - 1-2 VIEW COMPARISON:  Included portions from femur radiograph 08/12/2021 FINDINGS: Below the knee amputation. The resection margins are smooth. No erosion or periosteal reaction. The knee joint is intact. Minimal degenerative change. No erosion or bony destruction. Advanced vascular calcifications. Mild generalized soft tissue edema. No soft tissue air. Similar density adjacent to the distal femur in the lateral soft tissues, well-defined margins and of doubtful clinical significance. IMPRESSION: 1. Below the knee amputation. No radiographic findings of osteomyelitis. 2. Mild soft tissue edema. Electronically Signed   By: Keith Rake M.D.   On: 08/18/2021 19:34   DG Abd Portable 1V  Result Date: 08/21/2021 CLINICAL DATA:  Feeding tube placement EXAM: PORTABLE ABDOMEN - 1 VIEW COMPARISON:  None. FINDINGS: Heart is enlarged. Atherosclerotic calcification of the aortic arch. Visualized lungs are clear. Visualized upper abdomen is unremarkable. Distal tip of the  feeding tube projecting over the gastric fundus. IMPRESSION: Feeding tube with distal tip projecting  over the gastric fundus, it could be advanced 5-8 cm. Electronically Signed   By: Keane Police D.O.   On: 08/21/2021 17:35   ECHOCARDIOGRAM COMPLETE  Result Date: 08/20/2021    ECHOCARDIOGRAM REPORT   Patient Name:   Leslie [name] Date of Exam: 08/20/2021 Medical Rec #:  [id number]       Height:       66.0 in Accession #:    [id number]      Weight:       202.4 lb Date of Birth:  1946/06/09       BSA:          2.010 m Patient Age:    48 years        BP:           127/64 mmHg Patient Gender: M               HR:           76 bpm. Exam Location:  Inpatient Procedure: 2D Echo, Cardiac Doppler and Color Doppler Indications:    Abnl Ekg  History:        Patient has no prior history of Echocardiogram examinations.                 Risk Factors:Hypertension and Diabetes.  Sonographer:    Helmut Muster Referring Phys: [place]  1. Left ventricular ejection fraction, by estimation, is 60 to 65%. The left ventricle has normal function. The left ventricle has no regional wall motion abnormalities. There is mild concentric left ventricular hypertrophy. Left ventricular diastolic function could not be evaluated.  2. Right ventricular systolic function is normal. The right ventricular size is normal. Tricuspid regurgitation signal is inadequate for assessing PA pressure.  3. The mitral valve is degenerative. Trivial mitral valve regurgitation. Mild mitral stenosis. Moderate to severe mitral annular calcification.  4. The aortic valve is grossly normal. Aortic valve regurgitation is not visualized. No aortic stenosis is present.  5. The inferior vena cava is normal in size with greater than 50% respiratory variability, suggesting right atrial pressure of 3 mmHg. FINDINGS  Left Ventricle: Left ventricular ejection fraction, by estimation, is 60 to 65%. The left ventricle has normal function. The left ventricle has no regional wall motion abnormalities. The left ventricular internal cavity size was normal  in size. There is  mild concentric left ventricular hypertrophy. Left ventricular diastolic function could not be evaluated due to mitral annular calcification (moderate or greater). Left ventricular diastolic function could not be evaluated. Right Ventricle: The right ventricular size is normal. No increase in right ventricular wall thickness. Right ventricular systolic function is normal. Tricuspid regurgitation signal is inadequate for assessing PA pressure. Left Atrium: Left atrial size was normal in size. Right Atrium: Right atrial size was normal in size. Pericardium: Trivial pericardial effusion is present. Mitral Valve: The mitral valve is degenerative in appearance. Moderate to severe mitral annular calcification. Trivial mitral valve regurgitation. Mild mitral valve stenosis. MV peak gradient, 10.5 mmHg. The mean mitral valve gradient is 5.0 mmHg. Tricuspid Valve: The tricuspid valve is grossly normal. Tricuspid valve regurgitation is not demonstrated. No evidence of tricuspid stenosis. Aortic Valve: The aortic valve is grossly normal. Aortic valve regurgitation is not visualized. No aortic stenosis is present. Pulmonic Valve: The pulmonic valve was grossly normal. Pulmonic valve regurgitation is not visualized. No evidence of pulmonic stenosis. Aorta:  The aortic root and ascending aorta are structurally normal, with no evidence of dilitation. Venous: The right lower pulmonary vein is normal. The inferior vena cava is normal in size with greater than 50% respiratory variability, suggesting right atrial pressure of 3 mmHg. IAS/Shunts: The atrial septum is grossly normal.  LEFT VENTRICLE PLAX 2D LVIDd:         3.90 cm      Diastology LVIDs:         2.70 cm      LV e' medial:    6.09 cm/s LV PW:         1.40 cm      LV E/e' medial:  26.1 LV IVS:        1.40 cm      LV e' lateral:   10.60 cm/s LVOT diam:     2.00 cm      LV E/e' lateral: 15.0 LV SV:         89 LV SV Index:   44 LVOT Area:     3.14 cm  LV  Volumes (MOD) LV vol d, MOD A2C: 105.0 ml LV vol d, MOD A4C: 76.5 ml LV vol s, MOD A2C: 40.9 ml LV vol s, MOD A4C: 33.6 ml LV SV MOD A2C:     64.1 ml LV SV MOD A4C:     76.5 ml LV SV MOD BP:      52.8 ml RIGHT VENTRICLE             IVC RV S prime:     13.70 cm/s  IVC diam: 1.90 cm TAPSE (M-mode): 2.5 cm LEFT ATRIUM             Index        RIGHT ATRIUM           Index LA diam:        3.70 cm 1.84 cm/m   RA Area:     15.90 cm LA Vol (A2C):   59.6 ml 29.65 ml/m  RA Volume:   33.40 ml  16.62 ml/m LA Vol (A4C):   63.7 ml 31.69 ml/m LA Biplane Vol: 64.6 ml 32.14 ml/m  AORTIC VALVE LVOT Vmax:   123.00 cm/s LVOT Vmean:  83.000 cm/s LVOT VTI:    0.282 m  AORTA Ao Root diam: 3.80 cm Ao Asc diam:  3.90 cm MITRAL VALVE MV Area (PHT): 2.76 cm     SHUNTS MV Area VTI:   1.77 cm     Systemic VTI:  0.28 m MV Peak grad:  10.5 mmHg    Systemic Diam: 2.00 cm MV Mean grad:  5.0 mmHg MV Vmax:       1.62 m/s MV Vmean:      100.0 cm/s MV Decel Time: 275 msec MV E velocity: 159.00 cm/s MV A velocity: 134.00 cm/s MV E/A ratio:  1.19 Eleonore Chiquito MD Electronically signed by Eleonore Chiquito MD Signature Date/Time: 08/20/2021/12:34:20 PM    Final    CT EXTREMITY LOWER LEFT WO CONTRAST  Result Date: 08/23/2021 CLINICAL DATA:  Infection suspected, bilateral below-knee amputation. Is EXAM: CT OF THE LOWER LEFT EXTREMITY WITHOUT CONTRAST TECHNIQUE: Multidetector CT imaging of the lower left extremity was performed according to the standard protocol. COMPARISON:  None. FINDINGS: Bones/Joint/Cartilage Status post bilateral below-knee amputation. No acute osseous abnormality. Ligaments Suboptimally assessed by CT. Muscles and Tendons Mild muscle atrophy.  No intramuscular hematoma or fluid collection. Soft tissues There is mild skin thickening and subcutaneous soft tissue  edema on the lateral aspect of the proximal femur. There is also mild skin thickening and subcutaneous soft tissue edema about the amputation site. There is trace amount  of fluid in the popliteal fossa without evidence of drainable fluid collection or abscess. Prominent vascular calcifications. IMPRESSION: 1.  No evidence of drainable fluid collection or abscess.2. 2. Mild skin thickening and subcutaneous soft tissue edema about the lateral aspect of the thigh as well as about the amputation site. Electronically Signed   By: Keane Police D.O.   On: 08/23/2021 07:46   DG FEMUR MIN 2 VIEWS LEFT  Result Date: 08/13/2021 CLINICAL DATA:  Pain EXAM: LEFT FEMUR 2 VIEWS COMPARISON:  None. FINDINGS: No acute fracture or dislocation. Mild degenerative changes of the left hip joint. Extensive vascular calcifications. Calcific density in the posterior soft tissues of the lower thigh measuring up to 1.6 cm. IMPRESSION: No acute osseous abnormality. Calcific density in the posterior soft tissues of the lower thigh, possibly a foreign body or heterotopic ossification. Electronically Signed   By: Yetta Glassman M.D.   On: 08/13/2021 13:28   [place] EKG SITE RITE  Result Date: 08/26/2021 If Site Rite image not attached, placement could not be confirmed due to current cardiac rhythm.  CT ANGIO GI BLEED  Result Date: 09/05/2021 CLINICAL DATA:  75 year old male with history of GI bleeding EXAM: CTA ABDOMEN AND PELVIS WITHOUT AND WITH CONTRAST TECHNIQUE: Multidetector CT imaging of the abdomen and pelvis was performed using the standard protocol during bolus administration of intravenous contrast. Multiplanar reconstructed images and MIPs were obtained and reviewed to evaluate the vascular anatomy. CONTRAST:  160m OMNIPAQUE IOHEXOL 350 MG/ML SOLN COMPARISON:  08/23/2021 FINDINGS: VASCULAR Aorta: Moderate atherosclerotic changes of the distal thoracic aorta and the abdominal aorta. No ulcerated plaque, dissection, aneurysm, periaortic fluid. Celiac: Moderate atherosclerotic changes of the celiac artery without evidence of high-grade stenosis at the origin. There is evidence of a developing  stenosis of the common hepatic artery secondary to soft plaque. SMA: Atherosclerotic changes at the origin of the SMA without evidence of high-grade stenosis. Branches are patent. Renals: - Right: The main right renal artery demonstrates high-grade stenosis at the origin secondary to mixed calcified and soft plaque. There is a secondary an accessory right renal artery in the lower abdominal aorta to the lower pole segment. - Left: Single left renal artery without evidence of high-grade stenosis at the origin secondary to mixed calcified and soft plaque. IMA: Inferior mesenteric artery is patent. Right lower extremity: Moderate to advanced calcified atherosclerotic plaque of the common iliac artery, hypogastric artery origin, external iliac artery. Estimated 50% narrowing in the CIA, high-grade stenosis at the origin of the hypogastric artery, and no significant stenosis in the EIA. Moderate atherosclerotic changes of the common femoral artery, predominantly distally at the bifurcation. Proximal profunda femoris and proximal SFA patent with atherosclerotic changes. Left lower extremity: Moderate to advanced atherosclerotic changes of the left common iliac artery, hypogastric artery, external iliac artery. No high-grade stenosis of the CIA. There appears to be high-grade stenosis of the hypogastric artery origin secondary to mixed calcified and soft plaque. Estimated 50% narrowing in the mid segment of the left EIA, with what appears to be greater than 50% narrowing at the inguinal canal. Moderate atherosclerotic changes of the left common femoral artery. Proximal profunda femoris and SFA patent, with calcified disease proximally. Veins: Unremarkable appearance of the venous system. Review of the MIP images confirms the above findings. NON-VASCULAR Lower chest: Small right-sided pleural  effusion with trace left-sided pleural fluid. Calcified pleural plaques at the bilateral lower lungs. Hepatobiliary: Nodular contour  of the liver surface. Unremarkable gall bladder. Pancreas: Unremarkable. Spleen: Transverse diameter of the spleen measures greater than 12 cm. Adrenals/Urinary Tract: - Right adrenal gland: Unremarkable - Left adrenal gland: Unremarkable. - Right kidney: No hydronephrosis, nephrolithiasis, inflammation, or ureteral dilation. No focal lesion. - Left Kidney: No hydronephrosis, nephrolithiasis, inflammation, or ureteral dilation. No focal lesion. - Urinary Bladder: Balloon retention catheter within the urinary bladder which is decompressed. Circumferential wall thickening, potentially chronic. Stomach/Bowel: - Stomach: Hiatal hernia.  Otherwise unremarkable stomach. - Small bowel: Small bowel decompressed with no transition point or distension. No accumulation of extravasation of contrast that would indicate acute hemorrhage. No focal inflammatory changes. - Appendix: Normal. - Colon: Mild stool burden. Colonic diverticula. No focal inflammatory changes or wall thickening. There is no accumulation of contrast that would indicate acute GI hemorrhage. Lymphatic: No adenopathy. Mesenteric: No free fluid or air. No mesenteric adenopathy. Reproductive: Transverse diameter of the prostate estimated 6.0 cm Other: Fat containing umbilical hernia. Evidence of injection granulomas of the abdominal wall. Fat containing inguinal hernias Musculoskeletal: Multilevel degenerative changes of the visualized thoracolumbar spine. No bony canal narrowing. IMPRESSION: CT angiogram negative for acute gastrointestinal hemorrhage. Multilevel atherosclerotic changes, including: -aortic atherosclerosis.  Aortic Atherosclerosis (ICD10-I70.0). -bilateral iliac arterial disease, with estimated 50% narrowing at the right CIA, estimated 50% narrowing or greater in the left EIA, and bilateral hypogastric artery origin narrowing, as above. 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 Small right pleural effusion and trace left pleural effusion. Calcified pleural plaques. Correlation with any history of prior specific exposure may be useful. 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 Additional ancillary findings as above. Signed, Dulcy Fanny. Dellia Nims, RPVI [facility] Radiology Electronically Signed   By: Corrie Mckusick D.O.   On: 09/05/2021 09:44    Labs:  CBC: Recent Labs    09/02/21 0300 09/03/21 0615 09/04/21 0704 09/05/21 0630  WBC 6.6 5.4 6.9 6.2  HGB 8.5* 8.1* 8.0* 7.1*  HCT 26.5* 25.7* 24.6* 22.0*  PLT 306 336 370 368    COAGS: Recent Labs    08/18/21 1736 08/19/21 0529 08/23/21 0131 08/25/21 0826  INR 1.2 1.4* 1.2 1.4*  APTT 51* 48*  --   --     BMP: Recent Labs    09/02/21 0300 09/03/21 0615 09/04/21 0501 09/05/21 0100  NA 132* 135 133* 134*  K 3.9 3.6 3.7 3.6  CL 99 101 97* 101  CO2 27 26 26 25   GLUCOSE 98 119* 158* 97  BUN 16 18 27* 30*  CALCIUM 7.6* 8.0* 8.2* 8.3*  CREATININE 1.18 1.29* 1.30* 1.28*  GFRNONAA >60 58* 57* 58*    LIVER FUNCTION TESTS: Recent Labs    08/19/21 0529 08/21/21 0217 08/22/21 0136 08/23/21 0131 08/24/21 0056 08/25/21 0826 08/26/21 0051 09/02/21 0300 09/03/21 0615 09/04/21 0501 09/05/21 0100  BILITOT 2.3*  --  3.2* 1.7*  --  0.9  --   --   --   --   --   AST 43*  --  65* 60*  --  26  --   --   --   --   --   ALT 14  --  20 14  --  9  --   --   --   --   --  ALKPHOS 192*  --  181* 223*  --  148*  --   --   --   --   --   PROT 7.1  --  7.5 7.5  --  6.7  --   --   --   --   --   ALBUMIN 2.2*   < > 2.2*  2.2* 2.1*  2.1*   < > 1.8*   < > <1.5* 1.6* 1.6* 1.6*   < > = values in this interval not displayed.    TUMOR MARKERS: No results for input(s): AFPTM, CEA, CA199, CHROMGRNA in the last 8760 hours.  Assessment and Plan:  Gastrointestinal  bleeding; duodenal ulcer; anemia: Leslie [name], 75 year old male, is tentatively scheduled today for an image-guided mesenteric arteriogram with possible intervention. I had previously met with the patient 08/31/21 to introduce IR and discuss the procedure and he remembered the encounter. We once again discussed his clinical situation and the procedure IR has been asked to do and he is in agreement to proceed. He has been NPO.   Risks and benefits of this procedure were discussed with the patient including, but not limited to bleeding, infection, vascular injury or contrast induced renal failure.  This interventional procedure involves the use of X-rays and because of the nature of the planned procedure, it is possible that we will have prolonged use of X-ray fluoroscopy.  Potential radiation risks to you include (but are not limited to) the following: - A slightly elevated risk for cancer  several years later in life. This risk is typically less than 0.5% percent. This risk is low in comparison to the normal incidence of human cancer, which is 33% for women and 50% for men according to the [place]. - Radiation induced injury can include skin redness, resembling a rash, tissue breakdown / ulcers and hair loss (which can be temporary or permanent).   The likelihood of either of these occurring depends on the difficulty of the procedure and whether you are sensitive to radiation due to previous procedures, disease, or genetic conditions.   IF your procedure requires a prolonged use of radiation, you will be notified and given written instructions for further action.  It is your responsibility to monitor the irradiated area for the 2 weeks following the procedure and to notify your physician if you are concerned that you have suffered a radiation induced injury.    All of the patient's questions were answered, patient is agreeable to proceed.  Consent signed and in IR  Thank you  for this interesting consult.  I greatly enjoyed meeting Leslie [name] and look forward to participating in their care.  A copy of this report was sent to the requesting provider on this date.  Electronically Signed: Soyla Dryer, AGACNP-BC [phone] 09/05/2021, 11:57 AM   I spent a total of 20 Minutes    in face to face in clinical consultation, greater than 50% of which was counseling/coordinating care for mesenteric arteriogram with possible intervention

## 2021-09-05 NOTE — Anesthesia Preprocedure Evaluation (Addendum)
Anesthesia Evaluation  Patient identified by MRN, date of birth, ID band Patient awake    Reviewed: Allergy & Precautions, NPO status , Patient's Chart, lab work & pertinent test results, reviewed documented beta blocker date and time   History of Anesthesia Complications Negative for: history of anesthetic complications  Airway Mallampati: II  TM Distance: >3 FB Neck ROM: Full    Dental  (+) Dental Advisory Given, Edentulous Lower, Edentulous Upper   Pulmonary asthma , COPD,  COPD inhaler, former smoker,    breath sounds clear to auscultation       Cardiovascular hypertension, Pt. on medications and Pt. on home beta blockers + Peripheral Vascular Disease   Rhythm:Regular Rate:Normal  1. Left ventricular ejection fraction, by estimation, is 60 to 65%. The  left ventricle has normal function. The left ventricle has no regional  wall motion abnormalities. There is mild concentric left ventricular  hypertrophy. Left ventricular diastolic  function could not be evaluated.  2. Right ventricular systolic function is normal. The right ventricular  size is normal. Tricuspid regurgitation signal is inadequate for assessing  PA pressure.  3. The mitral valve is degenerative. Trivial mitral valve regurgitation.  Mild mitral stenosis. Moderate to severe mitral annular calcification.  4. The aortic valve is grossly normal. Aortic valve regurgitation is not  visualized. No aortic stenosis is present.  5. The inferior vena cava is normal in size with greater than 50%  respiratory variability, suggesting right atrial pressure of 3 mmHg.    Neuro/Psych PSYCHIATRIC DISORDERS Anxiety  Neuromuscular disease    GI/Hepatic Neg liver ROS, GERD  Medicated,  Endo/Other  diabetes, Type 2, Insulin Dependent, Oral Hypoglycemic AgentsObesity   Renal/GU Renal InsufficiencyRenal disease (AKI)Lab Results      Component                Value                Date                      CREATININE               1.28 (H)            09/05/2021                Musculoskeletal  (+) Arthritis , Left Knee Infection   Abdominal   Peds  Hematology  (+) Blood dyscrasia, anemia , Lab Results      Component                Value               Date                      WBC                      6.2                 09/05/2021                HGB                      7.1 (L)             09/05/2021                HCT  22.0 (L)            09/05/2021                MCV                      88.7                09/05/2021                PLT                      368                 09/05/2021              Anesthesia Other Findings Day of surgery medications reviewed with the patient.  Reproductive/Obstetrics                           Anesthesia Physical  Anesthesia Plan  ASA: 3  Anesthesia Plan: MAC   Post-op Pain Management:    Induction: Intravenous  PONV Risk Score and Plan: 2 and Dexamethasone and Ondansetron  Airway Management Planned: Nasal Cannula and Natural Airway  Additional Equipment:   Intra-op Plan:   Post-operative Plan:   Informed Consent: I have reviewed the patients History and Physical, chart, labs and discussed the procedure including the risks, benefits and alternatives for the proposed anesthesia with the patient or authorized representative who has indicated his/her understanding and acceptance.     Dental advisory given  Plan Discussed with: CRNA and Anesthesiologist  Anesthesia Plan Comments: (No type/screen on arrival, Ordered and blood sent, Waiting for 3u pRBCs)      Anesthesia Quick Evaluation

## 2021-09-05 NOTE — Progress Notes (Signed)
Patient returned back to 4E from IR. VSS. Telemetry applied.  Daymon Larsen, RN

## 2021-09-05 NOTE — Interval H&P Note (Signed)
History and Physical Interval Note:  GI service contacted by the consulting IR service.  Patient has continued to have drifting anemia requiring intermittent blood products.  Underwent CTA today which was negative for extravasation.  CTA was notable for significant atherosclerotic disease along with near occlusion of the hepatic artery, which makes arteriogram and selective coil embolization technically difficult.  Based on the suspected continued bleeding from the large duodenal ulcer on previous upper endoscopy, GI was asked to reengage for repeat upper endoscopy for therapeutic intent along with possible placement of clip adjacent to the ulcer which would allow for IR targeting.  - EGD (08/25/2021, Dr. Candis Schatz): Severe esophagitis, several gastric ulcers with largest being 5 mm, deformed pylorus.  Large duodenal ulcer with visible vessel and active bleeding.  Treated with epinephrine.  Difficult positioning and acute angulation precluded further treatment with bipolar cautery.  H/H 7.1/22 today.  Ordered for 2 unit pRBCs now.  Last transfusion was 11/5 (1 unit).  1) Duodenal ulcer 2) Gastritis/Gastric ulcers 3) Esophagitis 4) Acute blood loss anemia  - Urgent EGD today for diagnostic and therapeutic intent  09/05/2021 2:51 PM  [name]  has presented today for surgery, with the diagnosis of Duodenal ulcer, acute blood loss anemia.  The various methods of treatment have been discussed with the patient and family. After consideration of risks, benefits and other options for treatment, the patient has consented to  Procedure(s): ESOPHAGOGASTRODUODENOSCOPY (EGD) (Left) as a surgical intervention.  The patient's history has been reviewed, patient examined, no change in status, stable for surgery.  I have reviewed the patient's chart and labs.  Questions were answered to the patient's satisfaction.     Dominic Pea Kieanna Rollo

## 2021-09-05 NOTE — Progress Notes (Signed)
   Subjective: I seen and evaluated Leslie [name] at bedside.  He was lying comfortably in bed.  He denies any complaints at this time.  He has been n.p.o. and is scheduled for procedure today.  He acknowledges and agrees.  Objective:  Vital signs in last 24 hours: Vitals:   09/04/21 2030 09/04/21 2356 09/05/21 0406 09/05/21 0723  BP: (!) 147/89 (!) 145/80 (!) 150/78 (!) 165/77  Pulse: 80 85 75 89  Resp: 20 20 20 19   Temp: 98.4 F (36.9 C) 98.5 F (36.9 C) 98.6 F (37 C) 98.2 F (36.8 C)  TempSrc: Oral Oral Oral Oral  SpO2: 98% 98% 98% 98%  Weight:   90.2 kg   Height:        Assessment/Plan:  Principal Problem:   Septic arthritis of knee (HCC) Active Problems:   S/P BKA (below knee amputation) bilateral (HCC)   Acute kidney injury ([place])   Altered mental status   S/P debridement   MSSA (methicillin susceptible Staphylococcus aureus) infection   Cellulitis of left lower extremity   Pressure injury of skin   Septic arthritis of the left knee POD 6 repeat I&D; Patient denies pain of the left knee.  Fluid collection noted on the medial aspect of the knee.  Unchanged from previous days.  Patient remains afebrile and WBC WNL --IV cefazolin 2 g every 8 hours-6 weeks per ID --IV Dilaudid for pain   Upper GI bleed secondary to duodenal ulcer Patient's hemoglobin continues to trend down.  Current hemoglobin is 7.1. CTA of abd/pelvis was obtained today and results reveal no accumulation of extravasation of contrast that would indicate acute hemorrhage. No focal inflammatory changes.  Patient scheduled for procedural EGD today per IR. -- 2 units of blood for transfusion ordered today due to low hemoglobin --Monitor hemoglobin postop  Hypertension Patient's blood pressure remains uncontrolled despite on 3 drug regimen and additional as needed.  Consider adding hydralazine 3 times daily. -- Amlodipine 10 mg daily --Losartan 100 mg daily --Toprol 100 mg daily --Hydralazine as needed  (if BP >130/90)   Medication adjustment --Taper off clonazepam    Prior to Admission Living Arrangement: Anticipated Discharge Location: Barriers to Discharge: Dispo: Anticipated discharge in approximately 1-2 day(s).   Timothy Lasso, MD 09/05/2021, 7:48 AM Pager: [phone] After 5pm on weekdays and 1pm on weekends: On Call pager [phone]

## 2021-09-06 ENCOUNTER — Encounter (HOSPITAL_COMMUNITY): Payer: Self-pay | Admitting: Gastroenterology

## 2021-09-06 DIAGNOSIS — D62 Acute posthemorrhagic anemia: Secondary | ICD-10-CM

## 2021-09-06 LAB — RENAL FUNCTION PANEL
Albumin: 1.9 g/dL — ABNORMAL LOW (ref 3.5–5.0)
Anion gap: 11 (ref 5–15)
BUN: 29 mg/dL — ABNORMAL HIGH (ref 8–23)
CO2: 23 mmol/L (ref 22–32)
Calcium: 8.3 mg/dL — ABNORMAL LOW (ref 8.9–10.3)
Chloride: 101 mmol/L (ref 98–111)
Creatinine, Ser: 1.33 mg/dL — ABNORMAL HIGH (ref 0.61–1.24)
GFR, Estimated: 56 mL/min — ABNORMAL LOW (ref >60)
Glucose, Bld: 82 mg/dL (ref 70–99)
Phosphorus: 4.3 mg/dL (ref 2.5–4.6)
Potassium: 4.2 mmol/L (ref 3.5–5.1)
Sodium: 135 mmol/L (ref 135–145)

## 2021-09-06 LAB — CBC
HCT: 29.4 % — ABNORMAL LOW (ref 39.0–52.0)
Hemoglobin: 9.1 g/dL — ABNORMAL LOW (ref 13.0–17.0)
MCH: 28.3 pg (ref 26.0–34.0)
MCHC: 31 g/dL (ref 30.0–36.0)
MCV: 91.6 fL (ref 80.0–100.0)
Platelets: 414 10*3/uL — ABNORMAL HIGH (ref 150–400)
RBC: 3.21 MIL/uL — ABNORMAL LOW (ref 4.22–5.81)
RDW: 16.2 % — ABNORMAL HIGH (ref 11.5–15.5)
WBC: 8.8 10*3/uL (ref 4.0–10.5)
nRBC: 0 % (ref 0.0–0.2)

## 2021-09-06 LAB — GLUCOSE, CAPILLARY
Glucose-Capillary: 136 mg/dL — ABNORMAL HIGH (ref 70–99)
Glucose-Capillary: 200 mg/dL — ABNORMAL HIGH (ref 70–99)
Glucose-Capillary: 91 mg/dL (ref 70–99)
Glucose-Capillary: 99 mg/dL (ref 70–99)

## 2021-09-06 LAB — HEMOGLOBIN AND HEMATOCRIT, BLOOD
HCT: 27.4 % — ABNORMAL LOW (ref 39.0–52.0)
Hemoglobin: 9.3 g/dL — ABNORMAL LOW (ref 13.0–17.0)

## 2021-09-06 LAB — VITAMIN A: Vitamin A (Retinoic Acid): 9.3 ug/dL — ABNORMAL LOW (ref 22.0–69.5)

## 2021-09-06 NOTE — Progress Notes (Signed)
Occupational Therapy Treatment Patient Details Name: Leslie [name] MRN: [id number] DOB: July 07, 1946 Today's Date: 09/06/2021   History of present illness Pt is a 75 y.o. male admitted 08/18/21 with AMS and L knee pain; workup for L knee sepsis. S/p L knee aspiration, then L knee I&D on 10/24 and again on 11/3. Course complicated by encephalopathy. MRI L femur 11/2 shows no osteomyelitis; L knee effusion with Baker's cyst; pt declined L knee MRI. PMH includes PAD s/p bilateral BKA (left 2017, right 2019), cellulitis, COPD, gout, arthritis, DM2.   OT comments  Pt continues to be self limiting with poor insight into benefits of OOB activity to prepare him to return home. Agreed to UE exercises only. Pt able to use rails to pull himself up. Elevated HOB for safe drinking. Continue to recommend SNF, but pt is currently refusing. Pt states, "I have my own way of doing things at home."   Recommendations for follow up therapy are one component of a multi-disciplinary discharge planning process, led by the attending physician.  Recommendations may be updated based on patient status, additional functional criteria and insurance authorization.    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); significant h/o falls       Mobility Bed Mobility               General bed mobility comments: able to pull himself up in bed prior to drinking with rails    Transfers                   General transfer comment: declined OOB to chair     Balance                                           ADL either performed or assessed  with clinical judgement   ADL   Eating/Feeding: Set up;Bed level Eating/Feeding Details (indicate cue type and reason): assist to open Ensure                                   General ADL Comments: declined ADL training    Extremity/Trunk Assessment              Vision       Perception     Praxis      Cognition Arousal/Alertness: Awake/alert Behavior During Therapy: Flat affect Overall Cognitive Status: Impaired/Different from baseline Area of Impairment: Safety/judgement                         Safety/Judgement: Decreased awareness of safety;Decreased awareness of deficits     General Comments: pt with poor insight into deficits and skills needed to return home safely, "I have my own way of doing things at home."          Exercises Exercises: General Upper Extremity General Exercises - Upper Extremity Shoulder Flexion: Strengthening;Both;Theraband;20 reps Theraband Level (Shoulder Flexion): Level 2 (Red) Shoulder Horizontal ABduction: Strengthening;Both;20 reps Theraband Level (Shoulder Horizontal Abduction): Level 2 (Red)  Shoulder Horizontal ADduction: AROM;20 reps;Theraband Theraband Level (Shoulder Horizontal Adduction): Level 2 (Red) Elbow Flexion: Strengthening;20 reps;Theraband Theraband Level (Elbow Flexion): Level 2 (Red) Elbow Extension: Strengthening;Both;20 reps;Theraband Theraband Level (Elbow Extension): Level 2 (Red)   Shoulder Instructions       General Comments      Pertinent Vitals/ Pain       Pain Assessment: No/denies pain  Home Living                                          Prior Functioning/Environment              Frequency  Min 2X/week        Progress Toward Goals  OT Goals(current goals can now be found in the care plan section)  Progress towards OT goals: Not progressing toward goals - comment (self limiting)  Acute Rehab OT Goals Patient Stated Goal: to go home OT  Goal Formulation: With patient Time For Goal Achievement: 09/21/21 Potential to Achieve Goals: [place] Discharge plan remains appropriate    Co-evaluation                 AM-PAC OT "6 Clicks" Daily Activity     Outcome Measure   Help from another person eating meals?: A Little Help from another person taking care of personal grooming?: A Little Help from another person toileting, which includes using toliet, bedpan, or urinal?: A Lot Help from another person bathing (including washing, rinsing, drying)?: A Lot Help from another person to put on and taking off regular upper body clothing?: A Little Help from another person to put on and taking off regular lower body clothing?: A Lot 6 Click Score: 15    End of Session    OT Visit Diagnosis: Other abnormalities of gait and mobility (R26.89);Muscle weakness (generalized) (M62.81);History of falling (Z91.81);Pain   Activity Tolerance Patient tolerated treatment well (self limiting)   Patient Left in bed;with call bell/phone within reach;with bed alarm set   Nurse Communication          Time: 9407-6808 OT Time Calculation (min): 15 min  Charges: OT General Charges $OT Visit: 1 Visit OT Treatments $Therapeutic Exercise: 8-22 mins  [name] 09/06/2021, 3:59 PM Nestor Lewandowsky, OTR/L Acute Rehabilitation Services Pager: [phone] Office: [phone]

## 2021-09-06 NOTE — Progress Notes (Signed)
Call received from MD to alert that patient scheduled to have 2nd unit of PRBCs. Request for unit sent to blood bank. Awaiting call for pickup.

## 2021-09-06 NOTE — Anesthesia Postprocedure Evaluation (Signed)
Anesthesia Post Note  Patient: RANALD ALESSIO  Procedure(s) Performed: ESOPHAGOGASTRODUODENOSCOPY (EGD) (Left) HOT HEMOSTASIS (ARGON PLASMA COAGULATION/BICAP) [place] SCLEROTHERAPY     Patient location during evaluation: PACU Anesthesia Type: MAC Level of consciousness: awake and alert Pain management: pain level controlled Vital Signs Assessment: post-procedure vital signs reviewed and stable Respiratory status: spontaneous breathing, nonlabored ventilation and respiratory function stable Cardiovascular status: stable and blood pressure returned to baseline Anesthetic complications: no   No notable events documented.  Last Vitals:  Vitals:   09/06/21 0533 09/06/21 0534  BP: (!) 160/84 (!) 160/84  Pulse: 92   Resp: 19 19  Temp: 36.6 C 36.6 C  SpO2:      Last Pain:  Vitals:   09/06/21 0534  TempSrc: Axillary  PainSc:                  Audry Pili

## 2021-09-06 NOTE — Progress Notes (Signed)
PT Cancellation Note  Patient Details Name: Leslie [name] MRN: [id number] DOB: Aug 26, 1946   Cancelled Treatment:    Reason Eval/Treat Not Completed: Patient declined, no reason specified.  Pt declined PT reporting he does not feel well enough to do tx and did not see the point as he is doing self ROM.  Talked with him about the purpose of PT but still declines for now.  Retry as time and pt allow.   Ramond Dial 09/06/2021, 11:47 AM  Mee Hives, PT PhD Acute Rehab Dept. Number: [place] [facility] [place]

## 2021-09-06 NOTE — Progress Notes (Signed)
Subjective: I seen and evaluated Leslie [name] at bedside.  He was lying comfortably in bed.  He denies any complaints at this time.  He states that when he swallows he does not feel much discomfort as opposed to previous days.  He reports some tenderness in his left residual limb.  He states that he had a bowel movement last night and this morning and states that the stool was brown in color.   Patient expresses interest in wanting to go home following discharge.  According to PT/OT patient will require SNF for physical deconditioning and assistance with ADLs.  Patient believes he can go home and "do it on his own".  Patient was reassured that he can eventually go home, however, his current condition requires some assistance.  We will reach out to [facility] on Monday, as they are closed today.  Further discussion regarding antibiotics maintenance at discharge, as patient will require long duration of IV treatment, will be explored with the discussion with [facility] Monday.    Objective:  Vital signs in last 24 hours: Vitals:   09/06/21 0534 09/06/21 0812 09/06/21 0815 09/06/21 1204  BP: (!) 160/84 (!) 162/87 (!) 162/87 (!) 154/74  Pulse:  92 92 65  Resp: 19 18 18 20   Temp: 97.9 F (36.6 C) 98 F (36.7 C) 98 F (36.7 C) 98.1 F (36.7 C)  TempSrc: Axillary Oral Oral Oral  SpO2:  99% 100% 96%  Weight:      Height:       Physical Exam HENT:     Head: Normocephalic and atraumatic.  Eyes:     General: Lids are normal.  Cardiovascular:     Rate and Rhythm: Normal rate and regular rhythm.     Heart sounds: Normal heart sounds.  Pulmonary:     Effort: Pulmonary effort is normal.     Breath sounds: Normal breath sounds.  Abdominal:     General: Abdomen is protuberant. Bowel sounds are normal.     Palpations: Abdomen is soft.  Genitourinary:    Comments: Foley present Musculoskeletal:     Comments: Left residual limb- increased redness surrounding incision site; warm to touch compared to  contralateral limb. Hematoma located on the medial side of knee tracking towards upwards along the left knee.     Right Lower Extremity: Right leg is amputated below knee.     Left Lower Extremity: Left leg is amputated below knee.  Skin:    General: Skin is warm and dry.  Neurological:     General: No focal deficit present.     Mental Status: He is alert.  Psychiatric:        Attention and Perception: Attention normal.        Behavior: Behavior is cooperative.        Cognition and Memory: Cognition normal.         Assessment/Plan:  Principal Problem:   Septic arthritis of knee (HCC) Active Problems:   S/P BKA (below knee amputation) bilateral (HCC)   Acute kidney injury ([place])   Altered mental status   S/P debridement   MSSA (methicillin susceptible Staphylococcus aureus) infection   Cellulitis of left lower extremity   Pressure injury of skin   Duodenal ulcer   Gastritis and gastroduodenitis   Acute blood loss anemia   Dieulafoy lesion of duodenum   Septic arthritis of the left knee POD 7 repeat I&D; patient reports tenderness to the left residual knee.  On physical exam, increased redness around  incision site.  Hematoma persist with no improvement from previous day.  Overall knee feels boggy to palpation. Patient was unable to work with PT today due to discomfort.  CBC reveals white count within normal limits.  Patient remains afebrile. --Ortho consulted --IV cefazolin 2 g every 8 hours-6 weeks per ID --IV Dilaudid for pain   Upper GI bleed secondary to duodenal ulcer POD 1 of procedure EGD.  No complications and source of bleeding is now controlled.  Patient reports bowel movement last night and this morning and reports stool is brown in color.  CBC reveals improvement in hemoglobin at 9.1 posttransfusion of 2 units yesterday -- Monitor hemoglobin  Hypertension Blood pressure permissive for now.  Continue current management -- Amlodipine 10 mg daily --Losartan  100 mg daily --Toprol 100 mg daily --Hydralazine as needed (if BP >130/90)   Medication adjustment --Taper off clonazepam   Prior to Admission Living Arrangement: Anticipated Discharge Location: Barriers to Discharge: Dispo: Anticipated discharge in approximately 1-2 day(s).   Timothy Lasso, MD 09/06/2021, 12:25 PM Pager: [phone] After 5pm on weekdays and 1pm on weekends: On Call pager [phone]

## 2021-09-06 NOTE — Progress Notes (Signed)
Leslie [name]   Subjective: EGD completed yesterday and notable for active vascular bleeding in the duodenal bulb, successfully treated with bipolar cautery, 3 hemostatic clips, and epinephrine injection.  No recurrent bleeding overnight.  H/H stable after transfusion, currently 9.3/27.4.   Objective: Vital signs in last 24 hours: Temp:  [97.9 F (36.6 C)-99.3 F (37.4 C)] 98.1 F (36.7 C) (11/11 1204) Pulse Rate:  [65-92] 65 (11/11 1204) Resp:  [12-26] 20 (11/11 1204) BP: (146-170)/(47-87) 154/74 (11/11 1204) SpO2:  [90 %-100 %] 96 % (11/11 1204) Weight:  [90.1 kg] 90.1 kg (11/11 0342) Last BM Date: 09/05/21 General: NAD Abdomen:  Soft, NT, ND, +BS Ext: b/l LE amputee   Intake/Output from previous day: 11/10 0701 - 11/11 0700 In: 615 [I.V.:300; Blood:315] Out: -  Intake/Output this shift: Total I/O In: 354 [Blood:354] Out: -    Lab Results: Recent Labs    09/04/21 0704 09/05/21 0630 09/06/21 0223 09/06/21 1300  WBC 6.9 6.2 8.8  --   HGB 8.0* 7.1* 9.1* 9.3*  PLT 370 368 414*  --   MCV 87.9 88.7 91.6  --    BMET Recent Labs    09/04/21 0501 09/05/21 0100 09/06/21 0223  NA 133* 134* 135  K 3.7 3.6 4.2  CL 97* 101 101  CO2 26 25 23   GLUCOSE 158* 97 82  BUN 27* 30* 29*  CREATININE 1.30* 1.28* 1.33*  CALCIUM 8.2* 8.3* 8.3*   LFT Recent Labs    09/04/21 0501 09/05/21 0100 09/06/21 0223  ALBUMIN 1.6* 1.6* 1.9*   PT/INR No results for input(s): INR in the last 72 hours.    Imaging/Other results: CT ANGIO GI BLEED  Result Date: 09/05/2021 CLINICAL DATA:  75 year old male with history of GI bleeding EXAM: CTA ABDOMEN AND PELVIS WITHOUT AND WITH CONTRAST TECHNIQUE: Multidetector CT imaging of the abdomen and pelvis was performed using the standard protocol during bolus administration of intravenous contrast. Multiplanar reconstructed images and MIPs were obtained and reviewed to evaluate the vascular anatomy. CONTRAST:  164mL  OMNIPAQUE IOHEXOL 350 MG/ML SOLN COMPARISON:  08/23/2021 FINDINGS: VASCULAR Aorta: Moderate atherosclerotic changes of the distal thoracic aorta and the abdominal aorta. No ulcerated plaque, dissection, aneurysm, periaortic fluid. Celiac: Moderate atherosclerotic changes of the celiac artery without evidence of high-grade stenosis at the origin. There is evidence of a developing stenosis of the common hepatic artery secondary to soft plaque. SMA: Atherosclerotic changes at the origin of the SMA without evidence of high-grade stenosis. Branches are patent. Renals: - Right: The main right renal artery demonstrates high-grade stenosis at the origin secondary to mixed calcified and soft plaque. There is a secondary an accessory right renal artery in the lower abdominal aorta to the lower pole segment. - Left: Single left renal artery without evidence of high-grade stenosis at the origin secondary to mixed calcified and soft plaque. IMA: Inferior mesenteric artery is patent. Right lower extremity: Moderate to advanced calcified atherosclerotic plaque of the common iliac artery, hypogastric artery origin, external iliac artery. Estimated 50% narrowing in the CIA, high-grade stenosis at the origin of the hypogastric artery, and no significant stenosis in the EIA. Moderate atherosclerotic changes of the common femoral artery, predominantly distally at the bifurcation. Proximal profunda femoris and proximal SFA patent with atherosclerotic changes. Left lower extremity: Moderate to advanced atherosclerotic changes of the left common iliac artery, hypogastric artery, external iliac artery. No high-grade stenosis of the CIA. There appears to be high-grade stenosis of the hypogastric artery origin secondary to  mixed calcified and soft plaque. Estimated 50% narrowing in the mid segment of the left EIA, with what appears to be greater than 50% narrowing at the inguinal canal. Moderate atherosclerotic changes of the left common  femoral artery. Proximal profunda femoris and SFA patent, with calcified disease proximally. Veins: Unremarkable appearance of the venous system. Review of the MIP images confirms the above findings. NON-VASCULAR Lower chest: Small right-sided pleural effusion with trace left-sided pleural fluid. Calcified pleural plaques at the bilateral lower lungs. Hepatobiliary: Nodular contour of the liver surface. Unremarkable gall bladder. Pancreas: Unremarkable. Spleen: Transverse diameter of the spleen measures greater than 12 cm. Adrenals/Urinary Tract: - Right adrenal gland: Unremarkable - Left adrenal gland: Unremarkable. - Right kidney: No hydronephrosis, nephrolithiasis, inflammation, or ureteral dilation. No focal lesion. - Left Kidney: No hydronephrosis, nephrolithiasis, inflammation, or ureteral dilation. No focal lesion. - Urinary Bladder: Balloon retention catheter within the urinary bladder which is decompressed. Circumferential wall thickening, potentially chronic. Stomach/Bowel: - Stomach: Hiatal hernia.  Otherwise unremarkable stomach. - Small bowel: Small bowel decompressed with no transition point or distension. No accumulation of extravasation of contrast that would indicate acute hemorrhage. No focal inflammatory changes. - Appendix: Normal. - Colon: Mild stool burden. Colonic diverticula. No focal inflammatory changes or wall thickening. There is no accumulation of contrast that would indicate acute GI hemorrhage. Lymphatic: No adenopathy. Mesenteric: No free fluid or air. No mesenteric adenopathy. Reproductive: Transverse diameter of the prostate estimated 6.0 cm Other: Fat containing umbilical hernia. Evidence of injection granulomas of the abdominal wall. Fat containing inguinal hernias Musculoskeletal: Multilevel degenerative changes of the visualized thoracolumbar spine. No bony canal narrowing. IMPRESSION: CT angiogram negative for acute gastrointestinal hemorrhage. Multilevel atherosclerotic  changes, including: -aortic atherosclerosis.  Aortic Atherosclerosis (ICD10-I70.0). -bilateral iliac arterial disease, with estimated 50% narrowing at the right CIA, estimated 50% narrowing or greater in the left EIA, and bilateral hypogastric artery origin narrowing, as above. 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 Small right pleural effusion and trace left pleural effusion. Calcified pleural plaques. Correlation with any history of prior specific exposure may be useful. 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 Additional ancillary findings as above. Signed, Dulcy Fanny. Dellia Nims, RPVI [facility] Radiology Electronically Signed   By: Corrie Mckusick D.O.   On: 09/05/2021 09:44      Assessment and Plan:  1) Duodenal Dieulafoy bleed 2) Duodenal ulcer 3) Acute blood loss anemia  - Now stable after EGD with endoscopic treatment of vascular bleeding lesion in the duodenal bulb on 11/10 - Of [name], patient with significant vasculopathy and atherosclerotic disease which would make CTA quite challenging in the event of rebleed - H/H stable after transfusions yesterday and no further overt bleeding - Okay to advance diet as tolerated - Continue Protonix 40 mg IV bid today then can convert to Protonix po bid x6 weeks, then reduce to 40 mg/day - GI service will sign off at this time.  Please do not hesitate to contact with additional questions or concerns    Lavena Bullion, DO  09/06/2021, 2:46 PM [facility] Pager [phone]

## 2021-09-07 LAB — RENAL FUNCTION PANEL
Albumin: 1.8 g/dL — ABNORMAL LOW (ref 3.5–5.0)
Anion gap: 8 (ref 5–15)
BUN: 23 mg/dL (ref 8–23)
CO2: 26 mmol/L (ref 22–32)
Calcium: 8.2 mg/dL — ABNORMAL LOW (ref 8.9–10.3)
Chloride: 100 mmol/L (ref 98–111)
Creatinine, Ser: 1.21 mg/dL (ref 0.61–1.24)
GFR, Estimated: >60 mL/min (ref >60)
Glucose, Bld: 114 mg/dL — ABNORMAL HIGH (ref 70–99)
Phosphorus: 4 mg/dL (ref 2.5–4.6)
Potassium: 3.8 mmol/L (ref 3.5–5.1)
Sodium: 134 mmol/L — ABNORMAL LOW (ref 135–145)

## 2021-09-07 LAB — BPAM RBC
Blood Product Expiration Date: 202211252359
Blood Product Expiration Date: 202211302359
ISSUE DATE / TIME: 202211101604
ISSUE DATE / TIME: 202211110507
Unit Type and Rh: 9500
Unit Type and Rh: 9500

## 2021-09-07 LAB — BASIC METABOLIC PANEL
Anion gap: 8 (ref 5–15)
BUN: 24 mg/dL — ABNORMAL HIGH (ref 8–23)
CO2: 26 mmol/L (ref 22–32)
Calcium: 8.1 mg/dL — ABNORMAL LOW (ref 8.9–10.3)
Chloride: 99 mmol/L (ref 98–111)
Creatinine, Ser: 1.22 mg/dL (ref 0.61–1.24)
GFR, Estimated: >60 mL/min (ref >60)
Glucose, Bld: 113 mg/dL — ABNORMAL HIGH (ref 70–99)
Potassium: 3.8 mmol/L (ref 3.5–5.1)
Sodium: 133 mmol/L — ABNORMAL LOW (ref 135–145)

## 2021-09-07 LAB — CBC WITH DIFFERENTIAL/PLATELET
Abs Immature Granulocytes: 0.08 10*3/uL — ABNORMAL HIGH (ref 0.00–0.07)
Basophils Absolute: 0.1 10*3/uL (ref 0.0–0.1)
Basophils Relative: 1 %
Eosinophils Absolute: 0.2 10*3/uL (ref 0.0–0.5)
Eosinophils Relative: 2 %
HCT: 28.2 % — ABNORMAL LOW (ref 39.0–52.0)
Hemoglobin: 9.5 g/dL — ABNORMAL LOW (ref 13.0–17.0)
Immature Granulocytes: 1 %
Lymphocytes Relative: 19 %
Lymphs Abs: 1.7 10*3/uL (ref 0.7–4.0)
MCH: 29.7 pg (ref 26.0–34.0)
MCHC: 33.7 g/dL (ref 30.0–36.0)
MCV: 88.1 fL (ref 80.0–100.0)
Monocytes Absolute: 0.9 10*3/uL (ref 0.1–1.0)
Monocytes Relative: 10 %
Neutro Abs: 6 10*3/uL (ref 1.7–7.7)
Neutrophils Relative %: 67 %
Platelets: 387 10*3/uL (ref 150–400)
RBC: 3.2 MIL/uL — ABNORMAL LOW (ref 4.22–5.81)
RDW: 15.9 % — ABNORMAL HIGH (ref 11.5–15.5)
WBC: 8.9 10*3/uL (ref 4.0–10.5)
nRBC: 0 % (ref 0.0–0.2)

## 2021-09-07 LAB — GLUCOSE, CAPILLARY
Glucose-Capillary: 125 mg/dL — ABNORMAL HIGH (ref 70–99)
Glucose-Capillary: 168 mg/dL — ABNORMAL HIGH (ref 70–99)
Glucose-Capillary: 87 mg/dL (ref 70–99)
Glucose-Capillary: 109 mg/dL — ABNORMAL HIGH (ref 70–99)

## 2021-09-07 LAB — TYPE AND SCREEN
ABO/RH(D): O NEG
Antibody Screen: NEGATIVE
Unit division: 0
Unit division: 0

## 2021-09-07 MED ORDER — PANTOPRAZOLE SODIUM 40 MG PO TBEC
40.0000 mg | DELAYED_RELEASE_TABLET | Freq: Two times a day (BID) | ORAL | Status: DC
  Administered 2021-09-08 – 2021-09-13 (×10): 40 mg via ORAL
  Filled 2021-09-07 (×11): qty 1

## 2021-09-07 MED ORDER — HYDRALAZINE HCL 25 MG PO TABS
25.0000 mg | ORAL_TABLET | Freq: Three times a day (TID) | ORAL | Status: DC
  Administered 2021-09-07 – 2021-09-13 (×17): 25 mg via ORAL
  Filled 2021-09-07 (×17): qty 1

## 2021-09-07 NOTE — Progress Notes (Signed)
Pt has developed a large rash with raised edges on L buttocks. Pt reports that they do not feel any pain , discomfort, or itching at site of rash.  No other symptoms to report at this time.

## 2021-09-07 NOTE — Progress Notes (Signed)
Subjective: Patient was assessed at the bedside laying in bed.  Patient seems more somnolent this morning. He denied any acute complaints.  Objective:  Vital signs in last 24 hours: Vitals:   09/07/21 0432 09/07/21 0756 09/07/21 0938 09/07/21 1151  BP: (!) 132/54 (!) 179/78 (!) 182/79 (!) 178/83  Pulse: 89 89  91  Resp: 16 16  16   Temp: 98.2 F (36.8 C) 98.5 F (36.9 C)  98.3 F (36.8 C)  TempSrc: Oral Oral  Axillary  SpO2: 98% 98%  100%  Weight: 90.3 kg     Height:       General: Sleeping in bed this morning.  CV: RRR. No murmurs, rubs, or gallops. No LE edema Pulmonary: Anterior lung sounds clear to auscultation bilaterally. Extremities: Palpable radial.  Left BKA.  Left knee with surrounding erythema around incision site (staples) and localized fluid collection on the medial side also with erythema. Skin: Warm and dry. No obvious rash or lesions. Psych: Seems depressed   Assessment/Plan:  Principal Problem:   Septic arthritis of knee (HCC) Active Problems:   S/P BKA (below knee amputation) bilateral (HCC)   Acute kidney injury ([place])   Altered mental status   S/P debridement   MSSA (methicillin susceptible Staphylococcus aureus) infection   Cellulitis of left lower extremity   Pressure injury of skin   Duodenal ulcer   Gastritis and gastroduodenitis   Acute blood loss anemia   Dieulafoy lesion of duodenum   Septic arthritis of the left knee BKA with septic arthritis of the left knee. Status post I&D with Ortho.  Continues to have fluid pocket with ambulation along the medial aspect of the knee and moderate erythema along the incision site that is concerning. No leukocytosis at the moment. Patient remains afebrile.  -- Patient refused left AKA per Ortho this morning, plan for possible repeat I&D with Dr. Alvan Dame -- Continue IV cefazolin 2 g q6h  -- Continue IV Dilaudid 1mg  q4h prn for pain --Continue antiemetics, bowel regimen  Upper GI bleed 2/2 duodenal  ulcer S/p EGD with endoscopic treatment of vascular bleeding lesion in the duodenal bulb on 11/10 and transfusion of 6 units of blood.  Hemoglobin stable at 9.5 today.  No melenic stools this morning. --GI signed off --Transition from IV Protonix to p.o. Protonix 40 mg twice daily today, continue for 6 weeks then reduce to 40 mg daily --Continue sucralfate  --Daily CBC --IV maintenance fluid  Hypertension BP remains relatively uncontrolled. SBP in the 170s to 180s this morning.  Plan to switch patient's as needed hydralazine to scheduled. -- Continue amlodipine 10 mg daily -- Continue losartan 100 mg daily -- Continue Toprol 100 mg daily -- Hydralazine 25 mg 3 times daily  Type 2 diabetes A1c 5.8 on admission. Relatively controlled. CBGs trending up after advancing diet. This morning, blood sugar in the 120s to 160s. --Continue Semglee 15 units at bedtime --Continue NovoLog 3 units with meals  COPD: Stable.  Continue as needed albuterol nebulizer Anxiety: Weaning off Klonopin. Continue mirtazapine 7.5 mg at bedtime for sleep History of gout: Continue allopurinol 100 mg daily  Medication adjustment -- Continue to taper off Klonopin   Discharge planning PT/OT recommending SNF. However patient resistant to SNF placement and would like to go home. Unable to go home due to need for IV antibiotic therapy. [facility] unable to accommodate patient at home for IV antibiotics. Pending placement [facility]. --Pending placement, appreciate CSW and [facility] team assistance with placement  Prior to Admission Living Arrangement: Home with pace Anticipated Discharge Location: SNF  Barriers to Discharge: Pending SNF placement Dispo: Anticipated discharge in approximately 1-2 day(s).   Lacinda Axon, MD 09/07/2021, 2:58 PM Pager: [phone] After 5pm on weekdays and 1pm on weekends: On Call pager [phone]

## 2021-09-07 NOTE — Progress Notes (Signed)
Patient ID: Leslie [name], male   DOB: [birth year], 75 y.o.   MRN: [id number] Patient is seen in follow-up, he is a bilateral transtibial amputee who is status post irrigation debridement for septic left knee with Dr. Alvan Dame.  Examination this time patient has swelling induration and cellulitis along the medial tibia as well as over the incision.  Review of the MRI scan shows fluid contiguous from the knee joint with the area of the medial tibial plateau.  There is no air in the soft tissue however with the clinical findings and the contiguous fluid this does appear to be a recurrent septic joint.  There are no bony changes at this time.  I discussed with the patient my recommendation would be to proceed with a left above-the-knee amputation.  Patient states he does not want to consider further amputation.  I recommend reviewing this with Dr. Alvan Dame and see if he feels that a repeat debridement of the knee is an option.

## 2021-09-08 LAB — CBC WITH DIFFERENTIAL/PLATELET
Abs Immature Granulocytes: 0.08 10*3/uL — ABNORMAL HIGH (ref 0.00–0.07)
Basophils Absolute: 0.1 10*3/uL (ref 0.0–0.1)
Basophils Relative: 1 %
Eosinophils Absolute: 0.2 10*3/uL (ref 0.0–0.5)
Eosinophils Relative: 2 %
HCT: 28.5 % — ABNORMAL LOW (ref 39.0–52.0)
Hemoglobin: 9.3 g/dL — ABNORMAL LOW (ref 13.0–17.0)
Immature Granulocytes: 1 %
Lymphocytes Relative: 19 %
Lymphs Abs: 1.8 10*3/uL (ref 0.7–4.0)
MCH: 29 pg (ref 26.0–34.0)
MCHC: 32.6 g/dL (ref 30.0–36.0)
MCV: 88.8 fL (ref 80.0–100.0)
Monocytes Absolute: 1 10*3/uL (ref 0.1–1.0)
Monocytes Relative: 10 %
Neutro Abs: 6.3 10*3/uL (ref 1.7–7.7)
Neutrophils Relative %: 67 %
Platelets: 361 10*3/uL (ref 150–400)
RBC: 3.21 MIL/uL — ABNORMAL LOW (ref 4.22–5.81)
RDW: 16 % — ABNORMAL HIGH (ref 11.5–15.5)
WBC: 9.4 10*3/uL (ref 4.0–10.5)
nRBC: 0 % (ref 0.0–0.2)

## 2021-09-08 LAB — BASIC METABOLIC PANEL
Anion gap: 8 (ref 5–15)
BUN: 19 mg/dL (ref 8–23)
CO2: 25 mmol/L (ref 22–32)
Calcium: 7.8 mg/dL — ABNORMAL LOW (ref 8.9–10.3)
Chloride: 97 mmol/L — ABNORMAL LOW (ref 98–111)
Creatinine, Ser: 1.01 mg/dL (ref 0.61–1.24)
GFR, Estimated: >60 mL/min (ref >60)
Glucose, Bld: 99 mg/dL (ref 70–99)
Potassium: 3.6 mmol/L (ref 3.5–5.1)
Sodium: 130 mmol/L — ABNORMAL LOW (ref 135–145)

## 2021-09-08 LAB — SODIUM, URINE, RANDOM: Sodium, Ur: 44 mmol/L

## 2021-09-08 LAB — GLUCOSE, CAPILLARY
Glucose-Capillary: 110 mg/dL — ABNORMAL HIGH (ref 70–99)
Glucose-Capillary: 139 mg/dL — ABNORMAL HIGH (ref 70–99)
Glucose-Capillary: 100 mg/dL — ABNORMAL HIGH (ref 70–99)
Glucose-Capillary: 167 mg/dL — ABNORMAL HIGH (ref 70–99)

## 2021-09-08 LAB — OSMOLALITY, URINE: Osmolality, Ur: 345 mOsm/kg (ref 300–900)

## 2021-09-08 MED ORDER — NYSTATIN 100000 UNIT/GM EX CREA
TOPICAL_CREAM | Freq: Two times a day (BID) | CUTANEOUS | Status: DC
  Administered 2021-09-10: 1 via TOPICAL
  Filled 2021-09-08: qty 15

## 2021-09-08 NOTE — Progress Notes (Signed)
Subjective: I seen and evaluated Leslie [name] at bedside.  He states mild tenderness of the left residual limb.  Patient also reports soreness of the left buttocks.  Patient reports bowel movement last night.  Patient denies fever or chills.  Patient expresses disappointment in worsening condition of his left residual limb.    Objective:  Vital signs in last 24 hours: Vitals:   09/08/21 0322 09/08/21 0500 09/08/21 0750 09/08/21 0935  BP: (!) 169/76  (!) 148/64 (!) 154/55  Pulse: 80     Resp: 16  17   Temp: 97.8 F (36.6 C)  98.1 F (36.7 C)   TempSrc: Oral  Axillary   SpO2: 96%  96%   Weight:  82.4 kg    Height:       Physical Exam HENT:     Head: Normocephalic and atraumatic.  Cardiovascular:     Rate and Rhythm: Normal rate and regular rhythm.     Heart sounds: Normal heart sounds.  Pulmonary:     Effort: Pulmonary effort is normal.     Breath sounds: Normal breath sounds.  Abdominal:     General: Abdomen is protuberant.  Genitourinary:    Comments: Foley present Red, raised demarcated rash present on left buttock Musculoskeletal:     Comments: Large fluid collection located medial side of left knee; erythema noted along incision site. Warmth and tenderness with palpation.     Right Lower Extremity: Right leg is amputated below knee.     Left Lower Extremity: Left leg is amputated below knee.  Skin:    General: Skin is warm and dry.  Neurological:     Mental Status: He is alert.  Psychiatric:        Behavior: Behavior is cooperative.        Assessment/Plan:  Principal Problem:   Septic arthritis of knee (HCC) Active Problems:   S/P BKA (below knee amputation) bilateral (HCC)   Acute kidney injury ([place])   Altered mental status   S/P debridement   MSSA (methicillin susceptible Staphylococcus aureus) infection   Cellulitis of left lower extremity   Pressure injury of skin   Duodenal ulcer   Gastritis and gastroduodenitis   Acute blood loss anemia    Dieulafoy lesion of duodenum  Septic arthritis of the left knee Hematoma present on the medial side of the left residual limb has been worsening for the last several days.  Worsening erythema along incision site concerning for reinfection.  Fluid collection extends along medial side of the left knee. Per Ortho, plan is for possible AKA of the left or repeat I&D with Dr. Alvan Dame.  Patient declines amputation at this time.  And prefers I&D. --Possible repeat I&D --Continue IV cefazolin 2 g every 6H  Upper GI bleed secondary to duodenal ulcer, resolved S/P procedure EGD on 09/05/21; hemoglobin stable at 9.3 today.  Last bowel movement overnight, no melenic stool. --Protonix 40 mg twice daily; continue for 6 weeks then reduce to 40 mg daily --Sucralfate -- Daily CBC  Hypertension Patient has been on scheduled hydralazine over the last 24 hours with slight improvement in blood pressure SBP previously >160s currently SBP 140s to 150s. Although not controlled, will continue to monitor for further improvement. -- Amlodipine 10 mg daily --Losartan 100 mg daily --Toprol 100 mg daily --Hydralazine 25 mg 3 times daily  Intertrigo of the left buttock Overnight nurse noted a red, raised demarcated rash.  Patient reports soreness at the site of rash.  Patient denies  pruritus, fever or chills. --Nystatin cream twice daily --Keep area clean and dry --Monitor for improvement  Hyponatremia Sodium level today at 130; patient is currently asymptomatic; continue to monitor  --UrNA+ pending --UrOsom pending --Daily BMP  Type 2 diabetes A1c 5.8% on admission.  CBGs <120 -- Semglee 15 units at bedtime --NovoLog 3 units with meals  Medication adjustment --Continue to taper off Klonopin  Discharge planning PT/OT recommending SNF. However patient resistant to SNF placement and would like to go home. Unable to go home due to need for IV antibiotic therapy. [facility] unable to accommodate patient at home for IV  antibiotics. Pending placement [facility]. --Pending placement, appreciate CSW and [facility] team assistance with placement    Prior to Admission Living Arrangement: Anticipated Discharge Location: Barriers to Discharge: Dispo: Anticipated discharge in approximately 1-2 day(s).   Leslie Lasso, MD 09/08/2021, 9:42 AM Pager: [phone] After 5pm on weekdays and 1pm on weekends: On Call pager [phone]

## 2021-09-09 ENCOUNTER — Encounter: Payer: Self-pay | Admitting: Gastroenterology

## 2021-09-09 DIAGNOSIS — E119 Type 2 diabetes mellitus without complications: Secondary | ICD-10-CM

## 2021-09-09 DIAGNOSIS — E871 Hypo-osmolality and hyponatremia: Secondary | ICD-10-CM

## 2021-09-09 LAB — CBC WITH DIFFERENTIAL/PLATELET
Abs Immature Granulocytes: 0.11 10*3/uL — ABNORMAL HIGH (ref 0.00–0.07)
Basophils Absolute: 0.1 10*3/uL (ref 0.0–0.1)
Basophils Relative: 1 %
Eosinophils Absolute: 0.2 10*3/uL (ref 0.0–0.5)
Eosinophils Relative: 2 %
HCT: 27.8 % — ABNORMAL LOW (ref 39.0–52.0)
Hemoglobin: 8.8 g/dL — ABNORMAL LOW (ref 13.0–17.0)
Immature Granulocytes: 1 %
Lymphocytes Relative: 21 %
Lymphs Abs: 2.1 10*3/uL (ref 0.7–4.0)
MCH: 28.3 pg (ref 26.0–34.0)
MCHC: 31.7 g/dL (ref 30.0–36.0)
MCV: 89.4 fL (ref 80.0–100.0)
Monocytes Absolute: 1.1 10*3/uL — ABNORMAL HIGH (ref 0.1–1.0)
Monocytes Relative: 11 %
Neutro Abs: 6.6 10*3/uL (ref 1.7–7.7)
Neutrophils Relative %: 64 %
Platelets: 379 10*3/uL (ref 150–400)
RBC: 3.11 MIL/uL — ABNORMAL LOW (ref 4.22–5.81)
RDW: 15.9 % — ABNORMAL HIGH (ref 11.5–15.5)
WBC: 10.2 10*3/uL (ref 4.0–10.5)
nRBC: 0 % (ref 0.0–0.2)

## 2021-09-09 LAB — GLUCOSE, CAPILLARY
Glucose-Capillary: 107 mg/dL — ABNORMAL HIGH (ref 70–99)
Glucose-Capillary: 156 mg/dL — ABNORMAL HIGH (ref 70–99)
Glucose-Capillary: 152 mg/dL — ABNORMAL HIGH (ref 70–99)
Glucose-Capillary: 106 mg/dL — ABNORMAL HIGH (ref 70–99)

## 2021-09-09 LAB — BASIC METABOLIC PANEL
Anion gap: 9 (ref 5–15)
BUN: 21 mg/dL (ref 8–23)
CO2: 23 mmol/L (ref 22–32)
Calcium: 8 mg/dL — ABNORMAL LOW (ref 8.9–10.3)
Chloride: 100 mmol/L (ref 98–111)
Creatinine, Ser: 1.24 mg/dL (ref 0.61–1.24)
GFR, Estimated: >60 mL/min (ref >60)
Glucose, Bld: 89 mg/dL (ref 70–99)
Potassium: 4 mmol/L (ref 3.5–5.1)
Sodium: 132 mmol/L — ABNORMAL LOW (ref 135–145)

## 2021-09-09 MED ORDER — VITAMIN A 3 MG (10000 UNIT) PO CAPS
10000.0000 [IU] | ORAL_CAPSULE | Freq: Every day | ORAL | Status: DC
  Administered 2021-09-09 – 2021-09-13 (×5): 10000 [IU] via ORAL
  Filled 2021-09-09 (×5): qty 1

## 2021-09-09 NOTE — Progress Notes (Signed)
Discussed case with Dr. Alvan Dame who does not feel like further debridement would be helpful and also recommends amputation. Please let us know if pt changes his mind regarding this.    Lisette Abu, PA-C Orthopedic Surgery [phone]

## 2021-09-09 NOTE — Care Management Important Message (Signed)
Important Message  Patient Details  Name: Leslie [name] MRN: [id number] Date of Birth: 28-Oct-1945   Medicare Important Message Given:  Yes     Shelda Altes 09/09/2021, 10:39 AM

## 2021-09-09 NOTE — Consult Note (Signed)
WOC Nurse Consult Note: Patient receiving care in [place]. Reason for Consult: buttocks wound Wound type: healing MASD wounds to buttocks/sacrum/coccyx no longer in need of Santyl Pressure Injury POA: Yes/No/NA Measurement: Wound bed: all open areas are pink and clean. Drainage (amount, consistency, odor)  Periwound: heavily impacted by fungal overgrowth. Dressing procedure/placement/frequency: Wash buttocks, scrotum, coccyx, sacrum each shift with soap and water. Using a cotton tipped applicator put either [employer] Clear or Sween 24 into each of the very small opening at the coccyx/buttocks area. Then apply a heavy layer of Antifungal powder to all reddened skin.  Additionally, I have requested a standard size bed with low air loss mattress to help promote dryness to the already moist skin of the buttocks.  Monitor the wound area(s) for worsening of condition such as: Signs/symptoms of infection,  Increase in size,  Development of or worsening of odor, Development of pain, or increased pain at the affected locations.  Notify the medical team if any of these develop.  Thank you for the consult.  Discussed plan of care with the patient and bedside nurse.  [facility]ave nurse will not follow at this time.  Please re-consult the [facility] team if needed.  Val Riles, RN, MSN, CWOCN, CNS-BC, pager [phone]

## 2021-09-09 NOTE — Progress Notes (Signed)
Subjective: I seen and evaluated Mr. Bruun at bedside.  He was lying comfortably in bed.  He states they gave him an air mattress and it feels more comfortable.  He reports tolerating foods and liquids well.  He reports improvement in his odynophagia.  He reports minimal tenderness in his left residual limb.  Objective:  Vital signs in last 24 hours: Vitals:   09/09/21 0347 09/09/21 0735 09/09/21 1220 09/09/21 1343  BP: (!) 161/81 (!) 148/85 (!) 94/50 (!) 138/59  Pulse: 91 92 91   Resp: 20 20 20    Temp: 98.3 F (36.8 C) 98.9 F (37.2 C) 98 F (36.7 C)   TempSrc: Oral Oral Oral   SpO2: 96% 99% 98%   Weight: 82.2 kg     Height:       Physical Exam Constitutional:      General: He is awake. He is not in acute distress. HENT:     Head: Normocephalic and atraumatic.  Eyes:     General: Lids are normal.  Musculoskeletal:     Comments: Left knee-medial hematoma appears larger, fluid collection tracking up the medial side of the knee.  Warm to touch and non-tender.  Less erythema along incision site.     Right Lower Extremity: Right leg is amputated below knee.     Left Lower Extremity: Left leg is amputated below knee.  Skin:    General: Skin is warm and moist.  Neurological:     Mental Status: He is alert.  Psychiatric:        Behavior: Behavior is cooperative.     Assessment/Plan:  Principal Problem:   Septic arthritis of knee (HCC) Active Problems:   S/P BKA (below knee amputation) bilateral (HCC)   Acute kidney injury ([place])   Altered mental status   S/P debridement   MSSA (methicillin susceptible Staphylococcus aureus) infection   Cellulitis of left lower extremity   Pressure injury of skin   Duodenal ulcer   Gastritis and gastroduodenitis   Acute blood loss anemia   Dieulafoy lesion of duodenum  Septic arthritis of the left knee Ortho does not feel that further debridement would be helpful and recommends amputation.  Patient remains afebrile and WBC  WNL --Monitor for signs of systemic infection --Continue IV cefazolin 2 g every 6H  Moisture associated skin damage of left buttocks --Monitor for worsening condition --Wound care following   Hyponatremia Sodium level continue to improve; currently 132 (improved from previous day 130); patient is currently asymptomatic; continue to monitor.  Random urine sodium and urine osmolarity reassuring --Daily BMP  Hypertension Continue regimen, SBP <160 -- Amlodipine 10 mg daily --Losartan 100 mg daily --Toprol 100 mg daily --Hydralazine 25 mg 3 times daily   Type 2 diabetes A1c 5.8% on admission.  CBGs acceptable -- Semglee 15 units at bedtime --NovoLog 3 units with meals   Medication adjustment --Continue to taper off Klonopin  Upper GI bleed secondary to duodenal ulcer, resolved S/P procedure EGD on 09/05/21; hemoglobin stable at 9.3 today.  Last bowel movement overnight, no melenic stool. --Protonix 40 mg twice daily; continue for 6 weeks then reduce to 40 mg daily --Sucralfate -- Daily CBC     Discharge planning PT/OT recommending SNF. However patient resistant to SNF placement and would like to go home. Unable to go home due to need for IV antibiotic therapy. [facility] unable to accommodate patient at home for IV antibiotics. Pending placement [facility]. --Pending placement, appreciate CSW and [facility] team assistance with  placement      Prior to Admission Living Arrangement: Anticipated Discharge Location: Barriers to Discharge: Dispo: Anticipated discharge in approximately 1-2 day(s).   Timothy Lasso, MD 09/09/2021, 3:14 PM Pager: [phone] After 5pm on weekdays and 1pm on weekends: On Call pager [phone]

## 2021-09-09 NOTE — Progress Notes (Incomplete)
° °  Subjective: ***  Objective:  Vital signs in last 24 hours: Vitals:   09/08/21 2337 09/09/21 0347 09/09/21 0735 09/09/21 1220  BP: (!) 146/83 (!) 161/81 (!) 148/85 (!) 94/50  Pulse: 89 91 92 91  Resp: 16 20 20 20   Temp: 98.1 F (36.7 C) 98.3 F (36.8 C) 98.9 F (37.2 C) 98 F (36.7 C)  TempSrc: Oral Oral Oral Oral  SpO2: 100% 96% 99% 98%  Weight:  82.2 kg    Height:       ***  Assessment/Plan:  Principal Problem:   Septic arthritis of knee (HCC) Active Problems:   S/P BKA (below knee amputation) bilateral (HCC)   Acute kidney injury (HCC)   Altered mental status   S/P debridement   MSSA (methicillin susceptible Staphylococcus aureus) infection   Cellulitis of left lower extremity   Pressure injury of skin   Duodenal ulcer   Gastritis and gastroduodenitis   Acute blood loss anemia   Dieulafoy lesion of duodenum  Septic arthritis of the left knee Spoke with Ortho; they are planning to schedule I&D --Continue IV cefazolin 2 g every 6H   Upper GI bleed secondary to duodenal ulcer, resolved S/P procedure EGD on 09/05/21; hemoglobin remains stable ~9.   --Protonix 40 mg twice daily; continue for 6 weeks then reduce to 40 mg daily --Sucralfate -- Daily CBC   Intertrigo of the left buttock Overnight nurse noted a red, raised demarcated rash.  Patient reports soreness at the site of rash.  Patient denies pruritus, fever or chills. --Nystatin cream twice daily --Keep area clean and dry --Monitor for improvement  Hyponatremia Sodium level today at 132 (improved from yesterday 130); urine sodium >20; urine osmolality within normal limits. Patient is currently asymptomatic; continue to monitor  --Daily BMP  Hypertension -- Amlodipine 10 mg daily --Losartan 100 mg daily --Toprol 100 mg daily --Hydralazine 25 mg 3 times daily       Type 2 diabetes A1c 5.8% on admission.  CBGs acceptable -- Semglee 15 units at bedtime --NovoLog 3 units with meals    Medication adjustment --Continue to taper off Klonopin   Discharge planning PT/OT recommending SNF. However patient resistant to SNF placement and would like to go home. Unable to go home due to need for IV antibiotic therapy. [facility] unable to accommodate patient at home for IV antibiotics. Pending placement [facility]. --Pending placement, appreciate CSW and [facility] team assistance with placement   Prior to Admission Living Arrangement: Anticipated Discharge Location: Barriers to Discharge: Dispo: Anticipated discharge in approximately 1-2 day(s).   Timothy Lasso, MD 09/09/2021, 12:47 PM Pager: [phone] After 5pm on weekdays and 1pm on weekends: On Call pager [phone]

## 2021-09-09 NOTE — Progress Notes (Signed)
Nutrition Follow-up   DOCUMENTATION CODES:   Not applicable  INTERVENTION:   Continue Ensure Enlive po TID, each supplement provides 350 kcal and 20 grams of protein. Start vitamin A supplementation, 10,000 IU once daily x 2 weeks. Continue vitamin C and B6 supplementation. Continue MVI with minerals daily.  NUTRITION DIAGNOSIS:   Increased nutrient needs related to wound healing as evidenced by estimated needs.  Ongoing  GOAL:   Patient will meet greater than or equal to 90% of their needs  Progressing  MONITOR:   PO intake, Supplement acceptance, Labs, Skin  REASON FOR ASSESSMENT:   Consult Enteral/tube feeding initiation and management  ASSESSMENT:   75 yo male admitted with septic L knee, acute encephalopathy c/b AKI. PMH includes HTN, HLD, DM-2, COPD, bilateral BKAs, alcohol use disorder, PAD, hepatitis.  Patient remains on a full liquid diet.  Meal completions: 75% x 1 meal 11/2, no other documentation of intake for the past week.  Patient reports good intake of meals and Ensure supplements.  Drinking Ensure Enlive 2-3 times daily.  L residual limb worsening.  Possible AKA, but patient declined.  Labs reviewed. Na 132 CBG: 107-156  Vitamin Panel Vitamin B-6: 1.3 Low (receiving supplementation) Vitamin C: 0.1 Low (receiving supplementation) Vitamin A: 9.3 Low (starting supplementation today) Zinc: 54 WNL Folate: 6.9 WNL  Medications reviewed and include vitamin C 500 mg daily, Colace, Pepcid, folic acid, novolog, Semglee, Remeron, MVI with minerals, Protonix, vitamin B6 100 mg daily, Senokot, sucralfate, thiamine, IV antibiotics.  Admission weight 90.1 kg Current weight 82.2 kg  Diet Order:   Diet Order             Diet full liquid Room service appropriate? Yes with Assist; Fluid consistency: Thin  Diet effective now                   EDUCATION NEEDS:   Not appropriate for education at this time  Skin:  Skin Assessment: Skin Integrity  Issues: Skin Integrity Issues:: Stage II, Other (Comment) Stage II: sacrum Other: L leg wound at BKA site  Last BM:  11/12  Height:   Ht Readings from Last 1 Encounters:  09/05/21 5\' 6"  (1.676 m)    Weight:   Wt Readings from Last 1 Encounters:  09/09/21 82.2 kg    Estimated Nutritional Needs:   Kcal:  1900-2100  Protein:  105-125 gm  Fluid:  >/= 2.1 L    Lucas Mallow, RD, LDN, CNSC Please refer to Amion for contact information.

## 2021-09-10 DIAGNOSIS — L89322 Pressure ulcer of left buttock, stage 2: Secondary | ICD-10-CM

## 2021-09-10 LAB — BASIC METABOLIC PANEL
Anion gap: 9 (ref 5–15)
BUN: 23 mg/dL (ref 8–23)
CO2: 24 mmol/L (ref 22–32)
Calcium: 8 mg/dL — ABNORMAL LOW (ref 8.9–10.3)
Chloride: 99 mmol/L (ref 98–111)
Creatinine, Ser: 1.2 mg/dL (ref 0.61–1.24)
GFR, Estimated: >60 mL/min (ref >60)
Glucose, Bld: 114 mg/dL — ABNORMAL HIGH (ref 70–99)
Potassium: 4.1 mmol/L (ref 3.5–5.1)
Sodium: 132 mmol/L — ABNORMAL LOW (ref 135–145)

## 2021-09-10 LAB — CBC WITH DIFFERENTIAL/PLATELET
Abs Immature Granulocytes: 0.09 10*3/uL — ABNORMAL HIGH (ref 0.00–0.07)
Basophils Absolute: 0.1 10*3/uL (ref 0.0–0.1)
Basophils Relative: 1 %
Eosinophils Absolute: 0.2 10*3/uL (ref 0.0–0.5)
Eosinophils Relative: 2 %
HCT: 26.4 % — ABNORMAL LOW (ref 39.0–52.0)
Hemoglobin: 8.4 g/dL — ABNORMAL LOW (ref 13.0–17.0)
Immature Granulocytes: 1 %
Lymphocytes Relative: 22 %
Lymphs Abs: 2.3 10*3/uL (ref 0.7–4.0)
MCH: 28.6 pg (ref 26.0–34.0)
MCHC: 31.8 g/dL (ref 30.0–36.0)
MCV: 89.8 fL (ref 80.0–100.0)
Monocytes Absolute: 1.4 10*3/uL — ABNORMAL HIGH (ref 0.1–1.0)
Monocytes Relative: 13 %
Neutro Abs: 6.6 10*3/uL (ref 1.7–7.7)
Neutrophils Relative %: 61 %
Platelets: 359 10*3/uL (ref 150–400)
RBC: 2.94 MIL/uL — ABNORMAL LOW (ref 4.22–5.81)
RDW: 15.8 % — ABNORMAL HIGH (ref 11.5–15.5)
WBC: 10.7 10*3/uL — ABNORMAL HIGH (ref 4.0–10.5)
nRBC: 0 % (ref 0.0–0.2)

## 2021-09-10 LAB — GLUCOSE, CAPILLARY
Glucose-Capillary: 137 mg/dL — ABNORMAL HIGH (ref 70–99)
Glucose-Capillary: 192 mg/dL — ABNORMAL HIGH (ref 70–99)
Glucose-Capillary: 160 mg/dL — ABNORMAL HIGH (ref 70–99)
Glucose-Capillary: 162 mg/dL — ABNORMAL HIGH (ref 70–99)

## 2021-09-10 LAB — SURGICAL PCR SCREEN
MRSA, PCR: NEGATIVE
Staphylococcus aureus: NEGATIVE

## 2021-09-10 MED ORDER — TRANEXAMIC ACID 1000 MG/10ML IV SOLN
2000.0000 mg | INTRAVENOUS | Status: DC
  Filled 2021-09-10: qty 20

## 2021-09-10 NOTE — Progress Notes (Signed)
Patient ID: Leslie [name], male   DOB: [birth year], 75 y.o.   MRN: [id number] Patient is seen in follow-up for septic left knee with the fluid extending distally.  There is dermatitis and cellulitis of both the knee and tibia at the level of the below the knee amputation.  Again reviewed with the patient options and patient states he would like to proceed with an above-the-knee amputation.  We will plan for surgery tomorrow Wednesday.

## 2021-09-10 NOTE — H&P (View-Only) (Signed)
Patient ID: Leslie [name], male   DOB: 1945-12-04, 75 y.o.   MRN: [id number] Patient is seen in follow-up for septic left knee with the fluid extending distally.  There is dermatitis and cellulitis of both the knee and tibia at the level of the below the knee amputation.  Again reviewed with the patient options and patient states he would like to proceed with an above-the-knee amputation.  We will plan for surgery tomorrow Wednesday.

## 2021-09-10 NOTE — Progress Notes (Signed)
Subjective: I seen and evauluated Leslie [name] at bedside. He states he is feeling okay. Denies any complaints at this time, he expresses grieve regarding the decision for amputation (AKA of left). However, he agrees with the decision and understands it is what's best for his overall health.  Objective:  Vital signs in last 24 hours: Vitals:   09/09/21 2012 09/09/21 2347 09/10/21 0342 09/10/21 0735  BP: (!) 144/116 129/63 129/76 (!) 110/50  Pulse: 85 86 86 97  Resp: 20 16 18 18   Temp: 98.5 F (36.9 C) 98.2 F (36.8 C) 98.2 F (36.8 C) 98.3 F (36.8 C)  TempSrc: Oral Oral Oral Oral  SpO2: 98% 97% 100% 95%  Weight:   83 kg   Height:       Physical Exam Constitutional:      General: He is not in acute distress. HENT:     Head: Normocephalic and atraumatic.  Eyes:     General: Lids are normal.  Abdominal:     General: Abdomen is protuberant.  Genitourinary:    Comments: Left gluteal fold- stage II pressure ulcer and fissure noted. Protective bandage padding overlying wound. Musculoskeletal:     Comments: Left knee- large hematoma present on medial side of knee. Localized erythema to the hematoma. Knee joint warm to palpation compared to contralateral knee. Decreased redness noted around incision site.     Right Lower Extremity: Right leg is amputated below knee.     Left Lower Extremity: Left leg is amputated below knee.  Skin:    General: Skin is warm and dry.  Neurological:     General: No focal deficit present.     Mental Status: He is alert.  Psychiatric:        Mood and Affect: Affect is tearful.        Behavior: Behavior is cooperative.     Comments: Patient tearful about the decision to amputate (AKA of left).     Assessment/Plan:  Principal Problem:   Septic arthritis of knee (HCC) Active Problems:   S/P BKA (below knee amputation) bilateral (HCC)   Acute kidney injury ([place])   Altered mental status   S/P debridement   MSSA (methicillin susceptible  Staphylococcus aureus) infection   Cellulitis of left lower extremity   Pressure injury of skin   Duodenal ulcer   Gastritis and gastroduodenitis   Acute blood loss anemia   Dieulafoy lesion of duodenum  Septic arthritis of the left knee Patient has agreed to have the AKA procedure. WBC trending up, currently 10.7. However, patient remains afebrile.  --AKA scheduled tomorrow --NPO at midnight --Monitor for signs of systemic infection --Continue IV cefazolin 2 g every 6H   Moisture associated skin damage of left buttocks Sacral pressure ulcer Left buttock appears to be improving, less erythema. Protective bandage overlying left gluteal stage II pressure ulcer with adjacent fissure. --Monitor for worsening condition --Wound care following   Hyponatremia Sodium level continue to improve; currently 132; patient is currently asymptomatic; continue to monitor.   --Daily BMP   Hypertension Patient is normotensive; controlled. Continue current regimen -- Amlodipine 10 mg daily --Losartan 100 mg daily --Toprol 100 mg daily --Hydralazine 25 mg 3 times daily   Type 2 diabetes A1c 5.8% on admission.  CBGs acceptable -- Semglee 15 units at bedtime --NovoLog 3 units with meals   Medication adjustment --Continue to taper off Klonopin   Upper GI bleed secondary to duodenal ulcer, resolved S/P procedure EGD on 09/05/21; hemoglobin stable at  9.3 today.  Last bowel movement overnight, no melenic stool. --Protonix 40 mg twice daily; continue for 6 weeks then reduce to 40 mg daily --Sucralfate -- Daily CBC     Discharge planning PT/OT recommending SNF. However patient resistant to SNF placement and would like to go home. Unable to go home due to need for IV antibiotic therapy. [facility] unable to accommodate patient at home for IV antibiotics. Pending placement [facility]. --Pending placement, appreciate CSW and [facility] team assistance with placement      Prior to Admission Living  Arrangement: Anticipated Discharge Location: Barriers to Discharge: Dispo: Anticipated discharge in approximately 1-2 day(s).   Timothy Lasso, MD 09/10/2021, 10:09 AM Pager: [phone] After 5pm on weekdays and 1pm on weekends: On Call pager [phone]

## 2021-09-10 NOTE — Plan of Care (Signed)
  Problem: Clinical Measurements: Goal: Respiratory complications will improve Outcome: Progressing   Problem: Health Behavior/Discharge Planning: Goal: Ability to manage health-related needs will improve Outcome: Not Progressing   Problem: Clinical Measurements: Goal: Will remain free from infection Outcome: Not Progressing   

## 2021-09-10 NOTE — Progress Notes (Addendum)
Physical Therapy Treatment Patient Details Name: Leslie [name] MRN: [id number] DOB: [birth year] Today's Date: 09/10/2021   History of Present Illness Pt is a 75 y.o. male admitted 08/18/21 with AMS and L knee pain; workup for L knee sepsis. S/p L knee aspiration, then L knee I&D on 10/24 and again on 11/3. Course complicated by encephalopathy. MRI L femur 11/2 shows no osteomyelitis; L knee effusion with Baker's cyst. Pt with persistent L knee dermatitis and cellulitis; plan for L AKA on 11/16. PMH includes PAD s/p bilateral BKA (left 2017, right 2019), cellulitis, COPD, gout, arthritis, DM2.   PT Comments    Pt slowly progressing with mobility; continues to decline OOB mobility despite encouragement and education, reports, "You guys always catch me when I'm not feeling up for it." Pt mod indep with bed-level mobility with heavy reliance of bed rails; declines to don RLE prosthetic for transfer training, declines OOB mobility, reports, "I'll be able to do everything I need to do when I get home," but continues to request to stay on acute PT caseload. Pt remains limited by generalized weakness, decreased activity tolerance, and impaired balance strategies/postural reactions. Pt would surely benefit from SNF-level therapies s/p L AKA tomorrow, pending desire to participate while admitted. If not, may be more appropriate for LTC/ALF.      Recommendations for follow up therapy are one component of a multi-disciplinary discharge planning process, led by the attending physician.  Recommendations may be updated based on patient status, additional functional criteria and insurance authorization.  Follow Up Recommendations  Skilled nursing-short term rehab (<3 hours/day)     Assistance Recommended at Discharge Intermittent Supervision/Assistance  Equipment Recommendations  None recommended by PT    Recommendations for Other Services       Precautions / Restrictions Precautions Precautions:  Fall Precaution Comments: chronic bilateral BKA (has RLE prosthetic in room); significant h/o falls Restrictions Weight Bearing Restrictions: Yes LLE Weight Bearing: Non weight bearing     Mobility  Bed Mobility Overal bed mobility: Needs Assistance Bed Mobility: Rolling Rolling: Modified independent (Device/Increase time)         General bed mobility comments: Mod indep for multiple rolls R/L with use of bed rail; pt able to scoot himself up in bed with HOB elevated, reliant on bed rails    Transfers                   General transfer comment: pt declined transfer training or other OOB activity    Ambulation/Gait                   Stairs             Wheelchair Mobility    Modified Rankin (Stroke Patients Only)       Balance                                            Cognition Arousal/Alertness: Awake/alert Behavior During Therapy: Flat affect Overall Cognitive Status: No family/caregiver present to determine baseline cognitive functioning Area of Impairment: Safety/judgement;Awareness;Problem solving                         Safety/Judgement: Decreased awareness of safety;Decreased awareness of deficits Awareness: Emergent Problem Solving: Slow processing General Comments: pt with poor insight into deficits and skills needed to return home safely; continues to  state, "If I have my leg on at home, I'll be able to do anything i need to do" despite unwillingness to progress mobility this session. Pt also at times states he doesn't see the point in participating with PT ("since I can do it all"), but then requests to stay on acute PT caseload        Exercises      General Comments General comments (skin integrity, edema, etc.): Increased time discussing increased participation in OOB activity with acute PT/OT. Pt endorses goal is home in time for his [birth year], but later states, "They won't send me home" when  discussing d/c plan; increased time discussing all functional mobility pt will need to be mod indep with upon return home since pt does not have consistent support, to which pt continues to say, "I'll be able to do all that" even though he has not done so since admission. Educ pt that PT/OT at SNF rehab likely not a great option if he is not wanting to participate in OOB activity with acute PT/OT; pt reports, "You just keep catching me on a bad day..." and reports wanting to stay on acute PT caseload, even though he knows LLE will be painful post-L AKA tomorrow. Pt reports independent with performing exercises in bed. NT switched foley cath anchor to LLE to allow for donning of RLE prosthetic, but pt still declined to do so this session      Pertinent Vitals/Pain Pain Assessment: 0-10 Pain Score: 7  Pain Location: L knee Pain Descriptors / Indicators: Discomfort Pain Intervention(s): Monitored during session;Limited activity within patient's tolerance    Home Living                          Prior Function            PT Goals (current goals can now be found in the care plan section) Acute Rehab PT Goals Patient Stated Goal: "home in time for my birthday" PT Goal Formulation: With patient Time For Goal Achievement: 09/24/21 Potential to Achieve Goals: Fair Progress towards PT goals: Not progressing toward goals - comment (limited desire to participate, fatigue)    Frequency    Min 2X/week      PT Plan Discharge plan needs to be updated;Frequency needs to be updated    Co-evaluation              AM-PAC PT "6 Clicks" Mobility   Outcome Measure  Help needed turning from your back to your side while in a flat bed without using bedrails?: A Little Help needed moving from lying on your back to sitting on the side of a flat bed without using bedrails?: A Lot Help needed moving to and from a bed to a chair (including a wheelchair)?: A Lot Help needed standing up from  a chair using your arms (e.g., wheelchair or bedside chair)?: A Lot Help needed to walk in hospital room?: Total Help needed climbing 3-5 steps with a railing? : Total 6 Click Score: 11    End of Session   Activity Tolerance: Patient limited by fatigue Patient left: in bed;with call bell/phone within reach Nurse Communication: Mobility status PT Visit Diagnosis: Muscle weakness (generalized) (M62.81);Other abnormalities of gait and mobility (R26.89);Repeated falls (R29.6);Pain Pain - Right/Left: Left Pain - part of body: Knee     Time: 7858-8502 PT Time Calculation (min) (ACUTE ONLY): 19 min  Charges:  $Self Care/Home Management: 8-22  Mabeline Caras, PT, DPT Acute Rehabilitation Services  Pager [phone] Office Hand 09/10/2021, 12:34 PM

## 2021-09-11 ENCOUNTER — Inpatient Hospital Stay (HOSPITAL_COMMUNITY): Payer: Medicare (Managed Care) | Admitting: Anesthesiology

## 2021-09-11 ENCOUNTER — Encounter (HOSPITAL_COMMUNITY)
Admission: EM | Disposition: A | Discharge status: Skilled Nursing Facility | Payer: Self-pay | Source: Home / Self Care | Attending: Internal Medicine

## 2021-09-11 HISTORY — PX: AMPUTATION: SHX166

## 2021-09-11 LAB — GLUCOSE, CAPILLARY
Glucose-Capillary: 144 mg/dL — ABNORMAL HIGH (ref 70–99)
Glucose-Capillary: 116 mg/dL — ABNORMAL HIGH (ref 70–99)
Glucose-Capillary: 146 mg/dL — ABNORMAL HIGH (ref 70–99)
Glucose-Capillary: 164 mg/dL — ABNORMAL HIGH (ref 70–99)
Glucose-Capillary: 136 mg/dL — ABNORMAL HIGH (ref 70–99)

## 2021-09-11 LAB — BASIC METABOLIC PANEL
Anion gap: 7 (ref 5–15)
BUN: 22 mg/dL (ref 8–23)
CO2: 23 mmol/L (ref 22–32)
Calcium: 8.1 mg/dL — ABNORMAL LOW (ref 8.9–10.3)
Chloride: 101 mmol/L (ref 98–111)
Creatinine, Ser: 1.24 mg/dL (ref 0.61–1.24)
GFR, Estimated: >60 mL/min (ref >60)
Glucose, Bld: 145 mg/dL — ABNORMAL HIGH (ref 70–99)
Potassium: 3.9 mmol/L (ref 3.5–5.1)
Sodium: 131 mmol/L — ABNORMAL LOW (ref 135–145)

## 2021-09-11 LAB — CBC WITH DIFFERENTIAL/PLATELET
Abs Immature Granulocytes: 0.06 10*3/uL (ref 0.00–0.07)
Basophils Absolute: 0.1 10*3/uL (ref 0.0–0.1)
Basophils Relative: 1 %
Eosinophils Absolute: 0.1 10*3/uL (ref 0.0–0.5)
Eosinophils Relative: 1 %
HCT: 24.9 % — ABNORMAL LOW (ref 39.0–52.0)
Hemoglobin: 8 g/dL — ABNORMAL LOW (ref 13.0–17.0)
Immature Granulocytes: 1 %
Lymphocytes Relative: 23 %
Lymphs Abs: 2.3 10*3/uL (ref 0.7–4.0)
MCH: 28.6 pg (ref 26.0–34.0)
MCHC: 32.1 g/dL (ref 30.0–36.0)
MCV: 88.9 fL (ref 80.0–100.0)
Monocytes Absolute: 1.2 10*3/uL — ABNORMAL HIGH (ref 0.1–1.0)
Monocytes Relative: 12 %
Neutro Abs: 6 10*3/uL (ref 1.7–7.7)
Neutrophils Relative %: 62 %
Platelets: 327 10*3/uL (ref 150–400)
RBC: 2.8 MIL/uL — ABNORMAL LOW (ref 4.22–5.81)
RDW: 15.7 % — ABNORMAL HIGH (ref 11.5–15.5)
WBC: 9.7 10*3/uL (ref 4.0–10.5)
nRBC: 0 % (ref 0.0–0.2)

## 2021-09-11 SURGERY — AMPUTATION, ABOVE KNEE
Anesthesia: General | Site: Knee | Laterality: Left

## 2021-09-11 MED ORDER — GUAIFENESIN-DM 100-10 MG/5ML PO SYRP: 15.0000 mL | ORAL_SOLUTION | ORAL | Status: DC | PRN

## 2021-09-11 MED ORDER — LABETALOL HCL 5 MG/ML IV SOLN: 10.0000 mg | INTRAVENOUS | Status: DC | PRN

## 2021-09-11 MED ORDER — DOCUSATE SODIUM 100 MG PO CAPS: 100.0000 mg | ORAL_CAPSULE | Freq: Every day | ORAL | Status: DC

## 2021-09-11 MED ORDER — ONDANSETRON HCL 4 MG/2ML IJ SOLN
INTRAMUSCULAR | Status: AC
  Filled 2021-09-11: qty 2

## 2021-09-11 MED ORDER — OXYCODONE HCL 5 MG PO TABS
5.0000 mg | ORAL_TABLET | ORAL | Status: DC | PRN
  Administered 2021-09-13: 5 mg via ORAL
  Filled 2021-09-11: qty 2

## 2021-09-11 MED ORDER — JUVEN PO PACK
1.0000 | PACK | Freq: Two times a day (BID) | ORAL | Status: DC
  Administered 2021-09-11 – 2021-09-13 (×5): 1 via ORAL
  Filled 2021-09-11 (×3): qty 1

## 2021-09-11 MED ORDER — SUCCINYLCHOLINE CHLORIDE 200 MG/10ML IV SOSY
PREFILLED_SYRINGE | INTRAVENOUS | Status: DC | PRN
  Administered 2021-09-11: 160 mg via INTRAVENOUS

## 2021-09-11 MED ORDER — MAGNESIUM CITRATE PO SOLN
1.0000 | Freq: Once | ORAL | Status: DC | PRN
  Filled 2021-09-11: qty 296

## 2021-09-11 MED ORDER — PANTOPRAZOLE SODIUM 40 MG PO TBEC: 40.0000 mg | DELAYED_RELEASE_TABLET | Freq: Every day | ORAL | Status: DC

## 2021-09-11 MED ORDER — MAGNESIUM SULFATE 2 GM/50ML IV SOLN: 2.0000 g | Freq: Every day | INTRAVENOUS | Status: DC | PRN

## 2021-09-11 MED ORDER — HYDRALAZINE HCL 20 MG/ML IJ SOLN: 5.0000 mg | INTRAMUSCULAR | Status: DC | PRN

## 2021-09-11 MED ORDER — FENTANYL CITRATE (PF) 250 MCG/5ML IJ SOLN
INTRAMUSCULAR | Status: DC | PRN
  Administered 2021-09-11 (×2): 50 ug via INTRAVENOUS

## 2021-09-11 MED ORDER — CHLORHEXIDINE GLUCONATE 0.12 % MT SOLN: 15.0000 mL | Freq: Once | OROMUCOSAL | Status: AC

## 2021-09-11 MED ORDER — LACTATED RINGERS IV SOLN: INTRAVENOUS | Status: DC

## 2021-09-11 MED ORDER — OXYCODONE HCL 5 MG PO TABS
10.0000 mg | ORAL_TABLET | ORAL | Status: DC | PRN
  Administered 2021-09-11 – 2021-09-12 (×2): 15 mg via ORAL
  Administered 2021-09-12: 20:00:00 10 mg via ORAL
  Filled 2021-09-11 (×2): qty 3
  Filled 2021-09-11: qty 2

## 2021-09-11 MED ORDER — KETAMINE HCL 50 MG/5ML IJ SOSY
PREFILLED_SYRINGE | INTRAMUSCULAR | Status: AC
  Filled 2021-09-11: qty 5

## 2021-09-11 MED ORDER — FENTANYL CITRATE (PF) 250 MCG/5ML IJ SOLN
INTRAMUSCULAR | Status: AC
  Filled 2021-09-11: qty 5

## 2021-09-11 MED ORDER — TRANEXAMIC ACID-NACL 1000-0.7 MG/100ML-% IV SOLN: 1000.0000 mg | INTRAVENOUS | Status: DC

## 2021-09-11 MED ORDER — ALUM & MAG HYDROXIDE-SIMETH 200-200-20 MG/5ML PO SUSP: 15.0000 mL | ORAL | Status: DC | PRN

## 2021-09-11 MED ORDER — SUCCINYLCHOLINE CHLORIDE 200 MG/10ML IV SOSY
PREFILLED_SYRINGE | INTRAVENOUS | Status: AC
  Filled 2021-09-11: qty 10

## 2021-09-11 MED ORDER — PHENOL 1.4 % MT LIQD: 1.0000 | OROMUCOSAL | Status: DC | PRN

## 2021-09-11 MED ORDER — LIDOCAINE 2% (20 MG/ML) 5 ML SYRINGE
INTRAMUSCULAR | Status: AC
  Filled 2021-09-11: qty 5

## 2021-09-11 MED ORDER — ONDANSETRON HCL 4 MG/2ML IJ SOLN: 4.0000 mg | Freq: Four times a day (QID) | INTRAMUSCULAR | Status: DC | PRN

## 2021-09-11 MED ORDER — CEFAZOLIN SODIUM-DEXTROSE 2-4 GM/100ML-% IV SOLN
2.0000 g | Freq: Three times a day (TID) | INTRAVENOUS | Status: AC
  Administered 2021-09-11 (×2): 2 g via INTRAVENOUS
  Filled 2021-09-11: qty 100

## 2021-09-11 MED ORDER — KETAMINE HCL 50 MG/ML IJ SOLN
INTRAMUSCULAR | Status: DC | PRN
  Administered 2021-09-11: 30 mg via INTRAMUSCULAR

## 2021-09-11 MED ORDER — CEFAZOLIN SODIUM-DEXTROSE 2-4 GM/100ML-% IV SOLN: 2.0000 g | INTRAVENOUS | Status: DC

## 2021-09-11 MED ORDER — POLYETHYLENE GLYCOL 3350 17 G PO PACK: 17.0000 g | PACK | Freq: Every day | ORAL | Status: DC | PRN

## 2021-09-11 MED ORDER — ASCORBIC ACID 500 MG PO TABS
1000.0000 mg | ORAL_TABLET | Freq: Every day | ORAL | Status: DC
  Administered 2021-09-11 – 2021-09-13 (×3): 1000 mg via ORAL
  Filled 2021-09-11 (×2): qty 2

## 2021-09-11 MED ORDER — HYDROMORPHONE HCL 1 MG/ML IJ SOLN
0.5000 mg | INTRAMUSCULAR | Status: DC | PRN
  Administered 2021-09-11 – 2021-09-12 (×3): 1 mg via INTRAVENOUS
  Filled 2021-09-11 (×3): qty 1

## 2021-09-11 MED ORDER — LIDOCAINE 2% (20 MG/ML) 5 ML SYRINGE
INTRAMUSCULAR | Status: DC | PRN
  Administered 2021-09-11: 80 mg via INTRAVENOUS

## 2021-09-11 MED ORDER — AMISULPRIDE (ANTIEMETIC) 5 MG/2ML IV SOLN: 10.0000 mg | Freq: Once | INTRAVENOUS | Status: DC | PRN

## 2021-09-11 MED ORDER — TRANEXAMIC ACID-NACL 1000-0.7 MG/100ML-% IV SOLN
INTRAVENOUS | Status: AC
  Filled 2021-09-11: qty 100

## 2021-09-11 MED ORDER — PROPOFOL 10 MG/ML IV BOLUS
INTRAVENOUS | Status: AC
  Filled 2021-09-11: qty 20

## 2021-09-11 MED ORDER — ONDANSETRON HCL 4 MG/2ML IJ SOLN: 4.0000 mg | Freq: Once | INTRAMUSCULAR | Status: DC | PRN

## 2021-09-11 MED ORDER — OXYCODONE HCL 5 MG/5ML PO SOLN: 5.0000 mg | Freq: Once | ORAL | Status: DC | PRN

## 2021-09-11 MED ORDER — SODIUM CHLORIDE 0.9 % IV SOLN: INTRAVENOUS | Status: DC

## 2021-09-11 MED ORDER — BISACODYL 5 MG PO TBEC: 5.0000 mg | DELAYED_RELEASE_TABLET | Freq: Every day | ORAL | Status: DC | PRN

## 2021-09-11 MED ORDER — FENTANYL CITRATE (PF) 100 MCG/2ML IJ SOLN: 25.0000 ug | INTRAMUSCULAR | Status: DC | PRN

## 2021-09-11 MED ORDER — ACETAMINOPHEN 325 MG PO TABS: 325.0000 mg | ORAL_TABLET | Freq: Four times a day (QID) | ORAL | Status: DC | PRN

## 2021-09-11 MED ORDER — ORAL CARE MOUTH RINSE: 15.0000 mL | Freq: Once | OROMUCOSAL | Status: AC

## 2021-09-11 MED ORDER — METOPROLOL TARTRATE 5 MG/5ML IV SOLN: 2.0000 mg | INTRAVENOUS | Status: DC | PRN

## 2021-09-11 MED ORDER — PROPOFOL 10 MG/ML IV BOLUS
INTRAVENOUS | Status: DC | PRN
  Administered 2021-09-11: 150 mg via INTRAVENOUS

## 2021-09-11 MED ORDER — CHLORHEXIDINE GLUCONATE 0.12 % MT SOLN
OROMUCOSAL | Status: AC
  Administered 2021-09-11: 15 mL via OROMUCOSAL
  Filled 2021-09-11: qty 15

## 2021-09-11 MED ORDER — ACETAMINOPHEN 500 MG PO TABS
ORAL_TABLET | ORAL | Status: AC
  Administered 2021-09-11: 1000 mg via ORAL
  Filled 2021-09-11: qty 2

## 2021-09-11 MED ORDER — ACETAMINOPHEN 500 MG PO TABS: 1000.0000 mg | ORAL_TABLET | Freq: Once | ORAL | Status: AC

## 2021-09-11 MED ORDER — POTASSIUM CHLORIDE CRYS ER 20 MEQ PO TBCR: 20.0000 meq | EXTENDED_RELEASE_TABLET | Freq: Every day | ORAL | Status: DC | PRN

## 2021-09-11 MED ORDER — ONDANSETRON HCL 4 MG/2ML IJ SOLN
INTRAMUSCULAR | Status: DC | PRN
  Administered 2021-09-11: 4 mg via INTRAVENOUS

## 2021-09-11 MED ORDER — OXYCODONE HCL 5 MG PO TABS: 5.0000 mg | ORAL_TABLET | Freq: Once | ORAL | Status: DC | PRN

## 2021-09-11 MED ORDER — PHENYLEPHRINE 40 MCG/ML (10ML) SYRINGE FOR IV PUSH (FOR BLOOD PRESSURE SUPPORT)
PREFILLED_SYRINGE | INTRAVENOUS | Status: DC | PRN
  Administered 2021-09-11: 80 ug via INTRAVENOUS

## 2021-09-11 MED ORDER — ZINC SULFATE 220 (50 ZN) MG PO CAPS
220.0000 mg | ORAL_CAPSULE | Freq: Every day | ORAL | Status: DC
  Administered 2021-09-11 – 2021-09-13 (×3): 220 mg via ORAL
  Filled 2021-09-11 (×2): qty 1

## 2021-09-11 SURGICAL SUPPLY — 43 items
BAG COUNTER SPONGE SURGICOUNT (BAG) IMPLANT
BAG SPNG CNTER NS LX DISP (BAG)
BLADE SAW RECIP 87.9 MT (BLADE) ×2 IMPLANT
BLADE SURG 21 STRL SS (BLADE) ×2 IMPLANT
BNDG COHESIVE 6X5 TAN STRL LF (GAUZE/BANDAGES/DRESSINGS) ×2 IMPLANT
CANISTER WOUND CARE 500ML ATS (WOUND CARE) ×1 IMPLANT
COVER SURGICAL LIGHT HANDLE (MISCELLANEOUS) ×2 IMPLANT
CUFF TOURN SGL QUICK 34 (TOURNIQUET CUFF)
CUFF TRNQT CYL 34X4.125X (TOURNIQUET CUFF) IMPLANT
DRAPE DERMATAC (DRAPES) ×2 IMPLANT
DRAPE INCISE IOBAN 66X45 STRL (DRAPES) ×4 IMPLANT
DRAPE U-SHAPE 47X51 STRL (DRAPES) ×2 IMPLANT
DRESSING PREVENA PLUS CUSTOM (GAUZE/BANDAGES/DRESSINGS) ×1 IMPLANT
DRSG PREVENA PLUS CUSTOM (GAUZE/BANDAGES/DRESSINGS)
DURAPREP 26ML APPLICATOR (WOUND CARE) ×2 IMPLANT
ELECT REM PT RETURN 9FT ADLT (ELECTROSURGICAL) ×2
ELECTRODE REM PT RTRN 9FT ADLT (ELECTROSURGICAL) ×1 IMPLANT
GLOVE SURG ORTHO LTX SZ9 (GLOVE) ×2 IMPLANT
GLOVE SURG POLYISO LF SZ6.5 (GLOVE) ×2 IMPLANT
GLOVE SURG UNDER POLY LF SZ7.5 (GLOVE) ×2 IMPLANT
GLOVE SURG UNDER POLY LF SZ9 (GLOVE) ×2 IMPLANT
GOWN STRL REUS W/ TWL LRG LVL3 (GOWN DISPOSABLE) ×1 IMPLANT
GOWN STRL REUS W/ TWL XL LVL3 (GOWN DISPOSABLE) ×2 IMPLANT
GOWN STRL REUS W/TWL LRG LVL3 (GOWN DISPOSABLE) ×2
GOWN STRL REUS W/TWL XL LVL3 (GOWN DISPOSABLE) ×4
KIT BASIN OR (CUSTOM PROCEDURE TRAY) ×2 IMPLANT
KIT TURNOVER KIT B (KITS) ×2 IMPLANT
MANIFOLD NEPTUNE II (INSTRUMENTS) ×2 IMPLANT
NS IRRIG 1000ML POUR BTL (IV SOLUTION) ×2 IMPLANT
PACK ORTHO EXTREMITY (CUSTOM PROCEDURE TRAY) ×2 IMPLANT
PAD ARMBOARD 7.5X6 YLW CONV (MISCELLANEOUS) ×2 IMPLANT
PREVENA RESTOR ARTHOFORM 46X30 (CANNISTER) ×1 IMPLANT
STAPLER SKIN 35 WIDE (STAPLE) ×1 IMPLANT
STAPLER VISISTAT 35W (STAPLE) IMPLANT
STOCKINETTE IMPERVIOUS LG (DRAPES) IMPLANT
SUT ETHILON 2 0 PSLX (SUTURE) ×4 IMPLANT
SUT SILK 2 0 (SUTURE) ×2
SUT SILK 2-0 18XBRD TIE 12 (SUTURE) ×1 IMPLANT
SUT VIC AB 1 CTX 36 (SUTURE) ×4
SUT VIC AB 1 CTX36XBRD ANBCTRL (SUTURE) IMPLANT
TOWEL GREEN STERILE FF (TOWEL DISPOSABLE) ×2 IMPLANT
TUBE CONNECTING 20X1/4 (TUBING) ×2 IMPLANT
YANKAUER SUCT BULB TIP NO VENT (SUCTIONS) ×2 IMPLANT

## 2021-09-11 NOTE — Anesthesia Procedure Notes (Signed)
Procedure Name: Intubation Date/Time: 09/11/2021 10:43 AM Performed by: Kyung Rudd, CRNA Pre-anesthesia Checklist: Patient identified, Emergency Drugs available, Suction available and Patient being monitored Patient Re-evaluated:Patient Re-evaluated prior to induction Oxygen Delivery Method: Circle system utilized Preoxygenation: Pre-oxygenation with 100% oxygen Induction Type: IV induction, Rapid sequence and Cricoid Pressure applied Laryngoscope Size: Mac and 4 Grade View: Grade I Tube type: Oral Tube size: 7.5 mm Number of attempts: 1 Airway Equipment and Method: Stylet and Oral airway Placement Confirmation: ETT inserted through vocal cords under direct vision, positive ETCO2 and breath sounds checked- equal and bilateral Tube secured with: Tape Dental Injury: Teeth and Oropharynx as per pre-operative assessment

## 2021-09-11 NOTE — Transfer of Care (Signed)
Immediate Anesthesia Transfer of Care Note  Patient: YURIY CUI  Procedure(s) Performed: LEFT ABOVE KNEE AMPUTATION (Left: Knee)  Patient Location: PACU  Anesthesia Type:General  Level of Consciousness: awake and drowsy  Airway & Oxygen Therapy: Patient Spontanous Breathing and Patient connected to face mask oxygen  Post-op Assessment: Report given to RN and Post -op Vital signs reviewed and stable  Post vital signs: Reviewed and stable  Last Vitals:  Vitals Value Taken Time  BP    Temp    Pulse 85 09/11/21 1141  Resp 18 09/11/21 1141  SpO2 100 % 09/11/21 1141  Vitals shown include unvalidated device data.  Last Pain:  Vitals:   09/11/21 0838  TempSrc: Oral  PainSc:       Patients Stated Pain Goal: 5 (02/07/63 3837)  Complications: No notable events documented.

## 2021-09-11 NOTE — Anesthesia Preprocedure Evaluation (Signed)
Anesthesia Evaluation    Reviewed: Allergy & Precautions, Patient's Chart, lab work & pertinent test results, reviewed documented beta blocker date and time   Airway Mallampati: II  TM Distance: >3 FB Neck ROM: Full    Dental  (+) Dental Advisory Given, Edentulous Lower, Edentulous Upper   Pulmonary asthma , COPD,  COPD inhaler, former smoker,    Pulmonary exam normal breath sounds clear to auscultation       Cardiovascular Exercise Tolerance: Poor hypertension, Pt. on medications and Pt. on home beta blockers + Peripheral Vascular Disease  Normal cardiovascular exam Rhythm:Regular Rate:Normal  Sinus tachycardia ST & T wave abnormality, consider inferolateral ischemia Abnormal ECG Since last tracing rate faster Confirmed by Oswaldo Milian [id number]) on 08/25/2021 8:01:39 PM  08/20/21 ECHO: IMPRESSIONS Left ventricular ejection fraction, by estimation, is 60 to 65%. The left ventricle has normal function. The left ventricle has no regional wall motion abnormalities. There is mild concentric left ventricular hypertrophy. Left ventricular diastolic function could not be evaluated. 1. Right ventricular systolic function is normal. The right ventricular size is normal. Tricuspid regurgitation signal is inadequate for assessing PA pressure. 2. The mitral valve is degenerative. Trivial mitral valve regurgitation. Mild mitral stenosis. Moderate to severe mitral annular calcification. 3. The aortic valve is grossly normal. Aortic valve regurgitation is not visualized. No aortic stenosis is present. 4. The inferior vena cava is normal in size with greater than 50% respiratory variability, suggesting right atrial pressure of 3 mmHg.   Neuro/Psych PSYCHIATRIC DISORDERS Anxiety  Neuromuscular disease    GI/Hepatic Neg liver ROS, PUD, GERD  Medicated,  Endo/Other  diabetes, Type 2, Insulin Dependent, Oral Hypoglycemic  AgentsObesity   Renal/GU Renal disease (AKI)     Musculoskeletal  (+) Arthritis  ( septic left knee),   Abdominal   Peds  Hematology  (+) Blood dyscrasia, anemia ,   Anesthesia Other Findings   Reproductive/Obstetrics                             Anesthesia Physical  Anesthesia Plan  ASA: 4  Anesthesia Plan: General   Post-op Pain Management:    Induction: Intravenous and Rapid sequence  PONV Risk Score and Plan: 2 and Dexamethasone and Ondansetron  Airway Management Planned: Oral ETT  Additional Equipment:   Intra-op Plan:   Post-operative Plan: Extubation in OR  Informed Consent:     Dental advisory given  Plan Discussed with: CRNA  Anesthesia Plan Comments:         Anesthesia Quick Evaluation

## 2021-09-11 NOTE — Progress Notes (Addendum)
   Subjective: I seen and evaluated Mr. Orcutt at bedside. He just returned from surgery (AKA of the left). Hemovac in place.  He was A&Ox3. He denies any pain or discomfort at this time.  Patient reports appetite.  Objective:  Vital signs in last 24 hours: Vitals:   09/10/21 2329 09/11/21 0410 09/11/21 0832 09/11/21 0838  BP: 138/60 135/86  (!) 162/74  Pulse: 88 90 88 90  Resp: 18 16  16   Temp: 98.5 F (36.9 C) 97.9 F (36.6 C)  98.1 F (36.7 C)  TempSrc: Oral Oral  Oral  SpO2: 98% 93%  97%  Weight:  81 kg    Height:       Physical Exam Constitutional:      General: He is not in acute distress. HENT:     Head: Normocephalic and atraumatic.  Eyes:     General: Lids are normal.  Cardiovascular:     Rate and Rhythm: Normal rate and regular rhythm.     Heart sounds: Normal heart sounds.  Pulmonary:     Effort: Pulmonary effort is normal.     Breath sounds: Normal air entry.  Abdominal:     General: Abdomen is protuberant.  Musculoskeletal:     Comments: Hemovac present on the left residual limb     Right Lower Extremity: Right leg is amputated below knee.     Left Lower Extremity: Left leg is amputated above knee.  Skin:    General: Skin is warm and dry.  Neurological:     General: No focal deficit present.     Mental Status: He is alert.  Psychiatric:        Behavior: Behavior normal. Behavior is cooperative.     Assessment/Plan:  Principal Problem:   Septic arthritis of knee (HCC) Active Problems:   S/P BKA (below knee amputation) bilateral (HCC)   Acute kidney injury ([place])   Altered mental status   S/P debridement   MSSA (methicillin susceptible Staphylococcus aureus) infection   Cellulitis of left lower extremity   Pressure injury of skin   Duodenal ulcer   Gastritis and gastroduodenitis   Acute blood loss anemia   Dieulafoy lesion of duodenum   Septic arthritis of the left knee Patient with persistent infection of the left knee despite I and D *2.  Ortho recommended AKA as a definitive treatment. AKA of the left lower extremity performed today.  -Ortho recommends continuing abx for an additional 24 hours --Monitor for signs of systemic infection - Non weight bearing on the left per ortho - c/w wound vac * 1 week --Will likely need SNF on discharge per PT   Moisture associated skin damage of left buttocks Sacral pressure ulcer --Monitor for worsening condition --Wound care following   Hyponatremia Sodium level fluctuate ~131; patient is asymptomatic; continue to monitor.   --Daily BMP   Hypertension -- Amlodipine 10 mg daily --Losartan 100 mg daily --Toprol 100 mg daily --Hydralazine 25 mg 3 times daily   Type 2 diabetes A1c 5.8% on admission.  CBGs acceptable -- Semglee 15 units at bedtime --NovoLog 3 units with meals   Medication adjustment --Continue to taper off Klonopin   Prior to Admission Living Arrangement: Anticipated Discharge Location: Barriers to Discharge: Dispo: Anticipated discharge in approximately 1-2 day(s).   Timothy Lasso, MD 09/11/2021, 10:46 AM Pager: [phone] After 5pm on weekdays and 1pm on weekends: On Call pager [phone]

## 2021-09-11 NOTE — Progress Notes (Signed)
Inpatient Rehab Admissions Coordinator:   Postop amputation consult order received.  Note therapies recommending SNF and would not likely be able to get auth for CIR from [facility].  Will sign off at this time.   Shann Medal, PT, DPT Admissions Coordinator [phone] 09/11/21  2:33 PM

## 2021-09-11 NOTE — Progress Notes (Signed)
Blood bank called to notify for positive antibodies from type and screen. Dr. Elgie Congo and CRNA made aware.

## 2021-09-11 NOTE — Op Note (Signed)
09/11/2021  11:47 AM  PATIENT:  Leslie [name]    PRE-OPERATIVE DIAGNOSIS:  Septic Left Knee  POST-OPERATIVE DIAGNOSIS:  Same  PROCEDURE:  LEFT ABOVE KNEE AMPUTATION Application of new customizable Prevena wound VAC SURGEON:  Newt Minion, MD  PHYSICIAN ASSISTANT:None ANESTHESIA:   General  PREOPERATIVE INDICATIONS:  Leslie [name] is a  75 y.o. male with a diagnosis of Septic Left Knee who failed conservative measures and elected for surgical management.    The risks benefits and alternatives were discussed with the patient preoperatively including but not limited to the risks of infection, bleeding, nerve injury, cardiopulmonary complications, the need for revision surgery, among others, and the patient was willing to proceed.  OPERATIVE IMPLANTS: Praveena wound VAC  @ENCIMAGES @  OPERATIVE FINDINGS: Good suction fit.  Tissue margins clear no signs of infection.  OPERATIVE PROCEDURE: Patient was brought the operating room and underwent general anesthetic.  After adequate levels anesthesia were obtained patient's left lower extremity was prepped using DuraPrep draped into a sterile field a timeout was called.  A fishmouth incision was made just proximal to his previous incision.  The dissection was carried down extra-articular leg to avoid contamination from the joint.  The leg was wrapped out of the sterile field with impervious Ioban to prevent contamination with the surgical field.  Incision was carried down to the medial vascular bundles these were clamped and suture ligated with 2-0 silk.  The femur was amputated with a reciprocating saw.  The amputation was completed electrocardio was used for further hemostasis the wound margins were healthy and viable.  The deep and superficial fascial layers were closed with #1 Vicryl the skin was closed using 2-0 nylon and staples.  A Prevena customizable wound VAC was applied this had a good suction fit patient was extubated taken the PACU in  stable condition.   DISCHARGE PLANNING:  Antibiotic duration: Continue antibiotics for 24 hours  Weightbearing: Nonweightbearing on the left  Pain medication: Opioid pathway  Dressing care/ Wound VAC: Continue wound VAC for 1 week  Ambulatory devices: Transfer training  Discharge to: Anticipate discharge to skilled nursing.  Follow-up: In the office 1 week post operative.

## 2021-09-11 NOTE — Interval H&P Note (Signed)
History and Physical Interval Note:  09/11/2021 7:06 AM  Leslie [name]  has presented today for surgery, with the diagnosis of Septic Left Knee.  The various methods of treatment have been discussed with the patient and family. After consideration of risks, benefits and other options for treatment, the patient has consented to  Procedure(s): LEFT ABOVE KNEE AMPUTATION (Left) as a surgical intervention.  The patient's history has been reviewed, patient examined, no change in status, stable for surgery.  I have reviewed the patient's chart and labs.  Questions were answered to the patient's satisfaction.     Newt Minion

## 2021-09-11 NOTE — Progress Notes (Signed)
OT Cancellation Note  Patient Details Name: Leslie [name] MRN: [id number] DOB: 1946-10-26   Cancelled Treatment:    Reason Eval/Treat Not Completed: Patient at procedure or test/ unavailable (Pt in surgery.)  [name] 09/11/2021, 9:51 AM Nestor Lewandowsky, OTR/L Acute Rehabilitation Services Pager: [phone] Office: [phone]

## 2021-09-11 NOTE — Anesthesia Postprocedure Evaluation (Signed)
Anesthesia Post Note  Patient: Leslie [name]  Procedure(s) Performed: LEFT ABOVE KNEE AMPUTATION (Left: Knee)     Patient location during evaluation: PACU Anesthesia Type: General Level of consciousness: sedated Pain management: pain level controlled Vital Signs Assessment: post-procedure vital signs reviewed and stable Respiratory status: spontaneous breathing and respiratory function stable Cardiovascular status: stable Postop Assessment: no apparent nausea or vomiting Anesthetic complications: no   No notable events documented.  Last Vitals:  Vitals:   09/11/21 1155 09/11/21 1205  BP: (!) 122/55   Pulse: 85 83  Resp: 16 15  Temp:  (!) 36.1 C  SpO2: 99% 98%    Last Pain:  Vitals:   09/11/21 1205  TempSrc:   PainSc: 0-No pain                 Merlinda Frederick

## 2021-09-12 ENCOUNTER — Encounter (HOSPITAL_COMMUNITY): Payer: Self-pay | Admitting: Orthopedic Surgery

## 2021-09-12 LAB — GLUCOSE, CAPILLARY
Glucose-Capillary: 123 mg/dL — ABNORMAL HIGH (ref 70–99)
Glucose-Capillary: 173 mg/dL — ABNORMAL HIGH (ref 70–99)
Glucose-Capillary: 152 mg/dL — ABNORMAL HIGH (ref 70–99)
Glucose-Capillary: 145 mg/dL — ABNORMAL HIGH (ref 70–99)

## 2021-09-12 LAB — BASIC METABOLIC PANEL
Anion gap: 9 (ref 5–15)
BUN: 26 mg/dL — ABNORMAL HIGH (ref 8–23)
CO2: 21 mmol/L — ABNORMAL LOW (ref 22–32)
Calcium: 8 mg/dL — ABNORMAL LOW (ref 8.9–10.3)
Chloride: 98 mmol/L (ref 98–111)
Creatinine, Ser: 1.43 mg/dL — ABNORMAL HIGH (ref 0.61–1.24)
GFR, Estimated: 51 mL/min — ABNORMAL LOW (ref >60)
Glucose, Bld: 136 mg/dL — ABNORMAL HIGH (ref 70–99)
Potassium: 4.3 mmol/L (ref 3.5–5.1)
Sodium: 128 mmol/L — ABNORMAL LOW (ref 135–145)

## 2021-09-12 LAB — CBC
HCT: 22.5 % — ABNORMAL LOW (ref 39.0–52.0)
Hemoglobin: 7.3 g/dL — ABNORMAL LOW (ref 13.0–17.0)
MCH: 28.7 pg (ref 26.0–34.0)
MCHC: 32.4 g/dL (ref 30.0–36.0)
MCV: 88.6 fL (ref 80.0–100.0)
Platelets: 335 10*3/uL (ref 150–400)
RBC: 2.54 MIL/uL — ABNORMAL LOW (ref 4.22–5.81)
RDW: 15.9 % — ABNORMAL HIGH (ref 11.5–15.5)
WBC: 14.4 10*3/uL — ABNORMAL HIGH (ref 4.0–10.5)
nRBC: 0 % (ref 0.0–0.2)

## 2021-09-12 LAB — SARS CORONAVIRUS 2 (TAT 6-24 HRS): SARS Coronavirus 2: NEGATIVE

## 2021-09-12 LAB — SURGICAL PATHOLOGY

## 2021-09-12 NOTE — TOC Progression Note (Signed)
Transition of Care [facility]) - Progression Note    Patient Details  Name: Leslie [name] MRN: [id number] Date of Birth: [birth year]  Transition of Care [facility]) CM/SW [place], [place] Phone Number: 09/12/2021, 11:44 AM  Clinical Narrative:     CSW informed [facility] anticipated d/c tomorrow  CSW called [facility]- informed [facility] Duty patient will d/c tomorrow - she requested to contact [facility] transport once he is ready for d/c.  Attending aware and covid test requested  CSW will continue to follow and assist with discharge planning.  Thurmond Butts, MSW, LCSW Clinical Social Worker    Expected Discharge Plan: Skilled Nursing Facility Barriers to Discharge: Continued Medical Work up, SNF Pending bed offer  Expected Discharge Plan and Services Expected Discharge Plan: [place] arrangements for the past 2 months: Single Family Home                                       Social Determinants of Health (SDOH) Interventions    Readmission Risk Interventions No flowsheet data found.

## 2021-09-12 NOTE — Progress Notes (Signed)
Occupational Therapy Treatment Patient Details Name: Leslie [name] MRN: [id number] DOB: [birth year] Today's Date: 09/12/2021   History of present illness Pt is a 75 y.o. male admitted 08/18/21 with AMS and L knee pain; workup for L knee sepsis. S/p L knee aspiration, then L knee I&D on 10/24 and again on 11/3. Course complicated by encephalopathy. MRI L femur 11/2 shows no osteomyelitis; L knee effusion with Baker's cyst. 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 PMH includes PAD s/p bilateral BKA (left 2017, right 2019), cellulitis, COPD, gout, arthritis, DM2.   OT comments  Pt readily willing to work with therapies. Demonstrating poor sitting balance, but with support able to don R LE prosthesis with set up. Pt performed lateral scoot with mod assist, second person for safety. Moderate assistance to don gown prior to OOB and min assist for grooming once up in chair. Pt perseverative about confusion he experienced after surgery yesterday. Continue to recommend post acute rehab in SNF.    Recommendations for follow up therapy are one component of a multi-disciplinary discharge planning process, led by the attending physician.  Recommendations may be updated based on patient status, additional functional criteria and insurance authorization.    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), now s/p L AKA with wound vac; significant h/o falls Restrictions Weight Bearing Restrictions: Yes LLE Weight Bearing: Non weight bearing       Mobility Bed Mobility Overal bed mobility: Needs Assistance Bed Mobility:  Rolling;Supine to Sit Rolling: Modified independent (Device/Increase time)   Supine to sit: Mod assist;HOB elevated     General bed mobility comments: ModA for HHA to pull into sitting; use of bed rail for rolling    Transfers Overall transfer level: Needs assistance Equipment used: 1 person hand held assist Transfers: Bed to chair/wheelchair/BSC            Lateral/Scoot Transfers: Mod assist;+2 safety/equipment General transfer comment: ModA when scooting from bed to recliner towards R-side with R prosthetic donned; heavy reliance on HHA to pull hips over. Pt declined standing trial with RW     Balance Overall balance assessment: Needs assistance;History of Falls Sitting-balance support: No upper extremity supported;Feet unsupported Sitting balance-Leahy Scale: Poor Sitting balance - Comments: poor dynamic sitting, props with arms with static sitting at EOB                                   ADL either performed or assessed with clinical judgement   ADL Overall ADL's : Needs assistance/impaired     Grooming: Brushing hair;Set up;Oral care;Minimal assistance Grooming Details (indicate cue type and reason): cues to sequence toothbrushing         Upper Body Dressing : Moderate assistance;Bed level Upper Body Dressing Details (indicate cue type and reason): donned gown Lower Body Dressing: Minimal assistance;Sitting/lateral leans Lower Body Dressing Details (indicate cue type and reason): donned prosthesis with mod to max assist for sitting balance and set up                    Extremity/Trunk Assessment Upper Extremity Assessment  Upper Extremity Assessment: LUE deficits/detail LUE Deficits / Details: crushed styrofoam cup, decreased awareness of power of grip LUE Sensation: decreased proprioception LUE Coordination: decreased fine motor            Vision       Perception     Praxis      Cognition Arousal/Alertness:  Awake/alert Behavior During Therapy: Flat affect Overall Cognitive Status: No family/caregiver present to determine baseline cognitive functioning Area of Impairment: Safety/judgement;Awareness;Problem solving;Attention;Following commands                   Current Attention Level: Selective Memory: Decreased short-term memory Following Commands: Follows one step commands with increased time Safety/Judgement: Decreased awareness of safety;Decreased awareness of deficits Awareness: Emergent Problem Solving: Slow processing;Requires verbal cues General Comments: pt able to sequence donning prosthesis, encouragement to do it himself to prepare him for eventual return home          Exercises     Shoulder Instructions       General Comments Pt with little engagement in discussion regarding d/c plans, reports "I don't know"    Pertinent Vitals/ Pain       Pain Assessment: Faces Faces Pain Scale: Hurts little more Pain Location: L residual limb (notes pain improved from L knee pain pre-op) Pain Descriptors / Indicators: Discomfort;Guarding Pain Intervention(s): Monitored during session  Home Living                                          Prior Functioning/Environment              Frequency  Min 2X/week        Progress Toward Goals  OT Goals(current goals can now be found in the care plan section)  Progress towards OT goals: Progressing toward goals  Acute Rehab OT Goals OT Goal Formulation: With patient Time For Goal Achievement: 09/21/21 Potential to Achieve Goals: [place] Discharge plan remains appropriate    Co-evaluation    PT/OT/SLP Co-Evaluation/Treatment: Yes Reason for Co-Treatment: For patient/therapist safety   OT goals addressed during session: ADL's and self-care      AM-PAC OT "6 Clicks" Daily Activity     Outcome Measure   Help from another person eating meals?: A Little Help from another person taking care of  personal grooming?: A Little Help from another person toileting, which includes using toliet, bedpan, or urinal?: A Lot Help from another person bathing (including washing, rinsing, drying)?: A Lot Help from another person to put on and taking off regular upper body clothing?: A Little Help from another person to put on and taking off regular lower body clothing?: A Lot 6 Click Score: 15    End of Session    OT Visit Diagnosis: Other abnormalities of gait and mobility (R26.89);Muscle weakness (generalized) (M62.81);History of falling (Z91.81);Pain   Activity Tolerance Patient tolerated treatment well   Patient Left in chair;with call bell/phone within reach;with chair alarm set   Nurse Communication Mobility status        Time: 1610-9604 OT Time Calculation (min): 25 min  Charges: OT General Charges $OT Visit: 1 Visit OT Treatments $Self Care/Home Management : 8-22 mins  Nestor Lewandowsky, OTR/L Acute Rehabilitation Services Pager: [phone] Office: [phone]   [name] 09/12/2021, 11:50 AM

## 2021-09-12 NOTE — Progress Notes (Signed)
Subjective: I seen and evaluated Leslie [name] at bedside.  Leslie [name] was lying comfortably in bed.  Leslie [name] states Leslie [name] is not in any pain currently.  Leslie [name] denies any further complaints at this time.  Leslie [name] expressed concerns about pain medication and re-infection.  Leslie [name] was reassured by the medicine team that the source of infection has been controlled (AKA procedure) and pain medication on board as needed.  Leslie [name] acknowledges and agrees.  Objective:  Vital signs in last 24 hours: Vitals:   09/11/21 2024 09/11/21 2328 09/12/21 0337 09/12/21 0739  BP: (!) 147/69 (!) 124/43 140/69 (!) 115/54  Pulse: 82 88 100 96  Resp: 17 17 17 19   Temp: 98.5 F (36.9 C) 98.6 F (37 C) 98.1 F (36.7 C) 98.3 F (36.8 C)  TempSrc: Oral Oral Oral Oral  SpO2: 97% 100% 98% 97%  Weight:      Height:       Physical Exam Constitutional:      General: Leslie [name] is awake. Leslie [name] is not in acute distress. HENT:     Head: Normocephalic and atraumatic.  Eyes:     General: Lids are normal.  Cardiovascular:     Rate and Rhythm: Normal rate and regular rhythm.     Heart sounds: Normal heart sounds.  Pulmonary:     Effort: Pulmonary effort is normal.     Breath sounds: Normal breath sounds.  Abdominal:     General: Abdomen is protuberant. Bowel sounds are normal.  Musculoskeletal:     Comments: Hemovac present     Right Lower Extremity: Right leg is amputated below knee.     Left Lower Extremity: Left leg is amputated above knee.  Neurological:     Mental Status: Leslie [name] is alert.  Psychiatric:        Behavior: Behavior is cooperative.    Assessment/Plan:  Principal Problem:   Septic arthritis of knee (HCC) Active Problems:   S/P BKA (below knee amputation) bilateral (HCC)   Acute kidney injury ([place])   Altered mental status   S/P debridement   MSSA (methicillin susceptible Staphylococcus aureus) infection   Cellulitis of left lower extremity   Pressure injury of skin   Duodenal ulcer   Gastritis and gastroduodenitis   Acute blood  loss anemia   Dieulafoy lesion of duodenum  S/P POD1 of septic arthritis of the left knee Overnight patient vitals has been stable; remains afebrile.  Evaded white count at 14.4 likely reactive postop.  Slight decrease in hemoglobin from 8-7.3, likely in the setting of postop surgical blood loss.  We will continue to monitor hemoglobin.  Patient states moderate pain relief with current pain management.  Denies any pain today.  Antibiotics discontinued today per Ortho recommendation and ID agrees.  PT recommends SNF on discharge.  Continue with wound VAC for 1 week per Ortho recommendation. --Monitor for signs of systemic infection --Discontinue cefazolin -- Potential DC tomorrow; [facility] facility is ready to accept per CSW --COVID swab ordered today  Moisture associated skin damage of left buttocks Sacral pressure ulcer --Monitor for worsening condition --Wound care following   Hyponatremia Sodium level fluctuate ~131; patient is asymptomatic; continue to monitor.   --Daily BMP   Hypertension Blood pressure well controlled. -- Amlodipine 10 mg daily --Losartan 100 mg daily --Toprol 100 mg daily --Hydralazine 25 mg 3 times daily   Type 2 diabetes A1c 5.8% on admission.  CBGs acceptable -- Semglee 15 units at bedtime --NovoLog 3 units with meals   Medication adjustment --  Continue to taper off Klonopin  Prior to Admission Living Arrangement: Anticipated Discharge Location: Barriers to Discharge: Dispo: Anticipated discharge in approximately 1-2 day(s).   Timothy Lasso, MD 09/12/2021, 11:48 AM Pager: [phone] After 5pm on weekdays and 1pm on weekends: On Call pager [phone]

## 2021-09-12 NOTE — Progress Notes (Signed)
Physical Therapy Treatment Patient Details Name: Leslie [name] MRN: [id number] DOB: 09-07-1946 Today's Date: 09/12/2021   History of Present Illness Pt is a 75 y.o. male admitted 08/18/21 with AMS and L knee pain; workup for L knee sepsis. S/p L knee aspiration, then L knee I&D on 10/24 and again on 11/3. Course complicated by encephalopathy. MRI L femur 11/2 shows no osteomyelitis; L knee effusion with Baker's cyst. 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 PMH includes PAD s/p bilateral BKA (left 2017, right 2019), cellulitis, COPD, gout, arthritis, DM2.   PT Comments    Pt seen for additional session for assist with transfer back to bed; pt requiring max verbal cues and assist for transfer set-up and sequencing, heavy modA to complete lateral scoot transfer with RLE prosthetic donned. Pt reports increased fatigue and hip pain. Continue to recommend SNF-level therapies to maximize functional mobility and independence. Will follow acutely to address established goals.    Recommendations for follow up therapy are one component of a multi-disciplinary discharge planning process, led by the attending physician.  Recommendations may be updated based on patient status, additional functional criteria and insurance authorization.  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), now s/p L AKA with wound vac; significant h/o falls Restrictions Weight Bearing Restrictions: Yes LLE Weight Bearing: Non weight bearing     Mobility  Bed Mobility Overal bed mobility: Needs Assistance Bed Mobility: Rolling Rolling: Modified  independent (Device/Increase time)   Supine to sit: Mod assist;HOB elevated   Sit to sidelying: Min guard General bed mobility comments: Close min guard for sit to sidelying to prevent fall from EOB, pt able to reposition self and roll with heavy use of bed rail    Transfers Overall transfer level: Needs assistance Equipment used: 1 person hand held assist Transfers: Bed to chair/wheelchair/BSC            Lateral/Scoot Transfers: Mod assist;+2 safety/equipment General transfer comment: ModA for scooting from drop arm recliner to EOB towards R-side, reliant on BUE support, difficulty scooting hips completely onto bed relying on rolling back into bed; pt requiring max verbal cues and assist for transfer set-up and safety    Ambulation/Gait                   Stairs             Wheelchair Mobility    Modified Rankin (Stroke Patients Only)       Balance Overall balance assessment: Needs assistance;History of Falls Sitting-balance support: No upper extremity supported;Feet unsupported Sitting balance-Leahy Scale: Poor Sitting balance - Comments: Pt with difficulty scooting hips back on bed in order to keep balance, relying on going to sidelying to reposition and stay on bed                                    Cognition Arousal/Alertness: Awake/alert Behavior During Therapy: Flat affect Overall Cognitive Status: No family/caregiver present to determine baseline cognitive functioning Area of Impairment: Safety/judgement;Awareness;Problem solving;Attention;Following commands;Memory  Current Attention Level: Selective Memory: Decreased short-term memory Following Commands: Follows one step commands with increased time Safety/Judgement: Decreased awareness of safety;Decreased awareness of deficits Awareness: Emergent Problem Solving: Slow processing;Requires verbal cues General Comments: pt able to sequence donning  prosthesis, encouragement to do it himself to prepare him for eventual return home        Exercises      General Comments General comments (skin integrity, edema, etc.): Requires encouragement to doff prosthesis without assist, reliant on HOB elevated in order to reach RLE while in bed; assist to reach strap. R knee left resting in extension; pt wanting pillow under L residual limb, but educ on nee to encourage hip extension and prevent flexion contractures      Pertinent Vitals/Pain Pain Assessment: Faces Faces Pain Scale: Hurts even more Pain Location: Hips Pain Descriptors / Indicators: Discomfort;Guarding Pain Intervention(s): Limited activity within patient's tolerance;Monitored during session    Home Living                          Prior Function            PT Goals (current goals can now be found in the care plan section) Progress towards PT goals: Progressing toward goals    Frequency    Min 2X/week      PT Plan Current plan remains appropriate    Co-evaluation   Reason for Co-Treatment: For patient/therapist safety   OT goals addressed during session: ADL's and self-care      AM-PAC PT "6 Clicks" Mobility   Outcome Measure  Help needed turning from your back to your side while in a flat bed without using bedrails?: A Little Help needed moving from lying on your back to sitting on the side of a flat bed without using bedrails?: A Lot Help needed moving to and from a bed to a chair (including a wheelchair)?: A Lot Help needed standing up from a chair using your arms (e.g., wheelchair or bedside chair)?: Total Help needed to walk in hospital room?: Total Help needed climbing 3-5 steps with a railing? : Total 6 Click Score: 10    End of Session Equipment Utilized During Treatment: Gait belt Activity Tolerance: Patient limited by fatigue;Patient limited by pain Patient left: in bed;with call bell/phone within reach Nurse Communication:  Mobility status PT Visit Diagnosis: Muscle weakness (generalized) (M62.81);Other abnormalities of gait and mobility (R26.89);Repeated falls (R29.6);Pain     Time: 2563-8937 PT Time Calculation (min) (ACUTE ONLY): 14 min  Charges:  $Therapeutic Activity: 8-22 mins                     Mabeline Caras, PT, DPT Acute Rehabilitation Services  Pager [phone] Office [place] 09/12/2021, 12:51 PM

## 2021-09-12 NOTE — Progress Notes (Signed)
Physical Therapy Treatment Patient Details Name: Leslie [name] MRN: [id number] DOB: 07/19/1946 Today's Date: 09/12/2021   History of Present Illness Pt is a 75 y.o. male admitted 08/18/21 with AMS and L knee pain; workup for L knee sepsis. S/p L knee aspiration, then L knee I&D on 10/24 and again on 11/3. Course complicated by encephalopathy. MRI L femur 11/2 shows no osteomyelitis; L knee effusion with Baker's cyst. 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 PMH includes PAD s/p bilateral BKA (left 2017, right 2019), cellulitis, COPD, gout, arthritis, DM2.   PT Comments    Pt progressing with mobility, motivated to participate this session. Today's session focused on transfer training with RLE prosthetic donned; pt requiring modA for lateral scoot transfers, assist to maintain balance and don prosthetic. Pt remains limited by generalized weakness, decreased activity tolerance, poor balance strategies/postural reactions and decreased awareness. Continue to recommend SNF-level therapies to maximize functional mobility and independence prior to return home. If pt to return home, will require assist for wheelchair-level transfers and majority of ADL tasks.    Recommendations for follow up therapy are one component of a multi-disciplinary discharge planning process, led by the attending physician.  Recommendations may be updated based on patient status, additional functional criteria and insurance authorization.  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), now s/p L AKA with wound vac; significant h/o  falls Restrictions Weight Bearing Restrictions: Yes LLE Weight Bearing: Non weight bearing     Mobility  Bed Mobility Overal bed mobility: Needs Assistance Bed Mobility: Rolling;Supine to Sit Rolling: Modified independent (Device/Increase time)   Supine to sit: Mod assist;HOB elevated     General bed mobility comments: ModA for HHA to pull into sitting; use of bed rail for rolling    Transfers Overall transfer level: Needs assistance Equipment used: 1 person hand held assist Transfers: Bed to chair/wheelchair/BSC            Lateral/Scoot Transfers: Mod assist;+2 safety/equipment General transfer comment: ModA when scooting from bed to recliner towards R-side with R prosthetic donned; heavy reliance on HHA to pull hips over. Pt declined standing trial with RW    Ambulation/Gait                   Stairs             Wheelchair Mobility    Modified Rankin (Stroke Patients Only)       Balance Overall balance assessment: Needs assistance;History of Falls Sitting-balance support: No upper extremity supported;Feet unsupported Sitting balance-Leahy Scale: Fair Sitting balance - Comments: Can static sit EOB without UE support, although preference for holding bed rail; requires mod-maxA to prevent posterior LOB when donning RLE prosthetic at EOB                                    Cognition Arousal/Alertness: Awake/alert Behavior During Therapy: Flat affect Overall Cognitive Status: No family/caregiver present to determine baseline cognitive functioning Area of Impairment: Safety/judgement;Awareness;Problem solving;Attention;Following commands  Current Attention Level: Selective   Following Commands: Follows one step commands with increased time Safety/Judgement: Decreased awareness of safety;Decreased awareness of deficits Awareness: Emergent Problem Solving: Slow processing;Requires verbal cues General Comments:  pt with poor insight into deficits and skills needed to return home safely; continues to state, "I'll be able to do anything i need to do at home" despite majority of hospital admission spent in bed. Pt reports "I don't know" when engaged in discussion regarding d/c plans        Exercises      General Comments General comments (skin integrity, edema, etc.): Pt with little engagement in discussion regarding d/c plans, reports "I don't know"      Pertinent Vitals/Pain Pain Assessment: Faces Faces Pain Scale: Hurts little more Pain Location: L residual limb (notes pain improved from L knee pain pre-op) Pain Descriptors / Indicators: Discomfort;Guarding Pain Intervention(s): Monitored during session;Repositioned    Home Living                          Prior Function            PT Goals (current goals can now be found in the care plan section) Progress towards PT goals: Progressing toward goals    Frequency    Min 2X/week      PT Plan Current plan remains appropriate    Co-evaluation              AM-PAC PT "6 Clicks" Mobility   Outcome Measure  Help needed turning from your back to your side while in a flat bed without using bedrails?: A Little Help needed moving from lying on your back to sitting on the side of a flat bed without using bedrails?: A Lot Help needed moving to and from a bed to a chair (including a wheelchair)?: A Lot Help needed standing up from a chair using your arms (e.g., wheelchair or bedside chair)?: Total Help needed to walk in hospital room?: Total Help needed climbing 3-5 steps with a railing? : Total 6 Click Score: 10    End of Session Equipment Utilized During Treatment: Other (comment) (RLE prosthetic) Activity Tolerance: Patient tolerated treatment well Patient left: in chair;with call bell/phone within reach;with chair alarm set Nurse Communication: Mobility status PT Visit Diagnosis: Muscle weakness (generalized)  (M62.81);Other abnormalities of gait and mobility (R26.89);Repeated falls (R29.6);Pain     Time: 0920-0940 PT Time Calculation (min) (ACUTE ONLY): 20 min  Charges:  $Therapeutic Activity: 8-22 mins                     Mabeline Caras, PT, DPT Acute Rehabilitation Services  Pager [phone] Office [place] 09/12/2021, 11:04 AM

## 2021-09-13 LAB — CBC
HCT: 20.5 % — ABNORMAL LOW (ref 39.0–52.0)
HCT: 24.3 % — ABNORMAL LOW (ref 39.0–52.0)
Hemoglobin: 6.5 g/dL — CL (ref 13.0–17.0)
Hemoglobin: 7.7 g/dL — ABNORMAL LOW (ref 13.0–17.0)
MCH: 28.1 pg (ref 26.0–34.0)
MCH: 28.1 pg (ref 26.0–34.0)
MCHC: 31.7 g/dL (ref 30.0–36.0)
MCHC: 31.7 g/dL (ref 30.0–36.0)
MCV: 88.7 fL (ref 80.0–100.0)
MCV: 88.7 fL (ref 80.0–100.0)
Platelets: 330 10*3/uL (ref 150–400)
Platelets: 328 10*3/uL (ref 150–400)
RBC: 2.31 MIL/uL — ABNORMAL LOW (ref 4.22–5.81)
RBC: 2.74 MIL/uL — ABNORMAL LOW (ref 4.22–5.81)
RDW: 15.9 % — ABNORMAL HIGH (ref 11.5–15.5)
RDW: 15.2 % (ref 11.5–15.5)
WBC: 12.9 10*3/uL — ABNORMAL HIGH (ref 4.0–10.5)
WBC: 14.3 10*3/uL — ABNORMAL HIGH (ref 4.0–10.5)
nRBC: 0 % (ref 0.0–0.2)
nRBC: 0 % (ref 0.0–0.2)

## 2021-09-13 LAB — BASIC METABOLIC PANEL
Anion gap: 7 (ref 5–15)
BUN: 26 mg/dL — ABNORMAL HIGH (ref 8–23)
CO2: 24 mmol/L (ref 22–32)
Calcium: 8.1 mg/dL — ABNORMAL LOW (ref 8.9–10.3)
Chloride: 100 mmol/L (ref 98–111)
Creatinine, Ser: 1.21 mg/dL (ref 0.61–1.24)
GFR, Estimated: >60 mL/min (ref >60)
Glucose, Bld: 105 mg/dL — ABNORMAL HIGH (ref 70–99)
Potassium: 3.9 mmol/L (ref 3.5–5.1)
Sodium: 131 mmol/L — ABNORMAL LOW (ref 135–145)

## 2021-09-13 LAB — GLUCOSE, CAPILLARY
Glucose-Capillary: 103 mg/dL — ABNORMAL HIGH (ref 70–99)
Glucose-Capillary: 140 mg/dL — ABNORMAL HIGH (ref 70–99)
Glucose-Capillary: 163 mg/dL — ABNORMAL HIGH (ref 70–99)

## 2021-09-13 LAB — PREPARE RBC (CROSSMATCH)

## 2021-09-13 MED ORDER — ZINC SULFATE 220 (50 ZN) MG PO CAPS: 220.0000 mg | ORAL_CAPSULE | Freq: Every day | ORAL | 0 refills | Status: AC

## 2021-09-13 MED ORDER — VITAMIN A 3 MG (10000 UNIT) PO CAPS: 10000.0000 [IU] | ORAL_CAPSULE | Freq: Every day | ORAL | 0 refills | Status: AC

## 2021-09-13 MED ORDER — SODIUM CHLORIDE 0.9% IV SOLUTION: Freq: Once | INTRAVENOUS | Status: AC

## 2021-09-13 MED ORDER — ASCORBIC ACID 1000 MG PO TABS: 1000.0000 mg | ORAL_TABLET | Freq: Every day | ORAL | Status: AC

## 2021-09-13 MED ORDER — CARBAMAZEPINE 200 MG PO TABS: 200.0000 mg | ORAL_TABLET | Freq: Every day | ORAL | Status: DC

## 2021-09-13 MED ORDER — HYDRALAZINE HCL 25 MG PO TABS: 25.0000 mg | ORAL_TABLET | Freq: Three times a day (TID) | ORAL | Status: DC

## 2021-09-13 MED ORDER — OXYCODONE HCL 5 MG PO TABS: 5.0000 mg | ORAL_TABLET | ORAL | 0 refills | Status: AC | PRN

## 2021-09-13 MED ORDER — METOPROLOL SUCCINATE ER 100 MG PO TB24: 100.0000 mg | ORAL_TABLET | Freq: Every day | ORAL | 0 refills | Status: DC

## 2021-09-13 MED ORDER — SUCRALFATE 1 G PO TABS: 1.0000 g | ORAL_TABLET | Freq: Three times a day (TID) | ORAL | Status: AC

## 2021-09-13 MED ORDER — OXYCODONE HCL 10 MG PO TABS: 10.0000 mg | ORAL_TABLET | ORAL | 0 refills | Status: AC | PRN

## 2021-09-13 MED ORDER — FOLIC ACID 1 MG PO TABS: 1.0000 mg | ORAL_TABLET | Freq: Every day | ORAL | Status: AC

## 2021-09-13 NOTE — Progress Notes (Signed)
Instructed Pt. To lay flat for 30 min and not to remove dressing until 1600 09/14/2021. RN notified.

## 2021-09-13 NOTE — TOC Transition Note (Signed)
Transition of Care [facility]) - CM/SW Discharge Note   Patient Details  Name: Leslie [name] MRN: [id number] Date of Birth: 1945-11-08  [facility]) CM/SW Contact:  Vinie Sill, LCSW Phone Number: 09/13/2021, 4:07 PM   Clinical Narrative:     Patient will Discharge to: [facility] Discharge Date: 09/13/2021 Family Notified:[facility] SW Transport By: [facility] transport   Per MD patient is ready for discharge. RN, patient, and facility notified of discharge. Discharge Summary sent to facility. RN given number for [phone].   Clinical Social Worker signing off.  Thurmond Butts, MSW, LCSW Clinical Social Worker     Final next level of care: Skilled Nursing Facility Barriers to Discharge: Continued Medical Work up, SNF Pending bed offer   Patient Goals and CMS Choice        Discharge Placement                       Discharge Plan and Services                                     Social Determinants of Health (SDOH) Interventions     Readmission Risk Interventions No flowsheet data found.

## 2021-09-13 NOTE — Discharge Summary (Addendum)
Name: Leslie [name] MRN: [id number] DOB: 1946-01-15 75 y.o. PCP: [facility] [place]  Date of Admission: 08/18/2021  5:22 PM Date of Discharge: 09/13/21 Attending Physician: Angelica Pou, MD  Discharge Diagnosis: 1. 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 Upper GI bleed 2/2 duodenal ulcer 3. Asymptomatic Hyponatremia 4. Sacral pressure ulcer Discharge Medications: Allergies as of 09/13/2021       Reactions   Atorvastatin Other (See Comments)   Unknown reaction   Statins Other (See Comments)   Unknown reaction - on MAR   Flexeril [cyclobenzaprine] Other (See Comments)   Dry mouth and hallucinations        Medication List     STOP taking these medications    bisacodyl 10 MG suppository Commonly known as: DULCOLAX   clonazePAM 1 MG tablet Commonly known as: KLONOPIN   FLEET ENEMA RE   furosemide 20 MG tablet Commonly known as: LASIX   losartan-hydrochlorothiazide 100-25 MG tablet Commonly known as: HYZAAR   pentoxifylline 400 MG CR tablet Commonly known as: TRENTAL   polyethylene glycol 17 g packet Commonly known as: MIRALAX / GLYCOLAX   pregabalin 150 MG capsule Commonly known as: LYRICA   ranitidine 300 MG tablet Commonly known as: ZANTAC       TAKE these medications    acetaminophen 325 MG tablet Commonly known as: TYLENOL Take 2 tablets (650 mg total) by mouth every 6 (six) hours as needed for mild pain (or Fever >/= 101). What changed: when to take this   albuterol 108 (90 Base) MCG/ACT inhaler Commonly known as: VENTOLIN HFA Inhale 2 puffs into the lungs 4 (four) times daily as needed for wheezing or shortness of breath.   albuterol (2.5 MG/3ML) 0.083% nebulizer solution Commonly known as: PROVENTIL Take 2.5 mg by nebulization every 6 (six) hours as needed for wheezing or shortness of breath.   allopurinol 100 MG tablet Commonly known as: ZYLOPRIM Take 100 mg by mouth at bedtime. For gout    amLODipine 10 MG tablet Commonly known as: NORVASC Take 1 tablet (10 mg total) by mouth daily.   ascorbic acid 1000 MG tablet Commonly known as: VITAMIN C Take 1 tablet (1,000 mg total) by mouth daily. Start taking on: September 14, 2021   aspirin 81 MG chewable tablet Chew 81 mg by mouth daily.   calcium carbonate 750 MG chewable tablet Commonly known as: TUMS EX Chew 1 tablet by mouth 2 (two) times daily as needed for heartburn.   carbamazepine 200 MG tablet Commonly known as: TEGRETOL Take 1 tablet (200 mg total) by mouth daily at 12 noon. Start taking on: September 14, 2021 What changed:  how much to take when to take this additional instructions Another medication with the same name was removed. Continue taking this medication, and follow the directions you see here.   CertaVite Senior/Antioxidant Tabs Take 1 tablet by mouth daily.   DULoxetine 60 MG capsule Commonly known as: CYMBALTA Take 60 mg by mouth daily.   famotidine 20 MG tablet Commonly known as: PEPCID Take 20 mg by mouth at bedtime.   ferrous sulfate 325 (65 FE) MG tablet Take 325 mg by mouth every other day.   folic acid 1 MG tablet Commonly known as: FOLVITE Take 1 tablet (1 mg total) by mouth daily. Start taking on: September 14, 2021   hydrALAZINE 25 MG tablet Commonly known as: APRESOLINE Take 1 tablet (25 mg total) by mouth every 8 (eight) hours. What  changed:  medication strength how much to take when to take this   insulin glargine 100 UNIT/ML injection Commonly known as: LANTUS Inject 75 Units into the skin daily. What changed: Another medication with the same name was removed. Continue taking this medication, and follow the directions you see here.   LORazepam 0.5 MG tablet Commonly known as: ATIVAN Take 0.25 mg by mouth daily as needed for anxiety (x2 months).   losartan 100 MG tablet Commonly known as: COZAAR Take 100 mg by mouth daily.   Melatonin 10 MG Tabs Take 10 mg by  mouth at bedtime.   metFORMIN 850 MG tablet Commonly known as: GLUCOPHAGE Take 850 mg by mouth 2 (two) times daily with a meal. What changed: Another medication with the same name was removed. Continue taking this medication, and follow the directions you see here.   metoprolol 200 MG 24 hr tablet Commonly known as: TOPROL-XL Take 200 mg by mouth daily. Take with or immediately following a meal. What changed: Another medication with the same name was added. Make sure you understand how and when to take each.   metoprolol succinate 100 MG 24 hr tablet Commonly known as: TOPROL-XL Take 1 tablet (100 mg total) by mouth daily. Take with or immediately following a meal. Start taking on: September 14, 2021 What changed: You were already taking a medication with the same name, and this prescription was added. Make sure you understand how and when to take each.   Minerin Creme Crea Apply 1 application topically 2 (two) times daily. For dryness   mirtazapine 7.5 MG tablet Commonly known as: REMERON Take 7.5 mg by mouth at bedtime.   nitroGLYCERIN 0.4 MG SL tablet Commonly known as: NITROSTAT Place 0.4 mg under the tongue every 5 (five) minutes x 3 doses as needed for chest pain.   oxyCODONE 5 MG immediate release tablet Commonly known as: Oxy IR/ROXICODONE Take 1-2 tablets (5-10 mg total) by mouth every 4 (four) hours as needed for moderate pain (pain score 4-6).   Oxycodone HCl 10 MG Tabs Take 1-1.5 tablets (10-15 mg total) by mouth every 4 (four) hours as needed for severe pain (pain score 7-10).   Repatha Pushtronex System 420 MG/3.5ML Soct Generic drug: Evolocumab with Infusor Inject 420 mg into the skin every 30 (thirty) days.   senna-docusate 8.6-50 MG tablet Commonly known as: Senokot-S Take 2 tablets by mouth daily.   sucralfate 1 g tablet Commonly known as: CARAFATE Take 1 tablet (1 g total) by mouth 4 (four) times daily -  with meals and at bedtime.   traZODone 50 MG  tablet Commonly known as: DESYREL Take 50 mg by mouth at bedtime.   vitamin A 3 MG (10000 UNITS) capsule Take 1 capsule (10,000 Units total) by mouth daily for 10 days. Start taking on: September 14, 2021   vitamin B-12 1000 MCG tablet Commonly known as: CYANOCOBALAMIN Take 1,000 mcg by mouth daily.   Vitamin D-3 25 MCG (1000 UT) Caps Take 1,000 Units by mouth daily.   zinc sulfate 220 (50 Zn) MG capsule Take 1 capsule (220 mg total) by mouth daily for 12 days. Start taking on: September 14, 2021               Discharge Care Instructions  (From admission, onward)           Start     Ordered   09/13/21 0000  Discharge wound care:       Comments: Wash buttocks,  scrotum, coccyx, sacrum with soap and water.  Use a cotton tipped applicator but either Criticaid clear or Sween 24 into each of the very small openings at the coccyx/buttocks area.  Then apply a heavy layer of antifungal powder to all reddened skin.   09/13/21 1343            Disposition and follow-up:   [name] was discharged from [facility] in Stable condition.  At the hospital follow up visit please address:  1.  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 Patient noted to have worsening anemia post-op requiring 1 unit PRBC transfusion  Asymptomatic Hyponatremia- trend BMP; monitor for symptoms  Sacral pressure ulcer- change dressing daily; encourage activity  2.  Labs / imaging needed at time of follow-up: BMP, CBC  3.  Pending labs/ test needing follow-up: None  Follow-up Appointments:  Follow-up Information     Newt Minion, MD Follow up in 1 week(s).   Specialty: Orthopedic Surgery Contact information: [address] [place] [address] [place] by problem list: 1. 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   He was altered and hypoglycemic.  He was afebrile but noted to have leukocytosis of 27.1.  On physical exam, infectious etiology was suspected due to erythema, edema of the left residual limb.  With follow-up testing patient was found to have septic arthritis of the left knee.  Patient was started on IV cefazolin and was to continue antibiotic treatment duration for 6 weeks per infectious disease.  Patient underwent to I&D procedures during hospital course.  Left knee continued to worsen despite surgical intervention and IV antibiotics.  Left knee was noted to have progressive and worsening erythema, swelling, and tenderness to touch.  Orthopedic surgeon recommended AKA as curative measure.  AKA of the left knee performed 09/11/2021.  Surgery was successful, Hemovac placed and antibiotics was continued for 24 hours.  Patient remained afebrile and white count was within normal limits.  Upon discharge, further antibiotics were not indicated.  Patient's hemoglobin dropped to 6.5 postop, likely associated with surgical blood loss.  Patient was transfused 1 unit of blood. 2. Upper GI bleed 2/2 duodenal ulcer-throughout hospital course patient fluctuating hemoglobin 2/2 blood loss in stool; patient experienced melanotic stool daily. Hemoglobin dropped just below 7 on 3 separate occasions and each time patient was transfused 1 unit of blood. EGD findings: vascular bleed coming from duodenal bulb adjacent to duodenal ulcer.  Procedure EGD was performed on 09/06/2021 and the source of bleeding was clipped and epinephrine injection was used.  No further bleeding was noted following the procedure.  Patient did not experience any more melanotic stool. Prior to AKA patient received 2 units of blood prophylactically.  Otherwise, GI bleed resolved. 3. Asymptomatic Hyponatremia-patient was found to be hypovolemic hyponatremic.  Patient received continuous IV NS to correct. Once euvolemic, sodium levels returned to normal range.  Over the hospital course, patient sodium levels would fluctuate (~130-135). However, patient remained asymptomatic. 4. Sacral pressure ulcer- was noted to have stage II pressure ulcer at left gluteal fold. Wound being managed by wound care. NO signs of infection. Wound appears to be healing, patient denies any pain.  Discharge Exam:   BP 126/65 (BP Location: Left Arm)   Pulse 92  Temp 98.2 F (36.8 C) (Oral)   Resp 20   Ht 5\' 6"  (1.676 m)   Wt 81 kg   SpO2 98%   BMI 28.82 kg/m  Discharge exam: Physical Exam Constitutional:      General: He is awake. He is not in acute distress. HENT:     Head: Normocephalic and atraumatic.  Eyes:     General: Lids are normal.  Cardiovascular:     Rate and Rhythm: Normal rate and regular rhythm.     Heart sounds: Normal heart sounds.  Pulmonary:     Effort: Pulmonary effort is normal.     Breath sounds: Normal breath sounds.  Abdominal:     General: Abdomen is protuberant. Bowel sounds are normal.     Palpations: Abdomen is soft.  Genitourinary:    Comments: Foley present Musculoskeletal:     Comments: Hemovac present on left residual limb      Right Lower Extremity: Right leg is amputated below knee.     Left Lower Extremity: Left leg is amputated above knee.  Skin:    General: Skin is warm and dry.  Neurological:     General: No focal deficit present.     Mental Status: He is alert. Mental status is at baseline.  Psychiatric:        Behavior: Behavior normal. Behavior is cooperative.        Cognition and Memory: Cognition normal.     Pertinent Labs, Studies, and Procedures:   CBC Latest Ref Rng & Units 09/13/2021 09/13/2021 09/12/2021  WBC 4.0 - 10.5 K/uL 14.3(H) 12.9(H) 14.4(H)  Hemoglobin 13.0 - 17.0 g/dL 7.7(L) 6.5(LL) 7.3(L)  Hematocrit 39.0 - 52.0 % 24.3(L) 20.5(L) 22.5(L)  Platelets 150 - 400 K/uL 328 330 335     CMP Latest Ref Rng & Units 09/13/2021 09/12/2021 09/11/2021  Glucose 70 - 99 mg/dL 105(H) 136(H) 145(H)   BUN 8 - 23 mg/dL 26(H) 26(H) 22  Creatinine 0.61 - 1.24 mg/dL 1.21 1.43(H) 1.24  Sodium 135 - 145 mmol/L 131(L) 128(L) 131(L)  Potassium 3.5 - 5.1 mmol/L 3.9 4.3 3.9  Chloride 98 - 111 mmol/L 100 98 101  CO2 22 - 32 mmol/L 24 21(L) 23  Calcium 8.9 - 10.3 mg/dL 8.1(L) 8.0(L) 8.1(L)  Total Protein 6.5 - 8.1 g/dL - - -  Total Bilirubin 0.3 - 1.2 mg/dL - - -  Alkaline Phos 38 - 126 U/L - - -  AST 15 - 41 U/L - - -  ALT 0 - 44 U/L - - -   Procedural EGD  Findings: Mild inflammation characterized by congestion (edema) and erythema was found in the gastric antrum. There was luminal deformity of the antrum and pre-pyloric channel. Active bleeding blood was found in the duodenal bulb. This area was lavaged and source of bleeding localized to a vascular lesion in the duodenal bulb, which was separate from the cratered ulcer. 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 Coagulation for hemostasis using bipolar probe was performed with some continued bleeding. Three hemostatic clips were successfully placed (MR conditional) with cessation of bleeding. The area was then successfully injected with 3 mL of a 1:10,000 solution of epinephrine for hemostasis with appropriate mucosal blanching. One non-bleeding cratered duodenal ulcer with no stigmata of bleeding was found in the duodenal bulb. The lesion was 8 mm in largest dimension. There was a small speck of blood that easily lavaged off. No additional high grade stigmata of bleeding was seen at this location.  This ulcer was separate from the above bleeding site.  CTA ABDOMEN AND PELVIS WITHOUT AND WITH CONTRAST IMPRESSION: CT angiogram negative for acute gastrointestinal hemorrhage.   Multilevel atherosclerotic changes, including:   -aortic atherosclerosis.  Aortic Atherosclerosis (ICD10-I70.0).   -bilateral iliac arterial disease, with estimated 50% narrowing at the right CIA, estimated 50% narrowing or greater in the  left EIA, and bilateral hypogastric artery origin narrowing, as above.   -evidence of bilateral femoropopliteal disease, as above   Bilateral renal arterial disease.   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   Small right pleural effusion and trace left pleural effusion.   Calcified pleural plaques. Correlation with any history of prior specific exposure may be useful.   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  Discharge Instructions: Discharge Instructions     Call MD for:  difficulty breathing, headache or visual disturbances   Complete by: As directed    Call MD for:  extreme fatigue   Complete by: As directed    Call MD for:  hives   Complete by: As directed    Call MD for:  persistant dizziness or light-headedness   Complete by: As directed    Call MD for:  persistant nausea and vomiting   Complete by: As directed    Call MD for:  redness, tenderness, or signs of infection (pain, swelling, redness, odor or green/yellow discharge around incision site)   Complete by: As directed    Call MD for:  severe uncontrolled pain   Complete by: As directed    Call MD for:  temperature >100.4   Complete by: As directed    Diet - low sodium heart healthy   Complete by: As directed    Discharge wound care:   Complete by: As directed    Wash buttocks, scrotum, coccyx, sacrum with soap and water.  Use a cotton tipped applicator but either Criticaid clear or Sween 24 into each of the very small openings at the coccyx/buttocks area.  Then apply a heavy layer of antifungal powder to all reddened skin.   Increase activity slowly   Complete by: As directed        Signed: Aldine Contes, MD 09/13/2021, 5:32 PM   Pager: [phone]

## 2021-09-13 NOTE — Progress Notes (Signed)
Attempted to call report to [facility] at [phone].

## 2021-09-13 NOTE — Progress Notes (Signed)
Mobility Specialist: Progress Note   09/13/21 1803  Mobility  Activity Transferred:  Bed to chair  Level of Assistance Dependent, patient does less than 25%  Assistive Device  (Pad)  Mobility Out of bed to chair with meals  Mobility Response Tolerated well  Mobility performed by Mobility specialist  Bed Position Chair ((Wheelchair))  $Mobility charge 1 Mobility   Pt assisted to wheelchair per RN request. Pt assisted with posterior scoot using pad. RN present in the room.   [facility] [place] Mobility Specialist Mobility Specialist Phone #1: [phone] Mobility Specialist Phone #2: [phone]

## 2021-09-14 ENCOUNTER — Other Ambulatory Visit: Payer: Self-pay

## 2021-09-14 ENCOUNTER — Emergency Department (HOSPITAL_COMMUNITY)
Admission: EM | Admit: 2021-09-14 | Discharge: 2021-09-14 | Disposition: A | Discharge status: Home / Self Care | Payer: Medicare (Managed Care) | Attending: Emergency Medicine | Admitting: Emergency Medicine

## 2021-09-14 ENCOUNTER — Encounter (HOSPITAL_COMMUNITY): Payer: Self-pay

## 2021-09-14 DIAGNOSIS — F0390 Unspecified dementia without behavioral disturbance: Secondary | ICD-10-CM | POA: Diagnosis not present

## 2021-09-14 DIAGNOSIS — Y846 Urinary catheterization as the cause of abnormal reaction of the patient, or of later complication, without mention of misadventure at the time of the procedure: Secondary | ICD-10-CM | POA: Diagnosis not present

## 2021-09-14 DIAGNOSIS — Z79899 Other long term (current) drug therapy: Secondary | ICD-10-CM | POA: Diagnosis not present

## 2021-09-14 DIAGNOSIS — Z7982 Long term (current) use of aspirin: Secondary | ICD-10-CM | POA: Insufficient documentation

## 2021-09-14 DIAGNOSIS — Z794 Long term (current) use of insulin: Secondary | ICD-10-CM | POA: Insufficient documentation

## 2021-09-14 DIAGNOSIS — Z7984 Long term (current) use of oral hypoglycemic drugs: Secondary | ICD-10-CM | POA: Diagnosis not present

## 2021-09-14 DIAGNOSIS — J449 Chronic obstructive pulmonary disease, unspecified: Secondary | ICD-10-CM | POA: Insufficient documentation

## 2021-09-14 DIAGNOSIS — T839XXA Unspecified complication of genitourinary prosthetic device, implant and graft, initial encounter: Secondary | ICD-10-CM

## 2021-09-14 DIAGNOSIS — J45909 Unspecified asthma, uncomplicated: Secondary | ICD-10-CM | POA: Insufficient documentation

## 2021-09-14 DIAGNOSIS — T83091A Other mechanical complication of indwelling urethral catheter, initial encounter: Secondary | ICD-10-CM | POA: Diagnosis present

## 2021-09-14 DIAGNOSIS — I10 Essential (primary) hypertension: Secondary | ICD-10-CM | POA: Diagnosis not present

## 2021-09-14 DIAGNOSIS — E1140 Type 2 diabetes mellitus with diabetic neuropathy, unspecified: Secondary | ICD-10-CM | POA: Insufficient documentation

## 2021-09-14 DIAGNOSIS — S3021XA Contusion of penis, initial encounter: Secondary | ICD-10-CM | POA: Diagnosis not present

## 2021-09-14 DIAGNOSIS — Z87891 Personal history of nicotine dependence: Secondary | ICD-10-CM | POA: Insufficient documentation

## 2021-09-14 MED ORDER — LIDOCAINE HCL URETHRAL/MUCOSAL 2 % EX GEL
1.0000 "application " | Freq: Once | CUTANEOUS | Status: AC
  Administered 2021-09-14: 1 via TOPICAL
  Filled 2021-09-14: qty 11

## 2021-09-14 NOTE — ED Notes (Signed)
[facility] was called, No ETA

## 2021-09-14 NOTE — ED Provider Notes (Addendum)
I provided a substantive portion of the care of this patient.  I personally performed the entirety of the history, exam, and medical decision making for this encounter.    Patient seen by me along with physician assistant.  Patient sent in because he pulled Foley catheter out.  Patient recently discharged to nursing facility.  Was discharged with Foley catheter.  Going through the notes its not clear exactly why Foley catheter stayed in.  He certainly had 1 and at 1 point in time during hospitalization then removed.  There is no additional note saying why it had a go back in.  But it appears that it needed to be in.  So we will go ahead and replace that.  GU examination did show a little bit of trauma from the catheter being pulled out while the balloon was inflated.  Best we can tell patient pulled it out himself.   Fredia Sorrow, MD 09/14/21 1025    Fredia Sorrow, MD 09/14/21 1026

## 2021-09-14 NOTE — ED Triage Notes (Signed)
Pt BIB [facility] for eval after removing his foley catheter just PTA. Pt from [facility], staff reports he pull his own catheter out for reasons unknown to them. Staff also reports that he is tugging at his wound vac as well. EM reports oozing blood from tip of penis. No acute distress on arrival.

## 2021-09-14 NOTE — ED Notes (Signed)
Pt's catheter was irrigated per EDP verbal order & after 50 cc of Sterile water was used to flush the catheter he had clear yellow urine return, EDP updated.

## 2021-09-14 NOTE — Discharge Instructions (Signed)
You were seen in the emergency department this morning for evaluation of your urinary catheter.  Your catheter was replaced in the department today without complication.  You will need to call urology in the next few days to schedule an appointment for removal and further evaluation regarding catheter use.  I have given you a referral to a urologist in your discharge paperwork.   Reasons to return would include development of fevers or inability to void or any new development of worsening symptoms.

## 2021-09-14 NOTE — ED Provider Notes (Signed)
[facility] EMERGENCY DEPARTMENT Provider Note   CSN: [id number] Arrival date & time: 09/14/21  0932     History Chief Complaint  Patient presents with   Removed Urinary Catheter    Leslie [name] is a 75 y.o. male.  Patient with history of dementia presents today from his nursing facility after pulling out his foley catheter this morning. Of note, patient was admitted to the hospital on 10/23, discharged yesterday for septic arthritis status post AKA with wound vac, discharged with foley in place. According to EMS, nursing home staff states that patient pulled out his foley catheter just prior to EMS arrival. Nursing home staff also concerned that he was pulling at his wound vac. Patient with no complaints.    Level 5 Caveat---Dementia  The history is provided by the nursing home, the patient and the EMS personnel. No language interpreter was used.      Past Medical History:  Diagnosis Date   Anxiety    Panic attack - 10  years aago- ? panic    Asthma    Cellulitis of left foot 2016   Healed and recurred 01/2016   Cellulitis of right foot 06/30/2012   Chronic lower back pain 07/01/2012   COPD (chronic obstructive pulmonary disease) ([place])    Uses nebulizers at home. Former smoker, current chewing tobacco. 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   GERD (gastroesophageal reflux disease)    Gouty arthritis 07/01/2012   Not attacks in ~5 yrs (03/2016)   Hepatitis 1967   "in jail when I got it; dr said it was pretty bad; quaranteened X 30d"   Hyperlipidemia    Hypertension    Lung nodule, solitary 07/2015   Due for 1-yr recheck in 07/2017   Peripheral arterial disease ([place])    critical limb ischemia   Peripheral neuropathy 07/01/2012   Seasonal allergies    Skin growth 07/01/2012   right side of nares; "been on there 20 years"   Substance abuse ([place])    Type II diabetes mellitus ([place]) 2002   Diagnosed in 2002    Patient Active Problem List   Diagnosis Date Noted   Duodenal  ulcer    Gastritis and gastroduodenitis    Acute blood loss anemia    Dieulafoy lesion of duodenum    Pressure injury of skin 08/26/2021   Cellulitis of left lower extremity    Septic arthritis of knee ([place]) 08/21/2021   MSSA (methicillin susceptible Staphylococcus aureus) infection 08/21/2021   S/P debridement 08/19/2021   Altered mental status 08/18/2021   Bacteremia    Acute respiratory distress    Acute kidney injury ([place])    Sepsis ([name])    Symptomatic anemia    Severe protein-calorie malnutrition ([place])    Wound infection    Dehiscence of amputation stump ([place])    Osteomyelitis ([place]) 03/26/2018   Cellulitis of foot without toes, right 03/03/2018   History of transmetatarsal amputation of right foot ([place]) 02/19/2018   Amputated toe of right foot ([place]) 01/22/2018   Gangrene of right foot (H[facility])    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 ([place]) 01/14/2018   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 ([place]) 01/14/2018   S/P BKA (below knee amputation) bilateral ([place]) 04/11/2016   History of tobacco abuse 03/31/2016   Osteomyelitis of left foot ([place])    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   Septic arthritis of left foot ([place]) 03/27/2016   Solitary pulmonary nodule 08/14/2015  Precordial chest pain 08/13/2015   Peripheral arterial disease ([place]) 04/18/2015   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   Right anterior knee pain 11/19/2012   Toenail deformity 01/16/2012   Gout    Diabetic neuropathy ([place]) 03/31/2008   ALCOHOLISM 03/31/2008   Rosacea 03/31/2008   GERD 01/31/2008   COPD (chronic obstructive pulmonary disease) ([place]) 16/10/930   DM W/COMPLICATION NOS, TYPE II 05/08/2007   OBESITY 05/08/2007   HTN (hypertension) 05/08/2007   HERNIATED [place] 05/08/2007   HISTORY OF ASBESTOS EXPOSURE 05/08/2007   Chronic lower back pain on narcotics  06/16/2005   HLD (hyperlipidemia) 03/22/2002    Past Surgical  History:  Procedure Laterality Date   AMPUTATION Left 03/28/2016   Procedure: AMPUTATION left foot 4th and 5 th ray;  Surgeon: Newt Minion, MD;  Location: [place];  Service: Orthopedics;  Laterality: Left;   AMPUTATION Left 04/11/2016   Procedure: LEFT BELOW KNEE AMPUTATION;  Surgeon: Newt Minion, MD;  Location: [place];  Service: Orthopedics;  Laterality: Left;   AMPUTATION Right 01/22/2018   Procedure: RIGHT FOOT 3RD RAY AMPUTATION;  Surgeon: Newt Minion, MD;  Location: [place];  Service: Orthopedics;  Laterality: Right;   AMPUTATION Right 02/19/2018   Procedure: RIGHT TRANSMETATARSAL AMPUTATION;  Surgeon: Newt Minion, MD;  Location: [place];  Service: Orthopedics;  Laterality: Right;   AMPUTATION Right 03/28/2018   Procedure: RIGHT BELOW KNEE AMPUTATION;  Surgeon: Newt Minion, MD;  Location: [place];  Service: Orthopedics;  Laterality: Right;   AMPUTATION Left 09/11/2021   Procedure: LEFT ABOVE KNEE AMPUTATION;  Surgeon: Newt Minion, MD;  Location: [place];  Service: Orthopedics;  Laterality: Left;   BIOPSY  08/25/2021   Procedure: BIOPSY;  Surgeon: Daryel November, MD;  Location: [facility] ENDOSCOPY;  Service: Gastroenterology;;   ESOPHAGOGASTRODUODENOSCOPY Left 09/05/2021   Procedure: ESOPHAGOGASTRODUODENOSCOPY (EGD);  Surgeon: Lavena Bullion, DO;  Location: [facility] ENDOSCOPY;  Service: Gastroenterology;  Laterality: Left;   ESOPHAGOGASTRODUODENOSCOPY (EGD) WITH PROPOFOL N/A 08/25/2021   Procedure: ESOPHAGOGASTRODUODENOSCOPY (EGD) WITH PROPOFOL;  Surgeon: Daryel November, MD;  Location: [place];  Service: Gastroenterology;  Laterality: N/A;   HEMOSTASIS CLIP PLACEMENT  09/05/2021   Procedure: HEMOSTASIS CLIP PLACEMENT;  Surgeon: Lavena Bullion, DO;  Location: [place];  Service: Gastroenterology;;   HOT HEMOSTASIS  08/25/2021   Procedure: HOT HEMOSTASIS (ARGON PLASMA COAGULATION/BICAP);  Surgeon: Daryel November, MD;  Location[facility]COPY;  Service: Gastroenterology;;    HOT HEMOSTASIS N/A 09/05/2021   Procedure: HOT HEMOSTASIS (ARGON PLASMA COAGULATION/BICAP);  Surgeon: Lavena Bullion, DO;  Location: [facility] ENDOSCOPY;  Service: Gastroenterology;  Laterality: N/A;   I & D EXTREMITY Left 08/19/2021   Procedure: IRRIGATION AND DEBRIDEMENT LEFT KNEE;  Surgeon: Paralee Cancel, MD;  Location: [place];  Service: Orthopedics;  Laterality: Left;   I & D EXTREMITY Left 08/29/2021   Procedure: IRRIGATION AND DEBRIDEMENT Left Knee;  Surgeon: Paralee Cancel, MD;  Location: [place];  Service: Orthopedics;  Laterality: Left;   LEG AMPUTATION BELOW KNEE Left 04/11/2016   LOWER EXTREMITY ANGIOGRAM N/A 04/18/2013   Procedure: LOWER EXTREMITY ANGIOGRAM;  Surgeon: Sherren Mocha, MD;  Location: [facility] CATH LAB;  Service: Cardiovascular;  Laterality: N/A;   LOWER EXTREMITY ANGIOGRAM  08/13/2015   bilateral iliac    PERIPHERAL VASCULAR CATHETERIZATION Bilateral 08/13/2015   Procedure: Lower Extremity Angiography;  Surgeon: Lorretta Harp, MD;  Location: [place] CV LAB;  Service: Cardiovascular;  Laterality: Bilateral;   PERIPHERAL VASCULAR CATHETERIZATION N/A 08/13/2015   Procedure:  Abdominal Aortogram;  Surgeon: Lorretta Harp, MD;  Location: [place] CV LAB;  Service: Cardiovascular;  Laterality: N/A;   SCLEROTHERAPY  08/25/2021   Procedure: SCLEROTHERAPY;  Surgeon: Daryel November, MD;  Location: [facility] ENDOSCOPY;  Service: Gastroenterology;;   Clide Deutscher  09/05/2021   Procedure: Clide Deutscher;  Surgeon: Lavena Bullion, DO;  Location: [facility] ENDOSCOPY;  Service: Gastroenterology;;   TOE AMPUTATION Right 01/22/2018   3RD TOE RIGHT FOOT       Family History  Problem Relation Age of Onset   Diabetes Sister    Hypertension Mother     Social History   Tobacco Use   Smoking status: Former    Packs/day: 2.00    Years: 40.00    Pack years: 80.00    Types: Cigarettes    Quit date: 01/13/2002    Years since quitting: 19.6   Smokeless tobacco: Current    Types: Chew    Tobacco comments:    chews a little tobacco  Vaping Use   Vaping Use: Never used  Substance Use Topics   Alcohol use: Not Currently    Comment: 03/03/18: "Used to drink alot. No alcohol in a long time."   Drug use: No    Types: Marijuana    Comment: 03/27/2016 "last marijuana was maybe in 2013"    Home Medications Prior to Admission medications   Medication Sig Start Date End Date Taking? Authorizing Provider  acetaminophen (TYLENOL) 325 MG tablet Take 2 tablets (650 mg total) by mouth every 6 (six) hours as needed for mild pain (or Fever >/= 101). Patient taking differently: Take 650 mg by mouth every 8 (eight) hours as needed for mild pain (or Fever >/= 101). 03/30/18   Marcelyn Bruins, MD  albuterol (PROVENTIL HFA;VENTOLIN HFA) 108 (90 Base) MCG/ACT inhaler Inhale 2 puffs into the lungs 4 (four) times daily as needed for wheezing or shortness of breath.     [provider]  albuterol (PROVENTIL) (2.5 MG/3ML) 0.083% nebulizer solution Take 2.5 mg by nebulization every 6 (six) hours as needed for wheezing or shortness of breath.    [provider]  allopurinol (ZYLOPRIM) 100 MG tablet Take 100 mg by mouth at bedtime. For gout    [provider]  amLODipine (NORVASC) 10 MG tablet Take 1 tablet (10 mg total) by mouth daily. 08/09/12   Funches, Adriana Mccallum, MD  ascorbic acid (VITAMIN C) 1000 MG tablet Take 1 tablet (1,000 mg total) by mouth daily. 09/14/21   Gaylan Gerold, DO  aspirin 81 MG chewable tablet Chew 81 mg by mouth daily.    [provider]  calcium carbonate (TUMS EX) 750 MG chewable tablet Chew 1 tablet by mouth 2 (two) times daily as needed for heartburn.    [provider]  carbamazepine (TEGRETOL) 200 MG tablet Take 1 tablet (200 mg total) by mouth daily at 12 noon. 09/14/21   Gaylan Gerold, DO  Cholecalciferol (VITAMIN D-3) 1000 units CAPS Take 1,000 Units by mouth daily.    [provider]  DULoxetine (CYMBALTA) 60 MG capsule  Take 60 mg by mouth daily. Patient not taking: Reported on 08/22/2021    [provider]  Evolocumab with Infusor (Trigg) 420 MG/3.5ML SOCT Inject 420 mg into the skin every 30 (thirty) days.    [provider]  famotidine (PEPCID) 20 MG tablet Take 20 mg by mouth at bedtime.    [provider]  ferrous sulfate 325 (65 FE) MG tablet Take 325  mg by mouth every other day.    [provider]  folic acid (FOLVITE) 1 MG tablet Take 1 tablet (1 mg total) by mouth daily. 09/14/21   Gaylan Gerold, DO  hydrALAZINE (APRESOLINE) 25 MG tablet Take 1 tablet (25 mg total) by mouth every 8 (eight) hours. 09/13/21   Gaylan Gerold, DO  insulin glargine (LANTUS) 100 UNIT/ML injection Inject 75 Units into the skin daily.    [provider]  LORazepam (ATIVAN) 0.5 MG tablet Take 0.25 mg by mouth daily as needed for anxiety (x2 months). 07/30/21 10/22/21  [provider]  losartan (COZAAR) 100 MG tablet Take 100 mg by mouth daily. Patient not taking: No sig reported    [provider]  Melatonin 10 MG TABS Take 10 mg by mouth at bedtime.    [provider]  metFORMIN (GLUCOPHAGE) 850 MG tablet Take 850 mg by mouth 2 (two) times daily with a meal.    [provider]  metoprolol (TOPROL-XL) 200 MG 24 hr tablet Take 200 mg by mouth daily. Take with or immediately following a meal.    [provider]  metoprolol succinate (TOPROL-XL) 100 MG 24 hr tablet Take 1 tablet (100 mg total) by mouth daily. Take with or immediately following a meal. 09/14/21 10/14/21  Gaylan Gerold, DO  mirtazapine (REMERON) 7.5 MG tablet Take 7.5 mg by mouth at bedtime. Patient not taking: Reported on 08/22/2021    [provider]  Multiple Vitamins-Minerals (CERTAVITE SENIOR/ANTIOXIDANT) TABS Take 1 tablet by mouth daily.    [provider]  nitroGLYCERIN (NITROSTAT) 0.4 MG SL tablet Place 0.4 mg under the tongue every 5  (five) minutes x 3 doses as needed for chest pain.  11/12/11   Funches, Adriana Mccallum, MD  oxyCODONE (OXY IR/ROXICODONE) 5 MG immediate release tablet Take 1-2 tablets (5-10 mg total) by mouth every 4 (four) hours as needed for moderate pain (pain score 4-6). 09/13/21   Timothy Lasso, MD  oxyCODONE 10 MG TABS Take 1-1.5 tablets (10-15 mg total) by mouth every 4 (four) hours as needed for severe pain (pain score 7-10). 09/13/21   Timothy Lasso, MD  senna-docusate (SENOKOT-S) 8.6-50 MG tablet Take 2 tablets by mouth daily.    [provider]  Skin Protectants, Misc. (MINERIN) CREA Apply 1 application topically 2 (two) times daily. For dryness    [provider]  sucralfate (CARAFATE) 1 g tablet Take 1 tablet (1 g total) by mouth 4 (four) times daily -  with meals and at bedtime. 09/13/21   Gaylan Gerold, DO  traZODone (DESYREL) 50 MG tablet Take 50 mg by mouth at bedtime.    [provider]  vitamin A 3 MG (10000 UNITS) capsule Take 1 capsule (10,000 Units total) by mouth daily for 10 days. 09/14/21 09/24/21  Gaylan Gerold, DO  vitamin B-12 (CYANOCOBALAMIN) 1000 MCG tablet Take 1,000 mcg by mouth daily.    [provider]  zinc sulfate 220 (50 Zn) MG capsule Take 1 capsule (220 mg total) by mouth daily for 12 days. 09/14/21 09/26/21  Gaylan Gerold, DO    Allergies    Atorvastatin, Statins, and Flexeril [cyclobenzaprine]  Review of Systems   Review of Systems  Unable to perform ROS: Dementia   Physical Exam Updated Vital Signs BP (!) 110/58 (BP Location: Left Arm)   Pulse 91   Temp 98.3 F (36.8 C) (Oral)   Resp 18   Ht 5\' 6"  (1.676 m)   Wt 81 kg  SpO2 98%   BMI 28.82 kg/m   Physical Exam Vitals and nursing note reviewed. Exam conducted with a chaperone present.  Constitutional:      General: He is not in acute distress.    Appearance: Normal appearance. He is normal weight. He is not ill-appearing, toxic-appearing or diaphoretic.  HENT:     Head:  Normocephalic and atraumatic.  Eyes:     Extraocular Movements: Extraocular movements intact.     Pupils: Pupils are equal, round, and reactive to light.  Cardiovascular:     Rate and Rhythm: Normal rate and regular rhythm.     Heart sounds: Normal heart sounds.  Pulmonary:     Effort: Pulmonary effort is normal.     Breath sounds: Normal breath sounds.  Abdominal:     General: Abdomen is flat. Bowel sounds are normal.     Palpations: Abdomen is soft.  Genitourinary:    Comments: Penis with some bruising and minimal amount of blood present, bleeding controlled at this time. No deformity present. Musculoskeletal:        General: Normal range of motion.     Cervical back: Normal range of motion.     Comments: Right sided BKA  Left sided AKA with wound vac which appears undisturbed and in place  Skin:    General: Skin is warm and dry.  Neurological:     Mental Status: He is alert. Mental status is at baseline.  Psychiatric:        Mood and Affect: Mood normal.        Behavior: Behavior normal.    ED Results / Procedures / Treatments   Labs (all labs ordered are listed, but only abnormal results are displayed) Labs Reviewed - No data to display  EKG None  Radiology No results found.  Procedures Procedures   Medications Ordered in ED Medications - No data to display  ED Course  I have reviewed the triage vital signs and the nursing notes.  Pertinent labs & imaging results that were available during my care of the patient were reviewed by me and considered in my medical decision making (see chart for details).    MDM Rules/Calculators/A&P                         Patient presents today following discharge from the hospital yesterday after extended admission for left knee septic arthritis with left AKA. He presents from his nursing facility after pulling out his foley catheter PTA. Upon extensive chart review, unsure why patient was discharged with foley in place  yesterday, unable to get collateral from patient due to his dementia. Some bruising noted to his penis, minimal bleeding present, patient is hemodynamically stable,. He is afebrile, non-toxic appearing, and in no acute distress. Plan to replace the foley today due to traumatic removal and give urology follow-up for reevaluation and removal.  Foley placed without resistance or difficulty. Urology consult placed. Detailed instructions given in discharge paperwork for further foley care and giving urology appointment. Patient discharged back to facility in stable condition.   This is a shared visit with supervising physician Dr. Rogene Houston who has independently evaluated patient & provided guidance in evaluation/management/disposition, in agreement with care    Final Clinical Impression(s) / ED Diagnoses Final diagnoses:  Problem with Foley catheter, initial encounter [facility])    Rx / DC Orders ED Discharge Orders     None     An After Visit Summary was printed  and given to the patient.    Nestor Lewandowsky 09/14/21 1109    Mesner, Corene Cornea, MD 09/15/21 [phone]

## 2021-09-14 NOTE — ED Notes (Signed)
Pt's urinary catheter bag has had no urine collected in it since insertion, EDP Zackowski made aware.

## 2021-09-15 LAB — TYPE AND SCREEN
ABO/RH(D): O NEG
Antibody Screen: POSITIVE
DAT, IgG: NEGATIVE
Unit division: 0
Unit division: 0

## 2021-09-15 LAB — BPAM RBC
Blood Product Expiration Date: 202212132359
Blood Product Expiration Date: 202212142359
ISSUE DATE / TIME: 202211180810
Unit Type and Rh: 5100
Unit Type and Rh: 5100

## 2021-09-26 ENCOUNTER — Ambulatory Visit (INDEPENDENT_AMBULATORY_CARE_PROVIDER_SITE_OTHER): Payer: Medicare (Managed Care) | Admitting: Orthopedic Surgery

## 2021-09-26 ENCOUNTER — Other Ambulatory Visit: Payer: Self-pay

## 2021-09-26 ENCOUNTER — Encounter: Payer: Self-pay | Admitting: Orthopedic Surgery

## 2021-09-26 DIAGNOSIS — S78112A Complete traumatic amputation at level between left hip and knee, initial encounter: Secondary | ICD-10-CM

## 2021-09-26 DIAGNOSIS — Z89612 Acquired absence of left leg above knee: Secondary | ICD-10-CM

## 2021-09-26 NOTE — Progress Notes (Addendum)
Office Visit Note   Patient: Leslie [name]           Date of Birth: 1946/08/12           MRN: [id number] Visit Date: 09/26/2021              Requested by: [facility], [place],  [place] [id number] PCP: [facility], [place]  Chief Complaint  Patient presents with   Left Leg - Routine Post Op    Left AKA 09/11/21 He doesn't have a shrinker today Wound vac taken off at SNF around 09/18/21      HPI: Patient is 2 weeks status post left above-the-knee amputation.  Patient is currently at skilled nursing.  Assessment & Plan: Visit Diagnoses:  1. Unilateral AKA, left ([place])     Plan: Patient wishes to discharge to home I feel he is safe from orthopedic standpoint for discharge to home.  Patient is to wash the leg with soap and water keep it dry dressing in place.  Patient has follow-up with biotech for prosthetic fitting on the right.  Patient was given a prescription for biotech for a shrinker for the left AKA as well as for a left AKA prosthesis K2.  Follow-Up Instructions: Return in about 4 weeks (around 10/24/2021).   Ortho Exam  Patient is alert, oriented, no adenopathy, well-dressed, normal affect, normal respiratory effort. Examination the incision is well-healed there is no redness no cellulitis no tenderness to palpation there is no swelling.  We will harvest the sutures and staples today.  Patient is a new left transfemoral amputee.  Patient's current comorbidities are not expected to impact the ability to function with the prescribed prosthesis. Patient verbally communicates a strong desire to use a prosthesis. Patient currently requires mobility aids to ambulate without a prosthesis.  Expects not to use mobility aids with a new prosthesis.  Patient is a K2 level ambulator that will use a prosthesis to walk around their home and the community over low level environmental barriers.       Imaging: No results found.   Labs: Lab Results  Component Value Date   HGBA1C 5.8 (H) 08/19/2021   HGBA1C 7.1 (H) 02/19/2018   HGBA1C 7.2 (H) 03/27/2016   ESRSEDRATE 90 (H) 03/27/2016   CRP 23.4 (H) 03/27/2016   LABURIC 4.8 08/09/2012   REPTSTATUS 09/03/2021 FINAL 08/29/2021   GRAMSTAIN  08/29/2021    NO SQUAMOUS EPITHELIAL CELLS SEEN ABUNDANT WBC SEEN FEW GRAM POSITIVE COCCI    CULT  08/29/2021    RARE STAPHYLOCOCCUS AUREUS NO ANAEROBES ISOLATED Performed at [facility], [place] [address], [place], [place] [address]    LABORGA STAPHYLOCOCCUS AUREUS 08/29/2021     Lab Results  Component Value Date   ALBUMIN 1.8 (L) 09/07/2021   ALBUMIN 1.9 (L) 09/06/2021   ALBUMIN 1.6 (L) 09/05/2021   PREALBUMIN 13.8 (L) 03/27/2016    Lab Results  Component Value Date   MG 2.2 08/25/2021   MG 1.5 (L) 08/24/2021   No results found for: [facility]  Lab Results  Component Value Date   PREALBUMIN 13.8 (L) 03/27/2016   CBC EXTENDED Latest Ref Rng & Units 09/13/2021 09/13/2021 09/12/2021  WBC 4.0 - 10.5 K/uL 14.3(H) 12.9(H) 14.4(H)  RBC 4.22 - 5.81 MIL/uL 2.74(L) 2.31(L) 2.54(L)  HGB 13.0 - 17.0 g/dL 7.7(L) 6.5(LL) 7.3(L)  HCT 39.0 - 52.0 % 24.3(L) 20.5(L) 22.5(L)  PLT 150 - 400  K/uL 328 330 335  NEUTROABS 1.7 - 7.7 K/uL - - -  LYMPHSABS 0.7 - 4.0 K/uL - - -     There is no height or weight on file to calculate BMI.  Orders:  No orders of the defined types were placed in this encounter.  No orders of the defined types were placed in this encounter.    Procedures: No procedures performed  Clinical Data: No additional findings.  ROS:  All other systems negative, except as noted in the HPI. Review of Systems  Objective: Vital Signs: There were no vitals taken for this visit.  Specialty Comments:  No specialty comments available.  PMFS History: Patient Active Problem List   Diagnosis Date Noted   Duodenal ulcer    Gastritis and gastroduodenitis     Acute blood loss anemia    Dieulafoy lesion of duodenum    Pressure injury of skin 08/26/2021   Cellulitis of left lower extremity    Septic arthritis of knee ([place]) 08/21/2021   MSSA (methicillin susceptible Staphylococcus aureus) infection 08/21/2021   S/P debridement 08/19/2021   Altered mental status 08/18/2021   Bacteremia    Acute respiratory distress    Acute kidney injury ([place])    Sepsis ([place])    Symptomatic anemia    Severe protein-calorie malnutrition ([place])    Wound infection    Dehiscence of amputation stump ([place])    Osteomyelitis ([place]) 03/26/2018   Cellulitis of foot without toes, right 03/03/2018   History of transmetatarsal amputation of right foot ([place]) 02/19/2018   Amputated toe of right foot ([place]) 01/22/2018   Gangrene of right foot ([place])    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 ([place]) 01/14/2018   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 ([place]) 01/14/2018   S/P BKA (below knee amputation) bilateral ([place]) 04/11/2016   History of tobacco abuse 03/31/2016   Osteomyelitis of left foot ([place])    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   Septic arthritis of left foot ([place]) 03/27/2016   Solitary pulmonary nodule 08/14/2015   Precordial chest pain 08/13/2015   Peripheral arterial disease ([place]) 04/18/2015   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   Right anterior knee pain 11/19/2012   Toenail deformity 01/16/2012   Gout    Diabetic neuropathy ([place]) 03/31/2008   ALCOHOLISM 03/31/2008   Rosacea 03/31/2008   GERD 01/31/2008   COPD (chronic obstructive pulmonary disease) ([place]) 71/03/2693   DM W/COMPLICATION NOS, TYPE II 05/08/2007   OBESITY 05/08/2007   HTN (hypertension) 05/08/2007   HERNIATED [place] 05/08/2007   HISTORY OF ASBESTOS EXPOSURE 05/08/2007   Chronic lower back pain on narcotics  06/16/2005   HLD (hyperlipidemia) 03/22/2002   Past Medical History:  Diagnosis Date   Anxiety    Panic  attack - 10  years aago- ? panic    Asthma    Cellulitis of left foot 2016   Healed and recurred 01/2016   Cellulitis of right foot 06/30/2012   Chronic lower back pain 07/01/2012   COPD (chronic obstructive pulmonary disease) ([place])    Uses nebulizers at home. Former smoker, current chewing tobacco. 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   GERD (gastroesophageal reflux disease)    Gouty arthritis 07/01/2012   Not attacks in ~5 yrs (03/2016)   Hepatitis 1967   "in jail when I got it; dr said it was pretty bad; quaranteened X 30d"   Hyperlipidemia    Hypertension    Lung nodule, solitary 07/2015   Due for 1-yr  recheck in 07/2017   Peripheral arterial disease ([place])    critical limb ischemia   Peripheral neuropathy 07/01/2012   Seasonal allergies    Skin growth 07/01/2012   right side of nares; "been on there 20 years"   Substance abuse ([place])    Type II diabetes mellitus ([place]) 2002   Diagnosed in 2002    Family History  Problem Relation Age of Onset   Diabetes Sister    Hypertension Mother     Past Surgical History:  Procedure Laterality Date   AMPUTATION Left 03/28/2016   Procedure: AMPUTATION left foot 4th and 5 th ray;  Surgeon: Newt Minion, MD;  Location: [place];  Service: Orthopedics;  Laterality: Left;   AMPUTATION Left 04/11/2016   Procedure: LEFT BELOW KNEE AMPUTATION;  Surgeon: Newt Minion, MD;  Location: [place];  Service: Orthopedics;  Laterality: Left;   AMPUTATION Right 01/22/2018   Procedure: RIGHT FOOT 3RD RAY AMPUTATION;  Surgeon: Newt Minion, MD;  Location: [place];  Service: Orthopedics;  Laterality: Right;   AMPUTATION Right 02/19/2018   Procedure: RIGHT TRANSMETATARSAL AMPUTATION;  Surgeon: Newt Minion, MD;  Location: [employer];  Service: Orthopedics;  Laterality: Right;   AMPUTATION Right 03/28/2018   Procedure: RIGHT BELOW KNEE AMPUTATION;  Surgeon: Newt Minion, MD;  Location: [place];  Service: Orthopedics;  Laterality: Right;   AMPUTATION Left 09/11/2021   Procedure: LEFT ABOVE KNEE  AMPUTATION;  Surgeon: Newt Minion, MD;  Location: [place];  Service: Orthopedics;  Laterality: Left;   BIOPSY  08/25/2021   Procedure: BIOPSY;  Surgeon: Daryel November, MD;  Location: [facility] ENDOSCOPY;  Service: Gastroenterology;;   ESOPHAGOGASTRODUODENOSCOPY Left 09/05/2021   Procedure: ESOPHAGOGASTRODUODENOSCOPY (EGD);  Surgeon: Lavena Bullion, DO;  Location: [facility] ENDOSCOPY;  Service: Gastroenterology;  Laterality: Left;   ESOPHAGOGASTRODUODENOSCOPY (EGD) WITH PROPOFOL N/A 08/25/2021   Procedure: ESOPHAGOGASTRODUODENOSCOPY (EGD) WITH PROPOFOL;  Surgeon: Daryel November, MD;  Location: [place];  Service: Gastroenterology;  Laterality: N/A;   HEMOSTASIS CLIP PLACEMENT  09/05/2021   Procedure: HEMOSTASIS CLIP PLACEMENT;  Surgeon: Lavena Bullion, DO;  Location: [place];  Service: Gastroenterology;;   HOT HEMOSTASIS  08/25/2021   Procedure: HOT HEMOSTASIS (ARGON PLASMA COAGULATION/BICAP);  Surgeon: Daryel November, MD;  Location[facility] ENDOSCOPY;  Service: Gastroenterology;;   HOT HEMOSTASIS N/A 09/05/2021   Procedure: HOT HEMOSTASIS (ARGON PLASMA COAGULATION/BICAP);  Surgeon: Lavena Bullion, DO;  Location[facility];  Service: Gastroenterology;  Laterality: N/A;   I & D EXTREMITY Left 08/19/2021   Procedure: IRRIGATION AND DEBRIDEMENT LEFT KNEE;  Surgeon: Paralee Cancel, MD;  Location: [place];  Service: Orthopedics;  Laterality: Left;   I & D EXTREMITY Left 08/29/2021   Procedure: IRRIGATION AND DEBRIDEMENT Left Knee;  Surgeon: Paralee Cancel, MD;  Location: [place];  Service: Orthopedics;  Laterality: Left;   LEG AMPUTATION BELOW KNEE Left 04/11/2016   LOWER EXTREMITY ANGIOGRAM N/A 04/18/2013   Procedure: LOWER EXTREMITY ANGIOGRAM;  Surgeon: Sherren Mocha, MD;  Location: [facility] CATH LAB;  Service: Cardiovascular;  Laterality: N/A;   LOWER EXTREMITY ANGIOGRAM  08/13/2015   bilateral iliac    PERIPHERAL VASCULAR CATHETERIZATION Bilateral 08/13/2015   Procedure: Lower Extremity  Angiography;  Surgeon: Lorretta Harp, MD;  Location: [place] CV LAB;  Service: Cardiovascular;  Laterality: Bilateral;   PERIPHERAL VASCULAR CATHETERIZATION N/A 08/13/2015   Procedure: Abdominal Aortogram;  Surgeon: Lorretta Harp, MD;  Location: [place] CV LAB;  Service: Cardiovascular;  Laterality: N/A;  SCLEROTHERAPY  08/25/2021   Procedure: SCLEROTHERAPY;  Surgeon: Daryel November, MD;  Location: [place];  Service: Gastroenterology;;   Clide Deutscher  09/05/2021   Procedure: Clide Deutscher;  Surgeon: Lavena Bullion, DO;  Location: [facility] ENDOSCOPY;  Service: Gastroenterology;;   TOE AMPUTATION Right 01/22/2018   3RD TOE RIGHT FOOT   Social History   Occupational History   Occupation: disabled  Tobacco Use   Smoking status: Former    Packs/day: 2.00    Years: 40.00    Pack years: 80.00    Types: Cigarettes    Quit date: 01/13/2002    Years since quitting: 19.7   Smokeless tobacco: Current    Types: Chew   Tobacco comments:    chews a little tobacco  Vaping Use   Vaping Use: Never used  Substance and Sexual Activity   Alcohol use: Not Currently    Comment: 03/03/18: "Used to drink alot. No alcohol in a long time."   Drug use: No    Types: Marijuana    Comment: 03/27/2016 "last marijuana was maybe in 2013"   Sexual activity: Never

## 2021-10-04 ENCOUNTER — Emergency Department (HOSPITAL_COMMUNITY): Payer: Medicare (Managed Care)

## 2021-10-04 ENCOUNTER — Encounter (HOSPITAL_COMMUNITY): Payer: Self-pay | Admitting: Pharmacy Technician

## 2021-10-04 ENCOUNTER — Inpatient Hospital Stay (HOSPITAL_COMMUNITY)
Admission: EM | Admit: 2021-10-04 | Discharge: 2021-10-07 | DRG: 698 | Disposition: A | Discharge status: Other Acute Inpatient Hospital | Payer: Medicare (Managed Care) | Attending: Student in an Organized Health Care Education/Training Program | Admitting: Student in an Organized Health Care Education/Training Program

## 2021-10-04 DIAGNOSIS — E1122 Type 2 diabetes mellitus with diabetic chronic kidney disease: Secondary | ICD-10-CM | POA: Diagnosis present

## 2021-10-04 DIAGNOSIS — N184 Chronic kidney disease, stage 4 (severe): Secondary | ICD-10-CM | POA: Diagnosis present

## 2021-10-04 DIAGNOSIS — N390 Urinary tract infection, site not specified: Secondary | ICD-10-CM | POA: Diagnosis present

## 2021-10-04 DIAGNOSIS — Z833 Family history of diabetes mellitus: Secondary | ICD-10-CM

## 2021-10-04 DIAGNOSIS — K219 Gastro-esophageal reflux disease without esophagitis: Secondary | ICD-10-CM | POA: Diagnosis present

## 2021-10-04 DIAGNOSIS — M6284 Sarcopenia: Secondary | ICD-10-CM | POA: Diagnosis present

## 2021-10-04 DIAGNOSIS — Z800 Family history of malignant neoplasm of digestive organs: Secondary | ICD-10-CM

## 2021-10-04 DIAGNOSIS — D631 Anemia in chronic kidney disease: Secondary | ICD-10-CM | POA: Diagnosis present

## 2021-10-04 DIAGNOSIS — R531 Weakness: Secondary | ICD-10-CM | POA: Diagnosis not present

## 2021-10-04 DIAGNOSIS — Y828 Other medical devices associated with adverse incidents: Secondary | ICD-10-CM | POA: Diagnosis present

## 2021-10-04 DIAGNOSIS — Z794 Long term (current) use of insulin: Secondary | ICD-10-CM

## 2021-10-04 DIAGNOSIS — Z515 Encounter for palliative care: Secondary | ICD-10-CM | POA: Diagnosis not present

## 2021-10-04 DIAGNOSIS — L89156 Pressure-induced deep tissue damage of sacral region: Secondary | ICD-10-CM | POA: Diagnosis present

## 2021-10-04 DIAGNOSIS — E1165 Type 2 diabetes mellitus with hyperglycemia: Secondary | ICD-10-CM | POA: Diagnosis present

## 2021-10-04 DIAGNOSIS — K269 Duodenal ulcer, unspecified as acute or chronic, without hemorrhage or perforation: Secondary | ICD-10-CM | POA: Diagnosis not present

## 2021-10-04 DIAGNOSIS — E43 Unspecified severe protein-calorie malnutrition: Secondary | ICD-10-CM | POA: Diagnosis present

## 2021-10-04 DIAGNOSIS — E1151 Type 2 diabetes mellitus with diabetic peripheral angiopathy without gangrene: Secondary | ICD-10-CM | POA: Diagnosis present

## 2021-10-04 DIAGNOSIS — Z20822 Contact with and (suspected) exposure to covid-19: Secondary | ICD-10-CM | POA: Diagnosis present

## 2021-10-04 DIAGNOSIS — E162 Hypoglycemia, unspecified: Secondary | ICD-10-CM | POA: Diagnosis present

## 2021-10-04 DIAGNOSIS — E1140 Type 2 diabetes mellitus with diabetic neuropathy, unspecified: Secondary | ICD-10-CM | POA: Diagnosis present

## 2021-10-04 DIAGNOSIS — K264 Chronic or unspecified duodenal ulcer with hemorrhage: Secondary | ICD-10-CM | POA: Diagnosis present

## 2021-10-04 DIAGNOSIS — N179 Acute kidney failure, unspecified: Secondary | ICD-10-CM | POA: Diagnosis present

## 2021-10-04 DIAGNOSIS — Z7984 Long term (current) use of oral hypoglycemic drugs: Secondary | ICD-10-CM

## 2021-10-04 DIAGNOSIS — E11649 Type 2 diabetes mellitus with hypoglycemia without coma: Secondary | ICD-10-CM | POA: Diagnosis present

## 2021-10-04 DIAGNOSIS — Z66 Do not resuscitate: Secondary | ICD-10-CM | POA: Diagnosis not present

## 2021-10-04 DIAGNOSIS — R339 Retention of urine, unspecified: Secondary | ICD-10-CM | POA: Diagnosis present

## 2021-10-04 DIAGNOSIS — Z888 Allergy status to other drugs, medicaments and biological substances status: Secondary | ICD-10-CM

## 2021-10-04 DIAGNOSIS — R627 Adult failure to thrive: Secondary | ICD-10-CM | POA: Diagnosis not present

## 2021-10-04 DIAGNOSIS — E785 Hyperlipidemia, unspecified: Secondary | ICD-10-CM | POA: Diagnosis present

## 2021-10-04 DIAGNOSIS — M109 Gout, unspecified: Secondary | ICD-10-CM | POA: Diagnosis present

## 2021-10-04 DIAGNOSIS — L8990 Pressure ulcer of unspecified site, unspecified stage: Secondary | ICD-10-CM | POA: Insufficient documentation

## 2021-10-04 DIAGNOSIS — E871 Hypo-osmolality and hyponatremia: Secondary | ICD-10-CM | POA: Diagnosis present

## 2021-10-04 DIAGNOSIS — Z7982 Long term (current) use of aspirin: Secondary | ICD-10-CM

## 2021-10-04 DIAGNOSIS — F419 Anxiety disorder, unspecified: Secondary | ICD-10-CM | POA: Diagnosis present

## 2021-10-04 DIAGNOSIS — J449 Chronic obstructive pulmonary disease, unspecified: Secondary | ICD-10-CM | POA: Diagnosis present

## 2021-10-04 DIAGNOSIS — I129 Hypertensive chronic kidney disease with stage 1 through stage 4 chronic kidney disease, or unspecified chronic kidney disease: Secondary | ICD-10-CM | POA: Diagnosis present

## 2021-10-04 DIAGNOSIS — E11622 Type 2 diabetes mellitus with other skin ulcer: Secondary | ICD-10-CM | POA: Diagnosis present

## 2021-10-04 DIAGNOSIS — T83511A Infection and inflammatory reaction due to indwelling urethral catheter, initial encounter: Secondary | ICD-10-CM | POA: Diagnosis not present

## 2021-10-04 DIAGNOSIS — D649 Anemia, unspecified: Secondary | ICD-10-CM | POA: Diagnosis not present

## 2021-10-04 DIAGNOSIS — Z89612 Acquired absence of left leg above knee: Secondary | ICD-10-CM

## 2021-10-04 DIAGNOSIS — K2970 Gastritis, unspecified, without bleeding: Secondary | ICD-10-CM | POA: Diagnosis present

## 2021-10-04 DIAGNOSIS — D6489 Other specified anemias: Secondary | ICD-10-CM | POA: Diagnosis present

## 2021-10-04 DIAGNOSIS — Z89511 Acquired absence of right leg below knee: Secondary | ICD-10-CM

## 2021-10-04 DIAGNOSIS — E119 Type 2 diabetes mellitus without complications: Secondary | ICD-10-CM

## 2021-10-04 DIAGNOSIS — F32A Depression, unspecified: Secondary | ICD-10-CM | POA: Diagnosis present

## 2021-10-04 DIAGNOSIS — Z6829 Body mass index (BMI) 29.0-29.9, adult: Secondary | ICD-10-CM

## 2021-10-04 DIAGNOSIS — Z79899 Other long term (current) drug therapy: Secondary | ICD-10-CM

## 2021-10-04 DIAGNOSIS — Z8249 Family history of ischemic heart disease and other diseases of the circulatory system: Secondary | ICD-10-CM

## 2021-10-04 DIAGNOSIS — Z87891 Personal history of nicotine dependence: Secondary | ICD-10-CM

## 2021-10-04 LAB — COMPREHENSIVE METABOLIC PANEL
ALT: 18 U/L (ref 0–44)
AST: 30 U/L (ref 15–41)
Albumin: 2.2 g/dL — ABNORMAL LOW (ref 3.5–5.0)
Alkaline Phosphatase: 93 U/L (ref 38–126)
Anion gap: 8 (ref 5–15)
BUN: 86 mg/dL — ABNORMAL HIGH (ref 8–23)
CO2: 20 mmol/L — ABNORMAL LOW (ref 22–32)
Calcium: 8.4 mg/dL — ABNORMAL LOW (ref 8.9–10.3)
Chloride: 102 mmol/L (ref 98–111)
Creatinine, Ser: 2.01 mg/dL — ABNORMAL HIGH (ref 0.61–1.24)
GFR, Estimated: 34 mL/min — ABNORMAL LOW (ref >60)
Glucose, Bld: 68 mg/dL — ABNORMAL LOW (ref 70–99)
Potassium: 4.5 mmol/L (ref 3.5–5.1)
Sodium: 130 mmol/L — ABNORMAL LOW (ref 135–145)
Total Bilirubin: 0.3 mg/dL (ref 0.3–1.2)
Total Protein: 6.5 g/dL (ref 6.5–8.1)

## 2021-10-04 LAB — URINALYSIS, ROUTINE W REFLEX MICROSCOPIC
Bilirubin Urine: NEGATIVE
Glucose, UA: NEGATIVE mg/dL
Ketones, ur: NEGATIVE mg/dL
Nitrite: NEGATIVE
Protein, ur: 30 mg/dL — AB
Specific Gravity, Urine: 1.013 (ref 1.005–1.030)
WBC, UA: >50 WBC/hpf — ABNORMAL HIGH (ref 0–5)
pH: 5 (ref 5.0–8.0)

## 2021-10-04 LAB — LACTIC ACID, PLASMA: Lactic Acid, Venous: 1.9 mmol/L (ref 0.5–1.9)

## 2021-10-04 LAB — CBC WITH DIFFERENTIAL/PLATELET
Abs Immature Granulocytes: 0.05 10*3/uL (ref 0.00–0.07)
Basophils Absolute: 0 10*3/uL (ref 0.0–0.1)
Basophils Relative: 0 %
Eosinophils Absolute: 0.1 10*3/uL (ref 0.0–0.5)
Eosinophils Relative: 1 %
HCT: 23.1 % — ABNORMAL LOW (ref 39.0–52.0)
Hemoglobin: 7 g/dL — ABNORMAL LOW (ref 13.0–17.0)
Immature Granulocytes: 1 %
Lymphocytes Relative: 33 %
Lymphs Abs: 3.5 10*3/uL (ref 0.7–4.0)
MCH: 28.7 pg (ref 26.0–34.0)
MCHC: 30.3 g/dL (ref 30.0–36.0)
MCV: 94.7 fL (ref 80.0–100.0)
Monocytes Absolute: 1.1 10*3/uL — ABNORMAL HIGH (ref 0.1–1.0)
Monocytes Relative: 10 %
Neutro Abs: 5.9 10*3/uL (ref 1.7–7.7)
Neutrophils Relative %: 55 %
Platelets: 229 10*3/uL (ref 150–400)
RBC: 2.44 MIL/uL — ABNORMAL LOW (ref 4.22–5.81)
RDW: 17.9 % — ABNORMAL HIGH (ref 11.5–15.5)
WBC: 10.7 10*3/uL — ABNORMAL HIGH (ref 4.0–10.5)
nRBC: 0 % (ref 0.0–0.2)

## 2021-10-04 LAB — IRON AND TIBC
Iron: 46 ug/dL (ref 45–182)
Saturation Ratios: 23 % (ref 17.9–39.5)
TIBC: 200 ug/dL — ABNORMAL LOW (ref 250–450)
UIBC: 154 ug/dL

## 2021-10-04 LAB — APTT: aPTT: 50 seconds — ABNORMAL HIGH (ref 24–36)

## 2021-10-04 LAB — I-STAT CHEM 8, ED
BUN: 106 mg/dL — ABNORMAL HIGH (ref 8–23)
Calcium, Ion: 1.19 mmol/L (ref 1.15–1.40)
Chloride: 103 mmol/L (ref 98–111)
Creatinine, Ser: 2 mg/dL — ABNORMAL HIGH (ref 0.61–1.24)
Glucose, Bld: 65 mg/dL — ABNORMAL LOW (ref 70–99)
HCT: 22 % — ABNORMAL LOW (ref 39.0–52.0)
Hemoglobin: 7.5 g/dL — ABNORMAL LOW (ref 13.0–17.0)
Potassium: 4.5 mmol/L (ref 3.5–5.1)
Sodium: 133 mmol/L — ABNORMAL LOW (ref 135–145)
TCO2: 21 mmol/L — ABNORMAL LOW (ref 22–32)

## 2021-10-04 LAB — I-STAT VENOUS BLOOD GAS, ED
Acid-base deficit: 5 mmol/L — ABNORMAL HIGH (ref 0.0–2.0)
Bicarbonate: 20.7 mmol/L (ref 20.0–28.0)
Calcium, Ion: 1.21 mmol/L (ref 1.15–1.40)
HCT: 22 % — ABNORMAL LOW (ref 39.0–52.0)
Hemoglobin: 7.5 g/dL — ABNORMAL LOW (ref 13.0–17.0)
O2 Saturation: 83 %
Potassium: 4.6 mmol/L (ref 3.5–5.1)
Sodium: 135 mmol/L (ref 135–145)
TCO2: 22 mmol/L (ref 22–32)
pCO2, Ven: 40.6 mmHg — ABNORMAL LOW (ref 44.0–60.0)
pH, Ven: 7.314 (ref 7.250–7.430)
pO2, Ven: 52 mmHg — ABNORMAL HIGH (ref 32.0–45.0)

## 2021-10-04 LAB — PROTIME-INR
INR: 1.3 — ABNORMAL HIGH (ref 0.8–1.2)
Prothrombin Time: 16 seconds — ABNORMAL HIGH (ref 11.4–15.2)

## 2021-10-04 LAB — RESP PANEL BY RT-PCR (FLU A&B, COVID) ARPGX2
Influenza A by PCR: NEGATIVE
Influenza B by PCR: NEGATIVE
SARS Coronavirus 2 by RT PCR: NEGATIVE

## 2021-10-04 LAB — CBG MONITORING, ED
Glucose-Capillary: 55 mg/dL — ABNORMAL LOW (ref 70–99)
Glucose-Capillary: 122 mg/dL — ABNORMAL HIGH (ref 70–99)
Glucose-Capillary: 114 mg/dL — ABNORMAL HIGH (ref 70–99)

## 2021-10-04 LAB — FERRITIN: Ferritin: 149 ng/mL (ref 24–336)

## 2021-10-04 LAB — PREPARE RBC (CROSSMATCH)

## 2021-10-04 LAB — POC OCCULT BLOOD, ED: Fecal Occult Bld: NEGATIVE

## 2021-10-04 MED ORDER — SODIUM CHLORIDE 0.9% FLUSH: 3.0000 mL | INTRAVENOUS | Status: DC | PRN

## 2021-10-04 MED ORDER — MIRTAZAPINE 7.5 MG PO TABS
7.5000 mg | ORAL_TABLET | Freq: Every day | ORAL | Status: DC
  Administered 2021-10-04: 7.5 mg via ORAL
  Filled 2021-10-04: qty 1

## 2021-10-04 MED ORDER — LACTATED RINGERS IV BOLUS
1000.0000 mL | Freq: Once | INTRAVENOUS | Status: AC
  Administered 2021-10-04: 1000 mL via INTRAVENOUS

## 2021-10-04 MED ORDER — PANTOPRAZOLE SODIUM 40 MG PO TBEC
40.0000 mg | DELAYED_RELEASE_TABLET | Freq: Every day | ORAL | Status: DC
  Administered 2021-10-04: 40 mg via ORAL
  Filled 2021-10-04 (×2): qty 1

## 2021-10-04 MED ORDER — ACETAMINOPHEN 325 MG PO TABS
650.0000 mg | ORAL_TABLET | Freq: Four times a day (QID) | ORAL | Status: DC | PRN
  Administered 2021-10-07: 650 mg via ORAL
  Filled 2021-10-04: qty 2

## 2021-10-04 MED ORDER — SODIUM CHLORIDE 0.9 % IV SOLN
1.0000 g | INTRAVENOUS | Status: DC
Start: 2021-10-05 — End: 2021-10-05
  Administered 2021-10-05: 1 g via INTRAVENOUS
  Filled 2021-10-04: qty 10

## 2021-10-04 MED ORDER — TRAZODONE HCL 50 MG PO TABS
50.0000 mg | ORAL_TABLET | Freq: Every day | ORAL | Status: DC
  Administered 2021-10-04 – 2021-10-06 (×3): 50 mg via ORAL
  Filled 2021-10-04 (×3): qty 1

## 2021-10-04 MED ORDER — ALLOPURINOL 100 MG PO TABS
100.0000 mg | ORAL_TABLET | Freq: Every day | ORAL | Status: DC
  Administered 2021-10-04 – 2021-10-06 (×3): 100 mg via ORAL
  Filled 2021-10-04 (×3): qty 1

## 2021-10-04 MED ORDER — CARBAMAZEPINE 200 MG PO TABS: 200.0000 mg | ORAL_TABLET | Freq: Every day | ORAL | Status: DC

## 2021-10-04 MED ORDER — ALBUTEROL SULFATE (2.5 MG/3ML) 0.083% IN NEBU: 2.5000 mg | INHALATION_SOLUTION | Freq: Four times a day (QID) | RESPIRATORY_TRACT | Status: DC | PRN

## 2021-10-04 MED ORDER — SODIUM CHLORIDE 0.9 % IV SOLN: 250.0000 mL | INTRAVENOUS | Status: DC | PRN

## 2021-10-04 MED ORDER — ENOXAPARIN SODIUM 30 MG/0.3ML IJ SOSY
30.0000 mg | PREFILLED_SYRINGE | INTRAMUSCULAR | Status: DC
  Administered 2021-10-04 – 2021-10-06 (×3): 30 mg via SUBCUTANEOUS
  Filled 2021-10-04 (×3): qty 0.3

## 2021-10-04 MED ORDER — SODIUM CHLORIDE 0.9 % IV SOLN: 10.0000 mL/h | Freq: Once | INTRAVENOUS | Status: DC

## 2021-10-04 MED ORDER — ACETAMINOPHEN 650 MG RE SUPP: 650.0000 mg | Freq: Four times a day (QID) | RECTAL | Status: DC | PRN

## 2021-10-04 MED ORDER — LORAZEPAM 0.5 MG PO TABS: 0.2500 mg | ORAL_TABLET | Freq: Every day | ORAL | Status: DC | PRN

## 2021-10-04 MED ORDER — SODIUM CHLORIDE 0.9 % IV SOLN
2.0000 g | Freq: Once | INTRAVENOUS | Status: AC
  Administered 2021-10-04: 2 g via INTRAVENOUS
  Filled 2021-10-04: qty 2

## 2021-10-04 MED ORDER — OXYCODONE HCL 5 MG PO TABS
5.0000 mg | ORAL_TABLET | Freq: Four times a day (QID) | ORAL | Status: DC | PRN
  Administered 2021-10-04 – 2021-10-07 (×3): 5 mg via ORAL
  Filled 2021-10-04 (×3): qty 1

## 2021-10-04 MED ORDER — SODIUM CHLORIDE 0.9 % IV BOLUS
1000.0000 mL | Freq: Once | INTRAVENOUS | Status: AC
  Administered 2021-10-04: 1000 mL via INTRAVENOUS

## 2021-10-04 MED ORDER — ALBUTEROL SULFATE HFA 108 (90 BASE) MCG/ACT IN AERS: 2.0000 | INHALATION_SPRAY | Freq: Four times a day (QID) | RESPIRATORY_TRACT | Status: DC | PRN

## 2021-10-04 MED ORDER — SODIUM CHLORIDE 0.9 % IV SOLN: 2.0000 g | Freq: Two times a day (BID) | INTRAVENOUS | Status: DC

## 2021-10-04 MED ORDER — LACTATED RINGERS IV SOLN: INTRAVENOUS | Status: DC

## 2021-10-04 MED ORDER — DEXTROSE 10 % IV SOLN: 100.0000 mL | Freq: Once | INTRAVENOUS | Status: DC

## 2021-10-04 MED ORDER — INSULIN ASPART 100 UNIT/ML IJ SOLN: 0.0000 [IU] | Freq: Three times a day (TID) | INTRAMUSCULAR | Status: DC

## 2021-10-04 MED ORDER — DULOXETINE HCL 60 MG PO CPEP
60.0000 mg | ORAL_CAPSULE | Freq: Every day | ORAL | Status: DC
  Filled 2021-10-04: qty 1

## 2021-10-04 MED ORDER — SODIUM CHLORIDE 0.9% FLUSH
3.0000 mL | Freq: Two times a day (BID) | INTRAVENOUS | Status: DC
  Administered 2021-10-05 – 2021-10-07 (×5): 3 mL via INTRAVENOUS

## 2021-10-04 NOTE — ED Triage Notes (Signed)
Pt bib ems from [facility] with low hgb and concern for sepsis due to weakness, lethargy. Pt dx with UTI yesterday and started on abx. Foley catheter changed yesterday. Pt denies any pain. Given 500cc NS en route.

## 2021-10-04 NOTE — H&P (Addendum)
Date: 10/04/2021               Patient Name:  Leslie [name] MRN: [id number]  DOB: [birth year] Age / Sex: 75 y.o., male   PCP: [facility] [place]         Medical Service: Internal Medicine Teaching Service         Attending Physician: Dr. Evette Doffing, Mallie Mussel, *    First Contact: Scarlett Presto, MD Pager: AD [phone]  Second Contact: [name], MD Pager: Governor Rooks [phone]       After Hours (After 5p/  First Contact Pager: [phone]  weekends / holidays): Second Contact Pager: [phone]   SUBJECTIVE   Chief Complaint: weakness   History of Present Illness: Leslie [name] is a 75 year old male who is a patient of pace history of hypertension, hyperlipidemia, type 2 diabetes, COPD, PVD s/p right BKA and recent admission in November for septic arthritis of the left knee status post left AKA, alcohol use disorder here for weakness.  Patient recently discharged on 11/18 for septic arthritis of the left knee from diabetic ulcer leading ultimately to left AKA.  Has been a resident of [facility] since then.  Has been having burning with urination, urinary retention for the past couple of days.  He has a Foley that he states was exchanged yesterday when he was evaluated at his PCPs office at [facility].  UA was concerning for UTI and he was started on ciprofloxacin which he has taken 1 day of.  States that there was some blood clots when his Foley was removed and his dysuria has resolved since his new Foley was placed yesterday.  Was noted to have increasing weakness in addition to low hemoglobin of 7.3 and leukocytosis at his outpatient labs and advised to come to the ED for concern for sepsis.  Patient states he was feeling generally weak and has not been eating for the past couple of days.  States he has not been eating due to poor appetite.  Otherwise he states he is been doing well in rehab and is hopeful for discharge home soon.  He denies any episodes of dark or melanotic stools.  He  denies any vomiting or hematemesis.  He denies fever, chills, chest pain, shortness of breath, cough, nausea, vomiting, constipation, diarrhea, loss of consciousness, fall, dysphagia, dark or tarry stools.  ED Course: In the ED patient was noted to be afebrile but with leukocytosis of 11.  Hemoglobin of 7.  Mild hypotension with SBP's in the low 100s.  Noted to be hypoglycemic to 55.  This improved with oral glucose.  He was started on cefepime for UTI.  Started on fluids and transfused with 1 unit of packed red blood cells for symptomatic anemia. IMTS was called for admission.  Meds:   Albuterol q6h PRN Amlodipine 10 mg daily Aspirin 81 mg daily Lorazepam 0.25 mg daily PRN Cymbalta 60 mg daily Lasix 20 mg daily Hydralazine 50 mg TID Semglee 75u daily Losartan 100 mg daily Metformin 850 mg BID Metoprolol 200 mg BID Mirtazapine 7.5 mg qhs Nitro prn Trazodone 50 mg QHS Tegretol BID (nerve pain) Oxycodone 5 mg PRN q6 Cipro 500mg  BID   Current Meds  Medication Sig   acetaminophen (TYLENOL) 325 MG tablet Take 2 tablets (650 mg total) by mouth every 6 (six) hours as needed for mild pain (or Fever >/= 101). (Patient taking differently: Take 650 mg by mouth every 8 (eight) hours as needed for mild pain (  or Fever >/= 101).)   albuterol (PROVENTIL HFA;VENTOLIN HFA) 108 (90 Base) MCG/ACT inhaler Inhale 2 puffs into the lungs 4 (four) times daily as needed for wheezing or shortness of breath.    albuterol (PROVENTIL) (2.5 MG/3ML) 0.083% nebulizer solution Take 2.5 mg by nebulization every 6 (six) hours as needed for wheezing or shortness of breath.   allopurinol (ZYLOPRIM) 100 MG tablet Take 100 mg by mouth at bedtime. For gout   AMINO ACIDS-PROTEIN HYDROLYS PO Take 1 Dose by mouth 2 (two) times daily. Add SF Pro-Stat   amLODipine (NORVASC) 10 MG tablet Take 1 tablet (10 mg total) by mouth daily.   aspirin 81 MG chewable tablet Chew 81 mg by mouth daily.   calcium carbonate (TUMS EX) 750 MG  chewable tablet Chew 1 tablet by mouth 2 (two) times daily as needed for heartburn.   Cholecalciferol (VITAMIN D-3) 1000 units CAPS Take 1,000 Units by mouth daily.   ciprofloxacin (CIPRO) 500 MG tablet Take 500 mg by mouth 2 (two) times daily. Every 14 (fourteen) days   escitalopram (LEXAPRO) 10 MG tablet Take 10 mg by mouth daily.   Evolocumab with Infusor ([place]) 420 MG/3.5ML SOCT Inject 420 mg into the skin every 30 (thirty) days.   famotidine (PEPCID) 20 MG tablet Take 20 mg by mouth at bedtime.   ferrous sulfate 325 (65 FE) MG tablet Take 325 mg by mouth every other day.   insulin glargine (LANTUS) 100 UNIT/ML injection Inject 75 Units into the skin daily.   LORazepam (ATIVAN) 0.5 MG tablet Take 0.25 mg by mouth daily as needed for anxiety (x2 months).   losartan-hydrochlorothiazide (HYZAAR) 100-25 MG tablet Take 1 tablet by mouth daily.   Melatonin 10 MG TABS Take 10 mg by mouth at bedtime.   metFORMIN (GLUCOPHAGE) 850 MG tablet Take 850 mg by mouth 2 (two) times daily with a meal.   metoprolol (TOPROL-XL) 200 MG 24 hr tablet Take 200 mg by mouth daily. Take with or immediately following a meal.   Multiple Vitamins-Minerals (CERTAVITE SENIOR/ANTIOXIDANT) TABS Take 1 tablet by mouth daily.   nitroGLYCERIN (NITROSTAT) 0.4 MG SL tablet Place 0.4 mg under the tongue every 5 (five) minutes x 3 doses as needed for chest pain.    oxyCODONE (OXY IR/ROXICODONE) 5 MG immediate release tablet Take 1-2 tablets (5-10 mg total) by mouth every 4 (four) hours as needed for moderate pain (pain score 4-6).   senna-docusate (SENOKOT-S) 8.6-50 MG tablet Take 2 tablets by mouth daily.   Skin Protectants, Misc. (MINERIN) CREA Apply 1 application topically 2 (two) times daily as needed (dryness).   traZODone (DESYREL) 50 MG tablet Take 50 mg by mouth at bedtime.   vitamin B-12 (CYANOCOBALAMIN) 1000 MCG tablet Take 1,000 mcg by mouth daily.    Past Medical History:  Diagnosis Date    Anxiety    Panic attack - 10  years aago- ? panic    Asthma    Cellulitis of left foot 2016   Healed and recurred 01/2016   Cellulitis of right foot 06/30/2012   Chronic lower back pain 07/01/2012   COPD (chronic obstructive pulmonary disease) ([place])    Uses nebulizers at home. Former smoker, current chewing tobacco. 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   GERD (gastroesophageal reflux disease)    Gouty arthritis 07/01/2012   Not attacks in ~5 yrs (03/2016)   Hepatitis 1967   "in jail when I got it; dr said it was pretty bad; quaranteened X 30d"   Hyperlipidemia  Hypertension    Lung nodule, solitary 07/2015   Due for 1-yr recheck in 07/2017   Peripheral arterial disease ([place])    critical limb ischemia   Peripheral neuropathy 07/01/2012   Seasonal allergies    Skin growth 07/01/2012   right side of nares; "been on there 20 years"   Substance abuse ([place])    Type II diabetes mellitus ([place]) 2002   Diagnosed in 2002    Past Surgical History:  Procedure Laterality Date   AMPUTATION Left 03/28/2016   Procedure: AMPUTATION left foot 4th and 5 th ray;  Surgeon: Newt Minion, MD;  Location: [place];  Service: Orthopedics;  Laterality: Left;   AMPUTATION Left 04/11/2016   Procedure: LEFT BELOW KNEE AMPUTATION;  Surgeon: Newt Minion, MD;  Location: [place];  Service: Orthopedics;  Laterality: Left;   AMPUTATION Right 01/22/2018   Procedure: RIGHT FOOT 3RD RAY AMPUTATION;  Surgeon: Newt Minion, MD;  Location: [place];  Service: Orthopedics;  Laterality: Right;   AMPUTATION Right 02/19/2018   Procedure: RIGHT TRANSMETATARSAL AMPUTATION;  Surgeon: Newt Minion, MD;  Location: [place];  Service: Orthopedics;  Laterality: Right;   AMPUTATION Right 03/28/2018   Procedure: RIGHT BELOW KNEE AMPUTATION;  Surgeon: Newt Minion, MD;  Location: [place];  Service: Orthopedics;  Laterality: Right;   AMPUTATION Left 09/11/2021   Procedure: LEFT ABOVE KNEE AMPUTATION;  Surgeon: Newt Minion, MD;  Location: [place];  Service: Orthopedics;   Laterality: Left;   BIOPSY  08/25/2021   Procedure: BIOPSY;  Surgeon: Daryel November, MD;  Location: [facility] ENDOSCOPY;  Service: Gastroenterology;;   ESOPHAGOGASTRODUODENOSCOPY Left 09/05/2021   Procedure: ESOPHAGOGASTRODUODENOSCOPY (EGD);  Surgeon: Lavena Bullion, DO;  Location: [facility] ENDOSCOPY;  Service: Gastroenterology;  Laterality: Left;   ESOPHAGOGASTRODUODENOSCOPY (EGD) WITH PROPOFOL N/A 08/25/2021   Procedure: ESOPHAGOGASTRODUODENOSCOPY (EGD) WITH PROPOFOL;  Surgeon: Daryel November, MD;  Location: [place];  Service: Gastroenterology;  Laterality: N/A;   HEMOSTASIS CLIP PLACEMENT  09/05/2021   Procedure: HEMOSTASIS CLIP PLACEMENT;  Surgeon: Lavena Bullion, DO;  Location: [place];  Service: Gastroenterology;;   HOT HEMOSTASIS  08/25/2021   Procedure: HOT HEMOSTASIS (ARGON PLASMA COAGULATION/BICAP);  Surgeon: Daryel November, MD;  Location: [facility] ENDOSCOPY;  Service: Gastroenterology;;   HOT HEMOSTASIS N/A 09/05/2021   Procedure: HOT HEMOSTASIS (ARGON PLASMA COAGULATION/BICAP);  Surgeon: Lavena Bullion, DO;  Location: [facility] ENDOSCOPY;  Service: Gastroenterology;  Laterality: N/A;   I & D EXTREMITY Left 08/19/2021   Procedure: IRRIGATION AND DEBRIDEMENT LEFT KNEE;  Surgeon: Paralee Cancel, MD;  Location: [place];  Service: Orthopedics;  Laterality: Left;   I & D EXTREMITY Left 08/29/2021   Procedure: IRRIGATION AND DEBRIDEMENT Left Knee;  Surgeon: Paralee Cancel, MD;  Location: [place];  Service: Orthopedics;  Laterality: Left;   LEG AMPUTATION BELOW KNEE Left 04/11/2016   LOWER EXTREMITY ANGIOGRAM N/A 04/18/2013   Procedure: LOWER EXTREMITY ANGIOGRAM;  Surgeon: Sherren Mocha, MD;  Location: [facility] CATH LAB;  Service: Cardiovascular;  Laterality: N/A;   LOWER EXTREMITY ANGIOGRAM  08/13/2015   bilateral iliac    PERIPHERAL VASCULAR CATHETERIZATION Bilateral 08/13/2015   Procedure: Lower Extremity Angiography;  Surgeon: Lorretta Harp, MD;  Location: [place] CV LAB;   Service: Cardiovascular;  Laterality: Bilateral;   PERIPHERAL VASCULAR CATHETERIZATION N/A 08/13/2015   Procedure: Abdominal Aortogram;  Surgeon: Lorretta Harp, MD;  Location: [place] CV LAB;  Service: Cardiovascular;  Laterality: N/A;   SCLEROTHERAPY  08/25/2021   Procedure: SCLEROTHERAPY;  Surgeon: Daryel November, MD;  Location: [facility] ENDOSCOPY;  Service: Gastroenterology;;   Clide Deutscher  09/05/2021   Procedure: Clide Deutscher;  Surgeon: Lavena Bullion, DO;  Location: [place] ENDOSCOPY;  Service: Gastroenterology;;   TOE AMPUTATION Right 01/22/2018   3RD TOE RIGHT FOOT    Social:  Lives alone, reportedly able to complete ADLs independently prior to his last hospitalization in November.  After discussion with his PCP noted that he has had slow cognitive decline in the last few months especially notable since his last admission.  Patient states he lives alone with his dog and does not have many relatives nearby who can help with his care.  His PCP is concerned about his home situation and ability to live alone.  He uses motorized power wheelchair.  Notes history of smoking which he has stopped 20 years ago, does not he used to drink heavily but has not drink much in the last 2 years.  Denies alcohol usage in the last few weeks.  Family History:  Family History  Problem Relation Age of Onset   Diabetes Sister    Hypertension Mother     Allergies: Allergies as of 10/04/2021 - Review Complete 10/04/2021  Allergen Reaction Noted   Atorvastatin Other (See Comments) 03/26/2018   Statins Other (See Comments) 03/03/2018   Flexeril [cyclobenzaprine] Other (See Comments) 07/23/2012    Review of Systems: A complete ROS was negative except as per HPI.   OBJECTIVE:   Physical Exam: Blood pressure 125/61, pulse 71, temperature 97.7 F (36.5 C), temperature source Temporal, resp. rate 14, weight 81.8 kg, SpO2 100 %.  Constitutional: Chronically ill-appearing, obese laying in bed in no  acute distress HENT: normocephalic atraumatic, dry mucous membranes Eyes: conjunctiva non-erythematous Neck: supple Cardiovascular: regular rate and rhythm, no m/r/g Pulmonary/Chest: normal work of breathing on room air, lungs clear to auscultation bilaterally Abdominal: soft, non-tender, non-distended, normally active bowel sounds  MSK: Status post right BKA on the right, left AKA surgical incisions appear to be healing well no bleeding or discharge or erythema edema, no wound dehiscence, extremities are warm, no CVA tenderness Neurological: alert & oriented x 3 GU: Foley in place, bloody discharge around Foley insertion site. Skin: warm and dry   Labs: CBC    Component Value Date/Time   WBC 10.7 (H) 10/04/2021 1538   RBC 2.44 (L) 10/04/2021 1538   HGB 7.5 (L) 10/04/2021 1604   HGB 7.5 (L) 10/04/2021 1604   HCT 22.0 (L) 10/04/2021 1604   HCT 22.0 (L) 10/04/2021 1604   PLT 229 10/04/2021 1538   MCV 94.7 10/04/2021 1538   MCH 28.7 10/04/2021 1538   MCHC 30.3 10/04/2021 1538   RDW 17.9 (H) 10/04/2021 1538   LYMPHSABS 3.5 10/04/2021 1538   MONOABS 1.1 (H) 10/04/2021 1538   EOSABS 0.1 10/04/2021 1538   BASOSABS 0.0 10/04/2021 1538     CMP     Component Value Date/Time   NA 133 (L) 10/04/2021 1604   NA 135 10/04/2021 1604   K 4.5 10/04/2021 1604   K 4.6 10/04/2021 1604   CL 103 10/04/2021 1604   CO2 20 (L) 10/04/2021 1538   GLUCOSE 65 (L) 10/04/2021 1604   BUN 106 (H) 10/04/2021 1604   CREATININE 2.00 (H) 10/04/2021 1604   CREATININE 0.73 08/09/2012 0912   CALCIUM 8.4 (L) 10/04/2021 1538   PROT 6.5 10/04/2021 1538   ALBUMIN 2.2 (L) 10/04/2021 1538   AST 30 10/04/2021 1538   ALT 18 10/04/2021 1538  ALKPHOS 93 10/04/2021 1538   BILITOT 0.3 10/04/2021 1538   GFRNONAA 34 (L) 10/04/2021 1538   GFRAA >60 03/30/2018 0346    Imaging: DG Chest Portable 1 View  Result Date: 10/04/2021 CLINICAL DATA:  Low hemoglobin.  UTI. EXAM: PORTABLE CHEST 1 VIEW COMPARISON:   08/18/2021 FINDINGS: 1549 hours. The lungs are clear without focal pneumonia, edema, pneumothorax or pleural effusion. Cardiopericardial silhouette is at upper limits of normal for size. The visualized bony structures of the thorax show no acute abnormality. Telemetry leads overlie the chest. IMPRESSION: No active disease. Electronically Signed   By: Misty Stanley M.D.   On: 10/04/2021 16:03    EKG: personally reviewed my interpretation is sinus rhythm, prolonged QT and PR interval, unchanged t wave inversions in V4,5,6   ASSESSMENT & PLAN:    Assessment & Plan by Problem: Principal Problem:   Symptomatic anemia   Leslie [name] is a 75 y.o. with pertinent PMH of T2DM c/b neuropathy, HLD, HTN, COPD, sacral pressure ulcer,  bilateral BKAs,  recent upper GI bleed 2/2 duodenal ulcer, and an indwelling foley who presented with burning with urination and admitted for UTI and symptomatic anemia.  #Suspected UTI in the setting of indwelling foley Patient coming in with dysuria in the setting of an indwelling foley put in a few days ago due to concern for urinary retention.  Presents to present outpatient for concern for UTI.  UA with moderate hemoglobin, large leukocytes, many bacteria concerning for infection, mild leukocytosis to 10.7 but afebrile.  -s/p cefepime in the ED, switch to ceftriaxone - f/u Ucx, Bcx  #AKI Likely secondary to poor PO intake in the setting of UTI as above. Creatinine on admission 2.0 from a baseline of 1.0-1.2 - 1 L fluids - avoid nephrotoxic medications - daily bmp  #Symptomatic Anemia concerning for GI bleed Patient had a recent upper GI bleed due a duodenal ulcer at a prior admission. Heme occult in the ED was negative, however hemoglobin on admission was 7.0. No frank melena or hematochezia, prior hemoglobin 3 weeks ago was 7. Blood pressures have been low normal at running 88T-254D systolic. Will transfuse 1 unit of blood and discuss with GI. Do not suspect brisk  GI bleed at this time. - GI consult appreciate assistance; they will see him tomorrow - daily CBC - transfuse 1 unit - f/u post transfusion H/H  #GERD Continue Protonix  #DMII Patient endorses poor appetite. Holding metformin, last A1c in October was 5.8.  On Lantus 75 units at home.  CBGs 55 in the ED improved with p.o. glucose. - SSI - continue duloxetine - continue carbamazepine  #PAD - continue aspirin  #COPD -continue albuterol inhaler  #Sacral Ulcer --Continue wound care --Monitor for worsening condition  #HTN Holding home BP meds at this time 2/2 hypotension and AKI  #Gout Continue allopurinol  #Depression - continue trazadone and   #Hyponatremia Chronic, stable --Daily BMP  Diet: Normal VTE: SCDs IVF: None,None Code: DNI  Dispo: Admit patient to Inpatient with expected length of stay greater than 2 midnights.  Signed: [name] Internal Medicine Resident PGY-2 Pager: [phone]  10/04/2021, 9:10 PM

## 2021-10-04 NOTE — ED Notes (Signed)
1st unit PRBC completed with no adverse effect , IV site unremarkable , VSS/afebrile , denies pain /respirations unlabored.

## 2021-10-04 NOTE — ED Notes (Signed)
Pt provided with juice and crackers.

## 2021-10-04 NOTE — ED Provider Notes (Signed)
[facility] EMERGENCY DEPARTMENT Provider Note   CSN: [id number] Arrival date & time: 10/04/21  1524     History Chief Complaint  Patient presents with   Weakness    THERAN VANDERGRIFT is a 75 y.o. male.  The history is provided by the patient and medical records.  Weakness Severity:  Moderate Onset quality:  Gradual Duration:  2 days Timing:  Constant Progression:  Worsening Chronicity:  New Context: recent infection (diagnosed w/ UTI yesterday, found to be anemic to 7.3 on outp labs (does have hx of GI bleed))   Relieved by:  Nothing Worsened by:  Nothing Associated symptoms: no abdominal pain, no arthralgias, no chest pain, no cough, no dysuria, no fever, no seizures, no shortness of breath and no vomiting       Past Medical History:  Diagnosis Date   Anxiety    Panic attack - 10  years aago- ? panic    Asthma    Cellulitis of left foot 2016   Healed and recurred 01/2016   Cellulitis of right foot 06/30/2012   Chronic lower back pain 07/01/2012   COPD (chronic obstructive pulmonary disease) ([place])    Uses nebulizers at home. Former smoker, current chewing tobacco. 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   GERD (gastroesophageal reflux disease)    Gouty arthritis 07/01/2012   Not attacks in ~5 yrs (03/2016)   Hepatitis 1967   "in jail when I got it; dr said it was pretty bad; quaranteened X 30d"   Hyperlipidemia    Hypertension    Lung nodule, solitary 07/2015   Due for 1-yr recheck in 07/2017   Peripheral arterial disease ([place])    critical limb ischemia   Peripheral neuropathy 07/01/2012   Seasonal allergies    Skin growth 07/01/2012   right side of nares; "been on there 20 years"   Substance abuse ([place])    Type II diabetes mellitus ([place]) 2002   Diagnosed in 2002    Patient Active Problem List   Diagnosis Date Noted   Duodenal ulcer    Gastritis and gastroduodenitis    Acute blood loss anemia    Dieulafoy lesion of duodenum    Pressure injury of skin 08/26/2021    Cellulitis of left lower extremity    Septic arthritis of knee ([place]) 08/21/2021   MSSA (methicillin susceptible Staphylococcus aureus) infection 08/21/2021   S/P debridement 08/19/2021   Altered mental status 08/18/2021   Bacteremia    Acute respiratory distress    Acute kidney injury ([place])    Sepsis ([place])    Symptomatic anemia    Severe protein-calorie malnutrition ([place])    Wound infection    Dehiscence of amputation stump ([place])    Osteomyelitis ([place]) 03/26/2018   Cellulitis of foot without toes, right 03/03/2018   History of transmetatarsal amputation of right foot ([place]) 02/19/2018   Amputated toe of right foot ([place]) 01/22/2018   Gangrene of right foot (H[facility])    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 ([place]) 01/14/2018   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 ([place]) 01/14/2018   S/P BKA (below knee amputation) bilateral ([place]) 04/11/2016   History of tobacco abuse 03/31/2016   Osteomyelitis of left foot ([place])    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   Septic arthritis of left foot ([place]) 03/27/2016   Solitary pulmonary nodule 08/14/2015   Precordial chest pain 08/13/2015   Peripheral arterial disease ([place]) 04/18/2015   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  Right anterior knee pain 11/19/2012   Toenail deformity 01/16/2012   Gout    Diabetic neuropathy ([place]) 03/31/2008   ALCOHOLISM 03/31/2008   Rosacea 03/31/2008   GERD 01/31/2008   COPD (chronic obstructive pulmonary disease) ([place]) 51/11/5850   DM W/COMPLICATION NOS, TYPE II 05/08/2007   OBESITY 05/08/2007   HTN (hypertension) 05/08/2007   HERNIATED [place] 05/08/2007   HISTORY OF ASBESTOS EXPOSURE 05/08/2007   Chronic lower back pain on narcotics  06/16/2005   HLD (hyperlipidemia) 03/22/2002    Past Surgical History:  Procedure Laterality Date   AMPUTATION Left 03/28/2016   Procedure: AMPUTATION left foot 4th and 5 th ray;  Surgeon: Newt Minion, MD;   Location: [place];  Service: Orthopedics;  Laterality: Left;   AMPUTATION Left 04/11/2016   Procedure: LEFT BELOW KNEE AMPUTATION;  Surgeon: Newt Minion, MD;  Location: [place];  Service: Orthopedics;  Laterality: Left;   AMPUTATION Right 01/22/2018   Procedure: RIGHT FOOT 3RD RAY AMPUTATION;  Surgeon: Newt Minion, MD;  Location: [place];  Service: Orthopedics;  Laterality: Right;   AMPUTATION Right 02/19/2018   Procedure: RIGHT TRANSMETATARSAL AMPUTATION;  Surgeon: Newt Minion, MD;  Location: [place];  Service: Orthopedics;  Laterality: Right;   AMPUTATION Right 03/28/2018   Procedure: RIGHT BELOW KNEE AMPUTATION;  Surgeon: Newt Minion, MD;  Location: [place];  Service: Orthopedics;  Laterality: Right;   AMPUTATION Left 09/11/2021   Procedure: LEFT ABOVE KNEE AMPUTATION;  Surgeon: Newt Minion, MD;  Location: [place];  Service: Orthopedics;  Laterality: Left;   BIOPSY  08/25/2021   Procedure: BIOPSY;  Surgeon: Daryel November, MD;  Location: [facility] ENDOSCOPY;  Service: Gastroenterology;;   ESOPHAGOGASTRODUODENOSCOPY Left 09/05/2021   Procedure: ESOPHAGOGASTRODUODENOSCOPY (EGD);  Surgeon: Lavena Bullion, DO;  Location: [facility] ENDOSCOPY;  Service: Gastroenterology;  Laterality: Left;   ESOPHAGOGASTRODUODENOSCOPY (EGD) WITH PROPOFOL N/A 08/25/2021   Procedure: ESOPHAGOGASTRODUODENOSCOPY (EGD) WITH PROPOFOL;  Surgeon: Daryel November, MD;  Location: [place];  Service: Gastroenterology;  Laterality: N/A;   HEMOSTASIS CLIP PLACEMENT  09/05/2021   Procedure: HEMOSTASIS CLIP PLACEMENT;  Surgeon: Lavena Bullion, DO;  Location: [place];  Service: Gastroenterology;;   HOT HEMOSTASIS  08/25/2021   Procedure: HOT HEMOSTASIS (ARGON PLASMA COAGULATION/BICAP);  Surgeon: Daryel November, MD;  Location: [facility] ENDOSCOPY;  Service: Gastroenterology;;   HOT HEMOSTASIS N/A 09/05/2021   Procedure: HOT HEMOSTASIS (ARGON PLASMA COAGULATION/BICAP);  Surgeon: Lavena Bullion, DO;  Location: [facility]  ENDOSCOPY;  Service: Gastroenterology;  Laterality: N/A;   I & D EXTREMITY Left 08/19/2021   Procedure: IRRIGATION AND DEBRIDEMENT LEFT KNEE;  Surgeon: Paralee Cancel, MD;  Location: [place];  Service: Orthopedics;  Laterality: Left;   I & D EXTREMITY Left 08/29/2021   Procedure: IRRIGATION AND DEBRIDEMENT Left Knee;  Surgeon: Paralee Cancel, MD;  Location: [place];  Service: Orthopedics;  Laterality: Left;   LEG AMPUTATION BELOW KNEE Left 04/11/2016   LOWER EXTREMITY ANGIOGRAM N/A 04/18/2013   Procedure: LOWER EXTREMITY ANGIOGRAM;  Surgeon: Sherren Mocha, MD;  Location: [facility] CATH LAB;  Service: Cardiovascular;  Laterality: N/A;   LOWER EXTREMITY ANGIOGRAM  08/13/2015   bilateral iliac    PERIPHERAL VASCULAR CATHETERIZATION Bilateral 08/13/2015   Procedure: Lower Extremity Angiography;  Surgeon: Lorretta Harp, MD;  Location: [place] CV LAB;  Service: Cardiovascular;  Laterality: Bilateral;   PERIPHERAL VASCULAR CATHETERIZATION N/A 08/13/2015   Procedure: Abdominal Aortogram;  Surgeon: Lorretta Harp, MD;  Location: [place] CV LAB;  Service: Cardiovascular;  Laterality: N/A;   SCLEROTHERAPY  08/25/2021   Procedure: SCLEROTHERAPY;  Surgeon: Daryel November, MD;  Location: [facility] ENDOSCOPY;  Service: Gastroenterology;;   Clide Deutscher  09/05/2021   Procedure: Clide Deutscher;  Surgeon: Lavena Bullion, DO;  Location: [facility] ENDOSCOPY;  Service: Gastroenterology;;   TOE AMPUTATION Right 01/22/2018   3RD TOE RIGHT FOOT       Family History  Problem Relation Age of Onset   Diabetes Sister    Hypertension Mother     Social History   Tobacco Use   Smoking status: Former    Packs/day: 2.00    Years: 40.00    Pack years: 80.00    Types: Cigarettes    Quit date: 01/13/2002    Years since quitting: 19.7   Smokeless tobacco: Current    Types: Chew   Tobacco comments:    chews a little tobacco  Vaping Use   Vaping Use: Never used  Substance Use Topics   Alcohol use: Not Currently     Comment: 03/03/18: "Used to drink alot. No alcohol in a long time."   Drug use: No    Types: Marijuana    Comment: 03/27/2016 "last marijuana was maybe in 2013"    Home Medications Prior to Admission medications   Medication Sig Start Date End Date Taking? Authorizing Provider  acetaminophen (TYLENOL) 325 MG tablet Take 2 tablets (650 mg total) by mouth every 6 (six) hours as needed for mild pain (or Fever >/= 101). Patient taking differently: Take 650 mg by mouth every 8 (eight) hours as needed for mild pain (or Fever >/= 101). 03/30/18  Yes Marcelyn Bruins, MD  albuterol (PROVENTIL HFA;VENTOLIN HFA) 108 (90 Base) MCG/ACT inhaler Inhale 2 puffs into the lungs 4 (four) times daily as needed for wheezing or shortness of breath.    Yes [provider]  albuterol (PROVENTIL) (2.5 MG/3ML) 0.083% nebulizer solution Take 2.5 mg by nebulization every 6 (six) hours as needed for wheezing or shortness of breath.   Yes [provider]  allopurinol (ZYLOPRIM) 100 MG tablet Take 100 mg by mouth at bedtime. For gout   Yes [provider]  AMINO ACIDS-PROTEIN HYDROLYS PO Take 1 Dose by mouth 2 (two) times daily. Add SF Pro-Stat   Yes [provider]  amLODipine (NORVASC) 10 MG tablet Take 1 tablet (10 mg total) by mouth daily. 08/09/12  Yes Funches, Adriana Mccallum, MD  aspirin 81 MG chewable tablet Chew 81 mg by mouth daily.   Yes [provider]  calcium carbonate (TUMS EX) 750 MG chewable tablet Chew 1 tablet by mouth 2 (two) times daily as needed for heartburn.   Yes [provider]  Cholecalciferol (VITAMIN D-3) 1000 units CAPS Take 1,000 Units by mouth daily.   Yes [provider]  ciprofloxacin (CIPRO) 500 MG tablet Take 500 mg by mouth 2 (two) times daily. Every 14 (fourteen) days 10/03/21 10/17/21 Yes [provider]  escitalopram (LEXAPRO) 10 MG tablet Take 10 mg by mouth daily.   Yes [provider]  Evolocumab with Infusor  ([place]) 420 MG/3.5ML SOCT Inject 420 mg into the skin every 30 (thirty) days.   Yes [provider]  famotidine (PEPCID) 20 MG tablet Take 20 mg by mouth at bedtime.   Yes [provider]  ferrous sulfate 325 (65 FE) MG tablet Take 325 mg by mouth every other day.   Yes [provider]  insulin glargine (LANTUS) 100 UNIT/ML injection Inject 75 Units into  the skin daily.   Yes [provider]  LORazepam (ATIVAN) 0.5 MG tablet Take 0.25 mg by mouth daily as needed for anxiety (x2 months). 07/30/21 10/22/21 Yes [provider]  losartan-hydrochlorothiazide (HYZAAR) 100-25 MG tablet Take 1 tablet by mouth daily.   Yes [provider]  Melatonin 10 MG TABS Take 10 mg by mouth at bedtime.   Yes [provider]  metFORMIN (GLUCOPHAGE) 850 MG tablet Take 850 mg by mouth 2 (two) times daily with a meal.   Yes [provider]  metoprolol (TOPROL-XL) 200 MG 24 hr tablet Take 200 mg by mouth daily. Take with or immediately following a meal.   Yes [provider]  Multiple Vitamins-Minerals (CERTAVITE SENIOR/ANTIOXIDANT) TABS Take 1 tablet by mouth daily.   Yes [provider]  nitroGLYCERIN (NITROSTAT) 0.4 MG SL tablet Place 0.4 mg under the tongue every 5 (five) minutes x 3 doses as needed for chest pain.  11/12/11  Yes Funches, Josalyn, MD  oxyCODONE (OXY IR/ROXICODONE) 5 MG immediate release tablet Take 1-2 tablets (5-10 mg total) by mouth every 4 (four) hours as needed for moderate pain (pain score 4-6). 09/13/21  Yes Timothy Lasso, MD  senna-docusate (SENOKOT-S) 8.6-50 MG tablet Take 2 tablets by mouth daily.   Yes [provider]  Skin Protectants, Misc. (MINERIN) CREA Apply 1 application topically 2 (two) times daily as needed (dryness).   Yes [provider]  traZODone (DESYREL) 50 MG tablet Take 50 mg by mouth at bedtime.   Yes [provider]  vitamin B-12  (CYANOCOBALAMIN) 1000 MCG tablet Take 1,000 mcg by mouth daily.   Yes [provider]  ascorbic acid (VITAMIN C) 1000 MG tablet Take 1 tablet (1,000 mg total) by mouth daily. Patient not taking: Reported on 10/04/2021 09/14/21   Gaylan Gerold, DO  carbamazepine (TEGRETOL) 200 MG tablet Take 1 tablet (200 mg total) by mouth daily at 12 noon. Patient not taking: Reported on 10/04/2021 09/14/21   Gaylan Gerold, DO  DULoxetine (CYMBALTA) 60 MG capsule Take 60 mg by mouth daily. Patient not taking: Reported on 08/22/2021    [provider]  folic acid (FOLVITE) 1 MG tablet Take 1 tablet (1 mg total) by mouth daily. 09/14/21   Gaylan Gerold, DO  hydrALAZINE (APRESOLINE) 25 MG tablet Take 1 tablet (25 mg total) by mouth every 8 (eight) hours. 09/13/21   Gaylan Gerold, DO  losartan (COZAAR) 100 MG tablet Take 100 mg by mouth daily. Patient not taking: Reported on 08/22/2021    [provider]  metoprolol succinate (TOPROL-XL) 100 MG 24 hr tablet Take 1 tablet (100 mg total) by mouth daily. Take with or immediately following a meal. Patient not taking: Reported on 10/04/2021 09/14/21 10/14/21  Gaylan Gerold, DO  mirtazapine (REMERON) 7.5 MG tablet Take 7.5 mg by mouth at bedtime. Patient not taking: Reported on 08/22/2021    [provider]  oxyCODONE 10 MG TABS Take 1-1.5 tablets (10-15 mg total) by mouth every 4 (four) hours as needed for severe pain (pain score 7-10). Patient not taking: Reported on 10/04/2021 09/13/21   Timothy Lasso, MD  sucralfate (CARAFATE) 1 g tablet Take 1 tablet (1 g total) by mouth 4 (four) times daily -  with meals and at bedtime. Patient not taking: Reported on 10/04/2021 09/13/21   Gaylan Gerold, DO    Allergies    Atorvastatin, Statins, and Flexeril [cyclobenzaprine]  Review of Systems   Review of Systems  Constitutional:  Positive for  fatigue. Negative for chills and fever.  HENT:  Negative for ear pain and sore throat.   Eyes:  Negative  for pain and visual disturbance.  Respiratory:  Negative for cough and shortness of breath.   Cardiovascular:  Negative for chest pain and palpitations.  Gastrointestinal:  Negative for abdominal pain and vomiting.  Genitourinary:  Negative for dysuria and hematuria.  Musculoskeletal:  Negative for arthralgias and back pain.  Skin:  Negative for color change and rash.  Neurological:  Positive for weakness. Negative for seizures and syncope.  All other systems reviewed and are negative.  Physical Exam Updated Vital Signs BP (!) 109/50 (BP Location: Left Arm)   Pulse 66   Temp 98.7 F (37.1 C) (Oral)   Resp 16   Wt 81.8 kg   SpO2 99%   BMI 29.11 kg/m   Physical Exam Vitals and nursing note reviewed.  Constitutional:      General: He is not in acute distress.    Appearance: Normal appearance. He is well-developed. He is ill-appearing (chronically).  HENT:     Head: Normocephalic and atraumatic.     Right Ear: External ear normal.     Left Ear: External ear normal.     Nose: Nose normal. No congestion.     Mouth/Throat:     Mouth: Mucous membranes are dry.     Pharynx: Oropharynx is clear. No posterior oropharyngeal erythema.  Eyes:     Extraocular Movements: Extraocular movements intact.     Conjunctiva/sclera: Conjunctivae normal.     Pupils: Pupils are equal, round, and reactive to light.  Cardiovascular:     Rate and Rhythm: Normal rate and regular rhythm.     Pulses: Normal pulses.     Heart sounds: No murmur heard. Pulmonary:     Effort: Pulmonary effort is normal. No respiratory distress.     Breath sounds: Normal breath sounds. No wheezing, rhonchi or rales.  Abdominal:     General: Abdomen is flat. Bowel sounds are normal.     Palpations: Abdomen is soft.     Tenderness: There is no abdominal tenderness. There is no guarding or rebound.     Comments: Foley catheter in place, draining urine  Musculoskeletal:        General: No swelling, tenderness or deformity.  Normal range of motion.     Cervical back: Normal range of motion and neck supple. No rigidity.     Comments: Status post bilateral AKA's  Skin:    General: Skin is warm and dry.     Capillary Refill: Capillary refill takes less than 2 seconds.     Findings: No rash.  Neurological:     General: No focal deficit present.     Mental Status: He is alert and oriented to person, place, and time.     Cranial Nerves: No cranial nerve deficit.     Sensory: No sensory deficit.  Psychiatric:        Mood and Affect: Mood normal.    ED Results / Procedures / Treatments   Labs (all labs ordered are listed, but only abnormal results are displayed) Labs Reviewed  CBC WITH DIFFERENTIAL/PLATELET - Abnormal; Notable for the following components:      Result Value   WBC 10.7 (*)    RBC 2.44 (*)    Hemoglobin 7.0 (*)    HCT 23.1 (*)    RDW 17.9 (*)    Monocytes Absolute 1.1 (*)    All other components within  normal limits  COMPREHENSIVE METABOLIC PANEL - Abnormal; Notable for the following components:   Sodium 130 (*)    CO2 20 (*)    Glucose, Bld 68 (*)    BUN 86 (*)    Creatinine, Ser 2.01 (*)    Calcium 8.4 (*)    Albumin 2.2 (*)    GFR, Estimated 34 (*)    All other components within normal limits  URINALYSIS, ROUTINE W REFLEX MICROSCOPIC - Abnormal; Notable for the following components:   Color, Urine AMBER (*)    APPearance CLOUDY (*)    Hgb urine dipstick MODERATE (*)    Protein, ur 30 (*)    Leukocytes,Ua LARGE (*)    WBC, UA >50 (*)    Bacteria, UA MANY (*)    All other components within normal limits  PROTIME-INR - Abnormal; Notable for the following components:   Prothrombin Time 16.0 (*)    INR 1.3 (*)    All other components within normal limits  APTT - Abnormal; Notable for the following components:   aPTT 50 (*)    All other components within normal limits  I-STAT CHEM 8, ED - Abnormal; Notable for the following components:   Sodium 133 (*)    BUN 106 (*)     Creatinine, Ser 2.00 (*)    Glucose, Bld 65 (*)    TCO2 21 (*)    Hemoglobin 7.5 (*)    HCT 22.0 (*)    All other components within normal limits  I-STAT VENOUS BLOOD GAS, ED - Abnormal; Notable for the following components:   pCO2, Ven 40.6 (*)    pO2, Ven 52.0 (*)    Acid-base deficit 5.0 (*)    HCT 22.0 (*)    Hemoglobin 7.5 (*)    All other components within normal limits  CBG MONITORING, ED - Abnormal; Notable for the following components:   Glucose-Capillary 55 (*)    All other components within normal limits  CBG MONITORING, ED - Abnormal; Notable for the following components:   Glucose-Capillary 122 (*)    All other components within normal limits  CBG MONITORING, ED - Abnormal; Notable for the following components:   Glucose-Capillary 114 (*)    All other components within normal limits  RESP PANEL BY RT-PCR (FLU A&B, COVID) ARPGX2  CULTURE, BLOOD (ROUTINE X 2)  CULTURE, BLOOD (ROUTINE X 2)  URINE CULTURE  LACTIC ACID, PLASMA  FERRITIN  IRON AND TIBC  POC OCCULT BLOOD, ED  CBG MONITORING, ED  TYPE AND SCREEN  PREPARE RBC (CROSSMATCH)    EKG EKG Interpretation  Date/Time:  Friday October 04 2021 15:57:08 EST Ventricular Rate:  62 PR Interval:  248 QRS Duration: 95 QT Interval:  504 QTC Calculation: 512 R Axis:   71 Text Interpretation: Sinus rhythm Prolonged PR interval Repol abnrm, severe global ischemia (LM/MVD) Prolonged QT interval Confirmed by Nanda Quinton [phone]) on 10/04/2021 4:02:12 PM  Radiology DG Chest Portable 1 View  Result Date: 10/04/2021 CLINICAL DATA:  Low hemoglobin.  UTI. EXAM: PORTABLE CHEST 1 VIEW COMPARISON:  08/18/2021 FINDINGS: 1549 hours. The lungs are clear without focal pneumonia, edema, pneumothorax or pleural effusion. Cardiopericardial silhouette is at upper limits of normal for size. The visualized bony structures of the thorax show no acute abnormality. Telemetry leads overlie the chest. IMPRESSION: No active disease.  Electronically Signed   By: Misty Stanley M.D.   On: 10/04/2021 16:03    Procedures Procedures   Medications Ordered in ED Medications  sodium chloride flush (NS) 0.9 % injection 3 mL (has no administration in time range)  sodium chloride flush (NS) 0.9 % injection 3 mL (3 mLs Intravenous Not Given 10/04/21 2004)  0.9 %  sodium chloride infusion (0 mLs Intravenous Hold 10/04/21 2004)  0.9 %  sodium chloride infusion (10 mL/hr Intravenous Not Given 10/04/21 1628)  enoxaparin (LOVENOX) injection 30 mg (30 mg Subcutaneous Given 10/04/21 2035)  acetaminophen (TYLENOL) tablet 650 mg (has no administration in time range)    Or  acetaminophen (TYLENOL) suppository 650 mg (has no administration in time range)  cefTRIAXone (ROCEPHIN) 1 g in sodium chloride 0.9 % 100 mL IVPB (has no administration in time range)  allopurinol (ZYLOPRIM) tablet 100 mg (has no administration in time range)  carbamazepine (TEGRETOL) tablet 200 mg (has no administration in time range)  DULoxetine (CYMBALTA) DR capsule 60 mg (0 mg Oral Hold 10/04/21 2102)  LORazepam (ATIVAN) tablet 0.25 mg (has no administration in time range)  mirtazapine (REMERON) tablet 7.5 mg (has no administration in time range)  oxyCODONE (Oxy IR/ROXICODONE) immediate release tablet 5 mg (5 mg Oral Given 10/04/21 2034)  traZODone (DESYREL) tablet 50 mg (50 mg Oral Given 10/04/21 2030)  pantoprazole (PROTONIX) EC tablet 40 mg (40 mg Oral Given 10/04/21 2031)  insulin aspart (novoLOG) injection 0-9 Units (has no administration in time range)  lactated ringers infusion ( Intravenous New Bag/Given 10/04/21 2103)  albuterol (PROVENTIL) (2.5 MG/3ML) 0.083% nebulizer solution 2.5 mg (has no administration in time range)  sodium chloride 0.9 % bolus 1,000 mL (0 mLs Intravenous Stopped 10/04/21 1748)  ceFEPIme (MAXIPIME) 2 g in sodium chloride 0.9 % 100 mL IVPB (0 g Intravenous Stopped 10/04/21 1748)  lactated ringers bolus 1,000 mL (1,000 mLs Intravenous New  Bag/Given 10/04/21 2104)    ED Course  I have reviewed the triage vital signs and the nursing notes.  Pertinent labs & imaging results that were available during my care of the patient were reviewed by me and considered in my medical decision making (see chart for details).    MDM Rules/Calculators/A&P                          75 year old gentleman past medical history significant for hypertension, type 2 diabetes, COPD, left AKA.  He has a chronic Foley catheter and was diagnosed with a UTI yesterday after noticing some suprapubic pain.  He was started on Cipro, but has had progressive weakness since.  He was found to be anemic to 7.3 and had significant leukocytosis on outpatient labs earlier this morning.  He was sent for sepsis evaluation.  He is afebrile on arrival and hemodynamically stable.  He was given IV fluids pending further evaluation. UA today is concerning for acute infection.  He has no CVA tenderness on exam.  Low concern for pyelo or renal abscess.  White count elevated at 11. Will empirically treat with cefepime.  Cultures pending. Hemoglobin today 7.0.  He is having no signs or symptoms of GI bleed.  His Hemoccult is negative.  Due to significant weakness and global ST changes on EKG, will transfuse 1 unit of PRBCs for symptomatic anemia.  Patient was found to be hypoglycemic to 55.  May be secondary to acute infection.  He was given p.o. glucose and placed on hypoglycemia protocol.  Medicine team contacted for admission. Hand off given.    Final Clinical Impression(s) / ED Diagnoses Final diagnoses:  Lower urinary tract infectious disease  Generalized weakness  AKI (acute kidney injury) [facility])    Rx / DC Orders ED Discharge Orders     None        Idamae Lusher, MD 10/04/21 2149    Margette Fast, MD 10/05/21 1325

## 2021-10-04 NOTE — ED Notes (Signed)
Assumed care on patient , 1st uit PRBC infusing at 250 ml/hr , no adverse effect , VSS /afebrile , respirations unlabored , IV site unremarkable .

## 2021-10-05 ENCOUNTER — Inpatient Hospital Stay (HOSPITAL_COMMUNITY): Payer: Medicare (Managed Care)

## 2021-10-05 DIAGNOSIS — R339 Retention of urine, unspecified: Secondary | ICD-10-CM | POA: Diagnosis present

## 2021-10-05 DIAGNOSIS — D649 Anemia, unspecified: Secondary | ICD-10-CM

## 2021-10-05 DIAGNOSIS — K269 Duodenal ulcer, unspecified as acute or chronic, without hemorrhage or perforation: Secondary | ICD-10-CM

## 2021-10-05 DIAGNOSIS — E162 Hypoglycemia, unspecified: Secondary | ICD-10-CM | POA: Diagnosis present

## 2021-10-05 LAB — BASIC METABOLIC PANEL
Anion gap: 10 (ref 5–15)
BUN: 70 mg/dL — ABNORMAL HIGH (ref 8–23)
CO2: 19 mmol/L — ABNORMAL LOW (ref 22–32)
Calcium: 8.1 mg/dL — ABNORMAL LOW (ref 8.9–10.3)
Chloride: 105 mmol/L (ref 98–111)
Creatinine, Ser: 1.62 mg/dL — ABNORMAL HIGH (ref 0.61–1.24)
GFR, Estimated: 44 mL/min — ABNORMAL LOW (ref >60)
Glucose, Bld: 150 mg/dL — ABNORMAL HIGH (ref 70–99)
Potassium: 4.2 mmol/L (ref 3.5–5.1)
Sodium: 134 mmol/L — ABNORMAL LOW (ref 135–145)

## 2021-10-05 LAB — CBC
HCT: 24.3 % — ABNORMAL LOW (ref 39.0–52.0)
Hemoglobin: 7.6 g/dL — ABNORMAL LOW (ref 13.0–17.0)
MCH: 28.7 pg (ref 26.0–34.0)
MCHC: 31.3 g/dL (ref 30.0–36.0)
MCV: 91.7 fL (ref 80.0–100.0)
Platelets: 202 10*3/uL (ref 150–400)
RBC: 2.65 MIL/uL — ABNORMAL LOW (ref 4.22–5.81)
RDW: 19.2 % — ABNORMAL HIGH (ref 11.5–15.5)
WBC: 7 10*3/uL (ref 4.0–10.5)
nRBC: 0 % (ref 0.0–0.2)

## 2021-10-05 LAB — CBG MONITORING, ED
Glucose-Capillary: 131 mg/dL — ABNORMAL HIGH (ref 70–99)
Glucose-Capillary: 149 mg/dL — ABNORMAL HIGH (ref 70–99)
Glucose-Capillary: 155 mg/dL — ABNORMAL HIGH (ref 70–99)
Glucose-Capillary: 158 mg/dL — ABNORMAL HIGH (ref 70–99)
Glucose-Capillary: 34 mg/dL — CL (ref 70–99)
Glucose-Capillary: 50 mg/dL — ABNORMAL LOW (ref 70–99)
Glucose-Capillary: 80 mg/dL (ref 70–99)
Glucose-Capillary: 96 mg/dL (ref 70–99)

## 2021-10-05 LAB — GLUCOSE, CAPILLARY: Glucose-Capillary: 121 mg/dL — ABNORMAL HIGH (ref 70–99)

## 2021-10-05 MED ORDER — ESCITALOPRAM OXALATE 10 MG PO TABS
10.0000 mg | ORAL_TABLET | Freq: Every day | ORAL | Status: DC
  Administered 2021-10-06 – 2021-10-07 (×2): 10 mg via ORAL
  Filled 2021-10-05 (×2): qty 1

## 2021-10-05 MED ORDER — DEXTROSE 50 % IV SOLN
INTRAVENOUS | Status: AC
  Administered 2021-10-05: 50 mL
  Filled 2021-10-05: qty 50

## 2021-10-05 MED ORDER — DEXTROSE 50 % IV SOLN
INTRAVENOUS | Status: AC
  Filled 2021-10-05: qty 50

## 2021-10-05 MED ORDER — CHLORHEXIDINE GLUCONATE CLOTH 2 % EX PADS
6.0000 | MEDICATED_PAD | Freq: Every day | CUTANEOUS | Status: DC
  Administered 2021-10-06 – 2021-10-07 (×2): 6 via TOPICAL

## 2021-10-05 MED ORDER — PANTOPRAZOLE SODIUM 40 MG PO TBEC
40.0000 mg | DELAYED_RELEASE_TABLET | Freq: Two times a day (BID) | ORAL | Status: DC
  Administered 2021-10-05 – 2021-10-07 (×4): 40 mg via ORAL
  Filled 2021-10-05 (×4): qty 1

## 2021-10-05 MED ORDER — DEXTROSE IN LACTATED RINGERS 5 % IV SOLN: INTRAVENOUS | Status: DC

## 2021-10-05 NOTE — ED Notes (Signed)
Patient given 2nd dose of D50% 1 ampule for hypoglycemia , juice given to patient , denies pain /respirations unlabored . ASmitting MD paged by secretary for update .

## 2021-10-05 NOTE — ED Notes (Signed)
To ultrasound

## 2021-10-05 NOTE — ED Notes (Addendum)
1ampule D50% IV / orange juice given , admitting MD notified on hypoglycemia.

## 2021-10-05 NOTE — ED Notes (Signed)
Admitting MD notified on patient's hypoglycemia.  

## 2021-10-05 NOTE — Progress Notes (Addendum)
Subjective:  Overnight patient noted to be hyperglycemic to the 30s and 40s.  He was started around 4 AM on D5 LR.  With improvement in his CBGs.    Patient evaluated at bedside.  States he is feeling well. Sate dysuria has resolved. Denies any further urinary symptoms.  Patient with no documented history of CKD appears denies any chest pain, abdominal pain, nausea, vomiting, dark stools.  He is unsure when he last had a bowel movement.  States he feels that he is at his baseline today.  No acute complaints.  Objective:  Vital signs in last 24 hours: Vitals:   10/05/21 0445 10/05/21 0545 10/05/21 0600 10/05/21 0615  BP: (!) 121/43 (!) 123/55 (!) 127/45 (!) 122/42  Pulse: 68 71 69 70  Resp: 14 19 18 16   Temp:      TempSrc:      SpO2: 99% 100% 99% 100%  Weight:       Constitutional: Obese chronically ill-appearing.  In no acute distress HENT: Normocephalic and atraumatic, dry mucous membranes Cardiovascular: Normal rate, regular rhythm, S1 and S2 present, no murmurs, rubs, gallops.  Distal pulses intact Respiratory: No respiratory distress, no accessory muscle use.  Effort is normal.  Lungs are clear to auscultation bilaterally. GI: Nondistended, soft, nontender to palpation, normal active bowel sounds Musculoskeletal: Right BKA.  Left AKA surgical incisions appear to be healing well.  No erythema, drainage, bleeding.  Distal extremities appear to be warm and well-perfused. Neurological: Is alert and oriented x4, no apparent focal deficits noted. Skin: Warm and dry.  No rash, erythema, lesions noted. Psychiatric: Normal mood and affect. Behavior is normal. Judgment and thought content normal.   Assessment/Plan:  Principal Problem:   Symptomatic anemia   Leslie [name] is a 75 y.o. with pertinent PMH of T2DM c/b neuropathy, HLD, HTN, COPD, sacral pressure ulcer,  bilateral BKAs,  recent upper GI bleed 2/2 duodenal ulcer, and an indwelling foley who presented with burning with  urination and admitted for UTI and symptomatic anemia complicated by acute on chronic kidney disease   #Hypoglycemia #DMII Persistent hypoglycemia during this admission with CBGs in the 30s to 40s overnight.  He was started on 5% LR infusion overnight with improvement in his CBGs.  Likely hypoglycemic in the setting of worsening kidney function and large dose of home long acting insulin.  Home diabetes regimen of Semglee 75 units daily in addition to metformin and 50 mg daily.  Does not appear that he receives mealtime insulin.  Last A1c in 10/24 was 5.8.  Given his inconsistent p.o. intake and lack of appetite would decrease his long-acting to 28 units daily and 10 units with meals once CBG stabilized. -Continues D5 LR at 128ml/h -CBG every 4 hours -We will likely transition to Semglee 20 units daily 10 units with meals once CBGs normalized   #Acute on chronic kidney disease Likely in setting of poor p.o. intake in addition underlying chronic kidney disease in the setting of diabetes and hypertension. Creatinine 1.2 at baseline.  Creatinine of 2 on admission with BUN elevated at 106.  Improved today to BUN 17 with creatinine of 1.62 with GFR of 44.  Likely GFR is overestimated in the setting of bilateral leg amputations and low muscle mass.   -Renal ultrasound - avoid nephrotoxic medications - daily bmp  #Normocytic Anemia  Patient had a recent upper GI bleed due a duodenal ulcer at a prior admission. Heme occult in the ED was negative, however hemoglobin  on admission was 7.0.  Now improved to 7.6 after 1 unit of packed red cells.  Does not appear that he is on a PPI at home.  Ferritin of 149 with iron of 45 and TIBC of 200.  Likely has component of anemia of chronic renal disease and chronic inflammation contributing to his anemia.  Patient denies any symptoms of GI bleeding.  GI evaluated and did not feel there is concern for acute bleeding or need for endoscopy at this time. - Continue Protonix  twice daily - daily CBC - transfuse for hemoglobin less than 7  #Suspected UTI in the setting of indwelling foley Patient coming in with dysuria in the setting of an indwelling foley placed on 12/6 due to concerns for urinary retention.  UA with moderate hemoglobin, large leukocytes, many bacteria.  Urinary symptoms have since resolved.  Has gotten 3 days of antibiotics.  Will discontinue antibiotics given symptoms improved.   #GERD Continue Protonix BID  #PAD s/p right BKA and left AKA Patient with history of PAD and diabetic ulcers requiring bilateral lower extremity amputations.  Most recently was admitted in October to November for septic arthritis of the left knee ultimately requiring left AKA.  His wound has been healing well.  [facility] noted that he has had worsening cognition and deconditioning since his most recent hospitalization last month.  Patient lives alone and has little social support and difficult home situation. -PT OT -continue aspirin   #COPD -continue albuterol inhaler   #Sacral Ulcer --Continue wound care --Monitor for worsening condition   #HTN #HLD Holding home BP meds at this time 2/2 hypotension and AKI.  We will both occlude no open wound.  10 mg daily, Hyzaar 100-25 mg daily and metoprolol 200 mg daily.  Has history of statin intolerance on Repatha late last dose was 12/5.   #Gout Continue allopurinol   #Depression - continue trazadone and Lexapro 10 mg daily   #Hyponatremia Chronic, stable --Daily BMP   Prior to Admission Living Arrangement: SNF Anticipated Discharge Location:SNF Barriers to Discharge:Medical stability Dispo: Anticipated discharge in approximately 1-2 day(s).   Leslie Beard, MD 10/05/2021, 6:50 AM Pager: [phone] After 5pm on weekdays and 1pm on weekends: On Call pager [phone]

## 2021-10-05 NOTE — ED Notes (Signed)
[id number] nurse notified

## 2021-10-05 NOTE — ED Notes (Signed)
Charlynn Grimes (brother) of Micahel called and ask for an update. Call back at [phone].

## 2021-10-05 NOTE — Evaluation (Signed)
Occupational Therapy Evaluation Patient Details Name: Leslie [name] MRN: [id number] DOB: [birth year] Today's Date: 10/05/2021   History of Present Illness 75 year old male with past history of hypertension, hyperlipidemia, type 2 diabetes, COPD, PVD s/p right post left AKA, staples removed, and old R BKA, alcohol use disorder here for weakness.  Has c/o burning with urination, urinary retention for the past couple of days.  He has a Foley, UA was concerning for UTI.  Recently at SNF undergoing ST rehab.   Clinical Impression   Patient admitted for the diagnosis above.  Patient normally lives in a one room apartment, has a ramped entrance, has an Transport planner, has assist from [facility]; but currently is undergoing ST rehab at a local SNF post revision of his L BKA, to a L AKA.  He does has a R prosthetic, but it is at the facility.  Currently he is needing up to Mod I for rolling, and Mod A for bed mobility at stretcher level.  OT will follow in the acute setting to maximize his functional status, but the plan is to return to SNF until he can return home.        Recommendations for follow up therapy are one component of a multi-disciplinary discharge planning process, led by the attending physician.  Recommendations may be updated based on patient status, additional functional criteria and insurance authorization.   Follow Up Recommendations  Skilled nursing-short term rehab (<3 hours/day)    Assistance Recommended at Discharge Frequent or constant Supervision/Assistance  Functional Status Assessment  Patient has had a recent decline in their functional status and demonstrates the ability to make significant improvements in function in a reasonable and predictable amount of time.  Equipment Recommendations  None recommended by OT    Recommendations for Other Services       Precautions / Restrictions Precautions Precautions: Fall Precaution Comments: chronic R BKA (RLE prosthetic  at SNF) now s/p L AKA.      Mobility Bed Mobility Overal bed mobility: Needs Assistance Bed Mobility: Rolling;Supine to Sit;Sit to Supine Rolling: Modified independent (Device/Increase time)   Supine to sit: Mod assist;HOB elevated Sit to supine: HOB elevated;Min assist        Transfers                          Balance Overall balance assessment: Needs assistance;History of Falls Sitting-balance support: Feet unsupported;Bilateral upper extremity supported Sitting balance-Leahy Scale: Fair                                     ADL either performed or assessed with clinical judgement   ADL   Eating/Feeding: Set up;Bed level   Grooming: Set up;Bed level;Wash/dry face;Wash/dry hands   Upper Body Bathing: Minimal assistance;Bed level   Lower Body Bathing: Moderate assistance;Bed level   Upper Body Dressing : Minimal assistance;Bed level   Lower Body Dressing: Moderate assistance;Bed level                       Vision Patient Visual Report: No change from baseline       Perception Perception Perception: Not tested   Praxis Praxis Praxis: Not tested    Pertinent Vitals/Pain Pain Assessment: No/denies pain     Hand Dominance Right   Extremity/Trunk Assessment Upper Extremity Assessment Upper Extremity Assessment: Overall WFL for tasks assessed  Lower Extremity Assessment Lower Extremity Assessment: Defer to PT evaluation   Cervical / Trunk Assessment Cervical / Trunk Assessment: Kyphotic   Communication Communication Communication: No difficulties   Cognition Arousal/Alertness: Awake/alert Behavior During Therapy: Flat affect Overall Cognitive Status: No family/caregiver present to determine baseline cognitive functioning                                       General Comments   VSS    Exercises     Shoulder Instructions      Home Living Family/patient expects to be discharged to:: Skilled  nursing facility                                        Prior Functioning/Environment               Mobility Comments: mostly w/c level. assist with anterior/posterion transfers to w/c ADLs Comments: Assist with self care at SNF        OT Problem List: Decreased activity tolerance;Impaired balance (sitting and/or standing)      OT Treatment/Interventions:      OT Goals(Current goals can be found in the care plan section) Acute Rehab OT Goals Patient Stated Goal: I wanna go home, but they won't let me OT Goal Formulation: With patient Time For Goal Achievement: 10/19/21 Potential to Achieve Goals: Fair  OT Frequency:     Barriers to D/C:    None noted       Co-evaluation              AM-PAC OT "6 Clicks" Daily Activity     Outcome Measure Help from another person eating meals?: None Help from another person taking care of personal grooming?: None Help from another person toileting, which includes using toliet, bedpan, or urinal?: A Lot Help from another person bathing (including washing, rinsing, drying)?: A Lot Help from another person to put on and taking off regular upper body clothing?: A Lot Help from another person to put on and taking off regular lower body clothing?: A Lot 6 Click Score: 16   End of Session    Activity Tolerance: Patient tolerated treatment well Patient left: in bed;with call bell/phone within reach  OT Visit Diagnosis: Other abnormalities of gait and mobility (R26.89);Muscle weakness (generalized) (M62.81);History of falling (Z91.81)                Time: 6283-6629 OT Time Calculation (min): 17 min Charges:  OT General Charges $OT Visit: 1 Visit OT Evaluation $OT Eval Moderate Complexity: 1 Mod  10/05/2021  RP, OTR/L  Acute Rehabilitation Services  Office:  [phone]   Metta Clines 10/05/2021, 3:25 PM

## 2021-10-05 NOTE — Consult Note (Addendum)
Referring Provider: ? Primary Care Physician:  [facility] [place] Primary Gastroenterologist:  Althia Forts  Reason for Consultation:  Anemia  HPI: Leslie [name] is a 75 y.o. male with history of hypertension, hyperlipidemia, type 2 diabetes, COPD, PVD, s/p right BKA and recent admission in November for septic arthritis of the left knee status post left AKA, alcohol use disorder here for weakness.  Found to have a UTI and is being treated with antibiotics.  Hemoglobin is in the 7 g range, which is stable from 3 weeks ago.  He did receive 1 unit packed red blood cells here in the Ed.  Iron studies are okay.  He is heme-negative here.  He reports no sign of overt GI bleeding, but he says most of the time he just flushes it and does not pay much attention.  He was just seen by our service in November for gastrointestinal bleeding.  EGD 09/05/2021:  - Severe esophagitis with no bleeding. - Mild gastritis with luminal deformity of the distal stomach. The previously noted ulcers have since healed. - Active bleeding in the duodenal bulb. 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 This was successfully treated with a combination of bipolar cautery, hemostatic clips x3, then Epinephrine injection. - Non-bleeding duodenal ulcer with no stigmata of bleeding. - Normal second portion of the duodenum. - No specimens collected.  He does not provide much history and just says that he is tired of being in the hospital and wants to go home.  Past Medical History:  Diagnosis Date   Anxiety    Panic attack - 10  years aago- ? panic    Asthma    Cellulitis of left foot 2016   Healed and recurred 01/2016   Cellulitis of right foot 06/30/2012   Chronic lower back pain 07/01/2012   COPD (chronic obstructive pulmonary disease) ([place])    Uses nebulizers at home. Former smoker, current chewing tobacco. 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   GERD (gastroesophageal  reflux disease)    Gouty arthritis 07/01/2012   Not attacks in ~5 yrs (03/2016)   Hepatitis 1967   "in jail when I got it; dr said it was pretty bad; quaranteened X 30d"   Hyperlipidemia    Hypertension    Lung nodule, solitary 07/2015   Due for 1-yr recheck in 07/2017   Peripheral arterial disease ([place])    critical limb ischemia   Peripheral neuropathy 07/01/2012   Seasonal allergies    Skin growth 07/01/2012   right side of nares; "been on there 20 years"   Substance abuse ([place])    Type II diabetes mellitus ([place]) 2002   Diagnosed in 2002    Past Surgical History:  Procedure Laterality Date   AMPUTATION Left 03/28/2016   Procedure: AMPUTATION left foot 4th and 5 th ray;  Surgeon: Newt Minion, MD;  Location: [place];  Service: Orthopedics;  Laterality: Left;   AMPUTATION Left 04/11/2016   Procedure: LEFT BELOW KNEE AMPUTATION;  Surgeon: Newt Minion, MD;  Location: [place];  Service: Orthopedics;  Laterality: Left;   AMPUTATION Right 01/22/2018   Procedure: RIGHT FOOT 3RD RAY AMPUTATION;  Surgeon: Newt Minion, MD;  Location: [place];  Service: Orthopedics;  Laterality: Right;   AMPUTATION Right 02/19/2018   Procedure: RIGHT TRANSMETATARSAL AMPUTATION;  Surgeon: Newt Minion, MD;  Location: [place];  Service: Orthopedics;  Laterality: Right;   AMPUTATION Right 03/28/2018   Procedure: RIGHT BELOW KNEE AMPUTATION;  Surgeon: Newt Minion, MD;  Location: [place];  Service: Orthopedics;  Laterality: Right;   AMPUTATION Left 09/11/2021   Procedure: LEFT ABOVE KNEE AMPUTATION;  Surgeon: Newt Minion, MD;  Location: [place];  Service: Orthopedics;  Laterality: Left;   BIOPSY  08/25/2021   Procedure: BIOPSY;  Surgeon: Daryel November, MD;  Location: [facility] ENDOSCOPY;  Service: Gastroenterology;;   ESOPHAGOGASTRODUODENOSCOPY Left 09/05/2021   Procedure: ESOPHAGOGASTRODUODENOSCOPY (EGD);  Surgeon: Lavena Bullion, DO;  Location: [facility] ENDOSCOPY;  Service: Gastroenterology;  Laterality: Left;    ESOPHAGOGASTRODUODENOSCOPY (EGD) WITH PROPOFOL N/A 08/25/2021   Procedure: ESOPHAGOGASTRODUODENOSCOPY (EGD) WITH PROPOFOL;  Surgeon: Daryel November, MD;  Location: [place];  Service: Gastroenterology;  Laterality: N/A;   HEMOSTASIS CLIP PLACEMENT  09/05/2021   Procedure: HEMOSTASIS CLIP PLACEMENT;  Surgeon: Lavena Bullion, DO;  Location: [place];  Service: Gastroenterology;;   HOT HEMOSTASIS  08/25/2021   Procedure: HOT HEMOSTASIS (ARGON PLASMA COAGULATION/BICAP);  Surgeon: Daryel November, MD;  Location: [facility] ENDOSCOPY;  Service: Gastroenterology;;   HOT HEMOSTASIS N/A 09/05/2021   Procedure: HOT HEMOSTASIS (ARGON PLASMA COAGULATION/BICAP);  Surgeon: Lavena Bullion, DO;  Location: [facility] ENDOSCOPY;  Service: Gastroenterology;  Laterality: N/A;   I & D EXTREMITY Left 08/19/2021   Procedure: IRRIGATION AND DEBRIDEMENT LEFT KNEE;  Surgeon: Paralee Cancel, MD;  Location: [place];  Service: Orthopedics;  Laterality: Left;   I & D EXTREMITY Left 08/29/2021   Procedure: IRRIGATION AND DEBRIDEMENT Left Knee;  Surgeon: Paralee Cancel, MD;  Location: [place];  Service: Orthopedics;  Laterality: Left;   LEG AMPUTATION BELOW KNEE Left 04/11/2016   LOWER EXTREMITY ANGIOGRAM N/A 04/18/2013   Procedure: LOWER EXTREMITY ANGIOGRAM;  Surgeon: Sherren Mocha, MD;  Location: [facility] CATH LAB;  Service: Cardiovascular;  Laterality: N/A;   LOWER EXTREMITY ANGIOGRAM  08/13/2015   bilateral iliac    PERIPHERAL VASCULAR CATHETERIZATION Bilateral 08/13/2015   Procedure: Lower Extremity Angiography;  Surgeon: Lorretta Harp, MD;  Location: [place] CV LAB;  Service: Cardiovascular;  Laterality: Bilateral;   PERIPHERAL VASCULAR CATHETERIZATION N/A 08/13/2015   Procedure: Abdominal Aortogram;  Surgeon: Lorretta Harp, MD;  Location: [place] CV LAB;  Service: Cardiovascular;  Laterality: N/A;   SCLEROTHERAPY  08/25/2021   Procedure: SCLEROTHERAPY;  Surgeon: Daryel November, MD;  Location: [facility]  ENDOSCOPY;  Service: Gastroenterology;;   Clide Deutscher  09/05/2021   Procedure: Clide Deutscher;  Surgeon: Lavena Bullion, DO;  Location: [place] ENDOSCOPY;  Service: Gastroenterology;;   TOE AMPUTATION Right 01/22/2018   3RD TOE RIGHT FOOT    Prior to Admission medications   Medication Sig Start Date End Date Taking? Authorizing Provider  acetaminophen (TYLENOL) 325 MG tablet Take 2 tablets (650 mg total) by mouth every 6 (six) hours as needed for mild pain (or Fever >/= 101). Patient taking differently: Take 650 mg by mouth every 8 (eight) hours as needed for mild pain (or Fever >/= 101). 03/30/18  Yes Marcelyn Bruins, MD  albuterol (PROVENTIL HFA;VENTOLIN HFA) 108 (90 Base) MCG/ACT inhaler Inhale 2 puffs into the lungs 4 (four) times daily as needed for wheezing or shortness of breath.    Yes [provider]  albuterol (PROVENTIL) (2.5 MG/3ML) 0.083% nebulizer solution Take 2.5 mg by nebulization every 6 (six) hours as needed for wheezing or shortness of breath.   Yes [provider]  allopurinol (ZYLOPRIM) 100 MG tablet Take 100 mg by mouth at bedtime. For gout   Yes [provider]  AMINO ACIDS-PROTEIN HYDROLYS PO  Take 1 Dose by mouth 2 (two) times daily. Add SF Pro-Stat   Yes [provider]  amLODipine (NORVASC) 10 MG tablet Take 1 tablet (10 mg total) by mouth daily. 08/09/12  Yes Funches, Adriana Mccallum, MD  aspirin 81 MG chewable tablet Chew 81 mg by mouth daily.   Yes [provider]  calcium carbonate (TUMS EX) 750 MG chewable tablet Chew 1 tablet by mouth 2 (two) times daily as needed for heartburn.   Yes [provider]  Cholecalciferol (VITAMIN D-3) 1000 units CAPS Take 1,000 Units by mouth daily.   Yes [provider]  ciprofloxacin (CIPRO) 500 MG tablet Take 500 mg by mouth 2 (two) times daily. Every 14 (fourteen) days 10/03/21 10/17/21 Yes [provider]  escitalopram (LEXAPRO) 10 MG tablet Take 10 mg by mouth  daily.   Yes [provider]  Evolocumab with Infusor ([place]) 420 MG/3.5ML SOCT Inject 420 mg into the skin every 30 (thirty) days.   Yes [provider]  famotidine (PEPCID) 20 MG tablet Take 20 mg by mouth at bedtime.   Yes [provider]  ferrous sulfate 325 (65 FE) MG tablet Take 325 mg by mouth every other day.   Yes [provider]  insulin glargine (LANTUS) 100 UNIT/ML injection Inject 75 Units into the skin daily.   Yes [provider]  LORazepam (ATIVAN) 0.5 MG tablet Take 0.25 mg by mouth daily as needed for anxiety (x2 months). 07/30/21 10/22/21 Yes [provider]  losartan-hydrochlorothiazide (HYZAAR) 100-25 MG tablet Take 1 tablet by mouth daily.   Yes [provider]  Melatonin 10 MG TABS Take 10 mg by mouth at bedtime.   Yes [provider]  metFORMIN (GLUCOPHAGE) 850 MG tablet Take 850 mg by mouth 2 (two) times daily with a meal.   Yes [provider]  metoprolol (TOPROL-XL) 200 MG 24 hr tablet Take 200 mg by mouth daily. Take with or immediately following a meal.   Yes [provider]  Multiple Vitamins-Minerals (CERTAVITE SENIOR/ANTIOXIDANT) TABS Take 1 tablet by mouth daily.   Yes [provider]  nitroGLYCERIN (NITROSTAT) 0.4 MG SL tablet Place 0.4 mg under the tongue every 5 (five) minutes x 3 doses as needed for chest pain.  11/12/11  Yes Funches, Josalyn, MD  oxyCODONE (OXY IR/ROXICODONE) 5 MG immediate release tablet Take 1-2 tablets (5-10 mg total) by mouth every 4 (four) hours as needed for moderate pain (pain score 4-6). 09/13/21  Yes Timothy Lasso, MD  senna-docusate (SENOKOT-S) 8.6-50 MG tablet Take 2 tablets by mouth daily.   Yes [provider]  Skin Protectants, Misc. (MINERIN) CREA Apply 1 application topically 2 (two) times daily as needed (dryness).   Yes [provider]  traZODone (DESYREL) 50 MG tablet Take 50 mg by mouth at  bedtime.   Yes [provider]  vitamin B-12 (CYANOCOBALAMIN) 1000 MCG tablet Take 1,000 mcg by mouth daily.   Yes [provider]  ascorbic acid (VITAMIN C) 1000 MG tablet Take 1 tablet (1,000 mg total) by mouth daily. Patient not taking: Reported on 10/04/2021 09/14/21   Gaylan Gerold, DO  carbamazepine (TEGRETOL) 200 MG tablet Take 1 tablet (200 mg total) by mouth daily at 12 noon. Patient not taking: Reported on 10/04/2021 09/14/21   Gaylan Gerold, DO  DULoxetine (CYMBALTA) 60 MG capsule Take 60 mg by mouth daily. Patient not taking: Reported on 08/22/2021    [provider]  folic acid (FOLVITE) 1 MG  tablet Take 1 tablet (1 mg total) by mouth daily. 09/14/21   Gaylan Gerold, DO  hydrALAZINE (APRESOLINE) 25 MG tablet Take 1 tablet (25 mg total) by mouth every 8 (eight) hours. 09/13/21   Gaylan Gerold, DO  losartan (COZAAR) 100 MG tablet Take 100 mg by mouth daily. Patient not taking: Reported on 08/22/2021    [provider]  metoprolol succinate (TOPROL-XL) 100 MG 24 hr tablet Take 1 tablet (100 mg total) by mouth daily. Take with or immediately following a meal. Patient not taking: Reported on 10/04/2021 09/14/21 10/14/21  Gaylan Gerold, DO  mirtazapine (REMERON) 7.5 MG tablet Take 7.5 mg by mouth at bedtime. Patient not taking: Reported on 08/22/2021    [provider]  oxyCODONE 10 MG TABS Take 1-1.5 tablets (10-15 mg total) by mouth every 4 (four) hours as needed for severe pain (pain score 7-10). Patient not taking: Reported on 10/04/2021 09/13/21   Timothy Lasso, MD  sucralfate (CARAFATE) 1 g tablet Take 1 tablet (1 g total) by mouth 4 (four) times daily -  with meals and at bedtime. Patient not taking: Reported on 10/04/2021 09/13/21   Gaylan Gerold, DO    Current Facility-Administered Medications  Medication Dose Route Frequency Provider Last Rate Last Admin   0.9 %  sodium chloride infusion  250 mL Intravenous PRN Idamae Lusher, MD   Held  at 10/04/21 2004   0.9 %  sodium chloride infusion  10 mL/hr Intravenous Once Idamae Lusher, MD       acetaminophen (TYLENOL) tablet 650 mg  650 mg Oral Q6H PRN [name], MD       Or   acetaminophen (TYLENOL) suppository 650 mg  650 mg Rectal Q6H PRN [name], MD       albuterol (PROVENTIL) (2.5 MG/3ML) 0.083% nebulizer solution 2.5 mg  2.5 mg Nebulization Q6H PRN Axel Filler, MD       allopurinol (ZYLOPRIM) tablet 100 mg  100 mg Oral QHS [name], MD   100 mg at 10/04/21 2222   cefTRIAXone (ROCEPHIN) 1 g in sodium chloride 0.9 % 100 mL IVPB  1 g Intravenous Q24H [name], MD   Stopped at 10/05/21 0517   dextrose 5 % in lactated ringers infusion   Intravenous Continuous Rehman, Areeg N, DO 100 mL/hr at 10/05/21 0420 New Bag at 10/05/21 0420   DULoxetine (CYMBALTA) DR capsule 60 mg  60 mg Oral Daily [name], MD       enoxaparin (LOVENOX) injection 30 mg  30 mg Subcutaneous Q24H [name], MD   30 mg at 10/04/21 2035   insulin aspart (novoLOG) injection 0-9 Units  0-9 Units Subcutaneous TID WC [name], MD       LORazepam (ATIVAN) tablet 0.25 mg  0.25 mg Oral Daily PRN [name], MD       mirtazapine (REMERON) tablet 7.5 mg  7.5 mg Oral QHS [name], MD   7.5 mg at 10/04/21 2240   oxyCODONE (Oxy IR/ROXICODONE) immediate release tablet 5 mg  5 mg Oral Q6H PRN [name], MD   5 mg at 10/04/21 2034   pantoprazole (PROTONIX) EC tablet 40 mg  40 mg Oral Daily [name], MD   40 mg at 10/04/21 2031   sodium chloride flush (NS) 0.9 % injection 3 mL  3 mL Intravenous Q12H Idamae Lusher, MD   3 mL at 10/05/21 0924   sodium chloride flush (NS) 0.9 % injection 3 mL  3 mL Intravenous PRN  Idamae Lusher, MD       traZODone (DESYREL) tablet 50 mg  50 mg Oral QHS [name], MD   50 mg at 10/04/21 2030   Current Outpatient Medications  Medication Sig Dispense Refill   acetaminophen (TYLENOL) 325 MG tablet Take 2 tablets (650  mg total) by mouth every 6 (six) hours as needed for mild pain (or Fever >/= 101). (Patient taking differently: Take 650 mg by mouth every 8 (eight) hours as needed for mild pain (or Fever >/= 101).) 30 tablet 0   albuterol (PROVENTIL HFA;VENTOLIN HFA) 108 (90 Base) MCG/ACT inhaler Inhale 2 puffs into the lungs 4 (four) times daily as needed for wheezing or shortness of breath.      albuterol (PROVENTIL) (2.5 MG/3ML) 0.083% nebulizer solution Take 2.5 mg by nebulization every 6 (six) hours as needed for wheezing or shortness of breath.     allopurinol (ZYLOPRIM) 100 MG tablet Take 100 mg by mouth at bedtime. For gout     AMINO ACIDS-PROTEIN HYDROLYS PO Take 1 Dose by mouth 2 (two) times daily. Add SF Pro-Stat     amLODipine (NORVASC) 10 MG tablet Take 1 tablet (10 mg total) by mouth daily. 90 tablet 3   aspirin 81 MG chewable tablet Chew 81 mg by mouth daily.     calcium carbonate (TUMS EX) 750 MG chewable tablet Chew 1 tablet by mouth 2 (two) times daily as needed for heartburn.     Cholecalciferol (VITAMIN D-3) 1000 units CAPS Take 1,000 Units by mouth daily.     ciprofloxacin (CIPRO) 500 MG tablet Take 500 mg by mouth 2 (two) times daily. Every 14 (fourteen) days     escitalopram (LEXAPRO) 10 MG tablet Take 10 mg by mouth daily.     Evolocumab with Infusor ([place]) 420 MG/3.5ML SOCT Inject 420 mg into the skin every 30 (thirty) days.     famotidine (PEPCID) 20 MG tablet Take 20 mg by mouth at bedtime.     ferrous sulfate 325 (65 FE) MG tablet Take 325 mg by mouth every other day.     insulin glargine (LANTUS) 100 UNIT/ML injection Inject 75 Units into the skin daily.     LORazepam (ATIVAN) 0.5 MG tablet Take 0.25 mg by mouth daily as needed for anxiety (x2 months).     losartan-hydrochlorothiazide (HYZAAR) 100-25 MG tablet Take 1 tablet by mouth daily.     Melatonin 10 MG TABS Take 10 mg by mouth at bedtime.     metFORMIN (GLUCOPHAGE) 850 MG tablet Take 850 mg by mouth 2 (two)  times daily with a meal.     metoprolol (TOPROL-XL) 200 MG 24 hr tablet Take 200 mg by mouth daily. Take with or immediately following a meal.     Multiple Vitamins-Minerals (CERTAVITE SENIOR/ANTIOXIDANT) TABS Take 1 tablet by mouth daily.     nitroGLYCERIN (NITROSTAT) 0.4 MG SL tablet Place 0.4 mg under the tongue every 5 (five) minutes x 3 doses as needed for chest pain.      oxyCODONE (OXY IR/ROXICODONE) 5 MG immediate release tablet Take 1-2 tablets (5-10 mg total) by mouth every 4 (four) hours as needed for moderate pain (pain score 4-6). 30 tablet 0   senna-docusate (SENOKOT-S) 8.6-50 MG tablet Take 2 tablets by mouth daily.     Skin Protectants, Misc. (MINERIN) CREA Apply 1 application topically 2 (two) times daily as needed (dryness).     traZODone (DESYREL) 50 MG tablet Take 50 mg by mouth at bedtime.  vitamin B-12 (CYANOCOBALAMIN) 1000 MCG tablet Take 1,000 mcg by mouth daily.     ascorbic acid (VITAMIN C) 1000 MG tablet Take 1 tablet (1,000 mg total) by mouth daily. (Patient not taking: Reported on 10/04/2021)     carbamazepine (TEGRETOL) 200 MG tablet Take 1 tablet (200 mg total) by mouth daily at 12 noon. (Patient not taking: Reported on 10/04/2021)     DULoxetine (CYMBALTA) 60 MG capsule Take 60 mg by mouth daily. (Patient not taking: Reported on 99/37/1696)     folic acid (FOLVITE) 1 MG tablet Take 1 tablet (1 mg total) by mouth daily.     hydrALAZINE (APRESOLINE) 25 MG tablet Take 1 tablet (25 mg total) by mouth every 8 (eight) hours.     losartan (COZAAR) 100 MG tablet Take 100 mg by mouth daily. (Patient not taking: Reported on 08/22/2021)     metoprolol succinate (TOPROL-XL) 100 MG 24 hr tablet Take 1 tablet (100 mg total) by mouth daily. Take with or immediately following a meal. (Patient not taking: Reported on 10/04/2021) 30 tablet 0   mirtazapine (REMERON) 7.5 MG tablet Take 7.5 mg by mouth at bedtime. (Patient not taking: Reported on 08/22/2021)     oxyCODONE 10 MG TABS Take  1-1.5 tablets (10-15 mg total) by mouth every 4 (four) hours as needed for severe pain (pain score 7-10). (Patient not taking: Reported on 10/04/2021) 30 tablet 0   sucralfate (CARAFATE) 1 g tablet Take 1 tablet (1 g total) by mouth 4 (four) times daily -  with meals and at bedtime. (Patient not taking: Reported on 10/04/2021)      Allergies as of 10/04/2021 - Review Complete 10/04/2021  Allergen Reaction Noted   Atorvastatin Other (See Comments) 03/26/2018   Statins Other (See Comments) 03/03/2018   Flexeril [cyclobenzaprine] Other (See Comments) 07/23/2012    Family History  Problem Relation Age of Onset   Diabetes Sister    Hypertension Mother     Social History   Socioeconomic History   Marital status: Widowed    Spouse name: Not on file   Number of children: Not on file   Years of education: Not on file   Highest education level: Not on file  Occupational History   Occupation: disabled  Tobacco Use   Smoking status: Former    Packs/day: 2.00    Years: 40.00    Pack years: 80.00    Types: Cigarettes    Quit date: 01/13/2002    Years since quitting: 19.7   Smokeless tobacco: Current    Types: Chew   Tobacco comments:    chews a little tobacco  Vaping Use   Vaping Use: Never used  Substance and Sexual Activity   Alcohol use: Not Currently    Comment: 03/03/18: "Used to drink alot. 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   Lives in assisted living. Performs ADLs and IADLs, including cooking and managing his medications. Friend drives him to the store. Uses rollator walker at home and motorized scooter in store.   [facility] patient   Widowed   5-8 years education   Likes to fish   Pets:  4 cockatiels and 2 lovebirds    Social Determinants  of Health   Financial Resource Strain: Not on file  Food Insecurity: Not on file  Transportation Needs: Not on file  Physical Activity: Not on file  Stress: Not on file  Social Connections: Not on file  Intimate Partner Violence: Not on file    Review of Systems: ROS is O/W negative except as mentioned in HPI.  Physical Exam: Vital signs in last 24 hours: Temp:  [97.1 F (36.2 C)-98.7 F (37.1 C)] 98.7 F (37.1 C) (12/09 2100) Pulse Rate:  [62-71] 71 (12/10 0730) Resp:  [10-19] 16 (12/10 0730) BP: (96-129)/(40-102) 129/75 (12/10 0730) SpO2:  [97 %-100 %] 100 % (12/10 0730) Weight:  [81.8 kg] 81.8 kg (12/09 1546)   General:  Alert, chronically ill-appearing, pleasant and cooperative in NAD Head:  Normocephalic and atraumatic. Eyes:  Sclera clear, no icterus.  Conjunctiva pink. Ears:  Normal auditory acuity. Mouth:  No deformity or lesions.   Lungs:  Clear throughout to auscultation.  No wheezes, crackles, or rhonchi.  Heart:  Regular rate and rhythm; no murmurs, clicks, rubs, or gallops. Abdomen:  Soft, non-distended.  BS present.  Non-tender. Rectal:  Deferred.  Heme negative here.  Msk:  Symmetrical without gross deformities. Pulses:  Normal pulses noted. Extremities:  Right BKA.  Left AKA. Neurologic:  Alert and oriented x 4;  grossly normal neurologically. Skin:  Intact without significant lesions or rashes.  Intake/Output from previous day: 12/09 0701 - 12/10 0700 In: 1415 [Blood:315; IV Piggyback:1100] Out: 2000 [Urine:2000]  Lab Results: Recent Labs    10/04/21 1538 10/04/21 1604 10/05/21 0534  WBC 10.7*  --  7.0  HGB 7.0* 7.5*  7.5* 7.6*  HCT 23.1* 22.0*  22.0* 24.3*  PLT 229  --  202   BMET Recent Labs    10/04/21 1538 10/04/21 1604 10/05/21 0534  NA 130* 135  133* 134*  K 4.5 4.6  4.5 4.2  CL 102 103 105  CO2 20*  --  19*  GLUCOSE 68* 65* 150*  BUN 86* 106* 70*  CREATININE 2.01* 2.00* 1.62*  CALCIUM 8.4*  --   8.1*   LFT Recent Labs    10/04/21 1538  PROT 6.5  ALBUMIN 2.2*  AST 30  ALT 18  ALKPHOS 93  BILITOT 0.3   PT/INR Recent Labs    10/04/21 1538  LABPROT 16.0*  INR 1.3*   Studies/Results: US RENAL  Result Date: 10/05/2021 CLINICAL DATA:  Acute kidney injury. EXAM: RENAL / URINARY TRACT ULTRASOUND COMPLETE COMPARISON:  August 19, 2021. FINDINGS: Right Kidney: Renal measurements: 9.3 x 5.1 x 5.2 cm = volume: 130 mL. Mild cortical thinning is noted. Echogenicity within normal limits. No mass or hydronephrosis visualized. Left Kidney: Renal measurements: 11.5 x 6.5 x 6.2 cm = volume: 239 mL. Echogenicity within normal limits. No mass or hydronephrosis visualized. Bladder: Decompressed secondary to Foley catheter. Other: None. IMPRESSION: Mild right renal cortical thinning is noted. No hydronephrosis or renal obstruction is noted. Electronically Signed   By: Marijo Conception M.D.   On: 10/05/2021 09:05   DG Chest Portable 1 View  Result Date: 10/04/2021 CLINICAL DATA:  Low hemoglobin.  UTI. EXAM: PORTABLE CHEST 1 VIEW COMPARISON:  08/18/2021 FINDINGS: 1549 hours. The lungs are clear without focal pneumonia, edema, pneumothorax or pleural effusion. Cardiopericardial silhouette is at upper limits of normal for size. The visualized bony structures of the thorax show no acute abnormality. Telemetry leads overlie the chest. IMPRESSION: No active disease. Electronically Signed   By: Randall Hiss  Tery Sanfilippo M.D.   On: 10/04/2021 16:03    IMPRESSION:  *Symptomatic anemia, this has been a chronic issue dating back to 2014:  Hgb 7.0 grams which is actually not far from 3 weeks ago.  He is FOBT negative.  Received 1 unit of packed red blood cells and hemoglobin up to 7.6 g.  Iron studies are okay. *GI bleed in November with severe esophagitis, mild gastritis with luminal deformity of the distal stomach, active bleeding in the duodenal bulb--appeance and clinical presentation seems most consistent with Dieulafoy  lesion. This was successfully treated with a combination of bipolar cautery, hemostatic clips x3, then Epinephrine injection.  Also had a non-bleeding duodenal ulcer with no stigmata of bleeding. *IDDM *Positive family history colon cancer causing his brother's death in his early 72s.  Patient has never undergone colon colonoscopy *UTI:  Treating with abx. *Recent septic arthritis of the left knee, status post AKA in November   PLAN: -It would is advised that he remain on pantoprazole 40 mg twice daily for 6 to 8 weeks and then go to once daily.  I am not sure that he was on that as an outpatient at all and here he was only placed on once daily.  I will change that to twice a day. -No endoscopic evaluation needed at this time. -Monitor hemoglobin and transfuse if needed.  Laban Emperor. Zehr  10/05/2021, 9:31 AM  I have taken an interval history, reviewed the chart and examined the patient. I agree with the Advanced Practitioner's note, impression and recommendations.  Majority the medical decision-making in the formulation of the assessment and plan were performed by me.  My additional thoughts are as follows:  Thorough chart review performed, most notably events of recent hospitalization with GI bleeding and findings of endoscopic procedures. Admitted for generalized weakness, found to have UTI and hemoglobin decreased from value at recent discharge.  He has no overt GI bleeding, and in fact is heme-negative.  He denies abdominal pain, says he has been eating well.  As near as we know, he has been taking acid suppression medicine as ordered at his extended care facility.  Iron studies are normal, he received 1 unit PRBC transfusion. This patient does not appear to have ongoing GI bleeding, no current plans for endoscopic procedures.  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 He needs outpatient checks of his  hemoglobin, transfuse as needed, and hospitalization if having overt GI bleeding with melena or bright red blood per rectum  Thank you for asking her advice on this patient's case.  No further testing or changes in treatment from our perspective, thus we will sign off and you may call us as needed.   Nelida Meuse III Office:[phone]

## 2021-10-06 DIAGNOSIS — L8990 Pressure ulcer of unspecified site, unspecified stage: Secondary | ICD-10-CM | POA: Insufficient documentation

## 2021-10-06 LAB — CBC
HCT: 27.3 % — ABNORMAL LOW (ref 39.0–52.0)
Hemoglobin: 8.6 g/dL — ABNORMAL LOW (ref 13.0–17.0)
MCH: 28 pg (ref 26.0–34.0)
MCHC: 31.5 g/dL (ref 30.0–36.0)
MCV: 88.9 fL (ref 80.0–100.0)
Platelets: 225 10*3/uL (ref 150–400)
RBC: 3.07 MIL/uL — ABNORMAL LOW (ref 4.22–5.81)
RDW: 17.9 % — ABNORMAL HIGH (ref 11.5–15.5)
WBC: 7.2 10*3/uL (ref 4.0–10.5)
nRBC: 0 % (ref 0.0–0.2)

## 2021-10-06 LAB — BASIC METABOLIC PANEL
Anion gap: 8 (ref 5–15)
BUN: 36 mg/dL — ABNORMAL HIGH (ref 8–23)
CO2: 24 mmol/L (ref 22–32)
Calcium: 8.8 mg/dL — ABNORMAL LOW (ref 8.9–10.3)
Chloride: 104 mmol/L (ref 98–111)
Creatinine, Ser: 1.28 mg/dL — ABNORMAL HIGH (ref 0.61–1.24)
GFR, Estimated: 58 mL/min — ABNORMAL LOW (ref >60)
Glucose, Bld: 85 mg/dL (ref 70–99)
Potassium: 4.5 mmol/L (ref 3.5–5.1)
Sodium: 136 mmol/L (ref 135–145)

## 2021-10-06 LAB — GLUCOSE, CAPILLARY
Glucose-Capillary: 81 mg/dL (ref 70–99)
Glucose-Capillary: 79 mg/dL (ref 70–99)
Glucose-Capillary: 151 mg/dL — ABNORMAL HIGH (ref 70–99)
Glucose-Capillary: 150 mg/dL — ABNORMAL HIGH (ref 70–99)
Glucose-Capillary: 141 mg/dL — ABNORMAL HIGH (ref 70–99)

## 2021-10-06 MED ORDER — SENNA 8.6 MG PO TABS
2.0000 | ORAL_TABLET | Freq: Every evening | ORAL | Status: DC | PRN
  Administered 2021-10-06: 17.2 mg via ORAL
  Filled 2021-10-06: qty 2

## 2021-10-06 MED ORDER — POLYETHYLENE GLYCOL 3350 17 G PO PACK: 17.0000 g | PACK | Freq: Every day | ORAL | Status: DC | PRN

## 2021-10-06 MED ORDER — AMLODIPINE BESYLATE 10 MG PO TABS
10.0000 mg | ORAL_TABLET | Freq: Every day | ORAL | Status: DC
  Administered 2021-10-06 – 2021-10-07 (×2): 10 mg via ORAL
  Filled 2021-10-06 (×2): qty 1

## 2021-10-06 MED ORDER — INSULIN ASPART 100 UNIT/ML IJ SOLN
0.0000 [IU] | Freq: Three times a day (TID) | INTRAMUSCULAR | Status: DC
  Administered 2021-10-06: 1 [IU] via SUBCUTANEOUS
  Administered 2021-10-07 (×2): 2 [IU] via SUBCUTANEOUS

## 2021-10-06 NOTE — Progress Notes (Signed)
Pt unable to void since foley cath removal earlier today. Pt reports feeling the urge to urinate along with a burning sensation but unable to void at this time. Bladder scan perform and showed 431 ml. Pt has order for In and Out cath prn. In and out cath performed output 600 ml.

## 2021-10-06 NOTE — Progress Notes (Signed)
Subjective:  Acute overnight events.  Patient states he is doing well this morning.  Continues to feel at baseline.  Denies any new complaints of chest pain, dysuria, shortness of breath.  Does feel hungry this morning but states he often loses appetite once he starts eating.  Denies any dysphagia, nausea, vomiting, any other troubles when eating aside from losing his appetite.  He expresses frustration that new medical issues keep occurring soon after he gets over no prior issue.  Objective:  Vital signs in last 24 hours: Vitals:   10/05/21 1819 10/05/21 2100 10/05/21 2349 10/06/21 0516  BP: (!) 159/59 (!) 160/65 (!) 154/61 (!) 175/93  Pulse: 77 80 81 84  Resp: 19 18 17 18   Temp: 98 F (36.7 C) 98.3 F (36.8 C) 97.7 F (36.5 C) 97.6 F (36.4 C)  TempSrc: Oral Oral Oral Oral  SpO2: 100% 100% 100% 100%  Weight:       Constitutional: Obese chronically ill-appearing.  In no acute distress HENT: Normocephalic and atraumatic, mucous membranes moist Cardiovascular: Normal rate, regular rhythm, S1 and S2 present, no murmurs, rubs, gallops.  Radial pulses intact Respiratory: No respiratory distress, no accessory muscle use.  Effort is normal.  Lungs are clear to auscultation bilaterally. GI: Nondistended, soft, nontender to palpation, normal active bowel sounds Musculoskeletal: Right BKA.  Left AKA. Distal extremities  warm  Neurological: Is alert and oriented x4, no apparent focal deficits noted. Skin: Warm and dry.   Psychiatric: Normal mood and affect. Behavior is normal. Judgment and thought content normal.   Assessment/Plan:  Principal Problem:   Symptomatic anemia Active Problems:   Diabetes mellitus (HCC)   AKI (acute kidney injury) ([place])   Severe protein-calorie malnutrition ([place])   Hypoglycemia   Urine retention   Leslie [name] is a 75 y.o. with pertinent PMH of T2DM c/b neuropathy, HLD, HTN, COPD, sacral pressure ulcer,  bilateral BKAs,  recent upper GI bleed 2/2  duodenal ulcer, and an indwelling foley who presented with burning with urination and admitted for UTI and symptomatic anemia complicated by acute on chronic kidney disease   #Hypoglycemia-resolved #DMII Patient has been eating and drinking well during this hospitalization.  His D5 infusion was discontinued yesterday and CBGs have been stable.  We will continue to monitor his blood sugar and restart his insulin as needed. Would recommend starting around 20 units of long-acting insulin if CBG start rising.  -CBG 4 times daily   #Acute on chronic kidney disease Likely in setting of poor p.o. intake in addition underlying chronic kidney.disease. Creatinine 1.2 at baseline.  Kidney function much improved with BUN of 36 and creatinine 1.28 this morning.  Renal ultrasound with cortical thinning on the right without hydronephrosis or obstruction. - avoid nephrotoxic medications - daily bmp  #Normocytic Anemia  Patient had a recent upper GI bleed due a duodenal ulcer at a prior admission. Ferritin of 149 with iron of 45 and TIBC of 200.  Likely has component of anemia of chronic renal disease and chronic inflammation contributing to his anemia.  Globin stable at 8.6 this morning.  No further signs of GI bleeding during this admission. - Continue Protonix twice daily for 6 to 8 weeks then daily - daily CBC - transfuse for hemoglobin less than 7  #Suspected UTI in the setting of indwelling foley #Urinary retention Patient coming in with dysuria in the setting of an indwelling foley placed on 12/6 due to concerns for urinary retention. Urine culture with 1K colonies  of e. Coli.  Patient has completed course of antibiotics.  No history of retention. -voiding trial today  #GERD Continue Protonix BID  #PAD s/p right BKA and left AKA #Deconditioning, functional decline Patient with history of PAD and diabetic ulcers requiring bilateral lower extremity amputations.  Most recently was admitted in October  to November for septic arthritis of the left knee ultimately requiring left AKA.  His wound has been healing well.  [facility] noted that he has had worsening cognition and deconditioning since his most recent hospitalization last month.  Patient lives alone and has little social support and difficult home situation. Will consult palliative to discuss goals of care giving downward trajectory of his health -PT OT -continue aspirin -palliative care consult   #COPD -continue albuterol inhaler   #Sacral Ulcer --Continue wound care - Off load with turning --Monitor for worsening condition   #HTN #HLD Holding home BP meds at this time 2/2 hypotension and AKI.  Home medications are amlodipine  10 mg daily, Hyzaar 100-25 mg daily and metoprolol 200 mg daily.  Has history of statin intolerance on Repatha last dose was 12/5. - Restart amlodipine 10 mg daily   #Gout Continue allopurinol   #Depression - continue trazadone and Lexapro 10 mg daily   #Hyponatremia Chronic, stable --Daily BMP   Prior to Admission Living Arrangement: SNF Anticipated Discharge Location:SNF Barriers to Discharge:Medical stability Dispo: Anticipated discharge in approximately 1-2 day(s).   [name], MD 10/06/2021, 7:08 AM Pager: [phone] After 5pm on weekdays and 1pm on weekends: On Call pager [phone]

## 2021-10-06 NOTE — Evaluation (Signed)
Physical Therapy Evaluation Patient Details Name: Leslie [name] MRN: [id number] DOB: 06/11/1946 Today's Date: 10/06/2021  History of Present Illness  75 year old male with past history of hypertension, hyperlipidemia, type 2 diabetes, COPD, PVD s/p right post left AKA, staples removed, and old R BKA, alcohol use disorder here for weakness.  Has c/o burning with urination, urinary retention for the past couple of days.  He has a Foley, UA was concerning for UTI.  Recently at SNF undergoing ST rehab.  Clinical Impression   Pt admitted with above diagnosis. Comes from SNF where he was undergoing physical rehabilitation; Overall pt describes being able to function well and independently at a wheelchair transfer level; the problem is, his wheelchair can't fit through the doorway to his bathroom, and  that to safely get back to his apartment, he will need to either be able to safely manage in his bathroom on his prosthesis, or be able to manage ADLs without needing to get into the bathroom (or move to a fully wheelchair accessible apartment); Presents to PT with decr functional mobility, and tells me he is unsteady with just using his R prosthesis; Agreee with continuing therapies at SNF to continue to address above;  Pt currently with functional limitations due to the deficits listed below (see PT Problem List). Pt will benefit from skilled PT to increase their independence and safety with mobility to allow discharge to the venue listed below.          Recommendations for follow up therapy are one component of a multi-disciplinary discharge planning process, led by the attending physician.  Recommendations may be updated based on patient status, additional functional criteria and insurance authorization.  Follow Up Recommendations Skilled nursing-short term rehab (<3 hours/day)    Assistance Recommended at Discharge Frequent or constant Supervision/Assistance  Functional Status Assessment Patient has  had a recent decline in their functional status and demonstrates the ability to make significant improvements in function in a reasonable and predictable amount of time.  Equipment Recommendations  None recommended by PT    Recommendations for Other Services       Precautions / Restrictions Precautions Precautions: Fall Precaution Comments: chronic R BKA (RLE prosthetic at SNF) now s/p L AKA. Restrictions RLE Weight Bearing: Non weight bearing LLE Weight Bearing: Non weight bearing      Mobility  Bed Mobility Overal bed mobility: Needs Assistance Bed Mobility: Supine to Sit     Supine to sit: Min assist     General bed mobility comments: Min handheld assist to pull to sit; pt able to slide self up in bed using bedrails    Transfers Overall transfer level: Needs assistance Equipment used: None Transfers: Bed to chair/wheelchair/BSC            Lateral/Scoot Transfers: Mod assist;+2 safety/equipment General transfer comment: ModA for scooting to drop arm recliner from EOB towards R-side, reliant on BUE support; pt requiring max verbal cues and assist for transfer set-up and safety    Ambulation/Gait                  Stairs            Wheelchair Mobility    Modified Rankin (Stroke Patients Only)       Balance     Sitting balance-Leahy Scale: Good  Pertinent Vitals/Pain Pain Assessment: No/denies pain Pain Intervention(s): Monitored during session    Home Living Family/patient expects to be discharged to:: Skilled nursing facility Living Arrangements: Alone Available Help at Discharge: Friend(s);Available PRN/intermittently Type of Home: Apartment Home Access: Ramped entrance       Home Layout: One level Home Equipment: Conservation officer, nature (2 wheels);Wheelchair - Clinical research associate Comments: Reports his wheelchair does not fit in bathroom    Prior Function  Prior Level of Function : History of Falls (last six months);Independent/Modified Independent             Mobility Comments: mostly w/c level. assist with anterior/posterion transfers to w/c ADLs Comments: Pt tells me his bathroom is not wheelchair accessible and that there are no grab bars in his bathroom; tells me he tands to fall in the bathroom for this reason     Hand Dominance   Dominant Hand: Right    Extremity/Trunk Assessment   Upper Extremity Assessment Upper Extremity Assessment: Defer to OT evaluation    Lower Extremity Assessment Lower Extremity Assessment: RLE deficits/detail;LLE deficits/detail RLE Deficits / Details: chronic R BKA with good ability to move against gravity, achieve full knee extension and with good movement at hip. slight limitation in HS length LLE Deficits / Details: L AKA       Communication   Communication: No difficulties  Cognition Arousal/Alertness: Awake/alert Behavior During Therapy: WFL for tasks assessed/performed;Restless Overall Cognitive Status: Impaired/Different from baseline Area of Impairment: Safety/judgement                         Safety/Judgement: Decreased awareness of safety     General Comments: Pt repeatedly states he wants to get home, and that he can do more at home (this is not entirely unreasonable -- he can function at a wheelchair transfer level); the catch is: he tells me he can't get his wheelchair in the bathroom, and that tends to be where he falls -- but he didn't put together that at this time, to go home he will need to figure out how to stay safely at wheelchair level; when this PT mentioned managing bathroom ADLs without going in to his bathroom (and therefore staying at safe, wheelchair-transer functional level), he did not want to address otehr options        General Comments General comments (skin integrity, edema, etc.): Prosthesis is at his SNF    Exercises     Assessment/Plan     PT Assessment Patient needs continued PT services  PT Problem List Decreased strength;Decreased range of motion;Decreased activity tolerance;Decreased balance;Decreased mobility;Decreased coordination;Decreased safety awareness;Pain       PT Treatment Interventions DME instruction;Functional mobility training;Therapeutic activities;Therapeutic exercise;Balance training;Neuromuscular re-education;Cognitive remediation;Patient/family education;Wheelchair mobility training;Gait training (will consider gait training if able to bring porsthesis to Acute)    PT Goals (Current goals can be found in the Care Plan section)  Acute Rehab PT Goals Patient Stated Goal: Wants to get home soon PT Goal Formulation: With patient Time For Goal Achievement: 10/20/21 Potential to Achieve Goals: Fair    Frequency Min 2X/week   Barriers to discharge Decreased caregiver support No barriers to getting back to SNF for rehab; from a longer-term perspective, needs a more handicap accessible apartment    Co-evaluation               AM-PAC PT "6 Clicks" Mobility  Outcome Measure Help needed turning from your back to your side while in a flat  bed without using bedrails?: None Help needed moving from lying on your back to sitting on the side of a flat bed without using bedrails?: A Little Help needed moving to and from a bed to a chair (including a wheelchair)?: A Lot Help needed standing up from a chair using your arms (e.g., wheelchair or bedside chair)?: Total Help needed to walk in hospital room?: Total Help needed climbing 3-5 steps with a railing? : Total 6 Click Score: 12    End of Session Equipment Utilized During Treatment: Other (comment) (bed pads) Activity Tolerance: Patient tolerated treatment well Patient left: in chair;with call bell/phone within reach;with chair alarm set Nurse Communication: Mobility status PT Visit Diagnosis: Muscle weakness (generalized) (M62.81);Other abnormalities  of gait and mobility (R26.89);Repeated falls (R29.6)    Time: 1694-5038 PT Time Calculation (min) (ACUTE ONLY): 24 min   Charges:   PT Evaluation $PT Eval Moderate Complexity: 1 Mod PT Treatments $Therapeutic Activity: 8-22 mins        Roney Marion, PT  Acute Rehabilitation Services Pager [phone] Office [phone]   Colletta [place] 10/06/2021, 4:20 PM

## 2021-10-06 NOTE — Progress Notes (Signed)
Foley discontinued at 12pm,pt due to void by 6pm

## 2021-10-07 DIAGNOSIS — R627 Adult failure to thrive: Secondary | ICD-10-CM

## 2021-10-07 DIAGNOSIS — R531 Weakness: Secondary | ICD-10-CM

## 2021-10-07 DIAGNOSIS — Z515 Encounter for palliative care: Secondary | ICD-10-CM

## 2021-10-07 DIAGNOSIS — Z66 Do not resuscitate: Secondary | ICD-10-CM

## 2021-10-07 LAB — URINE CULTURE: Culture: 1000 — AB

## 2021-10-07 LAB — BASIC METABOLIC PANEL
Anion gap: 8 (ref 5–15)
BUN: 33 mg/dL — ABNORMAL HIGH (ref 8–23)
CO2: 27 mmol/L (ref 22–32)
Calcium: 8.8 mg/dL — ABNORMAL LOW (ref 8.9–10.3)
Chloride: 101 mmol/L (ref 98–111)
Creatinine, Ser: 1.5 mg/dL — ABNORMAL HIGH (ref 0.61–1.24)
GFR, Estimated: 48 mL/min — ABNORMAL LOW (ref >60)
Glucose, Bld: 91 mg/dL (ref 70–99)
Potassium: 5 mmol/L (ref 3.5–5.1)
Sodium: 136 mmol/L (ref 135–145)

## 2021-10-07 LAB — CBC
HCT: 27.9 % — ABNORMAL LOW (ref 39.0–52.0)
Hemoglobin: 8.9 g/dL — ABNORMAL LOW (ref 13.0–17.0)
MCH: 28.8 pg (ref 26.0–34.0)
MCHC: 31.9 g/dL (ref 30.0–36.0)
MCV: 90.3 fL (ref 80.0–100.0)
Platelets: 228 10*3/uL (ref 150–400)
RBC: 3.09 MIL/uL — ABNORMAL LOW (ref 4.22–5.81)
RDW: 17.5 % — ABNORMAL HIGH (ref 11.5–15.5)
WBC: 7.3 10*3/uL (ref 4.0–10.5)
nRBC: 0 % (ref 0.0–0.2)

## 2021-10-07 LAB — GLUCOSE, CAPILLARY
Glucose-Capillary: 157 mg/dL — ABNORMAL HIGH (ref 70–99)
Glucose-Capillary: 161 mg/dL — ABNORMAL HIGH (ref 70–99)

## 2021-10-07 MED ORDER — PANTOPRAZOLE SODIUM 40 MG PO TBEC: 40.0000 mg | DELAYED_RELEASE_TABLET | Freq: Two times a day (BID) | ORAL | 1 refills | Status: AC

## 2021-10-07 MED ORDER — LIDOCAINE HCL URETHRAL/MUCOSAL 2 % EX GEL
1.0000 "application " | Freq: Once | CUTANEOUS | Status: AC
  Administered 2021-10-07: 1 via URETHRAL
  Filled 2021-10-07: qty 6

## 2021-10-07 MED ORDER — INSULIN ASPART 100 UNIT/ML IJ SOLN: 0.0000 [IU] | Freq: Three times a day (TID) | INTRAMUSCULAR | 11 refills | Status: AC

## 2021-10-07 NOTE — TOC Initial Note (Signed)
Transition of Care [facility]) - Initial/Assessment Note    Patient Details  Name: Leslie [name] MRN: [id number] Date of Birth: 1945/12/16  [facility] CM/SW Contact:    Emeterio Reeve, LCSW Phone Number: 10/07/2021, 2:39 PM  Clinical Narrative:                  CSW received SNF consult. CSW met with pt at bedside. CSW introduced self and explained role at the hospital. Pt reports that PTA he was at [facility] for SNF. Prior to that pt was at home alone.[facility] is able to accept pt today at discharge.   CSW spoke to [place] at [place] who stated pt is able to return today.    CSW will continue to follow.   Expected Discharge Plan: Skilled Nursing Facility Barriers to Discharge: No Barriers Identified   Patient Goals and CMS Choice Patient states their goals for this hospitalization and ongoing recovery are:: return to SNF CMS Medicare.gov Compare Post Acute Care list provided to:: Patient Choice offered to / list presented to : Patient  Expected Discharge Plan and Services Expected Discharge Plan: [place]       Living arrangements for the past 2 months: [place] Expected Discharge Date: 10/07/21                                    Prior Living Arrangements/Services Living arrangements for the past 2 months: [place] Lives with:: Facility Resident Patient language and need for interpreter reviewed:: No Do you feel safe going back to the place where you live?: Yes      Need for Family Participation in Patient Care: Yes (Comment) Care giver support system in place?: Yes (comment)   Criminal Activity/Legal Involvement Pertinent to Current Situation/Hospitalization: No - Comment as needed  Activities of Daily Living      Permission Sought/Granted Permission sought to share information with : Facility Art therapist granted to share information with : Yes, Verbal Permission Granted      Permission granted to share info w AGENCY: SNF        Emotional Assessment Appearance:: Appears stated age Attitude/Demeanor/Rapport: Engaged Affect (typically observed): Appropriate Orientation: : Oriented to Self, Oriented to Place, Oriented to  Time, Oriented to Situation Alcohol / Substance Use: Not Applicable Psych Involvement: No (comment)  Admission diagnosis:  Lower urinary tract infectious disease [N39.0] Generalized weakness [R53.1] AKI (acute kidney injury) ([place]) [N17.9] Symptomatic anemia [D64.9] Patient Active Problem List   Diagnosis Date Noted   Pressure injury of skin 10/06/2021   Hypoglycemia 10/05/2021   Urine retention 10/05/2021   Gastritis and gastroduodenitis    AKI (acute kidney injury) ([place])    Symptomatic anemia    Severe protein-calorie malnutrition ([place])    Solitary pulmonary nodule 08/14/2015   Peripheral arterial disease ([place]) 04/18/2015   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 [place] [address] Phone: [phone] Fax: [phone]  Social Determinants of Health (SDOH) Interventions    Readmission Risk Interventions No flowsheet data found.  Emeterio Reeve, LCSW Clinical Social Worker

## 2021-10-07 NOTE — Discharge Summary (Signed)
Name: Leslie [name] MRN: [id number] DOB: August 26, 1946 75 y.o. PCP: [facility] [place]  Date of Admission: 10/04/2021  3:24 PM Date of Discharge:   10/07/21 Attending Physician: Axel Filler, *  Discharge Diagnosis: 1. 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 9.Deconditioning, functional decline 10.COPD 51.Sacral Ulcer 12.HTN 13.HLD 14.Gout 15.Depression  Discharge Medications: Allergies as of 10/07/2021       Reactions   Atorvastatin Other (See Comments)   Unknown reaction   Statins Other (See Comments)   Unknown reaction - on MAR   Flexeril [cyclobenzaprine] Other (See Comments)   Dry mouth and hallucinations        Medication List     STOP taking these medications    carbamazepine 200 MG tablet Commonly known as: TEGRETOL   ciprofloxacin 500 MG tablet Commonly known as: CIPRO   DULoxetine 60 MG capsule Commonly known as: CYMBALTA   famotidine 20 MG tablet Commonly known as: PEPCID   hydrALAZINE 25 MG tablet Commonly known as: APRESOLINE   insulin glargine 100 UNIT/ML injection Commonly known as: LANTUS   losartan 100 MG tablet Commonly known as: COZAAR   losartan-hydrochlorothiazide 100-25 MG tablet Commonly known as: HYZAAR   metoprolol 200 MG 24 hr tablet Commonly known as: TOPROL-XL   metoprolol succinate 100 MG 24 hr tablet Commonly known as: TOPROL-XL   mirtazapine 7.5 MG tablet Commonly known as: REMERON       TAKE these medications    acetaminophen 325 MG tablet Commonly known as: TYLENOL Take 2 tablets (650 mg total) by mouth every 6 (six) hours as needed for mild pain (or Fever >/= 101). What changed: when to take this   albuterol 108 (90 Base) MCG/ACT inhaler Commonly known as: VENTOLIN HFA Inhale 2 puffs into the  lungs 4 (four) times daily as needed for wheezing or shortness of breath.   albuterol (2.5 MG/3ML) 0.083% nebulizer solution Commonly known as: PROVENTIL Take 2.5 mg by nebulization every 6 (six) hours as needed for wheezing or shortness of breath.   allopurinol 100 MG tablet Commonly known as: ZYLOPRIM Take 100 mg by mouth at bedtime. For gout   AMINO ACIDS-PROTEIN HYDROLYS PO Take 1 Dose by mouth 2 (two) times daily. Add SF Pro-Stat   amLODipine 10 MG tablet Commonly known as: NORVASC Take 1 tablet (10 mg total) by mouth daily.   ascorbic acid 1000 MG tablet Commonly known as: VITAMIN C Take 1 tablet (1,000 mg total) by mouth daily.   aspirin 81 MG chewable tablet Chew 81 mg by mouth daily.   calcium carbonate 750 MG chewable tablet Commonly known as: TUMS EX Chew 1 tablet by mouth 2 (two) times daily as needed for heartburn.   CertaVite Senior/Antioxidant Tabs Take 1 tablet by mouth daily.   escitalopram 10 MG tablet Commonly known as: LEXAPRO Take 10 mg by mouth daily.   ferrous sulfate 325 (65 FE) MG tablet Take 325 mg by mouth every other day.   folic acid 1 MG tablet Commonly known as: FOLVITE Take 1 tablet (1 mg total) by mouth daily.   insulin aspart 100 UNIT/ML injection Commonly known as: novoLOG Inject 0-9 Units into the skin 3 (three) times daily with meals.   LORazepam 0.5 MG tablet Commonly known as: ATIVAN Take 0.25 mg by mouth daily as needed for anxiety (x2 months).  Melatonin 10 MG Tabs Take 10 mg by mouth at bedtime.   metFORMIN 850 MG tablet Commonly known as: GLUCOPHAGE Take 850 mg by mouth 2 (two) times daily with a meal.   Minerin Creme Crea Apply 1 application topically 2 (two) times daily as needed (dryness).   nitroGLYCERIN 0.4 MG SL tablet Commonly known as: NITROSTAT Place 0.4 mg under the tongue every 5 (five) minutes x 3 doses as needed for chest pain.   oxyCODONE 5 MG immediate release tablet Commonly known as: Oxy  IR/ROXICODONE Take 1-2 tablets (5-10 mg total) by mouth every 4 (four) hours as needed for moderate pain (pain score 4-6).   Oxycodone HCl 10 MG Tabs Take 1-1.5 tablets (10-15 mg total) by mouth every 4 (four) hours as needed for severe pain (pain score 7-10).   pantoprazole 40 MG tablet Commonly known as: PROTONIX Take 1 tablet (40 mg total) by mouth 2 (two) times daily.   Repatha Pushtronex System 420 MG/3.5ML Soct Generic drug: Evolocumab with Infusor Inject 420 mg into the skin every 30 (thirty) days.   senna-docusate 8.6-50 MG tablet Commonly known as: Senokot-S Take 2 tablets by mouth daily.   sucralfate 1 g tablet Commonly known as: CARAFATE Take 1 tablet (1 g total) by mouth 4 (four) times daily -  with meals and at bedtime.   traZODone 50 MG tablet Commonly known as: DESYREL Take 50 mg by mouth at bedtime.   vitamin B-12 1000 MCG tablet Commonly known as: CYANOCOBALAMIN Take 1,000 mcg by mouth daily.   Vitamin D-3 25 MCG (1000 UT) Caps Take 1,000 Units by mouth daily.        Disposition and follow-up:   [name] was discharged from [facility] in Stable condition.  At the hospital follow up visit please address:  1.  Urinary retention/UTI- make sure patient has been able to get urology follow up for voiding trial, assess for any suprapubic pain/dysuria  2. Diabetes- became hypoglycemic at last admission, adjust medications as you see fit  3. Normocytic Anemia - given CKD may benefit from EPO, recheck hemoglobin  4. Deconditioning, functional decline- assess how patient is doing with rehab  5. Sacral Ulcer- recheck wound  6. Depression: assess mental health given lifestyle change/loss of some degree of independence  7. CKD- recheck creatinine  8. HTN- add back on other medications as needed  9.  Labs / imaging needed at time of follow-up: cbc, bmp  10.  Pending labs/ test needing follow-up: none  Follow-up  Appointments:   Hospital Course by problem list: 1. Suspected UTI in the setting of indwelling foley; Urinary retention Patient came in with dysuria in the setting of an indwelling foley placed 12/6. UA with moderate hemoglobin, large leukocytes, many bacteria concerning for infection, mild leukocytosis to 10.7 but afebrile. Cultures grew 1,000 colonies of E. Coli, he received a 3 day course of ceftriaxone. A voiding trial was also attempted however the patient was unable to void with >600 seen on bladder scan and then I/O. A coude cath was placed.  He will need outpatient urology follow up upon discharge.  2.Acute on chronic kidney disease Patient's creatinine elevated to 2.0 on admission though this improved after fluids, baseline of 1-1.2. Renal ultrasound with cortical thinning on the right without hydronephrosis or obstruction, though during the voiding trial he did have another increase in his creatinine to 1.5 though this was likely secondary to urinary obstruction and should continue to improve with foley  in place.  3.Hypoglycemia; DMII Patient presented hypoglycemia early in admission with CBGs in the 84s to 65s.  He was started on 5% LR infusion overnight with improvement in his CBGs.  Likely hypoglycemic in the setting of worsening kidney function and large dose of home long acting insulin. This improved and the D5 was stopped. He was kept on sliding scale insulin and his long acting will continue to be held.   4.Normocytic Anemia  Patient had a recent upper GI bleed due a duodenal ulcer at a prior admission. Ferritin of 149 with iron of 45 and TIBC of 200.  Likely has component of anemia of chronic renal disease and chronic inflammation contributing to his anemia.  Received 1 unit of blood and hemoglobin stabilized mid 8s.  No further signs of GI bleeding during this admission. Likely a component of anemia of chronic disease, may benefit from EPO as outpatient with  nephro.  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  Most recently was admitted in October to November for septic arthritis of the left knee ultimately requiring left AKA.  His wound has been healing well.  [facility] noted that he has had worsening cognition and deconditioning since his most recent hospitalization last month.  Patient lives alone and has little social support and difficult home situation. Palliative/GOC were started though not much progress has changed.   7.COPD Continued home albuterol inhaler  8.Sacral Ulcer Continued wound care and offloading with turning  9.HTN;HLD Holding home BP meds at this time 2/2 hypotension and AKI.  Home medications are amlodipine  10 mg daily, Hyzaar 100-25 mg daily and metoprolol 200 mg daily.  Has history of statin intolerance on Repatha last dose was 12/5. Will continue to hold other antihypertensives and discharge on amlodipine 10 daily.  10.Gout Continued home allopurinol  11. Depression Continued home trazadone and lexapro 10 daily  Discharge Exam:   BP 124/67 (BP Location: Left Arm)   Pulse 99   Temp 98.4 F (36.9 C) (Oral)   Resp 18   Wt 81.8 kg   SpO2 100%   BMI 29.11 kg/m   Constitutional: Obese chronically ill-appearing.  In no acute distress Cardiovascular: Normal rate, regular rhythm, S1 and S2 present, no murmurs, rubs, gallops.  Respiratory: Normal work of breathing on room air GI: Nondistended, soft, nontender  Musculoskeletal: Right BKA.  Left AKA. Distal extremities  warm  Neurological: Is alert and oriented, no apparent focal deficits noted. Skin: Warm and dry.   Psychiatric: Normal mood and affect.   Pertinent Labs, Studies, and Procedures:   US RENAL  Result Date: 10/05/2021 CLINICAL DATA:  Acute kidney injury. EXAM:  RENAL / URINARY TRACT ULTRASOUND COMPLETE COMPARISON:  August 19, 2021. FINDINGS: Right Kidney: Renal measurements: 9.3 x 5.1 x 5.2 cm = volume: 130 mL. Mild cortical thinning is noted. Echogenicity within normal limits. No mass or hydronephrosis visualized. Left Kidney: Renal measurements: 11.5 x 6.5 x 6.2 cm = volume: 239 mL. Echogenicity within normal limits. No mass or hydronephrosis visualized. Bladder: Decompressed secondary to Foley catheter. Other: None. IMPRESSION: Mild right renal cortical thinning is noted. No hydronephrosis or renal obstruction is noted. Electronically Signed   By: Marijo Conception M.D.   On: 10/05/2021 09:05   DG Chest Portable 1 View  Result Date: 10/04/2021 CLINICAL DATA:  Low hemoglobin.  UTI. EXAM: PORTABLE CHEST 1 VIEW COMPARISON:  08/18/2021 FINDINGS: 1549 hours. The lungs are clear without focal pneumonia, edema, pneumothorax or pleural effusion. Cardiopericardial silhouette is at upper limits of normal for size. The visualized bony structures of the thorax show no acute abnormality. Telemetry leads overlie the chest. IMPRESSION: No active disease. Electronically Signed   By: Misty Stanley M.D.   On: 10/04/2021 16:03    CBC Latest Ref Rng & Units 10/07/2021 10/06/2021 10/05/2021  WBC 4.0 - 10.5 K/uL 7.3 7.2 7.0  Hemoglobin 13.0 - 17.0 g/dL 8.9(L) 8.6(L) 7.6(L)  Hematocrit 39.0 - 52.0 % 27.9(L) 27.3(L) 24.3(L)  Platelets 150 - 400 K/uL 228 225 202    BMP Latest Ref Rng & Units 10/07/2021 10/06/2021 10/05/2021  Glucose 70 - 99 mg/dL 91 85 150(H)  BUN 8 - 23 mg/dL 33(H) 36(H) 70(H)  Creatinine 0.61 - 1.24 mg/dL 1.50(H) 1.28(H) 1.62(H)  Sodium 135 - 145 mmol/L 136 136 134(L)  Potassium 3.5 - 5.1 mmol/L 5.0 4.5 4.2  Chloride 98 - 111 mmol/L 101 104 105  CO2 22 - 32 mmol/L 27 24 19(L)  Calcium 8.9 - 10.3 mg/dL 8.8(L) 8.8(L) 8.1(L)    Discharge Instructions: Discharge Instructions     Call MD for:  difficulty breathing, headache or visual disturbances   Complete  by: As directed    Call MD for:  redness, tenderness, or signs of infection (pain, swelling, redness, odor or green/yellow discharge around incision site)   Complete by: As directed    Call MD for:  severe uncontrolled pain   Complete by: As directed    Call MD for:  temperature >100.4   Complete by: As directed    Diet - low sodium heart healthy   Complete by: As directed    Increase activity slowly   Complete by: As directed    No wound care   Complete by: As directed        Signed: Scarlett Presto, MD 10/07/2021, 2:38 PM   Pager: [phone]

## 2021-10-07 NOTE — Progress Notes (Signed)
82F Coude Foley inserted. Yellow cloudy urine return noted. Split noted at glans penis. Pt tolerated well. No complications noted.  Sheela Stack, LPN

## 2021-10-07 NOTE — Discharge Instructions (Signed)
Leslie [name]  You were recently admitted to [facility] for a urinary tract infection. We gave you IV fluids to help prevent dehydration as well as antibiotics   Continue taking your home medications with the following changes  Stop taking carbemazepine, ciprofloxacin, duloxetine, famotidine, losartan, metoprolol and mirtazapine Start taking protonix 40 mg BID Continue taking insulin as needed with meals   You should seek further medical care if you have increasing pain at the site of your catheter, pelvic pain, burning with urination, or fevers.  We recommend that you see your primary care doctor in about a week to make sure that you continue to improve. We are so glad that you are feeling better.  Sincerely, Scarlett Presto, MD

## 2021-10-07 NOTE — Progress Notes (Addendum)
HD#3 Subjective:  Overnight Events: NAEO   Patient still really feels like he needs to pee but is unable to do so. He is frustrated that he is in the hospital/rehab and really wants to go home. He feels like he is just laying in the bed and not doing anything that he could not do at home.   Objective:  Vital signs in last 24 hours: Vitals:   10/06/21 1000 10/06/21 1540 10/06/21 1950 10/07/21 0435  BP: (!) 179/66 (!) 154/90 (!) 144/78 (!) 149/69  Pulse: 89 91 85 89  Resp: 17 18 16 16   Temp: 97.8 F (36.6 C) 98.1 F (36.7 C) 98 F (36.7 C) 97.6 F (36.4 C)  TempSrc: Oral Oral Oral Oral  SpO2: 100% 100% 100% 100%  Weight:       Supplemental O2: Room Air SpO2: 100 %   Physical Exam:  Constitutional: Obese chronically ill-appearing.  In no acute distress Cardiovascular: Normal rate, regular rhythm, S1 and S2 present, no murmurs, rubs, gallops.  Respiratory: Normal work of breathing on room air GI: Nondistended, soft, nontender  Musculoskeletal: Right BKA.  Left AKA. Distal extremities  warm  Neurological: Is alert and oriented, no apparent focal deficits noted. Skin: Warm and dry.   Psychiatric: Normal mood and affect.   Filed Weights   10/04/21 1546  Weight: 81.8 kg     Intake/Output Summary (Last 24 hours) at 10/07/2021 0701 Last data filed at 10/06/2021 2054 Gross per 24 hour  Intake 203 ml  Output 1400 ml  Net -1197 ml   Net IO Since Admission: -4,082 mL [10/07/21 0701]  Pertinent Labs: CBC Latest Ref Rng & Units 10/07/2021 10/06/2021 10/05/2021  WBC 4.0 - 10.5 K/uL 7.3 7.2 7.0  Hemoglobin 13.0 - 17.0 g/dL 8.9(L) 8.6(L) 7.6(L)  Hematocrit 39.0 - 52.0 % 27.9(L) 27.3(L) 24.3(L)  Platelets 150 - 400 K/uL 228 225 202    CMP Latest Ref Rng & Units 10/07/2021 10/06/2021 10/05/2021  Glucose 70 - 99 mg/dL 91 85 150(H)  BUN 8 - 23 mg/dL 33(H) 36(H) 70(H)  Creatinine 0.61 - 1.24 mg/dL 1.50(H) 1.28(H) 1.62(H)  Sodium 135 - 145 mmol/L 136 136 134(L)  Potassium  3.5 - 5.1 mmol/L 5.0 4.5 4.2  Chloride 98 - 111 mmol/L 101 104 105  CO2 22 - 32 mmol/L 27 24 19(L)  Calcium 8.9 - 10.3 mg/dL 8.8(L) 8.8(L) 8.1(L)  Total Protein 6.5 - 8.1 g/dL - - -  Total Bilirubin 0.3 - 1.2 mg/dL - - -  Alkaline Phos 38 - 126 U/L - - -  AST 15 - 41 U/L - - -  ALT 0 - 44 U/L - - -    Imaging: No results found.  Assessment/Plan:   Principal Problem:   Symptomatic anemia Active Problems:   Diabetes mellitus ([place])   AKI (acute kidney injury) ([place])   Severe protein-calorie malnutrition ([place])   Hypoglycemia   Urine retention   Pressure injury of skin   Patient Summary: Leslie [name] is a 75 y.o. with a pertinent PMH of T2DM c/b neuropathy, HLD, HTN, COPD, sacral pressure ulcer,  bilateral BKAs,  recent upper GI bleed 2/2 duodenal ulcer, and an indwelling foley who presented with burning with urination and admitted for UTI and symptomatic anemia complicated by acute on chronic kidney disease  #Suspected UTI in the setting of indwelling foley; resolved #Urinary retention Patient coming in with dysuria in the setting of an indwelling foley placed on 12/6 due to concerns for  urinary retention. Urine culture with 1K colonies of e. Coli.  Patient has completed course of antibiotics. Voiding trial was attempted over the weekend however patient has been retaining >600 x2. Coude cath placed today -will need outpatient urology follow up  #Acute on chronic kidney disease Likely in setting of poor p.o. intake in addition underlying chronic kidney.disease.  Renal ultrasound with cortical thinning on the right without hydronephrosis or obstruction, however he has been having urinary retention since [place] was completed, creatinine back up to 1.5. Coude cath as above. - Will refer to outpatient nephrology to consider Epo regimen   #Hypoglycemia-resolved #DMII Patient has been eating and drinking well during this hospitalization. His D5 infusion was discontinued and CBGs have been  stable. We will continue to monitor his blood sugar and restart his insulin as needed. -CBG 4 times daily  #Normocytic Anemia  Patient had a recent upper GI bleed due a duodenal ulcer at a prior admission. Ferritin of 149 with iron of 45 and TIBC of 200.  Likely has component of anemia of chronic renal disease and chronic inflammation contributing to his anemia. 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  Most recently was admitted in October to November for septic arthritis of the left knee ultimately requiring left AKA.  His wound has been healing well.  [facility] noted that he has had worsening cognition and deconditioning since his most recent hospitalization last month.  Patient lives alone and has little social support and difficult home situation. Will consult palliative to discuss goals of care giving downward trajectory of his health -PT OT recommend SNF -continue aspirin -palliative care consult for [place]   #COPD -continue albuterol inhaler   #Sacral Ulcer --Continue wound care - Off load with turning --Monitor for worsening condition   #HTN Previosuly home BP meds at this time 2/2 hypotension and AKI on admission but now Bps have been increasing. Home medications are amlodipine  10 mg daily, Hyzaar 100-25 mg daily and metoprolol 200 mg daily. Amlodipine 10 added back on yesterday, will CTM and add back more as needed.  #HLD Has history of statin intolerance on Repatha last dose was 12/5.   #Gout Continue allopurinol   #Depression - continue trazadone and Lexapro 10 mg daily    #Hyponatremia Chronic, stable --Daily BMP  Scarlett Presto, MD Internal Medicine Resident PGY-1 Pager [phone] Please contact the on call pager after 5 pm and on weekends at [phone].

## 2021-10-07 NOTE — NC FL2 (Signed)
[place] LEVEL OF CARE SCREENING TOOL     IDENTIFICATION  Patient Name: Leslie [name] Birthdate: [birth year] Sex: male Admission Date (Current Location): 10/04/2021  [facility] and [place] Number:  Herbalist and Address:  [facility] [place] [address] [place], [place] [address]      Provider Number: [id number]  Attending Physician Name and Address:  Axel Filler, *  Relative Name and Phone Number:       Current Level of Care: Hospital Recommended Level of Care: [place] Prior Approval Number:    Date Approved/Denied:   PASRR Number: [id number]  Discharge Plan: SNF    Current Diagnoses: Patient Active Problem List   Diagnosis Date Noted   Pressure injury of skin 10/06/2021   Hypoglycemia 10/05/2021   Urine retention 10/05/2021   Gastritis and gastroduodenitis    AKI (acute kidney injury) ([place])    Symptomatic anemia    Severe protein-calorie malnutrition ([place])    Solitary pulmonary nodule 08/14/2015   Peripheral arterial disease ([place]) 04/18/2015   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   Gout    Diabetic neuropathy ([place]) 03/31/2008   Rosacea 03/31/2008   GERD 01/31/2008   COPD (chronic obstructive pulmonary disease) ([place]) 08/03/2007   Diabetes mellitus ([place]) 05/08/2007   OBESITY 05/08/2007   HTN (hypertension) 05/08/2007   HERNIATED DISC 05/08/2007   Chronic lower back pain on narcotics  06/16/2005   HLD (hyperlipidemia) 03/22/2002    Orientation RESPIRATION BLADDER Height & Weight     Self, Time, Situation, Place  Normal Incontinent, External catheter Weight: 180 lb 5.4 oz (81.8 kg) Height:     BEHAVIORAL SYMPTOMS/MOOD NEUROLOGICAL BOWEL NUTRITION STATUS      Incontinent Diet (please see discharge summary)  AMBULATORY STATUS COMMUNICATION OF NEEDS Skin   Total Care Verbally Surgical wounds (pressure injury [place] Mid Stg II, Closed incision Left Leg)                       Personal Care  Assistance Level of Assistance  Bathing, Dressing, Feeding Bathing Assistance: Limited assistance Feeding assistance: Independent Dressing Assistance: Limited assistance     Functional Limitations Info  Sight, Hearing, Speech Sight Info: Adequate Hearing Info: Adequate Speech Info: Adequate    SPECIAL CARE FACTORS FREQUENCY  PT (By licensed PT), OT (By licensed OT)     PT Frequency: 5x a week OT Frequency: 5x a week            Contractures Contractures Info: Not present    Additional Factors Info  Code Status, Allergies Code Status Info: Full Allergies Info: Atorvastatin   Statins   Flexeril           Current Medications (10/07/2021):  This is the current hospital active medication list Current Facility-Administered Medications  Medication Dose Route Frequency Provider Last Rate Last Admin   0.9 %  sodium chloride infusion  250 mL Intravenous PRN Idamae Lusher, MD   Held at 10/04/21 2004   0.9 %  sodium chloride infusion  10 mL/hr Intravenous Once Idamae Lusher, MD       acetaminophen (TYLENOL) tablet 650 mg  650 mg Oral Q6H PRN [name], MD   650 mg at 10/07/21 1043   Or   acetaminophen (TYLENOL) suppository 650 mg  650 mg Rectal Q6H PRN [name], MD       albuterol (PROVENTIL) (2.5 MG/3ML) 0.083% nebulizer solution 2.5 mg  2.5 mg Nebulization Q6H PRN Lalla Brothers  Marcello Moores, MD       allopurinol (ZYLOPRIM) tablet 100 mg  100 mg Oral QHS [name], MD   100 mg at 10/06/21 2053   amLODipine (NORVASC) tablet 10 mg  10 mg Oral Daily [name], MD   10 mg at 10/07/21 1043   Chlorhexidine Gluconate Cloth 2 % PADS 6 each  6 each Topical Daily Axel Filler, MD   6 each at 10/07/21 1042   enoxaparin (LOVENOX) injection 30 mg  30 mg Subcutaneous Q24H [name], MD   30 mg at 10/06/21 2053   escitalopram (LEXAPRO) tablet 10 mg  10 mg Oral Daily [name], MD   10 mg at 10/07/21 1043   insulin aspart (novoLOG) injection 0-9 Units   0-9 Units Subcutaneous TID WC [name], MD   2 Units at 10/07/21 0836   lidocaine (XYLOCAINE) 2 % jelly 1 application  1 application Urethral Once Demaio, Alexa, MD       LORazepam (ATIVAN) tablet 0.25 mg  0.25 mg Oral Daily PRN [name], MD       oxyCODONE (Oxy IR/ROXICODONE) immediate release tablet 5 mg  5 mg Oral Q6H PRN [name], MD   5 mg at 10/07/21 0524   pantoprazole (PROTONIX) EC tablet 40 mg  40 mg Oral BID Alonza Bogus D, PA-C   40 mg at 10/07/21 1042   polyethylene glycol (MIRALAX / GLYCOLAX) packet 17 g  17 g Oral Daily PRN [name], MD       senna (SENOKOT) tablet 17.2 mg  2 tablet Oral QHS PRN [name], MD   17.2 mg at 10/06/21 1835   sodium chloride flush (NS) 0.9 % injection 3 mL  3 mL Intravenous Q12H Idamae Lusher, MD   3 mL at 10/07/21 1044   sodium chloride flush (NS) 0.9 % injection 3 mL  3 mL Intravenous PRN Idamae Lusher, MD       traZODone (DESYREL) tablet 50 mg  50 mg Oral QHS [name], MD   50 mg at 10/06/21 2053     Discharge Medications: Please see discharge summary for a list of discharge medications.  Relevant Imaging Results:  Relevant Lab Results:   Additional Information SSN [id number]  patient states he has received covid vaccine but no booster shot  [place], [place]

## 2021-10-07 NOTE — TOC Transition Note (Signed)
Transition of Care [facility]) - CM/SW Discharge Note   Patient Details  Name: Leslie [name] MRN: [id number] Date of Birth: Nov 23, 1945  Transition of Care [facility]) CM/SW Contact:  Emeterio Reeve, LCSW Phone Number: 10/07/2021, 3:28 PM   Clinical Narrative:      Per MD patient ready for DC to [facility]. RN, patient, patient's family, and facility notified of DC. Discharge Summary and FL2 sent to facility. DC packet on chart. [facility] has approved pt to return to [place].    RN to call report to [phone].  CSW will sign off for now as social work intervention is no longer needed. Please consult [place] again if new needs arise.   Final next level of care: Skilled Nursing Facility Barriers to Discharge: Barriers Resolved   Patient Goals and CMS Choice Patient states their goals for this hospitalization and ongoing recovery are:: return to SNF CMS Medicare.gov Compare Post Acute Care list provided to:: Patient Choice offered to / list presented to : Patient  Discharge Placement              Patient chooses bed at: [facility] Patient to be transferred to facility by: pace transport Name of family member notified: Pt alert x4, pt stated he will notify family Patient and family notified of of transfer: 10/07/21  Discharge Plan and Services                                     Social Determinants of Health (SDOH) Interventions     Readmission Risk Interventions No flowsheet data found.   Emeterio Reeve, LCSW Clinical Social Worker

## 2021-10-07 NOTE — Consult Note (Signed)
WOC Nurse Consult Note: Patient receiving care in [facility] 6N10 Reason for Consult: sacral wound and LAKA Wound type: Reddened area on the coccyx area that is blanchable. LAKA is healed, no interventions needed. Pressure Injury POA: NA Wound bed: pink Drainage (amount, consistency, odor) None Periwound: intact Dressing procedure/placement/frequency: Continue with sacral foam dressing. Assess daily under dressing. Change every 3 days or PRN soiling.   Monitor the wound area(s) for worsening of condition such as: Signs/symptoms of infection, increase in size, development of or worsening of odor, development of pain, or increased pain at the affected locations.   Notify the medical team if any of these develop.  Thank you for the consult. [place] nurse will not follow at this time.   Please re-consult the [place] team if needed.  Cathlean Marseilles Tamala Julian, MSN, RN, [place], Lysle Pearl, [facility] Wound Treatment Associate Pager [phone]

## 2021-10-07 NOTE — Consult Note (Signed)
Consultation Note Date: 10/07/2021   Patient Name: Leslie [name]  DOB: 18-Jan-1946  MRN: [id number]  Age / Sex: 75 y.o., male  PCP: [facility] [place] Referring Physician: Axel [name], *  Reason for Consultation: Establishing goals of care  HPI/Patient Profile: 75 y.o. male  admitted on 10/04/2021 with Leslie [name] is a 75 y.o. male with history of hypertension, hyperlipidemia, type 2 diabetes, COPD, PVD, s/p right BKA and recent admission in November for septic arthritis of the left knee status post left AKA, alcohol use disorder admitted for weakness.    Found to have a UTI and is being treated with antibiotics. Is a  resident in a skilled nursing facility over the past several months.  Plan initially was to retrun home but that seems less likely as he continues to fail to thrive.  Limited insight into his current medical and social situation.  [facility]ACE member--placed call await callback  Situation reflects importance to establish GOCs and plan of care.      Clinical Assessment and Goals of Care:  This NP Leslie [name] reviewed medical records, received report from team, assessed the patient and then meet at the patient's bedside  to discuss diagnosis, prognosis, GOC, EOL wishes disposition and options.  I worry that Leslie [name] does not have medical decision making capacity.  Insight is limited to his current medical and social situation and his ability to care for himself independently.    With patient's permission I spoke to his sister and main support person/Leslie [name] by telephone   Concept of Palliative Care was introduced as specialized medical care for people and their families living with serious illness.  If focuses on providing relief from the symptoms and stress of a serious illness.  The goal is to improve quality of life for both the patient and the  family.  Values and goals of care important to patient and family were attempted to be elicited.    A  discussion was had today regarding advanced directives.  Concepts specific to code status, artifical feeding and hydration, continued IV antibiotics and rehospitalization was had.  The difference between a aggressive medical intervention path  and a palliative comfort care path for this patient at this time was had.    MOST form discussed--hopefully PACE will help complete as patient is being discharged today    Questions and concerns addressed.  Patient /family  encouraged to call with questions or concerns.         No documented healthcare power of attorney or advanced care planning documents.  Patient's sister Leslie [name] is his main support and family  NEXT OF KIN    SUMMARY OF RECOMMENDATIONS    Code Status/Advance Care Planning: DNR-documented today     Palliative Prophylaxis:  Aspiration, Frequent Pain Assessment, and Oral Care  Additional Recommendations (Limitations, Scope, Preferences): Full Scope Treatment  Psycho-social/Spiritual:  Desire for further Chaplaincy support:no Additional Recommendations: Education on Hospice  Prognosis:  Unable to determine  Discharge Planning:  To Be Determined      Primary Diagnoses: Present on Admission:  Symptomatic anemia  Hypoglycemia  AKI (acute kidney injury) ([place])  Severe protein-calorie malnutrition ([place])  Urine retention   I have reviewed the medical record, interviewed the patient and family, and examined the patient. The following aspects are pertinent.  Past Medical History:  Diagnosis Date   Anxiety    Panic attack - 10  years aago- ? panic    Asthma    Cellulitis of left foot 2016   Healed and recurred 01/2016   Cellulitis of right foot 06/30/2012   Chronic lower back pain 07/01/2012   COPD (chronic obstructive pulmonary disease) ([place])    Uses nebulizers at home. Former smoker, current chewing tobacco.  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   GERD (gastroesophageal reflux disease)    Gouty arthritis 07/01/2012   Not attacks in ~5 yrs (03/2016)   Hepatitis 1967   "in jail when I got it; dr said it was pretty bad; quaranteened X 30d"   Hyperlipidemia    Hypertension    Lung nodule, solitary 07/2015   Due for 1-yr recheck in 07/2017   Peripheral arterial disease ([place])    critical limb ischemia   Peripheral neuropathy 07/01/2012   Seasonal allergies    Skin growth 07/01/2012   right side of nares; "been on there 20 years"   Substance abuse ([place])    Type II diabetes mellitus ([place]) 2002   Diagnosed in 2002   Social History   Socioeconomic History   Marital status: Widowed    Spouse name: Not on file   Number of children: Not on file   Years of education: Not on file   Highest education level: Not on file  Occupational History   Occupation: disabled  Tobacco Use   Smoking status: Former    Packs/day: 2.00    Years: 40.00    Pack years: 80.00    Types: Cigarettes    Quit date: 01/13/2002    Years since quitting: 19.7   Smokeless tobacco: Current    Types: Chew   Tobacco comments:    chews a little tobacco  Vaping Use   Vaping Use: Never used  Substance and Sexual Activity   Alcohol use: Not Currently    Comment: 03/03/18: "Used to drink alot. 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   Lives in assisted living. Performs ADLs and IADLs, including cooking and managing his medications. Friend drives him to the store. Uses rollator walker at home and motorized scooter in store.   [facility] patient   Widowed   5-8 years education   Likes to fish   Pets:  4 cockatiels and 2 lovebirds   Social Determinants of Radio broadcast assistant Strain: Not on file   Food Insecurity: Not on file  Transportation Needs: Not on file  Physical Activity: Not on file  Stress: Not on file  Social Connections: Not on file   Family History  Problem Relation Age of Onset   Diabetes Sister    Hypertension Mother    Scheduled Meds:  allopurinol  100 mg Oral QHS   amLODipine  10 mg Oral Daily   Chlorhexidine Gluconate Cloth  6 each Topical Daily   enoxaparin (LOVENOX) injection  30 mg Subcutaneous Q24H   escitalopram  10 mg Oral Daily   insulin aspart  0-9 Units Subcutaneous TID WC   pantoprazole  40 mg Oral BID   sodium chloride flush  3 mL Intravenous Q12H   traZODone  50 mg Oral QHS   Continuous Infusions:  sodium chloride Stopped (10/04/21 2004)   sodium chloride     PRN Meds:.sodium chloride, acetaminophen **OR** acetaminophen, albuterol, LORazepam, oxyCODONE, polyethylene glycol, senna, sodium chloride flush Medications Prior to Admission:  Prior to Admission medications   Medication Sig Start Date End Date Taking? Authorizing Provider  acetaminophen (TYLENOL) 325 MG tablet Take 2 tablets (650 mg total) by mouth every 6 (six) hours as needed for mild pain (or Fever >/= 101). Patient taking differently: Take 650 mg by mouth every 8 (eight) hours as needed for mild pain (or Fever >/= 101). 03/30/18  Yes Marcelyn Bruins, MD  albuterol (PROVENTIL HFA;VENTOLIN HFA) 108 (90 Base) MCG/ACT inhaler Inhale 2 puffs into the lungs 4 (four) times daily as needed for wheezing or shortness of breath.    Yes [provider]  albuterol (PROVENTIL) (2.5 MG/3ML) 0.083% nebulizer solution Take 2.5 mg by nebulization every 6 (six) hours as needed for wheezing or shortness of breath.   Yes [provider]  allopurinol (ZYLOPRIM) 100 MG tablet Take 100 mg by mouth at bedtime. For gout   Yes [provider]  AMINO ACIDS-PROTEIN HYDROLYS PO Take 1 Dose by mouth 2 (two) times daily. Add SF Pro-Stat   Yes [provider]  amLODipine  (NORVASC) 10 MG tablet Take 1 tablet (10 mg total) by mouth daily. 08/09/12  Yes Funches, Adriana Mccallum, MD  aspirin 81 MG chewable tablet Chew 81 mg by mouth daily.   Yes [provider]  calcium carbonate (TUMS EX) 750 MG chewable tablet Chew 1 tablet by mouth 2 (two) times daily as needed for heartburn.   Yes [provider]  Cholecalciferol (VITAMIN D-3) 1000 units CAPS Take 1,000 Units by mouth daily.   Yes [provider]  ciprofloxacin (CIPRO) 500 MG tablet Take 500 mg by mouth 2 (two) times daily. Every 14 (fourteen) days 10/03/21 10/17/21 Yes [provider]  escitalopram (LEXAPRO) 10 MG tablet Take 10 mg by mouth daily.   Yes [provider]  Evolocumab with Infusor ([place]) 420 MG/3.5ML SOCT Inject 420 mg into the skin every 30 (thirty) days.   Yes [provider]  famotidine (PEPCID) 20 MG tablet Take 20 mg by mouth at bedtime.   Yes [provider]  ferrous sulfate 325 (65 FE) MG tablet Take 325 mg by mouth every other day.   Yes [provider]  insulin glargine (LANTUS) 100 UNIT/ML injection Inject 75 Units into the skin daily.   Yes [provider]  LORazepam (ATIVAN) 0.5 MG tablet Take 0.25 mg by mouth daily as needed for anxiety (x2 months). 07/30/21 10/22/21 Yes [provider]  losartan-hydrochlorothiazide (HYZAAR) 100-25 MG tablet Take 1 tablet by mouth daily.   Yes [provider]  Melatonin 10 MG TABS Take 10 mg by mouth at bedtime.   Yes [provider]  metFORMIN (GLUCOPHAGE) 850 MG tablet Take 850 mg by mouth 2 (two) times daily with a meal.   Yes [provider]  metoprolol (TOPROL-XL) 200 MG 24 hr tablet Take 200 mg by mouth daily. Take with or immediately following a meal.  Yes [provider]  Multiple Vitamins-Minerals (CERTAVITE SENIOR/ANTIOXIDANT) TABS Take 1 tablet by mouth daily.   Yes [provider]  nitroGLYCERIN  (NITROSTAT) 0.4 MG SL tablet Place 0.4 mg under the tongue every 5 (five) minutes x 3 doses as needed for chest pain.  11/12/11  Yes Funches, Josalyn, MD  oxyCODONE (OXY IR/ROXICODONE) 5 MG immediate release tablet Take 1-2 tablets (5-10 mg total) by mouth every 4 (four) hours as needed for moderate pain (pain score 4-6). 09/13/21  Yes Timothy Lasso, MD  senna-docusate (SENOKOT-S) 8.6-50 MG tablet Take 2 tablets by mouth daily.   Yes [provider]  Skin Protectants, Misc. (MINERIN) CREA Apply 1 application topically 2 (two) times daily as needed (dryness).   Yes [provider]  traZODone (DESYREL) 50 MG tablet Take 50 mg by mouth at bedtime.   Yes [provider]  vitamin B-12 (CYANOCOBALAMIN) 1000 MCG tablet Take 1,000 mcg by mouth daily.   Yes [provider]  ascorbic acid (VITAMIN C) 1000 MG tablet Take 1 tablet (1,000 mg total) by mouth daily. Patient not taking: Reported on 10/04/2021 09/14/21   Gaylan Gerold, DO  carbamazepine (TEGRETOL) 200 MG tablet Take 1 tablet (200 mg total) by mouth daily at 12 noon. Patient not taking: Reported on 10/04/2021 09/14/21   Gaylan Gerold, DO  DULoxetine (CYMBALTA) 60 MG capsule Take 60 mg by mouth daily. Patient not taking: Reported on 08/22/2021    [provider]  folic acid (FOLVITE) 1 MG tablet Take 1 tablet (1 mg total) by mouth daily. 09/14/21   Gaylan Gerold, DO  hydrALAZINE (APRESOLINE) 25 MG tablet Take 1 tablet (25 mg total) by mouth every 8 (eight) hours. 09/13/21   Gaylan Gerold, DO  losartan (COZAAR) 100 MG tablet Take 100 mg by mouth daily. Patient not taking: Reported on 08/22/2021    [provider]  metoprolol succinate (TOPROL-XL) 100 MG 24 hr tablet Take 1 tablet (100 mg total) by mouth daily. Take with or immediately following a meal. Patient not taking: Reported on 10/04/2021 09/14/21 10/14/21  Gaylan Gerold, DO  mirtazapine (REMERON) 7.5 MG tablet Take 7.5 mg by mouth at  bedtime. Patient not taking: Reported on 08/22/2021    [provider]  oxyCODONE 10 MG TABS Take 1-1.5 tablets (10-15 mg total) by mouth every 4 (four) hours as needed for severe pain (pain score 7-10). Patient not taking: Reported on 10/04/2021 09/13/21   Timothy Lasso, MD  sucralfate (CARAFATE) 1 g tablet Take 1 tablet (1 g total) by mouth 4 (four) times daily -  with meals and at bedtime. Patient not taking: Reported on 10/04/2021 09/13/21   Gaylan Gerold, DO   Allergies  Allergen Reactions   Atorvastatin Other (See Comments)    Unknown reaction   Statins Other (See Comments)    Unknown reaction - on MAR   Flexeril [Cyclobenzaprine] Other (See Comments)    Dry mouth and hallucinations   Review of Systems  Physical Exam Cardiovascular:     Rate and Rhythm: Normal rate.  Skin:    General: Skin is warm and dry.  Neurological:     Mental Status: He is alert.    Vital Signs: BP 124/67 (BP Location: Left Arm)   Pulse 99   Temp 98.4 F (36.9 C) (Oral)   Resp 18   Wt 81.8 kg   SpO2 100%   BMI 29.11 kg/m  Pain Scale: 0-10   Pain Score: 4    SpO2: SpO2: 100 %  O2 Device:SpO2: 100 % O2 Flow Rate: .   IO: Intake/output summary:  Intake/Output Summary (Last 24 hours) at 10/07/2021 1006 Last data filed at 10/07/2021 7182 Gross per 24 hour  Intake 243 ml  Output 1400 ml  Net -1157 ml    LBM: Last BM Date: 10/07/21 Baseline Weight: Weight: 81.8 kg Most recent weight: Weight: 81.8 kg     Palliative Assessment/Data: 30 % at best   Dismissed with attending   and [facility]  Time In: 0830 Time Out: 0940 Time Total: 70 minutes Greater than 50%  of this time was spent counseling and coordinating care related to the above assessment and plan.  Signed by: Leslie Lessen, NP   Please contact Palliative Medicine Team phone at [phone] for questions and concerns.  For individual provider: See Shea Evans

## 2021-10-08 LAB — TYPE AND SCREEN
ABO/RH(D): O NEG
Antibody Screen: POSITIVE
Unit division: 0
Unit division: 0

## 2021-10-08 LAB — BPAM RBC
Blood Product Expiration Date: 202212162359
Blood Product Expiration Date: 202212212359
ISSUE DATE / TIME: 202212091830
Unit Type and Rh: 9500
Unit Type and Rh: 9500

## 2021-10-09 LAB — CULTURE, BLOOD (ROUTINE X 2)
Culture: NO GROWTH
Special Requests: ADEQUATE

## 2021-10-10 LAB — CULTURE, BLOOD (ROUTINE X 2)
Culture: NO GROWTH
Special Requests: ADEQUATE

## 2021-10-13 NOTE — Progress Notes (Addendum)
Cardiology Office Note   Date:  10/14/2021   ID:  JOSEPHUS HARRIGER, DOB [birth year], MRN [id number]  PCP:  [facility] [place]  Cardiologist:  Dr. Gwenlyn Found   Chief Complaint: hospital follow-up PAD and hypertension   History of Present Illness: Leslie [name] is a 75 y.o. male who presents for ongoing assessment and management of PAD followed by Dr. Gwenlyn Found.  He is disabled and currently lives in independent living environment, due to chronic back issues.  He has history of type 2 diabetes, hypertension, and hyperlipidemia, peripheral neuropathy, bilateral LE amputations.  He also has a history of COVID 19 and 08/15/2020 and did meet criteria for monoclonal antibody infusion.  He did have ABIs performed on 03/27/2015 which revealed a right ABI of 0.8 and a left of 0.78, and Dr. Gwenlyn Found felt that these were artificially elevated because of calcified noncompressible vessels.  Repeated ABI on 05/11/2015 revealed an ABI of 0.8 on the right with high-frequency signal in the proximal right SFA and popliteal artery, left ABI 0.88 with hypertensive single in his mid left FSA.  The patient subsequently had a left BKA by Dr. Sharol Given on 04/11/2016.  He wears a prosthetic leg and uses a walker for ambulation  He was last seen in the office by Dr. Gwenlyn Found on 01/15/2018 for further evaluation of a right third toe which at that time appeared gangrenous.  Dr. Gwenlyn Found had performed an angiogram on 08/13/2015 revealing high-grade diffusely calcified right FSA from the origin down to the abductor canal with three-vessel runoff.  He also noted that recent Dopplers performed on 04/27/2017 revealed a right ABI of 0.85 with an occluded right FSA.  Dr. Gwenlyn Found did not think he had percutaneous options for revascularization, and he did not believe he was a candidate for femoropopliteal grafting.  Dr. Gwenlyn Found spoke with Dr. Sharol Given and recommended toe amputation with aggressive wound care. Since that time, had a septic left knee  that was initially managed with I & D but subsequently led to further amputation of that extremity. His lengthy hospitalization and rehabilitation have been complicated by anemia and CKD.   Today, he is here alone for post-hospital follow-up. Reports that he is currently at [facility] for physical rehab but will be discharged home in 2 days. Has many challenges at home with an apartment that is not handicap accessible and strained family relationships. He lives alone but has neighbors who check on him. He denies chest pain, shortness of breath, fatigue, palpitations, melena, hematuria, hemoptysis, diaphoresis, weakness, presyncope, syncope, orthopnea, and PND. He has bilateral LE (right BKA, left AKA) amputations and reports phantom pain in both areas. He reports he has not had a Repatha injection in 2 months.  His medications are packaged for him by [facility] so he is unsure of everything that he takes.  He does not have a home blood pressure cuff for monitoring and is unsure of BP readings at the facility.   Past Medical History:  Diagnosis Date   Anxiety    Panic attack - 10  years aago- ? panic    Asthma    Cellulitis of left foot 2016   Healed and recurred 01/2016   Cellulitis of right foot 06/30/2012   Chronic lower back pain 07/01/2012   COPD (chronic obstructive pulmonary disease) ([place])    Uses nebulizers at home. Former smoker, current chewing tobacco. 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   GERD (gastroesophageal reflux disease)    Gouty arthritis 07/01/2012   Not attacks  in ~5 yrs (03/2016)   Hepatitis 1967   "in jail when I got it; dr said it was pretty bad; quaranteened X 30d"   Hyperlipidemia    Hypertension    Lung nodule, solitary 07/2015   Due for 1-yr recheck in 07/2017   Peripheral arterial disease ([place])    critical limb ischemia   Peripheral neuropathy 07/01/2012   Seasonal allergies    Skin growth 07/01/2012   right side of nares; "been on there 20 years"   Substance abuse ([place])    Type II diabetes  mellitus ([place]) 2002   Diagnosed in 2002    Past Surgical History:  Procedure Laterality Date   AMPUTATION Left 03/28/2016   Procedure: AMPUTATION left foot 4th and 5 th ray;  Surgeon: Newt Minion, MD;  Location: [place];  Service: Orthopedics;  Laterality: Left;   AMPUTATION Left 04/11/2016   Procedure: LEFT BELOW KNEE AMPUTATION;  Surgeon: Newt Minion, MD;  Location: [place];  Service: Orthopedics;  Laterality: Left;   AMPUTATION Right 01/22/2018   Procedure: RIGHT FOOT 3RD RAY AMPUTATION;  Surgeon: Newt Minion, MD;  Location: [place];  Service: Orthopedics;  Laterality: Right;   AMPUTATION Right 02/19/2018   Procedure: RIGHT TRANSMETATARSAL AMPUTATION;  Surgeon: Newt Minion, MD;  Location: [place];  Service: Orthopedics;  Laterality: Right;   AMPUTATION Right 03/28/2018   Procedure: RIGHT BELOW KNEE AMPUTATION;  Surgeon: Newt Minion, MD;  Location: [place];  Service: Orthopedics;  Laterality: Right;   AMPUTATION Left 09/11/2021   Procedure: LEFT ABOVE KNEE AMPUTATION;  Surgeon: Newt Minion, MD;  Location: [place];  Service: Orthopedics;  Laterality: Left;   BIOPSY  08/25/2021   Procedure: BIOPSY;  Surgeon: Daryel November, MD;  Location: [facility] ENDOSCOPY;  Service: Gastroenterology;;   ESOPHAGOGASTRODUODENOSCOPY Left 09/05/2021   Procedure: ESOPHAGOGASTRODUODENOSCOPY (EGD);  Surgeon: Lavena Bullion, DO;  Location: [facility] ENDOSCOPY;  Service: Gastroenterology;  Laterality: Left;   ESOPHAGOGASTRODUODENOSCOPY (EGD) WITH PROPOFOL N/A 08/25/2021   Procedure: ESOPHAGOGASTRODUODENOSCOPY (EGD) WITH PROPOFOL;  Surgeon: Daryel November, MD;  Location: [place];  Service: Gastroenterology;  Laterality: N/A;   HEMOSTASIS CLIP PLACEMENT  09/05/2021   Procedure: HEMOSTASIS CLIP PLACEMENT;  Surgeon: Lavena Bullion, DO;  Location: [place];  Service: Gastroenterology;;   HOT HEMOSTASIS  08/25/2021   Procedure: HOT HEMOSTASIS (ARGON PLASMA COAGULATION/BICAP);  Surgeon: Daryel November,  MD;  Location: [facility] ENDOSCOPY;  Service: Gastroenterology;;   HOT HEMOSTASIS N/A 09/05/2021   Procedure: HOT HEMOSTASIS (ARGON PLASMA COAGULATION/BICAP);  Surgeon: Lavena Bullion, DO;  Location: [facility] ENDOSCOPY;  Service: Gastroenterology;  Laterality: N/A;   I & D EXTREMITY Left 08/19/2021   Procedure: IRRIGATION AND DEBRIDEMENT LEFT KNEE;  Surgeon: Paralee Cancel, MD;  Location: [place];  Service: Orthopedics;  Laterality: Left;   I & D EXTREMITY Left 08/29/2021   Procedure: IRRIGATION AND DEBRIDEMENT Left Knee;  Surgeon: Paralee Cancel, MD;  Location: [place];  Service: Orthopedics;  Laterality: Left;   LEG AMPUTATION BELOW KNEE Left 04/11/2016   LOWER EXTREMITY ANGIOGRAM N/A 04/18/2013   Procedure: LOWER EXTREMITY ANGIOGRAM;  Surgeon: Sherren Mocha, MD;  Location: [facility] CATH LAB;  Service: Cardiovascular;  Laterality: N/A;   LOWER EXTREMITY ANGIOGRAM  08/13/2015   bilateral iliac    PERIPHERAL VASCULAR CATHETERIZATION Bilateral 08/13/2015   Procedure: Lower Extremity Angiography;  Surgeon: Lorretta Harp, MD;  Location: [place] CV LAB;  Service: Cardiovascular;  Laterality: Bilateral;   PERIPHERAL VASCULAR CATHETERIZATION N/A 08/13/2015  Procedure: Abdominal Aortogram;  Surgeon: Lorretta Harp, MD;  Location: [place] CV LAB;  Service: Cardiovascular;  Laterality: N/A;   SCLEROTHERAPY  08/25/2021   Procedure: SCLEROTHERAPY;  Surgeon: Daryel November, MD;  Location: [facility] ENDOSCOPY;  Service: Gastroenterology;;   Clide Deutscher  09/05/2021   Procedure: Clide Deutscher;  Surgeon: Lavena Bullion, DO;  Location: [facility] ENDOSCOPY;  Service: Gastroenterology;;   TOE AMPUTATION Right 01/22/2018   3RD TOE RIGHT FOOT     Current Outpatient Medications  Medication Sig Dispense Refill   acetaminophen (TYLENOL) 325 MG tablet Take 2 tablets (650 mg total) by mouth every 6 (six) hours as needed for mild pain (or Fever >/= 101). (Patient taking differently: Take 650 mg by mouth every 8 (eight) hours as  needed for mild pain (or Fever >/= 101).) 30 tablet 0   albuterol (PROVENTIL HFA;VENTOLIN HFA) 108 (90 Base) MCG/ACT inhaler Inhale 2 puffs into the lungs 4 (four) times daily as needed for wheezing or shortness of breath.      albuterol (PROVENTIL) (2.5 MG/3ML) 0.083% nebulizer solution Take 2.5 mg by nebulization every 6 (six) hours as needed for wheezing or shortness of breath.     allopurinol (ZYLOPRIM) 100 MG tablet Take 100 mg by mouth at bedtime. For gout     AMINO ACIDS-PROTEIN HYDROLYS PO Take 1 Dose by mouth 2 (two) times daily. Add SF Pro-Stat     amLODipine (NORVASC) 10 MG tablet Take 1 tablet (10 mg total) by mouth daily. 90 tablet 3   ascorbic acid (VITAMIN C) 1000 MG tablet Take 1 tablet (1,000 mg total) by mouth daily.     aspirin 81 MG chewable tablet Chew 81 mg by mouth daily.     calcium carbonate (TUMS EX) 750 MG chewable tablet Chew 1 tablet by mouth 2 (two) times daily as needed for heartburn.     Cholecalciferol (VITAMIN D-3) 1000 units CAPS Take 1,000 Units by mouth daily.     escitalopram (LEXAPRO) 10 MG tablet Take 10 mg by mouth daily.     Evolocumab with Infusor ([place]) 420 MG/3.5ML SOCT Inject 420 mg into the skin every 30 (thirty) days.     ferrous sulfate 325 (65 FE) MG tablet Take 325 mg by mouth every other day.     folic acid (FOLVITE) 1 MG tablet Take 1 tablet (1 mg total) by mouth daily.     insulin aspart (NOVOLOG) 100 UNIT/ML injection Inject 0-9 Units into the skin 3 (three) times daily with meals. 10 mL 11   LORazepam (ATIVAN) 0.5 MG tablet Take 0.25 mg by mouth daily as needed for anxiety (x2 months).     Melatonin 10 MG TABS Take 10 mg by mouth at bedtime.     metFORMIN (GLUCOPHAGE) 850 MG tablet Take 850 mg by mouth 2 (two) times daily with a meal.     Multiple Vitamins-Minerals (CERTAVITE SENIOR/ANTIOXIDANT) TABS Take 1 tablet by mouth daily.     nitroGLYCERIN (NITROSTAT) 0.4 MG SL tablet Place 0.4 mg under the tongue every 5 (five)  minutes x 3 doses as needed for chest pain.      oxyCODONE (OXY IR/ROXICODONE) 5 MG immediate release tablet Take 1-2 tablets (5-10 mg total) by mouth every 4 (four) hours as needed for moderate pain (pain score 4-6). 30 tablet 0   oxyCODONE 10 MG TABS Take 1-1.5 tablets (10-15 mg total) by mouth every 4 (four) hours as needed for severe pain (pain score 7-10). 30 tablet 0  pantoprazole (PROTONIX) 40 MG tablet Take 1 tablet (40 mg total) by mouth 2 (two) times daily. 60 tablet 1   senna-docusate (SENOKOT-S) 8.6-50 MG tablet Take 2 tablets by mouth daily.     Skin Protectants, Misc. (MINERIN) CREA Apply 1 application topically 2 (two) times daily as needed (dryness).     sucralfate (CARAFATE) 1 g tablet Take 1 tablet (1 g total) by mouth 4 (four) times daily -  with meals and at bedtime.     traZODone (DESYREL) 50 MG tablet Take 50 mg by mouth at bedtime.     vitamin B-12 (CYANOCOBALAMIN) 1000 MCG tablet Take 1,000 mcg by mouth daily.     No current facility-administered medications for this visit.    Allergies:   Atorvastatin, Statins, and Flexeril [cyclobenzaprine]    Social History:  The patient  reports that he quit smoking about 19 years ago. His smoking use included cigarettes. He has a 80.00 pack-year smoking history. His smokeless tobacco use includes chew. He reports that he does not currently use alcohol. He reports that he does not use drugs.   Family History:  The patient's family history includes Diabetes in his sister; Hypertension in his mother.    ROS: ++phantom limb pain. All other systems are reviewed and negative.   PHYSICAL EXAM: VS:  BP 132/62    Pulse (!) 104    Wt 171 lb (77.6 kg)    SpO2 97%    BMI 27.60 kg/m  , BMI Body mass index is 27.6 kg/m. GEN: Well nourished, well developed, in no acute distress HEENT: normal Neck: no JVD, carotid bruits, or masses Cardiac: RRR; no murmurs, rubs, or gallops,no edema  Respiratory:  Clear to auscultation bilaterally, normal  work of breathing GI: soft, nontender, nondistended, + BS MS: left AKA, right BKA, no deformity or atrophy Skin: warm and dry, no rash Neuro:  Strength and sensation are intact Psych: euthymic mood, full affect   EKG:  EKG is not ordered today.   Recent Labs: 08/19/2021: TSH 1.371 08/25/2021: Magnesium 2.2 10/04/2021: ALT 18 10/07/2021: BUN 33; Creatinine, Ser 1.50; Hemoglobin 8.9; Platelets 228; Potassium 5.0; Sodium 136    Lipid Panel    Component Value Date/Time   CHOL 133 08/19/2021 0529   TRIG 208 (H) 08/19/2021 0529   HDL <10 (L) 08/19/2021 0529   CHOLHDL NOT CALCULATED 08/19/2021 0529   VLDL 42 (H) 08/19/2021 0529   LDLCALC NOT CALCULATED 08/19/2021 0529   LDLDIRECT 96 11/12/2011 1601      Wt Readings from Last 3 Encounters:  10/14/21 171 lb (77.6 kg)  10/04/21 180 lb 5.4 oz (81.8 kg)  09/14/21 178 lb 9.2 oz (81 kg)      Other studies Reviewed: Echocardiogram 09-04-2021 1. Left ventricular ejection fraction, by estimation, is 60 to 65%. The  left ventricle has normal function. The left ventricle has no regional  wall motion abnormalities. There is mild concentric left ventricular  hypertrophy. Left ventricular diastolic  function could not be evaluated.   2. Right ventricular systolic function is normal. The right ventricular  size is normal. Tricuspid regurgitation signal is inadequate for assessing  PA pressure.   3. The mitral valve is degenerative. Trivial mitral valve regurgitation.  Mild mitral stenosis. Moderate to severe mitral annular calcification.   4. The aortic valve is grossly normal. Aortic valve regurgitation is not  visualized. No aortic stenosis is present.   5. The inferior vena cava is normal in size with greater than 50%  respiratory variability, suggesting right atrial pressure of 3 mmHg.   ASSESSMENT AND PLAN:  Essential hypertension: BP is stable today.  Antihypertensives were held during hospitalization 12/9 to 10/07/21.  He was  discharged on amlodipine 10 mg daily.  We were able to provide him with a blood pressure monitor for home use today. Provided him with a BP log and asked him to monitor BP daily and notify us if  BP > 135 mmHg on consistent basis. Continue amlodipine.   PAD: History of bilateral BKAs. 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 Will have him f/u with Dr. Gwenlyn Found in 6 months.   CKD Stage 3: Creatinine 1.5 on 10/07/21. He is not currently on ACE/ARB/MRA. If home BP readings are elevated, would favor resuming ARB as he was previously on Hyzaar with bmet 1 week after.  Tachycardia: HR initially at 104, improved with rest down to 88 bpm. He previously took metoprolol but it has been held since recent hospitalization. Do not feel that we need to resume beta blocker at this time but would favor restarting metoprolol if tachycardia persists.   Hyperlipidemia LDL goal < 70: Patient previously on [place]. He reports that he has not received an injection in 2 months. I spoke with Marzetta Board at [facility] and she reports that patient is to be discharged home on 10/16/21 and that she will follow up with getting Repatha restarted. She states they are planning to get a lipid panel in addition to other lab this week or next. Asked her to fax results to our office.    Disposition: Follow-up with Dr. Gwenlyn Found in 6 months

## 2021-10-14 ENCOUNTER — Encounter: Payer: Self-pay | Admitting: Adult Health

## 2021-10-14 ENCOUNTER — Other Ambulatory Visit: Payer: Self-pay

## 2021-10-14 ENCOUNTER — Ambulatory Visit (INDEPENDENT_AMBULATORY_CARE_PROVIDER_SITE_OTHER): Payer: Medicare (Managed Care) | Admitting: Nurse Practitioner

## 2021-10-14 VITALS — BP 132/62 | HR 104 | Wt 171.0 lb

## 2021-10-14 DIAGNOSIS — R000 Tachycardia, unspecified: Secondary | ICD-10-CM | POA: Diagnosis not present

## 2021-10-14 DIAGNOSIS — E1122 Type 2 diabetes mellitus with diabetic chronic kidney disease: Secondary | ICD-10-CM

## 2021-10-14 DIAGNOSIS — I10 Essential (primary) hypertension: Secondary | ICD-10-CM

## 2021-10-14 DIAGNOSIS — I739 Peripheral vascular disease, unspecified: Secondary | ICD-10-CM

## 2021-10-14 DIAGNOSIS — N183 Chronic kidney disease, stage 3 unspecified: Secondary | ICD-10-CM

## 2021-10-14 DIAGNOSIS — E785 Hyperlipidemia, unspecified: Secondary | ICD-10-CM | POA: Diagnosis not present

## 2021-10-14 NOTE — Patient Instructions (Signed)
Medication Instructions:  No Changes *If you need a refill on your cardiac medications before your next appointment, please call your pharmacy*   Lab Work: No Labs If you have labs (blood work) drawn today and your tests are completely normal, you will receive your results only by: [facility] (if you have MyChart) OR A paper copy in the mail If you have any lab test that is abnormal or we need to change your treatment, we will call you to review the results.   Testing/Procedures: No Testing   Follow-Up: At [facility], you and your health needs are our priority.  As part of our continuing mission to provide you with exceptional heart care, we have created designated Provider Care Teams.  These Care Teams include your primary Cardiologist (physician) and Advanced Practice Providers (APPs -  Physician Assistants and Nurse Practitioners) who all work together to provide you with the care you need, when you need it.  We recommend signing up for the patient portal called "MyChart".  Sign up information is provided on this After Visit Summary.  MyChart is used to connect with patients for Virtual Visits (Telemedicine).  Patients are able to view lab/test results, encounter notes, upcoming appointments, etc.  Non-urgent messages can be sent to your provider as well.   To learn more about what you can do with MyChart, go to [email or web address].    Your next appointment:   6 month(s)  The format for your next appointment:   In Person  Provider:   Quay Burow, MD    Other Instructions Monitor Blood Pressure Daily. Call office if Blood Pressure is over 130/80 on consistently.

## 2021-10-24 ENCOUNTER — Ambulatory Visit (INDEPENDENT_AMBULATORY_CARE_PROVIDER_SITE_OTHER): Payer: Medicare (Managed Care) | Admitting: Orthopedic Surgery

## 2021-10-24 ENCOUNTER — Other Ambulatory Visit (HOSPITAL_COMMUNITY): Payer: Self-pay

## 2021-10-24 DIAGNOSIS — G546 Phantom limb syndrome with pain: Secondary | ICD-10-CM

## 2021-10-24 DIAGNOSIS — Z89511 Acquired absence of right leg below knee: Secondary | ICD-10-CM

## 2021-10-24 DIAGNOSIS — Z89512 Acquired absence of left leg below knee: Secondary | ICD-10-CM

## 2021-10-24 MED ORDER — PREGABALIN 75 MG PO CAPS
75.0000 mg | ORAL_CAPSULE | Freq: Two times a day (BID) | ORAL | 0 refills | Status: AC
  Filled 2021-10-24: qty 60, 30d supply, fill #0

## 2021-10-24 MED ORDER — PREGABALIN 75 MG PO CAPS: 75.0000 mg | ORAL_CAPSULE | Freq: Two times a day (BID) | ORAL | 0 refills | Status: AC

## 2021-10-25 ENCOUNTER — Encounter: Payer: Self-pay | Admitting: Orthopedic Surgery

## 2021-10-25 NOTE — Progress Notes (Signed)
Office Visit Note   Patient: Leslie [name]           Date of Birth: Jan 13, 1946           MRN: [id number] Visit Date: 10/24/2021              Requested by: Inc, [place],  [place] [address] PCP: [facility], [employer]  Chief Complaint  Patient presents with   Left Leg - Routine Post Op    S/p left AKA 09/11/21      HPI: Patient is a 75 year old gentleman who is status post a right transtibial amputation and 6 weeks status post a left above-the-knee amputation.  Patient states he has been having increased phantom pain in both legs.  He states that he has taken Vicodin for 22 years and he has had no relief with oxycodone.  He states he has tried Neurontin and is currently on Lexapro.  Assessment & Plan: Visit Diagnoses:  1. S/P bilateral BKA (below knee amputation) ([place])   2. Phantom limb pain ([place])     Plan: We will try Lyrica twice a day.  Follow-Up Instructions: Return in about 4 weeks (around 11/21/2021).   Ortho Exam  Patient is alert, oriented, no adenopathy, well-dressed, normal affect, normal respiratory effort. Examination patient's left above-the-knee amputation is stable no open wounds no cellulitis no drainage.  Right below the knee amputation is also stable.  Imaging: No results found.   Labs: Lab Results  Component Value Date   HGBA1C 5.8 (H) 08/19/2021   HGBA1C 7.1 (H) 02/19/2018   HGBA1C 7.2 (H) 03/27/2016   ESRSEDRATE 90 (H) 03/27/2016   CRP 23.4 (H) 03/27/2016   LABURIC 4.8 08/09/2012   REPTSTATUS 10/10/2021 FINAL 10/05/2021   GRAMSTAIN  08/29/2021    NO SQUAMOUS EPITHELIAL CELLS SEEN ABUNDANT WBC SEEN FEW GRAM POSITIVE COCCI    CULT  10/05/2021    NO GROWTH 5 DAYS Performed at [facility], [place] [address], [place], [place] [address]    LABORGA ESCHERICHIA COLI (A) 10/04/2021     Lab Results  Component Value Date   ALBUMIN 2.2 (L) 10/04/2021   ALBUMIN  1.8 (L) 09/07/2021   ALBUMIN 1.9 (L) 09/06/2021   PREALBUMIN 13.8 (L) 03/27/2016    Lab Results  Component Value Date   MG 2.2 08/25/2021   MG 1.5 (L) 08/24/2021   No results found for: [facility]  Lab Results  Component Value Date   PREALBUMIN 13.8 (L) 03/27/2016   CBC EXTENDED Latest Ref Rng & Units 10/07/2021 10/06/2021 10/05/2021  WBC 4.0 - 10.5 K/uL 7.3 7.2 7.0  RBC 4.22 - 5.81 MIL/uL 3.09(L) 3.07(L) 2.65(L)  HGB 13.0 - 17.0 g/dL 8.9(L) 8.6(L) 7.6(L)  HCT 39.0 - 52.0 % 27.9(L) 27.3(L) 24.3(L)  PLT 150 - 400 K/uL 228 225 202  NEUTROABS 1.7 - 7.7 K/uL - - -  LYMPHSABS 0.7 - 4.0 K/uL - - -     There is no height or weight on file to calculate BMI.  Orders:  No orders of the defined types were placed in this encounter.  Meds ordered this encounter  Medications   pregabalin (LYRICA) 75 MG capsule    Sig: Take 1 capsule (75 mg total) by mouth 2 (two) times daily.    Dispense:  60 capsule    Refill:  0   pregabalin (LYRICA) 75 MG capsule    Sig: Take 1 capsule (  75 mg total) by mouth 2 (two) times daily.    Dispense:  60 capsule    Refill:  0     Procedures: No procedures performed  Clinical Data: No additional findings.  ROS:  All other systems negative, except as noted in the HPI. Review of Systems  Objective: Vital Signs: There were no vitals taken for this visit.  Specialty Comments:  No specialty comments available.  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 panic    Asthma    Cellulitis of left foot 2016   Healed and recurred 01/2016   Cellulitis of right foot 06/30/2012   Chronic lower back pain 07/01/2012   COPD (chronic obstructive pulmonary disease) ([place])    Uses nebulizers at home. Former smoker, current chewing tobacco. 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   GERD (gastroesophageal reflux disease)    Gouty arthritis 07/01/2012   Not attacks in ~5 yrs (03/2016)   Hepatitis 1967   "in jail when I got it; dr said it was pretty bad; quaranteened X 30d"   Hyperlipidemia    Hypertension    Lung nodule, solitary 07/2015   Due for 1-yr recheck in 07/2017   Peripheral arterial disease ([place])    critical limb ischemia   Peripheral neuropathy 07/01/2012   Seasonal allergies    Skin growth 07/01/2012   right side of nares; "been on there 20 years"   Substance abuse ([place])    Type II diabetes mellitus ([place]) 2002   Diagnosed in 2002    Family History  Problem Relation Age of Onset   Diabetes Sister    Hypertension Mother     Past Surgical History:  Procedure Laterality Date   AMPUTATION Left 03/28/2016   Procedure: AMPUTATION left foot 4th and 5 th ray;  Surgeon: Newt Minion, MD;  Location: [place];  Service: Orthopedics;  Laterality: Left;   AMPUTATION Left 04/11/2016   Procedure: LEFT BELOW KNEE AMPUTATION;  Surgeon: Newt Minion, MD;  Location: [place];  Service: Orthopedics;  Laterality: Left;   AMPUTATION Right 01/22/2018   Procedure: RIGHT FOOT 3RD RAY AMPUTATION;  Surgeon: Newt Minion, MD;  Location: [place];  Service: Orthopedics;  Laterality: Right;   AMPUTATION Right 02/19/2018   Procedure: RIGHT TRANSMETATARSAL AMPUTATION;  Surgeon: Newt Minion, MD;  Location: [place];  Service: Orthopedics;  Laterality: Right;   AMPUTATION Right 03/28/2018   Procedure: RIGHT BELOW KNEE AMPUTATION;  Surgeon: Newt Minion, MD;  Location: [place];  Service: Orthopedics;  Laterality: Right;   AMPUTATION Left 09/11/2021   Procedure: LEFT ABOVE KNEE AMPUTATION;  Surgeon: Newt Minion, MD;  Location: [facility]  OR;  Service: Orthopedics;  Laterality: Left;   BIOPSY  08/25/2021   Procedure: BIOPSY;  Surgeon: Daryel November, MD;  Location: [facility] ENDOSCOPY;  Service: Gastroenterology;;   ESOPHAGOGASTRODUODENOSCOPY Left 09/05/2021   Procedure: ESOPHAGOGASTRODUODENOSCOPY (EGD);  Surgeon: Lavena Bullion, DO;  Location[facility] ENDOSCOPY;  Service: Gastroenterology;  Laterality: Left;   ESOPHAGOGASTRODUODENOSCOPY (EGD) WITH PROPOFOL N/A 08/25/2021   Procedure: ESOPHAGOGASTRODUODENOSCOPY (EGD) WITH PROPOFOL;  Surgeon: Daryel November, MD;  Location: [place];  Service: Gastroenterology;  Laterality: N/A;   HEMOSTASIS CLIP PLACEMENT  09/05/2021   Procedure: HEMOSTASIS CLIP PLACEMENT;  Surgeon: Lavena Bullion, DO;  Location: [place];  Service: Gastroenterology;;   HOT HEMOSTASIS  08/25/2021   Procedure: HOT HEMOSTASIS (ARGON PLASMA COAGULATION/BICAP);  Surgeon: Daryel November, MD;  Location: [facility] ENDOSCOPY;  Service: Gastroenterology;;   HOT HEMOSTASIS N/A 09/05/2021   Procedure: HOT HEMOSTASIS (ARGON PLASMA COAGULATION/BICAP);  Surgeon: Lavena Bullion, DO;  Location: [facility] ENDOSCOPY;  Service: Gastroenterology;  Laterality: N/A;   I & D EXTREMITY Left 08/19/2021   Procedure: IRRIGATION AND DEBRIDEMENT LEFT KNEE;  Surgeon: Paralee Cancel, MD;  Location: [place];  Service: Orthopedics;  Laterality: Left;   I & D EXTREMITY Left 08/29/2021   Procedure: IRRIGATION AND DEBRIDEMENT Left Knee;  Surgeon: Paralee Cancel, MD;  Location: [place];  Service: Orthopedics;  Laterality: Left;   LEG AMPUTATION BELOW KNEE Left 04/11/2016   LOWER EXTREMITY ANGIOGRAM N/A  04/18/2013   Procedure: LOWER EXTREMITY ANGIOGRAM;  Surgeon: Sherren Mocha, MD;  Location: [facility] CATH LAB;  Service: Cardiovascular;  Laterality: N/A;   LOWER EXTREMITY ANGIOGRAM  08/13/2015   bilateral iliac    PERIPHERAL VASCULAR CATHETERIZATION Bilateral 08/13/2015   Procedure: Lower Extremity Angiography;  Surgeon: Lorretta Harp, MD;  Location: [place] CV LAB;  Service: Cardiovascular;  Laterality: Bilateral;   PERIPHERAL VASCULAR CATHETERIZATION N/A 08/13/2015   Procedure: Abdominal Aortogram;  Surgeon: Lorretta Harp, MD;  Location: [place] CV LAB;  Service: Cardiovascular;  Laterality: N/A;   SCLEROTHERAPY  08/25/2021   Procedure: SCLEROTHERAPY;  Surgeon: Daryel November, MD;  Location: [place];  Service: Gastroenterology;;   Clide Deutscher  09/05/2021   Procedure: Clide Deutscher;  Surgeon: Lavena Bullion, DO;  Location: [facility] ENDOSCOPY;  Service: Gastroenterology;;   TOE AMPUTATION Right 01/22/2018   3RD TOE RIGHT FOOT   Social History   Occupational History   Occupation: disabled  Tobacco Use   Smoking status: Former    Packs/day: 2.00    Years: 40.00    Pack years: 80.00    Types: Cigarettes    Quit date: 01/13/2002    Years since quitting: 19.7   Smokeless tobacco: Current    Types: Chew   Tobacco comments:    chews a little tobacco  Vaping Use   Vaping Use: Never used  Substance and Sexual Activity   Alcohol use: Not Currently    Comment: 03/03/18: "Used to drink alot. No alcohol in a long time."   Drug use: No    Types: Marijuana    Comment: 03/27/2016 "last marijuana was maybe in 2013"   Sexual activity: Never

## 2021-10-31 ENCOUNTER — Other Ambulatory Visit (HOSPITAL_COMMUNITY): Payer: Self-pay

## 2021-10-31 MED ORDER — TAMSULOSIN HCL 0.4 MG PO CAPS
0.4000 mg | ORAL_CAPSULE | ORAL | 5 refills | Status: AC
  Filled 2021-10-31: qty 30, 30d supply, fill #0

## 2021-11-05 ENCOUNTER — Ambulatory Visit: Payer: Medicare (Managed Care) | Admitting: Gastroenterology

## 2021-11-05 NOTE — Progress Notes (Deleted)
EGD 09/05/2021:   - Severe esophagitis with no bleeding. - Mild gastritis with luminal deformity of the distal stomach. The previously noted ulcers have since healed. - Active bleeding in the duodenal bulb. 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 This was successfully treated with a combination of bipolar cautery, hemostatic clips x3, then Epinephrine injection. - Non-bleeding duodenal ulcer with no stigmata of bleeding. - Normal second portion of the duodenum. - No specimens collected.  Seen by GI Dec 10th for anemia.  No overt GI blood loss, FOBT negative.   Anemia felt to be multifactorial.  No endoscopy recommended.  Recommended serial monitoring, transfusion as needed, endoscopy if active/overt GI bleed.

## 2021-11-20 ENCOUNTER — Ambulatory Visit: Payer: Medicare (Managed Care) | Admitting: Family

## 2021-12-25 ENCOUNTER — Other Ambulatory Visit: Payer: Self-pay

## 2021-12-25 ENCOUNTER — Emergency Department (HOSPITAL_COMMUNITY)
Admission: EM | Admit: 2021-12-25 | Discharge: 2021-12-26 | Disposition: A | Discharge status: Home / Self Care | Payer: Medicare (Managed Care) | Attending: Emergency Medicine | Admitting: Emergency Medicine

## 2021-12-25 DIAGNOSIS — R339 Retention of urine, unspecified: Secondary | ICD-10-CM | POA: Diagnosis present

## 2021-12-25 DIAGNOSIS — Z7984 Long term (current) use of oral hypoglycemic drugs: Secondary | ICD-10-CM | POA: Diagnosis not present

## 2021-12-25 DIAGNOSIS — Z794 Long term (current) use of insulin: Secondary | ICD-10-CM | POA: Diagnosis not present

## 2021-12-25 DIAGNOSIS — I10 Essential (primary) hypertension: Secondary | ICD-10-CM | POA: Diagnosis not present

## 2021-12-25 DIAGNOSIS — Z7982 Long term (current) use of aspirin: Secondary | ICD-10-CM | POA: Insufficient documentation

## 2021-12-25 DIAGNOSIS — E1140 Type 2 diabetes mellitus with diabetic neuropathy, unspecified: Secondary | ICD-10-CM | POA: Diagnosis not present

## 2021-12-25 DIAGNOSIS — E1151 Type 2 diabetes mellitus with diabetic peripheral angiopathy without gangrene: Secondary | ICD-10-CM | POA: Insufficient documentation

## 2021-12-25 DIAGNOSIS — Z79899 Other long term (current) drug therapy: Secondary | ICD-10-CM | POA: Diagnosis not present

## 2021-12-25 LAB — CBC WITH DIFFERENTIAL/PLATELET
Abs Immature Granulocytes: 0.02 10*3/uL (ref 0.00–0.07)
Basophils Absolute: 0 10*3/uL (ref 0.0–0.1)
Basophils Relative: 0 %
Eosinophils Absolute: 0.2 10*3/uL (ref 0.0–0.5)
Eosinophils Relative: 2 %
HCT: 29.3 % — ABNORMAL LOW (ref 39.0–52.0)
Hemoglobin: 9.1 g/dL — ABNORMAL LOW (ref 13.0–17.0)
Immature Granulocytes: 0 %
Lymphocytes Relative: 23 %
Lymphs Abs: 2.1 10*3/uL (ref 0.7–4.0)
MCH: 26.7 pg (ref 26.0–34.0)
MCHC: 31.1 g/dL (ref 30.0–36.0)
MCV: 85.9 fL (ref 80.0–100.0)
Monocytes Absolute: 0.9 10*3/uL (ref 0.1–1.0)
Monocytes Relative: 10 %
Neutro Abs: 5.8 10*3/uL (ref 1.7–7.7)
Neutrophils Relative %: 65 %
Platelets: 211 10*3/uL (ref 150–400)
RBC: 3.41 MIL/uL — ABNORMAL LOW (ref 4.22–5.81)
RDW: 14.5 % (ref 11.5–15.5)
WBC: 9.1 10*3/uL (ref 4.0–10.5)
nRBC: 0 % (ref 0.0–0.2)

## 2021-12-25 LAB — BASIC METABOLIC PANEL
Anion gap: 9 (ref 5–15)
BUN: 18 mg/dL (ref 8–23)
CO2: 24 mmol/L (ref 22–32)
Calcium: 8.9 mg/dL (ref 8.9–10.3)
Chloride: 102 mmol/L (ref 98–111)
Creatinine, Ser: 1.64 mg/dL — ABNORMAL HIGH (ref 0.61–1.24)
GFR, Estimated: 43 mL/min — ABNORMAL LOW (ref >60)
Glucose, Bld: 193 mg/dL — ABNORMAL HIGH (ref 70–99)
Potassium: 5 mmol/L (ref 3.5–5.1)
Sodium: 135 mmol/L (ref 135–145)

## 2021-12-25 LAB — URINALYSIS, ROUTINE W REFLEX MICROSCOPIC
Bilirubin Urine: NEGATIVE
Glucose, UA: 50 mg/dL — AB
Ketones, ur: NEGATIVE mg/dL
Leukocytes,Ua: NEGATIVE
Nitrite: NEGATIVE
Protein, ur: NEGATIVE mg/dL
Specific Gravity, Urine: 1.01 (ref 1.005–1.030)
pH: 6 (ref 5.0–8.0)

## 2021-12-25 MED ORDER — CEFDINIR 300 MG PO CAPS: 300.0000 mg | ORAL_CAPSULE | Freq: Two times a day (BID) | ORAL | 0 refills | Status: DC

## 2021-12-25 MED ORDER — CEFDINIR 300 MG PO CAPS
300.0000 mg | ORAL_CAPSULE | Freq: Two times a day (BID) | ORAL | 0 refills | Status: DC
Start: 2021-12-25 — End: 2021-12-25
  Filled 2021-12-25: qty 20, 10d supply, fill #0

## 2021-12-25 NOTE — ED Provider Notes (Signed)
?[facility] ?Provider Note ? ? ?CSN: [id number] ?Arrival date & time: 12/25/21  1758 ? ?  ? ?History ? ?Chief Complaint  ?Patient presents with  ? Urinary Retention  ? ? ?Zayden Maffei is a 76 y/o M with a hx of DMII, PAD, HTN, HLD, peripheral neuropathy who presents to the ED for urinary retention with suprapubic pain. Pt reports he has been unable to urinate since last night. He notes his home health nurse placed a catheter today but was unable to get any urine return. Of note, pt was evaluated in December 2022 for a urinary retention and a UTI that grew out E.coli that was pansensitive. Denies any other medical concerns. He has severe urgency to urinate. ? ?HPI ? ?  ? ?Home Medications ?Prior to Admission medications   ?Medication Sig Start Date End Date Taking? Authorizing Provider  ?acetaminophen (TYLENOL) 325 MG tablet Take 2 tablets (650 mg total) by mouth every 6 (six) hours as needed for mild pain (or Fever >/= 101). ?Patient taking differently: Take 650 mg by mouth every 8 (eight) hours as needed for mild pain (or Fever >/= 101). 03/30/18   Marcelyn Bruins, MD  ?albuterol (PROVENTIL HFA;VENTOLIN HFA) 108 (90 Base) MCG/ACT inhaler Inhale 2 puffs into the lungs 4 (four) times daily as needed for wheezing or shortness of breath.     [provider]  ?albuterol (PROVENTIL) (2.5 MG/3ML) 0.083% nebulizer solution Take 2.5 mg by nebulization every 6 (six) hours as needed for wheezing or shortness of breath.    [provider]  ?allopurinol (ZYLOPRIM) 100 MG tablet Take 100 mg by mouth at bedtime. For gout    [provider]  ?AMINO ACIDS-PROTEIN HYDROLYS PO Take 1 Dose by mouth 2 (two) times daily. Add SF Pro-Stat    [provider]  ?amLODipine (NORVASC) 10 MG tablet Take 1 tablet (10 mg total) by mouth daily. 08/09/12   Funches, Adriana Mccallum, MD  ?ascorbic acid (VITAMIN C) 1000 MG tablet Take 1 tablet (1,000 mg total) by mouth daily. 09/14/21    Gaylan Gerold, DO  ?aspirin 81 MG chewable tablet Chew 81 mg by mouth daily.    [provider]  ?calcium carbonate (TUMS EX) 750 MG chewable tablet Chew 1 tablet by mouth 2 (two) times daily as needed for heartburn.    [provider]  ?Cholecalciferol (VITAMIN D-3) 1000 units CAPS Take 1,000 Units by mouth daily.    [provider]  ?escitalopram (LEXAPRO) 10 MG tablet Take 10 mg by mouth daily.    [provider]  ?Evolocumab with Infusor (Green) 420 MG/3.5ML SOCT Inject 420 mg into the skin every 30 (thirty) days.    [provider]  ?ferrous sulfate 325 (65 FE) MG tablet Take 325 mg by mouth every other day.    [provider]  ?folic acid (FOLVITE) 1 MG tablet Take 1 tablet (1 mg total) by mouth daily. 09/14/21   Gaylan Gerold, DO  ?insulin aspart (NOVOLOG) 100 UNIT/ML injection Inject 0-9 Units into the skin 3 (three) times daily with meals. 10/07/21   Demaio, Roxine Caddy, MD  ?Melatonin 10 MG TABS Take 10 mg by mouth at bedtime.    [provider]  ?metFORMIN (GLUCOPHAGE) 850 MG tablet Take 850 mg by mouth 2 (two) times daily with a meal.    [provider]  ?Multiple Vitamins-Minerals (CERTAVITE SENIOR/ANTIOXIDANT) TABS Take 1 tablet by mouth daily.    [provider]  ?  nitroGLYCERIN (NITROSTAT) 0.4 MG SL tablet Place 0.4 mg under the tongue every 5 (five) minutes x 3 doses as needed for chest pain.  11/12/11   Funches, Adriana Mccallum, MD  ?oxyCODONE (OXY IR/ROXICODONE) 5 MG immediate release tablet Take 1-2 tablets (5-10 mg total) by mouth every 4 (four) hours as needed for moderate pain (pain score 4-6). 09/13/21   Timothy Lasso, MD  ?oxyCODONE 10 MG TABS Take 1-1.5 tablets (10-15 mg total) by mouth every 4 (four) hours as needed for severe pain (pain score 7-10). 09/13/21   Timothy Lasso, MD  ?pantoprazole (PROTONIX) 40 MG tablet Take 1 tablet (40 mg total) by mouth 2 (two) times daily. 10/07/21 12/06/21  Scarlett Presto, MD  ?pregabalin (LYRICA) 75 MG capsule Take 1 capsule (75 mg total) by mouth 2 (two) times daily. 10/24/21   Newt Minion, MD  ?pregabalin (LYRICA) 75 MG capsule Take 1 capsule (75 mg total) by mouth 2 (two) times daily. 10/24/21   Newt Minion, MD  ?senna-docusate (SENOKOT-S) 8.6-50 MG tablet Take 2 tablets by mouth daily.    [provider]  ?Skin Protectants, Misc. (MINERIN) CREA Apply 1 application topically 2 (two) times daily as needed (dryness).    [provider]  ?sucralfate (CARAFATE) 1 g tablet Take 1 tablet (1 g total) by mouth 4 (four) times daily -  with meals and at bedtime. 09/13/21   Gaylan Gerold, DO  ?tamsulosin (FLOMAX) 0.4 MG CAPS capsule Take 1 capsule by mouth at bedtime 10/31/21     ?traZODone (DESYREL) 50 MG tablet Take 50 mg by mouth at bedtime.    [provider]  ?vitamin B-12 (CYANOCOBALAMIN) 1000 MCG tablet Take 1,000 mcg by mouth daily.    [provider]  ?   ? ?Allergies    ?Atorvastatin, Statins, and Flexeril [cyclobenzaprine]   ? ?Review of Systems   ?Review of Systems ? ?Physical Exam ?Updated Vital Signs ?BP (!) 169/122   Pulse (!) 104   Temp 98.7 ?F (37.1 ?C) (Oral)   Resp 18   SpO2 99%  ?Physical Exam ?Vitals and nursing note reviewed.  ?Constitutional:   ?   General: He is not in acute distress. ?   Appearance: He is well-developed. He is not diaphoretic.  ?   Comments: Appears uncomfortable  ?HENT:  ?   Head: Normocephalic and atraumatic.  ?Eyes:  ?   General: No scleral icterus. ?   Conjunctiva/sclera: Conjunctivae normal.  ?Cardiovascular:  ?   Rate and Rhythm: Normal rate and regular rhythm.  ?   Heart sounds: Normal heart sounds.  ?Pulmonary:  ?   Effort: Pulmonary effort is normal. No respiratory distress.  ?   Breath sounds: Normal breath sounds.  ?Abdominal:  ?   Palpations: Abdomen is soft.  ?   Tenderness: There is no abdominal tenderness.  ?   Comments: Palpable, firm distention of the urinary bladder   ?Musculoskeletal:  ?   Cervical back: Normal range of motion and neck supple.  ?Skin: ?   General: Skin is warm and dry.  ?Neurological:  ?   Mental Status: He is alert.  ?Psychiatric:     ?   Behavior: Behavior normal.  ? ? ?ED Results / Procedures / Treatments   ?Labs ?(all labs ordered are listed, but only abnormal results are displayed) ?Labs Reviewed  ?URINALYSIS, ROUTINE W REFLEX MICROSCOPIC  ?CBC WITH DIFFERENTIAL/PLATELET  ?BASIC METABOLIC PANEL  ? ? ?EKG ?None ? ?Radiology ?No results found. ? ?Procedures ?  Procedures  ? ? ?Medications Ordered in ED ?Medications - No data to display ? ?ED Course/ Medical Decision Making/ A&P ?Clinical Course as of 12/26/21 0016  ?Wed Dec 25, 2021  ?7680 Basic metabolic panel(!) [AH]  ?2155 Creatinine(!): 1.64 [AH]  ?2155 CBC with Differential(!) [AH]  ?2155 Hemoglobin(!): 9.1 [AH]  ?  ?Clinical Course User Index ?[AH] Margarita Mail, PA-C  ? ?                        ?Medical Decision Making ?76 year old male here with acute urinary retention.  Dr. Tyrone Nine and Cyd Silence, RN here in the emergency department tried multiple times to pass a catheter but were unsuccessful.  I discussed the case with Dr. Alinda Money who came in and was able to insert a catheter.  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  He also states that the patient will need to be placed on Omnicef.  He was given some antibiotics here and appears appropriate for discharge at this time.  He asked that we add a urinary culture. ? ?Amount and/or Complexity of Data Reviewed ?Labs: ordered. Decision-making details documented in ED Course. ?   Details: Urine does not appear infected at this time. ? ?Risk ?Prescription drug management. ? ? ? ?Final Clinical Impression(s) / ED Diagnoses ?Final diagnoses:  ?None  ? ? ?Rx / DC Orders ?ED Discharge Orders   ? ? None  ? ?  ? ? ?  ?Margarita Mail, PA-C ?12/26/21 0024 ? ?  ?Deno Etienne,  DO ?12/26/21 1455 ? ?

## 2021-12-25 NOTE — ED Notes (Signed)
Pt moved to room 47 per providers request at this time ?

## 2021-12-25 NOTE — ED Notes (Signed)
Pt states he is unable to sign his medical screening form due to arthritis in his hands. RN is aware. ?

## 2021-12-25 NOTE — ED Triage Notes (Signed)
Pt with urinary rentention today. Last urinated last night. Reports his home health nurse placed a catheter today but did not get any urine return. Has just finished one week of abx for UTI.  ?

## 2021-12-25 NOTE — ED Provider Notes (Signed)
BLADDER CATHETERIZATION ? ?Date/Time: 12/25/2021 8:06 PM ?Performed by: Deno Etienne, DO ?Authorized by: Deno Etienne, DO  ? ?Consent:  ?  Consent obtained:  Verbal ?  Consent given by:  Patient ?  Risks discussed:  False passage, infection, incomplete procedure, pain and urethral injury ?  Alternatives discussed:  No treatment, delayed treatment and alternative treatment ?Universal protocol:  ?  Procedure explained and questions answered to patient or proxy's satisfaction: yes   ?  Immediately prior to procedure, a time out was called: yes   ?  Patient identity confirmed:  Verbally with patient ?Pre-procedure details:  ?  Procedure purpose:  Therapeutic ?  Preparation: Patient was prepped and draped in usual sterile fashion   ?Anesthesia:  ?  Anesthesia method:  None ?Procedure details:  ?  Provider performed due to:  Altered anatomy, complicated insertion and nurse unable to complete ?  Catheter insertion:  Indwelling ?  Catheter type:  Coude ?  Catheter size:  18 Fr ?  Number of attempts:  5 or more ?Post-procedure details:  ?  Procedure completion:  Procedure terminated electively by provider ?Comments:  ?   Attempted first an 42 [place] coud? catheter was only able to advance its about 5 or 6 cm and met resistance.  Tried rotating the catheter 90 degrees multiple times without improvement.  Then attempted a 32 French catheter also without success. ? ?  ?Deno Etienne, DO ?12/25/21 2007 ? ?

## 2021-12-25 NOTE — Discharge Instructions (Signed)
Contact a health care provider if: ?You have uncomfortable bladder contractions that you cannot control (spasms). ?You leak urine with the spasms. ?Get help right away if: ?You have chills or a fever. ?You have blood in your urine. ?You have a catheter and the following happens: ?Your catheter stops draining urine. ?Your catheter falls out. ?

## 2021-12-25 NOTE — Consult Note (Signed)
Urology Consult   Physician requesting consult: Margarita Mail, PA  Reason for consult: Urinary retention with difficult urethral catheterization  History of Present Illness: Leslie [name] is a 76 y.o. with a history of urinary retention followed by Dr. Dayshawn Pore in our office.  He underwent a recent voiding trial that was successful after starting alpha blocker therapy.  He has not voided since last night and is now uncomfortable.  Multiple attempts to place a Foley catheter have been unsuccessful in the ED tonight and urologic consultation was obtained.   Past Medical History:  Diagnosis Date   Anxiety    Panic attack - 10  years aago- ? panic    Asthma    Cellulitis of left foot 2016   Healed and recurred 01/2016   Cellulitis of right foot 06/30/2012   Chronic lower back pain 07/01/2012   COPD (chronic obstructive pulmonary disease) ([place])    Uses nebulizers at home. Former smoker, current chewing tobacco. 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   GERD (gastroesophageal reflux disease)    Gouty arthritis 07/01/2012   Not attacks in ~5 yrs (03/2016)   Hepatitis 1967   "in jail when I got it; dr said it was pretty bad; quaranteened X 30d"   Hyperlipidemia    Hypertension    Lung nodule, solitary 07/2015   Due for 1-yr recheck in 07/2017   Peripheral arterial disease ([place])    critical limb ischemia   Peripheral neuropathy 07/01/2012   Seasonal allergies    Skin growth 07/01/2012   right side of nares; "been on there 20 years"   Substance abuse ([place])    Type II diabetes mellitus ([place]) 2002   Diagnosed in 2002    Past Surgical History:  Procedure Laterality Date   AMPUTATION Left 03/28/2016   Procedure: AMPUTATION left foot 4th and 5 th ray;  Surgeon: Newt Minion, MD;  Location: [place];  Service: Orthopedics;  Laterality: Left;   AMPUTATION Left 04/11/2016   Procedure: LEFT BELOW KNEE AMPUTATION;  Surgeon: Newt Minion, MD;  Location: [place];  Service: Orthopedics;  Laterality: Left;   AMPUTATION Right  01/22/2018   Procedure: RIGHT FOOT 3RD RAY AMPUTATION;  Surgeon: Newt Minion, MD;  Location: [place];  Service: Orthopedics;  Laterality: Right;   AMPUTATION Right 02/19/2018   Procedure: RIGHT TRANSMETATARSAL AMPUTATION;  Surgeon: Newt Minion, MD;  Location: [place];  Service: Orthopedics;  Laterality: Right;   AMPUTATION Right 03/28/2018   Procedure: RIGHT BELOW KNEE AMPUTATION;  Surgeon: Newt Minion, MD;  Location: [place];  Service: Orthopedics;  Laterality: Right;   AMPUTATION Left 09/11/2021   Procedure: LEFT ABOVE KNEE AMPUTATION;  Surgeon: Newt Minion, MD;  Location: [place];  Service: Orthopedics;  Laterality: Left;   BIOPSY  08/25/2021   Procedure: BIOPSY;  Surgeon: Daryel November, MD;  Location: [facility] ENDOSCOPY;  Service: Gastroenterology;;   ESOPHAGOGASTRODUODENOSCOPY Left 09/05/2021   Procedure: ESOPHAGOGASTRODUODENOSCOPY (EGD);  Surgeon: Lavena Bullion, DO;  Location: [facility] ENDOSCOPY;  Service: Gastroenterology;  Laterality: Left;   ESOPHAGOGASTRODUODENOSCOPY (EGD) WITH PROPOFOL N/A 08/25/2021   Procedure: ESOPHAGOGASTRODUODENOSCOPY (EGD) WITH PROPOFOL;  Surgeon: Daryel November, MD;  Location: [place];  Service: Gastroenterology;  Laterality: N/A;   HEMOSTASIS CLIP PLACEMENT  09/05/2021   Procedure: HEMOSTASIS CLIP PLACEMENT;  Surgeon: Lavena Bullion, DO;  Location: [place];  Service: Gastroenterology;;   HOT HEMOSTASIS  08/25/2021   Procedure: HOT HEMOSTASIS (ARGON PLASMA COAGULATION/BICAP);  Surgeon: Daryel November, MD;  Location:  [place] ENDOSCOPY;  Service: Gastroenterology;;   HOT HEMOSTASIS N/A 09/05/2021   Procedure: HOT HEMOSTASIS (ARGON PLASMA COAGULATION/BICAP);  Surgeon: Lavena Bullion, DO;  Location: [facility] ENDOSCOPY;  Service: Gastroenterology;  Laterality: N/A;   I & D EXTREMITY Left 08/19/2021   Procedure: IRRIGATION AND DEBRIDEMENT LEFT KNEE;  Surgeon: Paralee Cancel, MD;  Location: [place];  Service: Orthopedics;  Laterality: Left;   I & D  EXTREMITY Left 08/29/2021   Procedure: IRRIGATION AND DEBRIDEMENT Left Knee;  Surgeon: Paralee Cancel, MD;  Location: [place];  Service: Orthopedics;  Laterality: Left;   LEG AMPUTATION BELOW KNEE Left 04/11/2016   LOWER EXTREMITY ANGIOGRAM N/A 04/18/2013   Procedure: LOWER EXTREMITY ANGIOGRAM;  Surgeon: Sherren Mocha, MD;  Location: [facility] CATH LAB;  Service: Cardiovascular;  Laterality: N/A;   LOWER EXTREMITY ANGIOGRAM  08/13/2015   bilateral iliac    PERIPHERAL VASCULAR CATHETERIZATION Bilateral 08/13/2015   Procedure: Lower Extremity Angiography;  Surgeon: Lorretta Harp, MD;  Location: [place] CV LAB;  Service: Cardiovascular;  Laterality: Bilateral;   PERIPHERAL VASCULAR CATHETERIZATION N/A 08/13/2015   Procedure: Abdominal Aortogram;  Surgeon: Lorretta Harp, MD;  Location: [place] CV LAB;  Service: Cardiovascular;  Laterality: N/A;   SCLEROTHERAPY  08/25/2021   Procedure: SCLEROTHERAPY;  Surgeon: Daryel November, MD;  Location: [facility] ENDOSCOPY;  Service: Gastroenterology;;   Clide Deutscher  09/05/2021   Procedure: Clide Deutscher;  Surgeon: Lavena Bullion, DO;  Location: [place] ENDOSCOPY;  Service: Gastroenterology;;   TOE AMPUTATION Right 01/22/2018   3RD TOE RIGHT FOOT    Medications:  Home meds:  No current facility-administered medications on file prior to encounter.   Current Outpatient Medications on File Prior to Encounter  Medication Sig Dispense Refill   acetaminophen (TYLENOL) 325 MG tablet Take 2 tablets (650 mg total) by mouth every 6 (six) hours as needed for mild pain (or Fever >/= 101). (Patient taking differently: Take 650 mg by mouth every 8 (eight) hours as needed for mild pain (or Fever >/= 101).) 30 tablet 0   albuterol (PROVENTIL HFA;VENTOLIN HFA) 108 (90 Base) MCG/ACT inhaler Inhale 2 puffs into the lungs 4 (four) times daily as needed for wheezing or shortness of breath.      albuterol (PROVENTIL) (2.5 MG/3ML) 0.083% nebulizer solution Take 2.5 mg by  nebulization every 6 (six) hours as needed for wheezing or shortness of breath.     allopurinol (ZYLOPRIM) 100 MG tablet Take 100 mg by mouth at bedtime. For gout     AMINO ACIDS-PROTEIN HYDROLYS PO Take 1 Dose by mouth 2 (two) times daily. Add SF Pro-Stat     amLODipine (NORVASC) 10 MG tablet Take 1 tablet (10 mg total) by mouth daily. 90 tablet 3   ascorbic acid (VITAMIN C) 1000 MG tablet Take 1 tablet (1,000 mg total) by mouth daily.     aspirin 81 MG chewable tablet Chew 81 mg by mouth daily.     calcium carbonate (TUMS EX) 750 MG chewable tablet Chew 1 tablet by mouth 2 (two) times daily as needed for heartburn.     Cholecalciferol (VITAMIN D-3) 1000 units CAPS Take 1,000 Units by mouth daily.     escitalopram (LEXAPRO) 10 MG tablet Take 10 mg by mouth daily.     Evolocumab with Infusor ([place]) 420 MG/3.5ML SOCT Inject 420 mg into the skin every 30 (thirty) days.     ferrous sulfate 325 (65 FE) MG tablet Take 325 mg by mouth every other day.  folic acid (FOLVITE) 1 MG tablet Take 1 tablet (1 mg total) by mouth daily.     insulin aspart (NOVOLOG) 100 UNIT/ML injection Inject 0-9 Units into the skin 3 (three) times daily with meals. 10 mL 11   Melatonin 10 MG TABS Take 10 mg by mouth at bedtime.     metFORMIN (GLUCOPHAGE) 850 MG tablet Take 850 mg by mouth 2 (two) times daily with a meal.     Multiple Vitamins-Minerals (CERTAVITE SENIOR/ANTIOXIDANT) TABS Take 1 tablet by mouth daily.     nitroGLYCERIN (NITROSTAT) 0.4 MG SL tablet Place 0.4 mg under the tongue every 5 (five) minutes x 3 doses as needed for chest pain.      oxyCODONE (OXY IR/ROXICODONE) 5 MG immediate release tablet Take 1-2 tablets (5-10 mg total) by mouth every 4 (four) hours as needed for moderate pain (pain score 4-6). 30 tablet 0   oxyCODONE 10 MG TABS Take 1-1.5 tablets (10-15 mg total) by mouth every 4 (four) hours as needed for severe pain (pain score 7-10). 30 tablet 0   pantoprazole (PROTONIX) 40  MG tablet Take 1 tablet (40 mg total) by mouth 2 (two) times daily. 60 tablet 1   pregabalin (LYRICA) 75 MG capsule Take 1 capsule (75 mg total) by mouth 2 (two) times daily. 60 capsule 0   pregabalin (LYRICA) 75 MG capsule Take 1 capsule (75 mg total) by mouth 2 (two) times daily. 60 capsule 0   senna-docusate (SENOKOT-S) 8.6-50 MG tablet Take 2 tablets by mouth daily.     Skin Protectants, Misc. (MINERIN) CREA Apply 1 application topically 2 (two) times daily as needed (dryness).     sucralfate (CARAFATE) 1 g tablet Take 1 tablet (1 g total) by mouth 4 (four) times daily -  with meals and at bedtime.     tamsulosin (FLOMAX) 0.4 MG CAPS capsule Take 1 capsule by mouth at bedtime 30 capsule 5   traZODone (DESYREL) 50 MG tablet Take 50 mg by mouth at bedtime.     vitamin B-12 (CYANOCOBALAMIN) 1000 MCG tablet Take 1,000 mcg by mouth daily.       Scheduled Meds: Continuous Infusions: PRN Meds:.  Allergies:  Allergies  Allergen Reactions   Atorvastatin Other (See Comments)    Unknown reaction   Statins Other (See Comments)    Unknown reaction - on MAR   Flexeril [Cyclobenzaprine] Other (See Comments)    Dry mouth and hallucinations    Family History  Problem Relation Age of Onset   Diabetes Sister    Hypertension Mother     Social History:  reports that he quit smoking about 19 years ago. His smoking use included cigarettes. He has a 80.00 pack-year smoking history. His smokeless tobacco use includes chew. He reports that he does not currently use alcohol. He reports that he does not use drugs.  ROS: A complete review of systems was performed.  All systems are negative except for pertinent findings as noted.  Physical Exam:  Vital signs in last 24 hours: Temp:  [98.7 F (37.1 C)] 98.7 F (37.1 C) (03/01 1801) Pulse Rate:  [104] 104 (03/01 1801) Resp:  [18] 18 (03/01 1801) BP: (169)/(122) 169/122 (03/01 1801) SpO2:  [99 %] 99 % (03/01 1801) Constitutional:  Alert and  oriented, No acute distress Cardiovascular: No JVD Respiratory: Normal respiratory effort GI: Mild distention of lower abdomen. Genitourinary: No CVAT. Normal male phallus with urethral erosion noted. Lymphatic: No lymphadenopathy Neurologic: Grossly intact, no focal deficits Psychiatric: Normal  mood and affect  Laboratory Data:  Recent Labs    12/25/21 1852  WBC 9.1  HGB 9.1*  HCT 29.3*  PLT 211    Recent Labs    12/25/21 1852  NA 135  K 5.0  CL 102  GLUCOSE 193*  BUN 18  CALCIUM 8.9  CREATININE 1.64*     Results for orders placed or performed during the hospital encounter of 12/25/21 (from the past 24 hour(s))  CBC with Differential     Status: Abnormal   Collection Time: 12/25/21  6:52 PM  Result Value Ref Range   WBC 9.1 4.0 - 10.5 K/uL   RBC 3.41 (L) 4.22 - 5.81 MIL/uL   Hemoglobin 9.1 (L) 13.0 - 17.0 g/dL   HCT 29.3 (L) 39.0 - 52.0 %   MCV 85.9 80.0 - 100.0 fL   MCH 26.7 26.0 - 34.0 pg   MCHC 31.1 30.0 - 36.0 g/dL   RDW 14.5 11.5 - 15.5 %   Platelets 211 150 - 400 K/uL   nRBC 0.0 0.0 - 0.2 %   Neutrophils Relative % 65 %   Neutro Abs 5.8 1.7 - 7.7 K/uL   Lymphocytes Relative 23 %   Lymphs Abs 2.1 0.7 - 4.0 K/uL   Monocytes Relative 10 %   Monocytes Absolute 0.9 0.1 - 1.0 K/uL   Eosinophils Relative 2 %   Eosinophils Absolute 0.2 0.0 - 0.5 K/uL   Basophils Relative 0 %   Basophils Absolute 0.0 0.0 - 0.1 K/uL   Immature Granulocytes 0 %   Abs Immature Granulocytes 0.02 0.00 - 0.07 K/uL  Basic metabolic panel     Status: Abnormal   Collection Time: 12/25/21  6:52 PM  Result Value Ref Range   Sodium 135 135 - 145 mmol/L   Potassium 5.0 3.5 - 5.1 mmol/L   Chloride 102 98 - 111 mmol/L   CO2 24 22 - 32 mmol/L   Glucose, Bld 193 (H) 70 - 99 mg/dL   BUN 18 8 - 23 mg/dL   Creatinine, Ser 1.64 (H) 0.61 - 1.24 mg/dL   Calcium 8.9 8.9 - 10.3 mg/dL   GFR, Estimated 43 (L) >60 mL/min   Anion gap 9 5 - 15   No results found for this or any previous visit  (from the past 240 hour(s)).  Renal Function: Recent Labs    12/25/21 1852  CREATININE 1.64*   CrCl cannot be calculated (Unknown ideal weight.).  Procedure: Under sterile conditions, I attempted to place an 65 [place] coud catheter but met resistance in the bulbar urethra.  I then attempted a 62 [place] coud catheter that also met resistance had a similar place within the urethra.  I then performed flexible cystoscopy with a 16 French flexible cystoscope.  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  I was able to place a 0.38 sensor guidewire past the stricture and into the bladder.  I then inserted a 71 [place] council tip catheter over the wire which passed into the bladder.  Approximately 1 L of grossly clear urine was returned.   Impression/Recommendation 1.  Urinary retention with complex urethral catheter placement: He will have an indwelling urethral catheter left in place and can be discharged.  He will receive broad-spectrum antibiotic therapy and a urine culture will be obtained.  I will notify Dr. Cain Sieve of the patient's situation so that he can have appropriate follow-up scheduled.  Dutch Gray 12/25/2021, 8:37 PM    Roxy Horseman,  Brooke Bonito MD  CC: Margarita Mail, PA-C

## 2021-12-25 NOTE — ED Provider Triage Note (Signed)
Emergency Medicine Provider Triage Evaluation Note ? ?Leslie [name] , a 76 y.o. male  was evaluated in triage.  Pt complains of inability to urinate since today.  Patient reports that he urinated last night, states it was a "good pee".  Patient states today he is unable to urinate.  Patient reports burning in his penis.  Patient denies any discharge.  Patient has history of urine retention requiring catheterization. ? ?Review of Systems  ?Positive: Urine retention, penile burning ?Negative: Fevers, nausea, vomiting, penile discharge, recent sex ? ?Physical Exam  ?BP (!) 169/122   Pulse (!) 104   Temp 98.7 ?F (37.1 ?C) (Oral)   Resp 18   SpO2 99%  ?Gen:   Awake, no distress   ?Resp:  Normal effort  ?MSK:   Moves extremities without difficulty  ?Other:  Abdomen is slightly distended, no tenderness.  No bladder distention is appreciated. ? ?Medical Decision Making  ?Medically screening exam initiated at 6:12 PM.  Appropriate orders placed.  Leslie [name] was informed that the remainder of the evaluation will be completed by another provider, this initial triage assessment does not replace that evaluation, and the importance of remaining in the ED until their evaluation is complete. ? ? ?  ?Azucena Cecil, PA-C ?12/25/21 1813 ? ?

## 2021-12-26 ENCOUNTER — Other Ambulatory Visit (HOSPITAL_COMMUNITY): Payer: Self-pay

## 2021-12-26 NOTE — ED Notes (Signed)
[facility] Called 

## 2021-12-27 LAB — URINE CULTURE
Culture: NO GROWTH
Special Requests: NORMAL

## 2022-01-27 ENCOUNTER — Other Ambulatory Visit (HOSPITAL_COMMUNITY): Payer: Self-pay

## 2022-01-27 MED ORDER — FINASTERIDE 5 MG PO TABS: ORAL_TABLET | ORAL | 3 refills | Status: AC

## 2022-03-10 ENCOUNTER — Emergency Department (HOSPITAL_COMMUNITY): Payer: Medicare (Managed Care)

## 2022-03-10 ENCOUNTER — Other Ambulatory Visit: Payer: Self-pay

## 2022-03-10 ENCOUNTER — Emergency Department (HOSPITAL_COMMUNITY)
Admission: EM | Admit: 2022-03-10 | Discharge: 2022-03-11 | Disposition: A | Discharge status: Home / Self Care | Payer: Medicare (Managed Care) | Attending: Emergency Medicine | Admitting: Emergency Medicine

## 2022-03-10 ENCOUNTER — Encounter (HOSPITAL_COMMUNITY): Payer: Self-pay

## 2022-03-10 DIAGNOSIS — H538 Other visual disturbances: Secondary | ICD-10-CM | POA: Insufficient documentation

## 2022-03-10 DIAGNOSIS — Z7982 Long term (current) use of aspirin: Secondary | ICD-10-CM | POA: Insufficient documentation

## 2022-03-10 DIAGNOSIS — N189 Chronic kidney disease, unspecified: Secondary | ICD-10-CM | POA: Insufficient documentation

## 2022-03-10 DIAGNOSIS — E1165 Type 2 diabetes mellitus with hyperglycemia: Secondary | ICD-10-CM | POA: Insufficient documentation

## 2022-03-10 DIAGNOSIS — I129 Hypertensive chronic kidney disease with stage 1 through stage 4 chronic kidney disease, or unspecified chronic kidney disease: Secondary | ICD-10-CM | POA: Insufficient documentation

## 2022-03-10 DIAGNOSIS — Z7984 Long term (current) use of oral hypoglycemic drugs: Secondary | ICD-10-CM | POA: Insufficient documentation

## 2022-03-10 DIAGNOSIS — E1122 Type 2 diabetes mellitus with diabetic chronic kidney disease: Secondary | ICD-10-CM | POA: Diagnosis not present

## 2022-03-10 DIAGNOSIS — Z79899 Other long term (current) drug therapy: Secondary | ICD-10-CM | POA: Insufficient documentation

## 2022-03-10 DIAGNOSIS — Z794 Long term (current) use of insulin: Secondary | ICD-10-CM | POA: Insufficient documentation

## 2022-03-10 DIAGNOSIS — J449 Chronic obstructive pulmonary disease, unspecified: Secondary | ICD-10-CM | POA: Diagnosis not present

## 2022-03-10 DIAGNOSIS — H547 Unspecified visual loss: Secondary | ICD-10-CM | POA: Insufficient documentation

## 2022-03-10 DIAGNOSIS — H5462 Unqualified visual loss, left eye, normal vision right eye: Secondary | ICD-10-CM

## 2022-03-10 LAB — COMPREHENSIVE METABOLIC PANEL
ALT: 15 U/L (ref 0–44)
AST: 18 U/L (ref 15–41)
Albumin: 3.6 g/dL (ref 3.5–5.0)
Alkaline Phosphatase: 60 U/L (ref 38–126)
Anion gap: 8 (ref 5–15)
BUN: 19 mg/dL (ref 8–23)
CO2: 26 mmol/L (ref 22–32)
Calcium: 8.9 mg/dL (ref 8.9–10.3)
Chloride: 102 mmol/L (ref 98–111)
Creatinine, Ser: 1.6 mg/dL — ABNORMAL HIGH (ref 0.61–1.24)
GFR, Estimated: 44 mL/min — ABNORMAL LOW (ref >60)
Glucose, Bld: 157 mg/dL — ABNORMAL HIGH (ref 70–99)
Potassium: 4 mmol/L (ref 3.5–5.1)
Sodium: 136 mmol/L (ref 135–145)
Total Bilirubin: 0.6 mg/dL (ref 0.3–1.2)
Total Protein: 7.4 g/dL (ref 6.5–8.1)

## 2022-03-10 LAB — CBC WITH DIFFERENTIAL/PLATELET
Abs Immature Granulocytes: 0.01 10*3/uL (ref 0.00–0.07)
Basophils Absolute: 0 10*3/uL (ref 0.0–0.1)
Basophils Relative: 1 %
Eosinophils Absolute: 0.1 10*3/uL (ref 0.0–0.5)
Eosinophils Relative: 2 %
HCT: 30.9 % — ABNORMAL LOW (ref 39.0–52.0)
Hemoglobin: 9.8 g/dL — ABNORMAL LOW (ref 13.0–17.0)
Immature Granulocytes: 0 %
Lymphocytes Relative: 36 %
Lymphs Abs: 2.4 10*3/uL (ref 0.7–4.0)
MCH: 26.7 pg (ref 26.0–34.0)
MCHC: 31.7 g/dL (ref 30.0–36.0)
MCV: 84.2 fL (ref 80.0–100.0)
Monocytes Absolute: 0.6 10*3/uL (ref 0.1–1.0)
Monocytes Relative: 10 %
Neutro Abs: 3.3 10*3/uL (ref 1.7–7.7)
Neutrophils Relative %: 51 %
Platelets: 159 10*3/uL (ref 150–400)
RBC: 3.67 MIL/uL — ABNORMAL LOW (ref 4.22–5.81)
RDW: 15.6 % — ABNORMAL HIGH (ref 11.5–15.5)
WBC: 6.5 10*3/uL (ref 4.0–10.5)
nRBC: 0 % (ref 0.0–0.2)

## 2022-03-10 LAB — CBG MONITORING, ED: Glucose-Capillary: 177 mg/dL — ABNORMAL HIGH (ref 70–99)

## 2022-03-10 NOTE — ED Triage Notes (Signed)
Pt arrived POV from home c/o left vision changes. Pt states he went to sleep and woke up this morning and now can barely see out of his left eye. Pt states it seems blurry but he has spots where he can see clearly. Pt denies any pain, and denies any trauma or injury.  ?

## 2022-03-10 NOTE — ED Provider Notes (Signed)
?[place] ?Provider Note ? ? ?CSN: [id number] ?Arrival date & time: 03/10/22  1528 ? ?  ? ?History ? ?Chief Complaint  ?Patient presents with  ? Visual Field Change  ? ? ?Leslie [name] is a 76 y.o. male. ? ?HPI ?Patient presents for left eye vision changes.  He states that his vision seemed normal last night when he went to sleep.  This morning when he woke up, he had painless blurry vision from his left eye.  Blurriness is severe but he does have a small window of clear vision on the lateral aspect of his visual field.  His change in vision has not evolved throughout the day.  He denies any other associated symptoms including difficulty with speech, areas of numbness, or areas of weakness.  He denies any recent trauma. ?  ? ?Home Medications ?Prior to Admission medications   ?Medication Sig Start Date End Date Taking? Authorizing Provider  ?acetaminophen (TYLENOL) 325 MG tablet Take 2 tablets (650 mg total) by mouth every 6 (six) hours as needed for mild pain (or Fever >/= 101). ?Patient taking differently: Take 650 mg by mouth every 8 (eight) hours as needed for mild pain (or Fever >/= 101). 03/30/18   Marcelyn Bruins, MD  ?albuterol (PROVENTIL HFA;VENTOLIN HFA) 108 (90 Base) MCG/ACT inhaler Inhale 2 puffs into the lungs 4 (four) times daily as needed for wheezing or shortness of breath.     [provider]  ?albuterol (PROVENTIL) (2.5 MG/3ML) 0.083% nebulizer solution Take 2.5 mg by nebulization every 6 (six) hours as needed for wheezing or shortness of breath.    [provider]  ?allopurinol (ZYLOPRIM) 100 MG tablet Take 100 mg by mouth at bedtime. For gout    [provider]  ?AMINO ACIDS-PROTEIN HYDROLYS PO Take 1 Dose by mouth 2 (two) times daily. Add SF Pro-Stat    [provider]  ?amLODipine (NORVASC) 10 MG tablet Take 1 tablet (10 mg total) by mouth daily. 08/09/12   Funches, Adriana Mccallum, MD  ?ascorbic acid (VITAMIN C) 1000 MG  tablet Take 1 tablet (1,000 mg total) by mouth daily. 09/14/21   Gaylan Gerold, DO  ?aspirin 81 MG chewable tablet Chew 81 mg by mouth daily.    [provider]  ?calcium carbonate (TUMS EX) 750 MG chewable tablet Chew 1 tablet by mouth 2 (two) times daily as needed for heartburn.    [provider]  ?cefdinir (OMNICEF) 300 MG capsule Take 1 capsule (300 mg total) by mouth 2 (two) times daily. 12/25/21   Margarita Mail, PA-C  ?Cholecalciferol (VITAMIN D-3) 1000 units CAPS Take 1,000 Units by mouth daily.    [provider]  ?escitalopram (LEXAPRO) 10 MG tablet Take 10 mg by mouth daily.    [provider]  ?Evolocumab with Infusor ([place]) 420 MG/3.5ML SOCT Inject 420 mg into the skin every 30 (thirty) days.    [provider]  ?ferrous sulfate 325 (65 FE) MG tablet Take 325 mg by mouth every other day.    [provider]  ?finasteride (PROSCAR) 5 MG tablet Take 1 tablet by mouth daily. 01/27/22     ?folic acid (FOLVITE) 1 MG tablet Take 1 tablet (1 mg total) by mouth daily. 09/14/21   Gaylan Gerold, DO  ?insulin aspart (NOVOLOG) 100 UNIT/ML injection Inject 0-9 Units into the skin 3 (three) times daily with meals. 10/07/21   Demaio, Roxine Caddy, MD  ?Melatonin 10 MG TABS Take 10 mg  by mouth at bedtime.    [provider]  ?metFORMIN (GLUCOPHAGE) 850 MG tablet Take 850 mg by mouth 2 (two) times daily with a meal.    [provider]  ?Multiple Vitamins-Minerals (CERTAVITE SENIOR/ANTIOXIDANT) TABS Take 1 tablet by mouth daily.    [provider]  ?nitroGLYCERIN (NITROSTAT) 0.4 MG SL tablet Place 0.4 mg under the tongue every 5 (five) minutes x 3 doses as needed for chest pain.  11/12/11   Funches, Adriana Mccallum, MD  ?oxyCODONE (OXY IR/ROXICODONE) 5 MG immediate release tablet Take 1-2 tablets (5-10 mg total) by mouth every 4 (four) hours as needed for moderate pain (pain score 4-6). 09/13/21   Timothy Lasso, MD  ?oxyCODONE 10 MG TABS  Take 1-1.5 tablets (10-15 mg total) by mouth every 4 (four) hours as needed for severe pain (pain score 7-10). 09/13/21   Timothy Lasso, MD  ?pantoprazole (PROTONIX) 40 MG tablet Take 1 tablet (40 mg total) by mouth 2 (two) times daily. 10/07/21 12/06/21  Scarlett Presto, MD  ?pregabalin (LYRICA) 75 MG capsule Take 1 capsule (75 mg total) by mouth 2 (two) times daily. 10/24/21   Newt Minion, MD  ?pregabalin (LYRICA) 75 MG capsule Take 1 capsule (75 mg total) by mouth 2 (two) times daily. 10/24/21   Newt Minion, MD  ?senna-docusate (SENOKOT-S) 8.6-50 MG tablet Take 2 tablets by mouth daily.    [provider]  ?Skin Protectants, Misc. (MINERIN) CREA Apply 1 application topically 2 (two) times daily as needed (dryness).    [provider]  ?sucralfate (CARAFATE) 1 g tablet Take 1 tablet (1 g total) by mouth 4 (four) times daily -  with meals and at bedtime. 09/13/21   Gaylan Gerold, DO  ?tamsulosin (FLOMAX) 0.4 MG CAPS capsule Take 1 capsule by mouth at bedtime 10/31/21     ?traZODone (DESYREL) 50 MG tablet Take 50 mg by mouth at bedtime.    [provider]  ?vitamin B-12 (CYANOCOBALAMIN) 1000 MCG tablet Take 1,000 mcg by mouth daily.    [provider]  ?   ? ?Allergies    ?Atorvastatin, Statins, and Flexeril [cyclobenzaprine]   ? ?Review of Systems   ?Review of Systems  ?Eyes:  Positive for visual disturbance.  ?All other systems reviewed and are negative. ? ?Physical Exam ?Updated Vital Signs ?BP (!) 157/74   Pulse 73   Temp 98.8 ?F (37.1 ?C) (Oral)   Resp 18   Ht '5\' 6"'$  (1.676 m)   Wt 80.3 kg   SpO2 100%   BMI 28.57 kg/m?  ?Physical Exam ?Vitals and nursing note reviewed.  ?Constitutional:   ?   General: He is not in acute distress. ?   Appearance: Normal appearance. He is well-developed. He is not toxic-appearing or diaphoretic.  ?HENT:  ?   Head: Normocephalic and atraumatic.  ?   Right Ear: External ear normal.  ?   Left Ear: External ear normal.  ?   Nose: Nose  normal.  ?   Mouth/Throat:  ?   Mouth: Mucous membranes are moist.  ?   Pharynx: Oropharynx is clear.  ?Eyes:  ?   General: Visual field deficit present.  ?   Extraocular Movements: Extraocular movements intact.  ?   Conjunctiva/sclera: Conjunctivae normal.  ?   Comments: Nasal hemianopia out of left eye.  Able to see light and movement only.  No evidence of retinal detachment or vitreous hemorrhage on bedside ultrasound.  IOP 11.  ?Cardiovascular:  ?  Rate and Rhythm: Normal rate and regular rhythm.  ?Pulmonary:  ?   Effort: Pulmonary effort is normal. No respiratory distress.  ?Abdominal:  ?   General: There is no distension.  ?   Palpations: Abdomen is soft.  ?Musculoskeletal:     ?   General: No swelling.  ?   Cervical back: Normal range of motion and neck supple.  ?   Comments: Right BKA and left AKA  ?Skin: ?   General: Skin is warm and dry.  ?   Capillary Refill: Capillary refill takes less than 2 seconds.  ?   Coloration: Skin is not jaundiced or pale.  ?Neurological:  ?   Mental Status: He is alert and oriented to person, place, and time.  ?   Cranial Nerves: No cranial nerve deficit, dysarthria or facial asymmetry.  ?   Sensory: Sensation is intact. No sensory deficit.  ?   Motor: Motor function is intact. No weakness or pronator drift.  ?   Coordination: Coordination is intact. Finger-Nose-Finger Test normal.  ?Psychiatric:     ?   Mood and Affect: Mood normal.  ? ? ?ED Results / Procedures / Treatments   ?Labs ?(all labs ordered are listed, but only abnormal results are displayed) ?Labs Reviewed  ?COMPREHENSIVE METABOLIC PANEL - Abnormal; Notable for the following components:  ?    Result Value  ? Glucose, Bld 157 (*)   ? Creatinine, Ser 1.60 (*)   ? GFR, Estimated 44 (*)   ? All other components within normal limits  ?CBC WITH DIFFERENTIAL/PLATELET - Abnormal; Notable for the following components:  ? RBC 3.67 (*)   ? Hemoglobin 9.8 (*)   ? HCT 30.9 (*)   ? RDW 15.6 (*)   ? All other components within  normal limits  ?CBG MONITORING, ED - Abnormal; Notable for the following components:  ? Glucose-Capillary 177 (*)   ? All other components within normal limits  ? ? ?EKG ?None ? ?Radiology ?CT HEAD WO CONTRAST (5MM

## 2022-03-10 NOTE — ED Provider Triage Note (Signed)
Emergency Medicine Provider Triage Evaluation Note ? ?[name] , a 76 y.o. male  was evaluated in triage.  Pt complains of blurry vision in left visual field.  States that part of his left visual field appears blurry but denies any complete loss of vision.  This began when he woke up this morning.  Went to sleep in his usual state of health without any vision changes.  Denies any headache, injuries, numbness, weakness or history of similar symptoms in the past. ? ?Review of Systems  ?Positive: Vision changes ?Negative: Numbness ? ?Physical Exam  ?BP (!) 147/74 (BP Location: Left Arm)   Pulse 78   Temp 98.8 ?F (37.1 ?C) (Oral)   Resp 18   Ht '5\' 6"'$  (1.676 m)   Wt 80.3 kg   SpO2 97%   BMI 28.57 kg/m?  ?Gen:   Awake, no distress   ?Resp:  Normal effort  ?MSK:   Moves extremities without difficulty  ?Other:  Pupils equal bilaterally ? ?Medical Decision Making  ?Medically screening exam initiated at 4:37 PM.  Appropriate orders placed.  [name] was informed that the remainder of the evaluation will be completed by another provider, this initial triage assessment does not replace that evaluation, and the importance of remaining in the ED until their evaluation is complete. ? ?Work-up initiated ?  ?Delia Heady, PA-C ?03/10/22 1638 ? ?

## 2022-03-11 MED ORDER — TETRACAINE HCL 0.5 % OP SOLN
1.0000 [drp] | Freq: Once | OPHTHALMIC | Status: AC
  Administered 2022-03-11: 1 [drp] via OPHTHALMIC
  Filled 2022-03-11: qty 4

## 2022-03-11 NOTE — Discharge Instructions (Signed)
You were seen today for vision changes.  Your MRI is reassuring.  Follow-up with ophthalmology as scheduled ?

## 2022-03-11 NOTE — ED Notes (Addendum)
This RN called [facility] and left voicemail to pick up patient as he is up for discharge; left ED call back number [phone]. ?

## 2022-04-01 ENCOUNTER — Ambulatory Visit (INDEPENDENT_AMBULATORY_CARE_PROVIDER_SITE_OTHER): Payer: Medicare (Managed Care)

## 2022-04-01 ENCOUNTER — Ambulatory Visit (INDEPENDENT_AMBULATORY_CARE_PROVIDER_SITE_OTHER): Payer: Medicare (Managed Care) | Admitting: Cardiovascular Disease

## 2022-04-01 ENCOUNTER — Encounter: Payer: Self-pay | Admitting: Cardiovascular Disease

## 2022-04-01 ENCOUNTER — Other Ambulatory Visit: Payer: Self-pay | Admitting: Cardiovascular Disease

## 2022-04-01 VITALS — BP 142/70 | HR 86 | Ht 64.0 in | Wt 181.0 lb

## 2022-04-01 DIAGNOSIS — H539 Unspecified visual disturbance: Secondary | ICD-10-CM

## 2022-04-01 DIAGNOSIS — R0989 Other specified symptoms and signs involving the circulatory and respiratory systems: Secondary | ICD-10-CM

## 2022-04-01 DIAGNOSIS — E785 Hyperlipidemia, unspecified: Secondary | ICD-10-CM

## 2022-04-01 DIAGNOSIS — I4891 Unspecified atrial fibrillation: Secondary | ICD-10-CM

## 2022-04-01 DIAGNOSIS — I739 Peripheral vascular disease, unspecified: Secondary | ICD-10-CM

## 2022-04-01 DIAGNOSIS — I10 Essential (primary) hypertension: Secondary | ICD-10-CM

## 2022-04-01 NOTE — Assessment & Plan Note (Signed)
History of peripheral arterial disease status post peripheral angiography by myself 07/28/2015 revealing bilateral long segmental 90% calcified SFA stenoses with three-vessel runoff on the right and 1 on the left.  He ultimately underwent right BKA by Dr. Sharol Given.  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  He is wheelchair-bound.

## 2022-04-01 NOTE — Progress Notes (Signed)
04/01/2022 Leslie [name]   1945/11/20  [id number]  [place], [place] Primary Cardiologist: Lorretta Harp MD FACP, [place], [place], [place]  HPI:  Leslie [name] is a 76 y.o.  moderately overweight widowed Caucasian male with no children who lives alone in an independent living environment and was referred by Dr. Jake Bathe for peripheral vascular evaluation. 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 He has 50-80 pack years history of tobacco use having quit 13 years ago. He also has treated diabetes, hypertension and hyperlipidemia. He has peripheral neuropathy. He underwent lower lower extremity angiography by Dr. Burt Knack 04/18/13 revealing noncritical PAD with predominantly tibial vessel disease. On the left the posterior tibial was occluded which reconstituted above the ankle and the anterior tibial and pedal arteries were patent with diffuse disease. On the right the anterior tibial had a 75% ostial stenosis with three-vessel disease. He denies claudication. He has a slowly healing wound on the lateral aspect of his right fifth metatarsal which is being addressed at the [facility]. He had recent ABIs performed on 03/27/15 which revealed a right ABI of 0.8 and left 0.78 although these were probably artificially elevated because of calcified noncompressible vessels. I'm going to repeat lower extremity arterial Doppler studies. Since I saw him 2 months ago he developed recurrent wound on his left foot which is currently wrapped. Follow-up Dopplers performed in the office 05/11/15 revealed a right ABI 0.81 with high-frequency signal in his proximal right SFA and popliteal artery, left ABI 0.88 with a hypertensive signal in his mid left SFA. Since I saw him in the office 2 years ago he has undergone left BKA by Dr. Sharol Given  04/11/16 for what appears to be a nonhealing ulcer/critical  limb ischemia. He wears a prosthesis and walks with the aid of a walker. Recent lower extremity arterial Doppler studies performed 04/27/17 revealed a right ABI of 0.85 with an occluded proximal right SFA.   Since I saw him in the office 4 years ago he did ultimately require a right BKA by Dr. Sharol Given.  Late last year he required a left BKA which was ultimately converted to an AKA.  He is wheelchair-bound.  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  He denies chest pain or shortness of breath.   Current Meds  Medication Sig   acetaminophen (TYLENOL) 325 MG tablet Take 2 tablets (650 mg total) by mouth every 6 (six) hours as needed for mild pain (or Fever >/= 101). (Patient taking differently: Take 650 mg by mouth every 8 (eight) hours as needed for mild pain (or Fever >/= 101).)   albuterol (PROVENTIL HFA;VENTOLIN HFA) 108 (90 Base) MCG/ACT inhaler Inhale 2 puffs into the lungs 4 (four) times daily as needed for wheezing or shortness of breath.    albuterol (PROVENTIL) (2.5 MG/3ML) 0.083% nebulizer solution Take 2.5 mg by nebulization every 6 (six) hours as needed for wheezing or shortness of breath.   allopurinol (ZYLOPRIM) 100 MG tablet Take 100 mg by mouth at bedtime. For gout   AMINO ACIDS-PROTEIN HYDROLYS PO Take 1 Dose by mouth 2 (two) times daily. Add SF Pro-Stat   amLODipine (NORVASC) 10 MG tablet Take 1 tablet (10 mg total) by mouth daily.   ascorbic acid (VITAMIN C) 1000 MG tablet  Take 1 tablet (1,000 mg total) by mouth daily.   aspirin 81 MG chewable tablet Chew 81 mg by mouth daily.   calcium carbonate (TUMS EX) 750 MG chewable tablet Chew 1 tablet by mouth 2 (two) times daily as needed for heartburn.   cefdinir (OMNICEF) 300 MG capsule Take 1 capsule (300 mg total) by mouth 2 (two) times daily.   Cholecalciferol (VITAMIN D-3) 1000 units CAPS Take 1,000 Units by mouth daily.   escitalopram (LEXAPRO) 10 MG tablet Take 10  mg by mouth daily.   Evolocumab with Infusor ([place]) 420 MG/3.5ML SOCT Inject 420 mg into the skin every 30 (thirty) days.   ferrous sulfate 325 (65 FE) MG tablet Take 325 mg by mouth every other day.   finasteride (PROSCAR) 5 MG tablet Take 1 tablet by mouth daily.   folic acid (FOLVITE) 1 MG tablet Take 1 tablet (1 mg total) by mouth daily.   insulin aspart (NOVOLOG) 100 UNIT/ML injection Inject 0-9 Units into the skin 3 (three) times daily with meals.   Melatonin 10 MG TABS Take 10 mg by mouth at bedtime.   metFORMIN (GLUCOPHAGE) 850 MG tablet Take 850 mg by mouth 2 (two) times daily with a meal.   Multiple Vitamins-Minerals (CERTAVITE SENIOR/ANTIOXIDANT) TABS Take 1 tablet by mouth daily.   nitroGLYCERIN (NITROSTAT) 0.4 MG SL tablet Place 0.4 mg under the tongue every 5 (five) minutes x 3 doses as needed for chest pain.    oxyCODONE (OXY IR/ROXICODONE) 5 MG immediate release tablet Take 1-2 tablets (5-10 mg total) by mouth every 4 (four) hours as needed for moderate pain (pain score 4-6).   oxyCODONE 10 MG TABS Take 1-1.5 tablets (10-15 mg total) by mouth every 4 (four) hours as needed for severe pain (pain score 7-10).   pregabalin (LYRICA) 75 MG capsule Take 1 capsule (75 mg total) by mouth 2 (two) times daily.   pregabalin (LYRICA) 75 MG capsule Take 1 capsule (75 mg total) by mouth 2 (two) times daily.   senna-docusate (SENOKOT-S) 8.6-50 MG tablet Take 2 tablets by mouth daily.   Skin Protectants, Misc. (MINERIN) CREA Apply 1 application topically 2 (two) times daily as needed (dryness).   sucralfate (CARAFATE) 1 g tablet Take 1 tablet (1 g total) by mouth 4 (four) times daily -  with meals and at bedtime.   tamsulosin (FLOMAX) 0.4 MG CAPS capsule Take 1 capsule by mouth at bedtime   traZODone (DESYREL) 50 MG tablet Take 50 mg by mouth at bedtime.   vitamin B-12 (CYANOCOBALAMIN) 1000 MCG tablet Take 1,000 mcg by mouth daily.     Allergies  Allergen Reactions    Atorvastatin Other (See Comments)    Unknown reaction   Statins Other (See Comments)    Unknown reaction - on MAR   Flexeril [Cyclobenzaprine] Other (See Comments)    Dry mouth and hallucinations    Social History   Socioeconomic History   Marital status: Widowed    Spouse name: Not on file   Number of children: Not on file   Years of education: Not on file   Highest education level: Not on file  Occupational History   Occupation: disabled  Tobacco Use   Smoking status: Former    Packs/day: 2.00    Years: 40.00    Pack years: 80.00    Types: Cigarettes    Quit date: 01/13/2002    Years since quitting: 20.2   Smokeless tobacco: Current    Types: Loss adjuster, chartered  Tobacco comments:    chews a little tobacco  Vaping Use   Vaping Use: Never used  Substance and Sexual Activity   Alcohol use: Not Currently    Comment: 03/03/18: "Used to drink alot. 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   Lives in assisted living. Performs ADLs and IADLs, including cooking and managing his medications. Friend drives him to the store. Uses rollator walker at home and motorized scooter in store.   [facility] patient   Widowed   5-8 years education   Likes to fish   Pets:  4 cockatiels and 2 lovebirds   Social Determinants of Radio broadcast assistant Strain: Not on file  Food Insecurity: Not on file  Transportation Needs: Not on file  Physical Activity: Not on file  Stress: Not on file  Social Connections: Not on file  Intimate Partner Violence: Not on file     Review of Systems: General: negative for chills, fever, night sweats or weight changes.  Cardiovascular: negative for chest pain, dyspnea on exertion, edema, orthopnea, palpitations,  paroxysmal nocturnal dyspnea or shortness of breath Dermatological: negative for rash Respiratory: negative for cough or wheezing Urologic: negative for hematuria Abdominal: negative for nausea, vomiting, diarrhea, bright red blood per rectum, melena, or hematemesis Neurologic: negative for visual changes, syncope, or dizziness All other systems reviewed and are otherwise negative except as noted above.    Blood pressure (!) 142/70, pulse 86, height '5\' 4"'$  (1.626 m), weight 181 lb (82.1 kg), SpO2 98 %.  General appearance: alert and no distress Neck: no adenopathy, no JVD, supple, symmetrical, trachea midline, thyroid not enlarged, symmetric, no tenderness/mass/nodules, and soft right carotid bruit Lungs: clear to auscultation bilaterally Heart: regular rate and rhythm, S1, S2 normal, no murmur, click, rub or gallop Extremities: extremities normal, atraumatic, no cyanosis or edema Pulses: Bilateral BKA/AKA Skin: Skin color, texture, turgor normal. No rashes or lesions Neurologic: Grossly normal  EKG sinus rhythm 86 without ST or T wave changes.  I personally reviewed this EKG.  ASSESSMENT AND PLAN:   HLD (hyperlipidemia) History of hyperlipidemia on Repatha with lipid profile performed 08/15/2021 revealing total cholesterol 133.  HTN (hypertension) History of essential hypertension a blood pressure measured today at 142/70.  He is on amlodipine.  Peripheral arterial disease ([place]) History of peripheral arterial disease status post peripheral angiography by myself 07/28/2015 revealing bilateral long segmental 90% calcified SFA stenoses with three-vessel runoff on the right and 1 on the left.  He ultimately underwent right BKA by Dr. Sharol Given.  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  He is wheelchair-bound.     Lorretta Harp MD FACP,FACC,FAHA, [facility] 04/01/2022 10:54 AM

## 2022-04-01 NOTE — Patient Instructions (Signed)
Medication Instructions:  Your physician recommends that you continue on your current medications as directed. Please refer to the Current Medication list given to you today.  *If you need a refill on your cardiac medications before your next appointment, please call your pharmacy*   Testing/Procedures: Your physician has requested that you have a carotid duplex. This test is an ultrasound of the carotid arteries in your neck. It looks at blood flow through these arteries that supply the brain with blood. Allow one hour for this exam. There are no restrictions or special instructions. This procedure will be done at [place]. [address]    ZIO XT- Long Term Monitor Instructions  Your physician has requested you wear a ZIO patch monitor for 14 days.  This is a single patch monitor. [employer] supplies one patch monitor per enrollment. Additional stickers are not available. Please do not apply patch if you will be having a Nuclear Stress Test,  Echocardiogram, Cardiac CT, MRI, or Chest Xray during the period you would be wearing the  monitor. The patch cannot be worn during these tests. You cannot remove and re-apply the  ZIO XT patch monitor.  Your ZIO patch monitor will be mailed 3 day USPS to your address on file. It may take 3-5 days  to receive your monitor after you have been enrolled.  Once you have received your monitor, please review the enclosed instructions. Your monitor  has already been registered assigning a specific monitor serial # to you.  Billing and Patient Assistance Program Information  We have supplied [employer] with any of your insurance information on file for billing purposes. [employer] offers a sliding scale Patient Assistance Program for patients that do not have  insurance, or whose insurance does not completely cover the cost of the ZIO monitor.  You must apply for the Patient Assistance Program to qualify for this discounted rate.  To apply, please call  [employer] at [phone], select option 4, select option 2, ask to apply for  Patient Assistance Program. Theodore Demark will ask your household income, and how many people  are in your household. They will quote your out-of-pocket cost based on that information.  [employer] will also be able to set up a 49-month interest-free payment plan if needed.  Applying the monitor   Shave hair from upper left chest.  Hold abrader disc by orange tab. Rub abrader in 40 strokes over the upper left chest as  indicated in your monitor instructions.  Clean area with 4 enclosed alcohol pads. Let dry.  Apply patch as indicated in monitor instructions. Patch will be placed under collarbone on left  side of chest with arrow pointing upward.  Rub patch adhesive wings for 2 minutes. Remove white label marked "1". Remove the white  label marked "2". Rub patch adhesive wings for 2 additional minutes.  While looking in a mirror, press and release button in center of patch. A small green light will  flash 3-4 times. This will be your only indicator that the monitor has been turned on.  Do not shower for the first 24 hours. You may shower after the first 24 hours.  Press the button if you feel a symptom. You will hear a small click. Record Date, Time and  Symptom in the Patient Logbook.  When you are ready to remove the patch, follow instructions on the last 2 pages of Patient  Logbook. Stick patch monitor onto the last page of Patient Logbook.  Place Patient Logbook in  the blue and white box. Use locking tab on box and tape box closed  securely. The blue and white box has prepaid postage on it. Please place it in the mailbox as  soon as possible. Your physician should have your test results approximately 7 days after the  monitor has been mailed back to [facility].  Call [facility] at [phone] if you have questions regarding  your ZIO XT patch monitor. Call them immediately if you see an orange  light blinking on your  monitor.  If your monitor falls off in less than 4 days, contact our Monitor department at [phone].  If your monitor becomes loose or falls off after 4 days call [employer] at [phone] for  suggestions on securing your monitor    Follow-Up: At [facility], you and your health needs are our priority.  As part of our continuing mission to provide you with exceptional heart care, we have created designated Provider Care Teams.  These Care Teams include your primary Cardiologist (physician) and Advanced Practice Providers (APPs -  Physician Assistants and Nurse Practitioners) who all work together to provide you with the care you need, when you need it.  We recommend signing up for the patient portal called "MyChart".  Sign up information is provided on this After Visit Summary.  MyChart is used to connect with patients for Virtual Visits (Telemedicine).  Patients are able to view lab/test results, encounter notes, upcoming appointments, etc.  Non-urgent messages can be sent to your provider as well.   To learn more about what you can do with MyChart, go to [email or web address].    Your next appointment:   12 month(s)  The format for your next appointment:   In Person  Provider:   Quay Burow, MD

## 2022-04-01 NOTE — Assessment & Plan Note (Signed)
History of hyperlipidemia on Repatha with lipid profile performed 08/15/2021 revealing total cholesterol 133.

## 2022-04-01 NOTE — Assessment & Plan Note (Signed)
History of essential hypertension a blood pressure measured today at 142/70.  He is on amlodipine.

## 2022-04-01 NOTE — Progress Notes (Unsigned)
Enrolled for [employer] to mail a ZIO XT long term holter monitor to the patients address on file.  

## 2022-04-07 ENCOUNTER — Encounter (HOSPITAL_COMMUNITY): Payer: Medicare (Managed Care)

## 2022-04-07 DIAGNOSIS — E785 Hyperlipidemia, unspecified: Secondary | ICD-10-CM | POA: Diagnosis not present

## 2022-04-07 DIAGNOSIS — H539 Unspecified visual disturbance: Secondary | ICD-10-CM | POA: Diagnosis not present

## 2022-04-07 DIAGNOSIS — I4891 Unspecified atrial fibrillation: Secondary | ICD-10-CM | POA: Diagnosis not present

## 2022-04-07 DIAGNOSIS — I10 Essential (primary) hypertension: Secondary | ICD-10-CM | POA: Diagnosis not present

## 2022-04-07 DIAGNOSIS — R0989 Other specified symptoms and signs involving the circulatory and respiratory systems: Secondary | ICD-10-CM

## 2022-04-15 ENCOUNTER — Inpatient Hospital Stay (HOSPITAL_COMMUNITY): Admission: RE | Admit: 2022-04-15 | Payer: Medicare (Managed Care) | Source: Ambulatory Visit

## 2022-04-21 ENCOUNTER — Encounter (HOSPITAL_COMMUNITY): Payer: Self-pay

## 2022-06-14 ENCOUNTER — Encounter (HOSPITAL_BASED_OUTPATIENT_CLINIC_OR_DEPARTMENT_OTHER): Payer: Self-pay | Admitting: Pharmacy Technician

## 2022-06-14 ENCOUNTER — Emergency Department (HOSPITAL_BASED_OUTPATIENT_CLINIC_OR_DEPARTMENT_OTHER)
Admission: EM | Admit: 2022-06-14 | Discharge: 2022-06-14 | Disposition: A | Discharge status: Home / Self Care | Payer: Medicare HMO | Attending: Emergency Medicine | Admitting: Emergency Medicine

## 2022-06-14 DIAGNOSIS — Z79899 Other long term (current) drug therapy: Secondary | ICD-10-CM | POA: Insufficient documentation

## 2022-06-14 DIAGNOSIS — Z7982 Long term (current) use of aspirin: Secondary | ICD-10-CM | POA: Insufficient documentation

## 2022-06-14 DIAGNOSIS — Z794 Long term (current) use of insulin: Secondary | ICD-10-CM | POA: Insufficient documentation

## 2022-06-14 DIAGNOSIS — J449 Chronic obstructive pulmonary disease, unspecified: Secondary | ICD-10-CM | POA: Diagnosis not present

## 2022-06-14 DIAGNOSIS — N3000 Acute cystitis without hematuria: Secondary | ICD-10-CM | POA: Insufficient documentation

## 2022-06-14 DIAGNOSIS — J45909 Unspecified asthma, uncomplicated: Secondary | ICD-10-CM | POA: Diagnosis not present

## 2022-06-14 DIAGNOSIS — R300 Dysuria: Secondary | ICD-10-CM | POA: Diagnosis present

## 2022-06-14 DIAGNOSIS — I10 Essential (primary) hypertension: Secondary | ICD-10-CM | POA: Diagnosis not present

## 2022-06-14 DIAGNOSIS — Z7984 Long term (current) use of oral hypoglycemic drugs: Secondary | ICD-10-CM | POA: Insufficient documentation

## 2022-06-14 LAB — URINALYSIS, ROUTINE W REFLEX MICROSCOPIC
Bilirubin Urine: NEGATIVE
Glucose, UA: NEGATIVE mg/dL
Ketones, ur: NEGATIVE mg/dL
Nitrite: POSITIVE — AB
Protein, ur: 30 mg/dL — AB
RBC / HPF: >50 RBC/hpf — ABNORMAL HIGH (ref 0–5)
Specific Gravity, Urine: 1.007 (ref 1.005–1.030)
WBC, UA: >50 WBC/hpf — ABNORMAL HIGH (ref 0–5)
pH: 5.5 (ref 5.0–8.0)

## 2022-06-14 MED ORDER — CEFDINIR 300 MG PO CAPS: 300.0000 mg | ORAL_CAPSULE | Freq: Two times a day (BID) | ORAL | 0 refills | Status: AC

## 2022-06-14 MED ORDER — CEFDINIR 300 MG PO CAPS
300.0000 mg | ORAL_CAPSULE | Freq: Two times a day (BID) | ORAL | Status: DC
  Administered 2022-06-14: 300 mg via ORAL
  Filled 2022-06-14: qty 1

## 2022-06-14 NOTE — ED Provider Notes (Signed)
[facility] EMERGENCY DEPT Provider Note   CSN: [id number] Arrival date & time: 06/14/22  1752     History  Chief complaint dysuria  Leslie [name] is a 76 y.o. male.  HPI   Patient has a history of asthma, GERD, hyperlipidemia, hypertension, neuropathy, COPD and chronic indwelling Foley catheter.  Patient states he started noticing some blood and pus in his catheter.  He came to the ED for further evaluation.  He denies any fevers or chills.  No abdominal pain.  No vomiting or diarrhea  Home Medications Prior to Admission medications   Medication Sig Start Date End Date Taking? Authorizing Provider  acetaminophen (TYLENOL) 325 MG tablet Take 2 tablets (650 mg total) by mouth every 6 (six) hours as needed for mild pain (or Fever >/= 101). Patient taking differently: Take 650 mg by mouth every 8 (eight) hours as needed for mild pain (or Fever >/= 101). 03/30/18   Marcelyn Bruins, MD  albuterol (PROVENTIL HFA;VENTOLIN HFA) 108 (90 Base) MCG/ACT inhaler Inhale 2 puffs into the lungs 4 (four) times daily as needed for wheezing or shortness of breath.     [provider]  albuterol (PROVENTIL) (2.5 MG/3ML) 0.083% nebulizer solution Take 2.5 mg by nebulization every 6 (six) hours as needed for wheezing or shortness of breath.    [provider]  allopurinol (ZYLOPRIM) 100 MG tablet Take 100 mg by mouth at bedtime. For gout    [provider]  AMINO ACIDS-PROTEIN HYDROLYS PO Take 1 Dose by mouth 2 (two) times daily. Add SF Pro-Stat    [provider]  amLODipine (NORVASC) 10 MG tablet Take 1 tablet (10 mg total) by mouth daily. 08/09/12   Funches, Adriana Mccallum, MD  ascorbic acid (VITAMIN C) 1000 MG tablet Take 1 tablet (1,000 mg total) by mouth daily. 09/14/21   Gaylan Gerold, DO  aspirin 81 MG chewable tablet Chew 81 mg by mouth daily.    [provider]  calcium carbonate (TUMS EX) 750 MG chewable tablet Chew 1 tablet by mouth 2 (two)  times daily as needed for heartburn.    [provider]  cefdinir (OMNICEF) 300 MG capsule Take 1 capsule (300 mg total) by mouth 2 (two) times daily. 12/25/21   Margarita Mail, PA-C  Cholecalciferol (VITAMIN D-3) 1000 units CAPS Take 1,000 Units by mouth daily.    [provider]  escitalopram (LEXAPRO) 10 MG tablet Take 10 mg by mouth daily.    [provider]  Evolocumab with Infusor ([place]) 420 MG/3.5ML SOCT Inject 420 mg into the skin every 30 (thirty) days.    [provider]  ferrous sulfate 325 (65 FE) MG tablet Take 325 mg by mouth every other day.    [provider]  finasteride (PROSCAR) 5 MG tablet Take 1 tablet by mouth daily. 12/05/77     folic acid (FOLVITE) 1 MG tablet Take 1 tablet (1 mg total) by mouth daily. 09/14/21   Gaylan Gerold, DO  insulin aspart (NOVOLOG) 100 UNIT/ML injection Inject 0-9 Units into the skin 3 (three) times daily with meals. 10/07/21   Demaio, Alexa, MD  Melatonin 10 MG TABS Take 10 mg by mouth at bedtime.    [provider]  metFORMIN (GLUCOPHAGE) 850 MG tablet Take 850 mg by mouth 2 (two) times daily with a meal.    [provider]  Multiple Vitamins-Minerals (CERTAVITE SENIOR/ANTIOXIDANT) TABS Take 1 tablet by mouth daily.    [provider]  nitroGLYCERIN (NITROSTAT) 0.4 MG SL tablet Place 0.4 mg under the tongue every 5 (five) minutes x 3 doses as needed for chest pain.  11/12/11   Funches, Adriana Mccallum, MD  oxyCODONE (OXY IR/ROXICODONE) 5 MG immediate release tablet Take 1-2 tablets (5-10 mg total) by mouth every 4 (four) hours as needed for moderate pain (pain score 4-6). 09/13/21   Timothy Lasso, MD  oxyCODONE 10 MG TABS Take 1-1.5 tablets (10-15 mg total) by mouth every 4 (four) hours as needed for severe pain (pain score 7-10). 09/13/21   Timothy Lasso, MD  pantoprazole (PROTONIX) 40 MG tablet Take 1 tablet (40 mg total) by mouth 2 (two) times daily. 10/07/21  12/06/21  Demaio, Roxine Caddy, MD  pregabalin (LYRICA) 75 MG capsule Take 1 capsule (75 mg total) by mouth 2 (two) times daily. 10/24/21   Newt Minion, MD  pregabalin (LYRICA) 75 MG capsule Take 1 capsule (75 mg total) by mouth 2 (two) times daily. 10/24/21   Newt Minion, MD  senna-docusate (SENOKOT-S) 8.6-50 MG tablet Take 2 tablets by mouth daily.    [provider]  Skin Protectants, Misc. (MINERIN) CREA Apply 1 application topically 2 (two) times daily as needed (dryness).    [provider]  sucralfate (CARAFATE) 1 g tablet Take 1 tablet (1 g total) by mouth 4 (four) times daily -  with meals and at bedtime. 09/13/21   Gaylan Gerold, DO  tamsulosin (FLOMAX) 0.4 MG CAPS capsule Take 1 capsule by mouth at bedtime 10/31/21     traZODone (DESYREL) 50 MG tablet Take 50 mg by mouth at bedtime.    [provider]  vitamin B-12 (CYANOCOBALAMIN) 1000 MCG tablet Take 1,000 mcg by mouth daily.    [provider]      Allergies    Atorvastatin, Statins, and Flexeril [cyclobenzaprine]    Review of Systems   Review of Systems  Physical Exam Updated Vital Signs BP (!) 170/69 (BP Location: Right Arm)   Pulse 89   Temp 99 F (37.2 C) (Oral)   Resp 16   SpO2 100%  Physical Exam Vitals and nursing note reviewed.  Constitutional:      General: He is not in acute distress.    Appearance: He is well-developed.  HENT:     Head: Normocephalic and atraumatic.     Right Ear: External ear normal.     Left Ear: External ear normal.  Eyes:     General: No scleral icterus.       Right eye: No discharge.        Left eye: No discharge.     Conjunctiva/sclera: Conjunctivae normal.  Neck:     Trachea: No tracheal deviation.  Cardiovascular:     Rate and Rhythm: Normal rate.  Pulmonary:     Effort: Pulmonary effort is normal. No respiratory distress.     Breath sounds: No stridor.  Abdominal:     General: There is no distension.  Genitourinary:    Comments: Urine is  cloudy in the catheter, some drainage noted around urethra Musculoskeletal:        General: No swelling or deformity.     Cervical back: Neck supple.     Comments: Lower extremity prostheses noted  Skin:    General: Skin is warm and dry.     Findings: No rash.  Neurological:     Mental Status: He is alert.     Cranial Nerves: Cranial nerve deficit: no gross deficits.  ED Results / Procedures / Treatments   Labs (all labs ordered are listed, but only abnormal results are displayed) Labs Reviewed  URINALYSIS, ROUTINE W REFLEX MICROSCOPIC - Abnormal; Notable for the following components:      Result Value   APPearance CLOUDY (*)    Hgb urine dipstick MODERATE (*)    Protein, ur 30 (*)    Nitrite POSITIVE (*)    Leukocytes,Ua LARGE (*)    RBC / HPF >50 (*)    WBC, UA >50 (*)    Bacteria, UA MANY (*)    All other components within normal limits  URINE CULTURE    EKG None  Radiology No results found.  Procedures Procedures    Medications Ordered in ED Medications  cefdinir (OMNICEF) capsule 300 mg (has no administration in time range)    ED Course/ Medical Decision Making/ A&P Clinical Course as of 06/14/22 1933  Sat Jun 14, 2022  1932 Urinalysis is consistent with infection [JK]    Clinical Course User Index [JK] Dorie Rank, MD                           Medical Decision Making Problems Addressed: Acute cystitis without hematuria: acute illness or injury  Amount and/or Complexity of Data Reviewed Labs: ordered. Decision-making details documented in ED Course.  Risk Prescription drug management.   Patient presented to ED for urinary discomfort and discoloration in the urine.  Patient does have an indwelling catheter.  Did consider the possibility of colonization however he does have some drainage around the urethra.  Patient's catheter was replaced.  Will discharge home on course of antibiotics.  Patient is not having any systemic symptoms.  Recommend  close outpatient follow-up with PCP        Final Clinical Impression(s) / ED Diagnoses Final diagnoses:  Acute cystitis without hematuria    Rx / DC Orders ED Discharge Orders     None         Dorie Rank, MD 06/14/22 1933

## 2022-06-14 NOTE — ED Notes (Signed)
Pt will be going home via [facility]

## 2022-06-14 NOTE — ED Triage Notes (Signed)
Pt bib ems from home with reports of puss draining in foley. Pt reports foley placed approx 2 months ago and he has not had it changed. Denies pain, fever.

## 2022-06-14 NOTE — ED Notes (Signed)
[facility] called for transport.  

## 2022-06-14 NOTE — Discharge Instructions (Signed)
Take the antibiotics as prescribed.  Follow-up with your doctor to recheck on your urine culture and to make sure your symptoms improve

## 2022-06-16 ENCOUNTER — Ambulatory Visit: Payer: Medicare HMO | Admitting: Neurology

## 2022-06-17 LAB — URINE CULTURE: Culture: >=100000 — AB

## 2022-06-18 ENCOUNTER — Telehealth: Payer: Self-pay

## 2022-06-18 NOTE — Progress Notes (Signed)
ED Antimicrobial Stewardship Positive Culture Follow Up   Leslie [name] is an 76 y.o. male who presented to [facility] on 06/14/2022 with a chief complaint of No chief complaint on file.   Recent Results (from the past 720 hour(s))  Urine Culture     Status: Abnormal   Collection Time: 06/14/22  5:57 PM   Specimen: Urine, Catheterized  Result Value Ref Range Status   Specimen Description   Final    URINE, CATHETERIZED Performed at [facility], [address] [place], [place] [address]    Special Requests   Final    NONE Performed at [facility], [address] [place], [place] [address]    Culture (A)  Final    >=100,000 COLONIES/mL METHICILLIN RESISTANT STAPHYLOCOCCUS AUREUS 20,000 COLONIES/mL KLEBSIELLA PNEUMONIAE    Report Status 06/17/2022 FINAL  Final   Organism ID, Bacteria METHICILLIN RESISTANT STAPHYLOCOCCUS AUREUS (A)  Final   Organism ID, Bacteria KLEBSIELLA PNEUMONIAE (A)  Final      Susceptibility   Klebsiella pneumoniae - MIC*    AMPICILLIN RESISTANT Resistant     CEFAZOLIN <=4 SENSITIVE Sensitive     CEFEPIME <=0.12 SENSITIVE Sensitive     CEFTRIAXONE <=0.25 SENSITIVE Sensitive     CIPROFLOXACIN <=0.25 SENSITIVE Sensitive     GENTAMICIN <=1 SENSITIVE Sensitive     IMIPENEM <=0.25 SENSITIVE Sensitive     NITROFURANTOIN 32 SENSITIVE Sensitive     TRIMETH/SULFA <=20 SENSITIVE Sensitive     AMPICILLIN/SULBACTAM <=2 SENSITIVE Sensitive     PIP/TAZO <=4 SENSITIVE Sensitive     * 20,000 COLONIES/mL KLEBSIELLA PNEUMONIAE   Methicillin resistant staphylococcus aureus - MIC*    CIPROFLOXACIN >=8 RESISTANT Resistant     GENTAMICIN <=0.5 SENSITIVE Sensitive     NITROFURANTOIN <=16 SENSITIVE Sensitive     OXACILLIN >=4 RESISTANT Resistant     TETRACYCLINE <=1 SENSITIVE Sensitive     VANCOMYCIN <=0.5 SENSITIVE Sensitive     TRIMETH/SULFA <=10 SENSITIVE Sensitive     CLINDAMYCIN RESISTANT Resistant     RIFAMPIN <=0.5  SENSITIVE Sensitive     Inducible Clindamycin POSITIVE Resistant     * >=100,000 COLONIES/mL METHICILLIN RESISTANT STAPHYLOCOCCUS AUREUS    '[x]'$  Treated with cefdinir, organism resistant to prescribed antimicrobial '[]'$  Patient discharged originally without antimicrobial agent and treatment is now indicated  New antibiotic prescription: DC cefdinir, start macrobid '100mg'$  PO BID x 7 days  ED Provider: Benedetto Goad, PA-C   [place], Rande Lawman 06/18/2022, 8:40 AM Clinical Pharmacist Monday - Friday phone -  [phone] Saturday - Sunday phone - [phone]

## 2022-06-18 NOTE — Telephone Encounter (Signed)
Post ED Visit - Positive Culture Follow-up: Unsuccessful Patient Follow-up  Culture assessed and recommendations reviewed by:  '[]'$  Elenor Quinones, Pharm.D. '[]'$  Heide Guile, Pharm.D., BCPS AQ-ID '[]'$  Parks Neptune, Pharm.D., BCPS '[]'$  Alycia Rossetti, Pharm.D., BCPS '[]'$  [place], Pharm.D., BCPS, AAHIVP '[]'$  Legrand Como, Pharm.D., BCPS, AAHIVP '[]'$  Wynell Balloon, PharmD '[]'$  Vincenza Hews, PharmD, BCPS  Positive Urine culture  '[]'$  Patient discharged without antimicrobial prescription and treatment is now indicated '[x]'$  Organism is resistant to prescribed ED discharge antimicrobial '[]'$  Patient with positive blood cultures   Plan: Stop Cefdinir and start Macrobid 100 mg po BID x 7 days per ED provider Benedetto Goad, PA-C   Unable to contact patient after 3 attempts, letter will be sent to address on file  Glennon Hamilton 06/18/2022, 4:08 PM

## 2022-07-03 ENCOUNTER — Other Ambulatory Visit: Payer: Self-pay

## 2022-07-03 ENCOUNTER — Emergency Department (HOSPITAL_COMMUNITY)
Admission: EM | Admit: 2022-07-03 | Discharge: 2022-07-03 | Disposition: A | Discharge status: Home / Self Care | Payer: Medicare HMO | Attending: Emergency Medicine | Admitting: Emergency Medicine

## 2022-07-03 ENCOUNTER — Encounter (HOSPITAL_COMMUNITY): Payer: Self-pay

## 2022-07-03 DIAGNOSIS — Z794 Long term (current) use of insulin: Secondary | ICD-10-CM | POA: Diagnosis not present

## 2022-07-03 DIAGNOSIS — T839XXA Unspecified complication of genitourinary prosthetic device, implant and graft, initial encounter: Secondary | ICD-10-CM

## 2022-07-03 DIAGNOSIS — Z79899 Other long term (current) drug therapy: Secondary | ICD-10-CM | POA: Diagnosis not present

## 2022-07-03 DIAGNOSIS — E119 Type 2 diabetes mellitus without complications: Secondary | ICD-10-CM | POA: Insufficient documentation

## 2022-07-03 DIAGNOSIS — T83091A Other mechanical complication of indwelling urethral catheter, initial encounter: Secondary | ICD-10-CM | POA: Insufficient documentation

## 2022-07-03 DIAGNOSIS — J449 Chronic obstructive pulmonary disease, unspecified: Secondary | ICD-10-CM | POA: Diagnosis not present

## 2022-07-03 DIAGNOSIS — I10 Essential (primary) hypertension: Secondary | ICD-10-CM | POA: Insufficient documentation

## 2022-07-03 DIAGNOSIS — Z7982 Long term (current) use of aspirin: Secondary | ICD-10-CM | POA: Insufficient documentation

## 2022-07-03 LAB — URINALYSIS, ROUTINE W REFLEX MICROSCOPIC
Bacteria, UA: NONE SEEN
Bilirubin Urine: NEGATIVE
Glucose, UA: NEGATIVE mg/dL
Ketones, ur: NEGATIVE mg/dL
Nitrite: NEGATIVE
Protein, ur: NEGATIVE mg/dL
Specific Gravity, Urine: 1.003 — ABNORMAL LOW (ref 1.005–1.030)
pH: 6 (ref 5.0–8.0)

## 2022-07-03 MED ORDER — CEPHALEXIN 500 MG PO CAPS
500.0000 mg | ORAL_CAPSULE | Freq: Three times a day (TID) | ORAL | 0 refills | Status: AC
  Filled 2022-07-03 – 2022-07-04 (×2): qty 21, 7d supply, fill #0

## 2022-07-03 NOTE — ED Triage Notes (Signed)
EMS reports from home, c/o urinary cather needs changed. Has not been changed in a month. Pt bi-lateral BKA  BP 142/88 HR 88 RR 18 Sp02 98 RA CBG 142

## 2022-07-03 NOTE — ED Provider Notes (Signed)
[facility] DEPT Provider Note   CSN: [id number] Arrival date & time: 07/03/22  1636     History  Chief Complaint  Patient presents with   Urinary Catheter Problem    Leslie [name] is a 76 y.o. male.  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  States it has not been changed in a month.  Denies any dysuria, fevers, or suprapubic tenderness.  The history is provided by the patient. No language interpreter was used.       Home Medications Prior to Admission medications   Medication Sig Start Date End Date Taking? Authorizing Provider  cephALEXin (KEFLEX) 500 MG capsule Take 1 capsule (500 mg total) by mouth 3 (three) times daily for 7 days. 07/03/22 7/98/92 Yes Campbell Stall P, DO  acetaminophen (TYLENOL) 325 MG tablet Take 2 tablets (650 mg total) by mouth every 6 (six) hours as needed for mild pain (or Fever >/= 101). Patient taking differently: Take 650 mg by mouth every 8 (eight) hours as needed for mild pain (or Fever >/= 101). 03/30/18   Marcelyn Bruins, MD  albuterol (PROVENTIL HFA;VENTOLIN HFA) 108 (90 Base) MCG/ACT inhaler Inhale 2 puffs into the lungs 4 (four) times daily as needed for wheezing or shortness of breath.     [provider]  albuterol (PROVENTIL) (2.5 MG/3ML) 0.083% nebulizer solution Take 2.5 mg by nebulization every 6 (six) hours as needed for wheezing or shortness of breath.    [provider]  allopurinol (ZYLOPRIM) 100 MG tablet Take 100 mg by mouth at bedtime. For gout    [provider]  AMINO ACIDS-PROTEIN HYDROLYS PO Take 1 Dose by mouth 2 (two) times daily. Add SF Pro-Stat    [provider]  amLODipine (NORVASC) 10 MG tablet Take 1 tablet (10 mg total) by mouth daily.  08/09/12   Funches, Adriana Mccallum, MD  ascorbic acid (VITAMIN C) 1000 MG tablet Take 1 tablet (1,000 mg total) by mouth daily. 09/14/21   Gaylan Gerold, DO  aspirin 81 MG chewable tablet Chew 81 mg by mouth daily.    [provider]  calcium carbonate (TUMS EX) 750 MG chewable tablet Chew 1 tablet by mouth 2 (two) times daily as needed for heartburn.    [provider]  Cholecalciferol (VITAMIN D-3) 1000 units CAPS Take 1,000 Units by mouth daily.    [provider]  escitalopram (LEXAPRO) 10 MG tablet Take 10 mg by mouth daily.    [provider]  Evolocumab with Infusor ([place]) 420 MG/3.5ML SOCT Inject 420 mg into the skin every 30 (thirty) days.    [provider]  ferrous sulfate 325 (65 FE) MG tablet Take 325 mg by mouth every other day.    [provider]  finasteride (PROSCAR) 5 MG tablet Take 1 tablet by mouth daily. 10/27/92     folic acid (FOLVITE) 1 MG tablet Take 1 tablet (1 mg total) by mouth daily. 09/14/21   Gaylan Gerold, DO  insulin aspart (NOVOLOG) 100 UNIT/ML injection Inject 0-9 Units into the skin 3 (three) times daily with meals. 10/07/21   Demaio, Alexa, MD  Melatonin 10 MG TABS Take 10 mg by mouth at bedtime.    [provider]  metFORMIN (GLUCOPHAGE) 850 MG tablet Take 850 mg by mouth 2 (two)  times daily with a meal.    [provider]  Multiple Vitamins-Minerals (CERTAVITE SENIOR/ANTIOXIDANT) TABS Take 1 tablet by mouth daily.    [provider]  nitroGLYCERIN (NITROSTAT) 0.4 MG SL tablet Place 0.4 mg under the tongue every 5 (five) minutes x 3 doses as needed for chest pain.  11/12/11   Funches, Adriana Mccallum, MD  oxyCODONE (OXY IR/ROXICODONE) 5 MG immediate release tablet Take 1-2 tablets (5-10 mg total) by mouth every 4 (four) hours as needed for moderate pain (pain score 4-6). 09/13/21   Timothy Lasso, MD  oxyCODONE 10 MG TABS Take 1-1.5 tablets (10-15 mg total) by mouth every 4 (four)  hours as needed for severe pain (pain score 7-10). 09/13/21   Timothy Lasso, MD  pantoprazole (PROTONIX) 40 MG tablet Take 1 tablet (40 mg total) by mouth 2 (two) times daily. 10/07/21 12/06/21  Demaio, Roxine Caddy, MD  pregabalin (LYRICA) 75 MG capsule Take 1 capsule (75 mg total) by mouth 2 (two) times daily. 10/24/21   Newt Minion, MD  pregabalin (LYRICA) 75 MG capsule Take 1 capsule (75 mg total) by mouth 2 (two) times daily. 10/24/21   Newt Minion, MD  senna-docusate (SENOKOT-S) 8.6-50 MG tablet Take 2 tablets by mouth daily.    [provider]  Skin Protectants, Misc. (MINERIN) CREA Apply 1 application topically 2 (two) times daily as needed (dryness).    [provider]  sucralfate (CARAFATE) 1 g tablet Take 1 tablet (1 g total) by mouth 4 (four) times daily -  with meals and at bedtime. 09/13/21   Gaylan Gerold, DO  tamsulosin (FLOMAX) 0.4 MG CAPS capsule Take 1 capsule by mouth at bedtime 10/31/21     traZODone (DESYREL) 50 MG tablet Take 50 mg by mouth at bedtime.    [provider]  vitamin B-12 (CYANOCOBALAMIN) 1000 MCG tablet Take 1,000 mcg by mouth daily.    [provider]      Allergies    Atorvastatin, Statins, and Flexeril [cyclobenzaprine]    Review of Systems   Review of Systems  Constitutional:  Negative for chills and fever.  Respiratory:  Negative for shortness of breath.   Cardiovascular:  Negative for chest pain and palpitations.  Gastrointestinal:  Negative for abdominal pain and vomiting.  Genitourinary:  Negative for difficulty urinating, dysuria and hematuria.  Musculoskeletal:  Negative for back pain.  All other systems reviewed and are negative.   Physical Exam Updated Vital Signs BP (!) 153/73   Pulse 75   Temp 97.9 F (36.6 C) (Oral)   Resp 18   SpO2 98%  Physical Exam Vitals and nursing note reviewed.  Constitutional:      General: He is not in acute distress.    Appearance: He is well-developed.  HENT:      Head: Normocephalic and atraumatic.  Eyes:     Conjunctiva/sclera: Conjunctivae normal.  Cardiovascular:     Rate and Rhythm: Normal rate and regular rhythm.     Heart sounds: No murmur heard. Pulmonary:     Effort: Pulmonary effort is normal. No respiratory distress.     Breath sounds: Normal breath sounds.  Abdominal:     Palpations: Abdomen is soft.     Tenderness: There is no abdominal tenderness.  Musculoskeletal:        General: No swelling.     Cervical back: Neck supple.       Legs:  Skin:    General: Skin is warm and dry.  Capillary Refill: Capillary refill takes less than 2 seconds.  Neurological:     Mental Status: He is alert.  Psychiatric:        Mood and Affect: Mood normal.     ED Results / Procedures / Treatments   Labs (all labs ordered are listed, but only abnormal results are displayed) Labs Reviewed  URINALYSIS, ROUTINE W REFLEX MICROSCOPIC    EKG None  Radiology No results found.  Procedures Procedures    Medications Ordered in ED Medications - No data to display  ED Course/ Medical Decision Making/ A&P                           Medical Decision Making Amount and/or Complexity of Data Reviewed Labs: ordered.   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  Patient is alert oriented x3, no acute distress, afebrile, stable vital signs.  Soft abdomen without tenderness.  Foley catheter exchanged without any difficulties.  Urine sent off for urine analysis.  Patient states he gets urinary tract infection each time he has a Foley catheter exchange.  Does not currently have close contact with his primary care physician.  We will send antibiotics prophylactically to pharmacy in case symptoms develop.  Patient is otherwise well-appearing with no  signs or symptoms of sepsis.  NO Urinary retention.  Safe for discharge at this time.   Patient in no distress and overall condition improved here in the ED. Detailed discussions were had with the patient regarding current findings, and need for close f/u with PCP or on call doctor. The patient has been instructed to return immediately if the symptoms worsen in any way for re-evaluation. Patient verbalized understanding and is in agreement with current care plan. All questions answered prior to discharge.  Outside records:1}    Final Clinical Impression(s) / ED Diagnoses Final diagnoses:  Problem with Foley catheter, initial encounter [facility])    Rx / DC Orders ED Discharge Orders          Ordered    cephALEXin (KEFLEX) 500 MG capsule  3 times daily        07/03/22 1821              Lianne Cure, DO 60/63/01 Vernelle Emerald

## 2022-07-04 ENCOUNTER — Other Ambulatory Visit (HOSPITAL_COMMUNITY): Payer: Self-pay

## 2022-07-21 ENCOUNTER — Emergency Department (HOSPITAL_COMMUNITY)
Admission: EM | Admit: 2022-07-21 | Discharge: 2022-07-21 | Disposition: A | Discharge status: Home / Self Care | Payer: Medicare HMO | Attending: Emergency Medicine | Admitting: Emergency Medicine

## 2022-07-21 ENCOUNTER — Other Ambulatory Visit: Payer: Self-pay

## 2022-07-21 DIAGNOSIS — N390 Urinary tract infection, site not specified: Secondary | ICD-10-CM | POA: Diagnosis not present

## 2022-07-21 DIAGNOSIS — R300 Dysuria: Secondary | ICD-10-CM | POA: Diagnosis present

## 2022-07-21 DIAGNOSIS — Z7982 Long term (current) use of aspirin: Secondary | ICD-10-CM | POA: Insufficient documentation

## 2022-07-21 LAB — URINALYSIS, ROUTINE W REFLEX MICROSCOPIC
Glucose, UA: NEGATIVE mg/dL
Ketones, ur: NEGATIVE mg/dL
Nitrite: NEGATIVE
Protein, ur: >300 mg/dL — AB
Specific Gravity, Urine: >1.03 — ABNORMAL HIGH (ref 1.005–1.030)
pH: 6 (ref 5.0–8.0)

## 2022-07-21 LAB — URINALYSIS, MICROSCOPIC (REFLEX)

## 2022-07-21 MED ORDER — CEPHALEXIN 250 MG PO CAPS
500.0000 mg | ORAL_CAPSULE | Freq: Once | ORAL | Status: AC
  Administered 2022-07-21: 500 mg via ORAL
  Filled 2022-07-21: qty 2

## 2022-07-21 MED ORDER — CEPHALEXIN 500 MG PO CAPS
500.0000 mg | ORAL_CAPSULE | Freq: Two times a day (BID) | ORAL | 0 refills | Status: AC
  Filled 2022-07-21 – 2022-07-22 (×2): qty 10, 5d supply, fill #0

## 2022-07-21 NOTE — ED Provider Notes (Signed)
[facility] EMERGENCY DEPARTMENT Provider Note   CSN: [id number] Arrival date & time: 07/21/22  1100     History  No chief complaint on file.   Leslie [name] is a 76 y.o. male.  HPI Patient presents from home via EMS with concern for dysuria.  He has had an indwelling Foley catheter for at least 1 month, placed after he had acute urinary retention.  Over the past day or so he developed dysuria, which he identifies as being an indicator for Foley catheter exchange.  No fever, abdominal pain, nausea, vomiting or other complaints.    Home Medications Prior to Admission medications   Medication Sig Start Date End Date Taking? Authorizing Provider  cephALEXin (KEFLEX) 500 MG capsule Take 1 capsule (500 mg total) by mouth 2 (two) times daily for 5 days. 07/21/22 07/26/22 Yes Carmin Muskrat, MD  acetaminophen (TYLENOL) 325 MG tablet Take 2 tablets (650 mg total) by mouth every 6 (six) hours as needed for mild pain (or Fever >/= 101). Patient taking differently: Take 650 mg by mouth every 8 (eight) hours as needed for mild pain (or Fever >/= 101). 03/30/18   Marcelyn Bruins, MD  albuterol (PROVENTIL HFA;VENTOLIN HFA) 108 (90 Base) MCG/ACT inhaler Inhale 2 puffs into the lungs 4 (four) times daily as needed for wheezing or shortness of breath.     [provider]  albuterol (PROVENTIL) (2.5 MG/3ML) 0.083% nebulizer solution Take 2.5 mg by nebulization every 6 (six) hours as needed for wheezing or shortness of breath.    [provider]  allopurinol (ZYLOPRIM) 100 MG tablet Take 100 mg by mouth at bedtime. For gout    [provider]  AMINO ACIDS-PROTEIN HYDROLYS PO Take 1 Dose by mouth 2 (two) times daily. Add SF Pro-Stat    [provider]  amLODipine (NORVASC) 10 MG tablet Take 1 tablet (10 mg total) by mouth daily. 08/09/12   Funches, Adriana Mccallum, MD  ascorbic acid (VITAMIN C) 1000 MG tablet Take 1 tablet (1,000 mg total) by mouth daily.  09/14/21   Gaylan Gerold, DO  aspirin 81 MG chewable tablet Chew 81 mg by mouth daily.    [provider]  calcium carbonate (TUMS EX) 750 MG chewable tablet Chew 1 tablet by mouth 2 (two) times daily as needed for heartburn.    [provider]  Cholecalciferol (VITAMIN D-3) 1000 units CAPS Take 1,000 Units by mouth daily.    [provider]  escitalopram (LEXAPRO) 10 MG tablet Take 10 mg by mouth daily.    [provider]  Evolocumab with Infusor ([place]) 420 MG/3.5ML SOCT Inject 420 mg into the skin every 30 (thirty) days.    [provider]  ferrous sulfate 325 (65 FE) MG tablet Take 325 mg by mouth every other day.    [provider]  finasteride (PROSCAR) 5 MG tablet Take 1 tablet by mouth daily. 10/27/92     folic acid (FOLVITE) 1 MG tablet Take 1 tablet (1 mg total) by mouth daily. 09/14/21   Gaylan Gerold, DO  insulin aspart (NOVOLOG) 100 UNIT/ML injection Inject 0-9 Units into the skin 3 (three) times daily with meals. 10/07/21   Demaio, Alexa, MD  Melatonin 10 MG TABS Take 10 mg by mouth at bedtime.    [provider]  metFORMIN (GLUCOPHAGE) 850 MG tablet Take 850 mg by mouth 2 (two) times daily with a meal.    [provider]  Multiple Vitamins-Minerals (CERTAVITE  SENIOR/ANTIOXIDANT) TABS Take 1 tablet by mouth daily.    [provider]  nitroGLYCERIN (NITROSTAT) 0.4 MG SL tablet Place 0.4 mg under the tongue every 5 (five) minutes x 3 doses as needed for chest pain.  11/12/11   Funches, Adriana Mccallum, MD  oxyCODONE (OXY IR/ROXICODONE) 5 MG immediate release tablet Take 1-2 tablets (5-10 mg total) by mouth every 4 (four) hours as needed for moderate pain (pain score 4-6). 09/13/21   Timothy Lasso, MD  oxyCODONE 10 MG TABS Take 1-1.5 tablets (10-15 mg total) by mouth every 4 (four) hours as needed for severe pain (pain score 7-10). 09/13/21   Timothy Lasso, MD  pantoprazole (PROTONIX) 40 MG tablet  Take 1 tablet (40 mg total) by mouth 2 (two) times daily. 10/07/21 12/06/21  Demaio, Roxine Caddy, MD  pregabalin (LYRICA) 75 MG capsule Take 1 capsule (75 mg total) by mouth 2 (two) times daily. 10/24/21   Newt Minion, MD  pregabalin (LYRICA) 75 MG capsule Take 1 capsule (75 mg total) by mouth 2 (two) times daily. 10/24/21   Newt Minion, MD  senna-docusate (SENOKOT-S) 8.6-50 MG tablet Take 2 tablets by mouth daily.    [provider]  Skin Protectants, Misc. (MINERIN) CREA Apply 1 application topically 2 (two) times daily as needed (dryness).    [provider]  sucralfate (CARAFATE) 1 g tablet Take 1 tablet (1 g total) by mouth 4 (four) times daily -  with meals and at bedtime. 09/13/21   Gaylan Gerold, DO  tamsulosin (FLOMAX) 0.4 MG CAPS capsule Take 1 capsule by mouth at bedtime 10/31/21     traZODone (DESYREL) 50 MG tablet Take 50 mg by mouth at bedtime.    [provider]  vitamin B-12 (CYANOCOBALAMIN) 1000 MCG tablet Take 1,000 mcg by mouth daily.    [provider]      Allergies    Atorvastatin, Statins, and Flexeril [cyclobenzaprine]    Review of Systems   Review of Systems  All other systems reviewed and are negative.   Physical Exam Updated Vital Signs BP (!) 150/88   Pulse 79   Temp 98.1 F (36.7 C) (Oral)   Resp 18   SpO2 99%  Physical Exam Vitals and nursing note reviewed.  Constitutional:      General: He is not in acute distress.    Appearance: He is well-developed. He is obese. He is not ill-appearing or diaphoretic.  HENT:     Head: Normocephalic and atraumatic.  Eyes:     Conjunctiva/sclera: Conjunctivae normal.  Cardiovascular:     Rate and Rhythm: Normal rate and regular rhythm.  Pulmonary:     Effort: Pulmonary effort is normal. No respiratory distress.     Breath sounds: No stridor.  Abdominal:     General: There is no distension.     Tenderness: There is no abdominal tenderness. There is no guarding.  Musculoskeletal:      Comments: Right BKA  Skin:    General: Skin is warm and dry.  Neurological:     Mental Status: He is alert and oriented to person, place, and time.     ED Results / Procedures / Treatments   Labs (all labs ordered are listed, but only abnormal results are displayed) Labs Reviewed  URINALYSIS, ROUTINE W REFLEX MICROSCOPIC - Abnormal; Notable for the following components:      Result Value   Color, Urine ORANGE (*)    APPearance CLOUDY (*)    Specific Gravity, Urine >1.030 (*)  Hgb urine dipstick LARGE (*)    Bilirubin Urine SMALL (*)    Protein, ur >300 (*)    Leukocytes,Ua MODERATE (*)    All other components within normal limits  URINALYSIS, MICROSCOPIC (REFLEX) - Abnormal; Notable for the following components:   Bacteria, UA FEW (*)    All other components within normal limits    EKG None  Radiology No results found.  Procedures Procedures    Medications Ordered in ED Medications  cephALEXin (KEFLEX) capsule 500 mg (500 mg Oral Given 07/21/22 2016)    ED Course/ Medical Decision Making/ A&P  This patient with a Hx of no medical problems, including prior amputation, recent placement of Foley catheter due to acute urinary retention presents to the ED for concern of dysuria, this involves an extensive number of treatment options, and is a complaint that carries with it a high risk of complications and morbidity.    The differential diagnosis includes urinary tract infection, bacteremia, sepsis, obstruction   Social Determinants of Health:  Age, diabetes, amputation  Additional history obtained:  Additional history and/or information obtained from chart review and from the family who arrives later, notable for details included above   After the initial evaluation, orders, including: Urinalysis were initiated.   The patient was also maintained on pulse oximetry. The readings were typically in the 9% room air normal   On repeat evaluation of the  patient improved Foley catheter exchanged by nursing staff Lab Tests:  I personally interpreted labs.  The pertinent results include: Some evidence for infection  Dispostion / Final MDM:  After consideration of the diagnostic results and the patient's response to treatment, patient is awake, alert, no distress, he remains hemodynamically unremarkable.  Patient accompanied by family, we discussed concern of infection versus obstruction, but with new Foley catheter placement, ongoing free urination, no evidence for obstruction.  Patient is afebrile, hemodynamically unremarkable, no evidence for bacteremia, sepsis.  With his description of dysuria and abnormal urinalysis he was started on Keflex and discharged to follow-up with both primary care and urology.   Final Clinical Impression(s) / ED Diagnoses Final diagnoses:  Urinary tract infection associated with indwelling urethral catheter, initial encounter [facility])    Rx / DC Orders ED Discharge Orders          Ordered    cephALEXin (KEFLEX) 500 MG capsule  2 times daily        07/21/22 2041              Carmin Muskrat, MD 07/21/22 2047

## 2022-07-21 NOTE — ED Triage Notes (Signed)
Pt with chronic foley here to have his foley changed. Reports he knows it's time to be changed when he has burning to the area.

## 2022-07-21 NOTE — ED Notes (Signed)
RN reviewed discharge instructions with pt. Pt verbalized understanding and had no further questions. VSS upon discharge.  

## 2022-07-21 NOTE — ED Provider Triage Note (Signed)
Emergency Medicine Provider Triage Evaluation Note  LIEF PALMATIER , a 76 y.o. male  was evaluated in triage.  Pt complains of foley cath replacement. Some burning at insertion site. Has not seen urology in some time. No fever, hematuria, flank pain, abd pain. Recently completed abx for UTI  Review of Systems  Positive: Foley cath change Negative: fever  Physical Exam  BP (!) 146/62   Pulse 88   Temp 98.7 F (37.1 C) (Oral)   Resp 16   SpO2 97%  Gen:   Awake, no distress   Resp:  Normal effort  MSK:   Moves extremities without difficulty  Other:  Cloudy urine  Medical Decision Making  Medically screening exam initiated at 12:06 PM.  Appropriate orders placed.  [name] was informed that the remainder of the evaluation will be completed by another provider, this initial triage assessment does not replace that evaluation, and the importance of remaining in the ED until their evaluation is complete.  Foley cath change   Sherby Moncayo A, PA-C 07/21/22 1207

## 2022-07-21 NOTE — Discharge Instructions (Addendum)
Please consider following up with our [facility] colleagues due to your new indwelling Foley catheter.  In addition, follow-up with your primary care physician as you have been diagnosed with a urinary tract infection today.  Return here for concerning changes in your condition.

## 2022-07-22 ENCOUNTER — Other Ambulatory Visit (HOSPITAL_COMMUNITY): Payer: Self-pay

## 2022-09-09 ENCOUNTER — Inpatient Hospital Stay (HOSPITAL_COMMUNITY): Admission: RE | Admit: 2022-09-09 | Payer: Medicare HMO | Source: Ambulatory Visit

## 2022-10-20 IMAGING — CT CT HEAD W/O CM
4 series · 16 of 47 positions shown, 18 images · non-contrast
Comparison: CT examination dated August 18, 2021

CLINICAL DATA: Vision loss, monocular.  Left vision changes.



[Series 3: head without · axial · non-contrast · 0.45mm/px · z∈[-69,+51]mm · 7 of 32 slices shown, 9 images]
[im 4/32  brain]
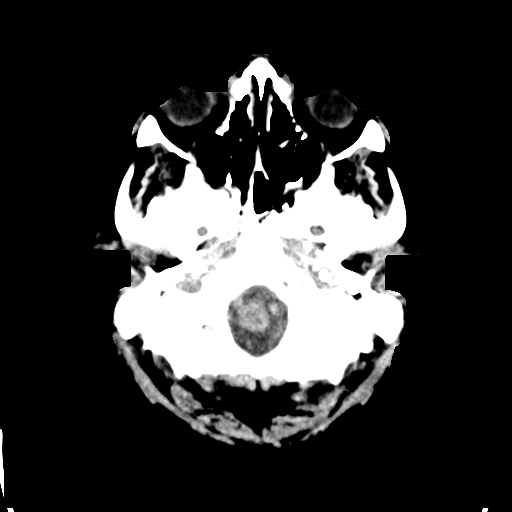
[im 4/32  bone]
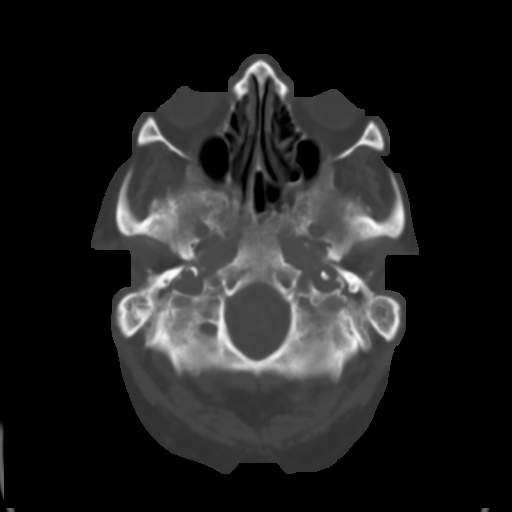
[im 8/32  brain]
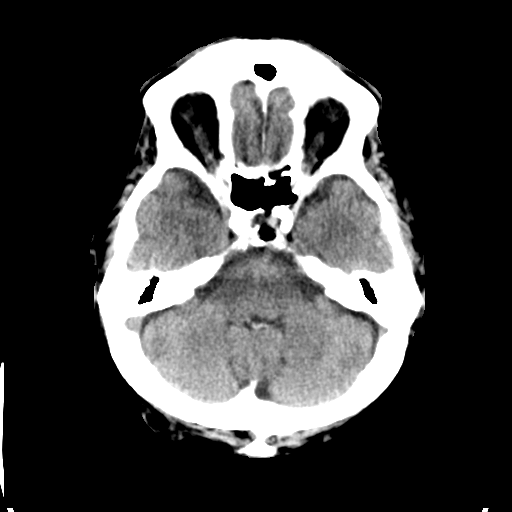
[im 12/32  brain]
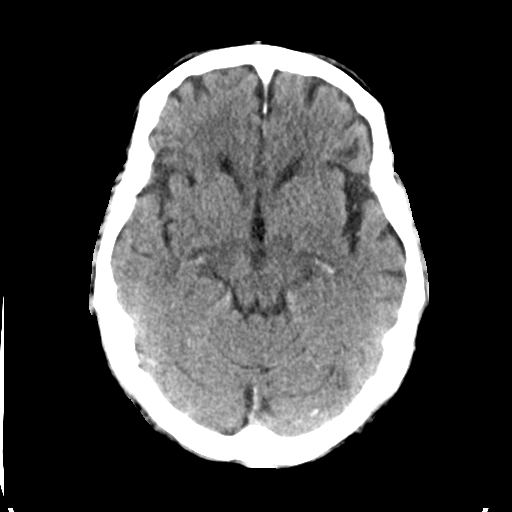
[im 16/32  brain]
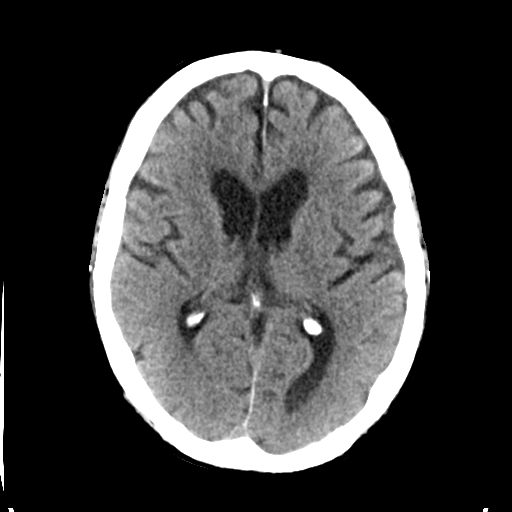
[im 20/32  brain]
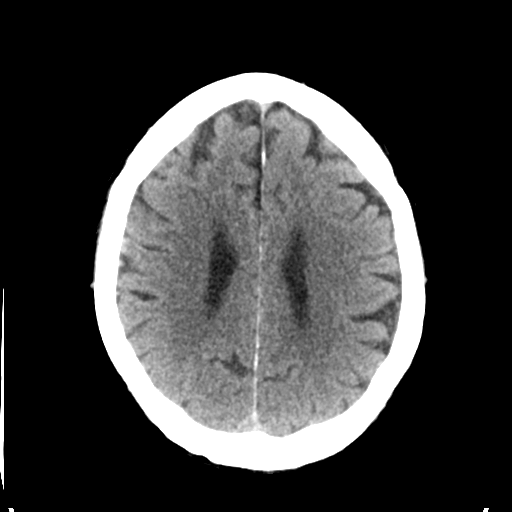
[im 20/32  bone]
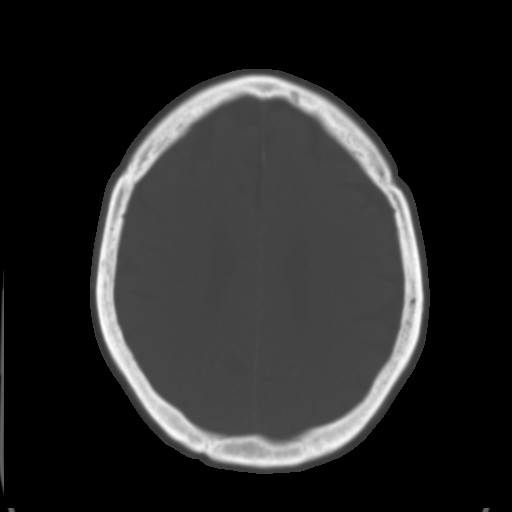
[im 24/32  brain]
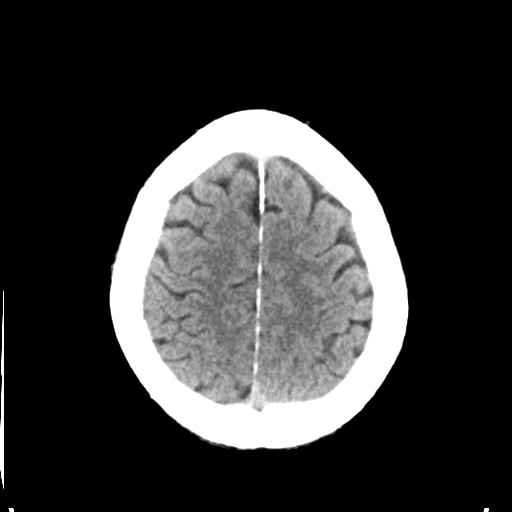
[im 28/32  brain]
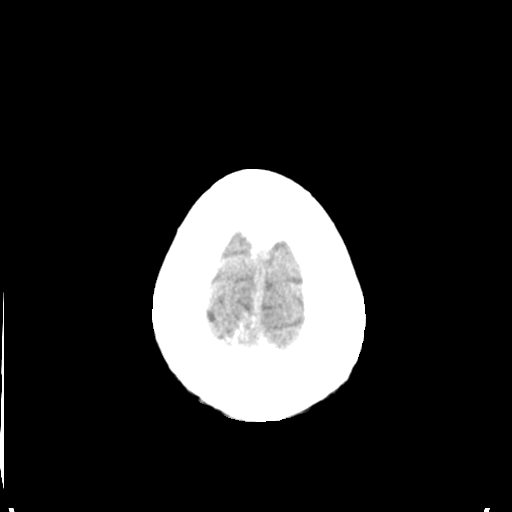

[Series 4: head bone · axial · 0.45mm/px · z∈[-70,-38]mm · 3 of 80 slices shown]
[im 8/80  bone]
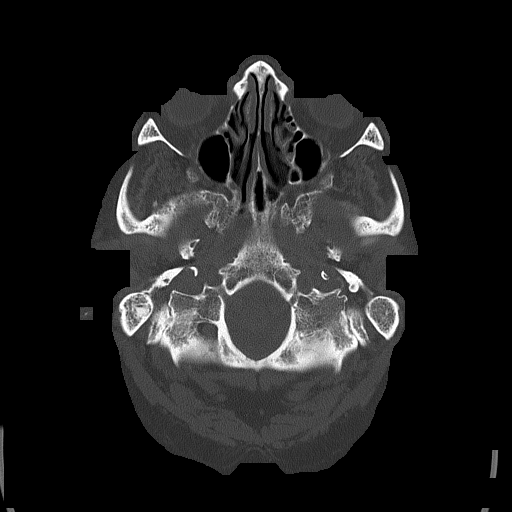
[im 16/80  bone]
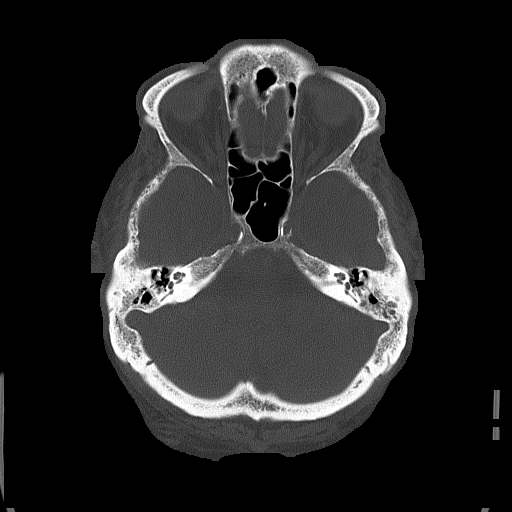
[im 24/80  bone]
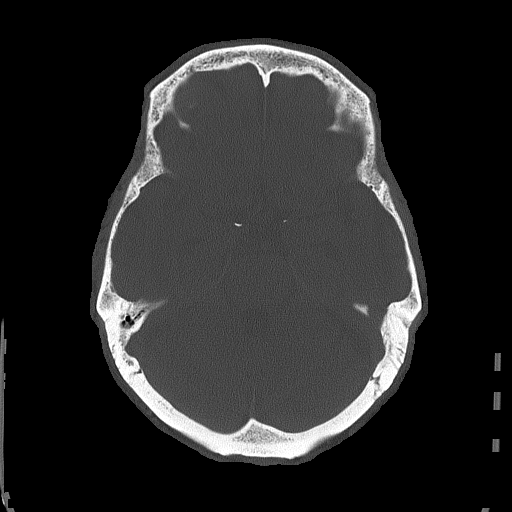

[Series 5: head without cor · coronal · non-contrast · 0.33mm/px · 3 of 74 slices shown]
[im 25/74  brain]
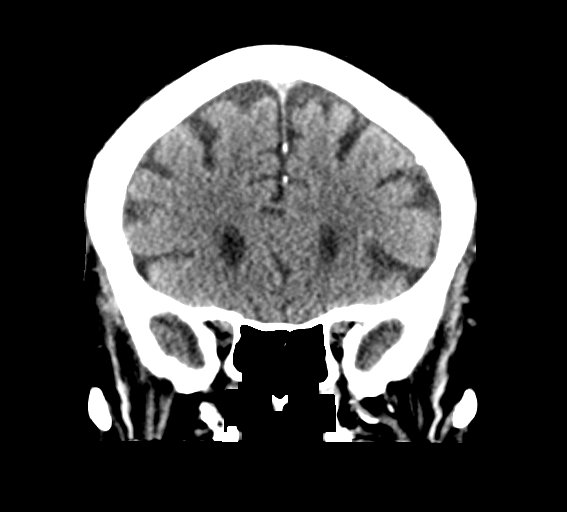
[im 33/74  brain]
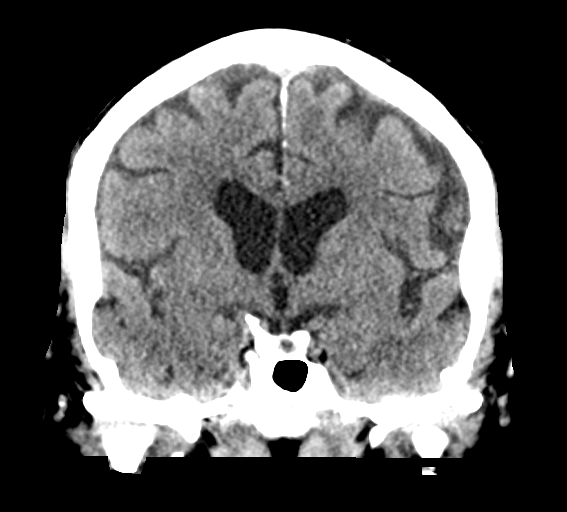
[im 41/74  brain]
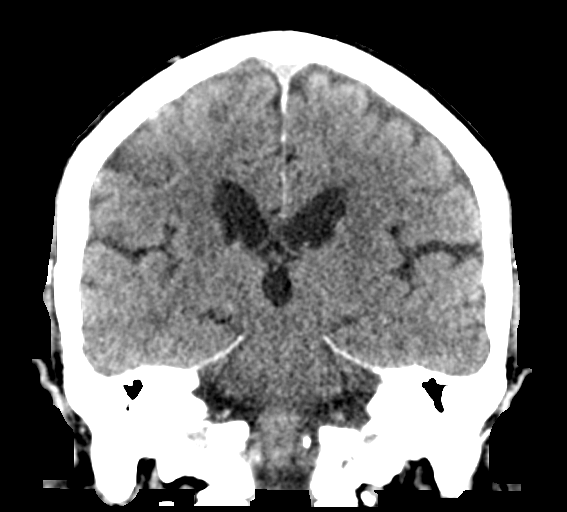

[Series 6: head without sag · sagittal · non-contrast · 0.33mm/px · 3 of 63 slices shown]
[im 21/63  brain]
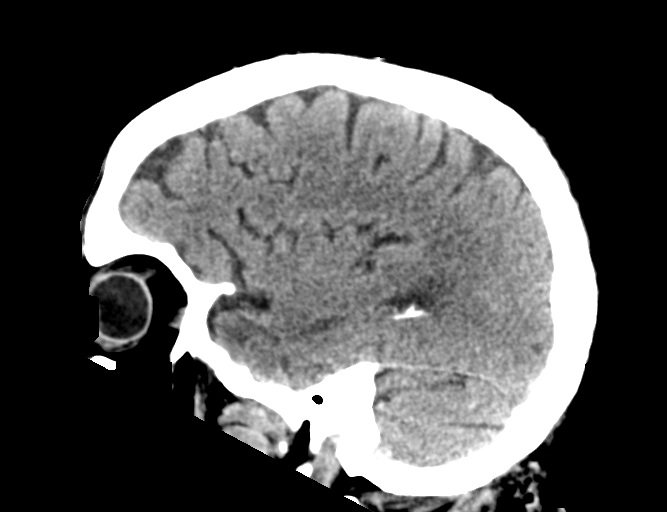
[im 32/63  brain]
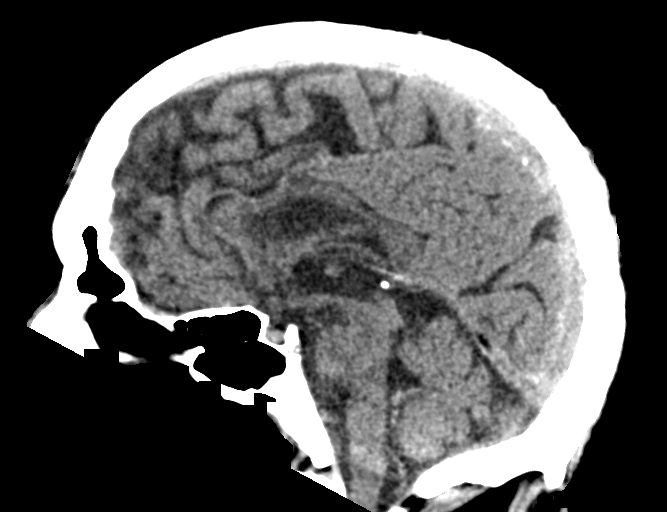
[im 42/63  brain]
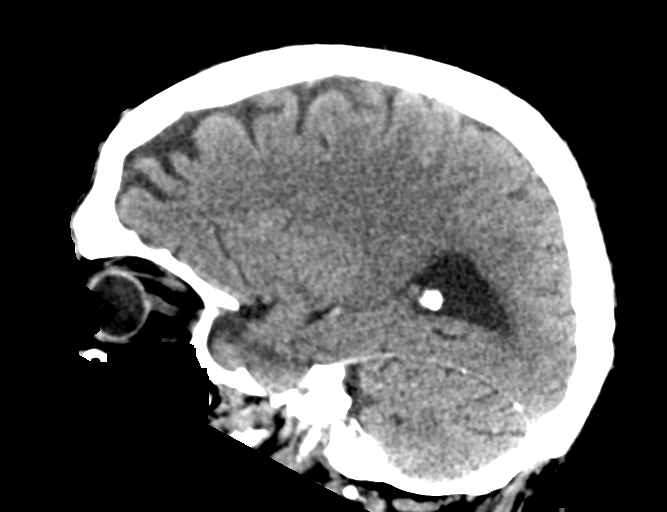

[16 of 47 positions shown; findings below may reference images not displayed]

FINDINGS: Brain: No evidence of acute infarction, hemorrhage, hydrocephalus,
extra-axial collection or mass lesion/mass effect. Mild cerebral
atrophy and chronic microvascular ischemic changes of the white
matter.

Vascular: No hyperdense vessel or unexpected calcification.

Skull: Normal. Negative for fracture or focal lesion.

Sinuses/Orbits: No acute finding. Globes are symmetric in size. No
retro-orbital swelling or asymmetry of the optic nerves.

Other: None.
IMPRESSION: 1. No acute intracranial abnormality. Mild cerebral atrophy and
chronic microvascular ischemic changes of the white matter.

2. Orbits are normal in size and symmetric. No retro-orbital fat
stranding or appreciable abnormality of the optic nerve. MRI
examination could be considered for further evaluation if clinically
necessary.

## 2022-11-24 ENCOUNTER — Ambulatory Visit: Payer: Medicare (Managed Care) | Admitting: Adult Health

## 2022-11-27 ENCOUNTER — Ambulatory Visit: Payer: Medicare (Managed Care) | Admitting: Adult Health

## 2022-12-22 ENCOUNTER — Ambulatory Visit (INDEPENDENT_AMBULATORY_CARE_PROVIDER_SITE_OTHER): Payer: Medicare (Managed Care) | Admitting: Adult Health

## 2022-12-22 ENCOUNTER — Encounter: Payer: Self-pay | Admitting: Adult Health

## 2022-12-22 DIAGNOSIS — G4700 Insomnia, unspecified: Secondary | ICD-10-CM | POA: Diagnosis not present

## 2022-12-22 MED ORDER — HYDROXYZINE HCL 25 MG PO TABS: ORAL_TABLET | ORAL | 2 refills | Status: AC

## 2022-12-22 NOTE — Progress Notes (Signed)
[facility] MD/PA/NP Initial Note  12/22/2022 2:44 PM Leslie [name]  MRN:  [id number]  Chief Complaint:   HPI:  Patient seen today for initial psychiatric evaluation.   Reviewed notes received from [facility].   Collateral to be reviewed.  Describes mood today as "ok". Pleasant. Cooperative. Denies tearfulness. Mood symptoms - denies depression, anxiety, and irritability. Denies worry, rumination, or over thinking. Mood is consistent. Stating "I take life as it comes". Feels like he is doing "ok" right now. Feels like he struggles with sleep and is willing to consider options to help. Stable interest and motivation. Taking medications as prescribed.  Energy levels good. Active, has a regular exercise routine - weight lifting. Enjoys some usual interests and activities. Married. Wife deceased. No children. Has a few friends. Has lived in [place]. Sent to foster care when she he was 8. Has 2 siblings living. Feeding cats daily.  Appetite adequate. Weight gain. Reports sleep issues - mind racing at times. Averages 5 to 6 hours. Focus and concentration stable. Completing tasks. Managing aspects of household. Disabled.  Denies SI or HI.  Denies AH or VH. Denies self harm. Consumes alcohol some days.  Previous medication trials: Multiple medication trials.Trazadone, Ambien  Visit Diagnosis:    ICD-10-CM   1. Insomnia, unspecified type  G47.00 hydrOXYzine (ATARAX) 25 MG tablet      Past Psychiatric History: Denies psychiatric hospitalization.   Past Medical History:  Past Medical History:  Diagnosis Date   Anxiety    Panic attack - 10  years aago- ? panic    Asthma    Cellulitis of left foot 2016   Healed and recurred 01/2016   Cellulitis of right foot 06/30/2012   Chronic lower back pain 07/01/2012   COPD (chronic obstructive pulmonary disease) ([place])    Uses nebulizers at home. Former smoker, current chewing tobacco. 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   GERD (gastroesophageal reflux  disease)    Gouty arthritis 07/01/2012   Not attacks in ~5 yrs (03/2016)   Hepatitis 1967   "in jail when I got it; dr said it was pretty bad; quaranteened X 30d"   Hyperlipidemia    Hypertension    Lung nodule, solitary 07/2015   Due for 1-yr recheck in 07/2017   Peripheral arterial disease ([place])    critical limb ischemia   Peripheral neuropathy 07/01/2012   Seasonal allergies    Skin growth 07/01/2012   right side of nares; "been on there 20 years"   Substance abuse ([place])    Type II diabetes mellitus ([place]) 2002   Diagnosed in 2002    Past Surgical History:  Procedure Laterality Date   AMPUTATION Left 03/28/2016   Procedure: AMPUTATION left foot 4th and 5 th ray;  Surgeon: Newt Minion, MD;  Location: [place];  Service: Orthopedics;  Laterality: Left;   AMPUTATION Left 04/11/2016   Procedure: LEFT BELOW KNEE AMPUTATION;  Surgeon: Newt Minion, MD;  Location: [place];  Service: Orthopedics;  Laterality: Left;   AMPUTATION Right 01/22/2018   Procedure: RIGHT FOOT 3RD RAY AMPUTATION;  Surgeon: Newt Minion, MD;  Location: [place];  Service: Orthopedics;  Laterality: Right;   AMPUTATION Right 02/19/2018   Procedure: RIGHT TRANSMETATARSAL AMPUTATION;  Surgeon: Newt Minion, MD;  Location: [place];  Service: Orthopedics;  Laterality: Right;   AMPUTATION Right 03/28/2018   Procedure: RIGHT BELOW KNEE AMPUTATION;  Surgeon: Newt Minion, MD;  Location: [place];  Service: Orthopedics;  Laterality: Right;  AMPUTATION Left 09/11/2021   Procedure: LEFT ABOVE KNEE AMPUTATION;  Surgeon: Newt Minion, MD;  Location: [place];  Service: Orthopedics;  Laterality: Left;   BIOPSY  08/25/2021   Procedure: BIOPSY;  Surgeon: Daryel November, MD;  Location[facility] ENDOSCOPY;  Service: Gastroenterology;;   ESOPHAGOGASTRODUODENOSCOPY Left 09/05/2021   Procedure: ESOPHAGOGASTRODUODENOSCOPY (EGD);  Surgeon: Lavena Bullion, DO;  Location: [facility] ENDOSCOPY;  Service: Gastroenterology;  Laterality: Left;    ESOPHAGOGASTRODUODENOSCOPY (EGD) WITH PROPOFOL N/A 08/25/2021   Procedure: ESOPHAGOGASTRODUODENOSCOPY (EGD) WITH PROPOFOL;  Surgeon: Daryel November, MD;  Location: [place];  Service: Gastroenterology;  Laterality: N/A;   HEMOSTASIS CLIP PLACEMENT  09/05/2021   Procedure: HEMOSTASIS CLIP PLACEMENT;  Surgeon: Lavena Bullion, DO;  Location: [place];  Service: Gastroenterology;;   HOT HEMOSTASIS  08/25/2021   Procedure: HOT HEMOSTASIS (ARGON PLASMA COAGULATION/BICAP);  Surgeon: Daryel November, MD;  Location[facility] ENDOSCOPY;  Service: Gastroenterology;;   HOT HEMOSTASIS N/A 09/05/2021   Procedure: HOT HEMOSTASIS (ARGON PLASMA COAGULATION/BICAP);  Surgeon: Lavena Bullion, DO;  Location: [facility] ENDOSCOPY;  Service: Gastroenterology;  Laterality: N/A;   I & D EXTREMITY Left 08/19/2021   Procedure: IRRIGATION AND DEBRIDEMENT LEFT KNEE;  Surgeon: Paralee Cancel, MD;  Location: [facility];  Service: Orthopedics;  Laterality: Left;   I & D EXTREMITY Left 08/29/2021   Procedure: IRRIGATION AND DEBRIDEMENT Left Knee;  Surgeon: Paralee Cancel, MD;  Location: [place];  Service: Orthopedics;  Laterality: Left;   LEG AMPUTATION BELOW KNEE Left 04/11/2016   LOWER EXTREMITY ANGIOGRAM N/A 04/18/2013   Procedure: LOWER EXTREMITY ANGIOGRAM;  Surgeon: Sherren Mocha, MD;  Location: [facility] CATH LAB;  Service: Cardiovascular;  Laterality: N/A;   LOWER EXTREMITY ANGIOGRAM  08/13/2015   bilateral iliac    PERIPHERAL VASCULAR CATHETERIZATION Bilateral 08/13/2015   Procedure: Lower Extremity Angiography;  Surgeon: Lorretta Harp, MD;  Location: [place] CV LAB;  Service: Cardiovascular;  Laterality: Bilateral;   PERIPHERAL VASCULAR CATHETERIZATION N/A 08/13/2015   Procedure: Abdominal Aortogram;  Surgeon: Lorretta Harp, MD;  Location: [place] CV LAB;  Service: Cardiovascular;  Laterality: N/A;   SCLEROTHERAPY  08/25/2021   Procedure: SCLEROTHERAPY;  Surgeon: Daryel November, MD;  Location: [facility]  ENDOSCOPY;  Service: Gastroenterology;;   Clide Deutscher  09/05/2021   Procedure: Clide Deutscher;  Surgeon: Lavena Bullion, DO;  Location: [place] ENDOSCOPY;  Service: Gastroenterology;;   TOE AMPUTATION Right 01/22/2018   3RD TOE RIGHT FOOT    Family Psychiatric History: Denies any family history of mental illness.   Family History:  Family History  Problem Relation Age of Onset   Diabetes Sister    Hypertension Mother     Social History:  Social History   Socioeconomic History   Marital status: Widowed    Spouse name: Not on file   Number of children: Not on file   Years of education: Not on file   Highest education level: Not on file  Occupational History   Occupation: disabled  Tobacco Use   Smoking status: Former    Packs/day: 2.00    Years: 40.00    Total pack years: 80.00    Types: Cigarettes    Quit date: 01/13/2002    Years since quitting: 20.9   Smokeless tobacco: Current    Types: Chew   Tobacco comments:    chews a little tobacco  Vaping Use   Vaping Use: Never used  Substance and Sexual Activity   Alcohol use: Not Currently    Comment: 03/03/18: "Used  to drink alot. 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   Lives in assisted living. Performs ADLs and IADLs, including cooking and managing his medications. Friend drives him to the store. Uses rollator walker at home and motorized scooter in store.   [facility] patient   Widowed   5-8 years education   Likes to fish   Pets:  4 cockatiels and 2 lovebirds   Social Determinants of Radio broadcast assistant Strain: Not on file  Food Insecurity: Not on file  Transportation Needs: Not on file  Physical Activity: Not on file  Stress: Not on file  Social  Connections: Not on file    Allergies:  Allergies  Allergen Reactions   Atorvastatin Other (See Comments)    Unknown reaction   Statins Other (See Comments)    Unknown reaction - on MAR   Flexeril [Cyclobenzaprine] Other (See Comments)    Dry mouth and hallucinations    Metabolic Disorder Labs: Lab Results  Component Value Date   HGBA1C 5.8 (H) 08/19/2021   MPG 119.76 08/19/2021   MPG 157.07 02/19/2018   No results found for: "PROLACTIN" Lab Results  Component Value Date   CHOL 133 08/19/2021   TRIG 208 (H) 08/19/2021   HDL <10 (L) 08/19/2021   CHOLHDL NOT CALCULATED 08/19/2021   VLDL 42 (H) 08/19/2021   LDLCALC NOT CALCULATED 08/19/2021   LDLCALC 91 05/30/2009   Lab Results  Component Value Date   TSH 1.371 08/19/2021   TSH 1.345 08/13/2015    Therapeutic Level Labs: No results found for: "LITHIUM" No results found for: "VALPROATE" No results found for: "CBMZ"  Current Medications: Current Outpatient Medications  Medication Sig Dispense Refill   hydrOXYzine (ATARAX) 25 MG tablet Take 1 to 2 tablets at bedtime for sleep. 60 tablet 2   acetaminophen (TYLENOL) 325 MG tablet Take 2 tablets (650 mg total) by mouth every 6 (six) hours as needed for mild pain (or Fever >/= 101). (Patient taking differently: Take 650 mg by mouth every 8 (eight) hours as needed for mild pain (or Fever >/= 101).) 30 tablet 0   albuterol (PROVENTIL HFA;VENTOLIN HFA) 108 (90 Base) MCG/ACT inhaler Inhale 2 puffs into the lungs 4 (four) times daily as needed for wheezing or shortness of breath.      albuterol (PROVENTIL) (2.5 MG/3ML) 0.083% nebulizer solution Take 2.5 mg by nebulization every 6 (six) hours as needed for wheezing or shortness of breath.     allopurinol (ZYLOPRIM) 100 MG tablet Take 100 mg by mouth at bedtime. For gout     AMINO ACIDS-PROTEIN HYDROLYS PO Take 1 Dose by mouth 2 (two) times daily. Add SF Pro-Stat     amLODipine (NORVASC) 10 MG tablet Take 1 tablet (10 mg total) by  mouth daily. 90 tablet 3   ascorbic acid (VITAMIN C) 1000 MG tablet Take 1 tablet (1,000 mg total) by mouth daily.     aspirin 81 MG chewable tablet Chew 81 mg by mouth daily.     calcium carbonate (TUMS EX) 750 MG chewable tablet Chew 1 tablet by mouth 2 (two) times daily as needed for heartburn.     Cholecalciferol (VITAMIN D-3) 1000 units CAPS Take  1,000 Units by mouth daily.     escitalopram (LEXAPRO) 10 MG tablet Take 10 mg by mouth daily.     Evolocumab with Infusor (Granite) 420 MG/3.5ML SOCT Inject 420 mg into the skin every 30 (thirty) days.     ferrous sulfate 325 (65 FE) MG tablet Take 325 mg by mouth every other day.     finasteride (PROSCAR) 5 MG tablet Take 1 tablet by mouth daily. 90 tablet 3   folic acid (FOLVITE) 1 MG tablet Take 1 tablet (1 mg total) by mouth daily.     insulin aspart (NOVOLOG) 100 UNIT/ML injection Inject 0-9 Units into the skin 3 (three) times daily with meals. 10 mL 11   Melatonin 10 MG TABS Take 10 mg by mouth at bedtime.     metFORMIN (GLUCOPHAGE) 850 MG tablet Take 850 mg by mouth 2 (two) times daily with a meal.     Multiple Vitamins-Minerals (CERTAVITE SENIOR/ANTIOXIDANT) TABS Take 1 tablet by mouth daily.     nitroGLYCERIN (NITROSTAT) 0.4 MG SL tablet Place 0.4 mg under the tongue every 5 (five) minutes x 3 doses as needed for chest pain.      oxyCODONE (OXY IR/ROXICODONE) 5 MG immediate release tablet Take 1-2 tablets (5-10 mg total) by mouth every 4 (four) hours as needed for moderate pain (pain score 4-6). 30 tablet 0   oxyCODONE 10 MG TABS Take 1-1.5 tablets (10-15 mg total) by mouth every 4 (four) hours as needed for severe pain (pain score 7-10). 30 tablet 0   pantoprazole (PROTONIX) 40 MG tablet Take 1 tablet (40 mg total) by mouth 2 (two) times daily. 60 tablet 1   pregabalin (LYRICA) 75 MG capsule Take 1 capsule (75 mg total) by mouth 2 (two) times daily. 60 capsule 0   pregabalin (LYRICA) 75 MG capsule Take 1 capsule (75 mg  total) by mouth 2 (two) times daily. 60 capsule 0   senna-docusate (SENOKOT-S) 8.6-50 MG tablet Take 2 tablets by mouth daily.     Skin Protectants, Misc. (MINERIN) CREA Apply 1 application topically 2 (two) times daily as needed (dryness).     sucralfate (CARAFATE) 1 g tablet Take 1 tablet (1 g total) by mouth 4 (four) times daily -  with meals and at bedtime.     tamsulosin (FLOMAX) 0.4 MG CAPS capsule Take 1 capsule by mouth at bedtime 30 capsule 5   traZODone (DESYREL) 50 MG tablet Take 50 mg by mouth at bedtime.     vitamin B-12 (CYANOCOBALAMIN) 1000 MCG tablet Take 1,000 mcg by mouth daily.     No current facility-administered medications for this visit.    Medication Side Effects: none  Orders placed this visit:  No orders of the defined types were placed in this encounter.   Psychiatric Specialty Exam:  Review of Systems  Musculoskeletal:  Negative for gait problem.  Neurological:  Negative for tremors.  Psychiatric/Behavioral:         Please refer to HPI    There were no vitals taken for this visit.There is no height or weight on file to calculate BMI.  General Appearance: Casual and Neat  Eye Contact:  Good  Speech:  Clear and Coherent and Normal Rate  Volume:  Normal  Mood:  Euthymic  Affect:  Appropriate and Congruent  Thought Process:  Coherent and Descriptions of Associations: Intact  Orientation:  Full (Time, Place, and Person)  Thought Content: Logical   Suicidal Thoughts:  No  Homicidal Thoughts:  No  Memory:  WNL  Judgement:  Good  Insight:  Good  Psychomotor Activity:  Normal  Concentration:  Concentration: Good and Attention Span: Good  Recall:  Good  Fund of Knowledge: Good  Language: Good  Assets:  Communication Skills Desire for Improvement Financial Resources/Insurance Housing Intimacy Leisure Time Physical Health Resilience Social Support Talents/Skills Transportation Vocational/Educational  ADL's:  Intact  Cognition: WNL  Prognosis:   Good   Screenings:  PHQ2-9    [facility] Office Visit from 01/16/2012 in [place]  PHQ-2 Total Score [place] ED from 07/21/2022 in [facility] Emergency Department at [facility] ED from 07/03/2022 in [facility] Emergency Department at [facility] ED from 06/14/2022 in [facility] Department at [place] No Risk No Risk No Risk       Receiving Psychotherapy: No   Treatment Plan/Recommendations:   Plan:  PDMP reviewed  Add Hydroxyzine '25mg'$  - take 1 to 2 tablets at bedtime as needed for sleep.  Consider Seroquel   Patient seen for 30 minutes and time spent discussing treatment options.  RTC 4 weeks  Patient advised to contact office with any questions, adverse effects, or acute worsening in signs and symptoms.      Aloha Gell, NP

## 2023-01-19 ENCOUNTER — Ambulatory Visit: Payer: Medicare (Managed Care) | Admitting: Adult Health

## 2023-01-19 ENCOUNTER — Encounter: Payer: Self-pay | Admitting: Adult Health

## 2023-01-19 DIAGNOSIS — F489 Nonpsychotic mental disorder, unspecified: Secondary | ICD-10-CM

## 2023-01-19 DIAGNOSIS — G4700 Insomnia, unspecified: Secondary | ICD-10-CM | POA: Diagnosis not present

## 2023-01-19 NOTE — Progress Notes (Addendum)
Leslie [name] JC:[id number] [birth year] 77 y.o.  Subjective:   Patient ID:  Leslie [name] is a 77 y.o. (DOB 09/01/1946) male.  Chief Complaint: No chief complaint on file.   HPI Leslie [name] presents to the office today for follow-up of insomnia.  Describes mood today as "ok". Pleasant. Cooperative. Denies tearfulness. Mood symptoms - denies depression, anxiety, and irritability. Denies worry, rumination, or over thinking. Mood is consistent. Stating "I'm doing alright". Feels like the addition of Hydroxyzine has been helpful. Stable interest and motivation. Taking medications as prescribed.  Energy levels good. Active, has a regular exercise routine - weight lifting. Enjoys some usual interests and activities. Married. Wife deceased. No children. Has a few friends. Has lived in [place]. Sent to foster care when she he was 8. Has 2 siblings living. Feeding cats daily.  Appetite adequate. Weight gain - 193 pounds. Reports sleep issues - mind racing at times. Averages 10 to 12 hours. Focus and concentration stable. Completing tasks. Managing aspects of household. Disabled.  Denies SI or HI.  Denies AH or VH. Denies self harm. Consumes alcohol some days.  Previous medication trials: Multiple medication trials.Trazadone, Ambien  PHQ2-9    Flowsheet Row Office Visit from 01/16/2012 in [place]  PHQ-2 Total Score [place] ED from 07/21/2022 in [facility] Emergency Department at [facility] ED from 07/03/2022 in [facility] Emergency Department at [facility] ED from 06/14/2022 in [facility] Emergency Department at [place] No Risk No Risk No Risk        Review of Systems:  Review of Systems  Musculoskeletal:  Negative for gait problem.  Neurological:  Negative for tremors.  Psychiatric/Behavioral:         Please refer to HPI    Medications: I have reviewed the patient's current  medications.  Current Outpatient Medications  Medication Sig Dispense Refill   acetaminophen (TYLENOL) 325 MG tablet Take 2 tablets (650 mg total) by mouth every 6 (six) hours as needed for mild pain (or Fever >/= 101). (Patient taking differently: Take 650 mg by mouth every 8 (eight) hours as needed for mild pain (or Fever >/= 101).) 30 tablet 0   albuterol (PROVENTIL HFA;VENTOLIN HFA) 108 (90 Base) MCG/ACT inhaler Inhale 2 puffs into the lungs 4 (four) times daily as needed for wheezing or shortness of breath.      albuterol (PROVENTIL) (2.5 MG/3ML) 0.083% nebulizer solution Take 2.5 mg by nebulization every 6 (six) hours as needed for wheezing or shortness of breath.     allopurinol (ZYLOPRIM) 100 MG tablet Take 100 mg by mouth at bedtime. For gout     AMINO ACIDS-PROTEIN HYDROLYS PO Take 1 Dose by mouth 2 (two) times daily. Add SF Pro-Stat     amLODipine (NORVASC) 10 MG tablet Take 1 tablet (10 mg total) by mouth daily. 90 tablet 3   ascorbic acid (VITAMIN C) 1000 MG tablet Take 1 tablet (1,000 mg total) by mouth daily.     aspirin 81 MG chewable tablet Chew 81 mg by mouth daily.     calcium carbonate (TUMS EX) 750 MG chewable tablet Chew 1 tablet by mouth 2 (two) times daily as needed for heartburn.     Cholecalciferol (VITAMIN D-3) 1000 units CAPS Take 1,000 Units by mouth daily.     escitalopram (LEXAPRO) 10 MG tablet Take 10 mg by mouth daily.     Evolocumab with  Infusor (REPATHA PUSHTRONEX SYSTEM) 420 MG/3.5ML SOCT Inject 420 mg into the skin every 30 (thirty) days.     ferrous sulfate 325 (65 FE) MG tablet Take 325 mg by mouth every other day.     finasteride (PROSCAR) 5 MG tablet Take 1 tablet by mouth daily. 90 tablet 3   folic acid (FOLVITE) 1 MG tablet Take 1 tablet (1 mg total) by mouth daily.     hydrOXYzine (ATARAX) 25 MG tablet Take 1 to 2 tablets at bedtime for sleep. 60 tablet 2   insulin aspart (NOVOLOG) 100 UNIT/ML injection Inject 0-9 Units into the skin 3 (three) times  daily with meals. 10 mL 11   Melatonin 10 MG TABS Take 10 mg by mouth at bedtime.     metFORMIN (GLUCOPHAGE) 850 MG tablet Take 850 mg by mouth 2 (two) times daily with a meal.     Multiple Vitamins-Minerals (CERTAVITE SENIOR/ANTIOXIDANT) TABS Take 1 tablet by mouth daily.     nitroGLYCERIN (NITROSTAT) 0.4 MG SL tablet Place 0.4 mg under the tongue every 5 (five) minutes x 3 doses as needed for chest pain.      oxyCODONE (OXY IR/ROXICODONE) 5 MG immediate release tablet Take 1-2 tablets (5-10 mg total) by mouth every 4 (four) hours as needed for moderate pain (pain score 4-6). 30 tablet 0   oxyCODONE 10 MG TABS Take 1-1.5 tablets (10-15 mg total) by mouth every 4 (four) hours as needed for severe pain (pain score 7-10). 30 tablet 0   pantoprazole (PROTONIX) 40 MG tablet Take 1 tablet (40 mg total) by mouth 2 (two) times daily. 60 tablet 1   pregabalin (LYRICA) 75 MG capsule Take 1 capsule (75 mg total) by mouth 2 (two) times daily. 60 capsule 0   pregabalin (LYRICA) 75 MG capsule Take 1 capsule (75 mg total) by mouth 2 (two) times daily. 60 capsule 0   senna-docusate (SENOKOT-S) 8.6-50 MG tablet Take 2 tablets by mouth daily.     Skin Protectants, Misc. (MINERIN) CREA Apply 1 application topically 2 (two) times daily as needed (dryness).     sucralfate (CARAFATE) 1 g tablet Take 1 tablet (1 g total) by mouth 4 (four) times daily -  with meals and at bedtime.     tamsulosin (FLOMAX) 0.4 MG CAPS capsule Take 1 capsule by mouth at bedtime 30 capsule 5   traZODone (DESYREL) 50 MG tablet Take 50 mg by mouth at bedtime.     vitamin B-12 (CYANOCOBALAMIN) 1000 MCG tablet Take 1,000 mcg by mouth daily.     No current facility-administered medications for this visit.    Medication Side Effects: None  Allergies:  Allergies  Allergen Reactions   Atorvastatin Other (See Comments)    Unknown reaction   Statins Other (See Comments)    Unknown reaction - on MAR   Flexeril [Cyclobenzaprine] Other (See  Comments)    Dry mouth and hallucinations    Past Medical History:  Diagnosis Date   Anxiety    Panic attack - 10  years aago- ? panic    Asthma    Cellulitis of left foot 2016   Healed and recurred 01/2016   Cellulitis of right foot 06/30/2012   Chronic lower back pain 07/01/2012   COPD (chronic obstructive pulmonary disease) ([place])    Uses nebulizers at home. Former smoker, current chewing tobacco. 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   GERD (gastroesophageal reflux disease)    Gouty arthritis 07/01/2012   Not attacks in ~5 yrs (03/2016)  Hepatitis 1967   "in jail when I got it; dr said it was pretty bad; quaranteened X 30d"   Hyperlipidemia    Hypertension    Lung nodule, solitary 07/2015   Due for 1-yr recheck in 07/2017   Peripheral arterial disease ([place])    critical limb ischemia   Peripheral neuropathy 07/01/2012   Seasonal allergies    Skin growth 07/01/2012   right side of nares; "been on there 20 years"   Substance abuse ([place])    Type II diabetes mellitus ([place]) 2002   Diagnosed in 2002    Past Medical History, Surgical history, Social history, and Family history were reviewed and updated as appropriate.   Please see review of systems for further details on the patient's review from today.   Objective:   Physical Exam:  There were no vitals taken for this visit.  Physical Exam Constitutional:      General: He is not in acute distress. Musculoskeletal:        General: No deformity.  Neurological:     Mental Status: He is alert and oriented to person, place, and time.     Coordination: Coordination normal.  Psychiatric:        Attention and Perception: Attention and perception normal. He does not perceive auditory or visual hallucinations.        Mood and Affect: Mood normal. Mood is not anxious or depressed. Affect is not labile, blunt, angry or inappropriate.        Speech: Speech normal.        Behavior: Behavior normal.        Thought Content: Thought content normal. Thought  content is not paranoid or delusional. Thought content does not include homicidal or suicidal ideation. Thought content does not include homicidal or suicidal plan.        Cognition and Memory: Cognition and memory normal.        Judgment: Judgment normal.     Comments: Insight intact     Lab Review:     Component Value Date/Time   NA 136 03/10/2022 1641   K 4.0 03/10/2022 1641   CL 102 03/10/2022 1641   CO2 26 03/10/2022 1641   GLUCOSE 157 (H) 03/10/2022 1641   BUN 19 03/10/2022 1641   CREATININE 1.60 (H) 03/10/2022 1641   CREATININE 0.73 08/09/2012 0912   CALCIUM 8.9 03/10/2022 1641   PROT 7.4 03/10/2022 1641   ALBUMIN 3.6 03/10/2022 1641   AST 18 03/10/2022 1641   ALT 15 03/10/2022 1641   ALKPHOS 60 03/10/2022 1641   BILITOT 0.6 03/10/2022 1641   GFRNONAA 44 (L) 03/10/2022 1641   GFRAA >60 03/30/2018 0346       Component Value Date/Time   WBC 6.5 03/10/2022 1641   RBC 3.67 (L) 03/10/2022 1641   HGB 9.8 (L) 03/10/2022 1641   HCT 30.9 (L) 03/10/2022 1641   PLT 159 03/10/2022 1641   MCV 84.2 03/10/2022 1641   MCH 26.7 03/10/2022 1641   MCHC 31.7 03/10/2022 1641   RDW 15.6 (H) 03/10/2022 1641   LYMPHSABS 2.4 03/10/2022 1641   MONOABS 0.6 03/10/2022 1641   EOSABS 0.1 03/10/2022 1641   BASOSABS 0.0 03/10/2022 1641    No results found for: "POCLITH", "LITHIUM"   No results found for: "PHENYTOIN", "PHENOBARB", "VALPROATE", "CBMZ"   .res Assessment: Plan:     Plan:  PDMP reviewed  Continue Hydroxyzine 25mg  - take 1 to 2 tablets at bedtime as needed for sleep.  Consider Seroquel  Patient seen for 30 minutes and time spent discussing treatment options.  RTC 4 weeks  Patient advised to contact office with any questions, adverse effects, or acute worsening in signs and symptoms.  Diagnoses and all orders for this visit:  Insomnia, unspecified type    Please see After Visit Summary for patient specific instructions.  Future Appointments  Date Time  Provider Department Center  02/17/2023 11:20 AM Tyrick Dunagan, [name], NP [facility] None    No orders of the defined types were placed in this encounter.   -------------------------------

## 2023-01-19 NOTE — Addendum Note (Signed)
Addended by: Aloha Gell on: 01/19/2023 01:44 PM   Modules accepted: Level of Service

## 2023-02-04 ENCOUNTER — Telehealth: Payer: Self-pay | Admitting: Adult Health

## 2023-02-04 NOTE — Telephone Encounter (Signed)
Dr Renato Gails at [facility] @ 1:33p.  She said she was hoping to speak to [facility] regarding the pt's care.  Her cell number is [phone]  Next appt 4/23

## 2023-02-04 NOTE — Telephone Encounter (Signed)
Called and LVM to return call.

## 2023-02-04 NOTE — Telephone Encounter (Signed)
Please see message. Patient has only been seen once, was a no show last visit. F/U 4/23.

## 2023-02-04 NOTE — Telephone Encounter (Signed)
She called back at 2:35p.  Said she was on the phone with IT and missed the call.  Pls call her back.

## 2023-02-17 ENCOUNTER — Ambulatory Visit: Payer: Medicare (Managed Care) | Admitting: Adult Health

## 2023-02-17 NOTE — Progress Notes (Signed)
Patient no show appointment. ? ?

## 2023-03-06 ENCOUNTER — Other Ambulatory Visit: Payer: Self-pay | Admitting: Internal Medicine

## 2023-03-06 DIAGNOSIS — H5440 Blindness, one eye, unspecified eye: Secondary | ICD-10-CM

## 2023-04-07 ENCOUNTER — Other Ambulatory Visit: Payer: Medicare (Managed Care)

## 2023-04-26 ENCOUNTER — Emergency Department (HOSPITAL_COMMUNITY)
Admission: EM | Admit: 2023-04-26 | Discharge: 2023-04-26 | Disposition: A | Discharge status: Home / Self Care | Payer: Medicare (Managed Care) | Attending: Emergency Medicine | Admitting: Emergency Medicine

## 2023-04-26 DIAGNOSIS — J45909 Unspecified asthma, uncomplicated: Secondary | ICD-10-CM | POA: Insufficient documentation

## 2023-04-26 DIAGNOSIS — Z794 Long term (current) use of insulin: Secondary | ICD-10-CM | POA: Diagnosis not present

## 2023-04-26 DIAGNOSIS — T83511A Infection and inflammatory reaction due to indwelling urethral catheter, initial encounter: Secondary | ICD-10-CM | POA: Insufficient documentation

## 2023-04-26 DIAGNOSIS — Z7984 Long term (current) use of oral hypoglycemic drugs: Secondary | ICD-10-CM | POA: Insufficient documentation

## 2023-04-26 DIAGNOSIS — E119 Type 2 diabetes mellitus without complications: Secondary | ICD-10-CM | POA: Diagnosis not present

## 2023-04-26 DIAGNOSIS — J449 Chronic obstructive pulmonary disease, unspecified: Secondary | ICD-10-CM | POA: Diagnosis not present

## 2023-04-26 DIAGNOSIS — Z7982 Long term (current) use of aspirin: Secondary | ICD-10-CM | POA: Diagnosis not present

## 2023-04-26 DIAGNOSIS — R339 Retention of urine, unspecified: Secondary | ICD-10-CM | POA: Diagnosis present

## 2023-04-26 DIAGNOSIS — N390 Urinary tract infection, site not specified: Secondary | ICD-10-CM

## 2023-04-26 DIAGNOSIS — Z79899 Other long term (current) drug therapy: Secondary | ICD-10-CM | POA: Diagnosis not present

## 2023-04-26 DIAGNOSIS — I10 Essential (primary) hypertension: Secondary | ICD-10-CM | POA: Diagnosis not present

## 2023-04-26 DIAGNOSIS — Y732 Prosthetic and other implants, materials and accessory gastroenterology and urology devices associated with adverse incidents: Secondary | ICD-10-CM | POA: Diagnosis not present

## 2023-04-26 DIAGNOSIS — T83091A Other mechanical complication of indwelling urethral catheter, initial encounter: Secondary | ICD-10-CM | POA: Diagnosis not present

## 2023-04-26 LAB — BASIC METABOLIC PANEL
Anion gap: 8 (ref 5–15)
BUN: 19 mg/dL (ref 8–23)
CO2: 23 mmol/L (ref 22–32)
Calcium: 7.9 mg/dL — ABNORMAL LOW (ref 8.9–10.3)
Chloride: 102 mmol/L (ref 98–111)
Creatinine, Ser: 1.35 mg/dL — ABNORMAL HIGH (ref 0.61–1.24)
GFR, Estimated: 54 mL/min — ABNORMAL LOW (ref >60)
Glucose, Bld: 147 mg/dL — ABNORMAL HIGH (ref 70–99)
Potassium: 3.6 mmol/L (ref 3.5–5.1)
Sodium: 133 mmol/L — ABNORMAL LOW (ref 135–145)

## 2023-04-26 LAB — URINALYSIS, ROUTINE W REFLEX MICROSCOPIC
Bilirubin Urine: NEGATIVE
Glucose, UA: NEGATIVE mg/dL
Ketones, ur: NEGATIVE mg/dL
Nitrite: NEGATIVE
Protein, ur: 30 mg/dL — AB
Specific Gravity, Urine: 1.002 — ABNORMAL LOW (ref 1.005–1.030)
WBC, UA: >50 WBC/hpf (ref 0–5)
pH: 6 (ref 5.0–8.0)

## 2023-04-26 LAB — CBC WITH DIFFERENTIAL/PLATELET
Abs Immature Granulocytes: 0.02 10*3/uL (ref 0.00–0.07)
Basophils Absolute: 0 10*3/uL (ref 0.0–0.1)
Basophils Relative: 0 %
Eosinophils Absolute: 0.1 10*3/uL (ref 0.0–0.5)
Eosinophils Relative: 1 %
HCT: 39.7 % (ref 39.0–52.0)
Hemoglobin: 13.9 g/dL (ref 13.0–17.0)
Immature Granulocytes: 0 %
Lymphocytes Relative: 24 %
Lymphs Abs: 1.9 10*3/uL (ref 0.7–4.0)
MCH: 31.4 pg (ref 26.0–34.0)
MCHC: 35 g/dL (ref 30.0–36.0)
MCV: 89.6 fL (ref 80.0–100.0)
Monocytes Absolute: 0.7 10*3/uL (ref 0.1–1.0)
Monocytes Relative: 8 %
Neutro Abs: 5.4 10*3/uL (ref 1.7–7.7)
Neutrophils Relative %: 67 %
Platelets: 126 10*3/uL — ABNORMAL LOW (ref 150–400)
RBC: 4.43 MIL/uL (ref 4.22–5.81)
RDW: 13.5 % (ref 11.5–15.5)
WBC: 8 10*3/uL (ref 4.0–10.5)
nRBC: 0 % (ref 0.0–0.2)

## 2023-04-26 MED ORDER — SULFAMETHOXAZOLE-TRIMETHOPRIM 800-160 MG PO TABS
1.0000 | ORAL_TABLET | Freq: Once | ORAL | Status: AC
  Administered 2023-04-26: 1 via ORAL
  Filled 2023-04-26: qty 1

## 2023-04-26 MED ORDER — SULFAMETHOXAZOLE-TRIMETHOPRIM 800-160 MG PO TABS
1.0000 | ORAL_TABLET | Freq: Two times a day (BID) | ORAL | 0 refills | Status: AC
  Filled 2023-04-26 – 2023-04-27 (×2): qty 14, 7d supply, fill #0

## 2023-04-26 NOTE — ED Provider Notes (Signed)
[name] AT [facility] Provider Note   CSN: [id number] Arrival date & time: 04/26/23  1439     History  Chief Complaint  Patient presents with   Urinary Retention    Leslie [name] is a 77 y.o. male.  Pt is a 77 yo male with pmhx significant for asthma, GERD, HTN, HLD, DM2, COPD, right bka and left aka.  Pt has an indwelling foley and foley had not had any output since last night.  He had a lot of discomfort.  He called EMS and when they were loading him in the truck, it started draining urine and pain went away.  Pt said his foley has not been changed in a long time, but he is not sure when was the last time.       Home Medications Prior to Admission medications   Medication Sig Start Date End Date Taking? Authorizing Provider  sulfamethoxazole-trimethoprim (BACTRIM DS) 800-160 MG tablet Take 1 tablet by mouth 2 (two) times daily for 7 days. 04/26/23 05/03/23 Yes Jacalyn Lefevre, MD  acetaminophen (TYLENOL) 325 MG tablet Take 2 tablets (650 mg total) by mouth every 6 (six) hours as needed for mild pain (or Fever >/= 101). Patient taking differently: Take 650 mg by mouth every 8 (eight) hours as needed for mild pain (or Fever >/= 101). 03/30/18   Synetta Fail, MD  albuterol (PROVENTIL HFA;VENTOLIN HFA) 108 (90 Base) MCG/ACT inhaler Inhale 2 puffs into the lungs 4 (four) times daily as needed for wheezing or shortness of breath.     [provider]  albuterol (PROVENTIL) (2.5 MG/3ML) 0.083% nebulizer solution Take 2.5 mg by nebulization every 6 (six) hours as needed for wheezing or shortness of breath.    [provider]  allopurinol (ZYLOPRIM) 100 MG tablet Take 100 mg by mouth at bedtime. For gout    [provider]  AMINO ACIDS-PROTEIN HYDROLYS PO Take 1 Dose by mouth 2 (two) times daily. Add SF Pro-Stat    [provider]  amLODipine (NORVASC) 10 MG tablet Take 1 tablet (10 mg total) by mouth daily. 08/09/12    Funches, [name], MD  ascorbic acid (VITAMIN C) 1000 MG tablet Take 1 tablet (1,000 mg total) by mouth daily. 09/14/21   Doran Stabler, DO  aspirin 81 MG chewable tablet Chew 81 mg by mouth daily.    [provider]  calcium carbonate (TUMS EX) 750 MG chewable tablet Chew 1 tablet by mouth 2 (two) times daily as needed for heartburn.    [provider]  Cholecalciferol (VITAMIN D-3) 1000 units CAPS Take 1,000 Units by mouth daily.    [provider]  escitalopram (LEXAPRO) 10 MG tablet Take 10 mg by mouth daily.    [provider]  Evolocumab with Infusor (REPATHA PUSHTRONEX SYSTEM) 420 MG/3.5ML SOCT Inject 420 mg into the skin every 30 (thirty) days.    [provider]  ferrous sulfate 325 (65 FE) MG tablet Take 325 mg by mouth every other day.    [provider]  finasteride (PROSCAR) 5 MG tablet Take 1 tablet by mouth daily. 01/27/22     folic acid (FOLVITE) 1 MG tablet Take 1 tablet (1 mg total) by mouth daily. 09/14/21   Doran Stabler, DO  hydrOXYzine (ATARAX) 25 MG tablet Take 1 to 2 tablets at bedtime for sleep. 12/22/22   Mozingo, [name], NP  insulin aspart (NOVOLOG) 100 UNIT/ML injection Inject 0-9 Units into the skin 3 (  three) times daily with meals. 10/07/21   Demaio, Alexa, MD  Melatonin 10 MG TABS Take 10 mg by mouth at bedtime.    [provider]  metFORMIN (GLUCOPHAGE) 850 MG tablet Take 850 mg by mouth 2 (two) times daily with a meal.    [provider]  Multiple Vitamins-Minerals (CERTAVITE SENIOR/ANTIOXIDANT) TABS Take 1 tablet by mouth daily.    [provider]  nitroGLYCERIN (NITROSTAT) 0.4 MG SL tablet Place 0.4 mg under the tongue every 5 (five) minutes x 3 doses as needed for chest pain.  11/12/11   Funches, [name], MD  oxyCODONE (OXY IR/ROXICODONE) 5 MG immediate release tablet Take 1-2 tablets (5-10 mg total) by mouth every 4 (four) hours as needed for moderate pain (pain score 4-6).  09/13/21   Dellis Filbert, MD  oxyCODONE 10 MG TABS Take 1-1.5 tablets (10-15 mg total) by mouth every 4 (four) hours as needed for severe pain (pain score 7-10). 09/13/21   Dellis Filbert, MD  pantoprazole (PROTONIX) 40 MG tablet Take 1 tablet (40 mg total) by mouth 2 (two) times daily. 10/07/21 12/06/21  Demaio, [name], MD  pregabalin (LYRICA) 75 MG capsule Take 1 capsule (75 mg total) by mouth 2 (two) times daily. 10/24/21   Nadara Mustard, MD  pregabalin (LYRICA) 75 MG capsule Take 1 capsule (75 mg total) by mouth 2 (two) times daily. 10/24/21   Nadara Mustard, MD  senna-docusate (SENOKOT-S) 8.6-50 MG tablet Take 2 tablets by mouth daily.    [provider]  Skin Protectants, Misc. (MINERIN) CREA Apply 1 application topically 2 (two) times daily as needed (dryness).    [provider]  sucralfate (CARAFATE) 1 g tablet Take 1 tablet (1 g total) by mouth 4 (four) times daily -  with meals and at bedtime. 09/13/21   Doran Stabler, DO  tamsulosin (FLOMAX) 0.4 MG CAPS capsule Take 1 capsule by mouth at bedtime 10/31/21     traZODone (DESYREL) 50 MG tablet Take 50 mg by mouth at bedtime.    [provider]  vitamin B-12 (CYANOCOBALAMIN) 1000 MCG tablet Take 1,000 mcg by mouth daily.    [provider]      Allergies    Atorvastatin, Statins, and Flexeril [cyclobenzaprine]    Review of Systems   Review of Systems  Genitourinary:  Positive for decreased urine volume.  All other systems reviewed and are negative.   Physical Exam Updated Vital Signs BP (!) 151/73 (BP Location: Left Arm)   Pulse 66   Temp 98 F (36.7 C) (Oral)   Resp 17   SpO2 98%  Physical Exam Vitals and nursing note reviewed.  Constitutional:      Appearance: Normal appearance.  HENT:     Head: Normocephalic and atraumatic.     Right Ear: External ear normal.     Left Ear: External ear normal.     Nose: Nose normal.     Mouth/Throat:     Mouth: Mucous membranes are moist.      Pharynx: Oropharynx is clear.  Eyes:     Extraocular Movements: Extraocular movements intact.     Conjunctiva/sclera: Conjunctivae normal.     Pupils: Pupils are equal, round, and reactive to light.  Cardiovascular:     Rate and Rhythm: Normal rate and regular rhythm.     Pulses: Normal pulses.     Heart sounds: Normal heart sounds.  Pulmonary:     Effort: Pulmonary effort is normal.     Breath  sounds: Normal breath sounds.  Abdominal:     General: Abdomen is flat. Bowel sounds are normal.     Palpations: Abdomen is soft.  Genitourinary:    Comments: Foley cath in place.  Currently draining cloudy urine. Musculoskeletal:     Cervical back: Normal range of motion and neck supple.     Comments: R bka/L aka  Skin:    General: Skin is warm.     Capillary Refill: Capillary refill takes less than 2 seconds.  Neurological:     General: No focal deficit present.     Mental Status: He is alert and oriented to person, place, and time.  Psychiatric:        Mood and Affect: Mood normal.        Behavior: Behavior normal.     ED Results / Procedures / Treatments   Labs (all labs ordered are listed, but only abnormal results are displayed) Labs Reviewed  BASIC METABOLIC PANEL - Abnormal; Notable for the following components:      Result Value   Sodium 133 (*)    Glucose, Bld 147 (*)    Creatinine, Ser 1.35 (*)    Calcium 7.9 (*)    GFR, Estimated 54 (*)    All other components within normal limits  CBC WITH DIFFERENTIAL/PLATELET - Abnormal; Notable for the following components:   Platelets 126 (*)    All other components within normal limits  URINALYSIS, ROUTINE W REFLEX MICROSCOPIC - Abnormal; Notable for the following components:   APPearance CLOUDY (*)    Specific Gravity, Urine 1.002 (*)    Hgb urine dipstick LARGE (*)    Protein, ur 30 (*)    Leukocytes,Ua LARGE (*)    Bacteria, UA FEW (*)    All other components within normal limits  URINE CULTURE     EKG None  Radiology No results found.  Procedures Procedures    Medications Ordered in ED Medications  sulfamethoxazole-trimethoprim (BACTRIM DS) 800-160 MG per tablet 1 tablet (has no administration in time range)    ED Course/ Medical Decision Making/ A&P                             Medical Decision Making Amount and/or Complexity of Data Reviewed Labs: ordered.  Risk Prescription drug management.   This patient presents to the ED for concern of difficulty urinating, this involves an extensive number of treatment options, and is a complaint that carries with it a high risk of complications and morbidity.  The differential diagnosis includes foley problem, uti, aki   Co morbidities that complicate the patient evaluation  asthma, GERD, HTN, HLD, DM2, COPD, right bka and left aka   Additional history obtained:  Additional history obtained from epic chart review External records from outside source obtained and reviewed including EMS reports   Lab Tests:  I Ordered, and personally interpreted labs.  The pertinent results include:  cbc nl, bmp nl other than cr slightly elevated at 1.35, ua (after foley change) showed large LE and >50 wbcs.   Cardiac Monitoring:  The patient was maintained on a cardiac monitor.  I personally viewed and interpreted the cardiac monitored which showed an underlying rhythm of: nsr   Medicines ordered and prescription drug management:  I ordered medication including bactrim  for uti  Reevaluation of the patient after these medicines showed that the patient improved I have reviewed the patients home medicines and have made  adjustments as needed   Problem List / ED Course:  UTI:  Foley catheter changed and urine sent.  Urine suspicious for UTI.  Last urine culture showed MRSA and klebsiella.  Both bacteria were sensitive to bactrim.  So, pt started on bactrim.  Pt is stable for d/c.  Return if worse. F/u with  pcp/uro.   Reevaluation:  After the interventions noted above, I reevaluated the patient and found that they have :improved   Social Determinants of Health:  Lives at home   Dispostion:  After consideration of the diagnostic results and the patients response to treatment, I feel that the patent would benefit from discharge with outpatient f/u.          Final Clinical Impression(s) / ED Diagnoses Final diagnoses:  Obstruction of Foley catheter, initial encounter (HCC)  Urinary tract infection associated with indwelling urethral catheter, initial encounter (HCC)    Rx / DC Orders ED Discharge Orders          Ordered    sulfamethoxazole-trimethoprim (BACTRIM DS) 800-160 MG tablet  2 times daily        04/26/23 1612              Jacalyn Lefevre, MD 04/26/23 1615

## 2023-04-26 NOTE — ED Notes (Addendum)
Foley cath was change by [facility] AND DG Param

## 2023-04-26 NOTE — ED Triage Notes (Signed)
BIBA from home for no urine output in Foley catheter since last night, burning sensation. Per EMS, when they loaded him on to the truck, pt stated the burning went away, and observed that there was now urine in the bag. 140/110 BP 98% 76 HR

## 2023-04-27 ENCOUNTER — Other Ambulatory Visit (HOSPITAL_COMMUNITY): Payer: Self-pay

## 2023-04-27 ENCOUNTER — Telehealth (HOSPITAL_COMMUNITY): Payer: Self-pay | Admitting: Emergency Medicine

## 2023-04-29 LAB — URINE CULTURE: Culture: >=100000 — AB

## 2023-04-30 ENCOUNTER — Telehealth (HOSPITAL_BASED_OUTPATIENT_CLINIC_OR_DEPARTMENT_OTHER): Payer: Self-pay

## 2023-04-30 NOTE — Telephone Encounter (Signed)
Post ED Visit - Positive Culture Follow-up  Culture report reviewed by antimicrobial stewardship pharmacist: [facility] []  Enzo Bi, Pharm.D. []  Celedonio Miyamoto, Pharm.D., BCPS AQ-ID []  Garvin Fila, Pharm.D., BCPS []  Georgina Pillion, Pharm.D., BCPS []  [place], [address].D., BCPS, AAHIVP []  Estella Husk, Pharm.D., BCPS, AAHIVP []  Lysle Pearl, PharmD, BCPS []  Phillips Climes, PharmD, BCPS []  Agapito Games, PharmD, BCPS []  Verlan Friends, PharmD []  Mervyn Gay, PharmD, BCPS []  Vinnie Level, PharmD  [facility] [x]  Lynden Ang, PharmD []  Greer Pickerel, PharmD []  Adalberto Cole, PharmD []  Perlie Gold, Rph []  Lonell Face) Jean Rosenthal, PharmD []  Earl Many, PharmD []  Junita Push, PharmD []  Dorna Leitz, PharmD []  Terrilee Files, PharmD []  Lynann Beaver, PharmD []  Keturah Barre, PharmD []  Loralee Pacas, PharmD []  Bernadene Person, PharmD   Positive urine culture Treated with Sulfamethoxazole-Trimethoprim, organism sensitive to the same and no further patient follow-up is required at this time.  Sandria Senter 04/30/2023, 7:46 AM

## 2023-06-03 ENCOUNTER — Encounter: Payer: Self-pay | Admitting: Neurology

## 2023-06-03 ENCOUNTER — Ambulatory Visit (INDEPENDENT_AMBULATORY_CARE_PROVIDER_SITE_OTHER): Payer: Medicare HMO | Admitting: Neurology

## 2023-06-03 ENCOUNTER — Other Ambulatory Visit (HOSPITAL_COMMUNITY): Payer: Self-pay

## 2023-06-03 ENCOUNTER — Telehealth: Payer: Self-pay | Admitting: Neurology

## 2023-06-03 VITALS — BP 157/82 | HR 79

## 2023-06-03 DIAGNOSIS — H5462 Unqualified visual loss, left eye, normal vision right eye: Secondary | ICD-10-CM

## 2023-06-03 DIAGNOSIS — H3412 Central retinal artery occlusion, left eye: Secondary | ICD-10-CM

## 2023-06-03 MED ORDER — CLOPIDOGREL BISULFATE 75 MG PO TABS
75.0000 mg | ORAL_TABLET | Freq: Every day | ORAL | 11 refills | Status: DC
  Filled 2023-06-03: qty 30, 30d supply, fill #0

## 2023-06-03 MED ORDER — CLOPIDOGREL BISULFATE 75 MG PO TABS: 75.0000 mg | ORAL_TABLET | Freq: Every day | ORAL | 11 refills | Status: AC

## 2023-06-03 NOTE — Progress Notes (Signed)
[facility] [address] [place]. [place] [address] [phone]       OFFICE CONSULT NOTE  Leslie [name] Date of Birth:  28-Jul-1946 Medical Record Number:  [id number]   Referring MD: Marny Lowenstein  Reason for Referral: Left eye vision loss and stroke risk  HPI: Leslie [name] is a 77 year old Caucasian male seen today for initial office consultation visit for left eye vision loss and stroke risk.  History is obtained from the patient and review of referral notes and personally reviewed pertinent available imaging films in PACS.  He has past medical history of diabetes, hypertension, hyperlipidemia, obesity, peripheral vascular disease status post bilateral amputations, coronary artery disease, angina, glaucoma, diabetic retinopathy.  Patient states he developed sudden onset of painless vision loss in the left eye about 2 months ago when he woke up from sleep.  He denied any accompanying headache or any other focal neurological symptoms.  He was seen by ophthalmologist Dr. 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  Patient states his poor vision in the left eye has persisted.  He is able to see only in a small segment towards the center and outer aspect of the left eye.  He denies any prior history of loss of vision.  Denies any accompanying headache, jaw claudication, scalp tenderness.  He was seen by his primary physician who ordered lab work including ESR and C-reactive proteins and carotid ultrasound but I do not have these results to review today.  Patient denies any previous episodes of stroke, TIA, significant neurological problems.  He did have remote history of migraines but has not had one for years.  He lives at home by himself with his dog.  He smokes only occasionally a few cigarettes but not consistently.  He has been on aspirin for many  years.  He has not had any recent brain imaging study done. ROS:   14 system review of systems is positive for vision loss, difficulty walking, all other systems negative  PMH:  Past Medical History:  Diagnosis Date   Anxiety    Panic attack - 10  years aago- ? panic    Asthma    Cellulitis of left foot 2016   Healed and recurred 01/2016   Cellulitis of right foot 06/30/2012   Chronic lower back pain 07/01/2012   COPD (chronic obstructive pulmonary disease) (HCC)    Uses nebulizers at home. Former smoker, current chewing tobacco. 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   GERD (gastroesophageal reflux disease)    Gouty arthritis 07/01/2012   Not attacks in ~5 yrs (03/2016)   Hepatitis 1967   "in jail when I got it; dr said it was pretty bad; quaranteened X 30d"   Hyperlipidemia    Hypertension    Lung nodule, solitary 07/2015   Due for 1-yr recheck in 07/2017   Peripheral arterial disease (HCC)    critical limb ischemia   Peripheral neuropathy 07/01/2012   Seasonal allergies    Skin growth 07/01/2012   right side of nares; "been on there 20 years"   Substance abuse (HCC)    Type II diabetes mellitus (HCC) 2002   Diagnosed in 2002    Social History:  Social History   Socioeconomic History   Marital status: Widowed    Spouse name: Not on file   Number of children: Not on file   Years of education: Not on file  Highest education level: Not on file  Occupational History   Occupation: disabled  Tobacco Use   Smoking status: Former    Current packs/day: 0.00    Average packs/day: 2.0 packs/day for 40.0 years (80.0 ttl pk-yrs)    Types: Cigarettes    Start date: 01/13/1962    Quit date: 01/13/2002    Years since quitting: 21.4   Smokeless tobacco: Current    Types: Chew   Tobacco comments:    chews a little tobacco  Vaping Use   Vaping status: Never Used  Substance and Sexual Activity   Alcohol use: Not Currently    Comment: 03/03/18: "Used to drink alot. 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   Lives in assisted living. Performs ADLs and IADLs, including cooking and managing his medications. Friend drives him to the store. Uses rollator walker at home and motorized scooter in store.   [facility] patient   Widowed   5-8 years education   Likes to fish   Pets:  4 cockatiels and 2 lovebirds   Social Determinants of Corporate investment banker Strain: Not on file  Food Insecurity: Not on file  Transportation Needs: Not on file  Physical Activity: Not on file  Stress: Not on file  Social Connections: Not on file  Intimate Partner Violence: Not on file    Medications:   Current Outpatient Medications on File Prior to Visit  Medication Sig Dispense Refill   acetaminophen (TYLENOL) 325 MG tablet Take 2 tablets (650 mg total) by mouth every 6 (six) hours as needed for mild pain (or Fever >/= 101). (Patient taking differently: Take 650 mg by mouth every 8 (eight) hours as needed for mild pain (or Fever >/= 101).) 30 tablet 0   albuterol (PROVENTIL HFA;VENTOLIN HFA) 108 (90 Base) MCG/ACT inhaler Inhale 2 puffs into the lungs 4 (four) times daily as needed for wheezing or shortness of breath.      albuterol (PROVENTIL) (2.5 MG/3ML) 0.083% nebulizer solution Take 2.5 mg by nebulization every 6 (six) hours as needed for wheezing or shortness of breath.     allopurinol (ZYLOPRIM) 100 MG tablet Take 100 mg by mouth at bedtime. For gout     AMINO ACIDS-PROTEIN HYDROLYS PO Take 1 Dose by mouth 2 (two) times daily. Add SF Pro-Stat     amLODipine (NORVASC) 10 MG tablet Take 1 tablet (10 mg total) by mouth daily. 90 tablet 3   ascorbic acid (VITAMIN C) 1000 MG tablet Take 1 tablet (1,000 mg total) by mouth daily.     aspirin 81 MG chewable tablet Chew  81 mg by mouth daily.     calcium carbonate (TUMS EX) 750 MG chewable tablet Chew 1 tablet by mouth 2 (two) times daily as needed for heartburn.     Cholecalciferol (VITAMIN D-3) 1000 units CAPS Take 1,000 Units by mouth daily.     escitalopram (LEXAPRO) 10 MG tablet Take 10 mg by mouth daily.     Evolocumab with Infusor (REPATHA PUSHTRONEX SYSTEM) 420 MG/3.5ML SOCT Inject 420 mg into the skin every 30 (thirty) days.     ferrous sulfate 325 (65 FE) MG tablet Take 325 mg by mouth every other  day.     finasteride (PROSCAR) 5 MG tablet Take 1 tablet by mouth daily. 90 tablet 3   folic acid (FOLVITE) 1 MG tablet Take 1 tablet (1 mg total) by mouth daily.     hydrOXYzine (ATARAX) 25 MG tablet Take 1 to 2 tablets at bedtime for sleep. 60 tablet 2   insulin aspart (NOVOLOG) 100 UNIT/ML injection Inject 0-9 Units into the skin 3 (three) times daily with meals. 10 mL 11   Melatonin 10 MG TABS Take 10 mg by mouth at bedtime.     metFORMIN (GLUCOPHAGE) 850 MG tablet Take 850 mg by mouth 2 (two) times daily with a meal.     Multiple Vitamins-Minerals (CERTAVITE SENIOR/ANTIOXIDANT) TABS Take 1 tablet by mouth daily.     nitroGLYCERIN (NITROSTAT) 0.4 MG SL tablet Place 0.4 mg under the tongue every 5 (five) minutes x 3 doses as needed for chest pain.      oxyCODONE (OXY IR/ROXICODONE) 5 MG immediate release tablet Take 1-2 tablets (5-10 mg total) by mouth every 4 (four) hours as needed for moderate pain (pain score 4-6). 30 tablet 0   oxyCODONE 10 MG TABS Take 1-1.5 tablets (10-15 mg total) by mouth every 4 (four) hours as needed for severe pain (pain score 7-10). 30 tablet 0   pregabalin (LYRICA) 75 MG capsule Take 1 capsule (75 mg total) by mouth 2 (two) times daily. 60 capsule 0   pregabalin (LYRICA) 75 MG capsule Take 1 capsule (75 mg total) by mouth 2 (two) times daily. 60 capsule 0   senna-docusate (SENOKOT-S) 8.6-50 MG tablet Take 2 tablets by mouth daily.     Skin Protectants, Misc. (MINERIN) CREA Apply  1 application topically 2 (two) times daily as needed (dryness).     sucralfate (CARAFATE) 1 g tablet Take 1 tablet (1 g total) by mouth 4 (four) times daily -  with meals and at bedtime.     tamsulosin (FLOMAX) 0.4 MG CAPS capsule Take 1 capsule by mouth at bedtime 30 capsule 5   traZODone (DESYREL) 50 MG tablet Take 50 mg by mouth at bedtime.     vitamin B-12 (CYANOCOBALAMIN) 1000 MCG tablet Take 1,000 mcg by mouth daily.     pantoprazole (PROTONIX) 40 MG tablet Take 1 tablet (40 mg total) by mouth 2 (two) times daily. 60 tablet 1   No current facility-administered medications on file prior to visit.    Allergies:   Allergies  Allergen Reactions   Atorvastatin Other (See Comments)    Unknown reaction   Statins Other (See Comments)    Unknown reaction - on MAR   Flexeril [Cyclobenzaprine] Other (See Comments)    Dry mouth and hallucinations    Physical Exam General: Obese middle-aged Caucasian male., seated, in no evident distress.  He is in a wheelchair. He has a indwelling bladder catheter Head: head normocephalic and atraumatic.   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 Skin:  no rash/petichiae Vascular:  Normal pulses all extremities Lungs bilateral scattered rhonchi Neurologic Exam Mental Status: Awake and fully alert. Oriented to place and time. Recent and remote memory intact. Attention span, concentration and fund of knowledge appropriate. Mood and affect appropriate.  Cranial Nerves: Fundoscopic exam reveals optic pallor on the left..  Left pupil is sluggishly reactive.  Right pupil reacts well.  Extraocular movements full without nystagmus.  Vision acuity significantly diminished in  the left eye and able to see only towards the central and lateral small area in the left eye.  Normal visual acuity on the right..  Hearing intact. Facial sensation intact. Face, tongue, palate moves normally and symmetrically.  Motor: Normal bulk and tone. Normal strength in all tested extremity muscles but limited in lower extremities due to bilateral amputation.. Sensory.: intact to touch , pinprick , position and vibratory sensation.  Coordination: Intact finger-to-nose coordination bilaterally.  Unable to test need to him. Gait and Station: Patient is in a electric wheelchair and unable to walk at baseline.   Reflexes: 1+ and symmetric in upper extremities and unable to check in the lower extremities  NIHSS  1 Modified Rankin  4   ASSESSMENT: 77 year old Caucasian male painless left eye vision loss few months ago from central retinal artery occlusion.  No clear history of strokes but multiple vascular risk factors of diabetes, hypertension, hyperlipidemia, peripheral arterial disease and obesity.     PLAN: I had a long d/w patient about his recent episode of painless left eye vision loss likely due to central retinal artery occlusion and future, risk for recurrent stroke/TIAs, personally independently reviewed imaging studies and stroke evaluation results and answered questions.recommend discontinuing aspirin and switch to Plavix 75 mg daily   for secondary stroke prevention and maintain strict control of hypertension with blood pressure goal below 130/90, diabetes with hemoglobin A1c goal below 6.5% and lipids with LDL cholesterol goal below 70 mg/dL. I also advised the patient to eat a healthy diet with plenty of whole grains, cereals, fruits and vegetables, exercise regularly and maintain ideal body weight .check MRI scan of the brain, MR angiogram of the brain and neck, echocardiogram, lipid profile, hemoglobin A1c and ESR.  I also counseled him to quit smoking completely.  Followup in the future with me in 4 months or call earlier if necessary. Greater than 50% time during this 45-minute consultation visit was spent on  counseling and coordination of care about his left eye vision loss and discussion about stroke risk and answering questions Delia Heady, MD Note: This document was prepared with digital dictation and possible smart phrase technology. Any transcriptional errors that result from this process are unintentional.

## 2023-06-03 NOTE — Patient Instructions (Signed)
I had a long d/w patient about his recent episode of painless left eye vision loss likely due to central retinal artery occlusion and future, risk for recurrent stroke/TIAs, personally independently reviewed imaging studies and stroke evaluation results and answered questions.recommend discontinuing aspirin and switch to Plavix 75 mg daily   for secondary stroke prevention and maintain strict control of hypertension with blood pressure goal below 130/90, diabetes with hemoglobin A1c goal below 6.5% and lipids with LDL cholesterol goal below 70 mg/dL. I also advised the patient to eat a healthy diet with plenty of whole grains, cereals, fruits and vegetables, exercise regularly and maintain ideal body weight .check MRI scan of the brain, MR angiogram of the brain and neck, echocardiogram, lipid profile, hemoglobin A1c and ESR.  I also counseled him to quit smoking completely.  Followup in the future with me in 4 months or call earlier if necessary.  Stroke Prevention Some medical conditions and behaviors can lead to a higher chance of having a stroke. You can help prevent a stroke by eating healthy, exercising, not smoking, and managing any medical conditions you have. Stroke is a leading cause of functional impairment. Primary prevention is particularly important because a majority of strokes are first-time events. Stroke changes the lives of not only those who experience a stroke but also their family and other caregivers. How can this condition affect me? A stroke is a medical emergency and should be treated right away. A stroke can lead to brain damage and can sometimes be life-threatening. If a person gets medical treatment right away, there is a better chance of surviving and recovering from a stroke. What can increase my risk? The following medical conditions may increase your risk of a stroke: Cardiovascular disease. High blood pressure (hypertension). Diabetes. High cholesterol. Sickle cell  disease. Blood clotting disorders (hypercoagulable state). Obesity. Sleep disorders (obstructive sleep apnea). Other risk factors include: Being older than age 102. Having a history of blood clots, stroke, or mini-stroke (transient ischemic attack, TIA). Genetic factors, such as race, ethnicity, or a family history of stroke. Smoking cigarettes or using other tobacco products. Taking birth control pills, especially if you also use tobacco. Heavy use of alcohol or drugs, especially cocaine and methamphetamine. Physical inactivity. What actions can I take to prevent this? Manage your health conditions High cholesterol levels. Eating a healthy diet is important for preventing high cholesterol. If cholesterol cannot be managed through diet alone, you may need to take medicines. Take any prescribed medicines to control your cholesterol as told by your health care provider. Hypertension. To reduce your risk of stroke, try to keep your blood pressure below 130/80. Eating a healthy diet and exercising regularly are important for controlling blood pressure. If these steps are not enough to manage your blood pressure, you may need to take medicines. Take any prescribed medicines to control hypertension as told by your health care provider. Ask your health care provider if you should monitor your blood pressure at home. Have your blood pressure checked every year, even if your blood pressure is normal. Blood pressure increases with age and some medical conditions. Diabetes. Eating a healthy diet and exercising regularly are important parts of managing your blood sugar (glucose). If your blood sugar cannot be managed through diet and exercise, you may need to take medicines. Take any prescribed medicines to control your diabetes as told by your health care provider. Get evaluated for obstructive sleep apnea. Talk to your health care provider about getting a sleep evaluation  if you snore a lot or have  excessive sleepiness. Make sure that any other medical conditions you have, such as atrial fibrillation or atherosclerosis, are managed. Nutrition Follow instructions from your health care provider about what to eat or drink to help manage your health condition. These instructions may include: Reducing your daily calorie intake. Limiting how much salt (sodium) you use to 1,500 milligrams (mg) each day. Using only healthy fats for cooking, such as olive oil, canola oil, or sunflower oil. Eating healthy foods. You can do this by: Choosing foods that are high in fiber, such as whole grains, and fresh fruits and vegetables. Eating at least 5 servings of fruits and vegetables a day. Try to fill one-half of your plate with fruits and vegetables at each meal. Choosing lean protein foods, such as lean cuts of meat, poultry without skin, fish, tofu, beans, and nuts. Eating low-fat dairy products. Avoiding foods that are high in sodium. This can help lower blood pressure. Avoiding foods that have saturated fat, trans fat, and cholesterol. This can help prevent high cholesterol. Avoiding processed and prepared foods. Counting your daily carbohydrate intake.  Lifestyle If you drink alcohol: Limit how much you have to: 0-1 drink a day for women who are not pregnant. 0-2 drinks a day for men. Know how much alcohol is in your drink. In the [place] one drink equals one 12 oz bottle of beer ( ), one 5 oz glass of wine ( ), or one 1 oz glass of hard liquor (44mL). Do not use any products that contain nicotine or tobacco. These products include cigarettes, chewing tobacco, and vaping devices, such as e-cigarettes. If you need help quitting, ask your health care provider. Avoid secondhand smoke. Do not use drugs. Activity  Try to stay at a healthy weight. Get at least 30 minutes of exercise on most days, such as: Fast walking. Biking. Swimming. Medicines Take over-the-counter and prescription  medicines only as told by your health care provider. Aspirin or blood thinners (antiplatelets or anticoagulants) may be recommended to reduce your risk of forming blood clots that can lead to stroke. Avoid taking birth control pills. Talk to your health care provider about the risks of taking birth control pills if: You are over 19 years old. You smoke. You get very bad headaches. You have had a blood clot. Where to find more information American Stroke Association: www.strokeassociation.org Get help right away if: You or a loved one has any symptoms of a stroke. "BE FAST" is an easy way to remember the main warning signs of a stroke: B - Balance. Signs are dizziness, sudden trouble walking, or loss of balance. E - Eyes. Signs are trouble seeing or a sudden change in vision. F - Face. Signs are sudden weakness or numbness of the face, or the face or eyelid drooping on one side. A - Arms. Signs are weakness or numbness in an arm. This happens suddenly and usually on one side of the body. S - Speech. Signs are sudden trouble speaking, slurred speech, or trouble understanding what people say. T - Time. Time to call emergency services. Write down what time symptoms started. You or a loved one has other signs of a stroke, such as: A sudden, severe headache with no known cause. Nausea or vomiting. Seizure. These symptoms may represent a serious problem that is an emergency. Do not wait to see if the symptoms will go away. Get medical help right away. Call your local emergency services (911 in the [place]  Do not drive yourself to the hospital. Summary You can help to prevent a stroke by eating healthy, exercising, not smoking, limiting alcohol intake, and managing any medical conditions you may have. Do not use any products that contain nicotine or tobacco. These include cigarettes, chewing tobacco, and vaping devices, such as e-cigarettes. If you need help quitting, ask your health care  provider. Remember "BE FAST" for warning signs of a stroke. Get help right away if you or a loved one has any of these signs. This information is not intended to replace advice given to you by your health care provider. Make sure you discuss any questions you have with your health care provider. Document Revised: 09/15/2022 Document Reviewed: 09/15/2022 Elsevier Patient Education  2024 [employer].

## 2023-06-03 NOTE — Telephone Encounter (Signed)
Orders sent to [facility] [phone]

## 2023-06-03 NOTE — Addendum Note (Signed)
Addended by: Gates Rigg on: 06/03/2023 11:15 AM   Modules accepted: Orders

## 2023-06-07 NOTE — Progress Notes (Signed)
Kindly advise the patient that cholesterol profile is mostly satisfactory except triglycerides are elevated though improved from before and is the primary care physician for advised on this

## 2023-06-08 ENCOUNTER — Telehealth: Payer: Self-pay

## 2023-06-08 NOTE — Telephone Encounter (Signed)
-----   Message from Delia Heady sent at 06/07/2023  3:54 PM EDT ----- Joneen Roach advise the patient that cholesterol profile is mostly satisfactory except triglycerides are elevated though improved from before and is the primary care physician for advised on this

## 2023-06-08 NOTE — Telephone Encounter (Signed)
I called the patient to provide results of the lab test. During the first attempt to call the patient, someone answered and hung up without saying anything. I attempted the call again and was able to leave a message on the patient's voicemail regarding his results. He was advised to contact his PCP for management. A number for a call back was provided, if needed.

## 2023-06-22 DIAGNOSIS — Z136 Encounter for screening for cardiovascular disorders: Secondary | ICD-10-CM | POA: Diagnosis not present

## 2023-06-22 DIAGNOSIS — M109 Gout, unspecified: Secondary | ICD-10-CM | POA: Diagnosis not present

## 2023-06-22 DIAGNOSIS — Z1159 Encounter for screening for other viral diseases: Secondary | ICD-10-CM | POA: Diagnosis not present

## 2023-06-22 DIAGNOSIS — Z114 Encounter for screening for human immunodeficiency virus [HIV]: Secondary | ICD-10-CM | POA: Diagnosis not present

## 2023-06-22 DIAGNOSIS — Z125 Encounter for screening for malignant neoplasm of prostate: Secondary | ICD-10-CM | POA: Diagnosis not present

## 2023-06-22 DIAGNOSIS — N400 Enlarged prostate without lower urinary tract symptoms: Secondary | ICD-10-CM | POA: Diagnosis not present

## 2023-06-22 DIAGNOSIS — G629 Polyneuropathy, unspecified: Secondary | ICD-10-CM | POA: Diagnosis not present

## 2023-06-22 DIAGNOSIS — Z79899 Other long term (current) drug therapy: Secondary | ICD-10-CM | POA: Diagnosis not present

## 2023-06-22 DIAGNOSIS — E119 Type 2 diabetes mellitus without complications: Secondary | ICD-10-CM | POA: Diagnosis not present

## 2023-07-06 DIAGNOSIS — I10 Essential (primary) hypertension: Secondary | ICD-10-CM | POA: Diagnosis not present

## 2023-07-06 DIAGNOSIS — N401 Enlarged prostate with lower urinary tract symptoms: Secondary | ICD-10-CM | POA: Diagnosis not present

## 2023-07-06 DIAGNOSIS — Z0189 Encounter for other specified special examinations: Secondary | ICD-10-CM | POA: Diagnosis not present

## 2023-07-06 DIAGNOSIS — Z79899 Other long term (current) drug therapy: Secondary | ICD-10-CM | POA: Diagnosis not present

## 2023-07-06 DIAGNOSIS — I110 Hypertensive heart disease with heart failure: Secondary | ICD-10-CM | POA: Diagnosis not present

## 2023-07-06 DIAGNOSIS — I700 Atherosclerosis of aorta: Secondary | ICD-10-CM | POA: Diagnosis not present

## 2023-07-06 DIAGNOSIS — J438 Other emphysema: Secondary | ICD-10-CM | POA: Diagnosis not present

## 2023-07-06 DIAGNOSIS — R338 Other retention of urine: Secondary | ICD-10-CM | POA: Diagnosis not present

## 2023-07-06 DIAGNOSIS — Z0001 Encounter for general adult medical examination with abnormal findings: Secondary | ICD-10-CM | POA: Diagnosis not present

## 2023-07-07 ENCOUNTER — Other Ambulatory Visit: Payer: Self-pay | Admitting: Nurse Practitioner

## 2023-07-07 DIAGNOSIS — F17210 Nicotine dependence, cigarettes, uncomplicated: Secondary | ICD-10-CM

## 2023-07-07 DIAGNOSIS — R911 Solitary pulmonary nodule: Secondary | ICD-10-CM

## 2023-07-07 DIAGNOSIS — Z0000 Encounter for general adult medical examination without abnormal findings: Secondary | ICD-10-CM

## 2023-07-10 DIAGNOSIS — I130 Hypertensive heart and chronic kidney disease with heart failure and stage 1 through stage 4 chronic kidney disease, or unspecified chronic kidney disease: Secondary | ICD-10-CM | POA: Diagnosis not present

## 2023-07-10 DIAGNOSIS — E1140 Type 2 diabetes mellitus with diabetic neuropathy, unspecified: Secondary | ICD-10-CM | POA: Diagnosis not present

## 2023-07-10 DIAGNOSIS — E1151 Type 2 diabetes mellitus with diabetic peripheral angiopathy without gangrene: Secondary | ICD-10-CM | POA: Diagnosis not present

## 2023-07-10 DIAGNOSIS — I509 Heart failure, unspecified: Secondary | ICD-10-CM | POA: Diagnosis not present

## 2023-07-10 DIAGNOSIS — R338 Other retention of urine: Secondary | ICD-10-CM | POA: Diagnosis not present

## 2023-07-10 DIAGNOSIS — I70203 Unspecified atherosclerosis of native arteries of extremities, bilateral legs: Secondary | ICD-10-CM | POA: Diagnosis not present

## 2023-07-10 DIAGNOSIS — E1122 Type 2 diabetes mellitus with diabetic chronic kidney disease: Secondary | ICD-10-CM | POA: Diagnosis not present

## 2023-07-10 DIAGNOSIS — N401 Enlarged prostate with lower urinary tract symptoms: Secondary | ICD-10-CM | POA: Diagnosis not present

## 2023-07-10 DIAGNOSIS — Z466 Encounter for fitting and adjustment of urinary device: Secondary | ICD-10-CM | POA: Diagnosis not present

## 2023-07-13 ENCOUNTER — Encounter: Payer: Self-pay | Admitting: Nurse Practitioner

## 2023-07-13 DIAGNOSIS — R338 Other retention of urine: Secondary | ICD-10-CM | POA: Diagnosis not present

## 2023-07-14 DIAGNOSIS — Z466 Encounter for fitting and adjustment of urinary device: Secondary | ICD-10-CM | POA: Diagnosis not present

## 2023-07-14 DIAGNOSIS — N401 Enlarged prostate with lower urinary tract symptoms: Secondary | ICD-10-CM | POA: Diagnosis not present

## 2023-07-14 DIAGNOSIS — E1151 Type 2 diabetes mellitus with diabetic peripheral angiopathy without gangrene: Secondary | ICD-10-CM | POA: Diagnosis not present

## 2023-07-14 DIAGNOSIS — E1140 Type 2 diabetes mellitus with diabetic neuropathy, unspecified: Secondary | ICD-10-CM | POA: Diagnosis not present

## 2023-07-14 DIAGNOSIS — I70203 Unspecified atherosclerosis of native arteries of extremities, bilateral legs: Secondary | ICD-10-CM | POA: Diagnosis not present

## 2023-07-14 DIAGNOSIS — R338 Other retention of urine: Secondary | ICD-10-CM | POA: Diagnosis not present

## 2023-07-14 DIAGNOSIS — I130 Hypertensive heart and chronic kidney disease with heart failure and stage 1 through stage 4 chronic kidney disease, or unspecified chronic kidney disease: Secondary | ICD-10-CM | POA: Diagnosis not present

## 2023-07-14 DIAGNOSIS — I509 Heart failure, unspecified: Secondary | ICD-10-CM | POA: Diagnosis not present

## 2023-07-14 DIAGNOSIS — E1122 Type 2 diabetes mellitus with diabetic chronic kidney disease: Secondary | ICD-10-CM | POA: Diagnosis not present

## 2023-07-16 DIAGNOSIS — R338 Other retention of urine: Secondary | ICD-10-CM | POA: Diagnosis not present

## 2023-07-16 DIAGNOSIS — I509 Heart failure, unspecified: Secondary | ICD-10-CM | POA: Diagnosis not present

## 2023-07-16 DIAGNOSIS — I70203 Unspecified atherosclerosis of native arteries of extremities, bilateral legs: Secondary | ICD-10-CM | POA: Diagnosis not present

## 2023-07-16 DIAGNOSIS — N401 Enlarged prostate with lower urinary tract symptoms: Secondary | ICD-10-CM | POA: Diagnosis not present

## 2023-07-16 DIAGNOSIS — E1122 Type 2 diabetes mellitus with diabetic chronic kidney disease: Secondary | ICD-10-CM | POA: Diagnosis not present

## 2023-07-16 DIAGNOSIS — I130 Hypertensive heart and chronic kidney disease with heart failure and stage 1 through stage 4 chronic kidney disease, or unspecified chronic kidney disease: Secondary | ICD-10-CM | POA: Diagnosis not present

## 2023-07-16 DIAGNOSIS — Z466 Encounter for fitting and adjustment of urinary device: Secondary | ICD-10-CM | POA: Diagnosis not present

## 2023-07-16 DIAGNOSIS — E1140 Type 2 diabetes mellitus with diabetic neuropathy, unspecified: Secondary | ICD-10-CM | POA: Diagnosis not present

## 2023-07-16 DIAGNOSIS — E1151 Type 2 diabetes mellitus with diabetic peripheral angiopathy without gangrene: Secondary | ICD-10-CM | POA: Diagnosis not present

## 2023-07-21 DIAGNOSIS — N401 Enlarged prostate with lower urinary tract symptoms: Secondary | ICD-10-CM | POA: Diagnosis not present

## 2023-07-21 DIAGNOSIS — E1151 Type 2 diabetes mellitus with diabetic peripheral angiopathy without gangrene: Secondary | ICD-10-CM | POA: Diagnosis not present

## 2023-07-21 DIAGNOSIS — Z466 Encounter for fitting and adjustment of urinary device: Secondary | ICD-10-CM | POA: Diagnosis not present

## 2023-07-21 DIAGNOSIS — I130 Hypertensive heart and chronic kidney disease with heart failure and stage 1 through stage 4 chronic kidney disease, or unspecified chronic kidney disease: Secondary | ICD-10-CM | POA: Diagnosis not present

## 2023-07-21 DIAGNOSIS — I509 Heart failure, unspecified: Secondary | ICD-10-CM | POA: Diagnosis not present

## 2023-07-21 DIAGNOSIS — E1122 Type 2 diabetes mellitus with diabetic chronic kidney disease: Secondary | ICD-10-CM | POA: Diagnosis not present

## 2023-07-21 DIAGNOSIS — E1140 Type 2 diabetes mellitus with diabetic neuropathy, unspecified: Secondary | ICD-10-CM | POA: Diagnosis not present

## 2023-07-21 DIAGNOSIS — I70203 Unspecified atherosclerosis of native arteries of extremities, bilateral legs: Secondary | ICD-10-CM | POA: Diagnosis not present

## 2023-07-21 DIAGNOSIS — R338 Other retention of urine: Secondary | ICD-10-CM | POA: Diagnosis not present

## 2023-07-23 ENCOUNTER — Ambulatory Visit
Admission: RE | Admit: 2023-07-23 | Discharge: 2023-07-23 | Disposition: A | Discharge status: Home / Self Care | Payer: Medicare (Managed Care) | Source: Ambulatory Visit | Attending: Nurse Practitioner | Admitting: Nurse Practitioner

## 2023-07-23 DIAGNOSIS — Z0000 Encounter for general adult medical examination without abnormal findings: Secondary | ICD-10-CM

## 2023-07-23 DIAGNOSIS — R911 Solitary pulmonary nodule: Secondary | ICD-10-CM

## 2023-07-23 DIAGNOSIS — F17210 Nicotine dependence, cigarettes, uncomplicated: Secondary | ICD-10-CM

## 2023-07-28 DIAGNOSIS — N401 Enlarged prostate with lower urinary tract symptoms: Secondary | ICD-10-CM | POA: Diagnosis not present

## 2023-07-28 DIAGNOSIS — I70203 Unspecified atherosclerosis of native arteries of extremities, bilateral legs: Secondary | ICD-10-CM | POA: Diagnosis not present

## 2023-07-28 DIAGNOSIS — Z466 Encounter for fitting and adjustment of urinary device: Secondary | ICD-10-CM | POA: Diagnosis not present

## 2023-07-28 DIAGNOSIS — E1140 Type 2 diabetes mellitus with diabetic neuropathy, unspecified: Secondary | ICD-10-CM | POA: Diagnosis not present

## 2023-07-28 DIAGNOSIS — I509 Heart failure, unspecified: Secondary | ICD-10-CM | POA: Diagnosis not present

## 2023-07-28 DIAGNOSIS — R338 Other retention of urine: Secondary | ICD-10-CM | POA: Diagnosis not present

## 2023-07-28 DIAGNOSIS — I130 Hypertensive heart and chronic kidney disease with heart failure and stage 1 through stage 4 chronic kidney disease, or unspecified chronic kidney disease: Secondary | ICD-10-CM | POA: Diagnosis not present

## 2023-07-28 DIAGNOSIS — E1151 Type 2 diabetes mellitus with diabetic peripheral angiopathy without gangrene: Secondary | ICD-10-CM | POA: Diagnosis not present

## 2023-07-28 DIAGNOSIS — E1122 Type 2 diabetes mellitus with diabetic chronic kidney disease: Secondary | ICD-10-CM | POA: Diagnosis not present

## 2023-08-04 DIAGNOSIS — E1140 Type 2 diabetes mellitus with diabetic neuropathy, unspecified: Secondary | ICD-10-CM | POA: Diagnosis not present

## 2023-08-04 DIAGNOSIS — I509 Heart failure, unspecified: Secondary | ICD-10-CM | POA: Diagnosis not present

## 2023-08-04 DIAGNOSIS — E1122 Type 2 diabetes mellitus with diabetic chronic kidney disease: Secondary | ICD-10-CM | POA: Diagnosis not present

## 2023-08-04 DIAGNOSIS — R338 Other retention of urine: Secondary | ICD-10-CM | POA: Diagnosis not present

## 2023-08-04 DIAGNOSIS — N401 Enlarged prostate with lower urinary tract symptoms: Secondary | ICD-10-CM | POA: Diagnosis not present

## 2023-08-04 DIAGNOSIS — E1151 Type 2 diabetes mellitus with diabetic peripheral angiopathy without gangrene: Secondary | ICD-10-CM | POA: Diagnosis not present

## 2023-08-04 DIAGNOSIS — I130 Hypertensive heart and chronic kidney disease with heart failure and stage 1 through stage 4 chronic kidney disease, or unspecified chronic kidney disease: Secondary | ICD-10-CM | POA: Diagnosis not present

## 2023-08-04 DIAGNOSIS — Z466 Encounter for fitting and adjustment of urinary device: Secondary | ICD-10-CM | POA: Diagnosis not present

## 2023-08-04 DIAGNOSIS — I70203 Unspecified atherosclerosis of native arteries of extremities, bilateral legs: Secondary | ICD-10-CM | POA: Diagnosis not present

## 2023-12-17 ENCOUNTER — Ambulatory Visit: Payer: 59 | Admitting: Neurology

## 2023-12-22 ENCOUNTER — Other Ambulatory Visit: Payer: Self-pay

## 2023-12-22 ENCOUNTER — Emergency Department (HOSPITAL_COMMUNITY)
Admission: EM | Admit: 2023-12-22 | Discharge: 2023-12-22 | Disposition: A | Discharge status: Home / Self Care | Payer: 59 | Attending: Emergency Medicine | Admitting: Emergency Medicine

## 2023-12-22 ENCOUNTER — Encounter (HOSPITAL_COMMUNITY): Payer: Self-pay | Admitting: Emergency Medicine

## 2023-12-22 DIAGNOSIS — Z7984 Long term (current) use of oral hypoglycemic drugs: Secondary | ICD-10-CM | POA: Insufficient documentation

## 2023-12-22 DIAGNOSIS — J449 Chronic obstructive pulmonary disease, unspecified: Secondary | ICD-10-CM | POA: Insufficient documentation

## 2023-12-22 DIAGNOSIS — Z7901 Long term (current) use of anticoagulants: Secondary | ICD-10-CM | POA: Diagnosis not present

## 2023-12-22 DIAGNOSIS — N390 Urinary tract infection, site not specified: Secondary | ICD-10-CM | POA: Diagnosis not present

## 2023-12-22 DIAGNOSIS — I10 Essential (primary) hypertension: Secondary | ICD-10-CM | POA: Insufficient documentation

## 2023-12-22 DIAGNOSIS — Z794 Long term (current) use of insulin: Secondary | ICD-10-CM | POA: Diagnosis not present

## 2023-12-22 DIAGNOSIS — E1140 Type 2 diabetes mellitus with diabetic neuropathy, unspecified: Secondary | ICD-10-CM | POA: Diagnosis not present

## 2023-12-22 DIAGNOSIS — T83511A Infection and inflammatory reaction due to indwelling urethral catheter, initial encounter: Secondary | ICD-10-CM | POA: Insufficient documentation

## 2023-12-22 DIAGNOSIS — Z79899 Other long term (current) drug therapy: Secondary | ICD-10-CM | POA: Insufficient documentation

## 2023-12-22 DIAGNOSIS — R300 Dysuria: Secondary | ICD-10-CM | POA: Diagnosis present

## 2023-12-22 LAB — CBC WITH DIFFERENTIAL/PLATELET
Abs Immature Granulocytes: 0.03 10*3/uL (ref 0.00–0.07)
Basophils Absolute: 0.1 10*3/uL (ref 0.0–0.1)
Basophils Relative: 1 %
Eosinophils Absolute: 0.1 10*3/uL (ref 0.0–0.5)
Eosinophils Relative: 1 %
HCT: 42.6 % (ref 39.0–52.0)
Hemoglobin: 14.3 g/dL (ref 13.0–17.0)
Immature Granulocytes: 0 %
Lymphocytes Relative: 28 %
Lymphs Abs: 2.7 10*3/uL (ref 0.7–4.0)
MCH: 29.4 pg (ref 26.0–34.0)
MCHC: 33.6 g/dL (ref 30.0–36.0)
MCV: 87.5 fL (ref 80.0–100.0)
Monocytes Absolute: 0.7 10*3/uL (ref 0.1–1.0)
Monocytes Relative: 8 %
Neutro Abs: 5.9 10*3/uL (ref 1.7–7.7)
Neutrophils Relative %: 62 %
Platelets: 168 10*3/uL (ref 150–400)
RBC: 4.87 MIL/uL (ref 4.22–5.81)
RDW: 13.6 % (ref 11.5–15.5)
WBC: 9.5 10*3/uL (ref 4.0–10.5)
nRBC: 0 % (ref 0.0–0.2)

## 2023-12-22 LAB — COMPREHENSIVE METABOLIC PANEL
ALT: 18 U/L (ref 0–44)
AST: 19 U/L (ref 15–41)
Albumin: 3.9 g/dL (ref 3.5–5.0)
Alkaline Phosphatase: 87 U/L (ref 38–126)
Anion gap: 11 (ref 5–15)
BUN: 19 mg/dL (ref 8–23)
CO2: 25 mmol/L (ref 22–32)
Calcium: 9.1 mg/dL (ref 8.9–10.3)
Chloride: 100 mmol/L (ref 98–111)
Creatinine, Ser: 1.41 mg/dL — ABNORMAL HIGH (ref 0.61–1.24)
GFR, Estimated: 51 mL/min — ABNORMAL LOW (ref >60)
Glucose, Bld: 194 mg/dL — ABNORMAL HIGH (ref 70–99)
Potassium: 4.2 mmol/L (ref 3.5–5.1)
Sodium: 136 mmol/L (ref 135–145)
Total Bilirubin: 0.5 mg/dL (ref 0.0–1.2)
Total Protein: 7.5 g/dL (ref 6.5–8.1)

## 2023-12-22 LAB — URINALYSIS, ROUTINE W REFLEX MICROSCOPIC
Bilirubin Urine: NEGATIVE
Glucose, UA: NEGATIVE mg/dL
Hgb urine dipstick: NEGATIVE
Ketones, ur: NEGATIVE mg/dL
Nitrite: NEGATIVE
Protein, ur: 100 mg/dL — AB
Specific Gravity, Urine: 1.01 (ref 1.005–1.030)
pH: 8 (ref 5.0–8.0)

## 2023-12-22 MED ORDER — SULFAMETHOXAZOLE-TRIMETHOPRIM 800-160 MG PO TABS
1.0000 | ORAL_TABLET | Freq: Once | ORAL | Status: AC
  Administered 2023-12-22: 1 via ORAL
  Filled 2023-12-22: qty 1

## 2023-12-22 MED ORDER — SULFAMETHOXAZOLE-TRIMETHOPRIM 800-160 MG PO TABS: 1.0000 | ORAL_TABLET | Freq: Two times a day (BID) | ORAL | 0 refills | Status: AC

## 2023-12-22 NOTE — ED Triage Notes (Signed)
 Patient complains of burning around cath, He wants it changed,. He has noticed malodorous urine and a change it urine appearance   EMS vitals: 140/90 BP 90 HR 16 RR 97% SPO2 on room air 253 CBG

## 2023-12-22 NOTE — Discharge Instructions (Signed)
 You are seen in the emergency department today for concerns of pain with urination.  Your urine was contaminated and showing signs of infection.  You are started on Bactrim for antibiotic therapy for this.  Please take this medication as prescribed.  For any concerns of new or worsening symptoms, return to the emergency department.  Otherwise, please follow-up with your primary care provider.

## 2023-12-22 NOTE — ED Provider Notes (Signed)
 [place] EMERGENCY DEPARTMENT AT [facility] Provider Note   CSN: [id number] Arrival date & time: 12/22/23  1323     History Chief Complaint  Patient presents with   Dysuria    Leslie [name] is a 78 y.o. male.  Patient with history of chronic indwelling Foley catheter, diabetic neuropathy, diabetes mellitus, hypertension, COPD presents the emergency department concerns of dysuria.  Reports this been ongoing for several weeks and also reports a foul odor to the urine.  Also reports the urine has become more cloudy.   Dysuria Presenting symptoms: dysuria        Home Medications Prior to Admission medications   Medication Sig Start Date End Date Taking? Authorizing Provider  acetaminophen (TYLENOL) 325 MG tablet Take 2 tablets (650 mg total) by mouth every 6 (six) hours as needed for mild pain (or Fever >/= 101). Patient taking differently: Take 650 mg by mouth every 8 (eight) hours as needed for mild pain (or Fever >/= 101). 03/30/18   Synetta Fail, MD  albuterol (PROVENTIL HFA;VENTOLIN HFA) 108 (90 Base) MCG/ACT inhaler Inhale 2 puffs into the lungs 4 (four) times daily as needed for wheezing or shortness of breath.     [provider]  albuterol (PROVENTIL) (2.5 MG/3ML) 0.083% nebulizer solution Take 2.5 mg by nebulization every 6 (six) hours as needed for wheezing or shortness of breath.    [provider]  allopurinol (ZYLOPRIM) 100 MG tablet Take 100 mg by mouth at bedtime. For gout    [provider]  AMINO ACIDS-PROTEIN HYDROLYS PO Take 1 Dose by mouth 2 (two) times daily. Add SF Pro-Stat    [provider]  amLODipine (NORVASC) 10 MG tablet Take 1 tablet (10 mg total) by mouth daily. 08/09/12   Funches, [name], MD  ascorbic acid (VITAMIN C) 1000 MG tablet Take 1 tablet (1,000 mg total) by mouth daily. 09/14/21   Doran Stabler, DO  calcium carbonate (TUMS EX) 750 MG chewable tablet Chew 1 tablet by mouth 2 (two) times  daily as needed for heartburn.    [provider]  Cholecalciferol (VITAMIN D-3) 1000 units CAPS Take 1,000 Units by mouth daily.    [provider]  clopidogrel (PLAVIX) 75 MG tablet Take 1 tablet (75 mg total) by mouth daily. 06/03/23 06/02/24  Micki Riley, MD  escitalopram (LEXAPRO) 10 MG tablet Take 10 mg by mouth daily.    [provider]  Evolocumab with Infusor (REPATHA PUSHTRONEX SYSTEM) 420 MG/3.5ML SOCT Inject 420 mg into the skin every 30 (thirty) days.    [provider]  ferrous sulfate 325 (65 FE) MG tablet Take 325 mg by mouth every other day.    [provider]  finasteride (PROSCAR) 5 MG tablet Take 1 tablet by mouth daily. 01/27/22     folic acid (FOLVITE) 1 MG tablet Take 1 tablet (1 mg total) by mouth daily. 09/14/21   Doran Stabler, DO  hydrOXYzine (ATARAX) 25 MG tablet Take 1 to 2 tablets at bedtime for sleep. 12/22/22   Mozingo, [name], NP  insulin aspart (NOVOLOG) 100 UNIT/ML injection Inject 0-9 Units into the skin 3 (three) times daily with meals. 10/07/21   Demaio, Alexa, MD  Melatonin 10 MG TABS Take 10 mg by mouth at bedtime.    [provider]  metFORMIN (GLUCOPHAGE) 850 MG tablet Take 850 mg by mouth 2 (two) times daily with a meal.    [provider]  Multiple Vitamins-Minerals (CERTAVITE  SENIOR/ANTIOXIDANT) TABS Take 1 tablet by mouth daily.    [provider]  nitroGLYCERIN (NITROSTAT) 0.4 MG SL tablet Place 0.4 mg under the tongue every 5 (five) minutes x 3 doses as needed for chest pain.  11/12/11   Funches, [name], MD  oxyCODONE (OXY IR/ROXICODONE) 5 MG immediate release tablet Take 1-2 tablets (5-10 mg total) by mouth every 4 (four) hours as needed for moderate pain (pain score 4-6). 09/13/21   Dellis Filbert, MD  oxyCODONE 10 MG TABS Take 1-1.5 tablets (10-15 mg total) by mouth every 4 (four) hours as needed for severe pain (pain score 7-10). 09/13/21   Dellis Filbert, MD   pantoprazole (PROTONIX) 40 MG tablet Take 1 tablet (40 mg total) by mouth 2 (two) times daily. 10/07/21 12/06/21  Demaio, [name], MD  pregabalin (LYRICA) 75 MG capsule Take 1 capsule (75 mg total) by mouth 2 (two) times daily. 10/24/21   Nadara Mustard, MD  pregabalin (LYRICA) 75 MG capsule Take 1 capsule (75 mg total) by mouth 2 (two) times daily. 10/24/21   Nadara Mustard, MD  senna-docusate (SENOKOT-S) 8.6-50 MG tablet Take 2 tablets by mouth daily.    [provider]  Skin Protectants, Misc. (MINERIN) CREA Apply 1 application topically 2 (two) times daily as needed (dryness).    [provider]  sucralfate (CARAFATE) 1 g tablet Take 1 tablet (1 g total) by mouth 4 (four) times daily -  with meals and at bedtime. 09/13/21   Doran Stabler, DO  sulfamethoxazole-trimethoprim (BACTRIM DS) 800-160 MG tablet Take 1 tablet by mouth 2 (two) times daily for 7 days. 12/22/23 12/29/23 Yes Smitty Knudsen, PA-C  tamsulosin (FLOMAX) 0.4 MG CAPS capsule Take 1 capsule by mouth at bedtime 10/31/21     traZODone (DESYREL) 50 MG tablet Take 50 mg by mouth at bedtime.    [provider]  vitamin B-12 (CYANOCOBALAMIN) 1000 MCG tablet Take 1,000 mcg by mouth daily.    [provider]      Allergies    Atorvastatin, Statins, and Flexeril [cyclobenzaprine]    Review of Systems   Review of Systems  Genitourinary:  Positive for dysuria.  All other systems reviewed and are negative.   Physical Exam Updated Vital Signs BP (!) 189/78   Pulse 89   Temp 98 F (36.7 C)   Resp 16   SpO2 100%  Physical Exam Vitals and nursing note reviewed.  Constitutional:      General: He is not in acute distress.    Appearance: He is well-developed.  HENT:     Head: Normocephalic and atraumatic.  Eyes:     Conjunctiva/sclera: Conjunctivae normal.  Cardiovascular:     Rate and Rhythm: Normal rate and regular rhythm.     Heart sounds: No murmur heard. Pulmonary:     Effort: Pulmonary  effort is normal. No respiratory distress.     Breath sounds: Normal breath sounds.  Abdominal:     Palpations: Abdomen is soft.     Tenderness: There is no abdominal tenderness.     Comments: Suprapubic tenderness  Musculoskeletal:        General: No swelling.     Cervical back: Neck supple.  Skin:    General: Skin is warm and dry.     Capillary Refill: Capillary refill takes less than 2 seconds.  Neurological:     Mental Status: He is alert.  Psychiatric:        Mood and Affect: Mood normal.  ED Results / Procedures / Treatments   Labs (all labs ordered are listed, but only abnormal results are displayed) Labs Reviewed  URINALYSIS, ROUTINE W REFLEX MICROSCOPIC - Abnormal; Notable for the following components:      Result Value   Color, Urine AMBER (*)    APPearance CLOUDY (*)    Protein, ur 100 (*)    Leukocytes,Ua MODERATE (*)    Bacteria, UA MANY (*)    All other components within normal limits  COMPREHENSIVE METABOLIC PANEL - Abnormal; Notable for the following components:   Glucose, Bld 194 (*)    Creatinine, Ser 1.41 (*)    GFR, Estimated 51 (*)    All other components within normal limits  URINE CULTURE  CBC WITH DIFFERENTIAL/PLATELET    EKG None  Radiology No results found.  Procedures Procedures    Medications Ordered in ED Medications  sulfamethoxazole-trimethoprim (BACTRIM DS) 800-160 MG per tablet 1 tablet (1 tablet Oral Given 12/22/23 2045)    ED Course/ Medical Decision Making/ A&P                                 Medical Decision Making Amount and/or Complexity of Data Reviewed Labs: ordered.   This patient presents to the ED for concern of dysuria.  Differential diagnosis includes catheter associated UTI, pyelonephritis, urosepsis, AKI   Lab Tests:  I Ordered, and personally interpreted labs.  The pertinent results include: UA consistent with UTI with moderate leukocytes, many bacteria, CBC unremarkable with no evident  leukocytosis, CMP shows baseline renal function with creatinine 1.41 GFR 51   Medicines ordered and prescription drug management:  I ordered medication including Bactrim for UTI Reevaluation of the patient after these medicines showed that the patient stayed the same I have reviewed the patients home medicines and have made adjustments as needed   Problem List / ED Course:  Patient with past history significant for chronic indwelling Foley catheter, diabetic neuropathy, diabetes mellitus, hypertension, COPD presents the ED with concerns of dysuria.  He reports that this is an ongoing for several weeks with associated foul-smelling urine.  Also states that his urine appears to be more cloudy in nature.  Denies any recent abdominal pain, flank pain, fever, chills or bodyaches.  No obvious blood in his urine bag that he has noted.  Denies any concerns for STIs. Physical exam is unremarkable.  No abdominal tenderness.  No flank tenderness.  Will obtain basic labs and urine for assessment of possible catheter associated UTI.  Catheter itself appears to be cloudy in nature with some clumping present along the tubing. CBC and CMP unremarkable.  UA with concerns for infection as there are leukocytes, bacteria present.  Given lack of flank pain, tenderness, doubt urolithiasis or pyelonephritis. No hemoglobin present.  Will start patient on Bactrim.  Urine culture sent off for testing.  Prior urine culture shows that patient was pansensitive so we will start on Bactrim.  Advise return precautions such as new or worsening symptoms.  Patient otherwise stable for outpatient follow-up and discharged home.  Final Clinical Impression(s) / ED Diagnoses Final diagnoses:  Urinary tract infection associated with indwelling urethral catheter, initial encounter [facility])    Rx / DC Orders ED Discharge Orders          Ordered    sulfamethoxazole-trimethoprim (BACTRIM DS) 800-160 MG tablet  2 times daily         12/22/23 2055  Smitty Knudsen, PA-C 12/22/23 2057    Elayne Snare K, DO 12/22/23 2343

## 2023-12-22 NOTE — ED Provider Triage Note (Signed)
 Emergency Medicine Provider Triage Evaluation Note  Leslie [name] , a 78 y.o. male  was evaluated in triage.  Pt complains of dysuria.  Patient reports that he began with some dysuria and some malodorous urine earlier today.  Catheter was last replaced on 11/02/2023 per patient's report.  States that he has been able to have a change.  Denies any other symptoms such as fever, fatigue, shortness of breath or chest pain.  No abdominal tenderness.  Review of Systems  Positive: As above Negative: As above  Physical Exam  BP (!) 168/91 (BP Location: Left Arm)   Pulse 92   Temp 97.7 F (36.5 C) (Oral)   Resp 18   SpO2 100%  Gen:   Awake, no distress   Resp:  Normal effort  MSK:   Moves extremities without difficulty  Other:  Urine through catheter tubing appears to be showing some clumps that appears to be cloudy.  Medical Decision Making  Medically screening exam initiated at 1:50 PM.  Appropriate orders placed.  Virgel Gess was informed that the remainder of the evaluation will be completed by another provider, this initial triage assessment does not replace that evaluation, and the importance of remaining in the ED until their evaluation is complete.     Smitty Knudsen, PA-C 12/22/23 1351

## 2023-12-25 LAB — URINE CULTURE
Culture: >=100000 — AB
Special Requests: NORMAL

## 2024-01-12 ENCOUNTER — Encounter (HOSPITAL_COMMUNITY): Payer: Self-pay | Admitting: Emergency Medicine

## 2024-01-12 ENCOUNTER — Emergency Department (HOSPITAL_COMMUNITY)
Admission: EM | Admit: 2024-01-12 | Discharge: 2024-01-12 | Disposition: A | Discharge status: Home / Self Care | Attending: Emergency Medicine | Admitting: Emergency Medicine

## 2024-01-12 DIAGNOSIS — Z466 Encounter for fitting and adjustment of urinary device: Secondary | ICD-10-CM | POA: Insufficient documentation

## 2024-01-12 DIAGNOSIS — Z7984 Long term (current) use of oral hypoglycemic drugs: Secondary | ICD-10-CM | POA: Diagnosis not present

## 2024-01-12 DIAGNOSIS — Z794 Long term (current) use of insulin: Secondary | ICD-10-CM | POA: Insufficient documentation

## 2024-01-12 DIAGNOSIS — Z79899 Other long term (current) drug therapy: Secondary | ICD-10-CM | POA: Diagnosis not present

## 2024-01-12 NOTE — ED Provider Notes (Signed)
 [place] EMERGENCY DEPARTMENT AT [facility] Provider Note   CSN: [id number] Arrival date & time: 01/12/24  1831     History  No chief complaint on file.   Leslie [name] is a 78 y.o. male, history of chronic Foley catheter use, who presents to the ED secondary to requesting, Foley catheter be changed.  He states that he is supposed to get it changed on the 25th of this month, but does not have a ride to get here, so is here to get it changed now.  Has not been having any problems with it, just states that he needs a change.  Reports normal urine flow, and good flushing of the catheter.     Home Medications Prior to Admission medications   Medication Sig Start Date End Date Taking? Authorizing Provider  acetaminophen (TYLENOL) 325 MG tablet Take 2 tablets (650 mg total) by mouth every 6 (six) hours as needed for mild pain (or Fever >/= 101). Patient taking differently: Take 650 mg by mouth every 8 (eight) hours as needed for mild pain (or Fever >/= 101). 03/30/18   Synetta Fail, MD  albuterol (PROVENTIL HFA;VENTOLIN HFA) 108 (90 Base) MCG/ACT inhaler Inhale 2 puffs into the lungs 4 (four) times daily as needed for wheezing or shortness of breath.     [provider]  albuterol (PROVENTIL) (2.5 MG/3ML) 0.083% nebulizer solution Take 2.5 mg by nebulization every 6 (six) hours as needed for wheezing or shortness of breath.    [provider]  allopurinol (ZYLOPRIM) 100 MG tablet Take 100 mg by mouth at bedtime. For gout    [provider]  AMINO ACIDS-PROTEIN HYDROLYS PO Take 1 Dose by mouth 2 (two) times daily. Add SF Pro-Stat    [provider]  amLODipine (NORVASC) 10 MG tablet Take 1 tablet (10 mg total) by mouth daily. 08/09/12   Funches, [name], MD  ascorbic acid (VITAMIN C) 1000 MG tablet Take 1 tablet (1,000 mg total) by mouth daily. 09/14/21   Doran Stabler, DO  calcium carbonate (TUMS EX) 750 MG chewable tablet Chew 1 tablet by  mouth 2 (two) times daily as needed for heartburn.    [provider]  Cholecalciferol (VITAMIN D-3) 1000 units CAPS Take 1,000 Units by mouth daily.    [provider]  clopidogrel (PLAVIX) 75 MG tablet Take 1 tablet (75 mg total) by mouth daily. 06/03/23 06/02/24  Micki Riley, MD  escitalopram (LEXAPRO) 10 MG tablet Take 10 mg by mouth daily.    [provider]  Evolocumab with Infusor (REPATHA PUSHTRONEX SYSTEM) 420 MG/3.5ML SOCT Inject 420 mg into the skin every 30 (thirty) days.    [provider]  ferrous sulfate 325 (65 FE) MG tablet Take 325 mg by mouth every other day.    [provider]  finasteride (PROSCAR) 5 MG tablet Take 1 tablet by mouth daily. 01/27/22     folic acid (FOLVITE) 1 MG tablet Take 1 tablet (1 mg total) by mouth daily. 09/14/21   Doran Stabler, DO  hydrOXYzine (ATARAX) 25 MG tablet Take 1 to 2 tablets at bedtime for sleep. 12/22/22   Mozingo, [name], NP  insulin aspart (NOVOLOG) 100 UNIT/ML injection Inject 0-9 Units into the skin 3 (three) times daily with meals. 10/07/21   Demaio, Alexa, MD  Melatonin 10 MG TABS Take 10 mg by mouth at bedtime.    [provider]  metFORMIN (GLUCOPHAGE) 850 MG tablet Take 850 mg by  mouth 2 (two) times daily with a meal.    [provider]  Multiple Vitamins-Minerals (CERTAVITE SENIOR/ANTIOXIDANT) TABS Take 1 tablet by mouth daily.    [provider]  nitroGLYCERIN (NITROSTAT) 0.4 MG SL tablet Place 0.4 mg under the tongue every 5 (five) minutes x 3 doses as needed for chest pain.  11/12/11   Funches, [name], MD  oxyCODONE (OXY IR/ROXICODONE) 5 MG immediate release tablet Take 1-2 tablets (5-10 mg total) by mouth every 4 (four) hours as needed for moderate pain (pain score 4-6). 09/13/21   Dellis Filbert, MD  oxyCODONE 10 MG TABS Take 1-1.5 tablets (10-15 mg total) by mouth every 4 (four) hours as needed for severe pain (pain score 7-10). 09/13/21   Dellis Filbert, MD  pantoprazole (PROTONIX) 40 MG tablet Take 1 tablet (40 mg total) by mouth 2 (two) times daily. 10/07/21 12/06/21  Demaio, [name], MD  pregabalin (LYRICA) 75 MG capsule Take 1 capsule (75 mg total) by mouth 2 (two) times daily. 10/24/21   Nadara Mustard, MD  pregabalin (LYRICA) 75 MG capsule Take 1 capsule (75 mg total) by mouth 2 (two) times daily. 10/24/21   Nadara Mustard, MD  senna-docusate (SENOKOT-S) 8.6-50 MG tablet Take 2 tablets by mouth daily.    [provider]  Skin Protectants, Misc. (MINERIN) CREA Apply 1 application topically 2 (two) times daily as needed (dryness).    [provider]  sucralfate (CARAFATE) 1 g tablet Take 1 tablet (1 g total) by mouth 4 (four) times daily -  with meals and at bedtime. 09/13/21   Doran Stabler, DO  tamsulosin (FLOMAX) 0.4 MG CAPS capsule Take 1 capsule by mouth at bedtime 10/31/21     traZODone (DESYREL) 50 MG tablet Take 50 mg by mouth at bedtime.    [provider]  vitamin B-12 (CYANOCOBALAMIN) 1000 MCG tablet Take 1,000 mcg by mouth daily.    [provider]      Allergies    Atorvastatin, Statins, and Flexeril [cyclobenzaprine]    Review of Systems   Review of Systems  Genitourinary:  Negative for dysuria.    Physical Exam Updated Vital Signs BP (!) 150/92   Pulse 91   Temp 98 F (36.7 C)   Resp 16   Ht 5\' 4"  (1.626 m)   Wt 90.7 kg   SpO2 95%   BMI 34.33 kg/m  Physical Exam Vitals and nursing note reviewed.  Constitutional:      General: He is not in acute distress.    Appearance: He is well-developed.  HENT:     Head: Normocephalic and atraumatic.  Eyes:     Conjunctiva/sclera: Conjunctivae normal.  Cardiovascular:     Rate and Rhythm: Normal rate and regular rhythm.     Heart sounds: No murmur heard. Pulmonary:     Effort: Pulmonary effort is normal. No respiratory distress.     Breath sounds: Normal breath sounds.  Abdominal:     Palpations: Abdomen is soft.      Tenderness: There is no abdominal tenderness.  Musculoskeletal:        General: No swelling.     Cervical back: Neck supple.  Skin:    General: Skin is warm and dry.     Capillary Refill: Capillary refill takes less than 2 seconds.  Neurological:     Mental Status: He is alert.  Psychiatric:        Mood and Affect: Mood normal.     ED Results /  Procedures / Treatments   Labs (all labs ordered are listed, but only abnormal results are displayed) Labs Reviewed - No data to display  EKG None  Radiology No results found.  Procedures Procedures    Medications Ordered in ED Medications - No data to display  ED Course/ Medical Decision Making/ A&P                                 Medical Decision Making Patient here for Foley catheter change, Foley catheter changed, discharged home.  Having no urinary symptoms, thus no urinalysis obtained.  Discharged home with strict return precautions, and Foley catheter care information    Final Clinical Impression(s) / ED Diagnoses Final diagnoses:  Encounter for Foley catheter replacement    Rx / DC Orders ED Discharge Orders     None         Baya Lentz, Harley [place], PA 01/12/24 2124    Royanne Foots, DO 01/18/24 1610

## 2024-01-12 NOTE — Discharge Instructions (Signed)
 We replaced your Foley catheter, please use the bag care, as instructed.  Follow-up with your primary care doctor as needed.

## 2024-01-12 NOTE — ED Triage Notes (Signed)
 Patient comes in pov requesting urinary catheter change. Last time changed per patient was when he was at they hospital.

## 2024-03-24 ENCOUNTER — Emergency Department (HOSPITAL_COMMUNITY)
Admission: EM | Admit: 2024-03-24 | Discharge: 2024-03-24 | Disposition: A | Discharge status: Home / Self Care | Attending: Emergency Medicine | Admitting: Emergency Medicine

## 2024-03-24 ENCOUNTER — Other Ambulatory Visit: Payer: Self-pay

## 2024-03-24 DIAGNOSIS — Y828 Other medical devices associated with adverse incidents: Secondary | ICD-10-CM | POA: Diagnosis not present

## 2024-03-24 DIAGNOSIS — Z79899 Other long term (current) drug therapy: Secondary | ICD-10-CM | POA: Diagnosis not present

## 2024-03-24 DIAGNOSIS — Z7984 Long term (current) use of oral hypoglycemic drugs: Secondary | ICD-10-CM | POA: Diagnosis not present

## 2024-03-24 DIAGNOSIS — Z7902 Long term (current) use of antithrombotics/antiplatelets: Secondary | ICD-10-CM | POA: Insufficient documentation

## 2024-03-24 DIAGNOSIS — T83091A Other mechanical complication of indwelling urethral catheter, initial encounter: Secondary | ICD-10-CM | POA: Insufficient documentation

## 2024-03-24 DIAGNOSIS — T839XXA Unspecified complication of genitourinary prosthetic device, implant and graft, initial encounter: Secondary | ICD-10-CM

## 2024-03-24 LAB — URINALYSIS, ROUTINE W REFLEX MICROSCOPIC
Bilirubin Urine: NEGATIVE
Glucose, UA: NEGATIVE mg/dL
Ketones, ur: NEGATIVE mg/dL
Nitrite: POSITIVE — AB
Protein, ur: 100 mg/dL — AB
RBC / HPF: >50 RBC/hpf (ref 0–5)
Specific Gravity, Urine: 1.01 (ref 1.005–1.030)
WBC, UA: >50 WBC/hpf (ref 0–5)
pH: 5 (ref 5.0–8.0)

## 2024-03-24 LAB — CBC WITH DIFFERENTIAL/PLATELET
Abs Immature Granulocytes: 0.08 10*3/uL — ABNORMAL HIGH (ref 0.00–0.07)
Basophils Absolute: 0 10*3/uL (ref 0.0–0.1)
Basophils Relative: 0 %
Eosinophils Absolute: 0 10*3/uL (ref 0.0–0.5)
Eosinophils Relative: 0 %
HCT: 35.8 % — ABNORMAL LOW (ref 39.0–52.0)
Hemoglobin: 12.4 g/dL — ABNORMAL LOW (ref 13.0–17.0)
Immature Granulocytes: 1 %
Lymphocytes Relative: 14 %
Lymphs Abs: 2.1 10*3/uL (ref 0.7–4.0)
MCH: 28.6 pg (ref 26.0–34.0)
MCHC: 34.6 g/dL (ref 30.0–36.0)
MCV: 82.5 fL (ref 80.0–100.0)
Monocytes Absolute: 1 10*3/uL (ref 0.1–1.0)
Monocytes Relative: 7 %
Neutro Abs: 11.4 10*3/uL — ABNORMAL HIGH (ref 1.7–7.7)
Neutrophils Relative %: 78 %
Platelets: 246 10*3/uL (ref 150–400)
RBC: 4.34 MIL/uL (ref 4.22–5.81)
RDW: 12.7 % (ref 11.5–15.5)
WBC: 14.7 10*3/uL — ABNORMAL HIGH (ref 4.0–10.5)
nRBC: 0 % (ref 0.0–0.2)

## 2024-03-24 LAB — BASIC METABOLIC PANEL WITH GFR
Anion gap: 11 (ref 5–15)
BUN: 14 mg/dL (ref 8–23)
CO2: 22 mmol/L (ref 22–32)
Calcium: 8.1 mg/dL — ABNORMAL LOW (ref 8.9–10.3)
Chloride: 95 mmol/L — ABNORMAL LOW (ref 98–111)
Creatinine, Ser: 1.42 mg/dL — ABNORMAL HIGH (ref 0.61–1.24)
GFR, Estimated: 51 mL/min — ABNORMAL LOW (ref >60)
Glucose, Bld: 143 mg/dL — ABNORMAL HIGH (ref 70–99)
Potassium: 3.8 mmol/L (ref 3.5–5.1)
Sodium: 128 mmol/L — ABNORMAL LOW (ref 135–145)

## 2024-03-24 MED ORDER — CEPHALEXIN 500 MG PO CAPS
500.0000 mg | ORAL_CAPSULE | Freq: Four times a day (QID) | ORAL | 0 refills | Status: AC
  Filled 2024-03-24: qty 28, 7d supply, fill #0

## 2024-03-24 MED ORDER — CEPHALEXIN 500 MG PO CAPS
500.0000 mg | ORAL_CAPSULE | Freq: Once | ORAL | Status: AC
  Administered 2024-03-24: 500 mg via ORAL
  Filled 2024-03-24: qty 1

## 2024-03-24 NOTE — ED Notes (Signed)
 [facility] called

## 2024-03-24 NOTE — ED Provider Notes (Signed)
[place] EMERGENCY DEPARTMENT AT [facility] Provider Note   CSN: [id number] Arrival date & time: 03/24/24  1816     History  No chief complaint on file.   Leslie [name] is a 78 y.o. male.  78 year old male with prior medical history as detailed below presents for evaluation.  Patient reports that his chronic Foley catheter got caught on an object 4 days ago.  It pulled out.  He presents tonight requesting reinsertion of Foley catheter.  He reports that his catheter had been in place for approximately 7 weeks prior to the accidental removal of it 4 days ago.  He reports that since the catheter has been out he has had frequent dribbling urination.  He denies pain.  He denies fever.  The history is provided by the patient.       Home Medications Prior to Admission medications   Medication Sig Start Date End Date Taking? Authorizing Provider  acetaminophen  (TYLENOL ) 325 MG tablet Take 2 tablets (650 mg total) by mouth every 6 (six) hours as needed for mild pain (or Fever >/= 101). Patient taking differently: Take 650 mg by mouth every 8 (eight) hours as needed for mild pain (or Fever >/= 101). 03/30/18   Johnetta Nab, MD  albuterol  (PROVENTIL  HFA;VENTOLIN  HFA) 108 (90 Base) MCG/ACT inhaler Inhale 2 puffs into the lungs 4 (four) times daily as needed for wheezing or shortness of breath.     [provider]  albuterol  (PROVENTIL ) (2.5 MG/3ML) 0.083% nebulizer solution Take 2.5 mg by nebulization every 6 (six) hours as needed for wheezing or shortness of breath.    [provider]  allopurinol  (ZYLOPRIM ) 100 MG tablet Take 100 mg by mouth at bedtime. For gout    [provider]  AMINO ACIDS-PROTEIN HYDROLYS PO Take 1 Dose by mouth 2 (two) times daily. Add SF Pro-Stat    [provider]  amLODipine  (NORVASC ) 10 MG tablet Take 1 tablet (10 mg total) by mouth daily. 08/09/12   Funches, Josalyn, MD  ascorbic acid  (VITAMIN C) 1000 MG tablet  Take 1 tablet (1,000 mg total) by mouth daily. 09/14/21   Nguyen, Quan, DO  calcium  carbonate (TUMS EX) 750 MG chewable tablet Chew 1 tablet by mouth 2 (two) times daily as needed for heartburn.    [provider]  Cholecalciferol  (VITAMIN D -3) 1000 units CAPS Take 1,000 Units by mouth daily.    [provider]  clopidogrel  (PLAVIX ) 75 MG tablet Take 1 tablet (75 mg total) by mouth daily. 06/03/23 06/02/24  Lisabeth Rider, MD  escitalopram  (LEXAPRO ) 10 MG tablet Take 10 mg by mouth daily.    [provider]  Evolocumab with Infusor (REPATHA PUSHTRONEX SYSTEM) 420 MG/3.5ML SOCT Inject 420 mg into the skin every 30 (thirty) days.    [provider]  ferrous sulfate 325 (65 FE) MG tablet Take 325 mg by mouth every other day.    [provider]  finasteride  (PROSCAR ) 5 MG tablet Take 1 tablet by mouth daily. 01/27/22     folic acid  (FOLVITE ) 1 MG tablet Take 1 tablet (1 mg total) by mouth daily. 09/14/21   Nguyen, Quan, DO  hydrOXYzine  (ATARAX ) 25 MG tablet Take 1 to 2 tablets at bedtime for sleep. 12/22/22   Mozingo, Regina Nattalie, NP  insulin  aspart (NOVOLOG ) 100 UNIT/ML injection Inject 0-9 Units into the skin 3 (three) times daily with meals. 10/07/21   Demaio, Alexa, MD  Melatonin 10 MG TABS Take 10  mg by mouth at bedtime.    [provider]  metFORMIN  (GLUCOPHAGE ) 850 MG tablet Take 850 mg by mouth 2 (two) times daily with a meal.    [provider]  Multiple Vitamins-Minerals (CERTAVITE SENIOR/ANTIOXIDANT) TABS Take 1 tablet by mouth daily.    [provider]  nitroGLYCERIN  (NITROSTAT ) 0.4 MG SL tablet Place 0.4 mg under the tongue every 5 (five) minutes x 3 doses as needed for chest pain.  11/12/11   Funches, Josalyn, MD  oxyCODONE  (OXY IR/ROXICODONE ) 5 MG immediate release tablet Take 1-2 tablets (5-10 mg total) by mouth every 4 (four) hours as needed for moderate pain (pain score 4-6). 09/13/21   Ariwodo, Shirley, MD  oxyCODONE   10 MG TABS Take 1-1.5 tablets (10-15 mg total) by mouth every 4 (four) hours as needed for severe pain (pain score 7-10). 09/13/21   Ariwodo, Shirley, MD  pantoprazole  (PROTONIX ) 40 MG tablet Take 1 tablet (40 mg total) by mouth 2 (two) times daily. 10/07/21 12/06/21  Demaio, Alexa, MD  pregabalin  (LYRICA ) 75 MG capsule Take 1 capsule (75 mg total) by mouth 2 (two) times daily. 10/24/21   Timothy Ford, MD  pregabalin  (LYRICA ) 75 MG capsule Take 1 capsule (75 mg total) by mouth 2 (two) times daily. 10/24/21   Timothy Ford, MD  senna-docusate (SENOKOT-S) 8.6-50 MG tablet Take 2 tablets by mouth daily.    [provider]  Skin Protectants, Misc. (MINERIN) CREA Apply 1 application topically 2 (two) times daily as needed (dryness).    [provider]  sucralfate  (CARAFATE ) 1 g tablet Take 1 tablet (1 g total) by mouth 4 (four) times daily -  with meals and at bedtime. 09/13/21   Nguyen, Quan, DO  tamsulosin  (FLOMAX ) 0.4 MG CAPS capsule Take 1 capsule by mouth at bedtime 10/31/21     traZODone  (DESYREL ) 50 MG tablet Take 50 mg by mouth at bedtime.    [provider]  vitamin B-12 (CYANOCOBALAMIN) 1000 MCG tablet Take 1,000 mcg by mouth daily.    [provider]      Allergies    Atorvastatin , Statins, and Flexeril  [cyclobenzaprine ]    Review of Systems   Review of Systems  All other systems reviewed and are negative.   Physical Exam Updated Vital Signs BP (!) 175/101   Pulse (!) 103   Temp 99.9 F (37.7 C)   Resp 16   Ht 5\' 4"  (1.626 m)   Wt 90.7 kg   SpO2 100%   BMI 34.32 kg/m  Physical Exam Vitals and nursing note reviewed.  Constitutional:      General: He is not in acute distress.    Appearance: Normal appearance. He is well-developed.  HENT:     Head: Normocephalic and atraumatic.  Eyes:     Conjunctiva/sclera: Conjunctivae normal.     Pupils: Pupils are equal, round, and reactive to light.  Cardiovascular:     Rate and Rhythm: Normal  rate and regular rhythm.     Heart sounds: Normal heart sounds.  Pulmonary:     Effort: Pulmonary effort is normal. No respiratory distress.     Breath sounds: Normal breath sounds.  Abdominal:     General: There is no distension.     Palpations: Abdomen is soft.     Tenderness: There is no abdominal tenderness.  Musculoskeletal:        General: No deformity. Normal range of motion.     Cervical back: Normal range of motion and  neck supple.     Comments: Status post bilateral above-the-knee amputations.  Skin:    General: Skin is warm and dry.  Neurological:     General: No focal deficit present.     Mental Status: He is alert and oriented to person, place, and time.     ED Results / Procedures / Treatments   Labs (all labs ordered are listed, but only abnormal results are displayed) Labs Reviewed  BASIC METABOLIC PANEL WITH GFR - Abnormal; Notable for the following components:      Result Value   Sodium 128 (*)    Chloride 95 (*)    Glucose, Bld 143 (*)    Creatinine, Ser 1.42 (*)    Calcium  8.1 (*)    GFR, Estimated 51 (*)    All other components within normal limits  CBC WITH DIFFERENTIAL/PLATELET - Abnormal; Notable for the following components:   WBC 14.7 (*)    Hemoglobin 12.4 (*)    HCT 35.8 (*)    Neutro Abs 11.4 (*)    Abs Immature Granulocytes 0.08 (*)    All other components within normal limits  URINALYSIS, ROUTINE W REFLEX MICROSCOPIC    EKG None  Radiology No results found.  Procedures Procedures    Medications Ordered in ED Medications  cephALEXin  (KEFLEX ) capsule 500 mg (has no administration in time range)    ED Course/ Medical Decision Making/ A&P                                 Medical Decision Making Risk Prescription drug management.    Medical Screen Complete  This patient presented to the ED with complaint of Foley catheter problem.  This complaint involves an extensive number of treatment options. The initial  differential diagnosis includes, but is not limited to, accidental removal of catheter  This presentation is: Acute, Chronic, Self-Limited, Previously Undiagnosed, Uncertain Prognosis, Complicated, Systemic Symptoms, and Threat to Life/Bodily Function  Patient reports that he has had a catheter in for multiple weeks.  It was accidentally pulled out 4 days ago.  He presents requesting replacement of Foley catheter. He is without other complaint.  He is nontoxic in appearance.  Foley catheter replaced without difficulty.  Screening labs obtained are without significant acute abnormality.  Given need to replace catheter after traumatic removal, course of Keflex  prescribed to treat possible early urinary tract infection.  Importance of close follow-up is stressed.  Strict return precautions given and understood.    Additional history obtained:   External records from outside sources obtained and reviewed including prior ED visits and prior Inpatient records.     Problem List / ED Course:  Foley Catheter replacement   Reevaluation:  After the interventions noted above, I reevaluated the patient and found that they have: improved  Disposition:  After consideration of the diagnostic results and the patients response to treatment, I feel that the patent would benefit from close outpatient followup.          Final Clinical Impression(s) / ED Diagnoses Final diagnoses:  Problem with Foley catheter, initial encounter [facility])    Rx / DC Orders ED Discharge Orders          Ordered    cephALEXin  (KEFLEX ) 500 MG capsule  4 times daily        03/24/24 2238              Burnette Carte, MD  03/24/24 2242  

## 2024-03-24 NOTE — Discharge Instructions (Signed)
 Return for any problem.  ?

## 2024-03-24 NOTE — ED Provider Triage Note (Signed)
 Emergency Medicine Provider Triage Evaluation Note  Leslie [name] , a 78 y.o. male  was evaluated in triage.  Pt complains of urinary incontinence since removing a Foley catheter a few days ago.  Patient states his Foley catheter got caught in an object in the bathroom and was pulled out.  Patient denies any dysuria hematuria nausea vomiting abdominal pain fevers but states that he cannot stop urinating.  Patient has any back pain.  Patient is here because he believes he has a UTI.  Review of Systems  Positive:  Negative:   Physical Exam  There were no vitals taken for this visit. Gen:   Awake, no distress   Resp:  Normal effort  MSK:   Moves extremities without difficulty  Other:    Medical Decision Making  Medically screening exam initiated at 6:26 PM.  Appropriate orders placed.  Ozzie Board was informed that the remainder of the evaluation will be completed by another provider, this initial triage assessment does not replace that evaluation, and the importance of remaining in the ED until their evaluation is complete.  Workup initiated, patient stable.   Denese Finn, PA-C 03/24/24 1827

## 2024-03-24 NOTE — ED Triage Notes (Signed)
 Pt brought in by ems for dislodging and removing catheter 3 days ago patient has complaints of possible uti.

## 2024-03-25 ENCOUNTER — Other Ambulatory Visit (HOSPITAL_COMMUNITY): Payer: Self-pay

## 2024-04-12 ENCOUNTER — Other Ambulatory Visit: Payer: Self-pay

## 2024-04-12 ENCOUNTER — Emergency Department (HOSPITAL_COMMUNITY)
Admission: EM | Admit: 2024-04-12 | Discharge: 2024-04-12 | Disposition: A | Discharge status: Home / Self Care | Attending: Emergency Medicine | Admitting: Emergency Medicine

## 2024-04-12 ENCOUNTER — Other Ambulatory Visit (HOSPITAL_COMMUNITY): Payer: Self-pay

## 2024-04-12 DIAGNOSIS — Z79899 Other long term (current) drug therapy: Secondary | ICD-10-CM | POA: Diagnosis not present

## 2024-04-12 DIAGNOSIS — Z794 Long term (current) use of insulin: Secondary | ICD-10-CM | POA: Insufficient documentation

## 2024-04-12 DIAGNOSIS — E119 Type 2 diabetes mellitus without complications: Secondary | ICD-10-CM | POA: Insufficient documentation

## 2024-04-12 DIAGNOSIS — Z7984 Long term (current) use of oral hypoglycemic drugs: Secondary | ICD-10-CM | POA: Diagnosis not present

## 2024-04-12 DIAGNOSIS — J449 Chronic obstructive pulmonary disease, unspecified: Secondary | ICD-10-CM | POA: Diagnosis not present

## 2024-04-12 DIAGNOSIS — Z7902 Long term (current) use of antithrombotics/antiplatelets: Secondary | ICD-10-CM | POA: Insufficient documentation

## 2024-04-12 DIAGNOSIS — N390 Urinary tract infection, site not specified: Secondary | ICD-10-CM | POA: Diagnosis not present

## 2024-04-12 DIAGNOSIS — Y732 Prosthetic and other implants, materials and accessory gastroenterology and urology devices associated with adverse incidents: Secondary | ICD-10-CM | POA: Diagnosis not present

## 2024-04-12 DIAGNOSIS — T839XXA Unspecified complication of genitourinary prosthetic device, implant and graft, initial encounter: Secondary | ICD-10-CM

## 2024-04-12 DIAGNOSIS — T83098A Other mechanical complication of other indwelling urethral catheter, initial encounter: Secondary | ICD-10-CM | POA: Diagnosis present

## 2024-04-12 DIAGNOSIS — I10 Essential (primary) hypertension: Secondary | ICD-10-CM | POA: Diagnosis not present

## 2024-04-12 DIAGNOSIS — T83511A Infection and inflammatory reaction due to indwelling urethral catheter, initial encounter: Secondary | ICD-10-CM | POA: Diagnosis not present

## 2024-04-12 LAB — URINALYSIS, W/ REFLEX TO CULTURE (INFECTION SUSPECTED)
Bilirubin Urine: NEGATIVE
Glucose, UA: NEGATIVE mg/dL
Ketones, ur: NEGATIVE mg/dL
Nitrite: POSITIVE — AB
Protein, ur: 100 mg/dL — AB
RBC / HPF: >50 RBC/hpf (ref 0–5)
Specific Gravity, Urine: 1.013 (ref 1.005–1.030)
WBC, UA: >50 WBC/hpf (ref 0–5)
pH: 5 (ref 5.0–8.0)

## 2024-04-12 MED ORDER — CIPROFLOXACIN HCL 500 MG PO TABS
500.0000 mg | ORAL_TABLET | Freq: Two times a day (BID) | ORAL | 0 refills | Status: AC
  Filled 2024-04-12: qty 14, 7d supply, fill #0

## 2024-04-12 MED ORDER — CIPROFLOXACIN HCL 500 MG PO TABS
500.0000 mg | ORAL_TABLET | Freq: Once | ORAL | Status: AC
  Administered 2024-04-12: 500 mg via ORAL
  Filled 2024-04-12: qty 1

## 2024-04-12 NOTE — ED Notes (Signed)
Patient left via [facility]. 

## 2024-04-12 NOTE — ED Notes (Signed)
 Had medication delivered from [facility] long pharmacy for patient to go home with.

## 2024-04-12 NOTE — ED Triage Notes (Signed)
 Pt presents to the ED via EMS with complaints of dislodged foley cath. Pt states that when he urinates some urine comes out around the cath. Pt states it slid out this past week and he pushed it back in himself and it was doing fine but now it is leaking again.   EMS Vitals: AOx4 122/82 87 20 96 %RA

## 2024-04-12 NOTE — ED Notes (Signed)
[facility] contacted for patient transport 

## 2024-04-12 NOTE — Discharge Instructions (Addendum)
 Please reach out to your insurance company [employer] or [place]) for medical transportation OR let the doctor office know that you do not have transportation, and sometimes they provide transportation.

## 2024-04-12 NOTE — ED Provider Notes (Signed)
 [place] EMERGENCY DEPARTMENT AT [facility] Provider Note   CSN: [id number] Arrival date & time: 04/12/24  2130     Patient presents with: Foley Cath Issue   Leslie [name] is a 78 y.o. male.   Pt is a 78 yo male with pmhx significant for chronic foley use, asthma, GERD, HLD, HTN, DM2, COPD, peripheral artery disease s/p BLE amputations.  Pt said his foley catheter came out partially a few days ago.  He put it back in and it was ok for a few days.  It is now leaking around the catheter.  He denies any f/c.  Pt had seen Dr. Inga Manges at [facility], but has not seen him since September 2024.  He's been coming here for his foley issues.  The last time it was changed was here on 5/29.  Pt said he has difficulty with transportation.  His pcp provides transportation, but he has no way to get to other places.       Prior to Admission medications   Medication Sig Start Date End Date Taking? Authorizing Provider  ciprofloxacin  (CIPRO ) 500 MG tablet Take 1 tablet (500 mg total) by mouth 2 (two) times daily. 04/12/24  Yes Sueellen Emery, MD  acetaminophen  (TYLENOL ) 325 MG tablet Take 2 tablets (650 mg total) by mouth every 6 (six) hours as needed for mild pain (or Fever >/= 101). Patient taking differently: Take 650 mg by mouth every 8 (eight) hours as needed for mild pain (or Fever >/= 101). 03/30/18   Johnetta Nab, MD  albuterol  (PROVENTIL  HFA;VENTOLIN  HFA) 108 (90 Base) MCG/ACT inhaler Inhale 2 puffs into the lungs 4 (four) times daily as needed for wheezing or shortness of breath.     [provider]  albuterol  (PROVENTIL ) (2.5 MG/3ML) 0.083% nebulizer solution Take 2.5 mg by nebulization every 6 (six) hours as needed for wheezing or shortness of breath.    [provider]  allopurinol  (ZYLOPRIM ) 100 MG tablet Take 100 mg by mouth at bedtime. For gout    [provider]  AMINO ACIDS-PROTEIN HYDROLYS PO Take 1 Dose by mouth 2 (two) times daily. Add  SF Pro-Stat    [provider]  amLODipine  (NORVASC ) 10 MG tablet Take 1 tablet (10 mg total) by mouth daily. 08/09/12   Funches, Josalyn, MD  ascorbic acid  (VITAMIN C) 1000 MG tablet Take 1 tablet (1,000 mg total) by mouth daily. 09/14/21   Nguyen, Quan, DO  calcium  carbonate (TUMS EX) 750 MG chewable tablet Chew 1 tablet by mouth 2 (two) times daily as needed for heartburn.    [provider]  cephALEXin  (KEFLEX ) 500 MG capsule Take 1 capsule (500 mg total) by mouth 4 (four) times daily. 03/24/24   Burnette Carte, MD  Cholecalciferol  (VITAMIN D -3) 1000 units CAPS Take 1,000 Units by mouth daily.    [provider]  clopidogrel  (PLAVIX ) 75 MG tablet Take 1 tablet (75 mg total) by mouth daily. 06/03/23 06/02/24  Lisabeth Rider, MD  escitalopram  (LEXAPRO ) 10 MG tablet Take 10 mg by mouth daily.    [provider]  Evolocumab with Infusor (REPATHA PUSHTRONEX SYSTEM) 420 MG/3.5ML SOCT Inject 420 mg into the skin every 30 (thirty) days.    [provider]  ferrous sulfate 325 (65 FE) MG tablet Take 325 mg by mouth every other day.    [provider]  finasteride  (PROSCAR ) 5 MG tablet Take 1 tablet by mouth daily. 01/27/22  folic acid  (FOLVITE ) 1 MG tablet Take 1 tablet (1 mg total) by mouth daily. 09/14/21   Nguyen, Quan, DO  hydrOXYzine  (ATARAX ) 25 MG tablet Take 1 to 2 tablets at bedtime for sleep. 12/22/22   Mozingo, Regina Nattalie, NP  insulin  aspart (NOVOLOG ) 100 UNIT/ML injection Inject 0-9 Units into the skin 3 (three) times daily with meals. 10/07/21   Demaio, Alexa, MD  Melatonin 10 MG TABS Take 10 mg by mouth at bedtime.    [provider]  metFORMIN  (GLUCOPHAGE ) 850 MG tablet Take 850 mg by mouth 2 (two) times daily with a meal.    [provider]  Multiple Vitamins-Minerals (CERTAVITE SENIOR/ANTIOXIDANT) TABS Take 1 tablet by mouth daily.    [provider]  nitroGLYCERIN  (NITROSTAT ) 0.4 MG SL tablet Place 0.4 mg  under the tongue every 5 (five) minutes x 3 doses as needed for chest pain.  11/12/11   Funches, Josalyn, MD  oxyCODONE  (OXY IR/ROXICODONE ) 5 MG immediate release tablet Take 1-2 tablets (5-10 mg total) by mouth every 4 (four) hours as needed for moderate pain (pain score 4-6). 09/13/21   Ariwodo, Shirley, MD  oxyCODONE  10 MG TABS Take 1-1.5 tablets (10-15 mg total) by mouth every 4 (four) hours as needed for severe pain (pain score 7-10). 09/13/21   Ariwodo, Shirley, MD  pantoprazole  (PROTONIX ) 40 MG tablet Take 1 tablet (40 mg total) by mouth 2 (two) times daily. 10/07/21 12/06/21  Demaio, Alexa, MD  pregabalin  (LYRICA ) 75 MG capsule Take 1 capsule (75 mg total) by mouth 2 (two) times daily. 10/24/21   Timothy Ford, MD  pregabalin  (LYRICA ) 75 MG capsule Take 1 capsule (75 mg total) by mouth 2 (two) times daily. 10/24/21   Timothy Ford, MD  senna-docusate (SENOKOT-S) 8.6-50 MG tablet Take 2 tablets by mouth daily.    [provider]  Skin Protectants, Misc. (MINERIN) CREA Apply 1 application topically 2 (two) times daily as needed (dryness).    [provider]  sucralfate  (CARAFATE ) 1 g tablet Take 1 tablet (1 g total) by mouth 4 (four) times daily -  with meals and at bedtime. 09/13/21   Nguyen, Quan, DO  tamsulosin  (FLOMAX ) 0.4 MG CAPS capsule Take 1 capsule by mouth at bedtime 10/31/21     traZODone  (DESYREL ) 50 MG tablet Take 50 mg by mouth at bedtime.    [provider]  vitamin B-12 (CYANOCOBALAMIN) 1000 MCG tablet Take 1,000 mcg by mouth daily.    [provider]    Allergies: Atorvastatin , Statins, and Flexeril  [cyclobenzaprine ]    Review of Systems  Genitourinary:        Foley issue  All other systems reviewed and are negative.   Updated Vital Signs BP (!) 146/94 (BP Location: Left Arm)   Pulse 83   Temp 97.8 F (36.6 C) (Oral)   Resp 20   SpO2 98%   Physical Exam Vitals and nursing note reviewed.  Constitutional:      Appearance: Normal  appearance. He is obese.  HENT:     Head: Normocephalic and atraumatic.     Right Ear: External ear normal.     Left Ear: External ear normal.     Nose: Nose normal.     Mouth/Throat:     Mouth: Mucous membranes are moist.     Pharynx: Oropharynx is clear.   Eyes:     Extraocular Movements: Extraocular movements intact.     Conjunctiva/sclera: Conjunctivae normal.     Pupils: Pupils  are equal, round, and reactive to light.    Cardiovascular:     Rate and Rhythm: Normal rate and regular rhythm.     Pulses: Normal pulses.     Heart sounds: Normal heart sounds.  Pulmonary:     Effort: Pulmonary effort is normal.     Breath sounds: Normal breath sounds.  Abdominal:     General: Abdomen is flat.     Palpations: Abdomen is soft.  Genitourinary:    Comments: Foley in place.  Urine cloudy with sediment.  Musculoskeletal:     Cervical back: Normal range of motion and neck supple.     Comments: Bilateral BKAs   Skin:    General: Skin is warm.     Capillary Refill: Capillary refill takes less than 2 seconds.   Neurological:     General: No focal deficit present.     Mental Status: He is alert and oriented to person, place, and time.   Psychiatric:        Mood and Affect: Mood normal.        Behavior: Behavior normal.     (all labs ordered are listed, but only abnormal results are displayed) Labs Reviewed  URINALYSIS, W/ REFLEX TO CULTURE (INFECTION SUSPECTED) - Abnormal; Notable for the following components:      Result Value   APPearance TURBID (*)    Hgb urine dipstick MODERATE (*)    Protein, ur 100 (*)    Nitrite POSITIVE (*)    Leukocytes,Ua MODERATE (*)    Bacteria, UA MANY (*)    All other components within normal limits  URINE CULTURE    EKG: None  Radiology: No results found.   Procedures   Medications Ordered in the ED  ciprofloxacin  (CIPRO ) tablet 500 mg (500 mg Oral Given 04/12/24 1032)                                    Medical Decision  Making Amount and/or Complexity of Data Reviewed Labs: ordered.  Risk Prescription drug management.   This patient presents to the ED for concern of foley issue, this involves an extensive number of treatment options, and is a complaint that carries with it a high risk of complications and morbidity.  The differential diagnosis includes foley malfunction, uti   Co morbidities that complicate the patient evaluation  chronic foley use, asthma, GERD, HLD, HTN, DM2, COPD, peripheral artery disease s/p BLE amputations   Additional history obtained:  Additional history obtained from epic chart review External records from outside source obtained and reviewed including EMS report   Lab Tests:  I Ordered, and personally interpreted labs.  The pertinent results include:  UA + for UTI.     Medicines ordered and prescription drug management:  I ordered medication including cipro   for sx  Reevaluation of the patient after these medicines showed that the patient improved I have reviewed the patients home medicines and have made adjustments as needed    Problem List / ED Course:  Foley cath problem:  nursing replaced UTI:  Urine sent from new foley.  Last cx in Feb grew out Klebsiella sensitive to everything but ampicillin.  I will start him on cipro . Transportation issues:  I have consulted toc to see if they can help with transportation.   Reevaluation:  After the interventions noted above, I reevaluated the patient and found that they have :improved  Social Determinants of Health:  Lives alone   Dispostion:  After consideration of the diagnostic results and the patients response to treatment, I feel that the patent would benefit from discharge with outpatient f/u.       Final diagnoses:  Problem with Foley catheter, initial encounter [facility])  Urinary tract infection associated with indwelling urethral catheter, initial encounter [facility])    ED Discharge Orders           Ordered    ciprofloxacin  (CIPRO ) 500 MG tablet  2 times daily        04/12/24 1039               Sueellen Emery, MD 04/12/24 1040

## 2024-04-12 NOTE — Discharge Planning (Signed)
 RNCM consulted regarding pt not having medical transportation to scheduled doctor visits.  Pt is insured and should reach out to his insurance company for medical transportation OR he can let the office know, and sometimes they provide transportation. He also has a Medicaid plan that provides medical transportation  ([employer]) to and from medical visits.

## 2024-04-14 LAB — URINE CULTURE: Culture: >=100000 — AB

## 2024-04-15 ENCOUNTER — Telehealth (HOSPITAL_BASED_OUTPATIENT_CLINIC_OR_DEPARTMENT_OTHER): Payer: Self-pay | Admitting: *Deleted

## 2024-04-15 NOTE — Telephone Encounter (Signed)
 Post ED Visit - Positive Culture Follow-up  Culture report reviewed by antimicrobial stewardship pharmacist: [facility] []  Court Distance, Pharm.D. []  Skeet Duke, Pharm.D., BCPS AQ-ID []  Leslee Rase, Pharm.D., BCPS []  Garland Junk, Pharm.D., BCPS []  [place], [address].D., BCPS, AAHIVP []  Alcide Aly, Pharm.D., BCPS, AAHIVP []  Jerri Morale, PharmD, BCPS []  Graham Laws, PharmD, BCPS []  Cleda Curly, PharmD, BCPS []  Tamar Fairly, PharmD []  Ballard Levels, PharmD, BCPS []  Ollen Beverage, PharmD  [facility] [x]  Denson Flake PharmD []  Sherryle Don, PharmD []  Van Gelinas, PharmD []  Delila Felty, Rph []  [place]) Cleora Daft, PharmD []  Augustina Block, PharmD []  Arie Kurtz, PharmD []  Sharlyn Deaner, PharmD []  Agnes Hose, PharmD []  Kendall Pauls, PharmD []  Gladstone Lamer, PharmD []  Armanda Bern, PharmD []  Tera Fellows, PharmD   Positive urine culture Treated with Ciprofloxacin , organism sensitive to the same and no further patient follow-up is required at this time.  Jessee Mormon 04/15/2024, 11:56 AM

## 2024-06-28 ENCOUNTER — Emergency Department (HOSPITAL_COMMUNITY): Admission: EM | Admit: 2024-06-28 | Discharge: 2024-06-28 | Discharge status: Against Medical Advice

## 2024-06-28 NOTE — ED Notes (Signed)
 Pt checked in to ED by mistake. Pt came in for cathter bag change only. Pt reportedly ran over his bag with his wheelchair and accidentally got a hole in it. Pt dismissed from ED after given clean cathter bag.

## 2024-07-15 ENCOUNTER — Emergency Department (HOSPITAL_COMMUNITY)
Admission: EM | Admit: 2024-07-15 | Discharge: 2024-07-15 | Disposition: A | Discharge status: Home / Self Care | Attending: Emergency Medicine | Admitting: Emergency Medicine

## 2024-07-15 ENCOUNTER — Other Ambulatory Visit (HOSPITAL_COMMUNITY): Payer: Self-pay

## 2024-07-15 DIAGNOSIS — E119 Type 2 diabetes mellitus without complications: Secondary | ICD-10-CM | POA: Insufficient documentation

## 2024-07-15 DIAGNOSIS — T83511A Infection and inflammatory reaction due to indwelling urethral catheter, initial encounter: Secondary | ICD-10-CM | POA: Diagnosis not present

## 2024-07-15 DIAGNOSIS — Y732 Prosthetic and other implants, materials and accessory gastroenterology and urology devices associated with adverse incidents: Secondary | ICD-10-CM | POA: Insufficient documentation

## 2024-07-15 DIAGNOSIS — R300 Dysuria: Secondary | ICD-10-CM | POA: Insufficient documentation

## 2024-07-15 DIAGNOSIS — J449 Chronic obstructive pulmonary disease, unspecified: Secondary | ICD-10-CM | POA: Insufficient documentation

## 2024-07-15 DIAGNOSIS — Z794 Long term (current) use of insulin: Secondary | ICD-10-CM | POA: Insufficient documentation

## 2024-07-15 DIAGNOSIS — Z7984 Long term (current) use of oral hypoglycemic drugs: Secondary | ICD-10-CM | POA: Diagnosis not present

## 2024-07-15 DIAGNOSIS — Z79899 Other long term (current) drug therapy: Secondary | ICD-10-CM | POA: Diagnosis not present

## 2024-07-15 DIAGNOSIS — I10 Essential (primary) hypertension: Secondary | ICD-10-CM | POA: Insufficient documentation

## 2024-07-15 DIAGNOSIS — N390 Urinary tract infection, site not specified: Secondary | ICD-10-CM

## 2024-07-15 LAB — CBC WITH DIFFERENTIAL/PLATELET
Abs Immature Granulocytes: 0.05 K/uL (ref 0.00–0.07)
Basophils Absolute: 0 K/uL (ref 0.0–0.1)
Basophils Relative: 0 %
Eosinophils Absolute: 0 K/uL (ref 0.0–0.5)
Eosinophils Relative: 0 %
HCT: 41.8 % (ref 39.0–52.0)
Hemoglobin: 14.2 g/dL (ref 13.0–17.0)
Immature Granulocytes: 1 %
Lymphocytes Relative: 29 %
Lymphs Abs: 2.7 K/uL (ref 0.7–4.0)
MCH: 29.1 pg (ref 26.0–34.0)
MCHC: 34 g/dL (ref 30.0–36.0)
MCV: 85.7 fL (ref 80.0–100.0)
Monocytes Absolute: 0.8 K/uL (ref 0.1–1.0)
Monocytes Relative: 8 %
Neutro Abs: 5.9 K/uL (ref 1.7–7.7)
Neutrophils Relative %: 62 %
Platelets: 181 K/uL (ref 150–400)
RBC: 4.88 MIL/uL (ref 4.22–5.81)
RDW: 14.1 % (ref 11.5–15.5)
WBC: 9.5 K/uL (ref 4.0–10.5)
nRBC: 0 % (ref 0.0–0.2)

## 2024-07-15 LAB — URINALYSIS, W/ REFLEX TO CULTURE (INFECTION SUSPECTED)
Bilirubin Urine: NEGATIVE
Glucose, UA: NEGATIVE mg/dL
Ketones, ur: NEGATIVE mg/dL
Nitrite: NEGATIVE
Protein, ur: 100 mg/dL — AB
RBC / HPF: >50 RBC/hpf (ref 0–5)
Specific Gravity, Urine: 1.01 (ref 1.005–1.030)
WBC, UA: >50 WBC/hpf (ref 0–5)
pH: 6 (ref 5.0–8.0)

## 2024-07-15 LAB — BASIC METABOLIC PANEL WITH GFR
Anion gap: 18 — ABNORMAL HIGH (ref 5–15)
BUN: 19 mg/dL (ref 8–23)
CO2: 20 mmol/L — ABNORMAL LOW (ref 22–32)
Calcium: 9.6 mg/dL (ref 8.9–10.3)
Chloride: 96 mmol/L — ABNORMAL LOW (ref 98–111)
Creatinine, Ser: 1.27 mg/dL — ABNORMAL HIGH (ref 0.61–1.24)
GFR, Estimated: 58 mL/min — ABNORMAL LOW (ref >60)
Glucose, Bld: 96 mg/dL (ref 70–99)
Potassium: 4.1 mmol/L (ref 3.5–5.1)
Sodium: 135 mmol/L (ref 135–145)

## 2024-07-15 MED ORDER — MORPHINE SULFATE (PF) 4 MG/ML IV SOLN
4.0000 mg | Freq: Once | INTRAVENOUS | Status: AC
  Administered 2024-07-15: 4 mg via INTRAVENOUS
  Filled 2024-07-15: qty 1

## 2024-07-15 MED ORDER — LIDOCAINE HCL URETHRAL/MUCOSAL 2 % EX GEL
1.0000 | Freq: Once | CUTANEOUS | Status: AC
  Administered 2024-07-15: 1 via URETHRAL
  Filled 2024-07-15: qty 11

## 2024-07-15 MED ORDER — PHENAZOPYRIDINE HCL 200 MG PO TABS
200.0000 mg | ORAL_TABLET | Freq: Three times a day (TID) | ORAL | 0 refills | Status: AC
  Filled 2024-07-15: qty 6, 2d supply, fill #0

## 2024-07-15 MED ORDER — CIPROFLOXACIN HCL 500 MG PO TABS
500.0000 mg | ORAL_TABLET | Freq: Once | ORAL | Status: AC
Start: 2024-07-15 — End: 2024-07-15
  Administered 2024-07-15: 500 mg via ORAL
  Filled 2024-07-15: qty 1

## 2024-07-15 MED ORDER — PHENAZOPYRIDINE HCL 200 MG PO TABS
200.0000 mg | ORAL_TABLET | Freq: Three times a day (TID) | ORAL | Status: DC
  Administered 2024-07-15: 200 mg via ORAL
  Filled 2024-07-15: qty 1

## 2024-07-15 MED ORDER — CIPROFLOXACIN HCL 500 MG PO TABS
500.0000 mg | ORAL_TABLET | Freq: Two times a day (BID) | ORAL | 0 refills | Status: AC
  Filled 2024-07-15: qty 14, 7d supply, fill #0

## 2024-07-15 MED ORDER — ONDANSETRON HCL 4 MG/2ML IJ SOLN
4.0000 mg | Freq: Once | INTRAMUSCULAR | Status: AC
  Administered 2024-07-15: 4 mg via INTRAVENOUS
  Filled 2024-07-15: qty 2

## 2024-07-15 MED ORDER — NYSTATIN 100000 UNIT/GM EX POWD
Freq: Once | CUTANEOUS | Status: AC
  Filled 2024-07-15: qty 15

## 2024-07-15 NOTE — ED Triage Notes (Signed)
 Per EMS from home. C/o burning around catheter insertion site since this morning. Hx of UTIs.  HR 56 RR 16 BP 160/90 96 on RA CBG 149 97.2 T

## 2024-07-15 NOTE — Progress Notes (Signed)
 Discharge meds in a secure bag delivered to patient's nurse in ED

## 2024-07-15 NOTE — ED Notes (Signed)
 [facility] called

## 2024-07-15 NOTE — ED Provider Notes (Signed)
 [place] EMERGENCY DEPARTMENT AT [facility] Provider Note   CSN: [id number] Arrival date & time: 07/15/24  1119     Patient presents with: Dysuria   Leslie [name] is a 78 y.o. male.   Pt is a 78 yo male with pmhx significant for asthma, GERD, HLD, HTN, neuropathy, DM2, COPD, s/p bilateral BKAs due to peripheral artery disease and chronic foley use.  Pt does not go to urology for his catheter care as he has difficulty with transportation; he usually comes here when he has a problem.  The last time his catheter was changed was when he was here last on 6/17.  He called EMS today because he has a lot of pain in his catheter.  He thinks he has a UTI.       Prior to Admission medications   Medication Sig Start Date End Date Taking? Authorizing Provider  ciprofloxacin  (CIPRO ) 500 MG tablet Take 1 tablet (500 mg total) by mouth 2 (two) times daily. 07/15/24  Yes Dean Clarity, MD  phenazopyridine  (PYRIDIUM ) 200 MG tablet Take 1 tablet (200 mg total) by mouth 3 (three) times daily. 07/15/24  Yes Dean Clarity, MD  acetaminophen  (TYLENOL ) 325 MG tablet Take 2 tablets (650 mg total) by mouth every 6 (six) hours as needed for mild pain (or Fever >/= 101). Patient taking differently: Take 650 mg by mouth every 8 (eight) hours as needed for mild pain (or Fever >/= 101). 03/30/18   Melvin, Alexander B, MD  albuterol  (PROVENTIL  HFA;VENTOLIN  HFA) [phone] Base) MCG/ACT inhaler Inhale 2 puffs into the lungs 4 (four) times daily as needed for wheezing or shortness of breath.     [provider]  albuterol  (PROVENTIL ) (2.5 MG/3ML) 0.083% nebulizer solution Take 2.5 mg by nebulization every 6 (six) hours as needed for wheezing or shortness of breath.    [provider]  allopurinol  (ZYLOPRIM ) 100 MG tablet Take 100 mg by mouth at bedtime. For gout    [provider]  AMINO ACIDS-PROTEIN HYDROLYS PO Take 1 Dose by mouth 2 (two) times daily. Add SF Pro-Stat     [provider]  amLODipine  (NORVASC ) 10 MG tablet Take 1 tablet (10 mg total) by mouth daily. 08/09/12   Funches, Josalyn, MD  ascorbic acid  (VITAMIN C) 1000 MG tablet Take 1 tablet (1,000 mg total) by mouth daily. 09/14/21   Nguyen, Quan, DO  calcium  carbonate (TUMS EX) 750 MG chewable tablet Chew 1 tablet by mouth 2 (two) times daily as needed for heartburn.    [provider]  cephALEXin  (KEFLEX ) 500 MG capsule Take 1 capsule (500 mg total) by mouth 4 (four) times daily. 03/24/24   Laurice Maude BROCKS, MD  Cholecalciferol  (VITAMIN D -3) 1000 units CAPS Take 1,000 Units by mouth daily.    [provider]  ciprofloxacin  (CIPRO ) 500 MG tablet Take 1 tablet (500 mg total) by mouth 2 (two) times daily. 04/12/24   Dean Clarity, MD  escitalopram  (LEXAPRO ) 10 MG tablet Take 10 mg by mouth daily.    [provider]  Evolocumab with Infusor (REPATHA PUSHTRONEX SYSTEM) 420 MG/3.5ML SOCT Inject 420 mg into the skin every 30 (thirty) days.    [provider]  ferrous sulfate 325 (65 FE) MG tablet Take 325 mg by mouth every other day.    [provider]  finasteride  (PROSCAR ) 5 MG tablet Take 1 tablet by mouth daily. 01/27/22     folic acid  (FOLVITE ) 1 MG tablet Take 1  tablet (1 mg total) by mouth daily. 09/14/21   Nguyen, Quan, DO  hydrOXYzine  (ATARAX ) 25 MG tablet Take 1 to 2 tablets at bedtime for sleep. 12/22/22   Mozingo, Regina Nattalie, NP  insulin  aspart (NOVOLOG ) 100 UNIT/ML injection Inject 0-9 Units into the skin 3 (three) times daily with meals. 10/07/21   Demaio, Alexa, MD  Melatonin 10 MG TABS Take 10 mg by mouth at bedtime.    [provider]  metFORMIN  (GLUCOPHAGE ) 850 MG tablet Take 850 mg by mouth 2 (two) times daily with a meal.    [provider]  Multiple Vitamins-Minerals (CERTAVITE SENIOR/ANTIOXIDANT) TABS Take 1 tablet by mouth daily.    [provider]  nitroGLYCERIN  (NITROSTAT ) 0.4 MG SL tablet Place 0.4 mg  under the tongue every 5 (five) minutes x 3 doses as needed for chest pain.  11/12/11   Funches, Josalyn, MD  oxyCODONE  (OXY IR/ROXICODONE ) 5 MG immediate release tablet Take 1-2 tablets (5-10 mg total) by mouth every 4 (four) hours as needed for moderate pain (pain score 4-6). 09/13/21   Ariwodo, Shirley, MD  oxyCODONE  10 MG TABS Take 1-1.5 tablets (10-15 mg total) by mouth every 4 (four) hours as needed for severe pain (pain score 7-10). 09/13/21   Ariwodo, Shirley, MD  pantoprazole  (PROTONIX ) 40 MG tablet Take 1 tablet (40 mg total) by mouth 2 (two) times daily. 10/07/21 12/06/21  Demaio, Alexa, MD  pregabalin  (LYRICA ) 75 MG capsule Take 1 capsule (75 mg total) by mouth 2 (two) times daily. 10/24/21   Harden Jerona GAILS, MD  pregabalin  (LYRICA ) 75 MG capsule Take 1 capsule (75 mg total) by mouth 2 (two) times daily. 10/24/21   Harden Jerona GAILS, MD  senna-docusate (SENOKOT-S) 8.6-50 MG tablet Take 2 tablets by mouth daily.    [provider]  Skin Protectants, Misc. (MINERIN) CREA Apply 1 application topically 2 (two) times daily as needed (dryness).    [provider]  sucralfate  (CARAFATE ) 1 g tablet Take 1 tablet (1 g total) by mouth 4 (four) times daily -  with meals and at bedtime. 09/13/21   Nguyen, Quan, DO  tamsulosin  (FLOMAX ) 0.4 MG CAPS capsule Take 1 capsule by mouth at bedtime 10/31/21     traZODone  (DESYREL ) 50 MG tablet Take 50 mg by mouth at bedtime.    [provider]  vitamin B-12 (CYANOCOBALAMIN) 1000 MCG tablet Take 1,000 mcg by mouth daily.    [provider]    Allergies: Atorvastatin , Statins, and Flexeril  [cyclobenzaprine ]    Review of Systems  Genitourinary:  Positive for dysuria.  All other systems reviewed and are negative.   Updated Vital Signs BP (!) 148/70 (BP Location: Right Arm)   Pulse 75   Temp 98.4 F (36.9 C) (Oral)   Resp 17   SpO2 99%   Physical Exam Vitals and nursing note reviewed.  Constitutional:      Appearance:  Normal appearance.  HENT:     Head: Normocephalic and atraumatic.     Right Ear: External ear normal.     Left Ear: External ear normal.     Nose: Nose normal.     Mouth/Throat:     Mouth: Mucous membranes are moist.     Pharynx: Oropharynx is clear.  Eyes:     Extraocular Movements: Extraocular movements intact.     Conjunctiva/sclera: Conjunctivae normal.     Pupils: Pupils are equal, round, and reactive to light.  Cardiovascular:     Rate and Rhythm: Normal  rate and regular rhythm.     Pulses: Normal pulses.     Heart sounds: Normal heart sounds.  Pulmonary:     Effort: Pulmonary effort is normal.     Breath sounds: Normal breath sounds.  Abdominal:     General: Abdomen is flat. Bowel sounds are normal.     Palpations: Abdomen is soft.     Tenderness: There is abdominal tenderness in the suprapubic area.  Musculoskeletal:     Cervical back: Normal range of motion and neck supple.     Comments: Bilateral BKAs  Skin:    General: Skin is warm.     Capillary Refill: Capillary refill takes less than 2 seconds.  Neurological:     General: No focal deficit present.     Mental Status: He is alert and oriented to person, place, and time.  Psychiatric:        Mood and Affect: Mood normal.        Behavior: Behavior normal.     (all labs ordered are listed, but only abnormal results are displayed) Labs Reviewed  URINALYSIS, W/ REFLEX TO CULTURE (INFECTION SUSPECTED) - Abnormal; Notable for the following components:      Result Value   APPearance HAZY (*)    Hgb urine dipstick LARGE (*)    Protein, ur 100 (*)    Leukocytes,Ua MODERATE (*)    Bacteria, UA FEW (*)    All other components within normal limits  BASIC METABOLIC PANEL WITH GFR - Abnormal; Notable for the following components:   Chloride 96 (*)    CO2 20 (*)    Creatinine, Ser 1.27 (*)    GFR, Estimated 58 (*)    Anion gap 18 (*)    All other components within normal limits  URINE CULTURE  CBC WITH  DIFFERENTIAL/PLATELET    EKG: None  Radiology: No results found.   Procedures   Medications Ordered in the ED  phenazopyridine  (PYRIDIUM ) tablet 200 mg (200 mg Oral Given by Other 07/15/24 1211)  ciprofloxacin  (CIPRO ) tablet 500 mg (has no administration in time range)  lidocaine  (XYLOCAINE ) 2 % jelly 1 Application (1 Application Urethral Given 07/15/24 1229)  morphine  (PF) 4 MG/ML injection 4 mg (4 mg Intravenous Given 07/15/24 1212)  ondansetron  (ZOFRAN ) injection 4 mg (4 mg Intravenous Given 07/15/24 1212)  nystatin  (MYCOSTATIN /NYSTOP ) topical powder ( Topical Given 07/15/24 1347)                                    Medical Decision Making Amount and/or Complexity of Data Reviewed Labs: ordered.  Risk Prescription drug management.   This patient presents to the ED for concern of dysuria, this involves an extensive number of treatment options, and is a complaint that carries with it a high risk of complications and morbidity.  The differential diagnosis includes uti, fc malfunction   Co morbidities that complicate the patient evaluation  asthma, GERD, HLD, HTN, neuropathy, DM2, COPD, s/p bilateral BKAs due to peripheral artery disease and chronic foley use   Additional history obtained:  Additional history obtained from epic chart review External records from outside source obtained and reviewed including EMS report   Lab Tests:  I Ordered, and personally interpreted labs.  The pertinent results include:  cbc nl, bmp nl other than cr sl elevated at 1.27 (stable); UA + for UTI   Medicines ordered and prescription drug management:  I ordered  medication including morphine /zofran /pyridium   for sx  Reevaluation of the patient after these medicines showed that the patient improved I have reviewed the patients home medicines and have made adjustments as needed  Critical Interventions:  Foley change/abx   Problem List / ED Course:  UTI associated with indwelling  FC:  last urine sensitive to cipro .  I spoke with [facility] pharmacy and they will deliver abx to pt's room.  Pt is stable for d/c.  Return if worse.     Reevaluation:  After the interventions noted above, I reevaluated the patient and found that they have :improved   Social Determinants of Health:  Lives at home   Dispostion:  After consideration of the diagnostic results and the patients response to treatment, I feel that the patent would benefit from discharge with outpatient f/u.       Final diagnoses:  Urinary tract infection associated with indwelling urethral catheter, initial encounter [facility])    ED Discharge Orders          Ordered    phenazopyridine  (PYRIDIUM ) 200 MG tablet  3 times daily        07/15/24 1508    ciprofloxacin  (CIPRO ) 500 MG tablet  2 times daily        07/15/24 1508               Dean Clarity, MD 07/15/24 1511

## 2024-07-19 ENCOUNTER — Emergency Department (HOSPITAL_COMMUNITY)
Admission: EM | Admit: 2024-07-19 | Discharge: 2024-07-19 | Disposition: A | Discharge status: Home / Self Care | Attending: Emergency Medicine | Admitting: Emergency Medicine

## 2024-07-19 DIAGNOSIS — T839XXA Unspecified complication of genitourinary prosthetic device, implant and graft, initial encounter: Secondary | ICD-10-CM

## 2024-07-19 DIAGNOSIS — Z7984 Long term (current) use of oral hypoglycemic drugs: Secondary | ICD-10-CM | POA: Insufficient documentation

## 2024-07-19 DIAGNOSIS — J449 Chronic obstructive pulmonary disease, unspecified: Secondary | ICD-10-CM | POA: Diagnosis not present

## 2024-07-19 DIAGNOSIS — T83031A Leakage of indwelling urethral catheter, initial encounter: Secondary | ICD-10-CM | POA: Insufficient documentation

## 2024-07-19 DIAGNOSIS — Z79899 Other long term (current) drug therapy: Secondary | ICD-10-CM | POA: Diagnosis not present

## 2024-07-19 DIAGNOSIS — E119 Type 2 diabetes mellitus without complications: Secondary | ICD-10-CM | POA: Insufficient documentation

## 2024-07-19 DIAGNOSIS — Z794 Long term (current) use of insulin: Secondary | ICD-10-CM | POA: Insufficient documentation

## 2024-07-19 DIAGNOSIS — I10 Essential (primary) hypertension: Secondary | ICD-10-CM | POA: Insufficient documentation

## 2024-07-19 DIAGNOSIS — Y732 Prosthetic and other implants, materials and accessory gastroenterology and urology devices associated with adverse incidents: Secondary | ICD-10-CM | POA: Diagnosis not present

## 2024-07-19 LAB — URINALYSIS, W/ REFLEX TO CULTURE (INFECTION SUSPECTED)
Bilirubin Urine: NEGATIVE
Bilirubin Urine: NEGATIVE
Glucose, UA: NEGATIVE mg/dL
Glucose, UA: NEGATIVE mg/dL
Hgb urine dipstick: NEGATIVE
Ketones, ur: NEGATIVE mg/dL
Ketones, ur: NEGATIVE mg/dL
Nitrite: POSITIVE — AB
Nitrite: POSITIVE — AB
Protein, ur: 100 mg/dL — AB
Protein, ur: 100 mg/dL — AB
RBC / HPF: >50 RBC/hpf (ref 0–5)
Specific Gravity, Urine: 1.012 (ref 1.005–1.030)
Specific Gravity, Urine: 1.015 (ref 1.005–1.030)
pH: 6 (ref 5.0–8.0)
pH: 5 (ref 5.0–8.0)

## 2024-07-19 LAB — CBC WITH DIFFERENTIAL/PLATELET
Abs Immature Granulocytes: 0.03 K/uL (ref 0.00–0.07)
Basophils Absolute: 0 K/uL (ref 0.0–0.1)
Basophils Relative: 0 %
Eosinophils Absolute: 0.1 K/uL (ref 0.0–0.5)
Eosinophils Relative: 1 %
HCT: 38.5 % — ABNORMAL LOW (ref 39.0–52.0)
Hemoglobin: 13.1 g/dL (ref 13.0–17.0)
Immature Granulocytes: 0 %
Lymphocytes Relative: 25 %
Lymphs Abs: 2 K/uL (ref 0.7–4.0)
MCH: 29.5 pg (ref 26.0–34.0)
MCHC: 34 g/dL (ref 30.0–36.0)
MCV: 86.7 fL (ref 80.0–100.0)
Monocytes Absolute: 0.7 K/uL (ref 0.1–1.0)
Monocytes Relative: 9 %
Neutro Abs: 5 K/uL (ref 1.7–7.7)
Neutrophils Relative %: 65 %
Platelets: 153 K/uL (ref 150–400)
RBC: 4.44 MIL/uL (ref 4.22–5.81)
RDW: 14.2 % (ref 11.5–15.5)
WBC: 7.8 K/uL (ref 4.0–10.5)
nRBC: 0 % (ref 0.0–0.2)

## 2024-07-19 LAB — I-STAT CHEM 8, ED
BUN: 16 mg/dL (ref 8–23)
Calcium, Ion: 1.14 mmol/L — ABNORMAL LOW (ref 1.15–1.40)
Chloride: 102 mmol/L (ref 98–111)
Creatinine, Ser: 1.4 mg/dL — ABNORMAL HIGH (ref 0.61–1.24)
Glucose, Bld: 129 mg/dL — ABNORMAL HIGH (ref 70–99)
HCT: 39 % (ref 39.0–52.0)
Hemoglobin: 13.3 g/dL (ref 13.0–17.0)
Potassium: 3.9 mmol/L (ref 3.5–5.1)
Sodium: 137 mmol/L (ref 135–145)
TCO2: 23 mmol/L (ref 22–32)

## 2024-07-19 LAB — URINE CULTURE: Culture: >=100000 — AB

## 2024-07-19 MED ORDER — LIDOCAINE HCL URETHRAL/MUCOSAL 2 % EX GEL
1.0000 | Freq: Once | CUTANEOUS | Status: AC
  Administered 2024-07-19: 1 via URETHRAL
  Filled 2024-07-19: qty 11

## 2024-07-19 NOTE — ED Provider Notes (Signed)
 [place] EMERGENCY DEPARTMENT AT [facility] Provider Note   CSN: [id number] Arrival date & time: 07/19/24  9244     History Chief Complaint  Patient presents with   Leaking Foley    HPI: Leslie [name] is a 78 y.o. male with history pertinent for asthma, GERD, HLD, HTN, neuropathy, DM2, COPD, s/p bilateral BKAs due to peripheral artery disease and chronic foley use who presents complaining of leaking around his Foley catheter. History provided by patient.  No interpreter required during this encounter.  Patient reports that he woke this morning and noted that his Foley catheter was leaking.  Reports that the urine is coming from his penis around the Foley catheter.  Reports that it was most recently exchanged 4 days ago in the emergency department, and was initiated on Cipro .  Denies any abdominal pain, nausea, vomiting, fever, chills.  Patient's recorded medical, surgical, social, medication list and allergies were reviewed in the Snapshot window as part of the initial history.   Prior to Admission medications   Medication Sig Start Date End Date Taking? Authorizing Provider  acetaminophen  (TYLENOL ) 325 MG tablet Take 2 tablets (650 mg total) by mouth every 6 (six) hours as needed for mild pain (or Fever >/= 101). Patient taking differently: Take 650 mg by mouth every 8 (eight) hours as needed for mild pain (or Fever >/= 101). 03/30/18   Melvin, Alexander B, MD  albuterol  (PROVENTIL  HFA;VENTOLIN  HFA) 108 (90 Base) MCG/ACT inhaler Inhale 2 puffs into the lungs 4 (four) times daily as needed for wheezing or shortness of breath.     [provider]  albuterol  (PROVENTIL ) (2.5 MG/3ML) 0.083% nebulizer solution Take 2.5 mg by nebulization every 6 (six) hours as needed for wheezing or shortness of breath.    [provider]  allopurinol  (ZYLOPRIM ) 100 MG tablet Take 100 mg by mouth at bedtime. For gout    [provider]  AMINO ACIDS-PROTEIN HYDROLYS PO  Take 1 Dose by mouth 2 (two) times daily. Add SF Pro-Stat    [provider]  amLODipine  (NORVASC ) 10 MG tablet Take 1 tablet (10 mg total) by mouth daily. 08/09/12   Funches, Josalyn, MD  ascorbic acid  (VITAMIN C) 1000 MG tablet Take 1 tablet (1,000 mg total) by mouth daily. 09/14/21   Nguyen, Quan, DO  calcium  carbonate (TUMS EX) 750 MG chewable tablet Chew 1 tablet by mouth 2 (two) times daily as needed for heartburn.    [provider]  cephALEXin  (KEFLEX ) 500 MG capsule Take 1 capsule (500 mg total) by mouth 4 (four) times daily. 03/24/24   Laurice Maude BROCKS, MD  Cholecalciferol  (VITAMIN D -3) 1000 units CAPS Take 1,000 Units by mouth daily.    [provider]  ciprofloxacin  (CIPRO ) 500 MG tablet Take 1 tablet (500 mg total) by mouth 2 (two) times daily. 04/12/24   Dean Clarity, MD  ciprofloxacin  (CIPRO ) 500 MG tablet Take 1 tablet (500 mg total) by mouth 2 (two) times daily. 07/15/24   Dean Clarity, MD  escitalopram  (LEXAPRO ) 10 MG tablet Take 10 mg by mouth daily.    [provider]  Evolocumab with Infusor (REPATHA PUSHTRONEX SYSTEM) 420 MG/3.5ML SOCT Inject 420 mg into the skin every 30 (thirty) days.    [provider]  ferrous sulfate 325 (65 FE) MG tablet Take 325 mg by mouth every other day.    [provider]  finasteride  (PROSCAR ) 5 MG tablet Take 1 tablet by mouth daily. 01/27/22  folic acid  (FOLVITE ) 1 MG tablet Take 1 tablet (1 mg total) by mouth daily. 09/14/21   Nguyen, Quan, DO  hydrOXYzine  (ATARAX ) 25 MG tablet Take 1 to 2 tablets at bedtime for sleep. 12/22/22   Mozingo, Regina Nattalie, NP  insulin  aspart (NOVOLOG ) 100 UNIT/ML injection Inject 0-9 Units into the skin 3 (three) times daily with meals. 10/07/21   Demaio, Alexa, MD  Melatonin 10 MG TABS Take 10 mg by mouth at bedtime.    [provider]  metFORMIN  (GLUCOPHAGE ) 850 MG tablet Take 850 mg by mouth 2 (two) times daily with a meal.    [provider]  Multiple Vitamins-Minerals (CERTAVITE SENIOR/ANTIOXIDANT) TABS Take 1 tablet by mouth daily.    [provider]  nitroGLYCERIN  (NITROSTAT ) 0.4 MG SL tablet Place 0.4 mg under the tongue every 5 (five) minutes x 3 doses as needed for chest pain.  11/12/11   Funches, Josalyn, MD  oxyCODONE  (OXY IR/ROXICODONE ) 5 MG immediate release tablet Take 1-2 tablets (5-10 mg total) by mouth every 4 (four) hours as needed for moderate pain (pain score 4-6). 09/13/21   Ariwodo, Shirley, MD  oxyCODONE  10 MG TABS Take 1-1.5 tablets (10-15 mg total) by mouth every 4 (four) hours as needed for severe pain (pain score 7-10). 09/13/21   Ariwodo, Shirley, MD  pantoprazole  (PROTONIX ) 40 MG tablet Take 1 tablet (40 mg total) by mouth 2 (two) times daily. 10/07/21 12/06/21  Demaio, Alexa, MD  phenazopyridine  (PYRIDIUM ) 200 MG tablet Take 1 tablet (200 mg total) by mouth 3 (three) times daily. 07/15/24   Dean Clarity, MD  pregabalin  (LYRICA ) 75 MG capsule Take 1 capsule (75 mg total) by mouth 2 (two) times daily. 10/24/21   Harden Jerona GAILS, MD  pregabalin  (LYRICA ) 75 MG capsule Take 1 capsule (75 mg total) by mouth 2 (two) times daily. 10/24/21   Harden Jerona GAILS, MD  senna-docusate (SENOKOT-S) 8.6-50 MG tablet Take 2 tablets by mouth daily.    [provider]  Skin Protectants, Misc. (MINERIN) CREA Apply 1 application topically 2 (two) times daily as needed (dryness).    [provider]  sucralfate  (CARAFATE ) 1 g tablet Take 1 tablet (1 g total) by mouth 4 (four) times daily -  with meals and at bedtime. 09/13/21   Nguyen, Quan, DO  tamsulosin  (FLOMAX ) 0.4 MG CAPS capsule Take 1 capsule by mouth at bedtime 10/31/21     traZODone  (DESYREL ) 50 MG tablet Take 50 mg by mouth at bedtime.    [provider]  vitamin B-12 (CYANOCOBALAMIN) 1000 MCG tablet Take 1,000 mcg by mouth daily.    [provider]     Allergies: Atorvastatin , Statins, and Flexeril  [cyclobenzaprine ]    Review of Systems   ROS as per HPI  Physical Exam Updated Vital Signs BP (!) 166/81   Pulse 86   Temp 97.8 F (36.6 C) (Oral)   Resp 18   SpO2 97%  Physical Exam Vitals and nursing note reviewed.  Constitutional:      General: He is not in acute distress.    Appearance: Normal appearance. He is well-developed.     Comments: Urine soaked Tshirt  HENT:     Head: Normocephalic and atraumatic.  Eyes:     Extraocular Movements: Extraocular movements intact.     Conjunctiva/sclera: Conjunctivae normal.  Cardiovascular:     Rate and Rhythm: Normal rate and regular rhythm.     Pulses: Normal pulses.     Heart sounds: No murmur  heard. Pulmonary:     Effort: Pulmonary effort is normal. No respiratory distress.     Breath sounds: Normal breath sounds.  Abdominal:     Palpations: Abdomen is soft. There is no mass.     Tenderness: There is no abdominal tenderness. There is no guarding or rebound.  Musculoskeletal:     Cervical back: Neck supple.     Comments: Status post bilateral lower extremity amputations  Skin:    General: Skin is warm and dry.     Capillary Refill: Capillary refill takes less than 2 seconds.  Neurological:     General: No focal deficit present.     Mental Status: He is alert and oriented to person, place, and time.  Psychiatric:        Mood and Affect: Mood normal.     ED Course/ Medical Decision Making/ A&P    Procedures Procedures   Medications Ordered in ED Medications  lidocaine  (XYLOCAINE ) 2 % jelly 1 Application (1 Application Urethral Given 07/19/24 0941)    Medical Decision Making:   Leslie [name] is a 78 y.o. male who presents for Foley leakage as per above.  Physical exam is pertinent for urine leaking onto clothing despite 16 French Foley catheter in place.   The differential includes but is not limited to UTI, Foley complication, need for upsize of Foley catheter.  Independent historian: None  External data reviewed: Notes:  Reviewed prior ED note  Labs: Ordered, Independent interpretation, and Details: Chem-8 without emergent electrolyte derangement, creatinine elevated but at baseline per patient.  CBC without leukocytosis, anemia, thrombocytopenia.  Precatheter exchange urine with possible UTI, post catheter exchange urine pending at the time of discharge  Radiology: Not indicated No results found.  EKG/Medicine tests: Not indicated EKG Interpretation:                  Interventions: Uro-Jet  See the EMR for full details regarding lab and imaging results.  Patient presents for leakage around his Foley catheter, discussed upsizing of Foley catheter to 42 [place], this was performed with cessation of leakage.  Patient without AKI on labs, patient has recently been diagnosed with UTI, however reports clinical improvement, is afebrile, hemodynamically stable and without leukocytosis on labs.  Patient requested discharge prior to urine being obtained from freshly placed Foley catheter, discussed that we are thus unable to evaluate for clearance of his UTI on his UA.  However given patient reports clinical improvement, does not have systemic symptoms of infection, no leukocytosis, I feel that it is reasonable for patient to be discharged on current course of antibiotics.  Patient to follow-up with PCP.  Presentation is most consistent with acute complicated illness and Current presentation is complicated by underlying chronic conditions  Discussion of management or test interpretations with external provider(s): Not indicated  Risk Drugs:Prescription drug management  Disposition: DISCHARGE: I believe that the patient is safe for discharge home with outpatient follow-up. Patient was informed of all pertinent physical exam, laboratory, and imaging findings. Patient's suspected etiology of their symptom presentation was discussed with the patient and all questions were answered. We discussed following up with PCP. I  provided thorough ED return precautions. The patient feels safe and comfortable with this plan.  MDM generated using voice dictation software and may contain dictation errors.  Please contact me for any clarification or with any questions.  Clinical Impression:  1. Problem with Foley catheter, initial encounter      Discharge   Final Clinical  Impression(s) / ED Diagnoses Final diagnoses:  Problem with Foley catheter, initial encounter    Rx / DC Orders ED Discharge Orders     None        Rogelia Jerilynn RAMAN, MD 07/19/24 [phone]

## 2024-07-19 NOTE — ED Triage Notes (Signed)
 Per EMS from home. Leaking foley with unusually colored urine. C/o burning at insertion site. Hx of hypertension and currently on antibiotics.  BP 150/80 HR 78 98 on RA 193 CBG

## 2024-07-19 NOTE — Discharge Instructions (Signed)
 Leslie [name]  Thank you for allowing us  to take care of you today.  You came to the Emergency Department today because you are leaking urine around your Foley catheter.  We exchanged it for a larger Foley catheter and you were no longer having leakage.  You did recently have a diagnosis and a urinary tract infection you were prescribed Cipro .  We repeated your urine test here, however you would like to be discharged before we get the results of this.  If it shows a infection that is not susceptible to Cipro , you will get a callback for a different antibiotic, however in the meantime please continue taking your Cipro , and follow-up with your primary care doctor  To-Do: 1. Please follow-up with your primary doctor within 2 days / as soon as possible.   Please return to the Emergency Department or call 911 if you experience have worsening of your symptoms, or do not get better, chest pain, shortness of breath, severe or significantly worsening pain, high fever, severe confusion, pass out or have any reason to think that you need emergency medical care.   We hope you feel better soon.   [name] Later, MD Department of Emergency Medicine [facility]

## 2024-07-20 ENCOUNTER — Telehealth (HOSPITAL_BASED_OUTPATIENT_CLINIC_OR_DEPARTMENT_OTHER): Payer: Self-pay | Admitting: *Deleted

## 2024-07-20 LAB — URINE CULTURE: Culture: <10000 — AB

## 2024-07-20 NOTE — Telephone Encounter (Signed)
 Post ED Visit - Positive Culture Follow-up  Culture report reviewed by antimicrobial stewardship pharmacist: [facility] [x]  Vanna Labi, Pharm.D. []  Venetia Gully, Pharm.D., BCPS AQ-ID []  Garrel Crews, Pharm.D., BCPS []  Almarie Lunger, Pharm.D., BCPS []  [place], [address].D., BCPS, AAHIVP []  Rosaline Bihari, Pharm.D., BCPS, AAHIVP []  Vernell Meier, PharmD, BCPS []  Latanya Hint, PharmD, BCPS []  Kendrell Medley, PharmD, BCPS []  Rocky Bold, PharmD []  Dorothyann Alert, PharmD, BCPS []  Morene Babe, PharmD  [facility] []  Rosaline Edison, PharmD []  Romona Bliss, PharmD []  Dolphus Roller, PharmD []  Veva Seip, Rph []  Vernell Daunt) Leonce, PharmD []  Eva Allis, PharmD []  Rosaline Millet, PharmD []  Iantha Batch, PharmD []  Arvin Gauss, PharmD []  Wanda Hasting, PharmD []  Ronal Rav, PharmD []  Rocky Slade, PharmD []  Bard Jeans, PharmD   Positive urine culture Treated with ciprofloxacin , organism sensitive to the same and no further patient follow-up is required at this time.  Lorita Barnie Pereyra 07/20/2024, 11:29 AM

## 2024-07-20 NOTE — Progress Notes (Signed)
 ED Antimicrobial Stewardship Positive Culture Follow Up   Leslie [name] is an 78 y.o. male who presented to [facility] on 07/15/2024 with a chief complaint of dysuria and burning sensation around catheter insertion site last changed 6/17. WBC was normal and afebrile. 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 Patient came back to ED 07/19/2024 with urine leaking out of catheter and was upsized to a larger catheter to stop leaking. No reported urinary symptoms at this time.   Chief Complaint  Patient presents with   Dysuria   Recent Results (from the past 720 hours)  Urine Culture     Status: Abnormal   Collection Time: 07/15/24  1:00 PM   Specimen: Urine, Random  Result Value Ref Range Status   Specimen Description   Final    URINE, RANDOM Performed at [facility], [address] [place], [place] [address]    Special Requests   Final    NONE Reflexed from [id number] Performed at [facility], [address] [place], [place] [address]    Culture (A)  Final    >=100,000 COLONIES/mL ACINETOBACTER CALCOACETICUS/BAUMANNII COMPLEX 80,000 COLONIES/mL ENTEROBACTER SPECIES    Report Status 07/19/2024 FINAL  Final   Organism ID, Bacteria ACINETOBACTER CALCOACETICUS/BAUMANNII COMPLEX (A)  Final   Organism ID, Bacteria ENTEROBACTER SPECIES (A)  Final      Susceptibility   Acinetobacter calcoaceticus/baumannii complex - MIC*    PIP/TAZO Value in next row Sensitive      <=4 SENSITIVEThis is a modified FDA-approved test that has been validated and its performance characteristics determined by the reporting laboratory.  This laboratory is certified under the Clinical Laboratory Improvement Amendments CLIA as qualified to perform high complexity clinical laboratory testing.    AMPICILLIN/SULBACTAM Value in next row Sensitive      <=4 SENSITIVEThis is a modified FDA-approved test that has been validated and its performance  characteristics determined by the reporting laboratory.  This laboratory is certified under the Clinical Laboratory Improvement Amendments CLIA as qualified to perform high complexity clinical laboratory testing.    MEROPENEM Value in next row Sensitive      <=4 SENSITIVEThis is a modified FDA-approved test that has been validated and its performance characteristics determined by the reporting laboratory.  This laboratory is certified under the Clinical Laboratory Improvement Amendments CLIA as qualified to perform high complexity clinical laboratory testing.    * >=100,000 COLONIES/mL ACINETOBACTER CALCOACETICUS/BAUMANNII COMPLEX   Enterobacter species - MIC*    CEFEPIME  Value in next row Sensitive      <=4 SENSITIVEThis is a modified FDA-approved test that has been validated and its performance characteristics determined by the reporting laboratory.  This laboratory is certified under the Clinical Laboratory Improvement Amendments CLIA as qualified to perform high complexity clinical laboratory testing.    ERTAPENEM Value in next row Resistant      <=4 SENSITIVEThis is a modified FDA-approved test that has been validated and its performance characteristics determined by the reporting laboratory.  This laboratory is certified under the Clinical Laboratory Improvement Amendments CLIA as qualified to perform high complexity clinical laboratory testing.    GENTAMICIN Value in next row Sensitive      <=4 SENSITIVEThis is a modified FDA-approved test that has been validated and its performance characteristics determined by the reporting laboratory.  This laboratory is certified under the Clinical Laboratory Improvement Amendments CLIA as qualified to perform high complexity clinical laboratory testing.    NITROFURANTOIN Value in next  row Sensitive      <=4 SENSITIVEThis is a modified FDA-approved test that has been validated and its performance characteristics determined by the reporting laboratory.  This  laboratory is certified under the Clinical Laboratory Improvement Amendments CLIA as qualified to perform high complexity clinical laboratory testing.    TRIMETH /SULFA  Value in next row Resistant      <=4 SENSITIVEThis is a modified FDA-approved test that has been validated and its performance characteristics determined by the reporting laboratory.  This laboratory is certified under the Clinical Laboratory Improvement Amendments CLIA as qualified to perform high complexity clinical laboratory testing.    PIP/TAZO Value in next row Resistant      >=128 RESISTANTThis is a modified FDA-approved test that has been validated and its performance characteristics determined by the reporting laboratory.  This laboratory is certified under the Clinical Laboratory Improvement Amendments CLIA as qualified to perform high complexity clinical laboratory testing.    MEROPENEM Value in next row Sensitive      >=128 RESISTANTThis is a modified FDA-approved test that has been validated and its performance characteristics determined by the reporting laboratory.  This laboratory is certified under the Clinical Laboratory Improvement Amendments CLIA as qualified to perform high complexity clinical laboratory testing.    * 80,000 COLONIES/mL ENTEROBACTER SPECIES    [x]  Treated with ciprofloxacin  500 mg BID for 7 days. New urine cultures were collected 07/19/2024 and pending results.   New antibiotic prescription: Await new urine culture results to guide treatment adjustments.   ED Provider: Susette Lash, PA.   Damien Henry PharmD Candidate Class of 2026  [facility]   07/20/2024, 8:49 AM  Monday - Friday phone -  [phone] Saturday - Sunday phone - [phone]

## 2024-07-23 LAB — URINE CULTURE: Culture: 20000 — AB

## 2024-07-24 ENCOUNTER — Telehealth (HOSPITAL_BASED_OUTPATIENT_CLINIC_OR_DEPARTMENT_OTHER): Payer: Self-pay

## 2024-07-24 NOTE — Telephone Encounter (Signed)
 Post ED Visit - Positive Culture Follow-up  Culture report reviewed by antimicrobial stewardship pharmacist: [facility] []  Rankin Dee, Pharm.D. []  Venetia Gully, Pharm.D., BCPS AQ-ID []  Garrel Crews, Pharm.D., BCPS []  Almarie Lunger, Pharm.D., BCPS []  [place], [place].D., BCPS, AAHIVP []  Rosaline Bihari, Pharm.D., BCPS, AAHIVP []  Vernell Meier, PharmD, BCPS []  Latanya Hint, PharmD, BCPS []  Johnwilliam Medley, PharmD, BCPS []  Rocky Bold, PharmD []  Dorothyann Alert, PharmD, BCPS []  Morene Babe, PharmD  [facility] []  Rosaline Edison, PharmD []  Romona Bliss, PharmD []  Dolphus Roller, PharmD []  Veva Seip, Rph []  Vernell Daunt) Leonce, PharmD []  Eva Allis, PharmD []  Rosaline Millet, PharmD []  Iantha Batch, PharmD []  Arvin Gauss, PharmD [x]  Wanda Hasting, PharmD []  Ronal Rav, PharmD []  Rocky Slade, PharmD []  Bard Jeans, PharmD   Positive urine culture  Leaking foley, foley upsized  with resolution of leaking. Recent UTI on Cipro . No treatment needed and no further patient follow-up is required at this time.  Ruth Camelia Elbe 07/24/2024, 3:26 PM

## 2024-09-01 ENCOUNTER — Emergency Department (HOSPITAL_COMMUNITY)
Admission: EM | Admit: 2024-09-01 | Discharge: 2024-09-01 | Disposition: A | Discharge status: Home / Self Care | Attending: Emergency Medicine | Admitting: Emergency Medicine

## 2024-09-01 ENCOUNTER — Other Ambulatory Visit: Payer: Self-pay

## 2024-09-01 ENCOUNTER — Encounter (HOSPITAL_COMMUNITY): Payer: Self-pay

## 2024-09-01 DIAGNOSIS — Y732 Prosthetic and other implants, materials and accessory gastroenterology and urology devices associated with adverse incidents: Secondary | ICD-10-CM | POA: Diagnosis not present

## 2024-09-01 DIAGNOSIS — Z7984 Long term (current) use of oral hypoglycemic drugs: Secondary | ICD-10-CM | POA: Insufficient documentation

## 2024-09-01 DIAGNOSIS — T83091A Other mechanical complication of indwelling urethral catheter, initial encounter: Secondary | ICD-10-CM | POA: Diagnosis present

## 2024-09-01 DIAGNOSIS — E119 Type 2 diabetes mellitus without complications: Secondary | ICD-10-CM | POA: Diagnosis not present

## 2024-09-01 DIAGNOSIS — N3001 Acute cystitis with hematuria: Secondary | ICD-10-CM | POA: Insufficient documentation

## 2024-09-01 DIAGNOSIS — Z794 Long term (current) use of insulin: Secondary | ICD-10-CM | POA: Diagnosis not present

## 2024-09-01 DIAGNOSIS — Z79899 Other long term (current) drug therapy: Secondary | ICD-10-CM | POA: Diagnosis not present

## 2024-09-01 LAB — CBG MONITORING, ED: Glucose-Capillary: 95 mg/dL (ref 70–99)

## 2024-09-01 LAB — URINALYSIS, ROUTINE W REFLEX MICROSCOPIC
Bilirubin Urine: NEGATIVE
Glucose, UA: NEGATIVE mg/dL
Ketones, ur: NEGATIVE mg/dL
Nitrite: NEGATIVE
Protein, ur: 30 mg/dL — AB
RBC / HPF: >50 RBC/hpf (ref 0–5)
Specific Gravity, Urine: 1.008 (ref 1.005–1.030)
pH: 6 (ref 5.0–8.0)

## 2024-09-01 MED ORDER — CEPHALEXIN 500 MG PO CAPS: 500.0000 mg | ORAL_CAPSULE | Freq: Four times a day (QID) | ORAL | 0 refills | Status: AC

## 2024-09-01 NOTE — ED Notes (Signed)
 [facility] contacted for pt transport back to his home

## 2024-09-01 NOTE — ED Triage Notes (Signed)
 Pt bib EMS pt reports catheter fell out and he urinated on himself. A&Ox4. Denies pain. Pt is amputee L leg.

## 2024-09-01 NOTE — ED Provider Notes (Addendum)
 [facility] AT [facility] Provider Note   CSN: [id number] Arrival date & time: 09/01/24  1534     Patient presents with: Catheter issue   Leslie [name] is a 78 y.o. male.   78 year old male presents ED with complaints of catheter problem.  He reports it fell out today by accident.  He is not sure how the catheter fell out.  He has had a catheter for several years.  He is seen by urology as well as [employer]  He advises he cannot control his urination that is why he is on the catheter.  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  Patient reports diabetes is well-controlled with insulin  and metformin  at this time.  Patient advises that he has noticed foul odors to his urine but has no other complaints at this time.     Prior to Admission medications   Medication Sig Start Date End Date Taking? Authorizing Provider  cephALEXin  (KEFLEX ) 500 MG capsule Take 1 capsule (500 mg total) by mouth 4 (four) times daily. 09/01/24  Yes Myriam Fonda RAMAN, PA-C  acetaminophen  (TYLENOL ) 325 MG tablet Take 2 tablets (650 mg total) by mouth every 6 (six) hours as needed for mild pain (or Fever >/= 101). Patient taking differently: Take 650 mg by mouth every 8 (eight) hours as needed for mild pain (or Fever >/= 101). 03/30/18   Seena Marsa NOVAK, MD  albuterol  (PROVENTIL  HFA;VENTOLIN  HFA) 108 (90 Base) MCG/ACT inhaler Inhale 2 puffs into the lungs 4 (four) times daily as needed for wheezing or shortness of breath.     [provider]  albuterol  (PROVENTIL ) (2.5 MG/3ML) 0.083% nebulizer solution Take 2.5 mg by nebulization every 6 (six) hours as needed for wheezing or shortness of breath.    [provider]  allopurinol  (ZYLOPRIM ) 100 MG tablet Take 100 mg by mouth at bedtime. For gout    [provider]  AMINO ACIDS-PROTEIN HYDROLYS PO Take 1 Dose by mouth 2 (two) times daily. Add SF  Pro-Stat    [provider]  amLODipine  (NORVASC ) 10 MG tablet Take 1 tablet (10 mg total) by mouth daily. 08/09/12   Funches, Josalyn, MD  ascorbic acid  (VITAMIN C) 1000 MG tablet Take 1 tablet (1,000 mg total) by mouth daily. 09/14/21   Nguyen, Quan, DO  calcium  carbonate (TUMS EX) 750 MG chewable tablet Chew 1 tablet by mouth 2 (two) times daily as needed for heartburn.    [provider]  cephALEXin  (KEFLEX ) 500 MG capsule Take 1 capsule (500 mg total) by mouth 4 (four) times daily. 03/24/24   Laurice Maude BROCKS, MD  Cholecalciferol  (VITAMIN D -3) 1000 units CAPS Take 1,000 Units by mouth daily.    [provider]  ciprofloxacin  (CIPRO ) 500 MG tablet Take 1 tablet (500 mg total) by mouth 2 (two) times daily. 04/12/24   Dean Clarity, MD  ciprofloxacin  (CIPRO ) 500 MG tablet Take 1 tablet (500 mg total) by mouth 2 (two) times daily. 07/15/24   Dean Clarity, MD  escitalopram  (LEXAPRO ) 10 MG tablet Take 10 mg by mouth daily.    [provider]  Evolocumab with Infusor (REPATHA PUSHTRONEX SYSTEM) 420 MG/3.5ML SOCT Inject 420 mg into the skin every 30 (thirty) days.    [provider]  ferrous sulfate 325 (65 FE) MG tablet Take 325 mg by mouth every other day.    [provider]  finasteride  (PROSCAR ) 5  MG tablet Take 1 tablet by mouth daily. 01/27/22     folic acid  (FOLVITE ) 1 MG tablet Take 1 tablet (1 mg total) by mouth daily. 09/14/21   Nguyen, Quan, DO  hydrOXYzine  (ATARAX ) 25 MG tablet Take 1 to 2 tablets at bedtime for sleep. 12/22/22   Mozingo, Regina Nattalie, NP  insulin  aspart (NOVOLOG ) 100 UNIT/ML injection Inject 0-9 Units into the skin 3 (three) times daily with meals. 10/07/21   Demaio, Alexa, MD  Melatonin 10 MG TABS Take 10 mg by mouth at bedtime.    [provider]  metFORMIN  (GLUCOPHAGE ) 850 MG tablet Take 850 mg by mouth 2 (two) times daily with a meal.    [provider]  Multiple Vitamins-Minerals (CERTAVITE  SENIOR/ANTIOXIDANT) TABS Take 1 tablet by mouth daily.    [provider]  nitroGLYCERIN  (NITROSTAT ) 0.4 MG SL tablet Place 0.4 mg under the tongue every 5 (five) minutes x 3 doses as needed for chest pain.  11/12/11   Funches, Josalyn, MD  oxyCODONE  (OXY IR/ROXICODONE ) 5 MG immediate release tablet Take 1-2 tablets (5-10 mg total) by mouth every 4 (four) hours as needed for moderate pain (pain score 4-6). 09/13/21   Ariwodo, Shirley I, MD  oxyCODONE  10 MG TABS Take 1-1.5 tablets (10-15 mg total) by mouth every 4 (four) hours as needed for severe pain (pain score 7-10). 09/13/21   Ariwodo, Shirley I, MD  pantoprazole  (PROTONIX ) 40 MG tablet Take 1 tablet (40 mg total) by mouth 2 (two) times daily. 10/07/21 12/06/21  Demaio, Alexa, MD  phenazopyridine  (PYRIDIUM ) 200 MG tablet Take 1 tablet (200 mg total) by mouth 3 (three) times daily. 07/15/24   Dean Clarity, MD  pregabalin  (LYRICA ) 75 MG capsule Take 1 capsule (75 mg total) by mouth 2 (two) times daily. 10/24/21   Harden Jerona GAILS, MD  pregabalin  (LYRICA ) 75 MG capsule Take 1 capsule (75 mg total) by mouth 2 (two) times daily. 10/24/21   Harden Jerona GAILS, MD  senna-docusate (SENOKOT-S) 8.6-50 MG tablet Take 2 tablets by mouth daily.    [provider]  Skin Protectants, Misc. (MINERIN) CREA Apply 1 application topically 2 (two) times daily as needed (dryness).    [provider]  sucralfate  (CARAFATE ) 1 g tablet Take 1 tablet (1 g total) by mouth 4 (four) times daily -  with meals and at bedtime. 09/13/21   Nguyen, Quan, DO  tamsulosin  (FLOMAX ) 0.4 MG CAPS capsule Take 1 capsule by mouth at bedtime 10/31/21     traZODone  (DESYREL ) 50 MG tablet Take 50 mg by mouth at bedtime.    [provider]  vitamin B-12 (CYANOCOBALAMIN) 1000 MCG tablet Take 1,000 mcg by mouth daily.    [provider]    Allergies: Atorvastatin , Statins, and Flexeril  [cyclobenzaprine ]    Review of Systems  Genitourinary:  Positive for  dysuria.  All other systems reviewed and are negative.   Updated Vital Signs BP (!) 155/89   Pulse 78   Temp (!) 97.4 F (36.3 C) (Oral)   Resp 16   Ht 5' 4 (1.626 m)   Wt 90.7 kg   SpO2 98%   BMI 34.32 kg/m   Physical Exam  (all labs ordered are listed, but only abnormal results are displayed) Labs Reviewed  URINALYSIS, ROUTINE W REFLEX MICROSCOPIC - Abnormal; Notable for the following components:      Result Value   Color, Urine STRAW (*)    Hgb urine dipstick LARGE (*)    Protein,  ur 30 (*)    Leukocytes,Ua MODERATE (*)    Bacteria, UA RARE (*)    All other components within normal limits  URINE CULTURE  BASIC METABOLIC PANEL WITH GFR  CBC  CBG MONITORING, ED    EKG: None  Radiology: No results found.  Procedures   Medications Ordered in the ED - No data to display  78 y.o. male presents to the ED with complaints of catheter replacement and malodorous urine.  On arrival pt is nontoxic, vitals are unremarkable. Exam significant for removed Foley catheter.  Lab Tests:  I Ordered, reviewed, and interpreted labs, which included: BMP, CBC, UA, POC CBG.  UA was suggestive of UTI with moderate leukocytes and greater than 21 white blood cell.  Culture will be sent.  Patient is complaining of malodorous urine and a burning sensation during urination.  Patient refused any other blood work.   ED Course:   Patient sitting comfortably in ED bed in no acute distress nontoxic-appearing.  Patient is reporting he just wants his catheter replaced but does endorse symptoms of UTI.  Catheter was replaced urine was sent off.  At that time patient refused other blood work.  Patient was advised that we would not be able to further assess if he did not allow us  to do blood work and he still refused.  On reassessment patient reports he felt perfectly fine and has no other complaints.  He was advised that he has a UTI and his urine will be sent for culture and he will be advised of  any findings.  At that time patient was placed on Keflex  and he reported to send it to pharmacy closest to his house.  Patient was advised to follow-up with his [facility] medical provider for further evaluation and recheck this upcoming week.  Patient was given strict return precautions.    Portions of this note were generated with Scientist, clinical (histocompatibility and immunogenetics). Dictation errors may occur despite best attempts at proofreading.   Final diagnoses:  Acute cystitis with hematuria    ED Discharge Orders          Ordered    cephALEXin  (KEFLEX ) 500 MG capsule  4 times daily        09/01/24 1907               Myriam Fonda RAMAN, [place] 09/01/24 1941    Myriam Fonda RAMAN, PA-C 09/01/24 1941    Ruthe Cornet, DO 09/01/24 2007

## 2024-09-01 NOTE — ED Notes (Signed)
 Pt refused blood work, stated he wants to go home

## 2024-09-01 NOTE — Discharge Instructions (Signed)
 Urine was suggestive for UTI.  I sent in a antibiotic for you to take please take it as prescribed.  If you have any worsening or concerning symptoms please return to ED for further evaluation.  Your urine will be sent to a culture and you will be called if there should be any changes to your treatment.  Please follow-up with your healthcare provider for further management and evaluation.

## 2024-09-01 NOTE — ED Notes (Signed)
 18 F straight tip foley inserted

## 2024-09-04 LAB — URINE CULTURE: Culture: >=100000 — AB

## 2024-09-05 ENCOUNTER — Telehealth (HOSPITAL_BASED_OUTPATIENT_CLINIC_OR_DEPARTMENT_OTHER): Payer: Self-pay

## 2024-09-05 NOTE — Telephone Encounter (Signed)
 Post ED Visit - Positive Culture Follow-up: Unsuccessful Patient Follow-up  Culture assessed and recommendations reviewed by:  []  Rankin Dee, Pharm.D. []  Venetia Gully, Pharm.D., BCPS AQ-ID []  Garrel Crews, Pharm.D., BCPS [x]  Almarie Lunger, Pharm.D., BCPS []  [place], [address].D., BCPS, AAHIVP []  Rosaline Bihari, Pharm.D., BCPS, AAHIVP []  Massie Rigg, PharmD []  Jodie Rower, PharmD, BCPS  Positive urine culture  []  Patient discharged without antimicrobial prescription and treatment is now indicated [x]  Organism is resistant to prescribed ED discharge antimicrobial []  Patient with positive blood cultures   plan: call to stop Cephalexin  if no s/s no abx needed if having s/s start Levoquin 500 mg po Q day x 5 days per ED provider Lavonia Pat, MD   Unable to contact patient after 3 attempts, letter will be sent to address on file  Ruth Camelia Elbe 09/05/2024, 1:45 PM

## 2024-09-05 NOTE — Progress Notes (Signed)
 ED Antimicrobial Stewardship Positive Culture Follow Up   Leslie [name] is an 78 y.o. male who presented to [facility] on 09/01/2024 with a chief complaint of  Chief Complaint  Patient presents with   Catheter issue    Recent Results (from the past 720 hours)  Urine Culture     Status: Abnormal   Collection Time: 09/01/24  6:20 PM   Specimen: Urine, Clean Catch  Result Value Ref Range Status   Specimen Description   Final    URINE, CLEAN CATCH Performed at [facility], [address] [place], [place] [address]    Special Requests   Final    NONE Performed at [facility], [address], [place], [place] [address]    Culture >=100,000 COLONIES/mL ENTEROBACTER SPECIES (A)  Final   Report Status 09/04/2024 FINAL  Final   Organism ID, Bacteria ENTEROBACTER SPECIES (A)  Final      Susceptibility   Enterobacter species - MIC*    CEFEPIME  <=0.12 SENSITIVE Sensitive     ERTAPENEM <=0.12 SENSITIVE Sensitive     GENTAMICIN <=1 SENSITIVE Sensitive     NITROFURANTOIN 64 INTERMEDIATE Intermediate     TRIMETH /SULFA  >=320 RESISTANT Resistant     PIP/TAZO Value in next row Sensitive      8 SENSITIVEThis is a modified FDA-approved test that has been validated and its performance characteristics determined by the reporting laboratory.  This laboratory is certified under the Clinical Laboratory Improvement Amendments CLIA as qualified to perform high complexity clinical laboratory testing.    MEROPENEM Value in next row Sensitive      8 SENSITIVEThis is a modified FDA-approved test that has been validated and its performance characteristics determined by the reporting laboratory.  This laboratory is certified under the Clinical Laboratory Improvement Amendments CLIA as qualified to perform high complexity clinical laboratory testing.    * >=100,000 COLONIES/mL ENTEROBACTER SPECIES    [x]  Treated with cephalexin , organism resistant to prescribed  antimicrobial  [name] admit s/p foley falling out requiring replacement. The patient did describe some burning with urination, afebrile, no CBC checked.  Called micro to check on options for fluoroquinolones. Cipro  is resistant and Levaquin is intermediate with MIC of 1.  Call to STOP cephalexin  and for a symptom check: - If improved/resolved - no additional antibiotics needed - If having urinary symptoms - start levofloxacin 500 mg po daily for 5 days  ED Provider: Lavonia Pat, MD  Thank you for allowing pharmacy to be a part of this patient's care.  Almarie Lunger, PharmD, BCPS, BCIDP Infectious Diseases Clinical Pharmacist 09/05/2024 11:56 AM   **Pharmacist phone directory can now be found on [email or web address] ([email or web address]).  Listed under [facility].

## 2024-09-08 ENCOUNTER — Encounter (HOSPITAL_COMMUNITY): Payer: Self-pay

## 2024-09-08 ENCOUNTER — Emergency Department (HOSPITAL_COMMUNITY)
Admission: EM | Admit: 2024-09-08 | Discharge: 2024-09-09 | Disposition: A | Discharge status: Home / Self Care | Attending: Emergency Medicine | Admitting: Emergency Medicine

## 2024-09-08 DIAGNOSIS — Z794 Long term (current) use of insulin: Secondary | ICD-10-CM | POA: Diagnosis not present

## 2024-09-08 DIAGNOSIS — T83098A Other mechanical complication of other indwelling urethral catheter, initial encounter: Secondary | ICD-10-CM | POA: Diagnosis present

## 2024-09-08 DIAGNOSIS — Y732 Prosthetic and other implants, materials and accessory gastroenterology and urology devices associated with adverse incidents: Secondary | ICD-10-CM | POA: Insufficient documentation

## 2024-09-08 DIAGNOSIS — T839XXA Unspecified complication of genitourinary prosthetic device, implant and graft, initial encounter: Secondary | ICD-10-CM

## 2024-09-08 MED ORDER — LIDOCAINE VISCOUS HCL 2 % MT SOLN: 10.0000 mL | OROMUCOSAL | 0 refills | Status: AC | PRN

## 2024-09-08 MED ORDER — LIDOCAINE HCL URETHRAL/MUCOSAL 2 % EX GEL
1.0000 | Freq: Once | CUTANEOUS | Status: AC
  Administered 2024-09-08: 1 via URETHRAL
  Filled 2024-09-08: qty 11

## 2024-09-08 NOTE — Discharge Instructions (Signed)
 A referral has been made to home health services, including social work.  Those individuals should be able to help you with your medications, and assisting with follow-up.  Return here for concerning changes in your condition.

## 2024-09-08 NOTE — ED Triage Notes (Addendum)
 Pt BIB EMS from home. Catheter placed 2 weeks ago, and it came out today @ 3pm. Now pt is urinating on himself and would it liked to be re placed and fixed. Called his dr and they recommended him to come to hospital.  Upon examination, catheter appears to be in place, however pt pants are soaked in urine.   EMS vitals  BP 160/96  HR 82 SPO2 99 RA

## 2024-09-08 NOTE — ED Provider Notes (Signed)
 [place] EMERGENCY DEPARTMENT AT [facility] Provider Note   CSN: [id number] Arrival date & time: 09/08/24  1656     Patient presents with: No chief complaint on file.   Leslie [name] is a 77 y.o. male.   HPI Patient presents with concern of Foley catheter dysfunction. Patient is awake, alert, providing own history, denies other complaints, pain, fall. He notes that he has been unable to follow-up with urology.  Over the past 2 days he has noticed urine coming around the catheter itself, though there is also fluid in the collection bag.     Prior to Admission medications   Medication Sig Start Date End Date Taking? Authorizing Provider  lidocaine  (XYLOCAINE ) 2 % solution Use as directed 10 mLs in the mouth or throat as needed (pain at tip of penis). 09/08/24  Yes Garrick Charleston, MD  acetaminophen  (TYLENOL ) 325 MG tablet Take 2 tablets (650 mg total) by mouth every 6 (six) hours as needed for mild pain (or Fever >/= 101). Patient taking differently: Take 650 mg by mouth every 8 (eight) hours as needed for mild pain (or Fever >/= 101). 03/30/18   Melvin, Alexander B, MD  albuterol  (PROVENTIL  HFA;VENTOLIN  HFA) 108 (90 Base) MCG/ACT inhaler Inhale 2 puffs into the lungs 4 (four) times daily as needed for wheezing or shortness of breath.     [provider]  albuterol  (PROVENTIL ) (2.5 MG/3ML) 0.083% nebulizer solution Take 2.5 mg by nebulization every 6 (six) hours as needed for wheezing or shortness of breath.    [provider]  allopurinol  (ZYLOPRIM ) 100 MG tablet Take 100 mg by mouth at bedtime. For gout    [provider]  AMINO ACIDS-PROTEIN HYDROLYS PO Take 1 Dose by mouth 2 (two) times daily. Add SF Pro-Stat    [provider]  amLODipine  (NORVASC ) 10 MG tablet Take 1 tablet (10 mg total) by mouth daily. 08/09/12   Funches, Josalyn, MD  ascorbic acid  (VITAMIN C) 1000 MG tablet Take 1 tablet (1,000 mg total) by mouth daily. 09/14/21    Nguyen, Quan, DO  calcium  carbonate (TUMS EX) 750 MG chewable tablet Chew 1 tablet by mouth 2 (two) times daily as needed for heartburn.    [provider]  cephALEXin  (KEFLEX ) 500 MG capsule Take 1 capsule (500 mg total) by mouth 4 (four) times daily. 03/24/24   Laurice Maude BROCKS, MD  cephALEXin  (KEFLEX ) 500 MG capsule Take 1 capsule (500 mg total) by mouth 4 (four) times daily. 09/01/24   Myriam Fonda RAMAN, PA-C  Cholecalciferol  (VITAMIN D -3) 1000 units CAPS Take 1,000 Units by mouth daily.    [provider]  ciprofloxacin  (CIPRO ) 500 MG tablet Take 1 tablet (500 mg total) by mouth 2 (two) times daily. 04/12/24   Dean Clarity, MD  ciprofloxacin  (CIPRO ) 500 MG tablet Take 1 tablet (500 mg total) by mouth 2 (two) times daily. 07/15/24   Dean Clarity, MD  escitalopram  (LEXAPRO ) 10 MG tablet Take 10 mg by mouth daily.    [provider]  Evolocumab with Infusor (REPATHA PUSHTRONEX SYSTEM) 420 MG/3.5ML SOCT Inject 420 mg into the skin every 30 (thirty) days.    [provider]  ferrous sulfate 325 (65 FE) MG tablet Take 325 mg by mouth every other day.    [provider]  finasteride  (PROSCAR ) 5 MG tablet Take 1 tablet by mouth daily. 01/27/22     folic acid  (FOLVITE ) 1 MG tablet Take 1 tablet (1 mg total) by  mouth daily. 09/14/21   Nguyen, Quan, DO  hydrOXYzine  (ATARAX ) 25 MG tablet Take 1 to 2 tablets at bedtime for sleep. 12/22/22   Mozingo, Regina Nattalie, NP  insulin  aspart (NOVOLOG ) 100 UNIT/ML injection Inject 0-9 Units into the skin 3 (three) times daily with meals. 10/07/21   Demaio, Alexa, MD  Melatonin 10 MG TABS Take 10 mg by mouth at bedtime.    [provider]  metFORMIN  (GLUCOPHAGE ) 850 MG tablet Take 850 mg by mouth 2 (two) times daily with a meal.    [provider]  Multiple Vitamins-Minerals (CERTAVITE SENIOR/ANTIOXIDANT) TABS Take 1 tablet by mouth daily.    [provider]  nitroGLYCERIN  (NITROSTAT ) 0.4 MG SL  tablet Place 0.4 mg under the tongue every 5 (five) minutes x 3 doses as needed for chest pain.  11/12/11   Funches, Josalyn, MD  oxyCODONE  (OXY IR/ROXICODONE ) 5 MG immediate release tablet Take 1-2 tablets (5-10 mg total) by mouth every 4 (four) hours as needed for moderate pain (pain score 4-6). 09/13/21   Ariwodo, Shirley I, MD  oxyCODONE  10 MG TABS Take 1-1.5 tablets (10-15 mg total) by mouth every 4 (four) hours as needed for severe pain (pain score 7-10). 09/13/21   Ariwodo, Shirley I, MD  pantoprazole  (PROTONIX ) 40 MG tablet Take 1 tablet (40 mg total) by mouth 2 (two) times daily. 10/07/21 12/06/21  Demaio, Alexa, MD  phenazopyridine  (PYRIDIUM ) 200 MG tablet Take 1 tablet (200 mg total) by mouth 3 (three) times daily. 07/15/24   Dean Clarity, MD  pregabalin  (LYRICA ) 75 MG capsule Take 1 capsule (75 mg total) by mouth 2 (two) times daily. 10/24/21   Harden Jerona GAILS, MD  pregabalin  (LYRICA ) 75 MG capsule Take 1 capsule (75 mg total) by mouth 2 (two) times daily. 10/24/21   Harden Jerona GAILS, MD  senna-docusate (SENOKOT-S) 8.6-50 MG tablet Take 2 tablets by mouth daily.    [provider]  Skin Protectants, Misc. (MINERIN) CREA Apply 1 application topically 2 (two) times daily as needed (dryness).    [provider]  sucralfate  (CARAFATE ) 1 g tablet Take 1 tablet (1 g total) by mouth 4 (four) times daily -  with meals and at bedtime. 09/13/21   Nguyen, Quan, DO  tamsulosin  (FLOMAX ) 0.4 MG CAPS capsule Take 1 capsule by mouth at bedtime 10/31/21     traZODone  (DESYREL ) 50 MG tablet Take 50 mg by mouth at bedtime.    [provider]  vitamin B-12 (CYANOCOBALAMIN) 1000 MCG tablet Take 1,000 mcg by mouth daily.    [provider]    Allergies: Atorvastatin , Statins, and Flexeril  [cyclobenzaprine ]    Review of Systems  Updated Vital Signs BP (!) 154/93 (BP Location: Left Arm)   Pulse 78   Temp 97.9 F (36.6 C) (Oral)   Resp 16   Ht 1.626 m (5' 4)   Wt 90.7 kg    SpO2 97%   BMI 34.32 kg/m   Physical Exam Vitals and nursing note reviewed.  Constitutional:      General: He is not in acute distress.    Appearance: He is well-developed. He is ill-appearing.     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  HENT:     Head: Normocephalic and atraumatic.  Eyes:     Conjunctiva/sclera: Conjunctivae normal.  Cardiovascular:     Rate and Rhythm: Normal rate and regular rhythm.  Pulmonary:     Effort: Pulmonary effort is normal. No respiratory distress.  Breath sounds: No stridor.  Abdominal:     General: There is no distension.     Tenderness: There is no abdominal tenderness. There is no guarding.  Genitourinary:    Penis: Normal.      Comments: Foley catheter in place Skin:    General: Skin is warm and dry.  Neurological:     Mental Status: He is alert and oriented to person, place, and time.     (all labs ordered are listed, but only abnormal results are displayed) Labs Reviewed - No data to display  EKG: None  Radiology: No results found.   Procedures   Medications Ordered in the ED  lidocaine  (XYLOCAINE ) 2 % jelly 1 Application (1 Application Urethral Given 09/08/24 1857)                                    Medical Decision Making Elderly male with Foley catheter due to prior acute urinary retention presents with leakage around the catheter itself.  Patient is awake, alert, in no distress, afebrile, no hypotension, no evidence for infection. Catheter assessed, replaced with the assistance of our nursing colleagues, patient monitored for hours without decompensation, again no evidence for bacteremia, sepsis.  Patient's social challenges noted, home health orders placed, patient discharged in stable condition.  Amount and/or Complexity of Data Reviewed External Data Reviewed: notes.  Risk Decision regarding hospitalization. Diagnosis or treatment significantly limited by  social determinants of health.    Final diagnoses:  Complication of Foley catheter, initial encounter    ED Discharge Orders          Ordered    lidocaine  (XYLOCAINE ) 2 % solution  As needed        09/08/24 2111    Home Health        09/08/24 2112    Face-to-face encounter (required for Medicare/Medicaid patients)       Comments: I Lamar Salen certify that this patient is under my care and that I, or a nurse practitioner or physician's assistant working with me, had a face-to-face encounter that meets the physician face-to-face encounter requirements with this patient on 09/08/2024. The encounter with the patient was in whole, or in part for the following medical condition(s) which is the primary reason for home health care (List medical condition): additional orders per PMD   09/08/24 2112               Salen Lamar, MD 09/08/24 2113

## 2024-11-01 ENCOUNTER — Other Ambulatory Visit: Payer: Self-pay

## 2024-11-01 ENCOUNTER — Encounter (HOSPITAL_COMMUNITY): Payer: Self-pay

## 2024-11-01 ENCOUNTER — Emergency Department (HOSPITAL_COMMUNITY)
Admission: EM | Admit: 2024-11-01 | Discharge: 2024-11-01 | Disposition: A | Discharge status: Home / Self Care | Source: Ambulatory Visit | Attending: Emergency Medicine | Admitting: Emergency Medicine

## 2024-11-01 DIAGNOSIS — J069 Acute upper respiratory infection, unspecified: Secondary | ICD-10-CM | POA: Insufficient documentation

## 2024-11-01 DIAGNOSIS — Z794 Long term (current) use of insulin: Secondary | ICD-10-CM | POA: Insufficient documentation

## 2024-11-01 DIAGNOSIS — Z79899 Other long term (current) drug therapy: Secondary | ICD-10-CM | POA: Diagnosis not present

## 2024-11-01 DIAGNOSIS — J449 Chronic obstructive pulmonary disease, unspecified: Secondary | ICD-10-CM | POA: Diagnosis not present

## 2024-11-01 DIAGNOSIS — I10 Essential (primary) hypertension: Secondary | ICD-10-CM | POA: Diagnosis not present

## 2024-11-01 DIAGNOSIS — Z7951 Long term (current) use of inhaled steroids: Secondary | ICD-10-CM | POA: Insufficient documentation

## 2024-11-01 DIAGNOSIS — T839XXA Unspecified complication of genitourinary prosthetic device, implant and graft, initial encounter: Secondary | ICD-10-CM

## 2024-11-01 DIAGNOSIS — T83098A Other mechanical complication of other indwelling urethral catheter, initial encounter: Secondary | ICD-10-CM | POA: Diagnosis not present

## 2024-11-01 DIAGNOSIS — E119 Type 2 diabetes mellitus without complications: Secondary | ICD-10-CM | POA: Diagnosis not present

## 2024-11-01 DIAGNOSIS — R059 Cough, unspecified: Secondary | ICD-10-CM | POA: Diagnosis present

## 2024-11-01 DIAGNOSIS — Z7984 Long term (current) use of oral hypoglycemic drugs: Secondary | ICD-10-CM | POA: Insufficient documentation

## 2024-11-01 MED ORDER — BENZONATATE 100 MG PO CAPS
100.0000 mg | ORAL_CAPSULE | Freq: Once | ORAL | Status: AC
  Administered 2024-11-01: 100 mg via ORAL
  Filled 2024-11-01: qty 1

## 2024-11-01 MED ORDER — BENZONATATE 100 MG PO CAPS: 100.0000 mg | ORAL_CAPSULE | Freq: Three times a day (TID) | ORAL | 0 refills | Status: AC

## 2024-11-01 MED ORDER — ALBUTEROL SULFATE HFA 108 (90 BASE) MCG/ACT IN AERS
2.0000 | INHALATION_SPRAY | RESPIRATORY_TRACT | Status: DC | PRN
  Filled 2024-11-01: qty 6.7

## 2024-11-01 MED ORDER — BENZONATATE 100 MG PO CAPS: 100.0000 mg | ORAL_CAPSULE | Freq: Three times a day (TID) | ORAL | 0 refills | Status: DC

## 2024-11-01 NOTE — ED Triage Notes (Signed)
 BIB EMS from PCP, pt went to have foley cath changed, office does not change them. Pt got upset, left then returned and called EMS to come bring him to the hospital. [id number]% RA CBG 222

## 2024-11-01 NOTE — ED Provider Notes (Signed)
 " [place] EMERGENCY DEPARTMENT AT [facility] Provider Note   CSN: [id number] Arrival date & time: 11/01/24  1709     Patient presents with: Needs Catheter Change   Leslie [name] is a 79 y.o. male.   Patient is a 79 year old male with a history of hypertension, hyperlipidemia, substance abuse, diabetes, COPD who is presenting today with the request of getting his Foley catheter changed.  He reports he last had it changed at the end of November and he was trying to go to his doctor to get it changed today but they report that he was not able to get it done there so he came to the emergency room.  He states to just today that he started having burning at the meatus.  He has still had output in the bag and denies any abdominal pain.  He also reports today he woke up and he has had congestion and a cough but denies any shortness of breath and does not think he has had a fever.  He reports in the past that he has had inhalers but he is not sure if he has one at home now.  He has not had a productive cough.  The history is provided by the patient.       Prior to Admission medications  Medication Sig Start Date End Date Taking? Authorizing Provider  benzonatate  (TESSALON ) 100 MG capsule Take 1 capsule (100 mg total) by mouth every 8 (eight) hours. 11/01/24  Yes Doretha Folks, MD  acetaminophen  (TYLENOL ) 325 MG tablet Take 2 tablets (650 mg total) by mouth every 6 (six) hours as needed for mild pain (or Fever >/= 101). Patient taking differently: Take 650 mg by mouth every 8 (eight) hours as needed for mild pain (or Fever >/= 101). 03/30/18   Melvin, Alexander B, MD  albuterol  (PROVENTIL  HFA;VENTOLIN  HFA) 108 (90 Base) MCG/ACT inhaler Inhale 2 puffs into the lungs 4 (four) times daily as needed for wheezing or shortness of breath.     [provider]  albuterol  (PROVENTIL ) (2.5 MG/3ML) 0.083% nebulizer solution Take 2.5 mg by nebulization every 6 (six) hours as needed for  wheezing or shortness of breath.    [provider]  allopurinol  (ZYLOPRIM ) 100 MG tablet Take 100 mg by mouth at bedtime. For gout    [provider]  AMINO ACIDS-PROTEIN HYDROLYS PO Take 1 Dose by mouth 2 (two) times daily. Add SF Pro-Stat    [provider]  amLODipine  (NORVASC ) 10 MG tablet Take 1 tablet (10 mg total) by mouth daily. 08/09/12   Funches, Josalyn, MD  ascorbic acid  (VITAMIN C) 1000 MG tablet Take 1 tablet (1,000 mg total) by mouth daily. 09/14/21   Nguyen, Quan, DO  calcium  carbonate (TUMS EX) 750 MG chewable tablet Chew 1 tablet by mouth 2 (two) times daily as needed for heartburn.    [provider]  cephALEXin  (KEFLEX ) 500 MG capsule Take 1 capsule (500 mg total) by mouth 4 (four) times daily. 03/24/24   Laurice Maude BROCKS, MD  cephALEXin  (KEFLEX ) 500 MG capsule Take 1 capsule (500 mg total) by mouth 4 (four) times daily. 09/01/24   Myriam Fonda RAMAN, PA-C  Cholecalciferol  (VITAMIN D -3) 1000 units CAPS Take 1,000 Units by mouth daily.    [provider]  ciprofloxacin  (CIPRO ) 500 MG tablet Take 1 tablet (500 mg total) by mouth 2 (two) times daily. 04/12/24   Dean Clarity, MD  ciprofloxacin  (CIPRO ) 500 MG tablet Take 1  tablet (500 mg total) by mouth 2 (two) times daily. 07/15/24   Dean Clarity, MD  escitalopram  (LEXAPRO ) 10 MG tablet Take 10 mg by mouth daily.    [provider]  Evolocumab with Infusor (REPATHA PUSHTRONEX SYSTEM) 420 MG/3.5ML SOCT Inject 420 mg into the skin every 30 (thirty) days.    [provider]  ferrous sulfate 325 (65 FE) MG tablet Take 325 mg by mouth every other day.    [provider]  finasteride  (PROSCAR ) 5 MG tablet Take 1 tablet by mouth daily. 01/27/22     folic acid  (FOLVITE ) 1 MG tablet Take 1 tablet (1 mg total) by mouth daily. 09/14/21   Nguyen, Quan, DO  hydrOXYzine  (ATARAX ) 25 MG tablet Take 1 to 2 tablets at bedtime for sleep. 12/22/22   Mozingo, Regina Nattalie, NP   insulin  aspart (NOVOLOG ) 100 UNIT/ML injection Inject 0-9 Units into the skin 3 (three) times daily with meals. 10/07/21   Demaio, Alexa, MD  lidocaine  (XYLOCAINE ) 2 % solution Use as directed 10 mLs in the mouth or throat as needed (pain at tip of penis). 09/08/24   Garrick Charleston, MD  Melatonin 10 MG TABS Take 10 mg by mouth at bedtime.    [provider]  metFORMIN  (GLUCOPHAGE ) 850 MG tablet Take 850 mg by mouth 2 (two) times daily with a meal.    [provider]  Multiple Vitamins-Minerals (CERTAVITE SENIOR/ANTIOXIDANT) TABS Take 1 tablet by mouth daily.    [provider]  nitroGLYCERIN  (NITROSTAT ) 0.4 MG SL tablet Place 0.4 mg under the tongue every 5 (five) minutes x 3 doses as needed for chest pain.  11/12/11   Funches, Josalyn, MD  oxyCODONE  (OXY IR/ROXICODONE ) 5 MG immediate release tablet Take 1-2 tablets (5-10 mg total) by mouth every 4 (four) hours as needed for moderate pain (pain score 4-6). 09/13/21   Ariwodo, Shirley I, MD  oxyCODONE  10 MG TABS Take 1-1.5 tablets (10-15 mg total) by mouth every 4 (four) hours as needed for severe pain (pain score 7-10). 09/13/21   Ariwodo, Shirley I, MD  pantoprazole  (PROTONIX ) 40 MG tablet Take 1 tablet (40 mg total) by mouth 2 (two) times daily. 10/07/21 12/06/21  Demaio, Alexa, MD  phenazopyridine  (PYRIDIUM ) 200 MG tablet Take 1 tablet (200 mg total) by mouth 3 (three) times daily. 07/15/24   Dean Clarity, MD  pregabalin  (LYRICA ) 75 MG capsule Take 1 capsule (75 mg total) by mouth 2 (two) times daily. 10/24/21   Harden Jerona GAILS, MD  pregabalin  (LYRICA ) 75 MG capsule Take 1 capsule (75 mg total) by mouth 2 (two) times daily. 10/24/21   Harden Jerona GAILS, MD  senna-docusate (SENOKOT-S) 8.6-50 MG tablet Take 2 tablets by mouth daily.    [provider]  Skin Protectants, Misc. (MINERIN) CREA Apply 1 application topically 2 (two) times daily as needed (dryness).    [provider]  sucralfate  (CARAFATE ) 1 g  tablet Take 1 tablet (1 g total) by mouth 4 (four) times daily -  with meals and at bedtime. 09/13/21   Nguyen, Quan, DO  tamsulosin  (FLOMAX ) 0.4 MG CAPS capsule Take 1 capsule by mouth at bedtime 10/31/21     traZODone  (DESYREL ) 50 MG tablet Take 50 mg by mouth at bedtime.    [provider]  vitamin B-12 (CYANOCOBALAMIN) 1000 MCG tablet Take 1,000 mcg by mouth daily.    [provider]    Allergies: Atorvastatin , Statins, and Flexeril  [cyclobenzaprine ]    Review of Systems  Updated Vital Signs BP 132/81   Pulse 84   Temp 97.7 F (36.5 C) (Oral)   Resp 15   Ht 5' 4 (1.626 m)   Wt 90 kg   SpO2 100%   BMI 34.06 kg/m   Physical Exam Vitals and nursing note reviewed.  Constitutional:      General: He is not in acute distress.    Appearance: He is well-developed.  HENT:     Head: Normocephalic and atraumatic.     Nose: Congestion present.     Mouth/Throat:     Mouth: Mucous membranes are moist.  Eyes:     Conjunctiva/sclera: Conjunctivae normal.     Pupils: Pupils are equal, round, and reactive to light.  Cardiovascular:     Rate and Rhythm: Normal rate and regular rhythm.     Heart sounds: No murmur heard. Pulmonary:     Effort: Pulmonary effort is normal. No respiratory distress.     Breath sounds: Normal breath sounds. No wheezing or rales.     Comments: Coughing on exam, minimal wheezing Abdominal:     General: There is no distension.     Palpations: Abdomen is soft.     Tenderness: There is no abdominal tenderness. There is no guarding or rebound.  Genitourinary:    Comments: Foley catheter present and draining urine Musculoskeletal:        General: No tenderness. Normal range of motion.     Cervical back: Normal range of motion and neck supple.  Skin:    General: Skin is warm and dry.     Findings: No erythema or rash.  Neurological:     Mental Status: He is alert and oriented to person, place, and time.  Psychiatric:        Behavior:  Behavior normal.     (all labs ordered are listed, but only abnormal results are displayed) Labs Reviewed - No data to display  EKG: None  Radiology: No results found.   Procedures   Medications Ordered in the ED  albuterol  (VENTOLIN  HFA) 108 (90 Base) MCG/ACT inhaler 2 puff (has no administration in time range)  benzonatate  (TESSALON ) capsule 100 mg (100 mg Oral Given 11/01/24 1753)                                    Medical Decision Making Risk Prescription drug management.   Patient presenting today wanting to have his Foley catheter changed.  Is describing a little bit of burning at the meatus but denies any abdominal pain or fever and low suspicion for an acute UTI at this time.  Will change out patient's Foley as it was last changed at the end of November. Secondly patient is complaining of URI symptoms.  Has some minimal wheezing on exam and was given an inhaler and Tessalon  Perles.  Is not having fever suggestive of flu at this time.     Final diagnoses:  Foley catheter problem, initial encounter  Viral upper respiratory tract infection    ED Discharge Orders          Ordered    benzonatate  (TESSALON ) 100 MG capsule  Every 8 hours        11/01/24 1908               Doretha Folks, MD 11/01/24 1909  "

## 2024-11-01 NOTE — Discharge Instructions (Signed)
 Use your inhaler as needed for the cough.  You can also get the pills that help with cough but you could also try honey at home.

## 2024-11-28 ENCOUNTER — Emergency Department (HOSPITAL_COMMUNITY)
Admission: EM | Admit: 2024-11-28 | Discharge: 2024-11-28 | Disposition: A | Discharge status: Home / Self Care | Attending: Emergency Medicine | Admitting: Emergency Medicine

## 2024-11-28 ENCOUNTER — Emergency Department (HOSPITAL_COMMUNITY)
Admission: EM | Admit: 2024-11-28 | Discharge: 2024-11-29 | Disposition: A | Attending: Emergency Medicine | Admitting: Emergency Medicine

## 2024-11-28 ENCOUNTER — Other Ambulatory Visit: Payer: Self-pay

## 2024-11-28 ENCOUNTER — Encounter (HOSPITAL_COMMUNITY): Payer: Self-pay

## 2024-11-28 ENCOUNTER — Emergency Department (HOSPITAL_COMMUNITY)

## 2024-11-28 DIAGNOSIS — M542 Cervicalgia: Secondary | ICD-10-CM | POA: Insufficient documentation

## 2024-11-28 DIAGNOSIS — Z89511 Acquired absence of right leg below knee: Secondary | ICD-10-CM | POA: Insufficient documentation

## 2024-11-28 DIAGNOSIS — K219 Gastro-esophageal reflux disease without esophagitis: Secondary | ICD-10-CM

## 2024-11-28 DIAGNOSIS — F17200 Nicotine dependence, unspecified, uncomplicated: Secondary | ICD-10-CM | POA: Insufficient documentation

## 2024-11-28 DIAGNOSIS — R053 Chronic cough: Secondary | ICD-10-CM | POA: Insufficient documentation

## 2024-11-28 DIAGNOSIS — Z5982 Transportation insecurity: Secondary | ICD-10-CM | POA: Insufficient documentation

## 2024-11-28 DIAGNOSIS — W050XXA Fall from non-moving wheelchair, initial encounter: Secondary | ICD-10-CM | POA: Insufficient documentation

## 2024-11-28 DIAGNOSIS — Z748 Other problems related to care provider dependency: Secondary | ICD-10-CM

## 2024-11-28 DIAGNOSIS — W19XXXA Unspecified fall, initial encounter: Secondary | ICD-10-CM

## 2024-11-28 DIAGNOSIS — R519 Headache, unspecified: Secondary | ICD-10-CM | POA: Insufficient documentation

## 2024-11-28 DIAGNOSIS — M549 Dorsalgia, unspecified: Secondary | ICD-10-CM | POA: Insufficient documentation

## 2024-11-28 DIAGNOSIS — Z794 Long term (current) use of insulin: Secondary | ICD-10-CM | POA: Insufficient documentation

## 2024-11-28 DIAGNOSIS — R7309 Other abnormal glucose: Secondary | ICD-10-CM | POA: Insufficient documentation

## 2024-11-28 DIAGNOSIS — J449 Chronic obstructive pulmonary disease, unspecified: Secondary | ICD-10-CM | POA: Insufficient documentation

## 2024-11-28 DIAGNOSIS — Z89512 Acquired absence of left leg below knee: Secondary | ICD-10-CM | POA: Insufficient documentation

## 2024-11-28 DIAGNOSIS — M545 Low back pain, unspecified: Secondary | ICD-10-CM | POA: Insufficient documentation

## 2024-11-28 DIAGNOSIS — M546 Pain in thoracic spine: Secondary | ICD-10-CM | POA: Insufficient documentation

## 2024-11-28 MED ORDER — OXYCODONE HCL 5 MG PO TABS
10.0000 mg | ORAL_TABLET | Freq: Once | ORAL | Status: AC
  Administered 2024-11-28: 10 mg via ORAL
  Filled 2024-11-28: qty 2

## 2024-11-28 NOTE — ED Notes (Signed)
 Patient transported to X-ray

## 2024-11-28 NOTE — ED Notes (Signed)
 [facility] called for transport home.

## 2024-11-28 NOTE — ED Notes (Signed)
 Patient transported to CT

## 2024-11-28 NOTE — ED Provider Notes (Signed)
 " [place] [facility] Provider Note   CSN: [id number] Arrival date & time: 11/28/24  1311     Patient presents with: Fall and Neck Pain   Leslie [name] is a 79 y.o. male.   HPI 79 year old male presents with pain.  He fell out of his wheelchair 1 week ago.  He hit his head and has been having headache and neck pain and low back pain ever since.  He denies any new weakness or numbness.  He has not been sick otherwise including vomiting, fever, chest pain, shortness of breath, etc.  He has not been take anything for pain because he is running out of his chronic oxycodone .  He usually takes 10 mg.  He states that he is having some right posterior buttock pain from the fall as well.  Prior to Admission medications  Medication Sig Start Date End Date Taking? Authorizing Provider  acetaminophen  (TYLENOL ) 325 MG tablet Take 2 tablets (650 mg total) by mouth every 6 (six) hours as needed for mild pain (or Fever >/= 101). Patient taking differently: Take 650 mg by mouth every 8 (eight) hours as needed for mild pain (or Fever >/= 101). 03/30/18   Melvin, Alexander B, MD  albuterol  (PROVENTIL  HFA;VENTOLIN  HFA) 108 (90 Base) MCG/ACT inhaler Inhale 2 puffs into the lungs 4 (four) times daily as needed for wheezing or shortness of breath.     [provider]  albuterol  (PROVENTIL ) (2.5 MG/3ML) 0.083% nebulizer solution Take 2.5 mg by nebulization every 6 (six) hours as needed for wheezing or shortness of breath.    [provider]  allopurinol  (ZYLOPRIM ) 100 MG tablet Take 100 mg by mouth at bedtime. For gout    [provider]  AMINO ACIDS-PROTEIN HYDROLYS PO Take 1 Dose by mouth 2 (two) times daily. Add SF Pro-Stat    [provider]  amLODipine  (NORVASC ) 10 MG tablet Take 1 tablet (10 mg total) by mouth daily. 08/09/12   Funches, Josalyn, MD  ascorbic acid  (VITAMIN C) 1000 MG tablet Take 1 tablet (1,000 mg total) by mouth daily.  09/14/21   Nguyen, Quan, DO  benzonatate  (TESSALON ) 100 MG capsule Take 1 capsule (100 mg total) by mouth every 8 (eight) hours. 11/01/24   Zelaya, Oscar A, PA-C  calcium  carbonate (TUMS EX) 750 MG chewable tablet Chew 1 tablet by mouth 2 (two) times daily as needed for heartburn.    [provider]  cephALEXin  (KEFLEX ) 500 MG capsule Take 1 capsule (500 mg total) by mouth 4 (four) times daily. 03/24/24   Laurice Maude BROCKS, MD  cephALEXin  (KEFLEX ) 500 MG capsule Take 1 capsule (500 mg total) by mouth 4 (four) times daily. 09/01/24   Myriam Fonda RAMAN, PA-C  Cholecalciferol  (VITAMIN D -3) 1000 units CAPS Take 1,000 Units by mouth daily.    [provider]  ciprofloxacin  (CIPRO ) 500 MG tablet Take 1 tablet (500 mg total) by mouth 2 (two) times daily. 04/12/24   Dean Clarity, MD  ciprofloxacin  (CIPRO ) 500 MG tablet Take 1 tablet (500 mg total) by mouth 2 (two) times daily. 07/15/24   Dean Clarity, MD  escitalopram  (LEXAPRO ) 10 MG tablet Take 10 mg by mouth daily.    [provider]  Evolocumab with Infusor (REPATHA PUSHTRONEX SYSTEM) 420 MG/3.5ML SOCT Inject 420 mg into the skin every 30 (thirty) days.    [provider]  ferrous sulfate 325 (65 FE) MG tablet Take 325 mg by mouth every other day.  [provider]  finasteride  (PROSCAR ) 5 MG tablet Take 1 tablet by mouth daily. 01/27/22     folic acid  (FOLVITE ) 1 MG tablet Take 1 tablet (1 mg total) by mouth daily. 09/14/21   Nguyen, Quan, DO  hydrOXYzine  (ATARAX ) 25 MG tablet Take 1 to 2 tablets at bedtime for sleep. 12/22/22   Mozingo, Regina Nattalie, NP  insulin  aspart (NOVOLOG ) 100 UNIT/ML injection Inject 0-9 Units into the skin 3 (three) times daily with meals. 10/07/21   Demaio, Alexa, MD  lidocaine  (XYLOCAINE ) 2 % solution Use as directed 10 mLs in the mouth or throat as needed (pain at tip of penis). 09/08/24   Garrick Charleston, MD  Melatonin 10 MG TABS Take 10 mg by mouth at bedtime.    [provider]  metFORMIN  (GLUCOPHAGE ) 850 MG tablet Take 850 mg by mouth 2 (two) times daily with a meal.    [provider]  Multiple Vitamins-Minerals (CERTAVITE SENIOR/ANTIOXIDANT) TABS Take 1 tablet by mouth daily.    [provider]  nitroGLYCERIN  (NITROSTAT ) 0.4 MG SL tablet Place 0.4 mg under the tongue every 5 (five) minutes x 3 doses as needed for chest pain.  11/12/11   Funches, Josalyn, MD  oxyCODONE  (OXY IR/ROXICODONE ) 5 MG immediate release tablet Take 1-2 tablets (5-10 mg total) by mouth every 4 (four) hours as needed for moderate pain (pain score 4-6). 09/13/21   Ariwodo, Shirley I, MD  oxyCODONE  10 MG TABS Take 1-1.5 tablets (10-15 mg total) by mouth every 4 (four) hours as needed for severe pain (pain score 7-10). 09/13/21   Ariwodo, Shirley I, MD  pantoprazole  (PROTONIX ) 40 MG tablet Take 1 tablet (40 mg total) by mouth 2 (two) times daily. 10/07/21 12/06/21  Demaio, Alexa, MD  phenazopyridine  (PYRIDIUM ) 200 MG tablet Take 1 tablet (200 mg total) by mouth 3 (three) times daily. 07/15/24   Dean Clarity, MD  pregabalin  (LYRICA ) 75 MG capsule Take 1 capsule (75 mg total) by mouth 2 (two) times daily. 10/24/21   Harden Jerona GAILS, MD  pregabalin  (LYRICA ) 75 MG capsule Take 1 capsule (75 mg total) by mouth 2 (two) times daily. 10/24/21   Harden Jerona GAILS, MD  senna-docusate (SENOKOT-S) 8.6-50 MG tablet Take 2 tablets by mouth daily.    [provider]  Skin Protectants, Misc. (MINERIN) CREA Apply 1 application topically 2 (two) times daily as needed (dryness).    [provider]  sucralfate  (CARAFATE ) 1 g tablet Take 1 tablet (1 g total) by mouth 4 (four) times daily -  with meals and at bedtime. 09/13/21   Nguyen, Quan, DO  tamsulosin  (FLOMAX ) 0.4 MG CAPS capsule Take 1 capsule by mouth at bedtime 10/31/21     traZODone  (DESYREL ) 50 MG tablet Take 50 mg by mouth at bedtime.    [provider]  vitamin B-12 (CYANOCOBALAMIN) 1000 MCG tablet Take 1,000  mcg by mouth daily.    [provider]    Allergies: Atorvastatin , Statins, and Flexeril  [cyclobenzaprine ]    Review of Systems  Respiratory:  Negative for shortness of breath.   Cardiovascular:  Negative for chest pain.  Gastrointestinal:  Negative for abdominal pain.  Musculoskeletal:  Positive for back pain and neck pain.  Neurological:  Positive for headaches. Negative for weakness and numbness.    Updated Vital Signs BP (!) 168/64   Pulse 82   Temp 98.1 F (36.7 C) (Oral)   Resp 16   SpO2 97%   Physical Exam Vitals and nursing  note reviewed.  Constitutional:      Appearance: He is well-developed.  HENT:     Head: Normocephalic and atraumatic.  Neck:   Pulmonary:     Effort: Pulmonary effort is normal.  Abdominal:     General: There is no distension.     Palpations: Abdomen is soft.     Tenderness: There is no abdominal tenderness.  Musculoskeletal:     Cervical back: Tenderness present. No rigidity. Muscular tenderness present. No spinous process tenderness.     Thoracic back: Tenderness present.     Lumbar back: Tenderness present.       Back:     Right hip: No tenderness. Normal range of motion.     Left hip: No tenderness. Normal range of motion.     Right Lower Extremity: Right leg is amputated above knee.     Left Lower Extremity: Left leg is amputated above knee.  Skin:    General: Skin is warm and dry.  Neurological:     Mental Status: He is alert.     Comments: No facial droop, clear speech, equal strength in all 4 extremities.     (all labs ordered are listed, but only abnormal results are displayed) Labs Reviewed - No data to display  EKG: None  Radiology: No results found.   Procedures   Medications Ordered in the ED  oxyCODONE  (Oxy IR/ROXICODONE ) immediate release tablet 10 mg (has no administration in time range)                                    Medical Decision Making Amount and/or Complexity of Data  Reviewed Radiology: ordered.  Risk Prescription drug management.   Patient has equal strength in all 4 extremities.  No red flags from a neuro perspective.  No hip tenderness but more of a buttocks pain.  Will get x-rays and CTs.  Workup pending, care transferred to Dr. Elnor.     Final diagnoses:  None    ED Discharge Orders     None          Freddi Hamilton, MD 11/28/24 1550  "

## 2024-11-28 NOTE — ED Provider Notes (Addendum)
 3:28 PM Patient signed out to me by previous ED physician. 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  Also complaints for being out of home pain meds-oxy with chronic pain.  Physical exam stable from a trauma standpoint.  Minimal bony tenderness.  CT scans pending.  Oxy given in ED.  P: Imaging pending. Likely go home if stable.   Physical Exam  BP (!) 168/64   Pulse 82   Temp 98.1 F (36.7 C) (Oral)   Resp 16   SpO2 97%   Physical Exam  Procedures  Procedures  ED Course / MDM    Medical Decision Making Amount and/or Complexity of Data Reviewed Radiology: ordered.  Risk Prescription drug management.   Pelvic, thoracic, and lumbar xray stable.  CT head and neck demonstrates no acute process.  He was given oxycodone  when he first came in.  He states he is feeling much better he is ready to be discharged home.  Patient's friend Channing called expressing concerns for chronic cough.  Patient is an active smoker previously diagnosed with COPD.  On exam he has equal bilateral breath sounds with minimal wheezing and no adventitious lung sounds.  No respiratory distress, hypoxia, or tachypnea.  He is being treated by his primary care physician with as needed albuterol .  Patient had a CT chest in 2020.  I recommend close follow-up with PCP for annual CT screening since he is still smoking.  He also has a history of GERD and is taking lifetime Protonix .  Recommend close follow-up with his PCP for chronic cough.  Otherwise no new complaints at this time.  No fevers, chills, or URI symptoms outside of his chronic cough.   Patient in no distress and overall condition improved here in the ED. Detailed discussions were had with the patient regarding current findings, and need for close f/u with PCP or on call doctor. The patient has been instructed to return immediately if the symptoms worsen in any way for re-evaluation. Patient verbalized understanding and  is in agreement with current care plan. All questions answered prior to discharge.      Elnor Hila P, DO 11/28/24 2317

## 2024-11-28 NOTE — ED Notes (Addendum)
 Patient reports falling onto floor after his right extremity was caught between motorized wheelchair and wall. No LOC but had to wait approximately 2 hours for assistance from the floor. His care provider is here admitted to [facility] and he is trying to do the best he can. Confirmed no other assistance is available no home health comes. Patient states neck pain x 1 week rates 8/10. Patient mentioned right hip discomfort but states, it is my neck that needs looking at. Alert and oriented without noted obvious distress, able to speak in full, clear and coherent sentences without noted difficulty. Patient reports not wanting to use his home prescribed pain medication until he really needs them. No OTC medication taken for reported neck pain.

## 2024-11-29 LAB — CBG MONITORING, ED
Glucose-Capillary: 171 mg/dL — ABNORMAL HIGH (ref 70–99)
Glucose-Capillary: 82 mg/dL (ref 70–99)

## 2024-11-29 MED ORDER — ALBUTEROL SULFATE HFA 108 (90 BASE) MCG/ACT IN AERS: 2.0000 | INHALATION_SPRAY | Freq: Four times a day (QID) | RESPIRATORY_TRACT | Status: DC | PRN

## 2024-11-29 MED ORDER — FOLIC ACID 1 MG PO TABS: 1.0000 mg | ORAL_TABLET | Freq: Every day | ORAL | Status: DC

## 2024-11-29 MED ORDER — PREGABALIN 25 MG PO CAPS
75.0000 mg | ORAL_CAPSULE | Freq: Two times a day (BID) | ORAL | Status: DC
  Administered 2024-11-29: 75 mg via ORAL
  Filled 2024-11-29: qty 3

## 2024-11-29 MED ORDER — TRAZODONE HCL 50 MG PO TABS
50.0000 mg | ORAL_TABLET | Freq: Every day | ORAL | Status: DC
  Administered 2024-11-29: 50 mg via ORAL
  Filled 2024-11-29: qty 1

## 2024-11-29 MED ORDER — AMLODIPINE BESYLATE 5 MG PO TABS: 10.0000 mg | ORAL_TABLET | Freq: Every day | ORAL | Status: DC

## 2024-11-29 MED ORDER — INSULIN ASPART 100 UNIT/ML IJ SOLN: 0.0000 [IU] | Freq: Three times a day (TID) | INTRAMUSCULAR | Status: DC

## 2024-11-29 MED ORDER — BENZONATATE 100 MG PO CAPS
100.0000 mg | ORAL_CAPSULE | Freq: Three times a day (TID) | ORAL | Status: DC
  Administered 2024-11-29: 100 mg via ORAL
  Filled 2024-11-29: qty 1

## 2024-11-29 MED ORDER — ALLOPURINOL 100 MG PO TABS
100.0000 mg | ORAL_TABLET | Freq: Every day | ORAL | Status: DC
  Administered 2024-11-29: 100 mg via ORAL
  Filled 2024-11-29: qty 1

## 2024-11-29 MED ORDER — ESCITALOPRAM OXALATE 10 MG PO TABS: 10.0000 mg | ORAL_TABLET | Freq: Every day | ORAL | Status: DC

## 2024-11-29 MED ORDER — HYDROXYZINE HCL 25 MG PO TABS: 25.0000 mg | ORAL_TABLET | Freq: Every evening | ORAL | Status: DC | PRN

## 2024-11-29 MED ORDER — MELATONIN 5 MG PO TABS
10.0000 mg | ORAL_TABLET | Freq: Every day | ORAL | Status: DC
  Administered 2024-11-29: 10 mg via ORAL
  Filled 2024-11-29: qty 2

## 2024-11-29 MED ORDER — PANTOPRAZOLE SODIUM 40 MG PO TBEC
40.0000 mg | DELAYED_RELEASE_TABLET | Freq: Two times a day (BID) | ORAL | Status: DC
  Administered 2024-11-29: 40 mg via ORAL
  Filled 2024-11-29: qty 1

## 2024-11-29 MED ORDER — SENNOSIDES-DOCUSATE SODIUM 8.6-50 MG PO TABS: 2.0000 | ORAL_TABLET | Freq: Every day | ORAL | Status: DC

## 2024-11-29 MED ORDER — METFORMIN HCL 850 MG PO TABS
850.0000 mg | ORAL_TABLET | Freq: Two times a day (BID) | ORAL | Status: DC
  Administered 2024-11-29: 850 mg via ORAL
  Filled 2024-11-29 (×2): qty 1

## 2024-11-29 MED ORDER — SUCRALFATE 1 G PO TABS: 1.0000 g | ORAL_TABLET | Freq: Three times a day (TID) | ORAL | Status: DC
